# Patient Record
Sex: Female | Born: 1965 | Race: White | Hispanic: No | Marital: Single | Smoking: Former smoker
Health system: Southern US, Community
[De-identification: ages and names within clinical notes are randomized; demographics above are authoritative.]

## PROBLEM LIST (undated history)

## (undated) DIAGNOSIS — K909 Intestinal malabsorption, unspecified: Secondary | ICD-10-CM

## (undated) DIAGNOSIS — IMO0002 Reserved for concepts with insufficient information to code with codable children: Secondary | ICD-10-CM

## (undated) DIAGNOSIS — D508 Other iron deficiency anemias: Secondary | ICD-10-CM

## (undated) DIAGNOSIS — J189 Pneumonia, unspecified organism: Secondary | ICD-10-CM

## (undated) DIAGNOSIS — C799 Secondary malignant neoplasm of unspecified site: Secondary | ICD-10-CM

## (undated) DIAGNOSIS — IMO0001 Reserved for inherently not codable concepts without codable children: Secondary | ICD-10-CM

## (undated) DIAGNOSIS — I201 Angina pectoris with documented spasm: Secondary | ICD-10-CM

## (undated) DIAGNOSIS — Z8489 Family history of other specified conditions: Secondary | ICD-10-CM

## (undated) DIAGNOSIS — C801 Malignant (primary) neoplasm, unspecified: Secondary | ICD-10-CM

## (undated) DIAGNOSIS — M1990 Unspecified osteoarthritis, unspecified site: Secondary | ICD-10-CM

## (undated) DIAGNOSIS — J45909 Unspecified asthma, uncomplicated: Secondary | ICD-10-CM

## (undated) DIAGNOSIS — C787 Secondary malignant neoplasm of liver and intrahepatic bile duct: Secondary | ICD-10-CM

## (undated) DIAGNOSIS — Z923 Personal history of irradiation: Secondary | ICD-10-CM

## (undated) DIAGNOSIS — G4730 Sleep apnea, unspecified: Secondary | ICD-10-CM

## (undated) DIAGNOSIS — K219 Gastro-esophageal reflux disease without esophagitis: Secondary | ICD-10-CM

## (undated) DIAGNOSIS — N390 Urinary tract infection, site not specified: Secondary | ICD-10-CM

## (undated) DIAGNOSIS — T7840XA Allergy, unspecified, initial encounter: Secondary | ICD-10-CM

## (undated) DIAGNOSIS — Z9889 Other specified postprocedural states: Secondary | ICD-10-CM

## (undated) DIAGNOSIS — I10 Essential (primary) hypertension: Secondary | ICD-10-CM

## (undated) DIAGNOSIS — R112 Nausea with vomiting, unspecified: Secondary | ICD-10-CM

## (undated) DIAGNOSIS — C50919 Malignant neoplasm of unspecified site of unspecified female breast: Secondary | ICD-10-CM

## (undated) HISTORY — DX: Personal history of irradiation: Z92.3

## (undated) HISTORY — DX: Intestinal malabsorption, unspecified: K90.9

## (undated) HISTORY — PX: SINUS SURGERY WITH INSTATRAK: SHX5215

## (undated) HISTORY — PX: TONSILLECTOMY: SUR1361

## (undated) HISTORY — DX: Angina pectoris with documented spasm: I20.1

## (undated) HISTORY — PX: CARDIAC CATHETERIZATION: SHX172

## (undated) HISTORY — PX: KNEE SURGERY: SHX244

## (undated) HISTORY — PX: TUBAL LIGATION: SHX77

## (undated) HISTORY — PX: TENDON REPAIR: SHX5111

## (undated) HISTORY — DX: Other iron deficiency anemias: D50.8

## (undated) HISTORY — PX: BREAST BIOPSY WITH SENTINEL LYMPH NODE BIOPSY AND NEEDLE LOCALIZATION: SHX5761

## (undated) HISTORY — DX: Unspecified osteoarthritis, unspecified site: M19.90

## (undated) HISTORY — DX: Unspecified asthma, uncomplicated: J45.909

## (undated) HISTORY — DX: Allergy, unspecified, initial encounter: T78.40XA

## (undated) HISTORY — PX: CHOLECYSTECTOMY: SHX55

## (undated) HISTORY — DX: Essential (primary) hypertension: I10

## (undated) HISTORY — PX: WISDOM TOOTH EXTRACTION: SHX21

---

## 2000-02-21 ENCOUNTER — Encounter: Payer: Self-pay | Admitting: Emergency Medicine

## 2000-02-21 ENCOUNTER — Emergency Department (HOSPITAL_COMMUNITY): Admission: EM | Admit: 2000-02-21 | Discharge: 2000-02-21 | Payer: Self-pay | Admitting: Emergency Medicine

## 2000-03-05 ENCOUNTER — Emergency Department (HOSPITAL_COMMUNITY): Admission: EM | Admit: 2000-03-05 | Discharge: 2000-03-05 | Payer: Self-pay | Admitting: Emergency Medicine

## 2000-03-27 ENCOUNTER — Other Ambulatory Visit: Admission: RE | Admit: 2000-03-27 | Discharge: 2000-03-27 | Payer: Self-pay | Admitting: Obstetrics & Gynecology

## 2000-11-10 ENCOUNTER — Emergency Department (HOSPITAL_COMMUNITY): Admission: EM | Admit: 2000-11-10 | Discharge: 2000-11-10 | Payer: Self-pay | Admitting: Emergency Medicine

## 2000-11-20 ENCOUNTER — Encounter: Admission: RE | Admit: 2000-11-20 | Discharge: 2001-02-18 | Payer: Self-pay | Admitting: Specialist

## 2003-04-08 ENCOUNTER — Other Ambulatory Visit: Admission: RE | Admit: 2003-04-08 | Discharge: 2003-04-08 | Payer: Self-pay | Admitting: Obstetrics & Gynecology

## 2004-04-20 ENCOUNTER — Other Ambulatory Visit: Admission: RE | Admit: 2004-04-20 | Discharge: 2004-04-20 | Payer: Self-pay | Admitting: Obstetrics & Gynecology

## 2004-08-05 ENCOUNTER — Ambulatory Visit (HOSPITAL_COMMUNITY): Admission: RE | Admit: 2004-08-05 | Discharge: 2004-08-05 | Payer: Self-pay | Admitting: Obstetrics & Gynecology

## 2004-11-09 ENCOUNTER — Emergency Department (HOSPITAL_COMMUNITY): Admission: EM | Admit: 2004-11-09 | Discharge: 2004-11-09 | Payer: Self-pay | Admitting: Emergency Medicine

## 2004-11-09 ENCOUNTER — Ambulatory Visit (HOSPITAL_COMMUNITY): Admission: RE | Admit: 2004-11-09 | Discharge: 2004-11-09 | Payer: Self-pay | Admitting: Emergency Medicine

## 2005-04-26 ENCOUNTER — Other Ambulatory Visit: Admission: RE | Admit: 2005-04-26 | Discharge: 2005-04-26 | Payer: Self-pay | Admitting: Obstetrics & Gynecology

## 2006-02-07 ENCOUNTER — Ambulatory Visit (HOSPITAL_COMMUNITY): Admission: RE | Admit: 2006-02-07 | Discharge: 2006-02-07 | Payer: Self-pay | Admitting: Specialist

## 2006-04-06 ENCOUNTER — Ambulatory Visit (HOSPITAL_BASED_OUTPATIENT_CLINIC_OR_DEPARTMENT_OTHER): Admission: RE | Admit: 2006-04-06 | Discharge: 2006-04-06 | Payer: Self-pay | Admitting: Specialist

## 2006-10-23 HISTORY — PX: COLONOSCOPY W/ POLYPECTOMY: SHX1380

## 2007-01-31 ENCOUNTER — Ambulatory Visit (HOSPITAL_COMMUNITY): Admission: RE | Admit: 2007-01-31 | Discharge: 2007-01-31 | Payer: Self-pay | Admitting: Cardiology

## 2007-03-05 ENCOUNTER — Ambulatory Visit (HOSPITAL_COMMUNITY): Admission: RE | Admit: 2007-03-05 | Discharge: 2007-03-05 | Payer: Self-pay | Admitting: Gastroenterology

## 2007-03-05 ENCOUNTER — Encounter (INDEPENDENT_AMBULATORY_CARE_PROVIDER_SITE_OTHER): Payer: Self-pay | Admitting: Specialist

## 2007-08-15 ENCOUNTER — Inpatient Hospital Stay (HOSPITAL_COMMUNITY): Admission: EM | Admit: 2007-08-15 | Discharge: 2007-08-16 | Payer: Self-pay | Admitting: Emergency Medicine

## 2009-05-03 ENCOUNTER — Inpatient Hospital Stay (HOSPITAL_COMMUNITY): Admission: EM | Admit: 2009-05-03 | Discharge: 2009-05-04 | Payer: Self-pay | Admitting: Emergency Medicine

## 2009-06-01 ENCOUNTER — Ambulatory Visit (HOSPITAL_COMMUNITY): Admission: RE | Admit: 2009-06-01 | Discharge: 2009-06-01 | Payer: Self-pay | Admitting: Cardiology

## 2009-06-24 ENCOUNTER — Ambulatory Visit (HOSPITAL_BASED_OUTPATIENT_CLINIC_OR_DEPARTMENT_OTHER): Admission: RE | Admit: 2009-06-24 | Discharge: 2009-06-24 | Payer: Self-pay | Admitting: Cardiology

## 2009-07-02 ENCOUNTER — Ambulatory Visit: Payer: Self-pay | Admitting: Internal Medicine

## 2009-09-15 ENCOUNTER — Ambulatory Visit (HOSPITAL_COMMUNITY): Admission: RE | Admit: 2009-09-15 | Discharge: 2009-09-16 | Payer: Self-pay | Admitting: Otolaryngology

## 2009-09-17 ENCOUNTER — Encounter (INDEPENDENT_AMBULATORY_CARE_PROVIDER_SITE_OTHER): Payer: Self-pay | Admitting: Otolaryngology

## 2010-03-31 ENCOUNTER — Encounter: Admission: RE | Admit: 2010-03-31 | Discharge: 2010-03-31 | Payer: Self-pay | Admitting: Cardiology

## 2011-01-25 LAB — CBC
Hemoglobin: 15.7 g/dL — ABNORMAL HIGH (ref 12.0–15.0)
Platelets: 274 10*3/uL (ref 150–400)
RBC: 4.83 MIL/uL (ref 3.87–5.11)
RDW: 13.2 % (ref 11.5–15.5)
WBC: 12.6 10*3/uL — ABNORMAL HIGH (ref 4.0–10.5)

## 2011-01-25 LAB — BASIC METABOLIC PANEL
Calcium: 9.6 mg/dL (ref 8.4–10.5)
GFR calc Af Amer: >60 mL/min (ref >60)
GFR calc non Af Amer: 58 mL/min — ABNORMAL LOW (ref >60)
Sodium: 138 mEq/L (ref 135–145)

## 2011-01-29 LAB — BASIC METABOLIC PANEL
BUN: 14 mg/dL (ref 6–23)
BUN: 15 mg/dL (ref 6–23)
CO2: 27 mEq/L (ref 19–32)
Calcium: 10 mg/dL (ref 8.4–10.5)
Chloride: 104 mEq/L (ref 96–112)
Creatinine, Ser: 0.88 mg/dL (ref 0.4–1.2)
GFR calc Af Amer: >60 mL/min (ref >60)
GFR calc non Af Amer: >60 mL/min (ref >60)
Glucose, Bld: 105 mg/dL — ABNORMAL HIGH (ref 70–99)
Glucose, Bld: 98 mg/dL (ref 70–99)
Potassium: 3.7 mEq/L (ref 3.5–5.1)

## 2011-01-29 LAB — HEPARIN LEVEL (UNFRACTIONATED): Heparin Unfractionated: <0.1 IU/mL — ABNORMAL LOW (ref 0.30–0.70)

## 2011-01-29 LAB — CBC
HCT: 40.4 % (ref 36.0–46.0)
Hemoglobin: 15.6 g/dL — ABNORMAL HIGH (ref 12.0–15.0)
MCHC: 34.3 g/dL (ref 30.0–36.0)
MCHC: 34.8 g/dL (ref 30.0–36.0)
MCV: 92.3 fL (ref 78.0–100.0)
Platelets: 287 10*3/uL (ref 150–400)
Platelets: 302 10*3/uL (ref 150–400)
RDW: 12.6 % (ref 11.5–15.5)

## 2011-01-29 LAB — COMPREHENSIVE METABOLIC PANEL
AST: 19 U/L (ref 0–37)
Albumin: 4 g/dL (ref 3.5–5.2)
Calcium: 8.7 mg/dL (ref 8.4–10.5)
GFR calc Af Amer: >60 mL/min (ref >60)
Glucose, Bld: 92 mg/dL (ref 70–99)
Potassium: 3.1 mEq/L — ABNORMAL LOW (ref 3.5–5.1)
Sodium: 138 mEq/L (ref 135–145)
Total Bilirubin: 0.6 mg/dL (ref 0.3–1.2)

## 2011-01-29 LAB — CK TOTAL AND CKMB (NOT AT ARMC): Total CK: 70 U/L (ref 7–177)

## 2011-01-29 LAB — DIFFERENTIAL
Eosinophils Relative: 2 % (ref 0–5)
Lymphs Abs: 2.9 10*3/uL (ref 0.7–4.0)
Monocytes Relative: 7 % (ref 3–12)

## 2011-01-29 LAB — LIPID PANEL
Cholesterol: 205 mg/dL — ABNORMAL HIGH (ref 0–200)
LDL Cholesterol: 121 mg/dL — ABNORMAL HIGH (ref 0–99)
Triglycerides: 269 mg/dL — ABNORMAL HIGH (ref <150)
VLDL: 54 mg/dL — ABNORMAL HIGH (ref 0–40)

## 2011-01-29 LAB — POCT CARDIAC MARKERS: Myoglobin, poc: 47.7 ng/mL (ref 12–200)

## 2011-01-29 LAB — MAGNESIUM: Magnesium: 2.2 mg/dL (ref 1.5–2.5)

## 2011-01-29 LAB — TROPONIN I
Troponin I: 0.02 ng/mL (ref 0.00–0.06)
Troponin I: 0.02 ng/mL (ref 0.00–0.06)

## 2011-01-29 LAB — TSH: TSH: 2.792 u[IU]/mL (ref 0.350–4.500)

## 2011-03-07 ENCOUNTER — Encounter (HOSPITAL_BASED_OUTPATIENT_CLINIC_OR_DEPARTMENT_OTHER)
Admission: RE | Admit: 2011-03-07 | Discharge: 2011-03-07 | Disposition: A | Discharge status: Home / Self Care | Payer: 59 | Source: Ambulatory Visit | Attending: Orthopedic Surgery | Admitting: Orthopedic Surgery

## 2011-03-07 LAB — BASIC METABOLIC PANEL
BUN: 9 mg/dL (ref 6–23)
CO2: 22 mEq/L (ref 19–32)
Chloride: 103 mEq/L (ref 96–112)
Creatinine, Ser: 0.65 mg/dL (ref 0.4–1.2)

## 2011-03-07 NOTE — Cardiovascular Report (Signed)
Bianca Kent, Bianca Kent               ACCOUNT NO.:  [id number]   MEDICAL RECORD NO.:  [id number]          PATIENT TYPE:  INP   LOCATION:  2022                         FACILITY:  [facility]   PHYSICIAN:  Darlin Priestly, MD  DATE OF BIRTH:  19-Sep-1966   DATE OF PROCEDURE:  08/15/2007  DATE OF DISCHARGE:                            CARDIAC CATHETERIZATION   PROCEDURE:  1. Left heart catheterization.  2. Coronary angiography.  3. Left ventriculography.   COMPLICATIONS:  None.   INDICATION:  Ms. Frier is a 45 year old female patient of Dr. Lavonne Chick with a history of anxiety, mild asthma, history of a coronary  vasospasm documented by catheterization in June 2008.  She has been on  Norvasc, though she continues to have intermittent episodes of chest  pain which is nitroglycerin responsive.  She was subsequently admitted  on August 11, 2007, again with recurrent chest pain.  She was  subsequently ruled out for myocardial infarction.  She did undergo CT  scan to rule out PE which did reveal a 4-mm nodule in the right mid lung  field with suggestion for repeat CT in 6-9 months.  She has undergone  cardiac catheterization by Dr. Elsie Lincoln in June 2008 that revealed  noncritical CAD but she was noted have coronary vasospasm that time.  She is now brought for repeat catheterization to reassess her coronary  anatomy.   DESCRIPTION OF PROCEDURE:  After giving informed consent the patient was  brought to the cardiac catheterization laboratory.  The right groin was  shaved, prepped and draped in a sterile fashion.  ECG monitoring was  established.  Using the modified Seldinger technique, a #6-French  arterial sheath was inserted in the right femoral artery.  Next, a 4-  [place] JL-3.5 diagnostic catheter was used to perform left coronary  angiogram.   The left main is a medium-size short vessel with no significant disease.   The LAD is a medium to large vessel which coursed the apex and gives  rise to one diagonal branch.  The LAD has no significant disease.   The first diagonal is a medium-size vessel which bifurcates distally  with no significant disease.   The left circumflex is a medium-size vessel coursing the AV groove and  gives rise to two obtuse marginal branch.  The AV groove circumflex has  no significant disease.   The first OM is a medium to large vessel which bifurcates distally with  no significant disease.   The second OM is a small vessel with no significant disease.   The right coronary artery is a large vessel which is dominant.  It gives  rise to a PDA as well as a posterolateral branch.  There is mild 20%  proximal narrowing consistent with probable vasospasm.  There is no  pressure damping.  There is no significant disease in the RCA, PDA, or  posterolateral branch.   Left ventriculogram reveals a preserved EF of 60%.   HEMODYNAMICS:  Systemic arterial pressure 96/64, LV system pressure  98/13, LVEDP of 20.   CONCLUSION:  1. 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  2. Normal left ventricular systolic function.  3. Elevated left ventricular end-diastolic pressure.      Darlin Priestly, MD  Electronically Signed     RHM/MEDQ  D:  08/15/2007  T:  08/16/2007  Job:  [id number]   cc:   Madaline Savage, M.D.

## 2011-03-07 NOTE — Procedures (Signed)
[name], Bianca Kent               ACCOUNT NO.:  [id number]   MEDICAL RECORD NO.:  [id number]          PATIENT TYPE:  OUT   LOCATION:  SLEEP CENTER                 FACILITY:  [facility]   PHYSICIAN:  Clinton D. Maple Hudson, MD, FCCP, FACPDATE OF BIRTH:  12-17-64   DATE OF STUDY:  06/24/2009                            NOCTURNAL POLYSOMNOGRAM   REFERRING PHYSICIAN:  Gaspar Garbe B. Little, M.D.   INDICATION FOR STUDY:  Hypersomnia with sleep apnea.   EPWORTH SLEEPINESS SCORE:  Epworth sleepiness score 14/24, BMI 31.8.  Weight 203 pounds, height 67 inches.  Neck 15 inches.   MEDICATIONS:  Home medications are charted and reviewed.   SLEEP ARCHITECTURE:  Total sleep time 338 minutes with sleep efficiency  83.6%.  Stage I was 8%, stage II 52.2%, stage III absent, REM 39.8% of  total sleep time.  Sleep latency 37.5 minutes, REM latency 34 minutes,  awake after sleep onset 29 minutes, arousal index 21.7.  Bedtime  medication:  Cardizem, Norvasc, Imdur, Aleve, multivitamin.   RESPIRATORY DATA:  Apnea/hypopnea index (AHI) 0.9 per hour.  A total of  5 events were scored, all as hypopneas.  Most events were associated  with supine sleep position.  REM AHI 2.2 per hour.  An additional 10  respiratory effort related arousals were counted, not meeting duration  or intensity criteria to be scored as apneas or hypopneas, but  contributing to a cumulative respiratory disturbance index (RDI) of 2.7  per hour.  There were insufficient number of events to apply CPAP  titration by split protocol on the study night.   OXYGEN DATA:  Mild-to-moderate snoring with oxygen desaturation to a  nadir of 87%.  Mean oxygen saturation through the study was 92.9% on  room air.  A total of 0.2 minutes were recorded with oxygen saturation  less than 88% on room air.   CARDIAC DATA:  Normal sinus rhythm.   MOVEMENT-PARASOMNIA:  No movement disturbance.  Bathroom x1.   IMPRESSIONS-RECOMMENDATIONS:  1. Unremarkable sleep  architecture for sleep center environment except      for increased percentage time spent in REM which can indicate      rebound from previous REM sleep deprivation.  2. Occasional respiratory event with sleep disturbance, AHI 0.9 per      hour), within normal limits, (normal range 0-5 per hour).  Mild-to-      moderate snoring with oxygen desaturation to a nadir of 87% on room      air.      Clinton D. Maple Hudson, MD, FCCP, FACP  Diplomate, Biomedical engineer of Sleep Medicine  Electronically Signed     CDY/MEDQ  D:  07/03/2009 11:40:40  T:  07/04/2009 03:42:20  Job:  [id number]
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
## 2011-03-07 NOTE — Discharge Summary (Signed)
Bianca Kent, Bianca Kent               ACCOUNT NO.:  [id number]   MEDICAL RECORD NO.:  [id number]          PATIENT TYPE:  INP   LOCATION:  2007                         FACILITY:  [facility]   PHYSICIAN:  Thereasa Solo. Little, M.D. DATE OF BIRTH:  December 12, 1965   DATE OF ADMISSION:  05/03/2009  DATE OF DISCHARGE:  05/04/2009                               DISCHARGE SUMMARY   DISCHARGE DIAGNOSES:  1. Chest pain, myocardial infarction ruled out.  2. History of mild vasospasm at catheterization in the past.  3. Anxiety.  4. Pulmonary nodule on CT scan in October 2008, this does need      followup, it was not able to be done this admission because we were      unable to get intravenous access.  5. Normal coronaries at catheterization and normal left ventricular      function this admission.   HOSPITAL COURSE:  The patient is a pleasant 45 year old female who has a  history of mild vasospasm at catheterization in October 2008.  She has  hypertension and anxiety.  She was admitted May 03, 2009 with  increasing chest pain.  She had been taking nitroglycerin for this with  partial relief.  She was admitted to telemetry and started on Lovenox  and nitroglycerin.  Initial troponins were negative.  Catheterization  was done because of continued chest pain on May 03, 2009 that showed  normal coronaries and normal LV function.  We had initially planned on  doing a CT with contrast to rule out pulmonary embolism and also to  followup on her nodule, but we were unable to get IV access.  The  patient is somewhat obese.  We did do a V/Q scan and that was negative  for pulmonary embolism.  We feel the patient can be discharged home on  May 04, 2009.  She will follow up with Dr. Clarene Duke.   LABORATORY DATA:  White count 8.3, hemoglobin 13.8, hematocrit 40.4,  platelets 287, INR 0.9.  Sodium 139, potassium 3.7, BUN 15, creatinine  0.88.  Liver functions were normal.  Hemoglobin A1c is 5.7.  CK-MB and  troponins were  negative.  Cholesterol is 205, triglycerides 269, HDL 30,  LDL 121.  TSH 2.79.  EKG reveals sinus rhythm without acute changes.   DISCHARGE MEDICATIONS:  1. Norvasc 5 mg twice a day.  2. Imdur 60 mg a day.  3. Lasix 40 mg a day.  4. Potassium 20 mEq a day.  5. Multivitamin daily.  6. Aleve two b.i.d. p.r.n.  7. Aspirin 81 mg a day.   DISPOSITION:  The patient is discharged in stable condition.  She will  follow up with Dr. Clarene Duke.  At some point, she will need a followup CT  scan of pulmonary nodules seen in October 2008.      Abelino Derrick, P.A.    ______________________________  Thereasa Solo. Little, M.D.    Bianca Kent  D:  05/18/2009  T:  05/19/2009  Job:  [id number]

## 2011-03-07 NOTE — Discharge Summary (Signed)
Bianca Kent, Bianca Kent               ACCOUNT NO.:  [id number]   MEDICAL RECORD NO.:  [id number]          PATIENT TYPE:  INP   LOCATION:  2022                         FACILITY:  [facility]   PHYSICIAN:  Madaline Savage, M.D.DATE OF BIRTH:  1966-09-23   DATE OF ADMISSION:  08/11/2007  DATE OF DISCHARGE:  08/16/2007                               DISCHARGE SUMMARY   DISCHARGE DIAGNOSES:  1. Coronary vasospasm, status post cardiac catheterization during this      admission, concerning the diagnosis.  2. Acute anxiety, exacerbated by complicated marital situation.      Patient needs intensive outpatient counseling program.  3. Mild bronchial asthma.   This is a 45 year old Caucasian, married female, who presented to the  emergency room at [facility] with complaints of chest pain and  shortness of breath.  In the past, patient was diagnosed with coronary  spasm and had coronary angiography concerning the diagnosis.  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  Patient took two  sublingual nitroglycerin without relief.  In the ER, her heart rate was  78 beats per minute.  She did not have any ischemia, but the patient  said that she started having frequent symptoms of anginal chest pain,  associated with exertion, and every time her episodes of chest pain were  relieved by nitroglycerin, and usually the pain was lasting for two to  three minutes.  Intravenous nitroglycerin was started.  We cycled her  cardiac enzymes that revealed no abnormalities.  Patient also underwent  chest CT to evaluate mildly elevated D-dimer and CT revealed no signs of  pulmonary embolism, but she had subsegmental atelectasis bilaterally in  the lung bases.  A nonspecific 4 mm right  middle lobe nodule was noted  on that CT and followup was recommended in 6-9 months, because patient  has risk factors for lung CA and patient is a smoker.   The next morning, Dr. Elsie Lincoln saw patient and had a lengthy discussion  with her about her family situation and risk factors and he asked Dr.  Jeanie Sewer, in-hospital psychiatrist, to evaluate patient.  Dr. Jeanie Sewer  saw the patient on August 14, 2007, and felt like patient has acute  anxiety disorder, not otherwise specified, rule out post-traumatic  stress disorder.  He recommended continuing Lexapro, add Xanax, and also  he strongly encouraged patient to continue with intensive outpatient  psychiatric counseling program.   Clinical social worker evaluated patient and they were trying to arrange  for her to have a stay outside of her house in a safe place, but the  patient says she does have a safe place and she would go home with her  mother.  Also, information was given her about assistance programs and  numbers provided.   We plan to schedule patient for a stress test because of the  malfunctioning with the equipment in the radiology department and  continuous exertional pain.  Patient underwent coronary angiography.  The angiography was performed by Dr. Jenne Campus on August 15, 2007, and  revealed 20% narrowing of the RCA and coronary spasm.  Medical therapy  was recommended for coronary spasm and noncritical coronary disease.   The next morning, patient was assessed by Dr. Elsie Lincoln.  Urinalysis  revealed signs of urinary tract infection and patient complained of  dysuria.  She was started on Septra-DS during this admission.   DISCHARGE DIAGNOSES:  1. Coronary angiospasm - confirmed by angiography.  2. Acute anxiety, not otherwise specified, status post psychiatric      consult with Dr. Jeanie Sewer.  3. Mild bronchial asthma.  4. Urinary tract infection.  Patient was started on Septra.   LABORATORY DATA:  As I mentioned, her  cardiac enzymes were negative  times three.  Sodium was 138, potassium 43, chloride 106, CO2 27,  glucose 97, BUN 11, creatinine 0.84.  White blood cell count 8.6,  hemoglobin 13.5, hematocrit 39.8, platelet count 303, D-dimer 0.53, and  total cholesterol 192, triglycerides 142, cholesterol HDL 28 and LDL  136.   DISCHARGE MEDICATIONS:  1. Aspirin 81 mg daily.  2. Norvasc 5 mg daily.  3. Nicotine patch 14 mg daily.  4. Lexapro 20 mg daily.  5. Xanax 0.25 mg up to three times daily as needed.  6. Septra-DS one pill b.i.d. for five days.   FOLLOWUP:  Dr. Truett Perna office will call patient to schedule followup  appointment.  Patient needs to follow up lung nodule in six to nine  months with repeat CT and we will refer her to primary care physician,  Dr. Shaune Pollack, for addressing this issue.      Raymon Mutton, P.A.    ______________________________  Madaline Savage, M.D.    MK/MEDQ  D:  08/16/2007  T:  08/16/2007  Job:  [id number]   cc:   Duncan Dull, M.D.

## 2011-03-07 NOTE — Cardiovascular Report (Signed)
NAMESELA, FALK NO.:  [id number]   MEDICAL RECORD NO.:  [id number]          PATIENT TYPE:  INP   LOCATION:  2007                         FACILITY:  [facility]   PHYSICIAN:  Nicki Guadalajara, M.D.     DATE OF BIRTH:  [birth year]   DATE OF PROCEDURE:  DATE OF DISCHARGE:                            CARDIAC CATHETERIZATION   INDICATIONS:  Ms. 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  There was a remote history of  tobacco use.  Apparently cardiac catheterization on October 2008  revealed mild vasospasm.  She was admitted to [facility] last  night with recurrent chest pain.  She was treated with IV nitroglycerin,  Lovenox and was continued on Norvasc.  Cardiac enzymes and markers were  negative.  She was seen by Dr. Clarene Duke today and cardiac catheterization  was recommended.   PROCEDURE:  After premedication with Valium 5 mg intravenously, the  patient was prepped and draped in usual fashion.  Her right femoral  artery was punctured anteriorly and a 5-French sheath was inserted  without difficulty.  Initially, 5-French Judkins 4 left catheter was  inserted, but this was not able to selectively engage the left main and  this catheter was then substituted for 5-French 3.5 curve left  diagnostic catheter.  Selective angiography in the left coronary system  was performed.  A 5-French FR4 catheter was used for selective  angiography in the right coronary artery.  A 5-French pigtail catheter  was used for biplane cine left ventriculography.  Hemostasis was  obtained by direct manual pressure.  The patient tolerated the procedure  well.   HEMODYNAMIC DATA:  Central aortic pressure was 105/70.  Left ventricular  pressure was 105/7.   ANGIOGRAPHIC DATA:  Left main coronary was very short and immediately  bifurcated into an LAD and left circumflex system.   The LAD was angiographically normal and gave rise to a  moderate-sized  diagonal vessel, which had like a twin diagonal LAD system.  The LAD and  its branches were free of significant disease.   The circumflex vessel was angiographically normal and gave rise two  marginal vessels.   The right coronary artery was angiographically normal and ended in the  PDA and posterolateral vessel.   Biplane cine left ventriculography revealed normal LV contractility  without focal segmental wall motion abnormalities.   IMPRESSION:  1. Normal left ventricular function.  2. Normal coronary arteries.           ______________________________  Nicki Guadalajara, M.D.     TK/MEDQ  D:  05/03/2009  T:  05/04/2009  Job:  [id number]   cc:   Madaline Savage, M.D.  Thereasa Solo. Little, M.D.

## 2011-03-07 NOTE — Op Note (Signed)
Bianca Kent, WOODSTOCK               ACCOUNT NO.:  [id number]   MEDICAL RECORD NO.:  [id number]          PATIENT TYPE:  AMB   LOCATION:  ENDO                         FACILITY:  [facility]   PHYSICIAN:  Shirley Friar, MDDATE OF BIRTH:  Oct 23, 1966   DATE OF PROCEDURE:  03/05/2007  DATE OF DISCHARGE:                               OPERATIVE REPORT   OPERATION/PROCEDURE:  Upper endoscopy.   INDICATIONS:  Chest pain.   MEDICATIONS:  Fentanyl 25 mg IV, Valium 6 mg IV, additional medicine  given for preceding colonoscopy.   FINDINGS:  The endoscope was inserted into the oropharynx and esophagus  was intubated.  The gastroesophageal junction was noted be at  approximately 36 cm from the incisors.  The endoscope was advanced into  the stomach where there were linear erythematous erosions and  subepithelial hemorrhages consistent with antral gastritis.  The are was  biopsied x1.  Retroflexion was done which revealed normal proximal  stomach and angularis.  Endoscope was straightened, advanced into the  duodenal bulb and second portion of duodenum which were both normal.  The endoscope was then withdrawn to confirm that the GE junction was  noted at 36 cm from incisors as previously noted.  Endoscope was then  withdrawn completely.  The Bravo capsule catheter was inserted and  attached in the usual fashion at 30 cm which is 6 cm above the GE  junction.  The endoscope was reinserted to confirm adequate placement of  the Bravo capsule.   ASSESSMENT:  1. Antral gastritis, status post biopsy.  2. Successful placement of Bravo capsule 6 cm above the      gastroesophageal junction (capsule placement 30 cm from incisors).   PLAN:  1. Follow up on Bravo capsule reading.  2. Follow up on biopsies.      Shirley Friar, MD  Electronically Signed     VCS/MEDQ  D:  03/05/2007  T:  03/06/2007  Job:  [id number]   cc:   Duncan Dull, M.D.  Madaline Savage, M.D.  Jonita Albee, M.D.

## 2011-03-07 NOTE — Op Note (Signed)
NAMERYELYNN, GUEDEA               ACCOUNT NO.:  [id number]   MEDICAL RECORD NO.:  [id number]          PATIENT TYPE:  AMB   LOCATION:  ENDO                         FACILITY:  [facility]   PHYSICIAN:  Shirley Friar, MDDATE OF BIRTH:  October 21, 1966   DATE OF PROCEDURE:  03/05/2007  DATE OF DISCHARGE:                               OPERATIVE REPORT   OPERATION/PROCEDURE:  Colonoscopy.   INDICATIONS:  Family history of colon polyps.   MEDICATIONS:  Fentanyl 125 mcg IV, Phenergan 25 mg IV, Valium 4 mg IV.   FINDINGS:  Rectal exam was normal.  An adult Pentax colonoscope was  inserted into a well-prepped colon, advanced to the cecum where the  ileocecal valve and appendiceal orifice were identified.  On insertion  of the colonoscope, there was a 5 cm long linear ulcerated area with  mild edema and erythema that was seen in the descending colon.  This  area was again visualized on withdrawal and was biopsied multiple times.  Prior to withdrawal, the terminal ileum was intubated and was normal in  appearance.  No other mucosal abnormalities were noted except for this 5  cm long segment in the descending colon.  A 2 mm semisessile  rectosigmoid polyp was removed with hot biopsy.  Retroflexion was  unremarkable.   IMPRESSION:  1. Ulcerated fold in descending colon, concerning for ischemia, status      post biopsy.  2. Diminutive polyp in rectosigmoid, status post hot biopsy.   PLAN:  Follow up on path.      Shirley Friar, MD     VCS/MEDQ  D:  03/05/2007  T:  03/06/2007  Job:  [id number]   cc:   Duncan Dull, M.D.  Madaline Savage, M.D.

## 2011-03-08 ENCOUNTER — Ambulatory Visit (HOSPITAL_BASED_OUTPATIENT_CLINIC_OR_DEPARTMENT_OTHER)
Admission: RE | Admit: 2011-03-08 | Discharge: 2011-03-08 | Disposition: A | Discharge status: Home / Self Care | Payer: 59 | Source: Ambulatory Visit | Attending: Orthopedic Surgery | Admitting: Orthopedic Surgery

## 2011-03-08 DIAGNOSIS — F411 Generalized anxiety disorder: Secondary | ICD-10-CM | POA: Insufficient documentation

## 2011-03-08 DIAGNOSIS — S61209A Unspecified open wound of unspecified finger without damage to nail, initial encounter: Secondary | ICD-10-CM | POA: Insufficient documentation

## 2011-03-08 DIAGNOSIS — G4730 Sleep apnea, unspecified: Secondary | ICD-10-CM | POA: Insufficient documentation

## 2011-03-08 DIAGNOSIS — Z01812 Encounter for preprocedural laboratory examination: Secondary | ICD-10-CM | POA: Insufficient documentation

## 2011-03-08 DIAGNOSIS — I10 Essential (primary) hypertension: Secondary | ICD-10-CM | POA: Insufficient documentation

## 2011-03-08 DIAGNOSIS — X58XXXA Exposure to other specified factors, initial encounter: Secondary | ICD-10-CM | POA: Insufficient documentation

## 2011-03-10 NOTE — Cardiovascular Report (Signed)
[name], Kent               ACCOUNT NO.:  [id number]   MEDICAL RECORD NO.:  [id number]          PATIENT TYPE:  OIB   LOCATION:  2899                         FACILITY:  [facility]   PHYSICIAN:  Madaline Savage, M.D.DATE OF BIRTH:  04-28-1966   DATE OF PROCEDURE:  01/31/2007  DATE OF DISCHARGE:                            CARDIAC CATHETERIZATION   PROCEDURES PERFORMED:  1. Selective coronary angiography by Judkins technique.  2. Retrograde left heart catheterization.  3. Left ventricular angiography.  4. Aortic root and ascending aortogram.  Interventions none.  5. Intra right coronary artery nitroglycerin administration for      suspected spasm.   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  She has had  negative stress testing with Persantine Myoview and has a normal LVEF.  She has an elevated blood pressure.  She has a family history of early  coronary disease and is a smoker.  Because of continuing chest pain with  a known negative stress test, we are bringing her to the cath lab today  to make sure she does not have balance ischemia.  This case was  performed on an outpatient basis electively without any complications.   RESULTS:  Pressures:  Left ventricular pressure showed LV pressure  115/4, end-diastolic pressure 17.  Central aortic pressure 115/70, mean  of 90.  No aortic valve gradient by pullback technique.  Angiographically, all coronary arteries were normal.  There was a  question of a lesion in the proximal RCA, but it proved to be catheter  spasm because it reversed beautifully with 200 mcg of intracoronary  nitroglycerin.  LV angiogram was normal in terms of wall motion and EF  was 60%.  No mitral regurgitation seen.   Aortic root angiography and ascending aorta angiography showed no  evidence of dissection.   The descending portion of the aorta was faintly  visualized but also appeared normal.   FINAL DIAGNOSIS:  1. Angiographically patent coronary arteries.  2. Catheter induced right coronary spasm, reversed nicely with      intracoronary nitroglycerin.  3. Normal LV systolic function.  4. Normal aortogram.   PLAN:  The patient will be recovered in the holding area of the day  hospital and will be counseled about smoking cessation again, and I will  treat this pain as possible coronary spasm induced by tobacco use.           ______________________________  Madaline Savage, M.D.     WHG/MEDQ  D:  01/31/2007  T:  01/31/2007  Job:  [id number]   cc:   Madaline Savage, M.D.  Cath Lab, [facility]

## 2011-03-10 NOTE — Op Note (Signed)
NAMEJERZI, TIGERT               ACCOUNT NO.:  [id number]   MEDICAL RECORD NO.:  [id number]          PATIENT TYPE:  AMB   LOCATION:  ENDO                         FACILITY:  [facility]   PHYSICIAN:  Shirley Friar, MDDATE OF BIRTH:  1965/11/03   DATE OF PROCEDURE:  DATE OF DISCHARGE:  03/05/2007                               OPERATIVE REPORT   INDICATION:  Chest pain.   FINDINGS:  DeMeester score day #1 of 5.2 with normal being less than  14.72.  DeMeester score on day #2 of 4.5 with normal being less than  14.72.   IMPRESSION:  Negative Bravo capsule 48 hour pH study.  Chest pain is  unrelated to pH less than 4.0.  She also no objective evidence of acidic  reflux.   PLAN:  Continue measurement from a cardiology standpoint regarding chest  pain.  No evidence that acidic reflux is contributing to chest pain and  no evidence of acidic reflux on this pH study.      Shirley Friar, MD  Electronically Signed     VCS/MEDQ  D:  03/25/2007  T:  03/25/2007  Job:  [id number]   cc:   Duncan Dull, M.D.  Madaline Savage, M.D.

## 2011-03-10 NOTE — Op Note (Signed)
Bianca Kent, BILGER               ACCOUNT NO.:  [id number]   MEDICAL RECORD NO.:  [id number]          PATIENT TYPE:  AMB   LOCATION:  SDC                           FACILITY:  [facility]   PHYSICIAN:  Gerrit Friends. Aldona Bar, M.D.   DATE OF BIRTH:  [birth year]   DATE OF PROCEDURE:  08/05/2004  DATE OF DISCHARGE:                                 OPERATIVE REPORT   PREOPERATIVE DIAGNOSES:  Desire for permanent elective sterilization.   POSTOPERATIVE DIAGNOSES:  Desire for permanent elective sterilization.   PROCEDURE:  Laparoscopic tubal coagulation for permanent elective  sterilization.   SURGEON:  Gerrit Friends. Aldona Bar, M.D.   ANESTHESIA:  General endotracheal.   HISTORY:  This 45 year old, nulligravid female who has recently been on  depot Provera is not desirous of pregnancy at any time and desires a  permanent elective sterilization procedure. She understands that the  procedure is meant to be permanent but unfortunately is not 100% perfect--  subsequent pregnancy can result. According to her wishes, she is now taken  to the operating room for such a procedure.   DESCRIPTION OF PROCEDURE:  The patient was taken to the operating room where  after satisfactory induction of general anesthesia, she was prepped and  draped and then placed in the modified lithotomy position in the short Allen  stirrups.  She was prepped and draped and during the prep, the bladder was  drained of clear urine with a red rubber catheter in the in and out fashion  and a Hulka tenaculum placed on the cervix for uterine mobility during the  procedure.   Once the patient was adequately draped, our procedure was begun. A 1 cm  subumbilical midline transverse skin incision was made and through this the  large trocar and sleeve was introduced without difficulty. The large trocar  was removed and through the large sleeve, the laparoscope was introduced  with good visualization of the intraabdominal structures. At this time, a  pneumoperitoneum was created with approximately 3 liters of carbon dioxide  gas. Inspection of the abdomen and pelvis was then carried out. The liver  edge and surface of the diaphragm appeared normal, appendix was retrocecal  and difficult to visualize. The tubes and ovaries appeared completely normal  as did the uterus, dome of the bladder and cul-de-sac.   At this time under direct visualization through a 5 mm suprapubic midline  transverse skin incision, the accessory trocar and sleeve was introduced.  The accessory trocar was removed and through the accessory sleeve, the  bipolar coagulating instrument was introduced after being appropriately  tested. At this time, the right fallopian tube was identified, traced out to  the fimbriated end for positive identification and in the mid portion of the  right fallopian tube an area measuring approximately 2 cm was adequately  coagulated.  A similar procedure was carried out on the left fallopian tube.  At this time with an adequate area of coagulation noted at each fallopian  tube but no pathology appreciated, the procedure was felt to be complete.  The bipolar coagulating instrument was removed and the accessory  sleeve was  removed under direct visualization. The under surface of the peritoneum was  dry. At this time, the laparoscope was removed, pneumoperitoneum reduced and  large sleeve was removed. The skin incisions were closed with 4-0 Vicryl in  an interrupted subcuticular fashion and dressings were applied. The Hulka  tenaculum was removed from the patient's cervix and the patient was  thereafter transferred to the recovery room in satisfactory condition having  tolerated the procedure well. Estimated blood loss  negligible. All counts  correct x2.  Pathologic specimens none.   The patient will be discharged to home with an appropriate instruction sheet  and a prescription for Tylox to use 1-2 q.4-6 h. as needed for severe pain  and  Zofran 8 mg tablet use 1 every 6-8h as needed for nausea and vomiting.  She will be asked to return to the office in approximately 2-3 weeks time or  as needed. Appropriate instructions will be provided per discharge  instruction sheet. Condition on arrival to recovery room satisfactory.      RMW/MEDQ  D:  08/05/2004  T:  08/05/2004  Job:  [id number]

## 2011-03-10 NOTE — Op Note (Signed)
Bianca Kent, Bianca Kent               ACCOUNT NO.:  [id number]   MEDICAL RECORD NO.:  [id number]          PATIENT TYPE:  AMB   LOCATION:  DSC                          FACILITY:  [facility]   PHYSICIAN:  Jene Every, M.D.    DATE OF BIRTH:  1966-01-09   DATE OF PROCEDURE:  04/06/2006  DATE OF DISCHARGE:                                 OPERATIVE REPORT   PREOPERATIVE DIAGNOSIS:  Medial meniscus tear, right knee; chondromalacia of  the patellofemoral joint.   POSTOPERATIVE DIAGNOSIS:  Medial meniscus tear, right knee; chondromalacia  of the patellofemoral joint.   PROCEDURE PERFORMED:  Right knee arthroscopy, partial medial meniscectomy,  chondroplasty of the patella, medial femoral condyle.   BRIEF INDICATION:  A 45 year old with knee pain.  MRI indicated possible  meniscus tear and chondromalacia.  Operative intervention was indicated for  diagnosis.  The treatment risks had been discussed with bleeding, infection,  no change in symptoms, worsening in symptoms, need for repeat debridement,  etc.   PROCEDURE:  With the patient in the supine position after induction of  adequate general anesthesia with 1 gram of Kefzol, the left lower extremity  was prepped and draped in the usual sterile fashion.  A lateral parapatellar  portal  and a superomedial parapatellar portal were fashioned with a number  11 blade and then __________  cannula atraumatically placed.  Irrigation of  wound utilized inside the joint.  Under direct visualization, a medial  parapatellar portal was fashioned with a number 11 blade after localization  with an 18 gauge needle sparing medial meniscus.  A small medial meniscus  tear was noted.  This was anterior horn.  A shaver was introduced utilizing  a Shave-It to debrided it to a stable base.  There was chondromalacia of the  patellofemoral joint.  A chondroplasty of the patella and the femoral  condyle was performed.  This was the anterior weight bearing surface.   The  remainder of the meniscus was stable to probe and palpation medially and  laterally.  The lateral femoral condyle and tibial plateau was stable to  probe and palpation without evidence of tear or significant chondromalacia.  She had normal patellofemoral tracking.   The gutters were unremarkable.  The ACL and PCL were unremarkable as well.  No residual meniscus tear with probe and palpation.  Next, after the knee  was copiously lavaged, all instrumentation was removed.  The patella  __________  stitches; 0.25% Marcaine with epinephrine was infiltrated in the  joint.  The wound was dressed sterilely, secured with an Ace bandage.  The  patient was then extubated without difficulty and transported to the  recovery room in satisfactory condition.   The patient tolerated the procedure well.  There were no complications.      Jene Every, M.D.     Cordelia Pen  D:  04/06/2006  T:  04/06/2006  Job:  [id number]

## 2011-03-18 NOTE — Op Note (Signed)
Bianca, Kent               ACCOUNT NO.:  [id number]  MEDICAL RECORD NO.:  [id number]            PATIENT TYPE:  LOCATION:                                 FACILITY:  PHYSICIAN:  Madelynn Done, MD       DATE OF BIRTH:  DATE OF PROCEDURE:  03/08/2011 DATE OF DISCHARGE:                              OPERATIVE REPORT   PREOPERATIVE DIAGNOSIS:  Left index finger laceration with tendon involvement.  POSTOPERATIVE DIAGNOSIS:  Left index finger laceration with tendon involvement and also had left index finger open proximal interphalangeal joint laceration with joint involvement.  ANESTHESIA:  A 1% Xylocaine with 0.25% Marcaine local block with IV sedation.  SURGICAL PROCEDURES: 1. Left index finger extensor tendon repair, primary tendon repair,     zone 3. 2. Left index finger proximal interphalangeal joint arthrotomy and     synovectomy.  INTRAOPERATIVE FINDINGS:  The patient did have a greater than 60% laceration of the extensor tendon at the level of zone 3 which is extending into the joint.  The patient did have a moderate amount of fluid within the joint and synovitis within the joint from a traumatic injury.  SURGICAL INDICATIONS:  Ms. Bianca Kent is a 45 year old right-hand-dominant female who has sustained a traumatic injury to her left index finger. The patient sustained an open wound and she allowed it to heal on her own.  She had persistent pain over 8 weeks and based on her degree of symptoms, she elected to undergo the above procedure.  Risks, benefits, and alternatives were discussed in detail with the patient, signed informed consent was obtained.  Risks included, but not limited to bleeding, infection, damage to nearby nerves, arteries, or tendons, loss motion of the wrist and digits, need for further surgical intervention.  DESCRIPTION OF THE PROCEDURE:  The patient was properly identified in the preop holding area, mark with a permanent marker was made on  the left index finger to indicate correct operative site.  The patient was then brought back to the operating room and placed supine on the anesthesia table where the IV sedation was administered.  The patient tolerated this well.  Local anesthetic was administered.  The patient tolerated this well.  A well-padded tourniquet was then placed on the left forearm and sealed with 1000 drape.  The left upper extremity was prepped and draped in normal sterile fashion.  Time-out was called, the correct side was identified and the procedure was then begun.  Attention was then turned to the left index finger where the limb was then elevated using Esmarch exsanguination and tourniquet was insufflated.  A longitudinal incision was made, particularly over the dorsal aspect of the left index finger at the proximal interphalangeal joint.  Dissection was then carried down through the skin and subcutaneous tissues.  Skin flaps were then raised proximal, radial, and ulnarly.  The extensor tendon was then exposed.  The patient did have a 60% laceration at the level of zone 3 just prior to the insertion of the central slip.  Once this was carried out, the slit was then opened up.  The joint was then opened up and extended all the way down into the joint.  The synovitic tissue and debris was removed from the joint.  Copious irrigation and joint debridement was then carried out after exploration and lavage of the joint.  Following this, extensor tendon was then repaired using a 4- 0 Mersilene figure-of-eight suture.  The wounds were then thoroughly irrigated.  The tourniquet was deflated.  Hemostasis was obtained.  The skin was then closed using horizontal mattress and nylon suture. Adaptic dressing and sterile compressive bandage was applied.  The patient was placed in a finger splint and Coban wrap.  The patient was then taken to recovery room in good condition.  POSTOPERATIVE PLAN:  The patient will be  discharged home.  I will see her back in the office in approximately 10 days for wound check, suture removal, and begin a zone 3 extensor tendon rehab protocol.     Madelynn Done, MD     FWO/MEDQ  D:  03/08/2011  T:  03/09/2011  Job:  [id number]  Electronically Signed by Bradly Bienenstock IV MD on 03/18/2011 11:56:17 AM

## 2011-03-29 ENCOUNTER — Ambulatory Visit: Payer: 59 | Attending: Orthopedic Surgery | Admitting: Occupational Therapy

## 2011-03-29 DIAGNOSIS — IMO0001 Reserved for inherently not codable concepts without codable children: Secondary | ICD-10-CM | POA: Insufficient documentation

## 2011-03-29 DIAGNOSIS — M25549 Pain in joints of unspecified hand: Secondary | ICD-10-CM | POA: Insufficient documentation

## 2011-03-29 DIAGNOSIS — M25649 Stiffness of unspecified hand, not elsewhere classified: Secondary | ICD-10-CM | POA: Insufficient documentation

## 2011-04-03 ENCOUNTER — Ambulatory Visit: Payer: 59 | Admitting: Occupational Therapy

## 2011-04-10 ENCOUNTER — Ambulatory Visit: Payer: 59 | Admitting: Occupational Therapy

## 2011-04-12 ENCOUNTER — Ambulatory Visit: Payer: 59 | Admitting: Occupational Therapy

## 2011-04-18 ENCOUNTER — Ambulatory Visit: Payer: 59 | Admitting: Occupational Therapy

## 2011-04-20 ENCOUNTER — Ambulatory Visit: Payer: 59 | Admitting: Occupational Therapy

## 2011-04-24 ENCOUNTER — Ambulatory Visit: Payer: 59 | Attending: Orthopedic Surgery | Admitting: Occupational Therapy

## 2011-04-24 DIAGNOSIS — M25649 Stiffness of unspecified hand, not elsewhere classified: Secondary | ICD-10-CM | POA: Insufficient documentation

## 2011-04-24 DIAGNOSIS — M25549 Pain in joints of unspecified hand: Secondary | ICD-10-CM | POA: Insufficient documentation

## 2011-04-24 DIAGNOSIS — IMO0001 Reserved for inherently not codable concepts without codable children: Secondary | ICD-10-CM | POA: Insufficient documentation

## 2011-04-27 ENCOUNTER — Ambulatory Visit: Payer: 59 | Admitting: Occupational Therapy

## 2011-05-01 ENCOUNTER — Ambulatory Visit: Payer: 59 | Admitting: Occupational Therapy

## 2011-05-03 ENCOUNTER — Ambulatory Visit: Payer: 59 | Admitting: Occupational Therapy

## 2011-05-16 ENCOUNTER — Ambulatory Visit: Payer: 59 | Admitting: Occupational Therapy

## 2011-05-18 ENCOUNTER — Encounter: Payer: 59 | Admitting: Occupational Therapy

## 2011-05-23 ENCOUNTER — Encounter: Payer: 59 | Admitting: Occupational Therapy

## 2011-05-25 ENCOUNTER — Encounter: Payer: 59 | Admitting: Occupational Therapy

## 2011-08-02 LAB — URINALYSIS, ROUTINE W REFLEX MICROSCOPIC
Glucose, UA: NEGATIVE
Nitrite: POSITIVE — AB
Specific Gravity, Urine: 1.007
pH: 8

## 2011-08-02 LAB — DIFFERENTIAL
Basophils Relative: 1
Eosinophils Absolute: 0.1
Lymphs Abs: 2.4
Monocytes Absolute: 0.8 — ABNORMAL HIGH
Monocytes Relative: 9
Neutro Abs: 4.9

## 2011-08-02 LAB — POCT CARDIAC MARKERS
CKMB, poc: 1
CKMB, poc: <1 — ABNORMAL LOW
Myoglobin, poc: 58.5
Myoglobin, poc: 77.5
Operator id: 284251
Troponin i, poc: <0.05
Troponin i, poc: <0.05
Troponin i, poc: <0.05

## 2011-08-02 LAB — COMPREHENSIVE METABOLIC PANEL
Albumin: 3.9
Alkaline Phosphatase: 124 — ABNORMAL HIGH
BUN: 11
Calcium: 8.8
Creatinine, Ser: 0.79
Glucose, Bld: 86
Potassium: 4.1
Total Protein: 6.6

## 2011-08-02 LAB — CK TOTAL AND CKMB (NOT AT ARMC)
CK, MB: 1.1
CK, MB: 1.2
Relative Index: INVALID
Total CK: 58

## 2011-08-02 LAB — BASIC METABOLIC PANEL
BUN: 11
Calcium: 9.1
GFR calc non Af Amer: >60
Potassium: 4.3
Sodium: 138

## 2011-08-02 LAB — I-STAT 8, (EC8 V) (CONVERTED LAB)
BUN: 13
Glucose, Bld: 85
Hemoglobin: 16 — ABNORMAL HIGH
Potassium: 3.9
TCO2: 21
pH, Ven: 7.504 — ABNORMAL HIGH

## 2011-08-02 LAB — URINE CULTURE: Colony Count: >=100000

## 2011-08-02 LAB — CBC
HCT: 39.8
Hemoglobin: 13.5
Hemoglobin: 14.6
MCHC: 33.5
MCHC: 33.8
MCV: 90.9
MCV: 91.9
Platelets: 262
Platelets: 303
RBC: 4.27
RBC: 4.74
RDW: 13.1
RDW: 13.1
WBC: 8.3
WBC: 8.6
WBC: 8.8

## 2011-08-02 LAB — LIPID PANEL
Cholesterol: 192
HDL: 28 — ABNORMAL LOW
Triglycerides: 142

## 2011-08-02 LAB — DRUG SCREEN PANEL (SERUM)
Benzodiazepine Scrn: NEGATIVE
Cannabinoid, Blood: NEGATIVE
Cocaine (Metabolite): NEGATIVE
Opiates, Blood: NEGATIVE
Propoxyphene,Serum: NEGATIVE

## 2011-08-02 LAB — URINE MICROSCOPIC-ADD ON

## 2011-08-02 LAB — PROTIME-INR: INR: 0.9

## 2011-08-02 LAB — TSH: TSH: 1.985

## 2011-08-02 LAB — TROPONIN I: Troponin I: 0.03

## 2011-08-02 LAB — CARDIAC PANEL(CRET KIN+CKTOT+MB+TROPI)
Relative Index: 1
Total CK: 108
Troponin I: 0.02

## 2011-08-02 LAB — MAGNESIUM: Magnesium: 2.3

## 2012-03-04 ENCOUNTER — Ambulatory Visit (INDEPENDENT_AMBULATORY_CARE_PROVIDER_SITE_OTHER): Payer: 59 | Admitting: Physician Assistant

## 2012-03-04 VITALS — BP 126/86 | HR 82 | Temp 98.2°F | Resp 16 | Ht 65.5 in | Wt 218.0 lb

## 2012-03-04 DIAGNOSIS — M62838 Other muscle spasm: Secondary | ICD-10-CM

## 2012-03-04 MED ORDER — HYDROCODONE-ACETAMINOPHEN 5-325 MG PO TABS: 1.0000 | ORAL_TABLET | ORAL | Status: DC | PRN

## 2012-03-04 MED ORDER — HYDROCODONE-ACETAMINOPHEN 5-325 MG PO TABS: 1.0000 | ORAL_TABLET | ORAL | Status: AC | PRN

## 2012-03-04 MED ORDER — MELOXICAM 15 MG PO TABS: 15.0000 mg | ORAL_TABLET | Freq: Every day | ORAL | Status: DC

## 2012-03-04 MED ORDER — MELOXICAM 15 MG PO TABS: 15.0000 mg | ORAL_TABLET | Freq: Every day | ORAL | Status: AC

## 2012-03-04 MED ORDER — CYCLOBENZAPRINE HCL 10 MG PO TABS: 10.0000 mg | ORAL_TABLET | Freq: Three times a day (TID) | ORAL | Status: AC | PRN

## 2012-03-04 MED ORDER — CYCLOBENZAPRINE HCL 10 MG PO TABS: 10.0000 mg | ORAL_TABLET | Freq: Three times a day (TID) | ORAL | Status: DC | PRN

## 2012-03-04 NOTE — Progress Notes (Signed)
  Subjective:    Patient ID: Bianca Kent, female    DOB: Nov 15, 1965, 46 y.o.   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  No paraesthesias or weakness but does have pain with ROM of shoulder and neck. She says she feels like her neck is tired and wants to hold it up. She has taken tylenol, ibuprofen, aleeve without relief.  She has used ice and heat.      Review of Systems As above    Objective:   Physical Exam  Constitutional: She is oriented to person, place, and time.  Musculoskeletal:       Left shoulder: She exhibits decreased range of motion, pain and spasm. She exhibits no swelling. Tenderness: trapezius.       Cervical back: She exhibits decreased range of motion, tenderness and spasm.       Tender left paraspinal muscles with palpable spasm in trapezius.  Neurological: She is alert and oriented to person, place, and time. She has normal strength and normal reflexes.       5/5 grip strength  Skin: Skin is warm. No ecchymosis noted. No erythema.          Assessment & Plan:  Trapezius spasm Cervical spasm  Soft cervical collar for comfort.  Hydrocodone, meloxicam, flexeril.  Heat.  Gentle ROM exercises.  Rest.  Pt is off work until Friday night.  If no significant improvement in 48 hours, consider PT.

## 2012-03-18 ENCOUNTER — Ambulatory Visit (INDEPENDENT_AMBULATORY_CARE_PROVIDER_SITE_OTHER): Payer: 59 | Admitting: Physician Assistant

## 2012-03-18 ENCOUNTER — Encounter: Payer: Self-pay | Admitting: Physician Assistant

## 2012-03-18 VITALS — BP 109/76 | HR 86 | Temp 98.3°F | Resp 16 | Ht 65.75 in | Wt 217.4 lb

## 2012-03-18 DIAGNOSIS — R059 Cough, unspecified: Secondary | ICD-10-CM

## 2012-03-18 DIAGNOSIS — R066 Hiccough: Secondary | ICD-10-CM

## 2012-03-18 DIAGNOSIS — J310 Chronic rhinitis: Secondary | ICD-10-CM

## 2012-03-18 DIAGNOSIS — J019 Acute sinusitis, unspecified: Secondary | ICD-10-CM

## 2012-03-18 DIAGNOSIS — R05 Cough: Secondary | ICD-10-CM

## 2012-03-18 MED ORDER — GUAIFENESIN ER 1200 MG PO TB12: 1.0000 | ORAL_TABLET | Freq: Two times a day (BID) | ORAL | Status: DC | PRN

## 2012-03-18 MED ORDER — CEFDINIR 300 MG PO CAPS: 600.0000 mg | ORAL_CAPSULE | Freq: Every day | ORAL | Status: AC

## 2012-03-18 MED ORDER — IPRATROPIUM BROMIDE 0.03 % NA SOLN: 2.0000 | Freq: Two times a day (BID) | NASAL | Status: DC

## 2012-03-18 MED ORDER — HYDROCOD POLST-CHLORPHEN POLST 10-8 MG/5ML PO LQCR: 5.0000 mL | Freq: Two times a day (BID) | ORAL | Status: DC | PRN

## 2012-03-18 NOTE — Progress Notes (Signed)
  Subjective:    Patient ID: Bianca Kent, female    DOB: Mar 08, 1966, 46 y.o.   MRN: [id number]  HPI  ST began 3 days ago.  Stuffy head, ear ache.  OTC Nyquil and Dayquil, Mucinex, NetiPot without significant improvement.. "I have learned that if I wait more than a few days, it gets really bad."  Previous sinus surgery to improve drainage.  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  No fever, chills, GI/GU symptoms. Cough keeping her awake.  Review of Systems As above.    Objective:   Physical Exam  Vital signs noted. Well-developed, well nourished WF who is awake, alert and oriented, in NAD. HEENT: [place]/AT, PERRL, EOMI.  Sclera and conjunctiva are clear.  EAC are patent, TMs are normal in appearance. Nasal mucosa is pink and moist. OP is clear. Maxillary sinus tenderness. Neck: supple, non-tender, no lymphadenopathy, thyromegaly. Heart: RRR, no murmur Lungs: CTA Extremities: no cyanosis, clubbing or edema. Skin: warm and dry without rash.       Assessment & Plan:   1. Rhinitis  ipratropium (ATROVENT) 0.03 % nasal spray, Guaifenesin (MUCINEX MAXIMUM STRENGTH) 1200 MG TB12  2. Cough  chlorpheniramine-HYDROcodone (TUSSIONEX PENNKINETIC ER) 10-8 MG/5ML LQCR  3. Acute sinusitis, unspecified  cefdinir (OMNICEF) 300 MG capsule   Supportive care.

## 2012-03-18 NOTE — Patient Instructions (Addendum)
Get LOTS of rest and drink at least 64 ounces of water daily.  Try to eliminate the decongestant-containing products.  Antihistamines are OK.

## 2012-08-06 ENCOUNTER — Ambulatory Visit (INDEPENDENT_AMBULATORY_CARE_PROVIDER_SITE_OTHER): Payer: 59 | Admitting: Emergency Medicine

## 2012-08-06 ENCOUNTER — Encounter: Payer: Self-pay | Admitting: Emergency Medicine

## 2012-08-06 VITALS — BP 128/82 | HR 92 | Temp 98.3°F | Resp 16 | Ht 67.0 in | Wt 220.6 lb

## 2012-08-06 DIAGNOSIS — I739 Peripheral vascular disease, unspecified: Secondary | ICD-10-CM

## 2012-08-06 NOTE — Progress Notes (Signed)
[facility] [address], [place] [address] [phone]  Date:  08/06/2012   Name:  Bianca Kent   DOB:  Mar 07, 1966   MRN:  [id number]  PCP:  No primary provider on file.    Chief Complaint: Foot Pain   History of Present Illness:  Bianca Kent is a 46 y.o. very pleasant female patient who presents with the following:  Patient has six months duration pain in her forefoot on left.  Worse in both feet with walking and with prolonged standing.  Has rest pain in both feet at times.  Pain in left foot is constant.  Has history or coronary artery spasm. No hypertension or diabetes but has dyslipidemia and smokes, resumed smoking recently but not at the 2 PPD level she was smoking previously.  There is no problem list on file for this patient.   No past medical history on file.  No past surgical history on file.  History  Substance Use Topics  . Smoking status: Former Smoker    Quit date: 08/05/2007  . Smokeless tobacco: Not on file  . Alcohol Use: Not on file    No family history on file.  Allergies  Allergen Reactions  . Iohexol      Code: RASH, Desc: VERY STRONG FAMILY HX OF ANGIOEDEMA WHEN RECEIVING IV CONTRAST; PT HAS BEEN PREMEDICATED FOR OTHER CONTRASTED STUDIES(IN CATH. LAB)  KR, Onset Date: [id number]   . Prednisone Itching  . Tetanus Toxoids Other (See Comments)    Ran a high fever for 48 hours  . Theophyllines Hives  . Versed (Midazolam) Other (See Comments)    Pt becomes violent    Medication list has been reviewed and updated.  Current Outpatient Prescriptions on File Prior to Visit  Medication Sig Dispense Refill  . albuterol (PROVENTIL HFA;VENTOLIN HFA) 108 (90 BASE) MCG/ACT inhaler Inhale 2 puffs into the lungs every 6 (six) hours as needed.      Marland Kitchen amLODipine (NORVASC) 5 MG tablet Take 5 mg by mouth daily.      Marland Kitchen diltiazem (CARDIZEM CD) 180 MG 24 hr capsule Take 180 mg by mouth daily.      Marland Kitchen ipratropium (ATROVENT) 0.03 %  nasal spray Place 2 sprays into the nose 2 (two) times daily.  30 mL  0  . isosorbide mononitrate (IMDUR) 60 MG 24 hr tablet Take 60 mg by mouth daily.      Marland Kitchen loratadine-pseudoephedrine (CLARITIN-D 24-HOUR) 10-240 MG per 24 hr tablet Take 1 tablet by mouth daily.      . Multiple Vitamin (MULTIVITAMIN) tablet Take 1 tablet by mouth daily.      . chlorpheniramine-HYDROcodone (TUSSIONEX PENNKINETIC ER) 10-8 MG/5ML LQCR Take 5 mLs by mouth every 12 (twelve) hours as needed (cough).  60 mL  0  . Guaifenesin (MUCINEX MAXIMUM STRENGTH) 1200 MG TB12 Take 1 tablet (1,200 mg total) by mouth every 12 (twelve) hours as needed.  14 tablet  1  . meloxicam (MOBIC) 15 MG tablet Take 1 tablet (15 mg total) by mouth daily.  30 tablet  0  . naproxen sodium (ANAPROX) 220 MG tablet Take 220 mg by mouth 2 (two) times daily with a meal.        Review of Systems:  As per HPI, otherwise negative.    Physical Examination: Filed Vitals:   08/06/12 0812  BP: 128/82  Pulse: 92  Temp: 98.3 F (36.8 C)  Resp: 16   Filed Vitals:  08/06/12 0812  Height: 5\' 7"  (1.702 m)  Weight: 220 lb 9.6 oz (100.064 kg)   Body mass index is 34.55 kg/(m^2). Ideal Body Weight: Weight in (lb) to have BMI = 25: 159.3   GEN: WDWN, NAD, Non-toxic, A & O x 3 HEENT: Atraumatic, Normocephalic. Neck supple. No masses, No LAD. Ears and Nose: No external deformity. CV: RRR, No M/G/R. No JVD. No thrill. No extra heart sounds. PULM: CTA B, no wheezes, crackles, rhonchi. No retractions. No resp. distress. No accessory muscle use. ABD: S, NT, ND, +BS. No rebound. No HSM. EXTR: No c/c/e.  Dorsalis pedis absent bilaterally.  Posterior tibial 1+ on right with absent on left.  Toes on both feet dusky with delayed capillary refill. NEURO Normal gait.  PSYCH: Normally interactive. Conversant. Not depressed or anxious appearing.  Calm demeanor.    Assessment and Plan: Claudication and rest pain Lower extremity non invasive vascular  studies. Follow up when complete  Carmelina Dane, MD  I have reviewed and agree with documentation. Robert P. Merla Riches, M.D.

## 2012-08-14 ENCOUNTER — Ambulatory Visit (HOSPITAL_COMMUNITY)
Admission: RE | Admit: 2012-08-14 | Discharge: 2012-08-14 | Disposition: A | Discharge status: Home / Self Care | Payer: 59 | Source: Ambulatory Visit | Attending: Emergency Medicine | Admitting: Emergency Medicine

## 2012-08-14 DIAGNOSIS — I739 Peripheral vascular disease, unspecified: Secondary | ICD-10-CM

## 2012-08-14 DIAGNOSIS — I70219 Atherosclerosis of native arteries of extremities with intermittent claudication, unspecified extremity: Secondary | ICD-10-CM | POA: Insufficient documentation

## 2012-08-14 DIAGNOSIS — F17200 Nicotine dependence, unspecified, uncomplicated: Secondary | ICD-10-CM | POA: Insufficient documentation

## 2012-08-14 DIAGNOSIS — I10 Essential (primary) hypertension: Secondary | ICD-10-CM | POA: Insufficient documentation

## 2012-08-14 DIAGNOSIS — I499 Cardiac arrhythmia, unspecified: Secondary | ICD-10-CM | POA: Insufficient documentation

## 2012-08-14 NOTE — Progress Notes (Signed)
VASCULAR LAB PRELIMINARY  ARTERIAL  ABI completed:    RIGHT    LEFT    PRESSURE WAVEFORM  PRESSURE WAVEFORM  BRACHIAL 148 Triphasic BRACHIAL 159 Triphasic     DP 176 Biphasic  AT 163 Biphasic     PT 154 Triphasic PT 183 Triphasic                  RIGHT LEFT  ABI 1.03 1.15   Mild technical difficulty due to cardiac arrythmia. ABIs and Doppler waveforms are within normal limits bilaterally.  Kyro Joswick, RVS 08/14/2012, 2:48 PM

## 2012-08-15 ENCOUNTER — Ambulatory Visit (INDEPENDENT_AMBULATORY_CARE_PROVIDER_SITE_OTHER): Payer: 59 | Admitting: Emergency Medicine

## 2012-08-15 VITALS — BP 120/82 | HR 93 | Temp 98.0°F | Resp 16 | Ht 67.0 in | Wt 217.8 lb

## 2012-08-15 DIAGNOSIS — Z23 Encounter for immunization: Secondary | ICD-10-CM

## 2012-08-15 DIAGNOSIS — M5430 Sciatica, unspecified side: Secondary | ICD-10-CM

## 2012-08-15 MED ORDER — HYDROCODONE-ACETAMINOPHEN 5-325 MG PO TABS: 1.0000 | ORAL_TABLET | ORAL | Status: AC | PRN

## 2012-08-15 MED ORDER — PNEUMOCOCCAL VAC POLYVALENT 25 MCG/0.5ML IJ INJ: 0.5000 mL | INJECTION | INTRAMUSCULAR | Status: AC

## 2012-08-15 NOTE — Progress Notes (Signed)
[facility] [address] [place] [address] [phone]  Date:  08/15/2012   Name:  Bianca Kent   DOB:  16-Mar-1966   MRN:  [id number]  PCP:  Lucilla Edin, MD    Chief Complaint: Foot Pain and Immunizations   History of Present Illness:  Bianca Kent is a 46 y.o. very pleasant female patient who presents with the following:  No improvement of pain in left lateral foot. Pain is worse when she stands and walks all day.  No history of injury or overuse.  Vascular studies negative except for persistent PVC's interfering with study technically.    There is no problem list on file for this patient.   Past Medical History  Diagnosis Date  . Allergy   . Arthritis   . Chronic kidney disease   . Asthma   . Coronary artery spasm     Past Surgical History  Procedure Date  . Cholecystectomy   . Knee surgery     right  . Tendon repair     finger  . Tonsillectomy   . Sinus surgery with instatrak     without instatrak    History  Substance Use Topics  . Smoking status: Current Every Day Smoker -- 0.2 packs/day    Types: Cigarettes    Last Attempt to Quit: 08/05/2007  . Smokeless tobacco: Not on file  . Alcohol Use: Not on file    Family History  Problem Relation Age of Onset  . Coronary artery disease Mother   . Diabetes Mother   . Hypertension Mother   . Coronary artery disease Father   . Hypertension Father   . Hypertension Sister   . Coronary artery disease Maternal Grandmother   . Stroke Maternal Grandmother   . Depression Maternal Grandmother   . Coronary artery disease Maternal Grandfather   . Stroke Maternal Grandfather   . Depression Maternal Grandfather   . Coronary artery disease Paternal Grandmother   . Cancer Paternal Grandmother   . Cancer Paternal Grandfather   . Hypertension Sister     Allergies  Allergen Reactions  . Iohexol      Code: RASH, Desc: VERY STRONG FAMILY HX OF ANGIOEDEMA WHEN RECEIVING IV CONTRAST;  PT HAS BEEN PREMEDICATED FOR OTHER CONTRASTED STUDIES(IN CATH. LAB)  KR, Onset Date: 29562130   . Prednisone Itching  . Tetanus Toxoids Other (See Comments)    Ran a high fever for 48 hours  . Theophyllines Hives  . Versed (Midazolam) Other (See Comments)    Pt becomes violent    Medication list has been reviewed and updated.  Current Outpatient Prescriptions on File Prior to Visit  Medication Sig Dispense Refill  . albuterol (PROVENTIL HFA;VENTOLIN HFA) 108 (90 BASE) MCG/ACT inhaler Inhale 2 puffs into the lungs every 6 (six) hours as needed.      Marland Kitchen amLODipine (NORVASC) 5 MG tablet Take 5 mg by mouth daily.      Marland Kitchen diltiazem (CARDIZEM CD) 180 MG 24 hr capsule Take 180 mg by mouth daily.      . isosorbide mononitrate (IMDUR) 60 MG 24 hr tablet Take 60 mg by mouth daily.      . naproxen sodium (ANAPROX) 220 MG tablet Take 220 mg by mouth 2 (two) times daily with a meal.      . chlorpheniramine-HYDROcodone (TUSSIONEX PENNKINETIC ER) 10-8 MG/5ML LQCR Take 5 mLs by mouth every 12 (twelve) hours as needed (cough).  60 mL  0  . Guaifenesin (MUCINEX MAXIMUM STRENGTH) 1200 MG TB12 Take 1 tablet (1,200 mg total) by mouth every 12 (twelve) hours as needed.  14 tablet  1  . ipratropium (ATROVENT) 0.03 % nasal spray Place 2 sprays into the nose 2 (two) times daily.  30 mL  0  . loratadine-pseudoephedrine (CLARITIN-D 24-HOUR) 10-240 MG per 24 hr tablet Take 1 tablet by mouth daily.      . meloxicam (MOBIC) 15 MG tablet Take 1 tablet (15 mg total) by mouth daily.  30 tablet  0  . Multiple Vitamin (MULTIVITAMIN) tablet Take 1 tablet by mouth daily.        Review of Systems:  As per HPI, otherwise negative.    Physical Examination: Filed Vitals:   08/15/12 0842  BP: 120/82  Pulse: 93  Temp: 98 F (36.7 C)  Resp: 16   Filed Vitals:   08/15/12 0842  Height: 5\' 7"  (1.702 m)  Weight: 217 lb 12.8 oz (98.793 kg)   Body mass index is 34.11 kg/(m^2). Ideal Body Weight: Weight in (lb) to have  BMI = 25: 159.3    GEN: WDWN, NAD, Non-toxic, Alert & Oriented x 3 HEENT: Atraumatic, Normocephalic.  Ears and Nose: No external deformity. EXTR: No clubbing/cyanosis/edema NEURO: antalgic gait. Mild extension weakness at left knee with decreased left knee and achilles DTR compared to right. PSYCH: Normally interactive. Conversant. Not depressed or anxious appearing.  Calm demeanor.    Assessment and Plan: Sciatic neuritis MRI Hydrocodone Requests pneumovax  Bianca Dane, MD  I have reviewed and agree with documentation. Bianca Kent Bianca Kent, M.D.

## 2012-08-20 ENCOUNTER — Ambulatory Visit
Admission: RE | Admit: 2012-08-20 | Discharge: 2012-08-20 | Disposition: A | Discharge status: Home / Self Care | Payer: 59 | Source: Ambulatory Visit | Attending: Emergency Medicine | Admitting: Emergency Medicine

## 2012-08-20 DIAGNOSIS — M5430 Sciatica, unspecified side: Secondary | ICD-10-CM

## 2012-08-29 ENCOUNTER — Telehealth: Payer: Self-pay | Admitting: *Deleted

## 2012-08-29 NOTE — Telephone Encounter (Signed)
Pt would like results of MRI.  She states she is having pain and wanted to know if she was going to be referred to somewhere or what.  Pt was a little upset because she has not heard anything from [place] yet. Please review.

## 2012-08-29 NOTE — Telephone Encounter (Signed)
Clinical TL: Dr. Dareen Piano has reviewed the MRI, which reveals Degenerative changes most prominent on the left at the L4-5 level, however, he has not yet provided his follow-up recommendations.  Dr. Dareen Piano: Please advise.

## 2012-08-30 ENCOUNTER — Other Ambulatory Visit: Payer: Self-pay | Admitting: Emergency Medicine

## 2012-08-30 DIAGNOSIS — M5430 Sciatica, unspecified side: Secondary | ICD-10-CM

## 2012-08-31 NOTE — Telephone Encounter (Signed)
lmom for pt to call back

## 2012-08-31 NOTE — Telephone Encounter (Signed)
Answered your request yesterday.  She has a referral pending to neurosurgery.  Please let her know.  Thanks

## 2012-09-01 NOTE — Telephone Encounter (Signed)
Advised pt of notes 

## 2012-09-05 ENCOUNTER — Other Ambulatory Visit (HOSPITAL_COMMUNITY): Payer: Self-pay | Admitting: Neurosurgery

## 2012-09-05 DIAGNOSIS — M79673 Pain in unspecified foot: Secondary | ICD-10-CM

## 2012-09-09 ENCOUNTER — Ambulatory Visit (HOSPITAL_COMMUNITY): Payer: 59

## 2012-09-10 ENCOUNTER — Ambulatory Visit (HOSPITAL_COMMUNITY)
Admission: RE | Admit: 2012-09-10 | Discharge: 2012-09-10 | Disposition: A | Discharge status: Home / Self Care | Payer: 59 | Source: Ambulatory Visit | Attending: Neurosurgery | Admitting: Neurosurgery

## 2012-09-10 DIAGNOSIS — M79673 Pain in unspecified foot: Secondary | ICD-10-CM

## 2012-09-10 DIAGNOSIS — M79609 Pain in unspecified limb: Secondary | ICD-10-CM | POA: Insufficient documentation

## 2012-10-23 ENCOUNTER — Ambulatory Visit (INDEPENDENT_AMBULATORY_CARE_PROVIDER_SITE_OTHER): Payer: 59 | Admitting: Emergency Medicine

## 2012-10-23 VITALS — BP 142/86 | HR 114 | Temp 98.6°F | Resp 16 | Ht 66.0 in | Wt 218.0 lb

## 2012-10-23 DIAGNOSIS — J209 Acute bronchitis, unspecified: Secondary | ICD-10-CM

## 2012-10-23 DIAGNOSIS — J018 Other acute sinusitis: Secondary | ICD-10-CM

## 2012-10-23 MED ORDER — HYDROCOD POLST-CHLORPHEN POLST 10-8 MG/5ML PO LQCR: 5.0000 mL | Freq: Two times a day (BID) | ORAL | Status: DC | PRN

## 2012-10-23 MED ORDER — AMOXICILLIN-POT CLAVULANATE 875-125 MG PO TABS: 1.0000 | ORAL_TABLET | Freq: Two times a day (BID) | ORAL | Status: DC

## 2012-10-23 NOTE — Progress Notes (Signed)
[facility] [address] [place] [address] [phone]  Date:  10/23/2012   Name:  Bianca Kent   DOB:  07-Apr-1966   MRN:  [id number]  PCP:  Lucilla Edin, MD    Chief Complaint: Cough, Nasal Congestion and Wheezing   History of Present Illness:  Bianca Kent is a 47 y.o. very pleasant female patient who presents with the following:  2 week history of purulent nasal and post nasal drainage.  Has developed a persistent non productive cough with paroxysms that stimulate vomiting.  Denies documented fever or chills, no nausea, or shortness of breath.  Taking OTC medication without improvement.  Noncompliant with MDI use, using only TID.  Still wheezing.  There is no problem list on file for this patient.   Past Medical History  Diagnosis Date  . Allergy   . Arthritis   . Chronic kidney disease   . Asthma   . Coronary artery spasm     Past Surgical History  Procedure Date  . Cholecystectomy   . Knee surgery     right  . Tendon repair     finger  . Tonsillectomy   . Sinus surgery with instatrak     without instatrak    History  Substance Use Topics  . Smoking status: Current Every Day Smoker -- 0.2 packs/day    Types: Cigarettes    Last Attempt to Quit: 08/05/2007  . Smokeless tobacco: Not on file  . Alcohol Use: Not on file    Family History  Problem Relation Age of Onset  . Coronary artery disease Mother   . Diabetes Mother   . Hypertension Mother   . Coronary artery disease Father   . Hypertension Father   . Hypertension Sister   . Coronary artery disease Maternal Grandmother   . Stroke Maternal Grandmother   . Depression Maternal Grandmother   . Coronary artery disease Maternal Grandfather   . Stroke Maternal Grandfather   . Depression Maternal Grandfather   . Coronary artery disease Paternal Grandmother   . Cancer Paternal Grandmother   . Cancer Paternal Grandfather   . Hypertension Sister     Allergies  Allergen  Reactions  . Iohexol      Code: RASH, Desc: VERY STRONG FAMILY HX OF ANGIOEDEMA WHEN RECEIVING IV CONTRAST; PT HAS BEEN PREMEDICATED FOR OTHER CONTRASTED STUDIES(IN CATH. LAB)  KR, Onset Date: [id number]   . Prednisone Itching  . Tetanus Toxoids Other (See Comments)    Ran a high fever for 48 hours  . Theophyllines Hives  . Versed (Midazolam) Other (See Comments)    Pt becomes violent    Medication list has been reviewed and updated.  Current Outpatient Prescriptions on File Prior to Visit  Medication Sig Dispense Refill  . albuterol (PROVENTIL HFA;VENTOLIN HFA) 108 (90 BASE) MCG/ACT inhaler Inhale 2 puffs into the lungs every 6 (six) hours as needed.      Marland Kitchen amLODipine (NORVASC) 5 MG tablet Take 5 mg by mouth daily.      Marland Kitchen diltiazem (CARDIZEM CD) 180 MG 24 hr capsule Take 180 mg by mouth daily.      . Guaifenesin (MUCINEX MAXIMUM STRENGTH) 1200 MG TB12 Take 1 tablet (1,200 mg total) by mouth every 12 (twelve) hours as needed.  14 tablet  1  . isosorbide mononitrate (IMDUR) 60 MG 24 hr tablet Take 60 mg by mouth daily.      Marland Kitchen loratadine-pseudoephedrine (CLARITIN-D 24-HOUR) 10-240 MG  per 24 hr tablet Take 1 tablet by mouth daily.      . Multiple Vitamin (MULTIVITAMIN) tablet Take 1 tablet by mouth daily.      . naproxen sodium (ANAPROX) 220 MG tablet Take 220 mg by mouth 2 (two) times daily with a meal.      . aspirin 81 MG tablet Take 81 mg by mouth daily.      . chlorpheniramine-HYDROcodone (TUSSIONEX PENNKINETIC ER) 10-8 MG/5ML LQCR Take 5 mLs by mouth every 12 (twelve) hours as needed (cough).  60 mL  0  . ipratropium (ATROVENT) 0.03 % nasal spray Place 2 sprays into the nose 2 (two) times daily.  30 mL  0  . meloxicam (MOBIC) 15 MG tablet Take 1 tablet (15 mg total) by mouth daily.  30 tablet  0    Review of Systems:  As per HPI, otherwise negative.    Physical Examination: Filed Vitals:   10/23/12 0806  BP: 142/86  Pulse: 114  Temp: 98.6 F (37 C)  Resp: 16   Filed  Vitals:   10/23/12 0806  Height: 5\' 6"  (1.676 m)  Weight: 218 lb (98.884 kg)   Body mass index is 35.19 kg/(m^2). Ideal Body Weight: Weight in (lb) to have BMI = 25: 154.6   GEN: WDWN, NAD, Non-toxic, A & O x 3 HEENT: Atraumatic, Normocephalic. Neck supple. No masses, No LAD. Ears and Nose: No external deformity. CV: RRR, No M/G/R. No JVD. No thrill. No extra heart sounds. PULM: CTA B, no wheezes, crackles, rhonchi. No retractions. No resp. distress. No accessory muscle use. ABD: S, NT, ND, +BS. No rebound. No HSM. EXTR: No c/c/e NEURO Normal gait.  PSYCH: Normally interactive. Conversant. Not depressed or anxious appearing.  Calm demeanor.    Assessment and Plan: Acute bronchitis Sinusitis augmentin tussionex Increase albuterol use Follow up as needed  Carmelina Dane, MD

## 2012-10-24 NOTE — Progress Notes (Signed)
Reviewed and agree.

## 2012-10-24 NOTE — Progress Notes (Deleted)
  Subjective:    Patient ID: Bianca Kent, female    DOB: 1965/11/19, 47 y.o.   MRN: [id number]  HPI    Review of Systems     Objective:   Physical Exam        Assessment & Plan:

## 2012-12-07 ENCOUNTER — Other Ambulatory Visit: Payer: Self-pay

## 2013-03-13 ENCOUNTER — Ambulatory Visit: Payer: 59

## 2013-03-13 ENCOUNTER — Ambulatory Visit (INDEPENDENT_AMBULATORY_CARE_PROVIDER_SITE_OTHER): Payer: 59 | Admitting: Family Medicine

## 2013-03-13 VITALS — BP 120/73 | HR 80 | Temp 98.4°F | Resp 18 | Wt 204.0 lb

## 2013-03-13 DIAGNOSIS — M25569 Pain in unspecified knee: Secondary | ICD-10-CM

## 2013-03-13 DIAGNOSIS — M25561 Pain in right knee: Secondary | ICD-10-CM

## 2013-03-13 DIAGNOSIS — M25461 Effusion, right knee: Secondary | ICD-10-CM

## 2013-03-13 DIAGNOSIS — M25469 Effusion, unspecified knee: Secondary | ICD-10-CM

## 2013-03-13 DIAGNOSIS — T148XXA Other injury of unspecified body region, initial encounter: Secondary | ICD-10-CM

## 2013-03-13 MED ORDER — HYDROCODONE-ACETAMINOPHEN 5-325 MG PO TABS: 1.0000 | ORAL_TABLET | Freq: Every evening | ORAL | Status: DC | PRN

## 2013-03-13 MED ORDER — NAPROXEN 500 MG PO TABS: 500.0000 mg | ORAL_TABLET | Freq: Two times a day (BID) | ORAL | Status: DC

## 2013-03-13 MED ORDER — METHOCARBAMOL 500 MG PO TABS: 500.0000 mg | ORAL_TABLET | Freq: Every evening | ORAL | Status: DC | PRN

## 2013-03-13 MED ORDER — TRAMADOL HCL 50 MG PO TABS: 50.0000 mg | ORAL_TABLET | Freq: Three times a day (TID) | ORAL | Status: DC | PRN

## 2013-03-13 NOTE — Progress Notes (Signed)
[facility]:  Office Visit  Chief Complaint:  Chief Complaint  Patient presents with  . Knee Pain    roght    HPI: Bianca Kent is a 47 y.o. female who complains of  3 day history of crepitus and also worsening pain in right knee. Described as aching pain, could not sleep last night. Problems pending. NKI. H/o arthritis. 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 Continued to get worse. + Minimal swelling. Took aleve without relief. Constant 3/10 dull aching pain, if she bends 10.10 and if straighten worse. She had arthroscopy by Dr. Shelle Iron 10 years ago and has cleaned up her meniscus.  No history of gout. No redness, no instability, no giving way, no pop/click.   Past Medical History  Diagnosis Date  . Allergy   . Arthritis   . Chronic kidney disease   . Asthma   . Coronary artery spasm    Past Surgical History  Procedure Laterality Date  . Cholecystectomy    . Knee surgery      right  . Tendon repair      finger  . Tonsillectomy    . Sinus surgery with instatrak      without instatrak   History   Social History  . Marital Status: Legally Separated    Spouse Name: N/A    Number of Children: N/A  . Years of Education: N/A   Social History Main Topics  . Smoking status: Current Every Day Smoker -- 0.25 packs/day    Types: Cigarettes    Last Attempt to Quit: 08/05/2007  . Smokeless tobacco: None  . Alcohol Use: Yes  . Drug Use: No  . Sexually Active: Yes    Birth Control/ Protection: Condom   Other Topics Concern  . None   Social History Narrative  . None   Family History  Problem Relation Age of Onset  . Coronary artery disease Mother   . Diabetes Mother   . Hypertension Mother   . Coronary artery disease Father   . Hypertension Father   . Hypertension Sister   . Coronary artery disease Maternal Grandmother   . Stroke Maternal Grandmother   . Depression Maternal Grandmother   . Coronary artery disease Maternal Grandfather   .  Stroke Maternal Grandfather   . Depression Maternal Grandfather   . Coronary artery disease Paternal Grandmother   . Cancer Paternal Grandmother   . Cancer Paternal Grandfather   . Hypertension Sister    Allergies  Allergen Reactions  . Iohexol      Code: RASH, Desc: VERY STRONG FAMILY HX OF ANGIOEDEMA WHEN RECEIVING IV CONTRAST; PT HAS BEEN PREMEDICATED FOR OTHER CONTRASTED STUDIES(IN CATH. LAB)  KR, Onset Date: 16109604   . Prednisone Itching  . Tetanus Toxoids Other (See Comments)    Ran a high fever for 48 hours  . Theophyllines Hives  . Versed (Midazolam) Other (See Comments)    Pt becomes violent   Prior to Admission medications   Medication Sig Start Date End Date Taking? Authorizing Provider  albuterol (PROVENTIL HFA;VENTOLIN HFA) 108 (90 BASE) MCG/ACT inhaler Inhale 2 puffs into the lungs every 6 (six) hours as needed.   Yes Historical Provider, MD  amLODipine (NORVASC) 5 MG tablet Take 5 mg by mouth daily.   Yes Historical Provider, MD  aspirin 81 MG tablet Take 81 mg by mouth daily.   Yes Historical Provider, MD  diltiazem (CARDIZEM CD) 180 MG 24 hr capsule  Take 180 mg by mouth daily.   Yes Historical Provider, MD  isosorbide mononitrate (IMDUR) 60 MG 24 hr tablet Take 60 mg by mouth daily.   Yes Historical Provider, MD  loratadine-pseudoephedrine (CLARITIN-D 24-HOUR) 10-240 MG per 24 hr tablet Take 1 tablet by mouth daily.   Yes Historical Provider, MD  Multiple Vitamin (MULTIVITAMIN) tablet Take 1 tablet by mouth daily.   Yes Historical Provider, MD     ROS: The patient denies fevers, chills, night sweats, unintentional weight loss, chest pain, palpitations, wheezing, dyspnea on exertion, nausea, vomiting, abdominal pain, dysuria, hematuria, melena, numbness, weakness, or tingling.   All other systems have been reviewed and were otherwise negative with the exception of those mentioned in the HPI and as above.    PHYSICAL EXAM: Filed Vitals:   03/13/13 0915  BP:  120/73  Pulse: 80  Temp: 98.4 F (36.9 C)  Resp: 18   Filed Vitals:   03/13/13 0915  Weight: 204 lb (92.534 kg)   Body mass index is 32.94 kg/(m^2).  General: Alert, no acute distress HEENT:  Normocephalic, atraumatic, oropharynx patent.  Cardiovascular:  Regular rate and rhythm, no rubs murmurs or gallops.  No Carotid bruits, radial pulse intact. No pedal edema.  Respiratory: Clear to auscultation bilaterally.  No wheezes, rales, or rhonchi.  No cyanosis, no use of accessory musculature GI: No organomegaly, abdomen is soft and non-tender, positive bowel sounds.  No masses. Skin: No rashes. Neurologic: Facial musculature symmetric. Psychiatric: Patient is appropriate throughout our interaction. Lymphatic: No cervical lymphadenopathy Musculoskeletal: Gait antalgic + swelling right knee, medial aspect No appreciable crepitus Decrease ROM in flexion, about 20 degrees of flexion due to pain + 5/5 strength + sensation Guarding so +/- Lachman + MCL tenderness and medail joint line tenderness    LABS: Results for orders placed during the hospital encounter of 03/08/11  POCT HEMOGLOBIN-HEMACUE      Result Value Range   Hemoglobin 14.5  12.0 - 15.0 g/dL     EKG/XRAY:   Primary read interpreted by Dr. Conley Rolls at [facility]. No fx/dislocation + posterior osteophyte + minimal DJD   ASSESSMENT/PLAN: Encounter Diagnoses  Name Primary?  . Right knee pain Yes  . Sprain and strain   . Knee swelling, right    Rx knee brace Rx Robaxen, Tramadol, Vicodin, Naproxen Advise patient about SEs and also I think it should be ok with her chronic meds. Advise to take Tramdol, Robaxen and Vicodin when she is not working/driving Gross sideeffects, risk and benefits, and alternatives of medications d/w patient. Patient is aware that all medications have potential sideeffects and we are unable to predict every sideeffect or drug-drug interaction that may occur. F/u prn    Sharin Altidor PHUONG,  DO 03/13/2013 10:58 AM

## 2013-08-28 ENCOUNTER — Other Ambulatory Visit: Payer: Self-pay

## 2013-10-31 ENCOUNTER — Ambulatory Visit: Payer: 59

## 2013-10-31 ENCOUNTER — Ambulatory Visit (INDEPENDENT_AMBULATORY_CARE_PROVIDER_SITE_OTHER): Payer: 59 | Admitting: Internal Medicine

## 2013-10-31 VITALS — BP 126/78 | HR 108 | Temp 100.0°F | Resp 17 | Ht 66.5 in | Wt 210.0 lb

## 2013-10-31 DIAGNOSIS — R0602 Shortness of breath: Secondary | ICD-10-CM

## 2013-10-31 DIAGNOSIS — R059 Cough, unspecified: Secondary | ICD-10-CM

## 2013-10-31 DIAGNOSIS — R509 Fever, unspecified: Secondary | ICD-10-CM

## 2013-10-31 DIAGNOSIS — R05 Cough: Secondary | ICD-10-CM

## 2013-10-31 LAB — POCT CBC
Granulocyte percent: 81 %G — AB (ref 37–80)
HCT, POC: 46 % (ref 37.7–47.9)
Hemoglobin: 14.5 g/dL (ref 12.2–16.2)
Lymph, poc: 0.9 (ref 0.6–3.4)
MCH, POC: 30.3 pg (ref 27–31.2)
MCHC: 31.5 g/dL — AB (ref 31.8–35.4)
MCV: 96 fL (ref 80–97)
MID (cbc): 0.5 (ref 0–0.9)
MPV: 11.4 fL (ref 0–99.8)
POC Granulocyte: 6 (ref 2–6.9)
POC LYMPH PERCENT: 12 %L (ref 10–50)
POC MID %: 7 %M (ref 0–12)
Platelet Count, POC: 202 10*3/uL (ref 142–424)
RBC: 4.79 M/uL (ref 4.04–5.48)
RDW, POC: 14.2 %
WBC: 7.4 10*3/uL (ref 4.6–10.2)

## 2013-10-31 LAB — POCT INFLUENZA A/B
Influenza A, POC: NEGATIVE
Influenza B, POC: NEGATIVE

## 2013-10-31 MED ORDER — ALBUTEROL SULFATE HFA 108 (90 BASE) MCG/ACT IN AERS: 2.0000 | INHALATION_SPRAY | RESPIRATORY_TRACT | Status: DC | PRN

## 2013-10-31 MED ORDER — ALBUTEROL SULFATE (2.5 MG/3ML) 0.083% IN NEBU
2.5000 mg | INHALATION_SOLUTION | Freq: Once | RESPIRATORY_TRACT | Status: DC
Start: 2013-10-31 — End: 2014-03-24

## 2013-10-31 MED ORDER — IPRATROPIUM BROMIDE 0.02 % IN SOLN: 0.5000 mg | Freq: Once | RESPIRATORY_TRACT | Status: DC

## 2013-10-31 MED ORDER — HYDROCOD POLST-CHLORPHEN POLST 10-8 MG/5ML PO LQCR: 5.0000 mL | Freq: Two times a day (BID) | ORAL | Status: DC | PRN

## 2013-10-31 MED ORDER — AZITHROMYCIN 250 MG PO TABS: ORAL_TABLET | ORAL | Status: DC

## 2013-10-31 NOTE — Progress Notes (Signed)
Subjective:    Patient ID: Bianca Kent, female    DOB: 1966/02/17, 48 y.o.   MRN: [id number]  HPI  48 year old female presents for evaluation of progressively worsening symptoms. Has had about 1 month history of nasal congestion with clear, thin rhinorrhea. Has hx of chronic sinusitis and states this has been typical for her.  Then, 1 week ago she developed a cough.  On 1/7 symptoms acutely worsened including fever, chills, productive cough, and SOB.  She hast taken OTC cold medicines which have not helped much.  Denies chest pain, dizziness, otalgia, nausea, or vomiting. Pt works at [employer] and has had multiple exposures to flu.   Patient is otherwise doing well with no other concerns today.     Review of Systems  Constitutional: Positive for fever and chills.  HENT: Positive for congestion, sinus pressure and sore throat.   Respiratory: Positive for cough, chest tightness and shortness of breath. Negative for wheezing.   Cardiovascular: Negative for chest pain.  Gastrointestinal: Negative for nausea, vomiting and abdominal pain.  Neurological: Negative for dizziness and headaches.       Objective:   Physical Exam  Constitutional: She is oriented to person, place, and time. She appears well-developed and well-nourished.  HENT:  Head: Normocephalic and atraumatic.  Right Ear: External ear normal.  Left Ear: External ear normal.  Mouth/Throat: Oropharynx is clear and moist.  Eyes: Conjunctivae are normal.  Neck: Normal range of motion. Neck supple.  Cardiovascular: Normal rate, regular rhythm and normal heart sounds.   Pulmonary/Chest: Effort normal. She has no decreased breath sounds. She has no wheezes. She has no rhonchi. She has rales in the right middle field and the right lower field.  Lymphadenopathy:    She has no cervical adenopathy.  Neurological: She is alert and oriented to person, place, and time.  Psychiatric: She has a normal mood and affect. Her behavior is  normal. Judgment and thought content normal.   Results for orders placed in visit on 10/31/13  POCT CBC      Result Value Range   WBC 7.4  4.6 - 10.2 K/uL   Lymph, poc 0.9  0.6 - 3.4   POC LYMPH PERCENT 12.0  10 - 50 %L   MID (cbc) 0.5  0 - 0.9   POC MID % 7.0  0 - 12 %M   POC Granulocyte 6.0  2 - 6.9   Granulocyte percent 81.0 (*) 37 - 80 %G   RBC 4.79  4.04 - 5.48 M/uL   Hemoglobin 14.5  12.2 - 16.2 g/dL   HCT, POC 46.0  37.7 - 47.9 %   MCV 96.0  80 - 97 fL   MCH, POC 30.3  27 - 31.2 pg   MCHC 31.5 (*) 31.8 - 35.4 g/dL   RDW, POC 14.2     Platelet Count, POC 202  142 - 424 K/uL   MPV 11.4  0 - 99.8 fL  POCT INFLUENZA A/B      Result Value Range   Influenza A, POC Negative     Influenza B, POC Negative      [facility] reading (PRIMARY) by  Dr. Laney Pastor as increased markings right middle lobe.  Patient reports improvement in symptoms s/p albuterol/atrovent nebulizer.     Assessment & Plan:  Cough - Plan: POCT CBC, DG Chest 2 View, azithromycin (ZITHROMAX) 250 MG tablet, chlorpheniramine-HYDROcodone (TUSSIONEX PENNKINETIC ER) 10-8 MG/5ML LQCR  Fever, unspecified - Plan: POCT Influenza  A/B  Shortness of breath - Plan: albuterol (PROVENTIL) (2.5 MG/3ML) 0.083% nebulizer solution 2.5 mg, ipratropium (ATROVENT) nebulizer solution 0.5 mg, albuterol (PROVENTIL HFA;VENTOLIN HFA) 108 (90 BASE) MCG/ACT inhaler Labs reassuring but will go ahead and cover with Zpack as directed Albuterol q4-6hours prn SOB, wheezing, or cough Tussionex q12hours prn cough Increase fluids and rest Out of work until fever free for 24 hours Follow up if symptoms worsen or fail to improve.    I have reviewed and agree with documentation. Robert P. Laney Pastor, M.D.

## 2014-02-20 DIAGNOSIS — C50919 Malignant neoplasm of unspecified site of unspecified female breast: Secondary | ICD-10-CM

## 2014-02-20 HISTORY — DX: Malignant neoplasm of unspecified site of unspecified female breast: C50.919

## 2014-02-23 ENCOUNTER — Ambulatory Visit (INDEPENDENT_AMBULATORY_CARE_PROVIDER_SITE_OTHER): Payer: 59 | Admitting: Emergency Medicine

## 2014-02-23 VITALS — BP 126/74 | HR 82 | Temp 98.1°F | Resp 16 | Ht 65.0 in | Wt 212.8 lb

## 2014-02-23 DIAGNOSIS — L0292 Furuncle, unspecified: Secondary | ICD-10-CM

## 2014-02-23 DIAGNOSIS — M79629 Pain in unspecified upper arm: Secondary | ICD-10-CM

## 2014-02-23 DIAGNOSIS — M79609 Pain in unspecified limb: Secondary | ICD-10-CM

## 2014-02-23 DIAGNOSIS — L0293 Carbuncle, unspecified: Secondary | ICD-10-CM | POA: Insufficient documentation

## 2014-02-23 NOTE — Progress Notes (Signed)
   Subjective:    Patient ID: Bianca Kent, female    DOB: 1965-12-17, 48 y.o.   MRN: [id number]  HPI 48 y.o. Female presents to clinic for under arm swelling that she began to notice on weds. Patient states that last mammogram was approx 7 years ago or more. States that she has had a boil under right arm for over the past month. Reports she usually uses antibacterial washes and creams to help clear them up. States that hasn't seemed to be helping recently. Reports having some tenderness to the right breast.    Review of Systems     Objective:   Physical Exam in the upper outer quadrant of the right breast is a tender ill-defined prominent area. There is also tenderness in the right axilla. There are multiple draining boils present in the axilla and on the undersurface of the right breast        Assessment & Plan:  Referral made for diagnostic mammogram involving the right breast but to be done for both breasts. She will be on doxycycline. Referral made to dermatology for evaluation.

## 2014-02-23 NOTE — Patient Instructions (Signed)
Please see Dr. Laney Pastor next week so he can recheck the area of tenderness in your right breast

## 2014-02-24 ENCOUNTER — Telehealth: Payer: Self-pay

## 2014-02-24 MED ORDER — DOXYCYCLINE HYCLATE 100 MG PO CAPS: 100.0000 mg | ORAL_CAPSULE | Freq: Two times a day (BID) | ORAL | Status: DC

## 2014-02-24 NOTE — Telephone Encounter (Signed)
I see in Dr Perfecto Kingdom notes that "pt will be on doxy" but don't see that he ordered any. Can someone please address? Pended basic doxy Rx to be reviewed and changed as needed.

## 2014-02-24 NOTE — Telephone Encounter (Signed)
PT STATES THE DR WAS TO CALL HER IN SOME DOXYCYCLINE AND IT HASN'T BEEN DONE YET AND SHE IS IN NEED. PLEASE CALL [phone]    [facility] [address]

## 2014-02-24 NOTE — Telephone Encounter (Signed)
Meds ordered this encounter  Medications  . doxycycline (VIBRAMYCIN) 100 MG capsule    Sig: Take 1 capsule (100 mg total) by mouth 2 (two) times daily.    Dispense:  20 capsule    Refill:  0

## 2014-02-25 LAB — WOUND CULTURE
GRAM STAIN: NONE SEEN
Gram Stain: NONE SEEN
Gram Stain: NONE SEEN

## 2014-03-05 ENCOUNTER — Ambulatory Visit (INDEPENDENT_AMBULATORY_CARE_PROVIDER_SITE_OTHER): Payer: 59 | Admitting: Family Medicine

## 2014-03-05 VITALS — BP 110/80 | HR 92 | Temp 98.3°F | Resp 18 | Ht 65.0 in | Wt 214.4 lb

## 2014-03-05 DIAGNOSIS — N63 Unspecified lump in unspecified breast: Secondary | ICD-10-CM

## 2014-03-05 DIAGNOSIS — M7989 Other specified soft tissue disorders: Secondary | ICD-10-CM

## 2014-03-05 NOTE — Progress Notes (Signed)
   Subjective:    Patient ID: Bianca Kent, female    DOB: 1966/03/21, 48 y.o.   MRN: [id number]  HPI Patient was seen about 10 days ago with several draining boils under her right breast and right breast upper outer quadrant pain and swelling radiating into the axilla. The drainage was cultured and the patient was started on doxycycline. She was referred to dermatology who saw her last week. She was continued on doxy, expected to complete a 3 month course of treatment. The boils have stopped draining and have decreased in size.  She presents this morning with increased swelling and pain in right breast/axillary region. An order for a mammogram was placed at patient's last visit, but the patient was never contacted regarding the test. She reports the area is very painful, making it difficult for her sleep. She takes Aleve 2 tablets BID for arthritis pain.   Review of Systems No fever    Objective:   Physical Exam  Vitals reviewed. Constitutional: She is oriented to person, place, and time. She appears well-developed and well-nourished.  HENT:  Head: Normocephalic and atraumatic.  Neck: Normal range of motion. Neck supple.  Pulmonary/Chest: Effort normal. She exhibits mass, tenderness and edema. Right breast exhibits mass and tenderness. Right breast exhibits no inverted nipple, no nipple discharge and no skin change. Left breast exhibits no inverted nipple, no mass, no nipple discharge, no skin change and no tenderness. Breasts are asymmetrical.    Right breast, upper outer quadrant with 3-4 cm area fullness, firmness, very tender, non erythematous.This extends to right axillary area which is moderately swollen and tender. No focal lesion palpated.   Neurological: She is alert and oriented to person, place, and time.  Skin: Skin is warm and dry.  Psychiatric: She has a normal mood and affect. Her behavior is normal. Judgment and thought content normal.      Assessment & Plan:  1.  Swelling of breast - US BREAST LTD UNI RIGHT INC AXILLA; Future  2. Right axillary swelling - US BREAST LTD UNI RIGHT INC AXILLA; Future - Patient Instructions  [facility] [address] arrive at 10:15 for 10:30 appt today. Ph# [phone]     Elby Beck, FNP-BC  [facility]  03/05/2014 8:56 AM

## 2014-03-05 NOTE — Progress Notes (Signed)
I have discussed this case with Ms. Gessner, NP and agree.  

## 2014-03-05 NOTE — Patient Instructions (Signed)
[facility] [address] [place]. [address] arrive at 10:15 for 10:30 appt today. Ph# [phone]

## 2014-03-10 ENCOUNTER — Other Ambulatory Visit: Payer: Self-pay | Admitting: Radiology

## 2014-03-11 ENCOUNTER — Other Ambulatory Visit: Payer: Self-pay | Admitting: Radiology

## 2014-03-11 ENCOUNTER — Other Ambulatory Visit (HOSPITAL_COMMUNITY): Payer: Self-pay | Admitting: Radiology

## 2014-03-11 ENCOUNTER — Telehealth: Payer: Self-pay | Admitting: Family Medicine

## 2014-03-11 DIAGNOSIS — N644 Mastodynia: Secondary | ICD-10-CM

## 2014-03-11 DIAGNOSIS — C50919 Malignant neoplasm of unspecified site of unspecified female breast: Secondary | ICD-10-CM

## 2014-03-11 MED ORDER — HYDROCODONE-ACETAMINOPHEN 5-325 MG PO TABS: ORAL_TABLET | ORAL | Status: DC

## 2014-03-11 NOTE — Telephone Encounter (Signed)
Received a call from Dr. Brantley Stage who was just doing a breast bx on her today for what is likely metastatic cancer.  She is having pain and he asked me to prescribe something for her to use for pain. Called to discuss with her and she is ok coming to pick up an rx for vicodin.  Will write this for her and leave up front.  She states no risk of pregnancy, she takes otherwise just albuterol.

## 2014-03-12 ENCOUNTER — Encounter (INDEPENDENT_AMBULATORY_CARE_PROVIDER_SITE_OTHER): Payer: 59 | Admitting: General Surgery

## 2014-03-13 ENCOUNTER — Telehealth (INDEPENDENT_AMBULATORY_CARE_PROVIDER_SITE_OTHER): Payer: Self-pay

## 2014-03-13 DIAGNOSIS — C50919 Malignant neoplasm of unspecified site of unspecified female breast: Secondary | ICD-10-CM

## 2014-03-13 NOTE — Telephone Encounter (Signed)
Called pt to notify her that I did place a referral to the cancer ctr for an appt ASAP. I wanted her to know in case if she received a call from them this pm. I advised pt that we would discuss this on her appt with Dr Donne Hazel on Tuesday. The pt understands.

## 2014-03-17 ENCOUNTER — Encounter (INDEPENDENT_AMBULATORY_CARE_PROVIDER_SITE_OTHER): Payer: Self-pay | Admitting: General Surgery

## 2014-03-17 ENCOUNTER — Telehealth (INDEPENDENT_AMBULATORY_CARE_PROVIDER_SITE_OTHER): Payer: Self-pay | Admitting: *Deleted

## 2014-03-17 ENCOUNTER — Encounter (INDEPENDENT_AMBULATORY_CARE_PROVIDER_SITE_OTHER): Payer: Self-pay

## 2014-03-17 ENCOUNTER — Other Ambulatory Visit (INDEPENDENT_AMBULATORY_CARE_PROVIDER_SITE_OTHER): Payer: Self-pay | Admitting: *Deleted

## 2014-03-17 ENCOUNTER — Telehealth (INDEPENDENT_AMBULATORY_CARE_PROVIDER_SITE_OTHER): Payer: Self-pay

## 2014-03-17 ENCOUNTER — Ambulatory Visit (INDEPENDENT_AMBULATORY_CARE_PROVIDER_SITE_OTHER): Payer: Commercial Managed Care - PPO | Admitting: General Surgery

## 2014-03-17 VITALS — BP 160/100 | HR 86 | Temp 98.5°F | Resp 15 | Ht 67.0 in | Wt 214.0 lb

## 2014-03-17 DIAGNOSIS — C50419 Malignant neoplasm of upper-outer quadrant of unspecified female breast: Secondary | ICD-10-CM

## 2014-03-17 NOTE — Telephone Encounter (Signed)
LMOM for pt to call me. I want to give her some appt information.

## 2014-03-17 NOTE — Addendum Note (Signed)
Addended by: Illene Regulus on: 03/17/2014 02:24 PM   Modules accepted: Orders

## 2014-03-17 NOTE — Progress Notes (Signed)
Patient ID: Bianca Kent, female   DOB: Dec 26, 1965, 48 y.o.   MRN: [id number]  Chief Complaint  Patient presents with  . Other    HPI Bianca Kent is a 48 y.o. female.  Referred by Dr Christene Slates HPI 81 yof who presents with 2 month history of painful right axillary mass that has been enlarging.  MM showed irregular high density axillary node in the right breast.  Breast density is C.  US showed a 2.5 cm right axillary node with cortical thickening and numerous other nodes.  Breast US shows a 1.2 cm mass in the right breast at 12 o clock.  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  Node biopsy shows er pos at 63%, pr pos at 15%, Ki 81% and her 2 pending.  She comes in today complaining of right axillary pain.  Past Medical History  Diagnosis Date  . Allergy   . Arthritis   . Chronic kidney disease   . Asthma   . Coronary artery spasm   . Hypertension     Past Surgical History  Procedure Laterality Date  . Cholecystectomy    . Knee surgery      right  . Tendon repair      finger  . Tonsillectomy    . Sinus surgery with instatrak      without instatrak  . Tubal ligation      Family History  Problem Relation Age of Onset  . Coronary artery disease Mother   . Diabetes Mother   . Hypertension Mother   . Coronary artery disease Father   . Hypertension Father   . Hypertension Sister   . Coronary artery disease Maternal Grandmother   . Stroke Maternal Grandmother   . Depression Maternal Grandmother   . Cancer Maternal Grandmother     colon  . Coronary artery disease Maternal Grandfather   . Stroke Maternal Grandfather   . Depression Maternal Grandfather   . Coronary artery disease Paternal Grandmother   . Cancer Paternal Grandmother   . Cancer Paternal Grandfather     lung  . Hypertension Sister   . Cancer Paternal Aunt     breast    Social History History  Substance Use Topics  . Smoking status: Former Smoker -- 0.25 packs/day   Types: Cigarettes    Quit date: 02/19/2014  . Smokeless tobacco: Not on file  . Alcohol Use: Yes    Allergies  Allergen Reactions  . Iohexol      Code: RASH, Desc: VERY STRONG FAMILY HX OF ANGIOEDEMA WHEN RECEIVING IV CONTRAST; PT HAS BEEN PREMEDICATED FOR OTHER CONTRASTED STUDIES(IN CATH. LAB)  KR, Onset Date: [id number]   . Prednisone Itching  . Tetanus Toxoids Other (See Comments)    Ran a high fever for 48 hours  . Theophyllines Hives  . Versed [Midazolam] Other (See Comments)    Pt becomes violent    Current Outpatient Prescriptions  Medication Sig Dispense Refill  . albuterol (PROVENTIL HFA;VENTOLIN HFA) 108 (90 BASE) MCG/ACT inhaler Inhale 2 puffs into the lungs every 4 (four) hours as needed for wheezing or shortness of breath (cough, shortness of breath or wheezing.).  1 Inhaler  1  . aspirin 81 MG tablet Take 81 mg by mouth daily.      Marland Kitchen doxycycline (VIBRAMYCIN) 100 MG capsule Take 1 capsule (100 mg total) by mouth 2 (two) times daily.  20 capsule  0  . HYDROcodone-acetaminophen (NORCO/VICODIN) 5-325 MG per  tablet Take 1 every 6 to 8 hours as needed for pain.  Do not drive on this medication.  30 tablet  0  . loratadine-pseudoephedrine (CLARITIN-D 24-HOUR) 10-240 MG per 24 hr tablet Take 1 tablet by mouth daily.      . Multiple Vitamin (MULTIVITAMIN) tablet Take 1 tablet by mouth daily.      . naproxen sodium (ANAPROX) 220 MG tablet Take 220 mg by mouth 2 (two) times daily with a meal.       Current Facility-Administered Medications  Medication Dose Route Frequency Provider Last Rate Last Dose  . albuterol (PROVENTIL) (2.5 MG/3ML) 0.083% nebulizer solution 2.5 mg  2.5 mg Nebulization Once Collene Leyden, PA-C        Review of Systems Review of Systems  Constitutional: Negative for fever, chills and unexpected weight change.  HENT: Positive for congestion. Negative for hearing loss, sore throat, trouble swallowing and voice change.   Eyes: Negative for visual  disturbance.  Respiratory: Negative for cough and wheezing.   Cardiovascular: Negative for chest pain, palpitations and leg swelling.  Gastrointestinal: Negative for nausea, vomiting, abdominal pain, diarrhea, constipation, blood in stool, abdominal distention and anal bleeding.  Genitourinary: Negative for hematuria, vaginal bleeding and difficulty urinating.  Musculoskeletal: Negative for arthralgias.  Skin: Negative for rash and wound.  Neurological: Negative for seizures, syncope and headaches.  Hematological: Negative for adenopathy. Does not bruise/bleed easily.  Psychiatric/Behavioral: Negative for confusion.    Blood pressure 160/100, pulse 86, temperature 98.5 F (36.9 C), temperature source Oral, resp. rate 15, height 5\' 7"  (1.702 m), weight 214 lb (97.07 kg).  Physical Exam Physical Exam  Vitals reviewed. Constitutional: She appears well-developed and well-nourished.  Lymphadenopathy:    She has no cervical adenopathy.    She has axillary adenopathy.       Right axillary: Pectoral and lateral (multiple enlarged fixed axillary nodes) adenopathy present.       Right: No supraclavicular adenopathy present.       Left: No supraclavicular adenopathy present.    Data Reviewed Mm, [place], path reviewed  Assessment    Likely clinical stage III breast cancer     Plan   PET scan due to stage III disease on exam, MRI later this week, Oncology appt, cards evaluation, port placement for primary systemic chemotherapy  We discussed the staging and pathophysiology of breast cancer. We discussed all of the different options for treatment for breast cancer including surgery, chemotherapy, radiation therapy, possiblyHerceptin, and antiestrogen therapy.  I think due to stage III disease clinically now and my concern for possibly even having stage IV disease would be best to treat systemically. I think would be difficult to do alnd now and her primary therapy should be systemic at this  point. We discussed role of mr in deciding surgery later in chemo course, pet to get baseline and rule out stage IV disease, oncology eval and cardiology follow up given history of coronary spasm, htn and her bp is elevated today.  She is former patient of Dr Dorris Fetch.   We discussed port placement with risks /benefits also.          Rolm Bookbinder 03/17/2014, 8:48 AM

## 2014-03-17 NOTE — Telephone Encounter (Signed)
Called trying to get an appt ASAP to get cardiac clearance with Dr C at [facility] on [address]. The pt used to see Dr Aldona Bar for chest tightness and pressure. The pt is needing an appt ASAP now requesting Dr C b/c she has just been diagnosed with new breast cancer and will be needing a PAC placed ASAP. I explained that the pt will have to have clearance in order for [place] to get her scheduled ASAP due to her c/o of the chest tightness again.  Bianca Kent is working on getting an appt ASAP and will call me back.

## 2014-03-17 NOTE — Telephone Encounter (Signed)
LM for pt to return my call and just ask for me.  I have several appointments I need to discuss with her.  Thanks!  Anderson Malta

## 2014-03-17 NOTE — Telephone Encounter (Signed)
Bianca Kent returned my call. Appt made with Dr Sallyanne Kuster for 02/17/14 4:30/5:00 to get cardiac clearance.

## 2014-03-18 ENCOUNTER — Encounter (INDEPENDENT_AMBULATORY_CARE_PROVIDER_SITE_OTHER): Payer: Self-pay

## 2014-03-18 ENCOUNTER — Telehealth (INDEPENDENT_AMBULATORY_CARE_PROVIDER_SITE_OTHER): Payer: Self-pay

## 2014-03-18 DIAGNOSIS — C50911 Malignant neoplasm of unspecified site of right female breast: Secondary | ICD-10-CM

## 2014-03-18 NOTE — Telephone Encounter (Signed)
Called pt back to let her know that I could not change the breast MRI appt time b/c they are booked out till next week so I called Dr Croitoru's office to see about r/s the appt. I made her an appt for 5/28 9:30/10:00 with Dr Quay Burow b/c Dr Sallyanne Kuster did not have any new appt's. I explained to the pt that I know she requested Dr Sallyanne Kuster but right now we need clearance ASAP so I took the first available. The pt understands.

## 2014-03-18 NOTE — Telephone Encounter (Signed)
Called pt to notify her that we have her an appt with Dr Burney Gauze now instead of Dr Jana Hakim b/c we could get an earlier appt. I have her scheduled for 03/24/14 arrive at 11:45 with Dr [name] in [facility]. The pt understands.

## 2014-03-18 NOTE — Telephone Encounter (Signed)
Called pt to give her the appt with Dr Sallyanne Kuster but I realized this appt conflicted with pt's breast mri for the same date/time. I advised pt that I was going to have to make some more phone calls to see what I could I do about fixing the appt's I would call her back. The pt understands.

## 2014-03-19 ENCOUNTER — Ambulatory Visit (INDEPENDENT_AMBULATORY_CARE_PROVIDER_SITE_OTHER): Payer: 59 | Admitting: Cardiovascular Disease

## 2014-03-19 ENCOUNTER — Encounter: Payer: Self-pay | Admitting: *Deleted

## 2014-03-19 ENCOUNTER — Ambulatory Visit (HOSPITAL_COMMUNITY)
Admission: RE | Admit: 2014-03-19 | Discharge: 2014-03-19 | Disposition: A | Discharge status: Home / Self Care | Payer: 59 | Source: Ambulatory Visit | Attending: Radiology | Admitting: Radiology

## 2014-03-19 ENCOUNTER — Ambulatory Visit: Payer: 59 | Admitting: Cardiovascular Disease

## 2014-03-19 ENCOUNTER — Encounter: Payer: Self-pay | Admitting: Cardiovascular Disease

## 2014-03-19 VITALS — BP 138/82 | HR 74 | Ht 67.0 in | Wt 214.3 lb

## 2014-03-19 DIAGNOSIS — Z79899 Other long term (current) drug therapy: Secondary | ICD-10-CM

## 2014-03-19 DIAGNOSIS — R079 Chest pain, unspecified: Secondary | ICD-10-CM | POA: Insufficient documentation

## 2014-03-19 DIAGNOSIS — C50919 Malignant neoplasm of unspecified site of unspecified female breast: Secondary | ICD-10-CM | POA: Insufficient documentation

## 2014-03-19 DIAGNOSIS — E782 Mixed hyperlipidemia: Secondary | ICD-10-CM

## 2014-03-19 LAB — LIPID PANEL
Cholesterol: 188 mg/dL (ref 0–200)
HDL: 38 mg/dL — AB (ref >39)
LDL CALC: 111 mg/dL — AB (ref 0–99)
Total CHOL/HDL Ratio: 4.9 Ratio
Triglycerides: 194 mg/dL — ABNORMAL HIGH (ref <150)
VLDL: 39 mg/dL (ref 0–40)

## 2014-03-19 LAB — HEPATIC FUNCTION PANEL
ALBUMIN: 4.5 g/dL (ref 3.5–5.2)
ALT: 67 U/L — AB (ref 0–35)
AST: 27 U/L (ref 0–37)
Alkaline Phosphatase: 113 U/L (ref 39–117)
Bilirubin, Direct: 0.1 mg/dL (ref 0.0–0.3)
Indirect Bilirubin: 0.3 mg/dL (ref 0.2–1.2)
TOTAL PROTEIN: 7.4 g/dL (ref 6.0–8.3)
Total Bilirubin: 0.4 mg/dL (ref 0.2–1.2)

## 2014-03-19 MED ORDER — GADOBENATE DIMEGLUMINE 529 MG/ML IV SOLN
20.0000 mL | Freq: Once | INTRAVENOUS | Status: AC | PRN
  Administered 2014-03-19: 20 mL via INTRAVENOUS

## 2014-03-19 NOTE — Patient Instructions (Signed)
Your physician recommends that you return for lab work in: fasting.  Your physician recommends that you schedule a follow-up appointment in: as needed.  Dr. Gwenlyn Found has given you surgical  clearance to get porta cath. A note will be sent to Dr. Donne Hazel.

## 2014-03-19 NOTE — Telephone Encounter (Signed)
Pt called to let me know she has been cleared by cardiology for cardiac clearance. I advised pt that I would turn her surgical orders into scheduling today for the [facility].

## 2014-03-19 NOTE — Assessment & Plan Note (Signed)
Pt with one episode of chest pain that is unlikely to be due to CAD. She has a cardiac catheterization in 2010 that showed no CAD, so it is unlikely that she developed CAD in that time. Furthermore, her EKG does not show any history of infarct. We will assess her lipid profile today, but otherwise, she does not need any further cardiac evaluation. The patient is cleared for insertion of a port-a-cath. Follow up as needed.

## 2014-03-19 NOTE — Progress Notes (Signed)
Patient ID: Bianca Kent, female   DOB: [birth year], 48 y.o.   MRN: [id number]     03/19/2014 Bianca Kent   07/01/1966  [id number]  Primary Physicia DAUB, Lina Sayre, MD Primary Cardiologist: Adora Fridge., MD (former patient of Dr. 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 She reports numerous clean catheterizations, with the most recent in 2010 performed by Dr. Corky Downs. For the angina, the patient was previously on a regimen of imdur, diltiazem, and amlodipine. This improved the pain, and the patient stopped taking the medications. She has not been seen by a cardiologist since 2012. 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 She is being followed by Dr. Rolm Bookbinder, general surgeon, who is planning for placement of a port-a-cath. He would like her to have cardiac clearance prior to this procedure. She reports an episode of chest and jaw pain radiating down the left arm. This was approximately 1 month ago. It lasted 24 hours and was relieved by taking sublingual nitroglycerin x 1. It was worsened by activity. It was not associated with shortness of breath, nausea, or vomiting. She has had no other recurrences of chest pain. She is not limited by chest pain or shortness of breath when at work at while doing house work. She is no longer smoking and has no history of diabetes. It has been several years since her lipid profile was checked. She does have a family history of CAD, as her father underwent CABG in his late 26's.     Current Outpatient Prescriptions  Medication Sig Dispense Refill  . albuterol (PROVENTIL HFA;VENTOLIN HFA) 108 (90 BASE) MCG/ACT inhaler Inhale 2 puffs into the lungs every 4 (four) hours as needed for wheezing or shortness of breath (cough, shortness of breath or wheezing.).  1 Inhaler  1    . aspirin 81 MG tablet Take 81 mg by mouth daily.      Marland Kitchen doxycycline (VIBRAMYCIN) 100 MG capsule Take 1 capsule (100 mg total) by mouth 2 (two) times daily.  20 capsule  0  . HYDROcodone-acetaminophen (NORCO/VICODIN) 5-325 MG per tablet Take 1 every 6 to 8 hours as needed for pain.  Do not drive on this medication.  30 tablet  0  . loratadine-pseudoephedrine (CLARITIN-D 24-HOUR) 10-240 MG per 24 hr tablet Take 1 tablet by mouth daily.      . Multiple Vitamin (MULTIVITAMIN) tablet Take 1 tablet by mouth daily.      . naproxen sodium (ANAPROX) 220 MG tablet Take 220 mg by mouth 2 (two) times daily with a meal.       Current Facility-Administered Medications  Medication Dose Route Frequency Provider Last Rate Last Dose  . albuterol (PROVENTIL) (2.5 MG/3ML) 0.083% nebulizer solution 2.5 mg  2.5 mg Nebulization Once Collene Leyden, PA-C        Allergies  Allergen Reactions  . Iohexol      Code: RASH, Desc: VERY STRONG FAMILY HX OF ANGIOEDEMA WHEN RECEIVING IV CONTRAST; PT HAS BEEN PREMEDICATED FOR OTHER CONTRASTED STUDIES(IN CATH. LAB)  KR, Onset Date: [id number]   . Prednisone Itching  . Tetanus Toxoids Other (See Comments)    Ran a high fever for 48 hours  . Theophyllines Hives  . Versed [Midazolam] Other (See Comments)    Pt becomes violent  History   Social History  . Marital Status: Legally Separated    Spouse Name: N/A    Number of Children: N/A  . Years of Education: N/A   Occupational History  . Not on file.   Social History Main Topics  . Smoking status: Former Smoker -- 0.25 packs/day    Types: Cigarettes    Quit date: 02/19/2014  . Smokeless tobacco: Not on file  . Alcohol Use: Yes  . Drug Use: No  . Sexual Activity: Yes    Birth Control/ Protection: Condom   Other Topics Concern  . Not on file   Social History Narrative  . No narrative on file     Review of Systems: General: negative for chills, fever, night sweats or weight changes.  Cardiovascular:  positive for chest pain x 1, dyspnea on exertion, edema, orthopnea, palpitations, paroxysmal nocturnal dyspnea or shortness of breath Dermatological: negative for rash Respiratory: negative for cough or wheezing Urologic: negative for hematuria Abdominal: negative for nausea, vomiting, diarrhea, bright red blood per rectum, melena, or hematemesis Neurologic: negative for visual changes, syncope, or dizziness All other systems reviewed and are otherwise negative except as noted above.    Blood pressure 138/82, pulse 74, height 5\' 7"  (1.702 m), weight 214 lb 4.8 oz (97.206 kg).  General appearance: alert, cooperative, appears stated age, moderately obese and very pleasant Neck: no adenopathy, no carotid bruit, no JVD, supple, symmetrical, trachea midline and thyroid not enlarged, symmetric, no tenderness/mass/nodules Lungs: clear to auscultation bilaterally Heart: regular rate and rhythm, S1, S2 normal, no murmur, click, rub or gallop Extremities: extremities normal, atraumatic, no cyanosis or edema Pulses: 2+ and symmetric Right axillae: tender palpable lymphadenopathy  EKG NSR, rate 74, no evidence of ischemia or infarct  ASSESSMENT AND PLAN:   Chest pain Pt with one episode of chest pain that is unlikely to be due to CAD. She has a cardiac catheterization in 2010 that showed no CAD, so it is unlikely that she developed CAD in that time. Furthermore, her EKG does not show any history of infarct. We will assess her lipid profile today, but otherwise, she does not need any further cardiac evaluation. The patient is cleared for insertion of a port-a-cath. Follow up as needed.    PLAN  Patient cleared for surgery. A letter will be sent to Dr. Donne Hazel. Check lipid profile. Follow up as needed.   [name], MD, [facility] 03/19/2014 11:34 AM   Agree with assessment and plan by Dr. Maricela Bo. Patient with history of normal coronary arteries in the past and chest pain related to  coronary artery spasm. She is scheduled to undergo low-risk procedure involving insertion of a Port-A-Cath. She is cleared to have this done at low cardiovascular risk without any further preoperative diagnostic studies.  Lorretta Harp, M.D., [place], [facility], [place] [address]. [place], [place]  [address]  [phone] 03/19/2014 5:43 PM

## 2014-03-23 ENCOUNTER — Ambulatory Visit (HOSPITAL_COMMUNITY)
Admission: RE | Admit: 2014-03-23 | Discharge: 2014-03-23 | Disposition: A | Discharge status: Home / Self Care | Payer: 59 | Source: Ambulatory Visit | Attending: General Surgery | Admitting: General Surgery

## 2014-03-23 ENCOUNTER — Telehealth: Payer: Self-pay | Admitting: Hematology & Oncology

## 2014-03-23 DIAGNOSIS — C50919 Malignant neoplasm of unspecified site of unspecified female breast: Secondary | ICD-10-CM | POA: Insufficient documentation

## 2014-03-23 DIAGNOSIS — C50419 Malignant neoplasm of upper-outer quadrant of unspecified female breast: Secondary | ICD-10-CM

## 2014-03-23 DIAGNOSIS — K7689 Other specified diseases of liver: Secondary | ICD-10-CM | POA: Insufficient documentation

## 2014-03-23 DIAGNOSIS — R599 Enlarged lymph nodes, unspecified: Secondary | ICD-10-CM | POA: Insufficient documentation

## 2014-03-23 DIAGNOSIS — J9819 Other pulmonary collapse: Secondary | ICD-10-CM | POA: Insufficient documentation

## 2014-03-23 LAB — GLUCOSE, CAPILLARY: GLUCOSE-CAPILLARY: 90 mg/dL (ref 70–99)

## 2014-03-23 MED ORDER — FLUDEOXYGLUCOSE F - 18 (FDG) INJECTION
12.1000 | Freq: Once | INTRAVENOUS | Status: AC | PRN
Start: 2014-03-23 — End: 2014-03-23
  Administered 2014-03-23: 12.1 via INTRAVENOUS

## 2014-03-23 NOTE — Telephone Encounter (Signed)
Left vm w NEW PATIENT today to remind them of their appointment with Dr. Ennever. Also, advised them to bring all medication bottles and insurance card information. ° °

## 2014-03-24 ENCOUNTER — Encounter: Payer: Self-pay | Admitting: Hematology & Oncology

## 2014-03-24 ENCOUNTER — Ambulatory Visit (HOSPITAL_BASED_OUTPATIENT_CLINIC_OR_DEPARTMENT_OTHER): Payer: 59 | Admitting: Hematology & Oncology

## 2014-03-24 ENCOUNTER — Encounter (HOSPITAL_COMMUNITY): Payer: Self-pay

## 2014-03-24 ENCOUNTER — Other Ambulatory Visit (HOSPITAL_BASED_OUTPATIENT_CLINIC_OR_DEPARTMENT_OTHER): Payer: 59 | Admitting: Lab

## 2014-03-24 ENCOUNTER — Ambulatory Visit: Payer: 59

## 2014-03-24 ENCOUNTER — Telehealth (INDEPENDENT_AMBULATORY_CARE_PROVIDER_SITE_OTHER): Payer: Self-pay

## 2014-03-24 ENCOUNTER — Other Ambulatory Visit (INDEPENDENT_AMBULATORY_CARE_PROVIDER_SITE_OTHER): Payer: Self-pay | Admitting: General Surgery

## 2014-03-24 VITALS — BP 145/75 | HR 74 | Temp 97.2°F | Resp 14 | Ht 66.0 in | Wt 213.0 lb

## 2014-03-24 DIAGNOSIS — C50919 Malignant neoplasm of unspecified site of unspecified female breast: Secondary | ICD-10-CM

## 2014-03-24 DIAGNOSIS — C773 Secondary and unspecified malignant neoplasm of axilla and upper limb lymph nodes: Secondary | ICD-10-CM

## 2014-03-24 DIAGNOSIS — Z5111 Encounter for antineoplastic chemotherapy: Secondary | ICD-10-CM

## 2014-03-24 LAB — CBC WITH DIFFERENTIAL (CANCER CENTER ONLY)
BASO#: 0 10*3/uL (ref 0.0–0.2)
BASO%: 0.3 % (ref 0.0–2.0)
EOS%: 1.4 % (ref 0.0–7.0)
Eosinophils Absolute: 0.1 10*3/uL (ref 0.0–0.5)
HCT: 42.9 % (ref 34.8–46.6)
HGB: 14.7 g/dL (ref 11.6–15.9)
LYMPH#: 2 10*3/uL (ref 0.9–3.3)
LYMPH%: 23.1 % (ref 14.0–48.0)
MCH: 31.3 pg (ref 26.0–34.0)
MCHC: 34.3 g/dL (ref 32.0–36.0)
MCV: 91 fL (ref 81–101)
MONO#: 0.6 10*3/uL (ref 0.1–0.9)
MONO%: 6.7 % (ref 0.0–13.0)
NEUT%: 68.5 % (ref 39.6–80.0)
NEUTROS ABS: 6 10*3/uL (ref 1.5–6.5)
Platelets: 263 10*3/uL (ref 145–400)
RBC: 4.7 10*6/uL (ref 3.70–5.32)
RDW: 12.6 % (ref 11.1–15.7)
WBC: 8.8 10*3/uL (ref 3.9–10.0)

## 2014-03-24 MED ORDER — LEUPROLIDE ACETATE (4 MONTH) 30 MG IM KIT
30.0000 mg | PACK | Freq: Once | INTRAMUSCULAR | Status: AC
Start: 2014-03-24 — End: 2014-03-24
  Administered 2014-03-24: 30 mg via INTRAMUSCULAR
  Filled 2014-03-24: qty 30

## 2014-03-24 MED ORDER — EXEMESTANE 25 MG PO TABS: 25.0000 mg | ORAL_TABLET | Freq: Every day | ORAL | Status: DC

## 2014-03-24 MED ORDER — OXYCODONE HCL 10 MG PO TABS: 5.0000 mg | ORAL_TABLET | ORAL | Status: DC | PRN

## 2014-03-24 NOTE — Patient Instructions (Signed)

## 2014-03-24 NOTE — Pre-Procedure Instructions (Signed)
SARAYA TIREY  03/24/2014   Your procedure is scheduled on:  Thursday March 26, 2014 at 7:15 AM.  Report to [facility] Admitting at 5:30 AM.  Call this number if you have problems the morning of surgery: [phone]   Remember:   Do not eat food or drink liquids after midnight.   Take these medicines the morning of surgery with A SIP OF WATER: Albuterol inhaler if needed, Oxycodone if needed for pain   STOP taking aspirin and herbal medications 5 days before surgery (Ex. Naproxen (Alleve)   Do not wear jewelry, make-up or nail polish.  Do not wear lotions, powders, or perfumes.   Do not shave 48 hours prior to surgery.   Do not bring valuables to the hospital.  [facility] is not responsible for any belongings or valuables.               Contacts, dentures or bridgework may not be worn into surgery.  Leave suitcase in the car. After surgery it may be brought to your room.  For patients admitted to the hospital, discharge time is determined by your treatment team.               Patients discharged the day of surgery will not be allowed to drive home.  Name and phone number of your driver: Family/Friend  Special Instructions: Shower using CHG soap the night before and the morning of your surgery  Please read over the following fact sheets that you were given: Pain Booklet, Coughing and Deep Breathing and Surgical Site Infection Prevention

## 2014-03-24 NOTE — Telephone Encounter (Signed)
LMOM returning pts call

## 2014-03-24 NOTE — Progress Notes (Signed)
Referral MD  Reason for Referral: Stage IV infiltrating ductal carcinoma the right breast- TRIPLE POSITIVE  Chief Complaint  Patient presents with  . NEW PATIENT  . Injections  : I have breast cancer  HPI: Ms. Bianca Kent is a very charming 48 year old perimenopausal white female. 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 She she has been fairly healthy. She has been getting routine mammograms.  She began having some pain in the right axilla. She has had problems in the past with metastases. It was felt that this may been an abscess. However, the pain continued. She underwent an ultrasound. This did show some enlarged right axillary lymph nodes. There also was a mass in the right breast. This was noted at 1.2 cm in size.  Shieldlike and underwent a biopsy. The biopsy was done on May 19. The pathology report ([id number]) showed an invasive ductal carcinoma. There is lympho-vascular invasion.  She had a prognostic markers. The ER is (+), PR is (+) and she is HER-2 (+). As such, she has a triple positive breast cancer.  She has had studies done for staging. Also, she had a PET scan done. The PET scan does show metastatic disease to the bone and liver. She has a couple of liver lesions. These are small. She has a hypermetabolic lesion in the left iliac region.  She did have a MRI of the breasts bilaterally. The MRI of the breast did show a dominant poker-like mass in the right breast. It measured 1.8 x 2.1 x 2.7 cm. A second lesion is also noted measuring 1.1 x 1.1 x 1.5 cm. There was some enlarged hypervascular bilateral lymph nodes. The right axilla lymph node mass measured 3.5 x 2.4 cm.  She was coming refer to the [facility] for [place] for an evaluation.  She feels previa. She is still working. She does have pain in the right axilla. She has a decent appetite. She's had chronic back pain from her work. She's had no change in bowel bladder habits.  She is going through the change of  life right now. She has irregular cycles.  She has never been pregnant. She started her monthly cycles when she was 48 years old.  Is no history of breast cancer on her mother's side of the family.  Overall, she has an excellent performance status of 0            Past Medical History  Diagnosis Date  . Allergy   . Arthritis   . Chronic kidney disease   . Asthma   . Coronary artery spasm   . Hypertension   :  Past Surgical History  Procedure Laterality Date  . Cholecystectomy    . Knee surgery      right  . Tendon repair      finger  . Tonsillectomy    . Sinus surgery with instatrak      without instatrak  . Tubal ligation    :  Current outpatient prescriptions:albuterol (PROVENTIL HFA;VENTOLIN HFA) 108 (90 BASE) MCG/ACT inhaler, Inhale 2 puffs into the lungs every 4 (four) hours as needed for wheezing or shortness of breath., Disp: , Rfl: ;  aspirin (ASPIRIN EC) 81 MG EC tablet, Take 81 mg by mouth daily. Swallow whole., Disp: , Rfl: ;  diphenhydrAMINE (SOMINEX) 25 MG tablet, Take 100 mg by mouth at bedtime as needed for sleep., Disp: , Rfl:  doxycycline (VIBRAMYCIN) 100 MG capsule, Take 100 mg by mouth 2 (two) times daily., Disp: ,  Rfl: ;  loratadine-pseudoephedrine (CLARITIN-D 24-HOUR) 10-240 MG per 24 hr tablet, Take 1 tablet by mouth daily., Disp: , Rfl: ;  Multiple Vitamin (MULTIVITAMIN) tablet, Take 1 tablet by mouth daily., Disp: , Rfl: ;  naproxen sodium (ANAPROX) 220 MG tablet, Take 220 mg by mouth 2 (two) times daily with a meal., Disp: , Rfl:  exemestane (AROMASIN) 25 MG tablet, Take 1 tablet (25 mg total) by mouth daily after breakfast., Disp: 30 tablet, Rfl: 6;  oxyCODONE 10 MG TABS, Take 0.5-1 tablets (5-10 mg total) by mouth every 4 (four) hours as needed for severe pain., Disp: 90 tablet, Rfl: 0:  :  Allergies  Allergen Reactions  . Iohexol      Code: RASH, Desc: VERY STRONG FAMILY HX OF ANGIOEDEMA WHEN RECEIVING IV CONTRAST; PT HAS BEEN PREMEDICATED  FOR OTHER CONTRASTED STUDIES(IN CATH. LAB)  KR, Onset Date: [id number]   . Prednisone Itching    Capillary beds bust  . Tetanus Toxoids Other (See Comments)    Ran a high fever for 48 hours  . Theophyllines Hives    Mental changes  . Versed [Midazolam] Other (See Comments)    Pt becomes violent  :  Family History  Problem Relation Age of Onset  . Coronary artery disease Mother   . Diabetes Mother   . Hypertension Mother   . Coronary artery disease Father   . Hypertension Father   . Hypertension Sister   . Coronary artery disease Maternal Grandmother   . Stroke Maternal Grandmother   . Depression Maternal Grandmother   . Cancer Maternal Grandmother     colon  . Coronary artery disease Maternal Grandfather   . Stroke Maternal Grandfather   . Depression Maternal Grandfather   . Coronary artery disease Paternal Grandmother   . Cancer Paternal Grandmother   . Cancer Paternal Grandfather     lung  . Hypertension Sister   . Cancer Paternal Aunt     breast  :  History   Social History  . Marital Status: Legally Separated    Spouse Name: N/A    Number of Children: N/A  . Years of Education: N/A   Occupational History  . Not on file.   Social History Main Topics  . Smoking status: Former Smoker -- 0.50 packs/day for 24 years    Types: Cigarettes    Start date: 09/23/1984    Quit date: 10/22/2007  . Smokeless tobacco: Never Used     Comment: QUIT 7 YEARS AGO  . Alcohol Use: Yes  . Drug Use: No  . Sexual Activity: Yes    Birth Control/ Protection: Condom   Other Topics Concern  . Not on file   Social History Narrative  . No narrative on file  :  Pertinent items are noted in HPI.  Exam: _0 @  well-developed and well-nourished white female. She is alert and oriented. Her vital signs show a temperature of 97.2. Pulse 74. Blood pressure 145/75. Weight is 213 pounds. Head and neck exam shows no ocular or oral lesions. There is no scleral icterus. There is  some tenderness to palpation on the right side of the neck. No obvious cervical lymphadenopathy is noted. She does have some enlarged right supraclavicular lymph nodes. These are tender and non-mobile. Lungs are clear bilaterally. She has no rales wheeze or rhonchi. Cardiac exam regular in rhythm with a normal S1 and S2. There are no murmurs rubs or bruits.   breast exam exam shows the left breast with no  obvious masses. There is no left axillary adenopathy. Right breast shows fullness in the right upper quadrant. There is some tenderness in the upper part of the right breast. She has some firm, enlarged right axillary lymph nodes. No erythema noted on the skin. Abdomen is soft. Good bowel sounds. There is no fluid wave. There is a palpable liver or spleen tip. Back exam no tenderness over the spine ribs or hips. Extremities shows no clubbing cyanosis or edema. There is no obvious lymphedema in the right arm. Neurological exam is nonfocal. Skin exam no rashes.    Recent Labs  03/24/14 1147  WBC 8.8  HGB 14.7  HCT 42.9  PLT 263   No results found for this basename: NA, K, CL, CO2, GLUCOSE, BUN, CREATININE, CALCIUM,  in the last 72 hours  Blood smear review: No data  Pathology: See above     Assessment and Plan: Bianca Kent is a 48 year old perimenopausal white female. She has metastatic triple positive breast cancer. This all started in the right breast. She has obvious right axillary lymphadenopathy. The breast primary probably is in the right quadrant of the right breast.  She does not have a lot of systemic disease burden. As such, I really think we can treat her with anti-estrogens therapy. She also will need anti-HER2 therapy. Given that she has a bone marrow, we will use Zometa. We will use Aromasin. I think this would be appropriate. I think that she would have a very good response to this.  She is perimenopausal. I will give her a dose of a Lupron to put her into the menopause. I think  this would be incredibly important to happen if we want to get a good response.  She will get Herceptin and a Perjeta. I talked to her and her mom about this. I gave him information sheets.  I don't think that we have to do any additional testing. I don't think we need a CT or MRI of the brain. She's asymptomatic.  Past medical hour and half with her and her mom. 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  Hopefully, we will get a very good response with antiestrogen therapy.  She will also get a MUGA scan. We will set this up for this week. She will get a Port-A-Cath placed. I told her that she only needed a single-lumen Port-A-Cath.  It was very nice to talk with Ms. Weber. I gave her a prayer blanket. She has a very strong faith. She certainly understands what and she is facing. She knows that she cannot be cured but that  we can give her a very good and very long quality of life.  We will start treatment next week. She did get her dose of Lupron today.    I

## 2014-03-24 NOTE — Telephone Encounter (Signed)
Message copied by Illene Regulus on Tue Mar 24, 2014  3:59 PM ------      Message from: [place], [place]: Tue Mar 24, 2014 10:15 AM      Contact: [phone]       PLEASE CALL HER SHE HAS A QUESTION ABOUT HER PORT CATH ------

## 2014-03-24 NOTE — Progress Notes (Signed)
Nurse called Dr. Cristal Generous office and spoke with Nurse and requested that orders be entered as patient was scheduled for PAT appointment tomorrow at 0800. Nurse stated she would send Dr. Donne Hazel a message and let him know.

## 2014-03-25 ENCOUNTER — Encounter (HOSPITAL_COMMUNITY)
Admission: RE | Admit: 2014-03-25 | Discharge: 2014-03-25 | Disposition: A | Discharge status: Home / Self Care | Payer: 59 | Source: Ambulatory Visit | Attending: General Surgery | Admitting: General Surgery

## 2014-03-25 ENCOUNTER — Encounter (HOSPITAL_COMMUNITY): Payer: Self-pay

## 2014-03-25 HISTORY — DX: Sleep apnea, unspecified: G47.30

## 2014-03-25 HISTORY — DX: Family history of other specified conditions: Z84.89

## 2014-03-25 HISTORY — DX: Pneumonia, unspecified organism: J18.9

## 2014-03-25 HISTORY — DX: Other specified postprocedural states: Z98.890

## 2014-03-25 HISTORY — DX: Other specified postprocedural states: R11.2

## 2014-03-25 HISTORY — DX: Urinary tract infection, site not specified: N39.0

## 2014-03-25 HISTORY — DX: Malignant (primary) neoplasm, unspecified: C80.1

## 2014-03-25 LAB — COMPREHENSIVE METABOLIC PANEL
ALT: 28 U/L (ref 0–35)
AST: 19 U/L (ref 0–37)
Albumin: 4.3 g/dL (ref 3.5–5.2)
Alkaline Phosphatase: 106 U/L (ref 39–117)
BUN: 15 mg/dL (ref 6–23)
CALCIUM: 8.8 mg/dL (ref 8.4–10.5)
CO2: 21 mEq/L (ref 19–32)
Chloride: 105 mEq/L (ref 96–112)
Creatinine, Ser: 0.83 mg/dL (ref 0.50–1.10)
Glucose, Bld: 86 mg/dL (ref 70–99)
Potassium: 4.2 mEq/L (ref 3.5–5.3)
Sodium: 136 mEq/L (ref 135–145)
Total Bilirubin: 0.5 mg/dL (ref 0.2–1.2)
Total Protein: 7.3 g/dL (ref 6.0–8.3)

## 2014-03-25 LAB — CANCER ANTIGEN 27.29: CA 27.29: 32 U/mL (ref 0–39)

## 2014-03-25 LAB — LACTATE DEHYDROGENASE: LDH: 132 U/L (ref 94–250)

## 2014-03-25 MED ORDER — CEFAZOLIN SODIUM-DEXTROSE 2-3 GM-% IV SOLR
2.0000 g | INTRAVENOUS | Status: AC
  Administered 2014-03-26: 2 g via INTRAVENOUS
  Filled 2014-03-25: qty 50

## 2014-03-25 NOTE — Progress Notes (Signed)
Patient informed Nurse that she had a stress test approximately 7 years ago, a cardiac cath x 3 with no PCI (patient stated it was found to be coronary artery spasms), and that she has sleep apnea but does not wear a machine because "it's not that bad". PCP is Arlyss Queen; encounters in [place]. Cardiologist is Dr. Sallyanne Kuster at [facility]. Patient last used Albuterol inhaler about 3-4 months ago. Patient had lab work yesterday and results are in [place]. Nurse informed patient that a Hcg lab needed to be collected since she had a menstrual cycle last week. Patient stated "I received a shot yesterday that completely shut down her ovaries, I had a tubal ligation several years ago, and a hcg lab should not be collected.....Marland Kitchen I know I'm not pregnant."  Nurse cancelled lab per patient request.

## 2014-03-26 ENCOUNTER — Ambulatory Visit (HOSPITAL_COMMUNITY)
Admission: RE | Admit: 2014-03-26 | Discharge: 2014-03-26 | Disposition: A | Discharge status: Home / Self Care | Payer: 59 | Source: Ambulatory Visit | Attending: General Surgery | Admitting: General Surgery

## 2014-03-26 ENCOUNTER — Encounter: Payer: Self-pay | Admitting: Family Medicine

## 2014-03-26 ENCOUNTER — Encounter (HOSPITAL_COMMUNITY): Payer: 59 | Admitting: Vascular Surgery

## 2014-03-26 ENCOUNTER — Ambulatory Visit (HOSPITAL_COMMUNITY): Payer: 59

## 2014-03-26 ENCOUNTER — Encounter (HOSPITAL_COMMUNITY): Payer: Self-pay | Admitting: *Deleted

## 2014-03-26 ENCOUNTER — Ambulatory Visit (HOSPITAL_COMMUNITY): Payer: 59 | Admitting: Certified Registered"

## 2014-03-26 ENCOUNTER — Encounter (HOSPITAL_COMMUNITY)
Admission: RE | Disposition: A | Discharge status: Home / Self Care | Payer: Self-pay | Source: Ambulatory Visit | Attending: General Surgery

## 2014-03-26 DIAGNOSIS — I129 Hypertensive chronic kidney disease with stage 1 through stage 4 chronic kidney disease, or unspecified chronic kidney disease: Secondary | ICD-10-CM | POA: Insufficient documentation

## 2014-03-26 DIAGNOSIS — C50919 Malignant neoplasm of unspecified site of unspecified female breast: Secondary | ICD-10-CM | POA: Insufficient documentation

## 2014-03-26 DIAGNOSIS — G4730 Sleep apnea, unspecified: Secondary | ICD-10-CM | POA: Insufficient documentation

## 2014-03-26 DIAGNOSIS — I2510 Atherosclerotic heart disease of native coronary artery without angina pectoris: Secondary | ICD-10-CM | POA: Insufficient documentation

## 2014-03-26 DIAGNOSIS — Z7982 Long term (current) use of aspirin: Secondary | ICD-10-CM | POA: Insufficient documentation

## 2014-03-26 DIAGNOSIS — J45909 Unspecified asthma, uncomplicated: Secondary | ICD-10-CM | POA: Insufficient documentation

## 2014-03-26 DIAGNOSIS — N189 Chronic kidney disease, unspecified: Secondary | ICD-10-CM | POA: Insufficient documentation

## 2014-03-26 DIAGNOSIS — M129 Arthropathy, unspecified: Secondary | ICD-10-CM | POA: Insufficient documentation

## 2014-03-26 DIAGNOSIS — Z79899 Other long term (current) drug therapy: Secondary | ICD-10-CM | POA: Insufficient documentation

## 2014-03-26 DIAGNOSIS — Z87891 Personal history of nicotine dependence: Secondary | ICD-10-CM | POA: Insufficient documentation

## 2014-03-26 DIAGNOSIS — Z887 Allergy status to serum and vaccine status: Secondary | ICD-10-CM | POA: Insufficient documentation

## 2014-03-26 DIAGNOSIS — Z888 Allergy status to other drugs, medicaments and biological substances status: Secondary | ICD-10-CM | POA: Insufficient documentation

## 2014-03-26 HISTORY — PX: PORTACATH PLACEMENT: SHX2246

## 2014-03-26 SURGERY — INSERTION, TUNNELED CENTRAL VENOUS DEVICE, WITH PORT
Anesthesia: General | Site: Chest | Laterality: Left

## 2014-03-26 MED ORDER — OXYCODONE HCL 5 MG/5ML PO SOLN: 5.0000 mg | Freq: Once | ORAL | Status: DC | PRN

## 2014-03-26 MED ORDER — HYDROMORPHONE HCL PF 1 MG/ML IJ SOLN
0.2500 mg | INTRAMUSCULAR | Status: DC | PRN
  Administered 2014-03-26 (×2): 0.5 mg via INTRAVENOUS

## 2014-03-26 MED ORDER — FENTANYL CITRATE 0.05 MG/ML IJ SOLN
INTRAMUSCULAR | Status: DC | PRN
  Administered 2014-03-26: 100 ug via INTRAVENOUS

## 2014-03-26 MED ORDER — FENTANYL CITRATE 0.05 MG/ML IJ SOLN
INTRAMUSCULAR | Status: AC
  Filled 2014-03-26: qty 5

## 2014-03-26 MED ORDER — MEPERIDINE HCL 25 MG/ML IJ SOLN: 6.2500 mg | INTRAMUSCULAR | Status: DC | PRN

## 2014-03-26 MED ORDER — 0.9 % SODIUM CHLORIDE (POUR BTL) OPTIME
TOPICAL | Status: DC | PRN
  Administered 2014-03-26: 1000 mL

## 2014-03-26 MED ORDER — EPHEDRINE SULFATE 50 MG/ML IJ SOLN
INTRAMUSCULAR | Status: AC
  Filled 2014-03-26: qty 1

## 2014-03-26 MED ORDER — PROPOFOL 10 MG/ML IV BOLUS
INTRAVENOUS | Status: AC
  Filled 2014-03-26: qty 20

## 2014-03-26 MED ORDER — MIDAZOLAM HCL 2 MG/2ML IJ SOLN
INTRAMUSCULAR | Status: AC
  Filled 2014-03-26: qty 2

## 2014-03-26 MED ORDER — ONDANSETRON HCL 4 MG/2ML IJ SOLN
INTRAMUSCULAR | Status: DC | PRN
  Administered 2014-03-26: 4 mg via INTRAVENOUS

## 2014-03-26 MED ORDER — HYDROMORPHONE HCL PF 1 MG/ML IJ SOLN
INTRAMUSCULAR | Status: AC
  Filled 2014-03-26: qty 1

## 2014-03-26 MED ORDER — ARTIFICIAL TEARS OP OINT
TOPICAL_OINTMENT | OPHTHALMIC | Status: AC
  Filled 2014-03-26: qty 3.5

## 2014-03-26 MED ORDER — OXYCODONE HCL 5 MG PO TABS: 5.0000 mg | ORAL_TABLET | Freq: Once | ORAL | Status: DC | PRN

## 2014-03-26 MED ORDER — BUPIVACAINE HCL (PF) 0.25 % IJ SOLN
INTRAMUSCULAR | Status: AC
  Filled 2014-03-26: qty 30

## 2014-03-26 MED ORDER — SODIUM CHLORIDE 0.9 % IJ SOLN
INTRAMUSCULAR | Status: AC
  Filled 2014-03-26: qty 10

## 2014-03-26 MED ORDER — LIDOCAINE HCL (CARDIAC) 20 MG/ML IV SOLN
INTRAVENOUS | Status: DC | PRN
  Administered 2014-03-26: 100 mg via INTRAVENOUS

## 2014-03-26 MED ORDER — OXYCODONE HCL 5 MG PO TABS: 5.0000 mg | ORAL_TABLET | Freq: Four times a day (QID) | ORAL | Status: DC | PRN

## 2014-03-26 MED ORDER — PROPOFOL 10 MG/ML IV BOLUS
INTRAVENOUS | Status: DC | PRN
  Administered 2014-03-26: 200 mg via INTRAVENOUS

## 2014-03-26 MED ORDER — LIDOCAINE HCL (CARDIAC) 20 MG/ML IV SOLN
INTRAVENOUS | Status: AC
  Filled 2014-03-26: qty 5

## 2014-03-26 MED ORDER — HEPARIN SOD (PORK) LOCK FLUSH 100 UNIT/ML IV SOLN
INTRAVENOUS | Status: AC
  Filled 2014-03-26: qty 5

## 2014-03-26 MED ORDER — LACTATED RINGERS IV SOLN
INTRAVENOUS | Status: DC | PRN
  Administered 2014-03-26: 07:00:00 via INTRAVENOUS

## 2014-03-26 MED ORDER — HEPARIN SOD (PORK) LOCK FLUSH 100 UNIT/ML IV SOLN
INTRAVENOUS | Status: DC | PRN
  Administered 2014-03-26: 500 [IU]

## 2014-03-26 MED ORDER — ONDANSETRON HCL 4 MG/2ML IJ SOLN: 4.0000 mg | Freq: Once | INTRAMUSCULAR | Status: DC | PRN

## 2014-03-26 MED ORDER — HEPARIN SODIUM (PORCINE) 5000 UNIT/ML IJ SOLN
INTRAMUSCULAR | Status: DC | PRN
  Administered 2014-03-26: 07:00:00

## 2014-03-26 MED ORDER — BUPIVACAINE HCL (PF) 0.25 % IJ SOLN
INTRAMUSCULAR | Status: DC | PRN
  Administered 2014-03-26: 30 mL

## 2014-03-26 MED ORDER — SUCCINYLCHOLINE CHLORIDE 20 MG/ML IJ SOLN
INTRAMUSCULAR | Status: AC
  Filled 2014-03-26: qty 1

## 2014-03-26 MED ORDER — ONDANSETRON HCL 4 MG/2ML IJ SOLN
INTRAMUSCULAR | Status: AC
  Filled 2014-03-26: qty 2

## 2014-03-26 SURGICAL SUPPLY — 48 items
ADH SKN CLS APL DERMABOND .7 (GAUZE/BANDAGES/DRESSINGS) ×1
BAG DECANTER FOR FLEXI CONT (MISCELLANEOUS) ×2 IMPLANT
BLADE SURG 11 STRL SS (BLADE) ×1 IMPLANT
BLADE SURG 15 STRL LF DISP TIS (BLADE) IMPLANT
BLADE SURG 15 STRL SS (BLADE) ×2
CHLORAPREP W/TINT 26ML (MISCELLANEOUS) ×2 IMPLANT
COVER SURGICAL LIGHT HANDLE (MISCELLANEOUS) ×2 IMPLANT
CRADLE DONUT ADULT HEAD (MISCELLANEOUS) ×2 IMPLANT
DECANTER SPIKE VIAL GLASS SM (MISCELLANEOUS) ×2 IMPLANT
DERMABOND ADVANCED (GAUZE/BANDAGES/DRESSINGS) ×1
DERMABOND ADVANCED .7 DNX12 (GAUZE/BANDAGES/DRESSINGS) ×1 IMPLANT
DRAPE C-ARM 42X72 X-RAY (DRAPES) ×2 IMPLANT
DRAPE LAPAROSCOPIC ABDOMINAL (DRAPES) ×2 IMPLANT
ELECT CAUTERY BLADE 6.4 (BLADE) ×2 IMPLANT
ELECT REM PT RETURN 9FT ADLT (ELECTROSURGICAL) ×2
ELECTRODE REM PT RTRN 9FT ADLT (ELECTROSURGICAL) ×1 IMPLANT
GAUZE SPONGE 4X4 16PLY XRAY LF (GAUZE/BANDAGES/DRESSINGS) ×2 IMPLANT
GLOVE BIO SURGEON STRL SZ7 (GLOVE) ×2 IMPLANT
GLOVE BIOGEL PI IND STRL 7.0 (GLOVE) IMPLANT
GLOVE BIOGEL PI IND STRL 7.5 (GLOVE) ×1 IMPLANT
GLOVE BIOGEL PI INDICATOR 7.0 (GLOVE) ×2
GLOVE BIOGEL PI INDICATOR 7.5 (GLOVE) ×1
GLOVE SS BIOGEL STRL SZ 7.5 (GLOVE) IMPLANT
GLOVE SUPERSENSE BIOGEL SZ 7.5 (GLOVE) ×1
GLOVE SURG SS PI 7.0 STRL IVOR (GLOVE) ×2 IMPLANT
GOWN STRL REUS W/ TWL LRG LVL3 (GOWN DISPOSABLE) ×2 IMPLANT
GOWN STRL REUS W/TWL LRG LVL3 (GOWN DISPOSABLE) ×4
KIT BASIN OR (CUSTOM PROCEDURE TRAY) ×2 IMPLANT
KIT PORT POWER 8FR ISP CVUE (Catheter) ×1 IMPLANT
KIT ROOM TURNOVER OR (KITS) ×2 IMPLANT
NDL HYPO 25GX1X1/2 BEV (NEEDLE) ×1 IMPLANT
NEEDLE HYPO 25GX1X1/2 BEV (NEEDLE) ×4 IMPLANT
NS IRRIG 1000ML POUR BTL (IV SOLUTION) ×2 IMPLANT
PACK SURGICAL SETUP 50X90 (CUSTOM PROCEDURE TRAY) ×2 IMPLANT
PAD ARMBOARD 7.5X6 YLW CONV (MISCELLANEOUS) ×4 IMPLANT
PENCIL BUTTON HOLSTER BLD 10FT (ELECTRODE) ×2 IMPLANT
STAPLER VISISTAT 35W (STAPLE) ×1 IMPLANT
SUT MNCRL AB 4-0 PS2 18 (SUTURE) ×2 IMPLANT
SUT PROLENE 2 0 SH DA (SUTURE) ×2 IMPLANT
SUT VIC AB 3-0 SH 27 (SUTURE) ×2
SUT VIC AB 3-0 SH 27XBRD (SUTURE) ×1 IMPLANT
SYR 20ML ECCENTRIC (SYRINGE) ×4 IMPLANT
SYR 5ML LUER SLIP (SYRINGE) ×2 IMPLANT
SYR CONTROL 10ML LL (SYRINGE) ×1 IMPLANT
TOWEL OR 17X24 6PK STRL BLUE (TOWEL DISPOSABLE) ×2 IMPLANT
TOWEL OR 17X26 10 PK STRL BLUE (TOWEL DISPOSABLE) ×2 IMPLANT
TUBE CONNECTING 12X1/4 (SUCTIONS) ×1 IMPLANT
YANKAUER SUCT BULB TIP NO VENT (SUCTIONS) ×1 IMPLANT

## 2014-03-26 NOTE — Transfer of Care (Signed)
Immediate Anesthesia Transfer of Care Note  Patient: Bianca Kent  Procedure(s) Performed: Procedure(s): INSERTION PORT-A-CATH (Left)  Patient Location: PACU  Anesthesia Type:General  Level of Consciousness: awake, alert  and oriented  Airway & Oxygen Therapy: Patient Spontanous Breathing and Patient connected to nasal cannula oxygen  Post-op Assessment: Report given to PACU RN  Post vital signs: Reviewed and stable  Complications: No apparent anesthesia complications

## 2014-03-26 NOTE — Anesthesia Procedure Notes (Signed)
Procedure Name: LMA Insertion Date/Time: 03/26/2014 7:21 AM Performed by: Sampson Si E Pre-anesthesia Checklist: Patient identified, Emergency Drugs available, Suction available, Patient being monitored and Timeout performed Patient Re-evaluated:Patient Re-evaluated prior to inductionOxygen Delivery Method: Circle system utilized Preoxygenation: Pre-oxygenation with 100% oxygen Intubation Type: IV induction LMA: LMA inserted LMA Size: 4.0 Number of attempts: 1 Placement Confirmation: positive ETCO2 and breath sounds checked- equal and bilateral Tube secured with: Tape Dental Injury: Teeth and Oropharynx as per pre-operative assessment

## 2014-03-26 NOTE — Op Note (Signed)
Preoperative diagnosis: Stage IV breast cancer Postoperative diagnosis: Same as above  Procedure: Left subclavian power port insertion  Surgeon: Dr. Serita Grammes  Anesthesia: Gen.  Estimated blood loss: Minimal  Drains: None  Complications: None  Specimens: None  Sponge and needle count was correct at completion  Disposition to recovery stable   Indications: This is a 45 yof with newly diagnosed triple positive stage IV breast cancer for port placement.  Procedure: After informed consent was obtained the patient was taken to the operating room. She was given cefazolin. Sequential compression devices were on her legs. She was then placed under general anesthesia without complication. She was then appropriately positioned and padded. She was then prepped and draped in the standard sterile surgical fashion. A surgical timeout was then performed.   I was able to access her subclavian vein easily. I then placed the wire. This was confirmed to be in position by fluoroscopy. I then made a pocket below this overlying the pectoralis fascia. I then tunneled the line between the sites. I dilated the tract and then placed the peel-away sheath. The line was placed into the peel-away sheath and pulled back into the distal cava. I secured the port in 2 places with 2-0 Prolene suture. The port flushed easily and aspirated blood. It was packed with heparin. This was then closed with 3-0 Vicryl, 4-0 Monocryl, and Dermabond. She tolerated this well was extubated and transferred to recovery stable.

## 2014-03-26 NOTE — Discharge Instructions (Signed)
° ° °PORT-A-CATH: POST OP INSTRUCTIONS ° °Always review your discharge instruction sheet given to you by the facility where your surgery was performed.  ° °1. A prescription for pain medication may be given to you upon discharge. Take your pain medication as prescribed, if needed. If narcotic pain medicine is not needed, then you make take acetaminophen (Tylenol) or ibuprofen (Advil) as needed.  °2. Take your usually prescribed medications unless otherwise directed. °3. If you need a refill on your pain medication, please contact our office. All narcotic pain medicine now requires a paper prescription.  Phoned in and fax refills are no longer allowed by law.  Prescriptions will not be filled after 5 pm or on weekends.  °4. You should follow a light diet for the remainder of the day after your procedure. °5. Most patients will experience some mild swelling and/or bruising in the area of the incision. It may take several days to resolve. °6. It is common to experience some constipation if taking pain medication after surgery. Increasing fluid intake and taking a stool softener (such as Colace) will usually help or prevent this problem from occurring. A mild laxative (Milk of Magnesia or Miralax) should be taken according to package directions if there are no bowel movements after 48 hours.  °7. Unless discharge instructions indicate otherwise, you may remove your bandages 48 hours after surgery, and you may shower at that time. You may have steri-strips (small white skin tapes) in place directly over the incision.  These strips should be left on the skin for 7-10 days.  If your surgeon used Dermabond (skin glue) on the incision, you may shower in 24 hours.  The glue will flake off over the next 2-3 weeks.  °8. If your port is left accessed at the end of surgery (needle left in port), the dressing cannot get wet and should only by changed by a healthcare professional. When the port is no longer accessed (when the  needle has been removed), follow step 7.   °9. ACTIVITIES:  Limit activity involving your arms for the next 72 hours. Do no strenuous exercise or activity for 1 week. You may drive when you are no longer taking prescription pain medication, you can comfortably wear a seatbelt, and you can maneuver your car. °10.You may need to see your doctor in the office for a follow-up appointment.  Please °      check with your doctor.  °11.When you receive a new Port-a-Cath, you will get a product guide and  °      ID card.  Please keep them in case you need them. ° °WHEN TO CALL YOUR DOCTOR ([phone]): °1. Fever over 101.0 °2. Chills °3. Continued bleeding from incision °4. Increased redness and tenderness at the site °5. Shortness of breath, difficulty breathing ° ° °The clinic staff is available to answer your questions during regular business hours. Please don’t hesitate to call and ask to speak to one of the nurses or medical assistants for clinical concerns. If you have a medical emergency, go to the nearest emergency room or call 911.  A surgeon from [facility] is always on call at the hospital.  ° ° ° °For further information, please visit [email or web address] ° ° ° ° ° °What to eat: ° °For your first meals, you should eat lightly; only small meals initially.  If you do not have nausea, you may eat larger meals.  Avoid spicy, greasy and heavy food.   ° °  General Anesthesia, Adult, Care After  °Refer to this sheet in the next few weeks. These instructions provide you with information on caring for yourself after your procedure. Your health care provider may also give you more specific instructions. Your treatment has been planned according to current medical practices, but problems sometimes occur. Call your health care provider if you have any problems or questions after your procedure.  °WHAT TO EXPECT AFTER THE PROCEDURE  °After the procedure, it is typical to experience:  °Sleepiness.    °Nausea and vomiting. °HOME CARE INSTRUCTIONS  °For the first 24 hours after general anesthesia:  °Have a responsible person with you.  °Do not drive a car. If you are alone, do not take public transportation.  °Do not drink alcohol.  °Do not take medicine that has not been prescribed by your health care provider.  °Do not sign important papers or make important decisions.  °You may resume a normal diet and activities as directed by your health care provider.  °Change bandages (dressings) as directed.  °If you have questions or problems that seem related to general anesthesia, call the hospital and ask for the anesthetist or anesthesiologist on call. °SEEK MEDICAL CARE IF:  °You have nausea and vomiting that continue the day after anesthesia.  °You develop a rash. °SEEK IMMEDIATE MEDICAL CARE IF:  °You have difficulty breathing.  °You have chest pain.  °You have any allergic problems. °Document Released: 01/15/2001 Document Revised: 06/11/2013 Document Reviewed: 04/24/2013  °Exit[employer]® Patient Information ©2014 [employer].  ° ° °

## 2014-03-26 NOTE — Interval H&P Note (Signed)
History and Physical Interval Note:  03/26/2014 6:59 AM  Bianca Kent  has presented today for surgery, with the diagnosis of Breast cancer  The various methods of treatment have been discussed with the patient and family. After consideration of risks, benefits and other options for treatment, the patient has consented to  Procedure(s): INSERTION PORT-A-CATH (N/A) as a surgical intervention .  The patient's history has been reviewed, patient examined, no change in status, stable for surgery.  I have reviewed the patient's chart and labs.  Questions were answered to the patient's satisfaction.     Rolm Bookbinder

## 2014-03-26 NOTE — Anesthesia Preprocedure Evaluation (Addendum)
Anesthesia Evaluation  Patient identified by MRN, date of birth, ID band Patient awake    Reviewed: Allergy & Precautions, H&P , NPO status , Patient's Chart, lab work & pertinent test results  History of Anesthesia Complications (+) PONV  Airway Mallampati: II TM Distance: >3 FB Neck ROM: Full    Dental  (+) Teeth Intact, Dental Advisory Given   Pulmonary asthma , sleep apnea , pneumonia -, former smoker,          Cardiovascular hypertension, Pt. on medications + CAD Rhythm:Regular Rate:Normal     Neuro/Psych  Neuromuscular disease    GI/Hepatic   Endo/Other    Renal/GU      Musculoskeletal   Abdominal   Peds  Hematology   Anesthesia Other Findings   Reproductive/Obstetrics                          Anesthesia Physical Anesthesia Plan  ASA: III  Anesthesia Plan: General   Post-op Pain Management:    Induction: Intravenous  Airway Management Planned: LMA  Additional Equipment:   Intra-op Plan:   Post-operative Plan: Extubation in OR  Informed Consent: I have reviewed the patients History and Physical, chart, labs and discussed the procedure including the risks, benefits and alternatives for the proposed anesthesia with the patient or authorized representative who has indicated his/her understanding and acceptance.     Plan Discussed with: CRNA and Surgeon  Anesthesia Plan Comments:         Anesthesia Quick Evaluation

## 2014-03-26 NOTE — Anesthesia Postprocedure Evaluation (Signed)
Anesthesia Post Note  Patient: Bianca Kent  Procedure(s) Performed: Procedure(s) (LRB): INSERTION PORT-A-CATH (Left)  Anesthesia type: general  Patient location: PACU  Post pain: Pain level controlled  Post assessment: Patient's Cardiovascular Status Stable  Last Vitals:  Filed Vitals:   03/26/14 0845  BP: 122/70  Pulse: 75  Temp:   Resp:     Post vital signs: Reviewed and stable  Level of consciousness: sedated  Complications: No apparent anesthesia complications

## 2014-03-26 NOTE — H&P (View-Only) (Signed)
Patient ID: Bianca Kent, female   DOB: Dec 26, 1965, 48 y.o.   MRN: [id number]  Chief Complaint  Patient presents with  . Other    HPI Bianca Kent is a 48 y.o. female.  Referred by Dr Christene Slates HPI 81 yof who presents with 2 month history of painful right axillary mass that has been enlarging.  MM showed irregular high density axillary node in the right breast.  Breast density is C.  US showed a 2.5 cm right axillary node with cortical thickening and numerous other nodes.  Breast US shows a 1.2 cm mass in the right breast at 12 o clock.  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  Node biopsy shows er pos at 63%, pr pos at 15%, Ki 81% and her 2 pending.  She comes in today complaining of right axillary pain.  Past Medical History  Diagnosis Date  . Allergy   . Arthritis   . Chronic kidney disease   . Asthma   . Coronary artery spasm   . Hypertension     Past Surgical History  Procedure Laterality Date  . Cholecystectomy    . Knee surgery      right  . Tendon repair      finger  . Tonsillectomy    . Sinus surgery with instatrak      without instatrak  . Tubal ligation      Family History  Problem Relation Age of Onset  . Coronary artery disease Mother   . Diabetes Mother   . Hypertension Mother   . Coronary artery disease Father   . Hypertension Father   . Hypertension Sister   . Coronary artery disease Maternal Grandmother   . Stroke Maternal Grandmother   . Depression Maternal Grandmother   . Cancer Maternal Grandmother     colon  . Coronary artery disease Maternal Grandfather   . Stroke Maternal Grandfather   . Depression Maternal Grandfather   . Coronary artery disease Paternal Grandmother   . Cancer Paternal Grandmother   . Cancer Paternal Grandfather     lung  . Hypertension Sister   . Cancer Paternal Aunt     breast    Social History History  Substance Use Topics  . Smoking status: Former Smoker -- 0.25 packs/day   Types: Cigarettes    Quit date: 02/19/2014  . Smokeless tobacco: Not on file  . Alcohol Use: Yes    Allergies  Allergen Reactions  . Iohexol      Code: RASH, Desc: VERY STRONG FAMILY HX OF ANGIOEDEMA WHEN RECEIVING IV CONTRAST; PT HAS BEEN PREMEDICATED FOR OTHER CONTRASTED STUDIES(IN CATH. LAB)  KR, Onset Date: [id number]   . Prednisone Itching  . Tetanus Toxoids Other (See Comments)    Ran a high fever for 48 hours  . Theophyllines Hives  . Versed [Midazolam] Other (See Comments)    Pt becomes violent    Current Outpatient Prescriptions  Medication Sig Dispense Refill  . albuterol (PROVENTIL HFA;VENTOLIN HFA) 108 (90 BASE) MCG/ACT inhaler Inhale 2 puffs into the lungs every 4 (four) hours as needed for wheezing or shortness of breath (cough, shortness of breath or wheezing.).  1 Inhaler  1  . aspirin 81 MG tablet Take 81 mg by mouth daily.      Marland Kitchen doxycycline (VIBRAMYCIN) 100 MG capsule Take 1 capsule (100 mg total) by mouth 2 (two) times daily.  20 capsule  0  . HYDROcodone-acetaminophen (NORCO/VICODIN) 5-325 MG per  tablet Take 1 every 6 to 8 hours as needed for pain.  Do not drive on this medication.  30 tablet  0  . loratadine-pseudoephedrine (CLARITIN-D 24-HOUR) 10-240 MG per 24 hr tablet Take 1 tablet by mouth daily.      . Multiple Vitamin (MULTIVITAMIN) tablet Take 1 tablet by mouth daily.      . naproxen sodium (ANAPROX) 220 MG tablet Take 220 mg by mouth 2 (two) times daily with a meal.       Current Facility-Administered Medications  Medication Dose Route Frequency Provider Last Rate Last Dose  . albuterol (PROVENTIL) (2.5 MG/3ML) 0.083% nebulizer solution 2.5 mg  2.5 mg Nebulization Once Collene Leyden, PA-C        Review of Systems Review of Systems  Constitutional: Negative for fever, chills and unexpected weight change.  HENT: Positive for congestion. Negative for hearing loss, sore throat, trouble swallowing and voice change.   Eyes: Negative for visual  disturbance.  Respiratory: Negative for cough and wheezing.   Cardiovascular: Negative for chest pain, palpitations and leg swelling.  Gastrointestinal: Negative for nausea, vomiting, abdominal pain, diarrhea, constipation, blood in stool, abdominal distention and anal bleeding.  Genitourinary: Negative for hematuria, vaginal bleeding and difficulty urinating.  Musculoskeletal: Negative for arthralgias.  Skin: Negative for rash and wound.  Neurological: Negative for seizures, syncope and headaches.  Hematological: Negative for adenopathy. Does not bruise/bleed easily.  Psychiatric/Behavioral: Negative for confusion.    Blood pressure 160/100, pulse 86, temperature 98.5 F (36.9 C), temperature source Oral, resp. rate 15, height 5\' 7"  (1.702 m), weight 214 lb (97.07 kg).  Physical Exam Physical Exam  Vitals reviewed. Constitutional: She appears well-developed and well-nourished.  Lymphadenopathy:    She has no cervical adenopathy.    She has axillary adenopathy.       Right axillary: Pectoral and lateral (multiple enlarged fixed axillary nodes) adenopathy present.       Right: No supraclavicular adenopathy present.       Left: No supraclavicular adenopathy present.    Data Reviewed Mm, [place], path reviewed  Assessment    Likely clinical stage III breast cancer     Plan   PET scan due to stage III disease on exam, MRI later this week, Oncology appt, cards evaluation, port placement for primary systemic chemotherapy  We discussed the staging and pathophysiology of breast cancer. We discussed all of the different options for treatment for breast cancer including surgery, chemotherapy, radiation therapy, possiblyHerceptin, and antiestrogen therapy.  I think due to stage III disease clinically now and my concern for possibly even having stage IV disease would be best to treat systemically. I think would be difficult to do alnd now and her primary therapy should be systemic at this  point. We discussed role of mr in deciding surgery later in chemo course, pet to get baseline and rule out stage IV disease, oncology eval and cardiology follow up given history of coronary spasm, htn and her bp is elevated today.  She is former patient of Dr Dorris Fetch.   We discussed port placement with risks /benefits also.          Rolm Bookbinder 03/17/2014, 8:48 AM

## 2014-03-27 ENCOUNTER — Encounter (HOSPITAL_COMMUNITY): Payer: Self-pay | Admitting: General Surgery

## 2014-03-29 NOTE — Progress Notes (Signed)
Recheck LFTs in 4 weeks and refer to PCP

## 2014-03-30 ENCOUNTER — Telehealth: Payer: Self-pay | Admitting: Hematology & Oncology

## 2014-03-30 ENCOUNTER — Telehealth: Payer: Self-pay | Admitting: *Deleted

## 2014-03-30 ENCOUNTER — Other Ambulatory Visit: Payer: 59

## 2014-03-30 NOTE — Telephone Encounter (Signed)
Left vm for pt to return call to discuss navigation resources. Contact information given.

## 2014-03-30 NOTE — Telephone Encounter (Signed)
ToJaclyn Shaggy  Fx:  [phone] Ph:  [phone] ext [phone]   Please review patient below for CASE MGMT. CASE ID: [id number]  PATIENT: Bianca Kent DOB:  [birth year] MRN:  [id number]  Phone:  [phone] ID:  [id number] GRP:     [id number] DX:      Breast cancer metastasized to multiple sites -  174.9, 199.0      Jcodes: J9355 Herceptin & [id number] Perjeta Dos:  04/02/2014

## 2014-03-31 ENCOUNTER — Encounter (INDEPENDENT_AMBULATORY_CARE_PROVIDER_SITE_OTHER): Payer: Commercial Managed Care - PPO | Admitting: General Surgery

## 2014-03-31 ENCOUNTER — Telehealth: Payer: Self-pay | Admitting: *Deleted

## 2014-03-31 ENCOUNTER — Other Ambulatory Visit: Payer: Self-pay | Admitting: *Deleted

## 2014-03-31 DIAGNOSIS — C50919 Malignant neoplasm of unspecified site of unspecified female breast: Secondary | ICD-10-CM

## 2014-03-31 MED ORDER — LIDOCAINE-PRILOCAINE 2.5-2.5 % EX CREA: 1.0000 "application " | TOPICAL_CREAM | CUTANEOUS | Status: DC | PRN

## 2014-03-31 NOTE — Telephone Encounter (Signed)
Spoke to pt about care plan and navigation resources.  Pt relayed she needed EMLA cream sent to pharmacy.  Called [facility] and request from Dr. Antonieta Pert nurse to send to pharmacy ([facility]). Pt denies needs at this time. Encourage pt to call with questions or concerns.  Received verbal understanding.  Contact information given.

## 2014-04-01 ENCOUNTER — Ambulatory Visit (HOSPITAL_COMMUNITY)
Admission: RE | Admit: 2014-04-01 | Discharge: 2014-04-01 | Disposition: A | Discharge status: Home / Self Care | Payer: 59 | Source: Ambulatory Visit | Attending: Hematology & Oncology | Admitting: Hematology & Oncology

## 2014-04-01 DIAGNOSIS — Z01818 Encounter for other preprocedural examination: Secondary | ICD-10-CM | POA: Insufficient documentation

## 2014-04-01 DIAGNOSIS — C50919 Malignant neoplasm of unspecified site of unspecified female breast: Secondary | ICD-10-CM | POA: Insufficient documentation

## 2014-04-01 MED ORDER — TECHNETIUM TC 99M-LABELED RED BLOOD CELLS IV KIT: 25.0000 | PACK | Freq: Once | INTRAVENOUS | Status: AC | PRN

## 2014-04-01 MED ORDER — HEPARIN SOD (PORK) LOCK FLUSH 100 UNIT/ML IV SOLN: 500.0000 [IU] | Freq: Once | INTRAVENOUS | Status: DC

## 2014-04-01 NOTE — Progress Notes (Signed)
OK for primary prevention

## 2014-04-02 ENCOUNTER — Other Ambulatory Visit: Payer: Self-pay | Admitting: Nurse Practitioner

## 2014-04-02 ENCOUNTER — Other Ambulatory Visit: Payer: 59 | Admitting: Lab

## 2014-04-02 ENCOUNTER — Ambulatory Visit (HOSPITAL_BASED_OUTPATIENT_CLINIC_OR_DEPARTMENT_OTHER): Payer: 59

## 2014-04-02 ENCOUNTER — Other Ambulatory Visit: Payer: Self-pay

## 2014-04-02 VITALS — BP 130/89 | HR 81 | Temp 97.6°F | Resp 16

## 2014-04-02 DIAGNOSIS — C7951 Secondary malignant neoplasm of bone: Secondary | ICD-10-CM

## 2014-04-02 DIAGNOSIS — C50919 Malignant neoplasm of unspecified site of unspecified female breast: Secondary | ICD-10-CM

## 2014-04-02 DIAGNOSIS — C773 Secondary and unspecified malignant neoplasm of axilla and upper limb lymph nodes: Secondary | ICD-10-CM

## 2014-04-02 DIAGNOSIS — C787 Secondary malignant neoplasm of liver and intrahepatic bile duct: Secondary | ICD-10-CM

## 2014-04-02 DIAGNOSIS — C7952 Secondary malignant neoplasm of bone marrow: Secondary | ICD-10-CM

## 2014-04-02 DIAGNOSIS — Z5112 Encounter for antineoplastic immunotherapy: Secondary | ICD-10-CM

## 2014-04-02 MED ORDER — HEPARIN SOD (PORK) LOCK FLUSH 100 UNIT/ML IV SOLN
500.0000 [IU] | Freq: Once | INTRAVENOUS | Status: DC | PRN
  Filled 2014-04-02: qty 5

## 2014-04-02 MED ORDER — PROMETHAZINE HCL 25 MG/ML IJ SOLN
INTRAMUSCULAR | Status: AC
  Filled 2014-04-02: qty 1

## 2014-04-02 MED ORDER — SODIUM CHLORIDE 0.9 % IV SOLN
Freq: Once | INTRAVENOUS | Status: AC
  Administered 2014-04-02: 10:00:00 via INTRAVENOUS

## 2014-04-02 MED ORDER — DIPHENHYDRAMINE HCL 25 MG PO CAPS
ORAL_CAPSULE | ORAL | Status: AC
  Filled 2014-04-02: qty 2

## 2014-04-02 MED ORDER — SODIUM CHLORIDE 0.9 % IV SOLN
840.0000 mg | Freq: Once | INTRAVENOUS | Status: AC
  Administered 2014-04-02: 840 mg via INTRAVENOUS
  Filled 2014-04-02: qty 28

## 2014-04-02 MED ORDER — SODIUM CHLORIDE 0.9 % IJ SOLN
10.0000 mL | INTRAMUSCULAR | Status: DC | PRN
  Administered 2014-04-02: 10 mL
  Filled 2014-04-02: qty 10

## 2014-04-02 MED ORDER — LORAZEPAM 0.5 MG PO TABS: 0.5000 mg | ORAL_TABLET | Freq: Three times a day (TID) | ORAL | Status: DC

## 2014-04-02 MED ORDER — ZOLEDRONIC ACID 4 MG/100ML IV SOLN
4.0000 mg | Freq: Once | INTRAVENOUS | Status: AC
  Administered 2014-04-02: 4 mg via INTRAVENOUS
  Filled 2014-04-02: qty 100

## 2014-04-02 MED ORDER — SODIUM CHLORIDE 0.9 % IV SOLN: Freq: Once | INTRAVENOUS | Status: DC

## 2014-04-02 MED ORDER — PROCHLORPERAZINE MALEATE 10 MG PO TABS: 10.0000 mg | ORAL_TABLET | Freq: Four times a day (QID) | ORAL | Status: DC | PRN

## 2014-04-02 MED ORDER — ACETAMINOPHEN 325 MG PO TABS
650.0000 mg | ORAL_TABLET | Freq: Once | ORAL | Status: AC
  Administered 2014-04-02: 650 mg via ORAL

## 2014-04-02 MED ORDER — ACETAMINOPHEN 325 MG PO TABS
ORAL_TABLET | ORAL | Status: AC
  Filled 2014-04-02: qty 2

## 2014-04-02 MED ORDER — HEPARIN SOD (PORK) LOCK FLUSH 100 UNIT/ML IV SOLN
500.0000 [IU] | Freq: Once | INTRAVENOUS | Status: AC | PRN
  Administered 2014-04-02: 500 [IU]
  Filled 2014-04-02: qty 5

## 2014-04-02 MED ORDER — PROMETHAZINE HCL 25 MG/ML IJ SOLN
25.0000 mg | Freq: Four times a day (QID) | INTRAMUSCULAR | Status: DC | PRN
  Administered 2014-04-02: 25 mg via INTRAVENOUS

## 2014-04-02 MED ORDER — DIPHENHYDRAMINE HCL 25 MG PO CAPS
50.0000 mg | ORAL_CAPSULE | Freq: Once | ORAL | Status: AC
  Administered 2014-04-02: 50 mg via ORAL

## 2014-04-02 MED ORDER — TRASTUZUMAB CHEMO INJECTION 440 MG
8.0000 mg/kg | Freq: Once | INTRAVENOUS | Status: AC
  Administered 2014-04-02: 777 mg via INTRAVENOUS
  Filled 2014-04-02: qty 37

## 2014-04-02 NOTE — Progress Notes (Signed)
Upon d/c pt began to feel nauseated. Dr [name] to bedside. Phenergan given as ordered. NS infusing. Pt d/c when nausea resolved. dph

## 2014-04-02 NOTE — Patient Instructions (Signed)
[facility] Discharge Instructions for Patients Receiving Chemotherapy  Today you received the following chemotherapy agents Herceptin, Perjeta, Zometa  To help prevent nausea and vomiting after your treatment, we encourage you to take your nausea medication as prescribed.   If you develop nausea and vomiting that is not controlled by your nausea medication, call the clinic.   BELOW ARE SYMPTOMS THAT SHOULD BE REPORTED IMMEDIATELY:  *FEVER GREATER THAN 100.5 F  *CHILLS WITH OR WITHOUT FEVER  NAUSEA AND VOMITING THAT IS NOT CONTROLLED WITH YOUR NAUSEA MEDICATION  *UNUSUAL SHORTNESS OF BREATH  *UNUSUAL BRUISING OR BLEEDING  TENDERNESS IN MOUTH AND THROAT WITH OR WITHOUT PRESENCE OF ULCERS  *URINARY PROBLEMS  *BOWEL PROBLEMS  UNUSUAL RASH Items with * indicate a potential emergency and should be followed up as soon as possible.  Feel free to call the clinic you have any questions or concerns. The clinic phone number is [phone].   Zoledronic Acid injection (Hypercalcemia, Oncology) What is this medicine? ZOLEDRONIC ACID (ZOE le dron ik AS id) lowers the amount of calcium loss from bone. It is used to treat too much calcium in your blood from cancer. It is also used to prevent complications of cancer that has spread to the bone. This medicine may be used for other purposes; ask your health care provider or pharmacist if you have questions. COMMON BRAND NAME(S): Zometa What should I tell my health care provider before I take this medicine? They need to know if you have any of these conditions: -aspirin-sensitive asthma -cancer, especially if you are receiving medicines used to treat cancer -dental disease or wear dentures -infection -kidney disease -receiving corticosteroids like dexamethasone or prednisone -an unusual or allergic reaction to zoledronic acid, other medicines, foods, dyes, or preservatives -pregnant or trying to get  pregnant -breast-feeding How should I use this medicine? This medicine is for infusion into a vein. It is given by a health care professional in a hospital or clinic setting. Talk to your pediatrician regarding the use of this medicine in children. Special care may be needed. Overdosage: If you think you have taken too much of this medicine contact a poison control center or emergency room at once. NOTE: This medicine is only for you. Do not share this medicine with others. What if I miss a dose? It is important not to miss your dose. Call your doctor or health care professional if you are unable to keep an appointment. What may interact with this medicine? -certain antibiotics given by injection -NSAIDs, medicines for pain and inflammation, like ibuprofen or naproxen -some diuretics like bumetanide, furosemide -teriparatide -thalidomide This list may not describe all possible interactions. Give your health care provider a list of all the medicines, herbs, non-prescription drugs, or dietary supplements you use. Also tell them if you smoke, drink alcohol, or use illegal drugs. Some items may interact with your medicine. What should I watch for while using this medicine? Visit your doctor or health care professional for regular checkups. It may be some time before you see the benefit from this medicine. Do not stop taking your medicine unless your doctor tells you to. Your doctor may order blood tests or other tests to see how you are doing. Women should inform their doctor if they wish to become pregnant or think they might be pregnant. There is a potential for serious side effects to an unborn child. Talk to your health care professional or pharmacist for more information. You should make sure that  you get enough calcium and vitamin D while you are taking this medicine. Discuss the foods you eat and the vitamins you take with your health care professional. Some people who take this medicine have  severe bone, joint, and/or muscle pain. This medicine may also increase your risk for jaw problems or a broken thigh bone. Tell your doctor right away if you have severe pain in your jaw, bones, joints, or muscles. Tell your doctor if you have any pain that does not go away or that gets worse. Tell your dentist and dental surgeon that you are taking this medicine. You should not have major dental surgery while on this medicine. See your dentist to have a dental exam and fix any dental problems before starting this medicine. Take good care of your teeth while on this medicine. Make sure you see your dentist for regular follow-up appointments. What side effects may I notice from receiving this medicine? Side effects that you should report to your doctor or health care professional as soon as possible: -allergic reactions like skin rash, itching or hives, swelling of the face, lips, or tongue -anxiety, confusion, or depression -breathing problems -changes in vision -eye pain -feeling faint or lightheaded, falls -jaw pain, especially after dental work -mouth sores -muscle cramps, stiffness, or weakness -trouble passing urine or change in the amount of urine Side effects that usually do not require medical attention (report to your doctor or health care professional if they continue or are bothersome): -bone, joint, or muscle pain -constipation -diarrhea -fever -hair loss -irritation at site where injected -loss of appetite -nausea, vomiting -stomach upset -trouble sleeping -trouble swallowing -weak or tired This list may not describe all possible side effects. Call your doctor for medical advice about side effects. You may report side effects to FDA at 1-800-FDA-1088. Where should I keep my medicine? This drug is given in a hospital or clinic and will not be stored at home. NOTE: This sheet is a summary. It may not cover all possible information. If you have questions about this medicine,  talk to your doctor, pharmacist, or health care provider.  2014, [employer]. (2013-03-20 13:03:13) Pertuzumab injection What is this medicine? PERTUZUMAB (per TOOZ ue mab) is a monoclonal antibody that targets a protein called HER2. HER2 is found in some breast cancers. This medicine can stop cancer cell growth. This medicine is used with other cancer treatments. This medicine may be used for other purposes; ask your health care provider or pharmacist if you have questions. COMMON BRAND NAME(S): PERJETA What should I tell my health care provider before I take this medicine? They need to know if you have any of these conditions: -heart disease -heart failure -high blood pressure -history of irregular heart beat -recent or ongoing radiation therapy -an unusual or allergic reaction to pertuzumab, other medicines, foods, dyes, or preservatives -pregnant or trying to get pregnant -breast-feeding How should I use this medicine? This medicine is for infusion into a vein. It is given by a health care professional in a hospital or clinic setting. Talk to your pediatrician regarding the use of this medicine in children. Special care may be needed. Overdosage: If you think you've taken too much of this medicine contact a poison control center or emergency room at once. Overdosage: If you think you have taken too much of this medicine contact a poison control center or emergency room at once. NOTE: This medicine is only for you. Do not share this medicine with others. What if  I miss a dose? It is important not to miss your dose. Call your doctor or health care professional if you are unable to keep an appointment. What may interact with this medicine? Interactions are not expected. Give your health care provider a list of all the medicines, herbs, non-prescription drugs, or dietary supplements you use. Also tell them if you smoke, drink alcohol, or use illegal drugs. Some items may  interact with your medicine. This list may not describe all possible interactions. Give your health care provider a list of all the medicines, herbs, non-prescription drugs, or dietary supplements you use. Also tell them if you smoke, drink alcohol, or use illegal drugs. Some items may interact with your medicine. What should I watch for while using this medicine? Your condition will be monitored carefully while you are receiving this medicine. Report any side effects. Continue your course of treatment even though you feel ill unless your doctor tells you to stop. Do not become pregnant while taking this medicine. Women should inform their doctor if they wish to become pregnant or think they might be pregnant. There is a potential for serious side effects to an unborn child. Talk to your health care professional or pharmacist for more information. Do not breast-feed an infant while taking this medicine. Call your doctor or health care professional for advice if you get a fever, chills or sore throat, or other symptoms of a cold or flu. Do not treat yourself. Try to avoid being around people who are sick. You may experience fever, chills, and headache during the infusion. Report any side effects during the infusion to your health care professional. What side effects may I notice from receiving this medicine? Side effects that you should report to your doctor or health care professional as soon as possible: -breathing problems -chest pain or palpitations -dizziness -feeling faint or lightheaded -fever or chills -skin rash, itching or hives -sore throat -swelling of the face, lips, or tongue -swelling of the legs or ankles -unusually weak or tired  Side effects that usually do not require medical attention (Report these to your doctor or health care professional if they continue or are bothersome.): -diarrhea -hair loss -nausea, vomiting -tiredness This list may not describe all possible side  effects. Call your doctor for medical advice about side effects. You may report side effects to FDA at 1-800-FDA-1088. Where should I keep my medicine? This drug is given in a hospital or clinic and will not be stored at home. NOTE: This sheet is a summary. It may not cover all possible information. If you have questions about this medicine, talk to your doctor, pharmacist, or health care provider.  2014, [employer]. (2012-08-07 16:54:15) Trastuzumab injection for infusion What is this medicine? TRASTUZUMAB (tras TOO zoo mab) is a monoclonal antibody. It targets a protein called HER2. This protein is found in some stomach and breast cancers. This medicine can stop cancer cell growth. This medicine may be used with other cancer treatments. This medicine may be used for other purposes; ask your health care provider or pharmacist if you have questions. COMMON BRAND NAME(S): Herceptin What should I tell my health care provider before I take this medicine? They need to know if you have any of these conditions: -heart disease -heart failure -infection (especially a virus infection such as chickenpox, cold sores, or herpes) -lung or breathing disease, like asthma -recent or ongoing radiation therapy -an unusual or allergic reaction to trastuzumab, benzyl alcohol, or other medications, foods, dyes,  or preservatives -pregnant or trying to get pregnant -breast-feeding How should I use this medicine? This drug is given as an infusion into a vein. It is administered in a hospital or clinic by a specially trained health care professional. Talk to your pediatrician regarding the use of this medicine in children. This medicine is not approved for use in children. Overdosage: If you think you have taken too much of this medicine contact a poison control center or emergency room at once. NOTE: This medicine is only for you. Do not share this medicine with others. What if I miss a dose? It is  important not to miss a dose. Call your doctor or health care professional if you are unable to keep an appointment. What may interact with this medicine? -cyclophosphamide -doxorubicin -warfarin This list may not describe all possible interactions. Give your health care provider a list of all the medicines, herbs, non-prescription drugs, or dietary supplements you use. Also tell them if you smoke, drink alcohol, or use illegal drugs. Some items may interact with your medicine. What should I watch for while using this medicine? Visit your doctor for checks on your progress. Report any side effects. Continue your course of treatment even though you feel ill unless your doctor tells you to stop. Call your doctor or health care professional for advice if you get a fever, chills or sore throat, or other symptoms of a cold or flu. Do not treat yourself. Try to avoid being around people who are sick. You may experience fever, chills and shaking during your first infusion. These effects are usually mild and can be treated with other medicines. Report any side effects during the infusion to your health care professional. Fever and chills usually do not happen with later infusions. What side effects may I notice from receiving this medicine? Side effects that you should report to your doctor or other health care professional as soon as possible: -breathing difficulties -chest pain or palpitations -cough -dizziness or fainting -fever or chills, sore throat -skin rash, itching or hives -swelling of the legs or ankles -unusually weak or tired Side effects that usually do not require medical attention (report to your doctor or other health care professional if they continue or are bothersome): -loss of appetite -headache -muscle aches -nausea This list may not describe all possible side effects. Call your doctor for medical advice about side effects. You may report side effects to FDA at  1-800-FDA-1088. Where should I keep my medicine? This drug is given in a hospital or clinic and will not be stored at home. NOTE: This sheet is a summary. It may not cover all possible information. If you have questions about this medicine, talk to your doctor, pharmacist, or health care provider.  2014, [employer] Standard. (2009-08-13 13:43:15)

## 2014-04-07 ENCOUNTER — Encounter: Payer: Self-pay | Admitting: *Deleted

## 2014-04-23 ENCOUNTER — Ambulatory Visit (HOSPITAL_BASED_OUTPATIENT_CLINIC_OR_DEPARTMENT_OTHER): Payer: 59

## 2014-04-23 ENCOUNTER — Encounter: Payer: Self-pay | Admitting: Hematology & Oncology

## 2014-04-23 ENCOUNTER — Other Ambulatory Visit (HOSPITAL_BASED_OUTPATIENT_CLINIC_OR_DEPARTMENT_OTHER): Payer: 59 | Admitting: Lab

## 2014-04-23 ENCOUNTER — Ambulatory Visit (HOSPITAL_BASED_OUTPATIENT_CLINIC_OR_DEPARTMENT_OTHER): Payer: 59 | Admitting: Hematology & Oncology

## 2014-04-23 VITALS — BP 125/73 | HR 84 | Temp 97.9°F | Resp 14 | Ht 66.0 in | Wt 209.0 lb

## 2014-04-23 DIAGNOSIS — Z170 Estrogen receptor positive status [ER+]: Secondary | ICD-10-CM

## 2014-04-23 DIAGNOSIS — C773 Secondary and unspecified malignant neoplasm of axilla and upper limb lymph nodes: Secondary | ICD-10-CM

## 2014-04-23 DIAGNOSIS — Z5112 Encounter for antineoplastic immunotherapy: Secondary | ICD-10-CM

## 2014-04-23 DIAGNOSIS — C50919 Malignant neoplasm of unspecified site of unspecified female breast: Secondary | ICD-10-CM

## 2014-04-23 DIAGNOSIS — C7951 Secondary malignant neoplasm of bone: Secondary | ICD-10-CM

## 2014-04-23 DIAGNOSIS — G4701 Insomnia due to medical condition: Secondary | ICD-10-CM

## 2014-04-23 DIAGNOSIS — C7952 Secondary malignant neoplasm of bone marrow: Secondary | ICD-10-CM

## 2014-04-23 LAB — CBC WITH DIFFERENTIAL (CANCER CENTER ONLY)
BASO#: 0 10*3/uL (ref 0.0–0.2)
BASO%: 0.4 % (ref 0.0–2.0)
EOS%: 3.7 % (ref 0.0–7.0)
Eosinophils Absolute: 0.3 10*3/uL (ref 0.0–0.5)
HEMATOCRIT: 44.7 % (ref 34.8–46.6)
HGB: 15.4 g/dL (ref 11.6–15.9)
LYMPH#: 1.9 10*3/uL (ref 0.9–3.3)
LYMPH%: 25.5 % (ref 14.0–48.0)
MCH: 31.1 pg (ref 26.0–34.0)
MCHC: 34.5 g/dL (ref 32.0–36.0)
MCV: 90 fL (ref 81–101)
MONO#: 0.6 10*3/uL (ref 0.1–0.9)
MONO%: 7.9 % (ref 0.0–13.0)
NEUT#: 4.5 10*3/uL (ref 1.5–6.5)
NEUT%: 62.5 % (ref 39.6–80.0)
PLATELETS: 273 10*3/uL (ref 145–400)
RBC: 4.95 10*6/uL (ref 3.70–5.32)
RDW: 12.8 % (ref 11.1–15.7)
WBC: 7.3 10*3/uL (ref 3.9–10.0)

## 2014-04-23 LAB — CMP (CANCER CENTER ONLY)
ALBUMIN: 3.7 g/dL (ref 3.3–5.5)
ALT(SGPT): 37 U/L (ref 10–47)
AST: 26 U/L (ref 11–38)
Alkaline Phosphatase: 119 U/L — ABNORMAL HIGH (ref 26–84)
BUN: 13 mg/dL (ref 7–22)
CALCIUM: 9.1 mg/dL (ref 8.0–10.3)
CO2: 26 mEq/L (ref 18–33)
CREATININE: 0.8 mg/dL (ref 0.6–1.2)
Chloride: 102 mEq/L (ref 98–108)
GLUCOSE: 111 mg/dL (ref 73–118)
POTASSIUM: 3.4 meq/L (ref 3.3–4.7)
Sodium: 139 mEq/L (ref 128–145)
Total Bilirubin: 0.5 mg/dl (ref 0.20–1.60)
Total Protein: 7.5 g/dL (ref 6.4–8.1)

## 2014-04-23 LAB — CANCER ANTIGEN 27.29: CA 27.29: 30 U/mL (ref 0–39)

## 2014-04-23 LAB — LACTATE DEHYDROGENASE: LDH: 152 U/L (ref 94–250)

## 2014-04-23 MED ORDER — HEPARIN SOD (PORK) LOCK FLUSH 100 UNIT/ML IV SOLN
250.0000 [IU] | Freq: Once | INTRAVENOUS | Status: DC | PRN
  Filled 2014-04-23: qty 5

## 2014-04-23 MED ORDER — HEPARIN SOD (PORK) LOCK FLUSH 100 UNIT/ML IV SOLN
500.0000 [IU] | Freq: Once | INTRAVENOUS | Status: DC | PRN
  Filled 2014-04-23: qty 5

## 2014-04-23 MED ORDER — ZOLEDRONIC ACID 4 MG/100ML IV SOLN
4.0000 mg | Freq: Once | INTRAVENOUS | Status: AC
  Administered 2014-04-23: 4 mg via INTRAVENOUS
  Filled 2014-04-23: qty 100

## 2014-04-23 MED ORDER — SODIUM CHLORIDE 0.9 % IJ SOLN
3.0000 mL | Freq: Once | INTRAMUSCULAR | Status: DC | PRN
  Filled 2014-04-23: qty 10

## 2014-04-23 MED ORDER — ACETAMINOPHEN 325 MG PO TABS
650.0000 mg | ORAL_TABLET | Freq: Once | ORAL | Status: AC
  Administered 2014-04-23: 650 mg via ORAL

## 2014-04-23 MED ORDER — TRASTUZUMAB CHEMO INJECTION 440 MG
6.0000 mg/kg | Freq: Once | INTRAVENOUS | Status: AC
  Administered 2014-04-23: 588 mg via INTRAVENOUS
  Filled 2014-04-23: qty 28

## 2014-04-23 MED ORDER — SODIUM CHLORIDE 0.9 % IJ SOLN
10.0000 mL | INTRAMUSCULAR | Status: DC | PRN
  Filled 2014-04-23: qty 10

## 2014-04-23 MED ORDER — DIPHENHYDRAMINE HCL 25 MG PO CAPS
ORAL_CAPSULE | ORAL | Status: AC
  Filled 2014-04-23: qty 2

## 2014-04-23 MED ORDER — DIPHENHYDRAMINE HCL 25 MG PO CAPS
50.0000 mg | ORAL_CAPSULE | Freq: Once | ORAL | Status: AC
  Administered 2014-04-23: 50 mg via ORAL

## 2014-04-23 MED ORDER — SODIUM CHLORIDE 0.9 % IV SOLN: Freq: Once | INTRAVENOUS | Status: DC

## 2014-04-23 MED ORDER — ACETAMINOPHEN 325 MG PO TABS
ORAL_TABLET | ORAL | Status: AC
  Filled 2014-04-23: qty 2

## 2014-04-23 MED ORDER — SODIUM CHLORIDE 0.9 % IV SOLN
420.0000 mg | Freq: Once | INTRAVENOUS | Status: AC
  Administered 2014-04-23: 420 mg via INTRAVENOUS
  Filled 2014-04-23: qty 14

## 2014-04-23 MED ORDER — SODIUM CHLORIDE 0.9 % IV SOLN
Freq: Once | INTRAVENOUS | Status: AC
  Administered 2014-04-23: 14:00:00 via INTRAVENOUS

## 2014-04-23 MED ORDER — ALTEPLASE 2 MG IJ SOLR
2.0000 mg | Freq: Once | INTRAMUSCULAR | Status: DC | PRN
  Filled 2014-04-23: qty 2

## 2014-04-23 MED ORDER — ALPRAZOLAM 1 MG PO TABS: 1.0000 mg | ORAL_TABLET | Freq: Every evening | ORAL | Status: DC | PRN

## 2014-04-23 NOTE — Progress Notes (Addendum)
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 Aydelotte is a very pleasant 48 year old white female here today for Cycle 2 Day 1 of Herceptin/Perjeta. She still working is a Marine scientist over at [employer] She stated that she has FMLA and has been able to use that when she doesn't feel well enough to work. She has been getting routine mammograms.  She began having some pain in the right axilla. She has had problems in the past with metastases.  She underwent an ultrasound. This did show some enlarged right axillary lymph nodes. There also was a mass in the right breast. This was noted at 1.2 cm in size.  She underwent a biopsy on May 19. The pathology report ([id number]) showed an invasive ductal carcinoma. There is lympho-vascular invasion.  She had a prognostic markers. The ER is (+), PR is (+) and she is HER-2 (+). As such, she has a triple positive breast cancer.  She has had studies done for staging. Also, she had a PET scan done. The PET scan does show metastatic disease to the bone and liver. She has a couple of liver lesions. These are small. She has a hypermetabolic lesion in the left iliac region.  She did have a MRI of the breasts bilaterally. The MRI of the breast did show a dominant poker-like mass in the right breast. It measured 1.8 x 2.1 x 2.7 cm. A second lesion is also noted measuring 1.1 x 1.1 x 1.5 cm. There was some enlarged hypervascular bilateral lymph nodes. The right axilla lymph node mass measured 3.5 x 2.4 cm.  She states that she is having trouble sleeping because of hot flashes and her energy level has decreased as a result. She states that it hurts her throat to drink carbonated beverages. She states that she  has had some mild diarrhea at times and uses Pepto bismol effectively to control it. She states that she had severe body aches "all over", a low grade fever that didn't go above 99.52F, and chills a few days after her last cycle of treatment. She states that taking her oxycodone as prescribed did not help the pain. She is also inquiring about having genetic testing done. She denies headaches, blurred vision, SOB, chest pain, palpitations, constipation, abdominal pain, problems urinating, blood in urine or stool, numbness, tingleing or swelling in her extremities. She denies having any pain at this time. She has a decent appetite. She's had chronic back pain from her work.  She is going through the change of life right now. She states that she has not had a cycle or spotting since the end of May. She has never been pregnant. She started her monthly cycles when she was 48 years old.  There is no history of breast cancer on her mother's side of the family.  CURRENT TREATMENT: Aromasin 51m daily. Herceptin/Perjeta with Zometa every 21 days  REVIEW OF SYSTEMS: All other 10 point review of systems is negative except for those things mentioned above.  PAST MEDICAL HISTORY: Past Medical History  Diagnosis Date  . Allergy   . Arthritis   . Asthma   . Coronary artery spasm   . PONV (postoperative nausea and vomiting)   . Family history of anesthesia complication  Mother and sisters has angioedema  . Sleep apnea     Does not use machine patient stated "its not bad enough for that"  . Pneumonia     hx of  . UTI (lower urinary tract infection)     Due to small ureters  . Cancer     right breast & lymph nodes  . Hypertension     pt reports that while taking medication for coronary spasms caused elevated blood pressure. Pt no longer takes meds for coronary spasms or HTN.   PAST SURGICAL HISTORY: Past Surgical History  Procedure Laterality Date  . Cholecystectomy    . Knee surgery      right  .  Tendon repair      finger  . Tonsillectomy    . Sinus surgery with instatrak      without instatrak  . Tubal ligation    . Colonoscopy w/ polypectomy    . Wisdom tooth extraction    . Cardiac catheterization  X 3    no PCI  . Portacath placement Left 03/26/2014    Procedure: INSERTION PORT-A-CATH;  Surgeon: Rolm Bookbinder, MD;  Location: [facility] OR;  Service: General;  Laterality: Left;   FAMILY HISTORY Family History  Problem Relation Age of Onset  . Coronary artery disease Mother   . Diabetes Mother   . Hypertension Mother   . Coronary artery disease Father   . Hypertension Father   . Hypertension Sister   . Coronary artery disease Maternal Grandmother   . Stroke Maternal Grandmother   . Depression Maternal Grandmother   . Cancer Maternal Grandmother     colon  . Coronary artery disease Maternal Grandfather   . Stroke Maternal Grandfather   . Depression Maternal Grandfather   . Coronary artery disease Paternal Grandmother   . Cancer Paternal Grandmother   . Cancer Paternal Grandfather     lung  . Hypertension Sister   . Cancer Paternal Aunt     breast   GYNECOLOGIC HISTORY:  Patient's last menstrual period was 03/18/2014.   SOCIAL HISTORY:  History   Social History  . Marital Status: Legally Separated    Spouse Name: N/A    Number of Children: N/A  . Years of Education: N/A   Occupational History  . Not on file.   Social History Main Topics  . Smoking status: Former Smoker -- 0.50 packs/day for 24 years    Types: Cigarettes    Start date: 09/23/1984    Quit date: 10/22/2007  . Smokeless tobacco: Never Used     Comment: QUIT 7 YEARS AGO  . Alcohol Use: Yes     Comment: once a week  . Drug Use: No  . Sexual Activity: Yes    Birth Control/ Protection: Condom   Other Topics Concern  . Not on file   Social History Narrative  . No narrative on file   ADVANCED DIRECTIVES: <no information>  HEALTH MAINTENANCE: History  Substance Use Topics  . Smoking  status: Former Smoker -- 0.50 packs/day for 24 years    Types: Cigarettes    Start date: 09/23/1984    Quit date: 10/22/2007  . Smokeless tobacco: Never Used     Comment: QUIT 7 YEARS AGO  . Alcohol Use: Yes     Comment: once a week   Colonoscopy: PAP: Bone density: Lipid panel:  Allergies  Allergen Reactions  . Iohexol      Code: RASH, Desc: VERY STRONG FAMILY HX OF ANGIOEDEMA WHEN  RECEIVING IV CONTRAST; PT HAS BEEN PREMEDICATED FOR OTHER CONTRASTED STUDIES(IN CATH. LAB)  KR, Onset Date: [id number]   . Prednisone Itching    Capillary beds bust  . Tetanus Toxoids Other (See Comments)    Ran a high fever for 48 hours  . Theophyllines Hives    Mental changes  . Versed [Midazolam] Other (See Comments)    Pt becomes violent   Current Outpatient Prescriptions  Medication Sig Dispense Refill  . albuterol (PROVENTIL HFA;VENTOLIN HFA) 108 (90 BASE) MCG/ACT inhaler Inhale 2 puffs into the lungs every 4 (four) hours as needed for wheezing or shortness of breath.      Marland Kitchen aspirin (ASPIRIN EC) 81 MG EC tablet Take 81 mg by mouth daily. Swallow whole.      . Bismuth Subsalicylate (PEPTO-BISMOL PO) Take by mouth as needed.      . calcium carbonate (TUMS - DOSED IN MG ELEMENTAL CALCIUM) 500 MG chewable tablet Chew 2 tablets by mouth 2 (two) times daily.      . diphenhydrAMINE (SOMINEX) 25 MG tablet Take 100 mg by mouth at bedtime as needed for sleep. Pt states she is taking up to 200 mg Benadryl at night and still not sleeping      . doxycycline (VIBRAMYCIN) 100 MG capsule Take 100 mg by mouth 2 (two) times daily.      Marland Kitchen exemestane (AROMASIN) 25 MG tablet Take 1 tablet (25 mg total) by mouth daily after breakfast.  30 tablet  6  . lidocaine-prilocaine (EMLA) cream Apply 1 application topically as needed. Placeonto port site 55mn prior to treatment and cover with plastic wrap.  30 g  2  . loratadine-pseudoephedrine (CLARITIN-D 24-HOUR) 10-240 MG per 24 hr tablet Take 1 tablet by mouth daily.       .Marland KitchenLORazepam (ATIVAN) 0.5 MG tablet Take 1 tablet (0.5 mg total) by mouth every 8 (eight) hours.  30 tablet  0  . Multiple Vitamin (MULTIVITAMIN) tablet Take 1 tablet by mouth daily.      . naproxen sodium (ANAPROX) 220 MG tablet Take 220 mg by mouth 2 (two) times daily with a meal.      . oxyCODONE (OXY IR/ROXICODONE) 5 MG immediate release tablet Take 1-2 tablets (5-10 mg total) by mouth every 6 (six) hours as needed.  30 tablet  0  . oxyCODONE 10 MG TABS Take 0.5-1 tablets (5-10 mg total) by mouth every 4 (four) hours as needed for severe pain.  90 tablet  0  . prochlorperazine (COMPAZINE) 10 MG tablet Take 1 tablet (10 mg total) by mouth every 6 (six) hours as needed for nausea or vomiting.  60 tablet  2   No current facility-administered medications for this visit.   Facility-Administered Medications Ordered in Other Visits  Medication Dose Route Frequency Provider Last Rate Last Dose  . 0.9 %  sodium chloride infusion   Intravenous Once PVolanda Napoleon MD      . 0.9 %  sodium chloride infusion   Intravenous Once PVolanda Napoleon MD      . acetaminophen (TYLENOL) tablet 650 mg  650 mg Oral Once PVolanda Napoleon MD      . alteplase (CATHFLO ACTIVASE) injection 2 mg  2 mg Intracatheter Once PRN PVolanda Napoleon MD      . diphenhydrAMINE (BENADRYL) capsule 50 mg  50 mg Oral Once PVolanda Napoleon MD      . heparin lock flush 100 unit/mL  500 Units Intracatheter Once PRN PCollier Salina  Oletha Cruel, MD      . heparin lock flush 100 unit/mL  500 Units Intracatheter Once PRN [name], MD      . heparin lock flush 100 unit/mL  250 Units Intracatheter Once PRN [name], MD      . pertuzumab (PERJETA) 420 mg in sodium chloride 0.9 % 250 mL chemo infusion  420 mg Intravenous Once [name], MD      . sodium chloride 0.9 % injection 10 mL  10 mL Intracatheter PRN [name], MD      . sodium chloride 0.9 % injection 10 mL  10 mL Intracatheter PRN [name], MD      . sodium  chloride 0.9 % injection 3 mL  3 mL Intravenous Once PRN [name], MD      . trastuzumab (HERCEPTIN) 588 mg in sodium chloride 0.9 % 250 mL chemo infusion  6 mg/kg (Treatment Plan Actual) Intravenous Once [name], MD      . Zoledronic Acid (ZOMETA) 4 mg IVPB  4 mg Intravenous Once [name], MD       OBJECTIVE: Filed Vitals:   04/23/14 1134  BP: 125/73  Pulse: 84  Temp: 97.9 F (36.6 C)  Resp: 14   Body mass index is 33.75 kg/(m^2). ECOG FS:0 - Asymptomatic Ocular: Sclerae unicteric, pupils equal, round and reactive to light Ear-nose-throat: Oropharynx clear, dentition fair Lymphatic: No cervical or supraclavicular adenopathy Lungs no rales or rhonchi, good excursion bilaterally Heart regular rate and rhythm, no murmur appreciated Abd soft, nontender, positive bowel sounds MSK no focal spinal tenderness, no joint edema Neuro: non-focal, well-oriented, appropriate affect Breasts: Deferred  LAB RESULTS: CMP     Component Value Date/Time   NA 139 04/23/2014 1059   NA 136 03/24/2014 1147   K 3.4 04/23/2014 1059   K 4.2 03/24/2014 1147   CL 102 04/23/2014 1059   CL 105 03/24/2014 1147   CO2 26 04/23/2014 1059   CO2 21 03/24/2014 1147   GLUCOSE 111 04/23/2014 1059   GLUCOSE 86 03/24/2014 1147   BUN 13 04/23/2014 1059   BUN 15 03/24/2014 1147   CREATININE 0.8 04/23/2014 1059   CREATININE 0.83 03/24/2014 1147   CALCIUM 9.1 04/23/2014 1059   CALCIUM 8.8 03/24/2014 1147   PROT 7.5 04/23/2014 1059   PROT 7.3 03/24/2014 1147   ALBUMIN 4.3 03/24/2014 1147   AST 26 04/23/2014 1059   AST 19 03/24/2014 1147   ALT 37 04/23/2014 1059   ALT 28 03/24/2014 1147   ALKPHOS 119* 04/23/2014 1059   ALKPHOS 106 03/24/2014 1147   BILITOT 0.50 04/23/2014 1059   BILITOT 0.5 03/24/2014 1147   GFRNONAA >60 03/07/2011 1320   GFRAA  Value: >60        The eGFR has been calculated using the MDRD equation. This calculation has not been validated in all clinical situations. eGFR's persistently <60 mL/min signify possible Chronic  Kidney Disease. 03/07/2011 1320   No results found for this basename: SPEP, UPEP,  kappa and lambda light chains   Lab Results  Component Value Date   WBC 7.3 04/23/2014   NEUTROABS 4.5 04/23/2014   HGB 15.4 04/23/2014   HCT 44.7 04/23/2014   MCV 90 04/23/2014   PLT 273 04/23/2014    Lab Results  Component Value Date   LABCA2 32 03/24/2014   No components found with this basename: [id number]   No results found for this basename: INR,  in the last 168 hours  STUDIES: Nm Cardiac Muga Rest  04/02/2014   CLINICAL DATA:  Breast cancer.  PreHerceptin therapy.  EXAM: NUCLEAR MEDICINE CARDIAC BLOOD POOL IMAGING (MUGA)  TECHNIQUE: Cardiac multi-gated acquisition was performed at rest following intravenous injection of Tc-61mlabeled red blood cells.  RADIOPHARMACEUTICALS:  25 mCi [id number]-vitro labeled red blood cells.  COMPARISON:  None  FINDINGS: The calculated left ventricular ejection fraction is equal to 58%. Normal left ventricular wall motion.  IMPRESSION: 1. Normal left ventricular systolic function. 2. The left ventricular ejection fraction equals 58%.   Electronically Signed   By: TaKerby Moors.D.   On: 04/02/2014 08:33   Chest Port 1 View  03/26/2014   CLINICAL DATA:  Postop evaluation  EXAM: PORTABLE CHEST - 1 VIEW  COMPARISON:  Two view chest dated 10/31/2013.  FINDINGS: There are low lung volumes and elevation of the right hemidiaphragm. Linear areas of density within the lung bases. A left porta catheter is appreciated tip projecting in the region the superior vena cava. There is no pneumothorax. No focal regions of consolidation or focal infiltrates. No acute osseous abnormalities.  IMPRESSION: Areas of atelectasis within the lung bases.  Elevated right hemidiaphragm  Otherwise no further focal acute abnormalities.   Electronically Signed   By: HeMargaree Mackintosh.D.   On: 03/26/2014 08:57   Dg Fluoro Guide Cv Line-no Report  03/26/2014   CLINICAL DATA: Insertion portacath   FLOURO GUIDE CV  LINE  Fluoroscopy was utilized by the requesting physician.  No radiographic  interpretation.    ASSESSMENT/PLAN: Ms. JeWardrops a pleasant [age]ear old perimenopausal white female. She has metastatic triple positive breast cancer. This all started in the right breast. She has obvious right axillary lymphadenopathy. The breast primary probably is in the right quadrant of the right breast.  She does not have a lot of systemic disease burden. As such, I really think we can treat her with anti-estrogens therapy. She also will need anti-HER2 therapy.   She is taking vitamin D 2,000 units along with one tums daily.   She was advised to take Megace 2012maily to help with hot flashes.  [facility]  We discussed her CBC and CMP in detail today.  Today we will proceed with Cycle 2 Day 1 of Herceptin/Perjeta as planned. She will also receive Zometa infused over 45 minutes.   She received Lupron on 03/24/14 and has not had a cycle or spotting since her last cycle in May/2015.  Her MUGA scan done on 03/24/14 showed her to have an EF of 58%.   She knows that she cannot be cured but that we can give her a very good and very long quality of life.   The plan was discussed with Dr. EnnMarin Olphe patient and her mother and they are all in agreement.   She knows to call here with any new questions or concerns and to go to the ED with in the event of an emergency. We can certainly see her sooner if need be.        CINEliezer BottomP 04/23/2014 1:31 PM  ADDENDUM:  I spoke to Ms. JenLobb think that she probably had a reaction to the Zometa. I think that if we just slow down the infusion rate, this should be tolerable. If not, then I think the next option would be Xgeva. Again, soundly with a fever, and diffuse arthralgias that the Zometa was  the issue.  We have made her menopausal. She received Lupron about one month ago.  We will do the genetic testing. I  think this is definitely reasonable.  It is still too soon to do any restaging on her. I would like to do 3 cycles or maybe even 4 and then reevaluate her.  I don't see anything on her physical exam that looks suspicious for any type of progressive tumor. Hopefully, we will find that the primary in the right breast will be improving.  I spent about 20 minutes with her and talk with her about the symptoms that she's been having. Hopefully, Megace will help with the hot flashes.  We will plan to get her back in about one month.   Lum Keas

## 2014-04-23 NOTE — Addendum Note (Signed)
Addended by: Burney Gauze R on: 04/23/2014 03:50 PM   Modules accepted: Orders

## 2014-04-23 NOTE — Patient Instructions (Signed)
[place] Discharge Instructions for Patients Receiving Chemotherapy  Today you received the following chemotherapy agents Herceptin, Perjeta  To help prevent nausea and vomiting after your treatment, we encourage you to take your nausea medication as ordered   If you develop nausea and vomiting that is not controlled by your nausea medication, call the clinic.   BELOW ARE SYMPTOMS THAT SHOULD BE REPORTED IMMEDIATELY:  *FEVER GREATER THAN 100.5 F  *CHILLS WITH OR WITHOUT FEVER  NAUSEA AND VOMITING THAT IS NOT CONTROLLED WITH YOUR NAUSEA MEDICATION  *UNUSUAL SHORTNESS OF BREATH  *UNUSUAL BRUISING OR BLEEDING  TENDERNESS IN MOUTH AND THROAT WITH OR WITHOUT PRESENCE OF ULCERS  *URINARY PROBLEMS  *BOWEL PROBLEMS  UNUSUAL RASH Items with * indicate a potential emergency and should be followed up as soon as possible.  Feel free to call the clinic you have any questions or concerns. The clinic phone number is [phone].

## 2014-05-13 ENCOUNTER — Other Ambulatory Visit: Payer: Self-pay | Admitting: *Deleted

## 2014-05-13 DIAGNOSIS — C50919 Malignant neoplasm of unspecified site of unspecified female breast: Secondary | ICD-10-CM

## 2014-05-14 ENCOUNTER — Other Ambulatory Visit (HOSPITAL_BASED_OUTPATIENT_CLINIC_OR_DEPARTMENT_OTHER): Payer: 59 | Admitting: Lab

## 2014-05-14 ENCOUNTER — Ambulatory Visit (HOSPITAL_BASED_OUTPATIENT_CLINIC_OR_DEPARTMENT_OTHER): Payer: 59 | Admitting: Hematology & Oncology

## 2014-05-14 ENCOUNTER — Ambulatory Visit (HOSPITAL_BASED_OUTPATIENT_CLINIC_OR_DEPARTMENT_OTHER): Payer: 59

## 2014-05-14 ENCOUNTER — Encounter: Payer: Self-pay | Admitting: Hematology & Oncology

## 2014-05-14 VITALS — BP 125/83 | HR 83 | Temp 98.1°F | Resp 16 | Ht 66.0 in | Wt 210.0 lb

## 2014-05-14 DIAGNOSIS — C50919 Malignant neoplasm of unspecified site of unspecified female breast: Secondary | ICD-10-CM

## 2014-05-14 DIAGNOSIS — C7951 Secondary malignant neoplasm of bone: Secondary | ICD-10-CM

## 2014-05-14 DIAGNOSIS — C50911 Malignant neoplasm of unspecified site of right female breast: Secondary | ICD-10-CM

## 2014-05-14 DIAGNOSIS — C7952 Secondary malignant neoplasm of bone marrow: Secondary | ICD-10-CM

## 2014-05-14 DIAGNOSIS — G4700 Insomnia, unspecified: Secondary | ICD-10-CM

## 2014-05-14 DIAGNOSIS — C773 Secondary and unspecified malignant neoplasm of axilla and upper limb lymph nodes: Secondary | ICD-10-CM

## 2014-05-14 DIAGNOSIS — Z170 Estrogen receptor positive status [ER+]: Secondary | ICD-10-CM

## 2014-05-14 DIAGNOSIS — Z5112 Encounter for antineoplastic immunotherapy: Secondary | ICD-10-CM

## 2014-05-14 DIAGNOSIS — J4530 Mild persistent asthma, uncomplicated: Secondary | ICD-10-CM

## 2014-05-14 LAB — CMP (CANCER CENTER ONLY)
ALBUMIN: 3.6 g/dL (ref 3.3–5.5)
ALT(SGPT): 41 U/L (ref 10–47)
AST: 26 U/L (ref 11–38)
Alkaline Phosphatase: 108 U/L — ABNORMAL HIGH (ref 26–84)
BUN, Bld: 13 mg/dL (ref 7–22)
CHLORIDE: 102 meq/L (ref 98–108)
CO2: 29 meq/L (ref 18–33)
Calcium: 9.5 mg/dL (ref 8.0–10.3)
Creat: 1.2 mg/dl (ref 0.6–1.2)
Glucose, Bld: 126 mg/dL — ABNORMAL HIGH (ref 73–118)
Potassium: 3.4 mEq/L (ref 3.3–4.7)
SODIUM: 139 meq/L (ref 128–145)
TOTAL PROTEIN: 7.5 g/dL (ref 6.4–8.1)
Total Bilirubin: 0.6 mg/dl (ref 0.20–1.60)

## 2014-05-14 LAB — CBC WITH DIFFERENTIAL (CANCER CENTER ONLY)
BASO#: 0 10*3/uL (ref 0.0–0.2)
BASO%: 0.5 % (ref 0.0–2.0)
EOS ABS: 0.3 10*3/uL (ref 0.0–0.5)
EOS%: 3.3 % (ref 0.0–7.0)
HCT: 46.4 % (ref 34.8–46.6)
HGB: 15.8 g/dL (ref 11.6–15.9)
LYMPH#: 2.7 10*3/uL (ref 0.9–3.3)
LYMPH%: 35 % (ref 14.0–48.0)
MCH: 30.6 pg (ref 26.0–34.0)
MCHC: 34.1 g/dL (ref 32.0–36.0)
MCV: 90 fL (ref 81–101)
MONO#: 0.5 10*3/uL (ref 0.1–0.9)
MONO%: 6 % (ref 0.0–13.0)
NEUT#: 4.3 10*3/uL (ref 1.5–6.5)
NEUT%: 55.2 % (ref 39.6–80.0)
PLATELETS: 293 10*3/uL (ref 145–400)
RBC: 5.17 10*6/uL (ref 3.70–5.32)
RDW: 13.1 % (ref 11.1–15.7)
WBC: 7.8 10*3/uL (ref 3.9–10.0)

## 2014-05-14 LAB — CANCER ANTIGEN 27.29: CA 27.29: 25 U/mL (ref 0–39)

## 2014-05-14 LAB — LACTATE DEHYDROGENASE: LDH: 144 U/L (ref 94–250)

## 2014-05-14 MED ORDER — TRAZODONE HCL 50 MG PO TABS
ORAL_TABLET | ORAL | Status: DC
Start: 2014-05-14 — End: 2014-10-08

## 2014-05-14 MED ORDER — SODIUM CHLORIDE 0.9 % IV SOLN
420.0000 mg | Freq: Once | INTRAVENOUS | Status: AC
  Administered 2014-05-14: 420 mg via INTRAVENOUS
  Filled 2014-05-14: qty 14

## 2014-05-14 MED ORDER — SODIUM CHLORIDE 0.9 % IJ SOLN
10.0000 mL | INTRAMUSCULAR | Status: DC | PRN
  Administered 2014-05-14: 10 mL
  Filled 2014-05-14: qty 10

## 2014-05-14 MED ORDER — ZOLEDRONIC ACID 4 MG/100ML IV SOLN
4.0000 mg | Freq: Once | INTRAVENOUS | Status: AC
  Administered 2014-05-14: 4 mg via INTRAVENOUS
  Filled 2014-05-14: qty 100

## 2014-05-14 MED ORDER — ACETAMINOPHEN 325 MG PO TABS
ORAL_TABLET | ORAL | Status: AC
  Filled 2014-05-14: qty 2

## 2014-05-14 MED ORDER — DIPHENHYDRAMINE HCL 25 MG PO CAPS
ORAL_CAPSULE | ORAL | Status: AC
  Filled 2014-05-14: qty 2

## 2014-05-14 MED ORDER — SODIUM CHLORIDE 0.9 % IV SOLN
6.0000 mg/kg | Freq: Once | INTRAVENOUS | Status: AC
  Administered 2014-05-14: 588 mg via INTRAVENOUS
  Filled 2014-05-14: qty 28

## 2014-05-14 MED ORDER — ALBUTEROL SULFATE HFA 108 (90 BASE) MCG/ACT IN AERS
2.0000 | INHALATION_SPRAY | RESPIRATORY_TRACT | Status: AC | PRN
Start: 2014-05-14 — End: ?

## 2014-05-14 MED ORDER — ACETAMINOPHEN 325 MG PO TABS
650.0000 mg | ORAL_TABLET | Freq: Once | ORAL | Status: AC
  Administered 2014-05-14: 650 mg via ORAL

## 2014-05-14 MED ORDER — DIPHENHYDRAMINE HCL 25 MG PO CAPS
50.0000 mg | ORAL_CAPSULE | Freq: Once | ORAL | Status: AC
  Administered 2014-05-14: 50 mg via ORAL

## 2014-05-14 MED ORDER — HEPARIN SOD (PORK) LOCK FLUSH 100 UNIT/ML IV SOLN
500.0000 [IU] | Freq: Once | INTRAVENOUS | Status: AC | PRN
  Administered 2014-05-14: 500 [IU]
  Filled 2014-05-14: qty 5

## 2014-05-14 MED ORDER — SODIUM CHLORIDE 0.9 % IV SOLN
Freq: Once | INTRAVENOUS | Status: AC
  Administered 2014-05-14: 13:00:00 via INTRAVENOUS

## 2014-05-14 MED ORDER — TRAMADOL HCL 50 MG PO TABS: ORAL_TABLET | ORAL | Status: DC

## 2014-05-14 NOTE — Progress Notes (Signed)
Hematology and Oncology Follow Up Visit  Bianca Kent [id number] 1966-02-19 48 y.o. 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 day  Zometa 4 mg IV Q3 weeks  Herceptin/Perjeta IV Q3 weeks     Interim History:  Ms.  Bianca Kent is back for followup. She is doing okay. She still feels quite tired. However, she still trying to work. She really wants to work. There she not sleeping well. We have tried different things for her. Nothing has worked so far. We will now try trazodone.  She has aches and pains. I don't know this might be from her being menopausal now. She is having a lot of hot flashes.  She's had no joint swelling. Patient still has the pain in the right axilla where she has lymphadenopathy.  She's had no cough. She's had no nausea vomiting. She's had no headache. She's had a some back discomfort. She is chronic lower back pain.  She's had no positive bowels or bladder. She has some loose stools.  Overall, her performance status is ECOG 1.  Medications: Current outpatient prescriptions:albuterol (PROVENTIL HFA;VENTOLIN HFA) 108 (90 BASE) MCG/ACT inhaler, Inhale 2 puffs into the lungs every 4 (four) hours as needed for wheezing or shortness of breath., Disp: 2 Inhaler, Rfl: 6;  ALPRAZolam (XANAX) 1 MG tablet, Take 1 tablet (1 mg total) by mouth at bedtime as needed for anxiety., Disp: 30 tablet, Rfl: 2;  aspirin (ASPIRIN EC) 81 MG EC tablet, Take 81 mg by mouth daily. Swallow whole., Disp: , Rfl:  Bismuth Subsalicylate (PEPTO-BISMOL PO), Take by mouth as needed., Disp: , Rfl: ;  calcium carbonate (TUMS - DOSED IN MG ELEMENTAL CALCIUM) 500 MG chewable tablet, Chew 2 tablets by mouth 2 (two) times daily., Disp: , Rfl: ;  Cholecalciferol (VITAMIN D) 2000 UNITS tablet, Take 2,000 Units by mouth daily., Disp: , Rfl:  diphenhydrAMINE (SOMINEX) 25 MG tablet, Take 100 mg by  mouth at bedtime as needed for sleep. Pt states she is taking up to 200 mg Benadryl at night and still not sleeping, Disp: , Rfl: ;  doxycycline (VIBRAMYCIN) 100 MG capsule, Take 100 mg by mouth 2 (two) times daily., Disp: , Rfl: ;  exemestane (AROMASIN) 25 MG tablet, Take 1 tablet (25 mg total) by mouth daily after breakfast., Disp: 30 tablet, Rfl: 6 lidocaine-prilocaine (EMLA) cream, Apply 1 application topically as needed. Placeonto port site 35min prior to treatment and cover with plastic wrap., Disp: 30 g, Rfl: 2;  loratadine-pseudoephedrine (CLARITIN-D 24-HOUR) 10-240 MG per 24 hr tablet, Take 1 tablet by mouth daily., Disp: , Rfl: ;  LORazepam (ATIVAN) 0.5 MG tablet, Take 1 tablet (0.5 mg total) by mouth every 8 (eight) hours., Disp: 30 tablet, Rfl: 0 Multiple Vitamin (MULTIVITAMIN) tablet, Take 1 tablet by mouth daily., Disp: , Rfl: ;  naproxen sodium (ANAPROX) 220 MG tablet, Take 220 mg by mouth 2 (two) times daily with a meal., Disp: , Rfl: ;  oxyCODONE 10 MG TABS, Take 0.5-1 tablets (5-10 mg total) by mouth every 4 (four) hours as needed for severe pain., Disp: 90 tablet, Rfl: 0 prochlorperazine (COMPAZINE) 10 MG tablet, Take 1 tablet (10 mg total) by mouth every 6 (six) hours as needed for nausea or vomiting., Disp: 60 tablet, Rfl: 2;  traZODone (DESYREL) 50 MG tablet, Take 1-2 if needed at bed for sleep., Disp: 60 tablet, Rfl: 3 No current  facility-administered medications for this visit. Facility-Administered Medications Ordered in Other Visits: heparin lock flush 100 unit/mL, 500 Units, Intracatheter, Once PRN, [name], MD;  pertuzumab (PERJETA) 420 mg in sodium chloride 0.9 % 250 mL chemo infusion, 420 mg, Intravenous, Once, [name], MD;  sodium chloride 0.9 % injection 10 mL, 10 mL, Intracatheter, PRN, [name], MD trastuzumab (HERCEPTIN) 588 mg in sodium chloride 0.9 % 250 mL chemo infusion, 6 mg/kg (Treatment Plan Actual), Intravenous, Once, [name], MD, Last  Rate: 556 mL/hr at 05/14/14 1351, 588 mg at 05/14/14 1351  Allergies:  Allergies  Allergen Reactions  . Iohexol      Code: RASH, Desc: VERY STRONG FAMILY HX OF ANGIOEDEMA WHEN RECEIVING IV CONTRAST; PT HAS BEEN PREMEDICATED FOR OTHER CONTRASTED STUDIES(IN CATH. LAB)  KR, Onset Date: [id number]   . Prednisone Itching    Capillary beds bust  . Tetanus Toxoids Other (See Comments)    Ran a high fever for 48 hours  . Theophyllines Hives    Mental changes  . Versed [Midazolam] Other (See Comments)    Pt becomes violent    Past Medical History, Surgical history, Social history, and Family History were reviewed and updated.  Review of Systems: As above  Physical Exam:  height is 5\' 6"  (1.676 m) and weight is 210 lb (95.255 kg). Her oral temperature is 98.1 F (36.7 C). Her blood pressure is 125/83 and her pulse is 83. Her respiration is 16.   Well-developed and well-nourished white female in no obvious distress. Head and neck exam shows no ocular or oral lesions. She has no thrush. She has some fullness and the right supraclavicular region. No adenopathy is noted in the left neck. Lungs are clear. Cardiac exam regular rhythm with no murmurs rubs or bruits. Axillary exam shows no left axillary adenopathy. She does have some slight fullness in the right axilla. Abdomen is soft. There is no palpable liver or spleen tip. Back exam shows no tenderness over the spine ribs or hips. Extremities shows no clubbing cyanosis or edema. Neurological exam shows no focal neurological deficits. Skin exam no rashes.  Lab Results  Component Value Date   WBC 7.8 05/14/2014   HGB 15.8 05/14/2014   HCT 46.4 05/14/2014   MCV 90 05/14/2014   PLT 293 05/14/2014     Chemistry      Component Value Date/Time   NA 139 05/14/2014 1150   NA 136 03/24/2014 1147   K 3.4 05/14/2014 1150   K 4.2 03/24/2014 1147   CL 102 05/14/2014 1150   CL 105 03/24/2014 1147   CO2 29 05/14/2014 1150   CO2 21 03/24/2014 1147   BUN 13 05/14/2014  1150   BUN 15 03/24/2014 1147   CREATININE 1.2 05/14/2014 1150   CREATININE 0.83 03/24/2014 1147      Component Value Date/Time   CALCIUM 9.5 05/14/2014 1150   CALCIUM 8.8 03/24/2014 1147   ALKPHOS 108* 05/14/2014 1150   ALKPHOS 106 03/24/2014 1147   AST 26 05/14/2014 1150   AST 19 03/24/2014 1147   ALT 41 05/14/2014 1150   ALT 28 03/24/2014 1147   BILITOT 0.60 05/14/2014 1150   BILITOT 0.5 03/24/2014 1147         Impression and Plan: Ms. Leaver is 48 year old white female with metastatic breast cancer. She is triple positive. She does not want chemotherapy if she can avoid it. Hopefully, we there are no so scans, we will find that she  is responding.  We will go ahead and plan for an MRI of the right breast in 2-3 weeks. I will also do a PET scan at the same time.  Hopefully, the trazodone will help her rest.  She wants to try some Ultram. She worked really wants to work. A, Ultram will help her work.  I'm just very impressed with how much she is trying to do. She really is trying help others.  We will see her back in 3 weeks. Hopefully, we will find that she is responding.  If not, then we can certainly try Faslodex. Again, there is always the chemotherapy option.   [name], MD 7/23/20151:56 PM

## 2014-05-14 NOTE — Patient Instructions (Addendum)
Pertuzumab injection What is this medicine? PERTUZUMAB (per TOOZ ue mab) is a monoclonal antibody that targets a protein called HER2. HER2 is found in some breast cancers. This medicine can stop cancer cell growth. This medicine is used with other cancer treatments. This medicine may be used for other purposes; ask your health care provider or pharmacist if you have questions. COMMON BRAND NAME(S): PERJETA What should I tell my health care provider before I take this medicine? They need to know if you have any of these conditions: -heart disease -heart failure -high blood pressure -history of irregular heart beat -recent or ongoing radiation therapy -an unusual or allergic reaction to pertuzumab, other medicines, foods, dyes, or preservatives -pregnant or trying to get pregnant -breast-feeding How should I use this medicine? This medicine is for infusion into a vein. It is given by a health care professional in a hospital or clinic setting. Talk to your pediatrician regarding the use of this medicine in children. Special care may be needed. Overdosage: If you think you've taken too much of this medicine contact a poison control center or emergency room at once. Overdosage: If you think you have taken too much of this medicine contact a poison control center or emergency room at once. NOTE: This medicine is only for you. Do not share this medicine with others. What if I miss a dose? It is important not to miss your dose. Call your doctor or health care professional if you are unable to keep an appointment. What may interact with this medicine? Interactions are not expected. Give your health care provider a list of all the medicines, herbs, non-prescription drugs, or dietary supplements you use. Also tell them if you smoke, drink alcohol, or use illegal drugs. Some items may interact with your medicine. This list may not describe all possible interactions. Give your health care provider a  list of all the medicines, herbs, non-prescription drugs, or dietary supplements you use. Also tell them if you smoke, drink alcohol, or use illegal drugs. Some items may interact with your medicine. What should I watch for while using this medicine? Your condition will be monitored carefully while you are receiving this medicine. Report any side effects. Continue your course of treatment even though you feel ill unless your doctor tells you to stop. Do not become pregnant while taking this medicine. Women should inform their doctor if they wish to become pregnant or think they might be pregnant. There is a potential for serious side effects to an unborn child. Talk to your health care professional or pharmacist for more information. Do not breast-feed an infant while taking this medicine. Call your doctor or health care professional for advice if you get a fever, chills or sore throat, or other symptoms of a cold or flu. Do not treat yourself. Try to avoid being around people who are sick. You may experience fever, chills, and headache during the infusion. Report any side effects during the infusion to your health care professional. What side effects may I notice from receiving this medicine? Side effects that you should report to your doctor or health care professional as soon as possible: -breathing problems -chest pain or palpitations -dizziness -feeling faint or lightheaded -fever or chills -skin rash, itching or hives -sore throat -swelling of the face, lips, or tongue -swelling of the legs or ankles -unusually weak or tired Side effects that usually do not require medical attention (Report these to your doctor or health care professional if they continue or  are bothersome.): -diarrhea -hair loss -nausea, vomiting -tiredness This list may not describe all possible side effects. Call your doctor for medical advice about side effects. You may report side effects to FDA at  1-800-FDA-1088. Where should I keep my medicine? This drug is given in a hospital or clinic and will not be stored at home. NOTE: This sheet is a summary. It may not cover all possible information. If you have questions about this medicine, talk to your doctor, pharmacist, or health care provider.  2015, [employer]vier/Gold Standard. (2012-08-07 16:54:15) Trastuzumab injection for infusion What is this medicine? TRASTUZUMAB (tras TOO zoo mab) is a monoclonal antibody. It targets a protein called HER2. This protein is found in some stomach and breast cancers. This medicine can stop cancer cell growth. This medicine may be used with other cancer treatments. This medicine may be used for other purposes; ask your health care provider or pharmacist if you have questions. COMMON BRAND NAME(S): Herceptin What should I tell my health care provider before I take this medicine? They need to know if you have any of these conditions: -heart disease -heart failure -infection (especially a virus infection such as chickenpox, cold sores, or herpes) -lung or breathing disease, like asthma -recent or ongoing radiation therapy -an unusual or allergic reaction to trastuzumab, benzyl alcohol, or other medications, foods, dyes, or preservatives -pregnant or trying to get pregnant -breast-feeding How should I use this medicine? This drug is given as an infusion into a vein. It is administered in a hospital or clinic by a specially trained health care professional. Talk to your pediatrician regarding the use of this medicine in children. This medicine is not approved for use in children. Overdosage: If you think you have taken too much of this medicine contact a poison control center or emergency room at once. NOTE: This medicine is only for you. Do not share this medicine with others. What if I miss a dose? It is important not to miss a dose. Call your doctor or health care professional if you are unable to keep an  appointment. What may interact with this medicine? -cyclophosphamide -doxorubicin -warfarin This list may not describe all possible interactions. Give your health care provider a list of all the medicines, herbs, non-prescription drugs, or dietary supplements you use. Also tell them if you smoke, drink alcohol, or use illegal drugs. Some items may interact with your medicine. What should I watch for while using this medicine? Visit your doctor for checks on your progress. Report any side effects. Continue your course of treatment even though you feel ill unless your doctor tells you to stop. Call your doctor or health care professional for advice if you get a fever, chills or sore throat, or other symptoms of a cold or flu. Do not treat yourself. Try to avoid being around people who are sick. You may experience fever, chills and shaking during your first infusion. These effects are usually mild and can be treated with other medicines. Report any side effects during the infusion to your health care professional. Fever and chills usually do not happen with later infusions. What side effects may I notice from receiving this medicine? Side effects that you should report to your doctor or other health care professional as soon as possible: -breathing difficulties -chest pain or palpitations -cough -dizziness or fainting -fever or chills, sore throat -skin rash, itching or hives -swelling of the legs or ankles -unusually weak or tired Side effects that usually do not require medical  attention (report to your doctor or other health care professional if they continue or are bothersome): -loss of appetite -headache -muscle aches -nausea This list may not describe all possible side effects. Call your doctor for medical advice about side effects. You may report side effects to FDA at 1-800-FDA-1088. Where should I keep my medicine? This drug is given in a hospital or clinic and will not be stored at  home. NOTE: This sheet is a summary. It may not cover all possible information. If you have questions about this medicine, talk to your doctor, pharmacist, or health care provider.  2015, Elsevier/Gold Standard. (2009-08-13 13:43:15) [facility] Discharge Instructions for Patients Receiving Chemotherapy  Today you received the following chemotherapy agents Perjeta and Herceptin.  To help prevent nausea and vomiting after your treatment, we encourage you to take your nausea medication.   If you develop nausea and vomiting that is not controlled by your nausea medication, call the clinic.   BELOW ARE SYMPTOMS THAT SHOULD BE REPORTED IMMEDIATELY:  *FEVER GREATER THAN 100.5 F  *CHILLS WITH OR WITHOUT FEVER  NAUSEA AND VOMITING THAT IS NOT CONTROLLED WITH YOUR NAUSEA MEDICATION  *UNUSUAL SHORTNESS OF BREATH  *UNUSUAL BRUISING OR BLEEDING  TENDERNESS IN MOUTH AND THROAT WITH OR WITHOUT PRESENCE OF ULCERS  *URINARY PROBLEMS  *BOWEL PROBLEMS  UNUSUAL RASH Items with * indicate a potential emergency and should be followed up as soon as possible.  Feel free to call the clinic you have any questions or concerns. The clinic phone number is [phone].

## 2014-06-02 ENCOUNTER — Ambulatory Visit (HOSPITAL_COMMUNITY)
Admission: RE | Admit: 2014-06-02 | Discharge: 2014-06-02 | Disposition: A | Discharge status: Home / Self Care | Payer: 59 | Source: Ambulatory Visit | Attending: Hematology & Oncology | Admitting: Hematology & Oncology

## 2014-06-02 ENCOUNTER — Encounter (HOSPITAL_COMMUNITY): Payer: Self-pay

## 2014-06-02 DIAGNOSIS — J45909 Unspecified asthma, uncomplicated: Secondary | ICD-10-CM | POA: Insufficient documentation

## 2014-06-02 DIAGNOSIS — C7952 Secondary malignant neoplasm of bone marrow: Secondary | ICD-10-CM

## 2014-06-02 DIAGNOSIS — C7951 Secondary malignant neoplasm of bone: Secondary | ICD-10-CM | POA: Diagnosis not present

## 2014-06-02 DIAGNOSIS — C773 Secondary and unspecified malignant neoplasm of axilla and upper limb lymph nodes: Secondary | ICD-10-CM | POA: Diagnosis not present

## 2014-06-02 DIAGNOSIS — C800 Disseminated malignant neoplasm, unspecified: Secondary | ICD-10-CM | POA: Insufficient documentation

## 2014-06-02 DIAGNOSIS — G4700 Insomnia, unspecified: Secondary | ICD-10-CM | POA: Diagnosis not present

## 2014-06-02 DIAGNOSIS — C7981 Secondary malignant neoplasm of breast: Secondary | ICD-10-CM | POA: Diagnosis not present

## 2014-06-02 DIAGNOSIS — C50919 Malignant neoplasm of unspecified site of unspecified female breast: Secondary | ICD-10-CM | POA: Diagnosis not present

## 2014-06-02 DIAGNOSIS — J4530 Mild persistent asthma, uncomplicated: Secondary | ICD-10-CM

## 2014-06-02 DIAGNOSIS — C787 Secondary malignant neoplasm of liver and intrahepatic bile duct: Secondary | ICD-10-CM | POA: Insufficient documentation

## 2014-06-02 LAB — GLUCOSE, CAPILLARY: GLUCOSE-CAPILLARY: 114 mg/dL — AB (ref 70–99)

## 2014-06-02 MED ORDER — FLUDEOXYGLUCOSE F - 18 (FDG) INJECTION: 10.4700 | Freq: Once | INTRAVENOUS | Status: AC | PRN

## 2014-06-03 ENCOUNTER — Telehealth: Payer: Self-pay | Admitting: *Deleted

## 2014-06-03 ENCOUNTER — Ambulatory Visit (HOSPITAL_COMMUNITY)
Admission: RE | Admit: 2014-06-03 | Discharge: 2014-06-03 | Disposition: A | Discharge status: Home / Self Care | Payer: 59 | Source: Ambulatory Visit | Attending: Hematology & Oncology | Admitting: Hematology & Oncology

## 2014-06-03 DIAGNOSIS — C800 Disseminated malignant neoplasm, unspecified: Secondary | ICD-10-CM | POA: Diagnosis not present

## 2014-06-03 DIAGNOSIS — C50919 Malignant neoplasm of unspecified site of unspecified female breast: Secondary | ICD-10-CM | POA: Diagnosis not present

## 2014-06-03 DIAGNOSIS — G4700 Insomnia, unspecified: Secondary | ICD-10-CM | POA: Insufficient documentation

## 2014-06-03 DIAGNOSIS — J45909 Unspecified asthma, uncomplicated: Secondary | ICD-10-CM | POA: Diagnosis not present

## 2014-06-03 DIAGNOSIS — J4530 Mild persistent asthma, uncomplicated: Secondary | ICD-10-CM

## 2014-06-03 MED ORDER — GADOBENATE DIMEGLUMINE 529 MG/ML IV SOLN
20.0000 mL | Freq: Once | INTRAVENOUS | Status: AC | PRN
  Administered 2014-06-03: 20 mL via INTRAVENOUS

## 2014-06-03 NOTE — Telephone Encounter (Addendum)
Message copied by Lenn Sink on Wed Jun 03, 2014  9:13 AM ------      Message from: [name]      Created: Tue Jun 02, 2014 10:09 PM       Call - cancer is better!!!  No obvious liver lesions seen!!!  Breast mass and lymph nodes are better!! pete ------Left voicemail informing pt that cancer is better!!! No obvious liver lesions seen!!! Breast mass and lymph nodes are better.

## 2014-06-04 ENCOUNTER — Encounter: Payer: Self-pay | Admitting: Hematology & Oncology

## 2014-06-04 ENCOUNTER — Ambulatory Visit (HOSPITAL_BASED_OUTPATIENT_CLINIC_OR_DEPARTMENT_OTHER): Payer: 59

## 2014-06-04 ENCOUNTER — Ambulatory Visit (HOSPITAL_BASED_OUTPATIENT_CLINIC_OR_DEPARTMENT_OTHER): Payer: 59 | Admitting: Hematology & Oncology

## 2014-06-04 ENCOUNTER — Other Ambulatory Visit (HOSPITAL_BASED_OUTPATIENT_CLINIC_OR_DEPARTMENT_OTHER): Payer: 59 | Admitting: Lab

## 2014-06-04 VITALS — BP 121/83 | HR 83 | Temp 97.2°F | Resp 16 | Ht 66.0 in | Wt 205.0 lb

## 2014-06-04 DIAGNOSIS — C787 Secondary malignant neoplasm of liver and intrahepatic bile duct: Secondary | ICD-10-CM

## 2014-06-04 DIAGNOSIS — G4700 Insomnia, unspecified: Secondary | ICD-10-CM

## 2014-06-04 DIAGNOSIS — C7952 Secondary malignant neoplasm of bone marrow: Secondary | ICD-10-CM

## 2014-06-04 DIAGNOSIS — C773 Secondary and unspecified malignant neoplasm of axilla and upper limb lymph nodes: Secondary | ICD-10-CM

## 2014-06-04 DIAGNOSIS — Z170 Estrogen receptor positive status [ER+]: Secondary | ICD-10-CM

## 2014-06-04 DIAGNOSIS — R110 Nausea: Secondary | ICD-10-CM

## 2014-06-04 DIAGNOSIS — C50919 Malignant neoplasm of unspecified site of unspecified female breast: Secondary | ICD-10-CM

## 2014-06-04 DIAGNOSIS — C7951 Secondary malignant neoplasm of bone: Secondary | ICD-10-CM

## 2014-06-04 DIAGNOSIS — Z5112 Encounter for antineoplastic immunotherapy: Secondary | ICD-10-CM

## 2014-06-04 DIAGNOSIS — J4530 Mild persistent asthma, uncomplicated: Secondary | ICD-10-CM

## 2014-06-04 LAB — CMP (CANCER CENTER ONLY)
ALBUMIN: 4.1 g/dL (ref 3.3–5.5)
ALT(SGPT): 37 U/L (ref 10–47)
AST: 30 U/L (ref 11–38)
Alkaline Phosphatase: 100 U/L — ABNORMAL HIGH (ref 26–84)
BUN: 12 mg/dL (ref 7–22)
CO2: 25 meq/L (ref 18–33)
Calcium: 8.7 mg/dL (ref 8.0–10.3)
Chloride: 98 mEq/L (ref 98–108)
Creat: 1.1 mg/dl (ref 0.6–1.2)
Glucose, Bld: 91 mg/dL (ref 73–118)
POTASSIUM: 3.7 meq/L (ref 3.3–4.7)
SODIUM: 140 meq/L (ref 128–145)
TOTAL PROTEIN: 8.2 g/dL — AB (ref 6.4–8.1)
Total Bilirubin: 0.6 mg/dl (ref 0.20–1.60)

## 2014-06-04 LAB — CBC WITH DIFFERENTIAL (CANCER CENTER ONLY)
BASO#: 0 10*3/uL (ref 0.0–0.2)
BASO%: 0.4 % (ref 0.0–2.0)
EOS%: 2.5 % (ref 0.0–7.0)
Eosinophils Absolute: 0.2 10*3/uL (ref 0.0–0.5)
HCT: 46.2 % (ref 34.8–46.6)
HEMOGLOBIN: 15.8 g/dL (ref 11.6–15.9)
LYMPH#: 2.2 10*3/uL (ref 0.9–3.3)
LYMPH%: 26 % (ref 14.0–48.0)
MCH: 30.6 pg (ref 26.0–34.0)
MCHC: 34.2 g/dL (ref 32.0–36.0)
MCV: 89 fL (ref 81–101)
MONO#: 0.6 10*3/uL (ref 0.1–0.9)
MONO%: 6.8 % (ref 0.0–13.0)
NEUT#: 5.5 10*3/uL (ref 1.5–6.5)
NEUT%: 64.3 % (ref 39.6–80.0)
Platelets: 301 10*3/uL (ref 145–400)
RBC: 5.17 10*6/uL (ref 3.70–5.32)
RDW: 12.9 % (ref 11.1–15.7)
WBC: 8.5 10*3/uL (ref 3.9–10.0)

## 2014-06-04 MED ORDER — ZOLEDRONIC ACID 4 MG/100ML IV SOLN
4.0000 mg | Freq: Once | INTRAVENOUS | Status: AC
  Administered 2014-06-04: 4 mg via INTRAVENOUS
  Filled 2014-06-04: qty 100

## 2014-06-04 MED ORDER — DIPHENHYDRAMINE HCL 25 MG PO CAPS
ORAL_CAPSULE | ORAL | Status: AC
  Filled 2014-06-04: qty 2

## 2014-06-04 MED ORDER — SODIUM CHLORIDE 0.9 % IJ SOLN
10.0000 mL | INTRAMUSCULAR | Status: DC | PRN
  Administered 2014-06-04: 10 mL
  Filled 2014-06-04: qty 10

## 2014-06-04 MED ORDER — PERTUZUMAB CHEMO INJECTION 420 MG/14ML
420.0000 mg | Freq: Once | INTRAVENOUS | Status: AC
  Administered 2014-06-04: 420 mg via INTRAVENOUS
  Filled 2014-06-04: qty 14

## 2014-06-04 MED ORDER — ONDANSETRON HCL 8 MG PO TABS: 8.0000 mg | ORAL_TABLET | Freq: Three times a day (TID) | ORAL | Status: DC | PRN

## 2014-06-04 MED ORDER — TRAMADOL HCL 50 MG PO TABS: ORAL_TABLET | ORAL | Status: DC

## 2014-06-04 MED ORDER — PANTOPRAZOLE SODIUM 40 MG PO TBEC: 40.0000 mg | DELAYED_RELEASE_TABLET | Freq: Every day | ORAL | Status: DC

## 2014-06-04 MED ORDER — HEPARIN SOD (PORK) LOCK FLUSH 100 UNIT/ML IV SOLN
500.0000 [IU] | Freq: Once | INTRAVENOUS | Status: AC | PRN
  Administered 2014-06-04: 500 [IU]
  Filled 2014-06-04: qty 5

## 2014-06-04 MED ORDER — ACETAMINOPHEN 325 MG PO TABS
650.0000 mg | ORAL_TABLET | Freq: Once | ORAL | Status: AC
  Administered 2014-06-04: 650 mg via ORAL

## 2014-06-04 MED ORDER — DIPHENHYDRAMINE HCL 25 MG PO CAPS
50.0000 mg | ORAL_CAPSULE | Freq: Once | ORAL | Status: AC
  Administered 2014-06-04: 50 mg via ORAL

## 2014-06-04 MED ORDER — SODIUM CHLORIDE 0.9 % IV SOLN
Freq: Once | INTRAVENOUS | Status: AC
  Administered 2014-06-04: 12:00:00 via INTRAVENOUS

## 2014-06-04 MED ORDER — PROCHLORPERAZINE MALEATE 10 MG PO TABS: 10.0000 mg | ORAL_TABLET | Freq: Four times a day (QID) | ORAL | Status: DC | PRN

## 2014-06-04 MED ORDER — ACETAMINOPHEN 325 MG PO TABS
ORAL_TABLET | ORAL | Status: AC
  Filled 2014-06-04: qty 2

## 2014-06-04 MED ORDER — TRASTUZUMAB CHEMO INJECTION 440 MG
6.0000 mg/kg | Freq: Once | INTRAVENOUS | Status: AC
  Administered 2014-06-04: 588 mg via INTRAVENOUS
  Filled 2014-06-04: qty 28

## 2014-06-04 MED ORDER — TRAMADOL HCL (ER BIPHASIC) 150 MG PO CP24: 150.0000 mg | ORAL_CAPSULE | Freq: Every day | ORAL | Status: DC

## 2014-06-04 NOTE — Patient Instructions (Signed)
Pertuzumab injection What is this medicine? PERTUZUMAB (per TOOZ ue mab) is a monoclonal antibody that targets a protein called HER2. HER2 is found in some breast cancers. This medicine can stop cancer cell growth. This medicine is used with other cancer treatments. This medicine may be used for other purposes; ask your health care provider or pharmacist if you have questions. COMMON BRAND NAME(S): PERJETA What should I tell my health care provider before I take this medicine? They need to know if you have any of these conditions: -heart disease -heart failure -high blood pressure -history of irregular heart beat -recent or ongoing radiation therapy -an unusual or allergic reaction to pertuzumab, other medicines, foods, dyes, or preservatives -pregnant or trying to get pregnant -breast-feeding How should I use this medicine? This medicine is for infusion into a vein. It is given by a health care professional in a hospital or clinic setting. Talk to your pediatrician regarding the use of this medicine in children. Special care may be needed. Overdosage: If you think you've taken too much of this medicine contact a poison control center or emergency room at once. Overdosage: If you think you have taken too much of this medicine contact a poison control center or emergency room at once. NOTE: This medicine is only for you. Do not share this medicine with others. What if I miss a dose? It is important not to miss your dose. Call your doctor or health care professional if you are unable to keep an appointment. What may interact with this medicine? Interactions are not expected. Give your health care provider a list of all the medicines, herbs, non-prescription drugs, or dietary supplements you use. Also tell them if you smoke, drink alcohol, or use illegal drugs. Some items may interact with your medicine. This list may not describe all possible interactions. Give your health care provider a  list of all the medicines, herbs, non-prescription drugs, or dietary supplements you use. Also tell them if you smoke, drink alcohol, or use illegal drugs. Some items may interact with your medicine. What should I watch for while using this medicine? Your condition will be monitored carefully while you are receiving this medicine. Report any side effects. Continue your course of treatment even though you feel ill unless your doctor tells you to stop. Do not become pregnant while taking this medicine. Women should inform their doctor if they wish to become pregnant or think they might be pregnant. There is a potential for serious side effects to an unborn child. Talk to your health care professional or pharmacist for more information. Do not breast-feed an infant while taking this medicine. Call your doctor or health care professional for advice if you get a fever, chills or sore throat, or other symptoms of a cold or flu. Do not treat yourself. Try to avoid being around people who are sick. You may experience fever, chills, and headache during the infusion. Report any side effects during the infusion to your health care professional. What side effects may I notice from receiving this medicine? Side effects that you should report to your doctor or health care professional as soon as possible: -breathing problems -chest pain or palpitations -dizziness -feeling faint or lightheaded -fever or chills -skin rash, itching or hives -sore throat -swelling of the face, lips, or tongue -swelling of the legs or ankles -unusually weak or tired Side effects that usually do not require medical attention (Report these to your doctor or health care professional if they continue or  are bothersome.): -diarrhea -hair loss -nausea, vomiting -tiredness This list may not describe all possible side effects. Call your doctor for medical advice about side effects. You may report side effects to FDA at  1-800-FDA-1088. Where should I keep my medicine? This drug is given in a hospital or clinic and will not be stored at home. NOTE: This sheet is a summary. It may not cover all possible information. If you have questions about this medicine, talk to your doctor, pharmacist, or health care provider.  2015, Elsevier/Gold Standard. (2012-08-07 16:54:15) Trastuzumab injection for infusion What is this medicine? TRASTUZUMAB (tras TOO zoo mab) is a monoclonal antibody. It targets a protein called HER2. This protein is found in some stomach and breast cancers. This medicine can stop cancer cell growth. This medicine may be used with other cancer treatments. This medicine may be used for other purposes; ask your health care provider or pharmacist if you have questions. COMMON BRAND NAME(S): Herceptin What should I tell my health care provider before I take this medicine? They need to know if you have any of these conditions: -heart disease -heart failure -infection (especially a virus infection such as chickenpox, cold sores, or herpes) -lung or breathing disease, like asthma -recent or ongoing radiation therapy -an unusual or allergic reaction to trastuzumab, benzyl alcohol, or other medications, foods, dyes, or preservatives -pregnant or trying to get pregnant -breast-feeding How should I use this medicine? This drug is given as an infusion into a vein. It is administered in a hospital or clinic by a specially trained health care professional. Talk to your pediatrician regarding the use of this medicine in children. This medicine is not approved for use in children. Overdosage: If you think you have taken too much of this medicine contact a poison control center or emergency room at once. NOTE: This medicine is only for you. Do not share this medicine with others. What if I miss a dose? It is important not to miss a dose. Call your doctor or health care professional if you are unable to keep an  appointment. What may interact with this medicine? -cyclophosphamide -doxorubicin -warfarin This list may not describe all possible interactions. Give your health care provider a list of all the medicines, herbs, non-prescription drugs, or dietary supplements you use. Also tell them if you smoke, drink alcohol, or use illegal drugs. Some items may interact with your medicine. What should I watch for while using this medicine? Visit your doctor for checks on your progress. Report any side effects. Continue your course of treatment even though you feel ill unless your doctor tells you to stop. Call your doctor or health care professional for advice if you get a fever, chills or sore throat, or other symptoms of a cold or flu. Do not treat yourself. Try to avoid being around people who are sick. You may experience fever, chills and shaking during your first infusion. These effects are usually mild and can be treated with other medicines. Report any side effects during the infusion to your health care professional. Fever and chills usually do not happen with later infusions. What side effects may I notice from receiving this medicine? Side effects that you should report to your doctor or other health care professional as soon as possible: -breathing difficulties -chest pain or palpitations -cough -dizziness or fainting -fever or chills, sore throat -skin rash, itching or hives -swelling of the legs or ankles -unusually weak or tired Side effects that usually do not require medical  attention (report to your doctor or other health care professional if they continue or are bothersome): -loss of appetite -headache -muscle aches -nausea This list may not describe all possible side effects. Call your doctor for medical advice about side effects. You may report side effects to FDA at 1-800-FDA-1088. Where should I keep my medicine? This drug is given in a hospital or clinic and will not be stored at  home. NOTE: This sheet is a summary. It may not cover all possible information. If you have questions about this medicine, talk to your doctor, pharmacist, or health care provider.  2015, Elsevier/Gold Standard. (2009-08-13 13:43:15) [facility] Discharge Instructions for Patients Receiving Chemotherapy  Today you received the following chemotherapy agents Perjeta and Herceptin.  To help prevent nausea and vomiting after your treatment, we encourage you to take your nausea medication.   If you develop nausea and vomiting that is not controlled by your nausea medication, call the clinic.   BELOW ARE SYMPTOMS THAT SHOULD BE REPORTED IMMEDIATELY:  *FEVER GREATER THAN 100.5 F  *CHILLS WITH OR WITHOUT FEVER  NAUSEA AND VOMITING THAT IS NOT CONTROLLED WITH YOUR NAUSEA MEDICATION  *UNUSUAL SHORTNESS OF BREATH  *UNUSUAL BRUISING OR BLEEDING  TENDERNESS IN MOUTH AND THROAT WITH OR WITHOUT PRESENCE OF ULCERS  *URINARY PROBLEMS  *BOWEL PROBLEMS  UNUSUAL RASH Items with * indicate a potential emergency and should be followed up as soon as possible.  Feel free to call the clinic you have any questions or concerns. The clinic phone number is [phone].   Zoledronic Acid injection (Hypercalcemia, Oncology) What is this medicine? ZOLEDRONIC ACID (ZOE le dron ik AS id) lowers the amount of calcium loss from bone. It is used to treat too much calcium in your blood from cancer. It is also used to prevent complications of cancer that has spread to the bone. This medicine may be used for other purposes; ask your health care provider or pharmacist if you have questions. COMMON BRAND NAME(S): Zometa What should I tell my health care provider before I take this medicine? They need to know if you have any of these conditions: -aspirin-sensitive asthma -cancer, especially if you are receiving medicines used to treat cancer -dental disease or wear dentures -infection -kidney  disease -receiving corticosteroids like dexamethasone or prednisone -an unusual or allergic reaction to zoledronic acid, other medicines, foods, dyes, or preservatives -pregnant or trying to get pregnant -breast-feeding How should I use this medicine? This medicine is for infusion into a vein. It is given by a health care professional in a hospital or clinic setting. Talk to your pediatrician regarding the use of this medicine in children. Special care may be needed. Overdosage: If you think you have taken too much of this medicine contact a poison control center or emergency room at once. NOTE: This medicine is only for you. Do not share this medicine with others. What if I miss a dose? It is important not to miss your dose. Call your doctor or health care professional if you are unable to keep an appointment. What may interact with this medicine? -certain antibiotics given by injection -NSAIDs, medicines for pain and inflammation, like ibuprofen or naproxen -some diuretics like bumetanide, furosemide -teriparatide -thalidomide This list may not describe all possible interactions. Give your health care provider a list of all the medicines, herbs, non-prescription drugs, or dietary supplements you use. Also tell them if you smoke, drink alcohol, or use illegal drugs. Some items may interact with  your medicine. What should I watch for while using this medicine? Visit your doctor or health care professional for regular checkups. It may be some time before you see the benefit from this medicine. Do not stop taking your medicine unless your doctor tells you to. Your doctor may order blood tests or other tests to see how you are doing. Women should inform their doctor if they wish to become pregnant or think they might be pregnant. There is a potential for serious side effects to an unborn child. Talk to your health care professional or pharmacist for more information. You should make sure that you  get enough calcium and vitamin D while you are taking this medicine. Discuss the foods you eat and the vitamins you take with your health care professional. Some people who take this medicine have severe bone, joint, and/or muscle pain. This medicine may also increase your risk for jaw problems or a broken thigh bone. Tell your doctor right away if you have severe pain in your jaw, bones, joints, or muscles. Tell your doctor if you have any pain that does not go away or that gets worse. Tell your dentist and dental surgeon that you are taking this medicine. You should not have major dental surgery while on this medicine. See your dentist to have a dental exam and fix any dental problems before starting this medicine. Take good care of your teeth while on this medicine. Make sure you see your dentist for regular follow-up appointments. What side effects may I notice from receiving this medicine? Side effects that you should report to your doctor or health care professional as soon as possible: -allergic reactions like skin rash, itching or hives, swelling of the face, lips, or tongue -anxiety, confusion, or depression -breathing problems -changes in vision -eye pain -feeling faint or lightheaded, falls -jaw pain, especially after dental work -mouth sores -muscle cramps, stiffness, or weakness -trouble passing urine or change in the amount of urine Side effects that usually do not require medical attention (report to your doctor or health care professional if they continue or are bothersome): -bone, joint, or muscle pain -constipation -diarrhea -fever -hair loss -irritation at site where injected -loss of appetite -nausea, vomiting -stomach upset -trouble sleeping -trouble swallowing -weak or tired This list may not describe all possible side effects. Call your doctor for medical advice about side effects. You may report side effects to FDA at 1-800-FDA-1088. Where should I keep my  medicine? This drug is given in a hospital or clinic and will not be stored at home. NOTE: This sheet is a summary. It may not cover all possible information. If you have questions about this medicine, talk to your doctor, pharmacist, or health care provider.  2015, Elsevier/Gold Standard. (2013-03-20 13:03:13)

## 2014-06-04 NOTE — Progress Notes (Signed)
Hematology and Oncology Follow Up Visit  Bianca Kent [id number] June 19, 1966 48 y.o. 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 day  Zometa 4 mg IV Q3 weeks  Herceptin/Perjeta IV Q3 weeks     Interim History:  [name] is back for a followup. We went ahead and repeat states her. We did a PET scan on her. Thank you, the PET scan did show a response. She had improvement in her breast, lymph nodes, liver, and bones.  She had a MRI of the right breast. This showed a nice decrease in the breast mass itself. In addition, there is also decreased in the right axillary lymphadenopathy.  She is feeling okay. She is having some problems with nausea. Much as to why she is having the nausea. This is intermittent. There is no defined time that this happens. She is in no diarrhea or constipation. There's no bleeding. She's had no fever. There is no headache.  She is on Ultram. She says that this does help with the pain in the right axilla. She takes 4 a day. I thought that we might consider in the time-released Ultram. I think this would make a lot of sense. We'll put her on 150 mg dose.  She has had no leg swelling. She's had no rashes. Throat is been no cough. She still spoke about trying to cut back. Her last MUGA scan was done back in June. I don't we will have to do another one until September.  Her last CA 27.29 in July was down to 25.  Overall, her performance status is good. 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  Medications: Current outpatient prescriptions:albuterol (PROVENTIL HFA;VENTOLIN HFA) 108 (90 BASE) MCG/ACT inhaler, Inhale 2 puffs into the lungs every 4 (four) hours as needed for wheezing or shortness of breath., Disp: 2 Inhaler, Rfl: 6;  ALPRAZolam (XANAX) 1 MG tablet, Take 1 tablet (1 mg total) by mouth at bedtime as needed for anxiety.,  Disp: 30 tablet, Rfl: 2;  aspirin (ASPIRIN EC) 81 MG EC tablet, Take 81 mg by mouth daily. Swallow whole., Disp: , Rfl:  Bismuth Subsalicylate (PEPTO-BISMOL PO), Take by mouth as needed., Disp: , Rfl: ;  calcium carbonate (TUMS - DOSED IN MG ELEMENTAL CALCIUM) 500 MG chewable tablet, Chew 2 tablets by mouth 2 (two) times daily., Disp: , Rfl: ;  Cholecalciferol (VITAMIN D) 2000 UNITS tablet, Take 2,000 Units by mouth daily., Disp: , Rfl:  diphenhydrAMINE (SOMINEX) 25 MG tablet, Take 100 mg by mouth at bedtime as needed for sleep. Pt states she is taking up to 200 mg Benadryl at night and still not sleeping, Disp: , Rfl: ;  exemestane (AROMASIN) 25 MG tablet, Take 1 tablet (25 mg total) by mouth daily after breakfast., Disp: 30 tablet, Rfl: 6 lidocaine-prilocaine (EMLA) cream, Apply 1 application topically as needed. Placeonto port site 91min prior to treatment and cover with plastic wrap., Disp: 30 g, Rfl: 2;  loratadine-pseudoephedrine (CLARITIN-D 24-HOUR) 10-240 MG per 24 hr tablet, Take 1 tablet by mouth daily., Disp: , Rfl: ;  LORazepam (ATIVAN) 0.5 MG tablet, Take 1 tablet (0.5 mg total) by mouth every 8 (eight) hours., Disp: 30 tablet, Rfl: 0 Multiple Vitamin (MULTIVITAMIN) tablet, Take 1 tablet by mouth daily., Disp: , Rfl: ;  naproxen sodium (ANAPROX) 220 MG tablet, Take 220 mg by mouth 2 (  two) times daily with a meal., Disp: , Rfl: ;  oxyCODONE 10 MG TABS, Take 0.5-1 tablets (5-10 mg total) by mouth every 4 (four) hours as needed for severe pain., Disp: 90 tablet, Rfl: 0 prochlorperazine (COMPAZINE) 10 MG tablet, Take 1 tablet (10 mg total) by mouth every 6 (six) hours as needed for nausea or vomiting., Disp: 60 tablet, Rfl: 2;  traMADol (ULTRAM) 50 MG tablet, Take 1-2 tablets, IF NEEDED, every 6 hrs for pain., Disp: 120 tablet, Rfl: 3;  traZODone (DESYREL) 50 MG tablet, Take 1-2 if needed at bed for sleep., Disp: 60 tablet, Rfl: 3 ondansetron (ZOFRAN) 8 MG tablet, Take 1 tablet (8 mg total) by mouth  every 8 (eight) hours as needed for nausea or vomiting., Disp: 20 tablet, Rfl: 2;  pantoprazole (PROTONIX) 40 MG tablet, Take 1 tablet (40 mg total) by mouth daily., Disp: 30 tablet, Rfl: 6;  TraMADol HCl 150 MG CP24, Take 150 mg by mouth daily., Disp: 30 capsule, Rfl: 3 No current facility-administered medications for this visit. Facility-Administered Medications Ordered in Other Visits: heparin lock flush 100 unit/mL, 500 Units, Intracatheter, Once PRN, Bianca Napoleon, MD;  pertuzumab (PERJETA) 420 mg in sodium chloride 0.9 % 250 mL chemo infusion, 420 mg, Intravenous, Once, Bianca Napoleon, MD;  sodium chloride 0.9 % injection 10 mL, 10 mL, Intracatheter, PRN, Bianca Napoleon, MD, 10 mL at 05/14/14 1524 sodium chloride 0.9 % injection 10 mL, 10 mL, Intracatheter, PRN, Bianca Napoleon, MD;  trastuzumab (HERCEPTIN) 588 mg in sodium chloride 0.9 % 250 mL chemo infusion, 6 mg/kg (Treatment Plan Actual), Intravenous, Once, Bianca Napoleon, MD, Last Rate: 556 mL/hr at 06/04/14 1311, 588 mg at 06/04/14 1311  Allergies:  Allergies  Allergen Reactions  . Iohexol      Code: RASH, Desc: VERY STRONG FAMILY HX OF ANGIOEDEMA WHEN RECEIVING IV CONTRAST; PT HAS BEEN PREMEDICATED FOR OTHER CONTRASTED STUDIES(IN CATH. LAB)  KR, Onset Date: [id number]   . Prednisone Itching    Capillary beds bust  . Tetanus Toxoids Other (See Comments)    Ran a high fever for 48 hours  . Theophyllines Hives    Mental changes  . Versed [Midazolam] Other (See Comments)    Pt becomes violent    Past Medical History, Surgical history, Social history, and Family History were reviewed and updated.  Review of Systems: As above  Physical Exam:  height is 5\' 6"  (1.676 m) and weight is 205 lb (92.987 kg). Her oral temperature is 97.2 F (36.2 C). Her blood pressure is 121/83 and her pulse is 83. Her respiration is 16.   Lebow and well-nourished white female. Head and neck exam shows no ocular or oral lesion. She has no adenopathy  in the left neck. Right neck with adenopathy is decreased. There is much fullness in the right subclavicular region. Frank palpable lymph nodes are not noted. She has no mucositis. Lungs are clear. Cardiac exam regular rhythm with a normal S1 and S2. There are no murmurs rubs or bruits. Abdomen is soft. She has good bowel sounds. There is no fluid wave. There is no palpable liver or spleen to. Right axilla shows some slight tenderness. There's not as much fullness in the right axilla. Extremities shows no clubbing cyanosis or edema. Skin exam no rashes ecchymosis or petechia. Neurological exam is nonfocal.  Lab Results  Component Value Date   WBC 8.5 06/04/2014   HGB 15.8 06/04/2014   HCT 46.2 06/04/2014   MCV 89  06/04/2014   PLT 301 06/04/2014     Chemistry      Component Value Date/Time   NA 140 06/04/2014 0929   NA 136 03/24/2014 1147   K 3.7 06/04/2014 0929   K 4.2 03/24/2014 1147   CL 98 06/04/2014 0929   CL 105 03/24/2014 1147   CO2 25 06/04/2014 0929   CO2 21 03/24/2014 1147   BUN 12 06/04/2014 0929   BUN 15 03/24/2014 1147   CREATININE 1.1 06/04/2014 0929   CREATININE 0.83 03/24/2014 1147      Component Value Date/Time   CALCIUM 8.7 06/04/2014 0929   CALCIUM 8.8 03/24/2014 1147   ALKPHOS 100* 06/04/2014 0929   ALKPHOS 106 03/24/2014 1147   AST 30 06/04/2014 0929   AST 19 03/24/2014 1147   ALT 37 06/04/2014 0929   ALT 28 03/24/2014 1147   BILITOT 0.60 06/04/2014 0929   BILITOT 0.5 03/24/2014 1147         Impression and Plan: Ms. Wisdom is 48 year old white female with metastatic breast cancer. Her cancer is triple positive. We have her on Herceptin and per [facility]. She also is on Aromasin. She is responding. As long as she is responding, I do not see a we have to make any changes. Phone she is progressing, that we my want to add Leslee Home which has been shown to help overcome insulin resistance.  We spent about 40 minutes with she and her sister. I answered all their questions.  We will continue on on  protocol until we see that it is now working with that she is not tolerated.  I'll plan to get her back in about 3 weeks' time.   Bianca Napoleon, MD 8/13/20151:40 PM

## 2014-06-05 LAB — LACTATE DEHYDROGENASE: LDH: 128 U/L (ref 94–250)

## 2014-06-05 LAB — CANCER ANTIGEN 27.29: CA 27.29: 35 U/mL (ref 0–39)

## 2014-06-05 NOTE — Progress Notes (Signed)
Received call from Manuela Schwartz at [facility] reporting they are unable to obtain Tramadol ER 150mg  at this time. Reports they do have 100mg  & 200mg  tablets available.   Per Dr [name], new script called for Tramadol ER 200mg  - 1 po daily, #30, 3 refills. dph

## 2014-06-25 ENCOUNTER — Other Ambulatory Visit (HOSPITAL_BASED_OUTPATIENT_CLINIC_OR_DEPARTMENT_OTHER): Payer: 59 | Admitting: Lab

## 2014-06-25 ENCOUNTER — Other Ambulatory Visit: Payer: Self-pay | Admitting: Family

## 2014-06-25 ENCOUNTER — Encounter: Payer: Self-pay | Admitting: Family

## 2014-06-25 ENCOUNTER — Ambulatory Visit (HOSPITAL_BASED_OUTPATIENT_CLINIC_OR_DEPARTMENT_OTHER): Payer: 59 | Admitting: Family

## 2014-06-25 ENCOUNTER — Ambulatory Visit (HOSPITAL_BASED_OUTPATIENT_CLINIC_OR_DEPARTMENT_OTHER): Payer: 59

## 2014-06-25 VITALS — BP 117/76 | HR 82 | Temp 97.8°F | Resp 16 | Ht 66.0 in | Wt 209.0 lb

## 2014-06-25 DIAGNOSIS — C7952 Secondary malignant neoplasm of bone marrow: Secondary | ICD-10-CM

## 2014-06-25 DIAGNOSIS — C50911 Malignant neoplasm of unspecified site of right female breast: Secondary | ICD-10-CM

## 2014-06-25 DIAGNOSIS — C7951 Secondary malignant neoplasm of bone: Secondary | ICD-10-CM

## 2014-06-25 DIAGNOSIS — C773 Secondary and unspecified malignant neoplasm of axilla and upper limb lymph nodes: Secondary | ICD-10-CM

## 2014-06-25 DIAGNOSIS — C50919 Malignant neoplasm of unspecified site of unspecified female breast: Secondary | ICD-10-CM

## 2014-06-25 DIAGNOSIS — Z170 Estrogen receptor positive status [ER+]: Secondary | ICD-10-CM

## 2014-06-25 DIAGNOSIS — Z5112 Encounter for antineoplastic immunotherapy: Secondary | ICD-10-CM

## 2014-06-25 LAB — CMP (CANCER CENTER ONLY)
ALBUMIN: 3.9 g/dL (ref 3.3–5.5)
ALT(SGPT): 36 U/L (ref 10–47)
AST: 21 U/L (ref 11–38)
Alkaline Phosphatase: 109 U/L — ABNORMAL HIGH (ref 26–84)
BUN, Bld: 9 mg/dL (ref 7–22)
CO2: 24 meq/L (ref 18–33)
Calcium: 8.4 mg/dL (ref 8.0–10.3)
Chloride: 103 mEq/L (ref 98–108)
Creat: 0.6 mg/dl (ref 0.6–1.2)
GLUCOSE: 119 mg/dL — AB (ref 73–118)
POTASSIUM: 3.6 meq/L (ref 3.3–4.7)
SODIUM: 139 meq/L (ref 128–145)
TOTAL PROTEIN: 7.9 g/dL (ref 6.4–8.1)
Total Bilirubin: 0.5 mg/dl (ref 0.20–1.60)

## 2014-06-25 LAB — CBC WITH DIFFERENTIAL (CANCER CENTER ONLY)
BASO#: 0.1 10*3/uL (ref 0.0–0.2)
BASO%: 0.7 % (ref 0.0–2.0)
EOS ABS: 0.2 10*3/uL (ref 0.0–0.5)
EOS%: 2.5 % (ref 0.0–7.0)
HCT: 46.3 % (ref 34.8–46.6)
HEMOGLOBIN: 15.9 g/dL (ref 11.6–15.9)
LYMPH#: 2.1 10*3/uL (ref 0.9–3.3)
LYMPH%: 27.5 % (ref 14.0–48.0)
MCH: 30.4 pg (ref 26.0–34.0)
MCHC: 34.3 g/dL (ref 32.0–36.0)
MCV: 89 fL (ref 81–101)
MONO#: 0.7 10*3/uL (ref 0.1–0.9)
MONO%: 8.6 % (ref 0.0–13.0)
NEUT%: 60.7 % (ref 39.6–80.0)
NEUTROS ABS: 4.6 10*3/uL (ref 1.5–6.5)
PLATELETS: 276 10*3/uL (ref 145–400)
RBC: 5.23 10*6/uL (ref 3.70–5.32)
RDW: 13.1 % (ref 11.1–15.7)
WBC: 7.6 10*3/uL (ref 3.9–10.0)

## 2014-06-25 LAB — CANCER ANTIGEN 27.29: CA 27.29: 32 U/mL (ref 0–39)

## 2014-06-25 MED ORDER — SODIUM CHLORIDE 0.9 % IV SOLN
Freq: Once | INTRAVENOUS | Status: AC
Start: 2014-06-25 — End: 2014-06-25
  Administered 2014-06-25: 11:00:00 via INTRAVENOUS

## 2014-06-25 MED ORDER — DIPHENHYDRAMINE HCL 25 MG PO CAPS
50.0000 mg | ORAL_CAPSULE | Freq: Once | ORAL | Status: AC
  Administered 2014-06-25: 50 mg via ORAL

## 2014-06-25 MED ORDER — DIPHENHYDRAMINE HCL 25 MG PO CAPS
ORAL_CAPSULE | ORAL | Status: AC
Start: 2014-06-25 — End: 2014-06-25
  Filled 2014-06-25: qty 2

## 2014-06-25 MED ORDER — ZOLEDRONIC ACID 4 MG/100ML IV SOLN
4.0000 mg | Freq: Once | INTRAVENOUS | Status: AC
Start: 2014-06-25 — End: 2014-06-25
  Administered 2014-06-25: 4 mg via INTRAVENOUS
  Filled 2014-06-25: qty 100

## 2014-06-25 MED ORDER — TRASTUZUMAB CHEMO INJECTION 440 MG
6.0000 mg/kg | Freq: Once | INTRAVENOUS | Status: AC
  Administered 2014-06-25: 588 mg via INTRAVENOUS
  Filled 2014-06-25: qty 28

## 2014-06-25 MED ORDER — ACETAMINOPHEN 325 MG PO TABS
ORAL_TABLET | ORAL | Status: AC
  Filled 2014-06-25: qty 2

## 2014-06-25 MED ORDER — SODIUM CHLORIDE 0.9 % IV SOLN
420.0000 mg | Freq: Once | INTRAVENOUS | Status: AC
  Administered 2014-06-25: 420 mg via INTRAVENOUS
  Filled 2014-06-25: qty 14

## 2014-06-25 MED ORDER — ACETAMINOPHEN 325 MG PO TABS
650.0000 mg | ORAL_TABLET | Freq: Once | ORAL | Status: AC
  Administered 2014-06-25: 650 mg via ORAL

## 2014-06-25 NOTE — Patient Instructions (Signed)
[place] Discharge Instructions for Patients Receiving Chemotherapy  Today you received the following chemotherapy agents Perjeta, Herceptin  To help prevent nausea and vomiting after your treatment, we encourage you to take your nausea medication as directed   If you develop nausea and vomiting that is not controlled by your nausea medication, call the clinic.   BELOW ARE SYMPTOMS THAT SHOULD BE REPORTED IMMEDIATELY:  *FEVER GREATER THAN 100.5 F  *CHILLS WITH OR WITHOUT FEVER  NAUSEA AND VOMITING THAT IS NOT CONTROLLED WITH YOUR NAUSEA MEDICATION  *UNUSUAL SHORTNESS OF BREATH  *UNUSUAL BRUISING OR BLEEDING  TENDERNESS IN MOUTH AND THROAT WITH OR WITHOUT PRESENCE OF ULCERS  *URINARY PROBLEMS  *BOWEL PROBLEMS  UNUSUAL RASH Items with * indicate a potential emergency and should be followed up as soon as possible.  Feel free to call the clinic you have any questions or concerns. The clinic phone number is [phone].

## 2014-06-25 NOTE — Progress Notes (Signed)
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 Bianca Kent is doing well. Bianca Kent does have some nausea after Bianca Kent chemo treatments that does cause vomiting. We talked about alternating Bianca Kent zofran and compazine and getting on a schedule the day of and for a few days after treatment to fend this off. Bianca Kent likes this idea and is going to try it. Bianca Kent has had PET, MRI and CT scans all of which show improvement. Bianca Kent has some soreness in Bianca Kent right neck and back from the lymphadenopathy. Bianca Kent denies fever, chills, cough, rash, headache, dizziness, chest pain, palpitations, abdominal pain, constipation, diarrhea, blood in urine or stool. Bianca Kent has had no numbness or tingling in Bianca Kent extremities. Bianca Kent does have some slight swelling in Bianca Kent right upper arm and I couls feel some lymphadenopathy in Bianca Kent right axillary and subclavicular nodes. This is unchanged from before. Bianca Kent has some SOB with exertion after chemo. Bianca Kent is eating well and doing Bianca Kent best to stay hydrated. Bianca Kent is on Ultram and says that this does help with the pain in the right axilla and shoulder. Bianca Kent last CA 27.29 in August was 35.   CURRENT TREATMENT: Aromasin 25 mg by mouth q. day  Zometa 4 mg IV Q3 weeks  Herceptin/Perjeta IV Q3 weeks  REVIEW OF SYSTEMS: All other 10 point review of systems is negative except for those issues mentioned above.   PAST MEDICAL HISTORY: Past Medical History  Diagnosis Date  . Allergy   . Arthritis   . Asthma   . Coronary artery spasm   . PONV (postoperative nausea and vomiting)   . Family history of anesthesia complication     Mother and sisters has angioedema  . Sleep apnea     Does not use  machine patient stated "its not bad enough for that"  . Pneumonia     hx of  . UTI (lower urinary tract infection)     Due to small ureters  . Cancer     right breast & lymph nodes  . Hypertension     pt reports that while taking medication for coronary spasms caused elevated blood pressure. Pt no longer takes meds for coronary spasms or HTN.   PAST SURGICAL HISTORY: Past Surgical History  Procedure Laterality Date  . Cholecystectomy    . Knee surgery      right  . Tendon repair      finger  . Tonsillectomy    . Sinus surgery with instatrak      without instatrak  . Tubal ligation    . Colonoscopy w/ polypectomy    . Wisdom tooth extraction    . Cardiac catheterization  X 3    no PCI  . Portacath placement Left 03/26/2014    Procedure: INSERTION PORT-A-CATH;  Surgeon: Rolm Bookbinder, MD;  Location: [facility] OR;  Service: General;  Laterality: Left;   FAMILY HISTORY Family History  Problem Relation Age of Onset  . Coronary artery disease Mother   . Diabetes Mother   . Hypertension Mother   . Coronary artery disease Father   . Hypertension Father   . Hypertension Sister   . Coronary artery disease Maternal Grandmother   . Stroke Maternal  Grandmother   . Depression Maternal Grandmother   . Cancer Maternal Grandmother     colon  . Coronary artery disease Maternal Grandfather   . Stroke Maternal Grandfather   . Depression Maternal Grandfather   . Coronary artery disease Paternal Grandmother   . Cancer Paternal Grandmother   . Cancer Paternal Grandfather     lung  . Hypertension Sister   . Cancer Paternal Aunt     breast    GYNECOLOGIC HISTORY:  Patient's last menstrual period was 04/11/2014.   SOCIAL HISTORY:  History   Social History  . Marital Status: Legally Separated    Spouse Name: N/A    Number of Children: N/A  . Years of Education: N/A   Occupational History  . Not on file.   Social History Main Topics  . Smoking status: Former Smoker -- 0.50  packs/day for 24 years    Types: Cigarettes    Start date: 09/23/1984    Quit date: 10/22/2007  . Smokeless tobacco: Never Used     Comment: QUIT 7 YEARS AGO  . Alcohol Use: Yes     Comment: once a week  . Drug Use: No  . Sexual Activity: Yes    Birth Control/ Protection: Condom   Other Topics Concern  . Not on file   Social History Narrative  . No narrative on file   ADVANCED DIRECTIVES: <no information>  HEALTH MAINTENANCE: History  Substance Use Topics  . Smoking status: Former Smoker -- 0.50 packs/day for 24 years    Types: Cigarettes    Start date: 09/23/1984    Quit date: 10/22/2007  . Smokeless tobacco: Never Used     Comment: QUIT 7 YEARS AGO  . Alcohol Use: Yes     Comment: once a week   Colonoscopy: PAP: Bone density: Lipid panel:  Allergies  Allergen Reactions  . Iohexol      Code: RASH, Desc: VERY STRONG FAMILY HX OF ANGIOEDEMA WHEN RECEIVING IV CONTRAST; PT HAS BEEN PREMEDICATED FOR OTHER CONTRASTED STUDIES(IN CATH. LAB)  KR, Onset Date: [id number]   . Prednisone Itching    Capillary beds bust  . Tetanus Toxoids Other (See Comments)    Ran a high fever for 48 hours  . Theophyllines Hives    Mental changes  . Versed [Midazolam] Other (See Comments)    Pt becomes violent   Current Outpatient Prescriptions  Medication Sig Dispense Refill  . albuterol (PROVENTIL HFA;VENTOLIN HFA) 108 (90 BASE) MCG/ACT inhaler Inhale 2 puffs into the lungs every 4 (four) hours as needed for wheezing or shortness of breath.  2 Inhaler  6  . ALPRAZolam (XANAX) 1 MG tablet Take 1 tablet (1 mg total) by mouth at bedtime as needed for anxiety.  30 tablet  2  . aspirin (ASPIRIN EC) 81 MG EC tablet Take 81 mg by mouth daily. Swallow whole.      . Bismuth Subsalicylate (PEPTO-BISMOL PO) Take by mouth as needed.      . calcium carbonate (TUMS - DOSED IN MG ELEMENTAL CALCIUM) 500 MG chewable tablet Chew 2 tablets by mouth 2 (two) times daily.      . Cholecalciferol (VITAMIN D)  2000 UNITS tablet Take 2,000 Units by mouth daily.      . diphenhydrAMINE (SOMINEX) 25 MG tablet Take 100 mg by mouth at bedtime as needed for sleep. Pt states Bianca Kent is taking up to 200 mg Benadryl at night and still not sleeping      . exemestane (AROMASIN)  25 MG tablet Take 1 tablet (25 mg total) by mouth daily after breakfast.  30 tablet  6  . lidocaine-prilocaine (EMLA) cream Apply 1 application topically as needed. Placeonto port site 73mn prior to treatment and cover with plastic wrap.  30 g  2  . loratadine-pseudoephedrine (CLARITIN-D 24-HOUR) 10-240 MG per 24 hr tablet Take 1 tablet by mouth daily.      .Marland KitchenLORazepam (ATIVAN) 0.5 MG tablet Take 1 tablet (0.5 mg total) by mouth every 8 (eight) hours.  30 tablet  0  . Multiple Vitamin (MULTIVITAMIN) tablet Take 1 tablet by mouth daily.      . naproxen sodium (ANAPROX) 220 MG tablet Take 220 mg by mouth 2 (two) times daily with a meal.      . ondansetron (ZOFRAN) 8 MG tablet Take 1 tablet (8 mg total) by mouth every 8 (eight) hours as needed for nausea or vomiting.  20 tablet  2  . oxyCODONE 10 MG TABS Take 0.5-1 tablets (5-10 mg total) by mouth every 4 (four) hours as needed for severe pain.  90 tablet  0  . pantoprazole (PROTONIX) 40 MG tablet Take 1 tablet (40 mg total) by mouth daily.  30 tablet  6  . prochlorperazine (COMPAZINE) 10 MG tablet Take 1 tablet (10 mg total) by mouth every 6 (six) hours as needed for nausea or vomiting.  60 tablet  2  . traMADol (ULTRAM) 50 MG tablet Take 1-2 tablets, IF NEEDED, every 6 hrs for pain.  120 tablet  3  . TraMADol HCl 150 MG CP24 Take 150 mg by mouth daily.  30 capsule  3  . traZODone (DESYREL) 50 MG tablet Take 1-2 if needed at bed for sleep.  60 tablet  3   No current facility-administered medications for this visit.   Facility-Administered Medications Ordered in Other Visits  Medication Dose Route Frequency Provider Last Rate Last Dose  . sodium chloride 0.9 % injection 10 mL  10 mL Intracatheter  PRN PVolanda Napoleon MD   10 mL at 05/14/14 1524   OBJECTIVE: Filed Vitals:   06/25/14 1040  BP: 117/76  Pulse: 82  Temp: 97.8 F (36.6 C)  Resp: 16   Body mass index is 33.75 kg/(m^2). ECOG FS:0 - Asymptomatic Ocular: Sclerae unicteric, pupils equal, round and reactive to light Ear-nose-throat: Oropharynx clear, dentition fair Lymphatic: No cervical or supraclavicular adenopathy Lungs no rales or rhonchi, good excursion bilaterally Heart regular rate and rhythm, no murmur appreciated Abd soft, nontender, positive bowel sounds MSK no focal spinal tenderness, no joint edema Neuro: non-focal, well-oriented, appropriate affect Breasts: Still has some axillary and subclavicular lymphadenopathy in the right side. Bianca Kent has no new lumps or bumps. No rash.   LAB RESULTS: CMP     Component Value Date/Time   NA 140 06/04/2014 0929   NA 136 03/24/2014 1147   K 3.7 06/04/2014 0929   K 4.2 03/24/2014 1147   CL 98 06/04/2014 0929   CL 105 03/24/2014 1147   CO2 25 06/04/2014 0929   CO2 21 03/24/2014 1147   GLUCOSE 91 06/04/2014 0929   GLUCOSE 86 03/24/2014 1147   BUN 12 06/04/2014 0929   BUN 15 03/24/2014 1147   CREATININE 1.1 06/04/2014 0929   CREATININE 0.83 03/24/2014 1147   CALCIUM 8.7 06/04/2014 0929   CALCIUM 8.8 03/24/2014 1147   PROT 8.2* 06/04/2014 0929   PROT 7.3 03/24/2014 1147   ALBUMIN 4.3 03/24/2014 1147   AST 30 06/04/2014 0929  AST 19 03/24/2014 1147   ALT 37 06/04/2014 0929   ALT 28 03/24/2014 1147   ALKPHOS 100* 06/04/2014 0929   ALKPHOS 106 03/24/2014 1147   BILITOT 0.60 06/04/2014 0929   BILITOT 0.5 03/24/2014 1147   GFRNONAA >60 03/07/2011 1320   GFRAA  Value: >60        The eGFR has been calculated using the MDRD equation. This calculation has not been validated in all clinical situations. eGFR's persistently <60 mL/min signify possible Chronic Kidney Disease. 03/07/2011 1320   No results found for this basename: SPEP, UPEP,  kappa and lambda light chains   Lab Results  Component Value Date    WBC 7.6 06/25/2014   NEUTROABS 4.6 06/25/2014   HGB 15.9 06/25/2014   HCT 46.3 06/25/2014   MCV 89 06/25/2014   PLT 276 06/25/2014   Lab Results  Component Value Date   LABCA2 35 06/04/2014   No components found with this basename: [id number]   No results found for this basename: INR,  in the last 168 hours  STUDIES: None  ASSESSMENT/PLAN: Bianca Kent is 48 year old white female with metastatic breast cancer - triple positive. Bianca Kent is on Herceptin/Perjeta/Aromasin and is responding nicely with this. We will proceed with Day 1 Cycle 5 today.   Bianca Kent scans have shown improvement of disease.  We will get a follow-up MUGA scan next week.  Bianca Kent CBC today was normal. We will wait and see what the rest of Bianca Kent scans show. Bianca Kent has Bianca Kent treatment schedule. All questions today were answered. Bianca Kent knows to call here with any other questions or concerns. We can certainly see Bianca Kent sooner if need be.  Eliezer Bottom, NP 06/25/2014 10:42 AM

## 2014-07-02 ENCOUNTER — Ambulatory Visit (HOSPITAL_COMMUNITY)
Admission: RE | Admit: 2014-07-02 | Discharge: 2014-07-02 | Disposition: A | Discharge status: Home / Self Care | Payer: 59 | Source: Ambulatory Visit | Attending: Family | Admitting: Family

## 2014-07-02 DIAGNOSIS — Z79899 Other long term (current) drug therapy: Secondary | ICD-10-CM | POA: Diagnosis not present

## 2014-07-02 DIAGNOSIS — C50919 Malignant neoplasm of unspecified site of unspecified female breast: Secondary | ICD-10-CM | POA: Insufficient documentation

## 2014-07-02 MED ORDER — TECHNETIUM TC 99M-LABELED RED BLOOD CELLS IV KIT
25.0000 | PACK | Freq: Once | INTRAVENOUS | Status: AC | PRN
  Administered 2014-07-02: 25 via INTRAVENOUS

## 2014-07-15 ENCOUNTER — Other Ambulatory Visit: Payer: Self-pay | Admitting: *Deleted

## 2014-07-15 DIAGNOSIS — C50919 Malignant neoplasm of unspecified site of unspecified female breast: Secondary | ICD-10-CM

## 2014-07-16 ENCOUNTER — Ambulatory Visit (HOSPITAL_BASED_OUTPATIENT_CLINIC_OR_DEPARTMENT_OTHER): Payer: 59 | Admitting: Hematology & Oncology

## 2014-07-16 ENCOUNTER — Encounter: Payer: Self-pay | Admitting: Hematology & Oncology

## 2014-07-16 ENCOUNTER — Ambulatory Visit (HOSPITAL_BASED_OUTPATIENT_CLINIC_OR_DEPARTMENT_OTHER): Payer: 59

## 2014-07-16 ENCOUNTER — Other Ambulatory Visit (HOSPITAL_BASED_OUTPATIENT_CLINIC_OR_DEPARTMENT_OTHER): Payer: 59 | Admitting: Lab

## 2014-07-16 VITALS — BP 122/80 | HR 88 | Temp 97.9°F | Resp 14 | Ht 66.0 in | Wt 205.0 lb

## 2014-07-16 DIAGNOSIS — C787 Secondary malignant neoplasm of liver and intrahepatic bile duct: Secondary | ICD-10-CM

## 2014-07-16 DIAGNOSIS — C7952 Secondary malignant neoplasm of bone marrow: Secondary | ICD-10-CM

## 2014-07-16 DIAGNOSIS — M79609 Pain in unspecified limb: Secondary | ICD-10-CM

## 2014-07-16 DIAGNOSIS — C50911 Malignant neoplasm of unspecified site of right female breast: Secondary | ICD-10-CM

## 2014-07-16 DIAGNOSIS — C7951 Secondary malignant neoplasm of bone: Secondary | ICD-10-CM

## 2014-07-16 DIAGNOSIS — Z170 Estrogen receptor positive status [ER+]: Secondary | ICD-10-CM

## 2014-07-16 DIAGNOSIS — C773 Secondary and unspecified malignant neoplasm of axilla and upper limb lymph nodes: Secondary | ICD-10-CM

## 2014-07-16 DIAGNOSIS — C50919 Malignant neoplasm of unspecified site of unspecified female breast: Secondary | ICD-10-CM

## 2014-07-16 DIAGNOSIS — Z5112 Encounter for antineoplastic immunotherapy: Secondary | ICD-10-CM

## 2014-07-16 LAB — CBC WITH DIFFERENTIAL (CANCER CENTER ONLY)
BASO#: 0 10*3/uL (ref 0.0–0.2)
BASO%: 0.3 % (ref 0.0–2.0)
EOS ABS: 0.1 10*3/uL (ref 0.0–0.5)
EOS%: 2.1 % (ref 0.0–7.0)
HEMATOCRIT: 46.9 % — AB (ref 34.8–46.6)
HEMOGLOBIN: 15.9 g/dL (ref 11.6–15.9)
LYMPH#: 1.7 10*3/uL (ref 0.9–3.3)
LYMPH%: 25.9 % (ref 14.0–48.0)
MCH: 30.2 pg (ref 26.0–34.0)
MCHC: 33.9 g/dL (ref 32.0–36.0)
MCV: 89 fL (ref 81–101)
MONO#: 0.6 10*3/uL (ref 0.1–0.9)
MONO%: 8.8 % (ref 0.0–13.0)
NEUT#: 4.2 10*3/uL (ref 1.5–6.5)
NEUT%: 62.9 % (ref 39.6–80.0)
Platelets: 252 10*3/uL (ref 145–400)
RBC: 5.26 10*6/uL (ref 3.70–5.32)
RDW: 13.1 % (ref 11.1–15.7)
WBC: 6.7 10*3/uL (ref 3.9–10.0)

## 2014-07-16 LAB — CMP (CANCER CENTER ONLY)
ALT: 62 U/L — AB (ref 10–47)
AST: 51 U/L — ABNORMAL HIGH (ref 11–38)
Albumin: 3.7 g/dL (ref 3.3–5.5)
Alkaline Phosphatase: 101 U/L — ABNORMAL HIGH (ref 26–84)
BILIRUBIN TOTAL: 0.5 mg/dL (ref 0.20–1.60)
BUN, Bld: 8 mg/dL (ref 7–22)
CALCIUM: 8.8 mg/dL (ref 8.0–10.3)
CHLORIDE: 98 meq/L (ref 98–108)
CO2: 25 meq/L (ref 18–33)
Creat: 0.8 mg/dl (ref 0.6–1.2)
GLUCOSE: 93 mg/dL (ref 73–118)
Potassium: 4 mEq/L (ref 3.3–4.7)
SODIUM: 134 meq/L (ref 128–145)
TOTAL PROTEIN: 7.3 g/dL (ref 6.4–8.1)

## 2014-07-16 MED ORDER — HYDROMORPHONE HCL 4 MG PO TABS: ORAL_TABLET | ORAL | Status: DC

## 2014-07-16 MED ORDER — TRASTUZUMAB CHEMO INJECTION 440 MG
6.0000 mg/kg | Freq: Once | INTRAVENOUS | Status: AC
  Administered 2014-07-16: 588 mg via INTRAVENOUS
  Filled 2014-07-16: qty 28

## 2014-07-16 MED ORDER — ACETAMINOPHEN 325 MG PO TABS
650.0000 mg | ORAL_TABLET | Freq: Once | ORAL | Status: AC
  Administered 2014-07-16: 650 mg via ORAL

## 2014-07-16 MED ORDER — HYDROMORPHONE HCL 1 MG/ML IJ SOLN
2.0000 mg | Freq: Once | INTRAMUSCULAR | Status: AC
  Administered 2014-07-16: 2 mg via INTRAVENOUS

## 2014-07-16 MED ORDER — SODIUM CHLORIDE 0.9 % IJ SOLN
10.0000 mL | INTRAMUSCULAR | Status: DC | PRN
  Administered 2014-07-16: 10 mL
  Filled 2014-07-16: qty 10

## 2014-07-16 MED ORDER — HYDROMORPHONE HCL 4 MG/ML IJ SOLN
INTRAMUSCULAR | Status: AC
  Filled 2014-07-16: qty 1

## 2014-07-16 MED ORDER — ZOLEDRONIC ACID 4 MG/100ML IV SOLN
4.0000 mg | Freq: Once | INTRAVENOUS | Status: AC
  Administered 2014-07-16: 4 mg via INTRAVENOUS
  Filled 2014-07-16: qty 100

## 2014-07-16 MED ORDER — FENTANYL 25 MCG/HR TD PT72: 25.0000 ug | MEDICATED_PATCH | TRANSDERMAL | Status: DC

## 2014-07-16 MED ORDER — DIPHENHYDRAMINE HCL 25 MG PO CAPS
50.0000 mg | ORAL_CAPSULE | Freq: Once | ORAL | Status: AC
  Administered 2014-07-16: 50 mg via ORAL

## 2014-07-16 MED ORDER — HEPARIN SOD (PORK) LOCK FLUSH 100 UNIT/ML IV SOLN
500.0000 [IU] | Freq: Once | INTRAVENOUS | Status: AC | PRN
  Administered 2014-07-16: 500 [IU]
  Filled 2014-07-16: qty 5

## 2014-07-16 MED ORDER — PROMETHAZINE HCL 25 MG RE SUPP: 25.0000 mg | Freq: Four times a day (QID) | RECTAL | Status: DC | PRN

## 2014-07-16 MED ORDER — SODIUM CHLORIDE 0.9 % IV SOLN
Freq: Once | INTRAVENOUS | Status: AC
  Administered 2014-07-16: 12:00:00 via INTRAVENOUS

## 2014-07-16 MED ORDER — DIPHENHYDRAMINE HCL 25 MG PO CAPS
ORAL_CAPSULE | ORAL | Status: AC
  Filled 2014-07-16: qty 2

## 2014-07-16 MED ORDER — SODIUM CHLORIDE 0.9 % IV SOLN
420.0000 mg | Freq: Once | INTRAVENOUS | Status: AC
  Administered 2014-07-16: 420 mg via INTRAVENOUS
  Filled 2014-07-16: qty 14

## 2014-07-16 MED ORDER — ACETAMINOPHEN 325 MG PO TABS
ORAL_TABLET | ORAL | Status: AC
  Filled 2014-07-16: qty 2

## 2014-07-16 NOTE — Progress Notes (Signed)
Hematology and Oncology Follow Up Visit  Bianca Kent [id number] 10/21/1966 48 y.o. 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 day  Zometa 4 mg IV Q3 weeks  Herceptin/Perjeta IV Q3 weeks     Interim History:  [name] is back for followup. She is having pain in the right upper arm. She has numbness in the right upper arm. I suspect that he probably has some brachial plexus involvement by lymphadenopathy. We'll have to get an MRI.  She says the pain medicine is not working. I did put her on something long-acting. Hopefully, a Duragesic patch will help. I will also try her on some oral Dilaudid. IV Dilaudid helps.  Her last PET scan showed that she was responding.  I would be that she might be some radiation therapy to the right supraclavicular region if we do find some involvement.  She is nauseated. I will put her on some Phenergan suppositories.  She's had no fever. She does feel tired. Bianca Kent has been okay. She's had no vomiting.  There's been no leg swelling. She's had no rashes. She's had no visual issues. She's had no mouth sores.  Overall, her performance status is ECOG 1. Medications: Current outpatient prescriptions:albuterol (PROVENTIL HFA;VENTOLIN HFA) 108 (90 BASE) MCG/ACT inhaler, Inhale 2 puffs into the lungs every 4 (four) hours as needed for wheezing or shortness of breath., Disp: 2 Inhaler, Rfl: 6;  ALPRAZolam (XANAX) 1 MG tablet, Take 1 tablet (1 mg total) by mouth at bedtime as needed for anxiety., Disp: 30 tablet, Rfl: 2;  aspirin (ASPIRIN EC) 81 MG EC tablet, Take 81 mg by mouth daily. Swallow whole., Disp: , Rfl:  Bismuth Subsalicylate (PEPTO-BISMOL PO), Take by mouth as needed., Disp: , Rfl: ;  calcium carbonate (TUMS - DOSED IN MG ELEMENTAL CALCIUM) 500 MG chewable tablet, Chew 2 tablets by mouth 2 (two) times daily., Disp: , Rfl: ;   Cholecalciferol (VITAMIN D) 2000 UNITS tablet, Take 2,000 Units by mouth daily., Disp: , Rfl:  diphenhydrAMINE (SOMINEX) 25 MG tablet, Take 100 mg by mouth at bedtime as needed for sleep. Pt states she is taking up to 200 mg Benadryl at night and still not sleeping, Disp: , Rfl: ;  exemestane (AROMASIN) 25 MG tablet, Take 1 tablet (25 mg total) by mouth daily after breakfast., Disp: 30 tablet, Rfl: 6 lidocaine-prilocaine (EMLA) cream, Apply 1 application topically as needed. Placeonto port site 50mn prior to treatment and cover with plastic wrap., Disp: 30 g, Rfl: 2;  loratadine-pseudoephedrine (CLARITIN-D 24-HOUR) 10-240 MG per 24 hr tablet, Take 1 tablet by mouth daily., Disp: , Rfl: ;  LORazepam (ATIVAN) 0.5 MG tablet, Take 1 tablet (0.5 mg total) by mouth every 8 (eight) hours., Disp: 30 tablet, Rfl: 0 Multiple Vitamin (MULTIVITAMIN) tablet, Take 1 tablet by mouth daily., Disp: , Rfl: ;  naproxen sodium (ANAPROX) 220 MG tablet, Take 220 mg by mouth 2 (two) times daily with a meal., Disp: , Rfl: ;  ondansetron (ZOFRAN) 8 MG tablet, Take 1 tablet (8 mg total) by mouth every 8 (eight) hours as needed for nausea or vomiting., Disp: 20 tablet, Rfl: 2 pantoprazole (PROTONIX) 40 MG tablet, Take 1 tablet (40 mg total) by mouth daily., Disp: 30 tablet, Rfl: 6;  prochlorperazine (COMPAZINE) 10 MG tablet, Take 1 tablet (10 mg total) by mouth every 6 (six) hours as needed for nausea or  vomiting., Disp: 60 tablet, Rfl: 2;  traZODone (DESYREL) 50 MG tablet, Take 1-2 if needed at bed for sleep., Disp: 60 tablet, Rfl: 3 fentaNYL (DURAGESIC) 25 MCG/HR patch, Place 1 patch (25 mcg total) onto the skin every 3 (three) days., Disp: 10 patch, Rfl: 0;  HYDROmorphone (DILAUDID) 4 MG tablet, Take 1/2 - 1 pill, IF NEEDED, for pain every 4 hrs, Disp: 90 tablet, Rfl: 0;  promethazine (PHENERGAN) 25 MG suppository, Place 1 suppository (25 mg total) rectally every 6 (six) hours as needed for nausea or vomiting., Disp: 30 each, Rfl:  3 No current facility-administered medications for this visit. Facility-Administered Medications Ordered in Other Visits: heparin lock flush 100 unit/mL, 500 Units, Intracatheter, Once PRN, [name], MD;  pertuzumab (PERJETA) 420 mg in sodium chloride 0.9 % 250 mL chemo infusion, 420 mg, Intravenous, Once, [name], MD;  sodium chloride 0.9 % injection 10 mL, 10 mL, Intracatheter, PRN, [name], MD, 10 mL at 05/14/14 1524 sodium chloride 0.9 % injection 10 mL, 10 mL, Intracatheter, PRN, [name], MD;  trastuzumab (HERCEPTIN) 588 mg in sodium chloride 0.9 % 250 mL chemo infusion, 6 mg/kg (Treatment Plan Actual), Intravenous, Once, [name], MD, Last Rate: 556 mL/hr at 07/16/14 1321, 588 mg at 07/16/14 1321  Allergies:  Allergies  Allergen Reactions  . Iohexol      Code: RASH, Desc: VERY STRONG FAMILY HX OF ANGIOEDEMA WHEN RECEIVING IV CONTRAST; PT HAS BEEN PREMEDICATED FOR OTHER CONTRASTED STUDIES(IN CATH. LAB)  KR, Onset Date: [id number]   . Prednisone Itching    Capillary beds bust  . Tetanus Toxoids Other (See Comments)    Ran a high fever for 48 hours  . Theophyllines Hives    Mental changes  . Versed [Midazolam] Other (See Comments)    Pt becomes violent    Past Medical History, Surgical history, Social history, and Family History were reviewed and updated.  Review of Systems: As above  Physical Exam:  height is _0  (1.676 m) and weight is 205 lb (92.987 kg). Her oral temperature is 97.9 F (36.6 C). Her blood pressure is 122/80 and her pulse is 88. Her respiration is 14.   We'll a well-nourished white female in no obvious distress. Head and neck exam shows no ocular or oral lesions. She has no palpable cervical or supraclavicular lymph nodes. She does have some tenderness in the right supraclavicular region. She has some fullness in this region.  In the right axilla, there is tenderness to palpation. His heart palpate any lymphadenopathy. Her  right breast did not show any obvious breast mass. Her abdomen is soft. Her lungs are clear. Her cardiac exam regular in rhythm. Extremities shows no clubbing, cyanosis or edema. She has no swelling in the right arm. She has no erythema or warmth in the right arm. Neurological exam shows no focal neurological deficits.  Lab Results  Component Value Date   WBC 6.7 07/16/2014   HGB 15.9 07/16/2014   HCT 46.9* 07/16/2014   MCV 89 07/16/2014   PLT 252 07/16/2014     Chemistry      Component Value Date/Time   NA 134 07/16/2014 1017   NA 136 03/24/2014 1147   K 4.0 07/16/2014 1017   K 4.2 03/24/2014 1147   CL 98 07/16/2014 1017   CL 105 03/24/2014 1147   CO2 25 07/16/2014 1017   CO2 21 03/24/2014 1147   BUN 8 07/16/2014 1017   BUN 15 03/24/2014  1147   CREATININE 0.8 07/16/2014 1017   CREATININE 0.83 03/24/2014 1147      Component Value Date/Time   CALCIUM 8.8 07/16/2014 1017   CALCIUM 8.8 03/24/2014 1147   ALKPHOS 101* 07/16/2014 1017   ALKPHOS 106 03/24/2014 1147   AST 51* 07/16/2014 1017   AST 19 03/24/2014 1147   ALT 62* 07/16/2014 1017   ALT 28 03/24/2014 1147   BILITOT 0.50 07/16/2014 1017   BILITOT 0.5 03/24/2014 1147         Impression and Plan: Ms. Daidone is 48 year old female. She has metastatic breast cancer. We'll have her on triple therapy for HER-2 and also on Aromasin. She's on Herceptin and Perjeta.  Again, her last PET scan done about a month or so ago show that she was responding.  We will see about an MRI. We'll see about getting this set up.  I'll have to believe that she is still responding.  I will order a PET scan also. I will and see how the rest of her body looks with respect to her cancer.  Again, we'll see if the Dilaudid and of the Duragesic  patch will help.   I spent a good half hour or so with her today.  [name], MD 9/24/20151:35 PM

## 2014-07-16 NOTE — Patient Instructions (Signed)
Zoledronic Acid injection (Hypercalcemia, Oncology) What is this medicine? ZOLEDRONIC ACID (ZOE le dron ik AS id) lowers the amount of calcium loss from bone. It is used to treat too much calcium in your blood from cancer. It is also used to prevent complications of cancer that has spread to the bone. This medicine may be used for other purposes; ask your health care provider or pharmacist if you have questions. COMMON BRAND NAME(S): Zometa What should I tell my health care provider before I take this medicine? They need to know if you have any of these conditions: -aspirin-sensitive asthma -cancer, especially if you are receiving medicines used to treat cancer -dental disease or wear dentures -infection -kidney disease -receiving corticosteroids like dexamethasone or prednisone -an unusual or allergic reaction to zoledronic acid, other medicines, foods, dyes, or preservatives -pregnant or trying to get pregnant -breast-feeding How should I use this medicine? This medicine is for infusion into a vein. It is given by a health care professional in a hospital or clinic setting. Talk to your pediatrician regarding the use of this medicine in children. Special care may be needed. Overdosage: If you think you have taken too much of this medicine contact a poison control center or emergency room at once. NOTE: This medicine is only for you. Do not share this medicine with others. What if I miss a dose? It is important not to miss your dose. Call your doctor or health care professional if you are unable to keep an appointment. What may interact with this medicine? -certain antibiotics given by injection -NSAIDs, medicines for pain and inflammation, like ibuprofen or naproxen -some diuretics like bumetanide, furosemide -teriparatide -thalidomide This list may not describe all possible interactions. Give your health care provider a list of all the medicines, herbs, non-prescription drugs, or  dietary supplements you use. Also tell them if you smoke, drink alcohol, or use illegal drugs. Some items may interact with your medicine. What should I watch for while using this medicine? Visit your doctor or health care professional for regular checkups. It may be some time before you see the benefit from this medicine. Do not stop taking your medicine unless your doctor tells you to. Your doctor may order blood tests or other tests to see how you are doing. Women should inform their doctor if they wish to become pregnant or think they might be pregnant. There is a potential for serious side effects to an unborn child. Talk to your health care professional or pharmacist for more information. You should make sure that you get enough calcium and vitamin D while you are taking this medicine. Discuss the foods you eat and the vitamins you take with your health care professional. Some people who take this medicine have severe bone, joint, and/or muscle pain. This medicine may also increase your risk for jaw problems or a broken thigh bone. Tell your doctor right away if you have severe pain in your jaw, bones, joints, or muscles. Tell your doctor if you have any pain that does not go away or that gets worse. Tell your dentist and dental surgeon that you are taking this medicine. You should not have major dental surgery while on this medicine. See your dentist to have a dental exam and fix any dental problems before starting this medicine. Take good care of your teeth while on this medicine. Make sure you see your dentist for regular follow-up appointments. What side effects may I notice from receiving this medicine? Side effects that   you should report to your doctor or health care professional as soon as possible: -allergic reactions like skin rash, itching or hives, swelling of the face, lips, or tongue -anxiety, confusion, or depression -breathing problems -changes in vision -eye pain -feeling faint or  lightheaded, falls -jaw pain, especially after dental work -mouth sores -muscle cramps, stiffness, or weakness -trouble passing urine or change in the amount of urine Side effects that usually do not require medical attention (report to your doctor or health care professional if they continue or are bothersome): -bone, joint, or muscle pain -constipation -diarrhea -fever -hair loss -irritation at site where injected -loss of appetite -nausea, vomiting -stomach upset -trouble sleeping -trouble swallowing -weak or tired This list may not describe all possible side effects. Call your doctor for medical advice about side effects. You may report side effects to FDA at 1-800-FDA-1088. Where should I keep my medicine? This drug is given in a hospital or clinic and will not be stored at home. NOTE: This sheet is a summary. It may not cover all possible information. If you have questions about this medicine, talk to your doctor, pharmacist, or health care provider.  2015, [employer] Standard. (2013-03-20 13:03:13) Pertuzumab injection What is this medicine? PERTUZUMAB (per TOOZ ue mab) is a monoclonal antibody that targets a protein called HER2. HER2 is found in some breast cancers. This medicine can stop cancer cell growth. This medicine is used with other cancer treatments. This medicine may be used for other purposes; ask your health care provider or pharmacist if you have questions. COMMON BRAND NAME(S): PERJETA What should I tell my health care provider before I take this medicine? They need to know if you have any of these conditions: -heart disease -heart failure -high blood pressure -history of irregular heart beat -recent or ongoing radiation therapy -an unusual or allergic reaction to pertuzumab, other medicines, foods, dyes, or preservatives -pregnant or trying to get pregnant -breast-feeding How should I use this medicine? This medicine is for infusion into a vein. It is  given by a health care professional in a hospital or clinic setting. Talk to your pediatrician regarding the use of this medicine in children. Special care may be needed. Overdosage: If you think you've taken too much of this medicine contact a poison control center or emergency room at once. Overdosage: If you think you have taken too much of this medicine contact a poison control center or emergency room at once. NOTE: This medicine is only for you. Do not share this medicine with others. What if I miss a dose? It is important not to miss your dose. Call your doctor or health care professional if you are unable to keep an appointment. What may interact with this medicine? Interactions are not expected. Give your health care provider a list of all the medicines, herbs, non-prescription drugs, or dietary supplements you use. Also tell them if you smoke, drink alcohol, or use illegal drugs. Some items may interact with your medicine. This list may not describe all possible interactions. Give your health care provider a list of all the medicines, herbs, non-prescription drugs, or dietary supplements you use. Also tell them if you smoke, drink alcohol, or use illegal drugs. Some items may interact with your medicine. What should I watch for while using this medicine? Your condition will be monitored carefully while you are receiving this medicine. Report any side effects. Continue your course of treatment even though you feel ill unless your doctor tells you to stop.  Do not become pregnant while taking this medicine. Women should inform their doctor if they wish to become pregnant or think they might be pregnant. There is a potential for serious side effects to an unborn child. Talk to your health care professional or pharmacist for more information. Do not breast-feed an infant while taking this medicine. Call your doctor or health care professional for advice if you get a fever, chills or sore throat,  or other symptoms of a cold or flu. Do not treat yourself. Try to avoid being around people who are sick. You may experience fever, chills, and headache during the infusion. Report any side effects during the infusion to your health care professional. What side effects may I notice from receiving this medicine? Side effects that you should report to your doctor or health care professional as soon as possible: -breathing problems -chest pain or palpitations -dizziness -feeling faint or lightheaded -fever or chills -skin rash, itching or hives -sore throat -swelling of the face, lips, or tongue -swelling of the legs or ankles -unusually weak or tired Side effects that usually do not require medical attention (Report these to your doctor or health care professional if they continue or are bothersome.): -diarrhea -hair loss -nausea, vomiting -tiredness This list may not describe all possible side effects. Call your doctor for medical advice about side effects. You may report side effects to FDA at 1-800-FDA-1088. Where should I keep my medicine? This drug is given in a hospital or clinic and will not be stored at home. NOTE: This sheet is a summary. It may not cover all possible information. If you have questions about this medicine, talk to your doctor, pharmacist, or health care provider.  2015, [employer] Standard. (2012-08-07 16:54:15) Trastuzumab injection for infusion What is this medicine? TRASTUZUMAB (tras TOO zoo mab) is a monoclonal antibody. It targets a protein called HER2. This protein is found in some stomach and breast cancers. This medicine can stop cancer cell growth. This medicine may be used with other cancer treatments. This medicine may be used for other purposes; ask your health care provider or pharmacist if you have questions. COMMON BRAND NAME(S): Herceptin What should I tell my health care provider before I take this medicine? They need to know if you have any  of these conditions: -heart disease -heart failure -infection (especially a virus infection such as chickenpox, cold sores, or herpes) -lung or breathing disease, like asthma -recent or ongoing radiation therapy -an unusual or allergic reaction to trastuzumab, benzyl alcohol, or other medications, foods, dyes, or preservatives -pregnant or trying to get pregnant -breast-feeding How should I use this medicine? This drug is given as an infusion into a vein. It is administered in a hospital or clinic by a specially trained health care professional. Talk to your pediatrician regarding the use of this medicine in children. This medicine is not approved for use in children. Overdosage: If you think you have taken too much of this medicine contact a poison control center or emergency room at once. NOTE: This medicine is only for you. Do not share this medicine with others. What if I miss a dose? It is important not to miss a dose. Call your doctor or health care professional if you are unable to keep an appointment. What may interact with this medicine? -cyclophosphamide -doxorubicin -warfarin This list may not describe all possible interactions. Give your health care provider a list of all the medicines, herbs, non-prescription drugs, or dietary supplements you use. Also tell  them if you smoke, drink alcohol, or use illegal drugs. Some items may interact with your medicine. What should I watch for while using this medicine? Visit your doctor for checks on your progress. Report any side effects. Continue your course of treatment even though you feel ill unless your doctor tells you to stop. Call your doctor or health care professional for advice if you get a fever, chills or sore throat, or other symptoms of a cold or flu. Do not treat yourself. Try to avoid being around people who are sick. You may experience fever, chills and shaking during your first infusion. These effects are usually mild and  can be treated with other medicines. Report any side effects during the infusion to your health care professional. Fever and chills usually do not happen with later infusions. What side effects may I notice from receiving this medicine? Side effects that you should report to your doctor or other health care professional as soon as possible: -breathing difficulties -chest pain or palpitations -cough -dizziness or fainting -fever or chills, sore throat -skin rash, itching or hives -swelling of the legs or ankles -unusually weak or tired Side effects that usually do not require medical attention (report to your doctor or other health care professional if they continue or are bothersome): -loss of appetite -headache -muscle aches -nausea This list may not describe all possible side effects. Call your doctor for medical advice about side effects. You may report side effects to FDA at 1-800-FDA-1088. Where should I keep my medicine? This drug is given in a hospital or clinic and will not be stored at home. NOTE: This sheet is a summary. It may not cover all possible information. If you have questions about this medicine, talk to your doctor, pharmacist, or health care provider.  2015, [employer] Standard. (2009-08-13 13:43:15)

## 2014-07-17 LAB — LACTATE DEHYDROGENASE: LDH: 190 U/L (ref 94–250)

## 2014-07-17 LAB — CANCER ANTIGEN 27.29: CA 27.29: 38 U/mL (ref 0–39)

## 2014-07-18 ENCOUNTER — Ambulatory Visit (HOSPITAL_BASED_OUTPATIENT_CLINIC_OR_DEPARTMENT_OTHER)
Admission: RE | Admit: 2014-07-18 | Discharge: 2014-07-18 | Disposition: A | Discharge status: Home / Self Care | Payer: 59 | Source: Ambulatory Visit | Attending: Hematology & Oncology | Admitting: Hematology & Oncology

## 2014-07-18 DIAGNOSIS — C50919 Malignant neoplasm of unspecified site of unspecified female breast: Secondary | ICD-10-CM | POA: Insufficient documentation

## 2014-07-18 DIAGNOSIS — Z79899 Other long term (current) drug therapy: Secondary | ICD-10-CM | POA: Insufficient documentation

## 2014-07-18 DIAGNOSIS — C800 Disseminated malignant neoplasm, unspecified: Secondary | ICD-10-CM | POA: Insufficient documentation

## 2014-07-18 DIAGNOSIS — C50911 Malignant neoplasm of unspecified site of right female breast: Secondary | ICD-10-CM

## 2014-07-18 MED ORDER — GADOBENATE DIMEGLUMINE 529 MG/ML IV SOLN
19.0000 mL | Freq: Once | INTRAVENOUS | Status: AC | PRN
  Administered 2014-07-18: 19 mL via INTRAVENOUS

## 2014-07-24 ENCOUNTER — Other Ambulatory Visit: Payer: Self-pay | Admitting: *Deleted

## 2014-07-24 DIAGNOSIS — C50919 Malignant neoplasm of unspecified site of unspecified female breast: Secondary | ICD-10-CM

## 2014-07-24 NOTE — Progress Notes (Signed)
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  Past/Anticipated chemotherapy by medical oncology, if any: Aromasin 25 mg by mouth q. Day, Zometa 4 mg IV Q3 weeks, Herceptin/Perjeta IV Q3 weeks  Patient's main complaints related to symptomatic tumor(s) are: Patient is having pain and numbnes in the right upper arm/under arm area. Dr. [name] suspects that she probably has some brachial plexus involvement by lymphadenopathy  Pain on a scale of 0-10 is: 4/10.  Patient has a fentanyl patch (25 mcg) and takes dilaudid 1-2 times a day as needed.  Patient also takes tramadol ER and regular tablets.   SAFETY ISSUES:  Prior radiation? no  Pacemaker/ICD? no  Possible current pregnancy? no  Is the patient on methotrexate? no  GYN history:  Patient does not have any children.  12 years at first period.  Used BCP for 25 years.  She has had a tubal ligation.   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  She reports nausea with chemo and has lost 4 lbs since 9/24.  She is using zofran, compazine and phenergan suppositories.

## 2014-07-28 ENCOUNTER — Encounter: Payer: Self-pay | Admitting: Radiation Oncology

## 2014-07-28 ENCOUNTER — Ambulatory Visit
Admission: RE | Admit: 2014-07-28 | Discharge: 2014-07-28 | Disposition: A | Discharge status: Home / Self Care | Payer: 59 | Source: Ambulatory Visit | Attending: Radiation Oncology | Admitting: Radiation Oncology

## 2014-07-28 VITALS — BP 128/83 | HR 81 | Temp 98.5°F | Resp 16 | Ht 66.0 in | Wt 201.1 lb

## 2014-07-28 DIAGNOSIS — C799 Secondary malignant neoplasm of unspecified site: Secondary | ICD-10-CM | POA: Diagnosis not present

## 2014-07-28 DIAGNOSIS — Z791 Long term (current) use of non-steroidal anti-inflammatories (NSAID): Secondary | ICD-10-CM | POA: Diagnosis not present

## 2014-07-28 DIAGNOSIS — Z510 Encounter for antineoplastic radiation therapy: Secondary | ICD-10-CM | POA: Diagnosis present

## 2014-07-28 DIAGNOSIS — C50911 Malignant neoplasm of unspecified site of right female breast: Secondary | ICD-10-CM

## 2014-07-28 DIAGNOSIS — Z87891 Personal history of nicotine dependence: Secondary | ICD-10-CM | POA: Insufficient documentation

## 2014-07-28 DIAGNOSIS — Z79811 Long term (current) use of aromatase inhibitors: Secondary | ICD-10-CM | POA: Insufficient documentation

## 2014-07-28 DIAGNOSIS — Z79891 Long term (current) use of opiate analgesic: Secondary | ICD-10-CM | POA: Insufficient documentation

## 2014-07-28 DIAGNOSIS — Z9049 Acquired absence of other specified parts of digestive tract: Secondary | ICD-10-CM | POA: Insufficient documentation

## 2014-07-28 DIAGNOSIS — Z7982 Long term (current) use of aspirin: Secondary | ICD-10-CM | POA: Diagnosis not present

## 2014-07-28 DIAGNOSIS — Z79899 Other long term (current) drug therapy: Secondary | ICD-10-CM | POA: Insufficient documentation

## 2014-07-28 NOTE — Progress Notes (Signed)
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 female who is seen out courtesy of Dr. [name] for an opinion concerning radiation therapy as part of management of patient's advanced right breast cancer. The patient presented this spring with discomfort in the right axillary area. She was ultimately found to have stage IV right breast cancer with liver involvement and osseous metastasis. The patient is been receiving Herceptin/Perjeta IV Q3 weeks as well as Zometa and Aromasin. On imaging patient has been noted to have a positive response to her therapy. More recently however the patient has noticed more discomfort in the axillary and supraclavicular region. She is also noticed numbness along the upper part of her right arm. An MRI of the brachial plexus area revealed enhancing right supraclavicular lymph node measuring 1.3 x 1.9 cm.and this area is impinging along the anterior components of the right brachial plexus. Given the patient's symptoms and x-ray findings she is now seen in radiation oncology for consideration for palliative treatment.Marland Kitchen  PREVIOUS RADIATION THERAPY: No  PAST MEDICAL HISTORY:  has a past medical history of Allergy; Arthritis; Asthma; Coronary artery spasm; PONV (postoperative nausea and vomiting); Family history of anesthesia complication; Sleep apnea; Pneumonia; UTI (lower urinary tract infection); Cancer; and Hypertension.    PAST SURGICAL HISTORY: Past Surgical History  Procedure Laterality Date  . Cholecystectomy    . Knee surgery      right  . Tendon repair      finger  . Tonsillectomy    . Sinus  surgery with instatrak      without instatrak  . Tubal ligation    . Colonoscopy w/ polypectomy  2008  . Wisdom tooth extraction    . Cardiac catheterization  X 3    no PCI  . Portacath placement Left 03/26/2014    Procedure: INSERTION PORT-A-CATH;  Surgeon: Rolm Bookbinder, MD;  Location: [facility];  Service: General;  Laterality: Left;  . Breast biopsy with sentinel lymph node biopsy and needle localization      FAMILY HISTORY: family history includes Cancer in her maternal grandmother, paternal aunt, paternal grandfather, and paternal grandmother; Coronary artery disease in her father, maternal grandfather, maternal grandmother, mother, and paternal grandmother; Depression in her maternal grandfather and maternal grandmother; Diabetes in her mother; Hypertension in her father, mother, sister, and sister; Stroke in her maternal grandfather and maternal grandmother.  SOCIAL HISTORY:  reports that she quit smoking about 6 years ago. Her smoking use included Cigarettes. She started smoking about 29 years ago. She has a 12 pack-year smoking history. She has never used smokeless tobacco. She reports that she drinks alcohol. She reports that she does not use illicit drugs.  ALLERGIES: Iohexol; Prednisone; Tetanus toxoids; Theophyllines; and Versed  MEDICATIONS:  Current Outpatient Prescriptions  Medication Sig Dispense Refill  . albuterol (PROVENTIL HFA;VENTOLIN HFA) 108 (90 BASE) MCG/ACT inhaler Inhale 2 puffs into the lungs every 4 (four) hours as needed for wheezing or shortness of breath.  2 Inhaler  6  . ALPRAZolam (XANAX) 1 MG tablet Take 1 tablet (1 mg total) by mouth at bedtime as needed for anxiety.  30 tablet  2  .  aspirin (ASPIRIN EC) 81 MG EC tablet Take 81 mg by mouth daily. Swallow whole.      . Bismuth Subsalicylate (PEPTO-BISMOL PO) Take by mouth as needed.      . calcium carbonate (TUMS - DOSED IN MG ELEMENTAL CALCIUM) 500 MG chewable tablet Chew 2 tablets by mouth 2 (two) times  daily.      . Cholecalciferol (VITAMIN D) 2000 UNITS tablet Take 2,000 Units by mouth daily.      . diphenhydrAMINE (SOMINEX) 25 MG tablet Take 100 mg by mouth at bedtime as needed for sleep. Pt states she is taking up to 200 mg Benadryl at night and still not sleeping      . exemestane (AROMASIN) 25 MG tablet Take 1 tablet (25 mg total) by mouth daily after breakfast.  30 tablet  6  . fentaNYL (DURAGESIC) 25 MCG/HR patch Place 1 patch (25 mcg total) onto the skin every 3 (three) days.  10 patch  0  . HYDROmorphone (DILAUDID) 4 MG tablet Take 1/2 - 1 pill, IF NEEDED, for pain every 4 hrs  90 tablet  0  . lidocaine-prilocaine (EMLA) cream Apply 1 application topically as needed. Placeonto port site 83min prior to treatment and cover with plastic wrap.  30 g  2  . loratadine-pseudoephedrine (CLARITIN-D 24-HOUR) 10-240 MG per 24 hr tablet Take 1 tablet by mouth daily.      Marland Kitchen LORazepam (ATIVAN) 0.5 MG tablet Take 1 tablet (0.5 mg total) by mouth every 8 (eight) hours.  30 tablet  0  . Multiple Vitamin (MULTIVITAMIN) tablet Take 1 tablet by mouth daily.      . naproxen sodium (ANAPROX) 220 MG tablet Take 220 mg by mouth 2 (two) times daily with a meal.      . ondansetron (ZOFRAN) 8 MG tablet Take 1 tablet (8 mg total) by mouth every 8 (eight) hours as needed for nausea or vomiting.  20 tablet  2  . pantoprazole (PROTONIX) 40 MG tablet Take 1 tablet (40 mg total) by mouth daily.  30 tablet  6  . prochlorperazine (COMPAZINE) 10 MG tablet Take 1 tablet (10 mg total) by mouth every 6 (six) hours as needed for nausea or vomiting.  60 tablet  2  . promethazine (PHENERGAN) 25 MG suppository Place 1 suppository (25 mg total) rectally every 6 (six) hours as needed for nausea or vomiting.  30 each  3  . traZODone (DESYREL) 50 MG tablet Take 1-2 if needed at bed for sleep.  60 tablet  3   No current facility-administered medications for this encounter.   Facility-Administered Medications Ordered in Other  Encounters  Medication Dose Route Frequency Provider Last Rate Last Dose  . sodium chloride 0.9 % injection 10 mL  10 mL Intracatheter PRN [name], MD   10 mL at 05/14/14 1524    REVIEW OF SYSTEMS:  A 15 point review of systems is documented in the electronic medical record. This was obtained by the nursing staff. However, I reviewed this with the patient to discuss relevant findings and make appropriate changes.  The patient has pain in the right axillary and supra-clavicular region as well as the upper part of the breast. She also admits to numbness and paresthesias involving her right upper arm area. She denies any numbness or weakness in her right hand. Patient denies any other areas of pain. She denies any headaches. She has had some nausea related to her chemotherapy. She denies any numbness or weakness in  her lower extremities. 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 Her oral temperature is 98.5 F (36.9 C). Her blood pressure is 128/83 and her pulse is 81. Her respiration is 16.   BP 128/83  Pulse 81  Temp(Src) 98.5 F (36.9 C) (Oral)  Resp 16  Ht 5\' 6"  (1.676 m)  Wt 201 lb 1.6 oz (91.218 kg)  BMI 32.47 kg/m2  LMP 06/09/2014  General Appearance:    Alert, cooperative, no distress, appears stated age  Head:    Normocephalic, without obvious abnormality, atraumatic  Eyes:    PERRL, conjunctiva/corneas clear, EOM's intact,         Nose:   Nares normal, septum midline, mucosa normal, no drainage    or sinus tenderness  Throat:   Lips, mucosa, and tongue normal; teeth and gums normal  Neck:   Supple, symmetrical, trachea midline, no adenopathy;    thyroid:  no enlargement/tenderness/nodules; no carotid   bruit or JVD  Back:     Symmetric, no curvature, ROM normal, no CVA tenderness  Lungs:     Clear to auscultation bilaterally, respirations  unlabored  Chest Wall:    No tenderness or deformity   Heart:    Regular rate and rhythm, S1 and S2 normal, no murmur, rub   or gallop  Breast Exam:    Left breast:No tenderness, masses, or nipple abnormality, the patient has a palpable mass ~ 1.5 cm in the 12:00 position of the right breast which is tender with palpation   Abdomen:     Soft, non-tender, bowel sounds active all four quadrants,    no masses, no organomegaly        Extremities:   Extremities normal, atraumatic, no cyanosis or edema  Pulses:   2+ and symmetric all extremities  Skin:   Skin color, texture, turgor normal, no rashes or lesions  Lymph nodes:   Cervical, palpable right supraclavicular node approximally 1-1/2 cm which is quite tender with palpation,  the patient is exquisitely tender with palpation in the right axillary region but no obvious palpable adenopathy in this area, the left supraclavicular and axillary areas are free of adenopathy   Neurologic:   CNII-XII intact, normal strength, sensation and reflexes    throughout     ECOG = 1   1 - Symptomatic but completely ambulatory (Restricted in physically strenuous activity but ambulatory and able to carry out work of a light or sedentary nature. For example, light housework, office work)  LABORATORY DATA:  Lab Results  Component Value Date   WBC 6.7 07/16/2014   HGB 15.9 07/16/2014   HCT 46.9* 07/16/2014   MCV 89 07/16/2014   PLT 252 07/16/2014   NEUTROABS 4.2 07/16/2014   Lab Results  Component Value Date   NA 134 07/16/2014   K 4.0 07/16/2014   CL 98 07/16/2014   CO2 25 07/16/2014   GLUCOSE 93 07/16/2014   CREATININE 0.8 07/16/2014   CALCIUM 8.8 07/16/2014      RADIOGRAPHY: Mr Chest W Wo Contrast  07/18/2014   CLINICAL DATA:  Right breast cancer diagnosed 4 months ago undergo wing chemotherapy. Right chest and neck swelling with numbness going into the right arm. Evaluate for brachial plexopathy.  EXAM: MRI BRACHIAL PLEXUS WITHOUT AND WITH CONTRAST   TECHNIQUE: Multiplanar, multiecho pulse sequences of the  neck and surrounding structures were obtained without and with intravenous contrast. The field of view was focused on the left/right brachial plexus from the neural foramina to the axilla.  CONTRAST:  68mL MULTIHANCE GADOBENATE DIMEGLUMINE 529 MG/ML IV SOLN  COMPARISON:  PET-CT 08/2014.  FINDINGS: The enhancing right supraclavicular lymph node measures 1.3 x 1.9 cm and was hypermetabolic on prior PET-CT. There are additional adjacent smaller lymph nodes along the right subclavian vessels which could contribute to impingement on the anterior components of the right brachial plexus.  Multiple subpectoral and right axillary lymph nodes are again noted, incompletely visualized in the axial plane, although grossly unchanged from prior PET-CT. There is soft tissue edema within the right axilla, suggesting interval radiation change or lymphedema. There is no evidence of vascular occlusion.  No paraspinal soft tissue mass or osseous metastases demonstrated. The visualized cervical thoracic cord appears normal. There is a moderate-sized disc extrusion at C5-6 with caudal migration of disc material behind the C6 vertebral body. This is not show any enhancement to suggest epidural tumor. There is no resulting cord deformity or high-grade foraminal stenosis.  The coronal images demonstrate increased partial collapse of the right middle and lower lobes. 1.3 cm left thyroid nodule demonstrates T2 and T1 hyperintensity, not hypermetabolic on prior PET-CT.  IMPRESSION: 1. Multiple nodal metastases are again noted within the right supraclavicular, subpectoral and axillary stations. Compared with the PET-CT done approximately 7 weeks ago, these have not significantly changed in size. 2. The supraclavicular nodes could contribute to impingement on the anterior components of the brachial plexus. Alternatively, symptoms may be related to radiation therapy for lymphedema as soft  tissue edema is noted within the right axilla. 3. Central disc extrusion at C5-6 without cord deformity or definite foraminal compromise. 4. Progressive right basilar pulmonary atelectasis.   Electronically Signed   By: Camie Patience M.D.   On: 07/18/2014 11:30   Nm Cardiac [facility] Rest  07/02/2014   CLINICAL DATA:  Breast cancer.  Evaluate cardiac function  EXAM: NUCLEAR MEDICINE CARDIAC BLOOD POOL IMAGING ([facility])  TECHNIQUE: Cardiac multi-gated acquisition was performed at rest following intravenous injection of Tc-4m labeled red blood cells.  RADIOPHARMACEUTICALS:  25.0 MCiTc-21m in-vitro labeled red blood cells.  COMPARISON:  [facility] scan 04/01/2014  FINDINGS: No focal wall motion abnormality left ventricle.  Calculated left ventricular ejection fraction equals 58%. This compares to 58% on most recent comparison.  IMPRESSION: Left ventricular ejection fraction =  58%, unchanged from 04/01/2014   Electronically Signed   By: Suzy Bouchard M.D.   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  The patient is becoming symptomatic from her disease in the right axillary and supraclavicular region. On recent imaging she has  impingement along the brachial plexus which may be explaining some of her symptoms. Patient would be a candidate for palliative radiation therapy directed at the right axillary and supraclavicular region. I may also consider treatment to the right breast area as she is having some pain in the right upper breast in addition. I discussed the treatment course side effects and potential toxicities of radiation therapy in this situation with the patient. She appears to understand and wishes to proceed with planned course of treatment.  PLAN: Simulation and planning October 8 with treatments to begin next week. Anticipate approximately 5 and half weeks of radiation therapy as part of patient's overall management.       ------------------------------------------------  Blair Promise, PhD, MD

## 2014-07-28 NOTE — Addendum Note (Signed)
Encounter addended by: Jacqulyn Liner, RN on: 07/28/2014 11:27 AM<BR>     Documentation filed: Visit Diagnoses, Charges VN

## 2014-07-28 NOTE — Progress Notes (Signed)
Please see the Nurse Progress Note in the MD Initial Consult Encounter for this patient. 

## 2014-07-30 ENCOUNTER — Encounter (HOSPITAL_COMMUNITY)
Admission: RE | Admit: 2014-07-30 | Discharge: 2014-07-30 | Disposition: A | Discharge status: Home / Self Care | Payer: 59 | Source: Ambulatory Visit | Attending: Hematology & Oncology | Admitting: Hematology & Oncology

## 2014-07-30 ENCOUNTER — Ambulatory Visit
Admission: RE | Admit: 2014-07-30 | Discharge: 2014-07-30 | Disposition: A | Discharge status: Home / Self Care | Payer: 59 | Source: Ambulatory Visit | Attending: Radiation Oncology | Admitting: Radiation Oncology

## 2014-07-30 ENCOUNTER — Encounter: Payer: Self-pay | Admitting: *Deleted

## 2014-07-30 DIAGNOSIS — C50911 Malignant neoplasm of unspecified site of right female breast: Secondary | ICD-10-CM | POA: Diagnosis not present

## 2014-07-30 DIAGNOSIS — Z510 Encounter for antineoplastic radiation therapy: Secondary | ICD-10-CM | POA: Diagnosis not present

## 2014-07-30 DIAGNOSIS — C799 Secondary malignant neoplasm of unspecified site: Secondary | ICD-10-CM | POA: Diagnosis present

## 2014-07-30 LAB — GLUCOSE, CAPILLARY: Glucose-Capillary: 106 mg/dL — ABNORMAL HIGH (ref 70–99)

## 2014-07-30 MED ORDER — FLUDEOXYGLUCOSE F - 18 (FDG) INJECTION
10.0600 | Freq: Once | INTRAVENOUS | Status: AC | PRN
  Administered 2014-07-30: 10.06 via INTRAVENOUS

## 2014-07-30 NOTE — Progress Notes (Signed)
[facility] Psychosocial Distress Screening Clinical Social Work  Clinical Social Work was referred by distress screening protocol.  The patient scored a 7 on the Psychosocial Distress Thermometer which indicates severe distress. Clinical Social Worker phoned pt to assess for distress and other psychosocial needs. CSW left message and will try to check in on patient as she begins radiation treatment.   ONCBCN DISTRESS SCREENING 07/28/2014  Screening Type Initial Screening  Elta [place] the number that describes how much distress you have been experiencing in the past week 7  Practical problem type Work/school;Transportation  Emotional problem type Adjusting to illness  Physical Problem type Pain;Nausea/vomiting;Sleep/insomnia;Constipation/diarrhea;Swollen arms/legs  Physician notified of physical symptoms Yes    Clinical Social Worker follow up needed: Yes.    If yes, follow up plan:  See above Loren Racer, [place] Worker [facility]  [facility] Phone: [phone] Fax: [phone]

## 2014-08-01 NOTE — Addendum Note (Signed)
Encounter addended by: Blair Promise, MD on: 08/01/2014 11:31 AM<BR>     Documentation filed: Notes Section

## 2014-08-01 NOTE — Progress Notes (Signed)
  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  Identity was confirmed.  All relevant records and images related to the planned course of therapy were reviewed.  The patient freely provided informed written consent to proceed with treatment after reviewing the details related to the planned course of therapy. The consent form was witnessed and verified by the simulation staff.  Then, the patient was set-up in a stable reproducible  supine position for radiation therapy.  CT images were obtained.  Surface markings were placed.  The CT images were loaded into the planning software.  Then the target and avoidance structures were contoured.  Treatment planning then occurred.  The radiation prescription was entered and confirmed.  Then, I designed and supervised the construction of a total of 5 medically necessary complex treatment devices.  I have requested : 3D Simulation  I have requested a DVH of the following structures: axilla, breast mass, lungs, heart.  I have ordered:dose calc.  PLAN:  The patient will receive 45 Gy in 25 fractions using a 4 field setup encompassing the right breast,  axillary and supraclavicular region with  consideration for boost to the axillary lymph nodes.  ________________________________  Blair Promise,  M.D.

## 2014-08-01 NOTE — Progress Notes (Signed)
  Radiation Oncology         [phone] ________________________________  Name: Bianca Kent MRN: [id number]  Date: 07/30/2014  DOB: 03/07/1966  Optical Surface Tracking Plan:  Since intensity modulated radiotherapy (IMRT) and 3D conformal radiation treatment methods are predicated on accurate and precise positioning for treatment, intrafraction motion monitoring is medically necessary to ensure accurate and safe treatment delivery.  The ability to quantify intrafraction motion without excessive ionizing radiation dose can only be performed with optical surface tracking. Accordingly, surface imaging offers the opportunity to obtain 3D measurements of patient position throughout IMRT and 3D treatments without excessive radiation exposure.  I am ordering optical surface tracking for this patient's upcoming course of radiotherapy. ________________________________  Blair Promise, MD 08/01/2014 11:30 AM    Reference:   Ursula Alert, J, et al. Surface imaging-based analysis of intrafraction motion for breast radiotherapy patients.Journal of [place], n. 6, nov. 2014. ISSN [id number].   Available at: <http://www.jacmp.org/index.php/jacmp/article/view/4957>.

## 2014-08-03 DIAGNOSIS — Z510 Encounter for antineoplastic radiation therapy: Secondary | ICD-10-CM | POA: Diagnosis not present

## 2014-08-05 ENCOUNTER — Ambulatory Visit
Admission: RE | Admit: 2014-08-05 | Discharge: 2014-08-05 | Disposition: A | Discharge status: Home / Self Care | Payer: 59 | Source: Ambulatory Visit | Attending: Radiation Oncology | Admitting: Radiation Oncology

## 2014-08-05 DIAGNOSIS — C50911 Malignant neoplasm of unspecified site of right female breast: Secondary | ICD-10-CM

## 2014-08-05 DIAGNOSIS — Z510 Encounter for antineoplastic radiation therapy: Secondary | ICD-10-CM | POA: Diagnosis not present

## 2014-08-05 NOTE — Progress Notes (Signed)
  Radiation Oncology         (336) [phone] ________________________________  Name: Bianca Kent MRN: [id number]  Date: 08/05/2014  DOB: [birth year]  Simulation Verification Note    ICD-9-CM ICD-10-CM  1. Breast cancer metastasized to multiple sites, right 174.9 C50.911   199.0 C79.9    Status: outpatient  NARRATIVE: The patient was brought to the treatment unit and placed in the planned treatment position. The clinical setup was verified. Then port films were obtained and uploaded to the radiation oncology medical record software.  The treatment beams were carefully compared against the planned radiation fields. The position location and shape of the radiation fields was reviewed. They targeted volume of tissue appears to be appropriately covered by the radiation beams. Organs at risk appear to be excluded as planned.  Based on my personal review, I approved the simulation verification. The patient's treatment will proceed as planned.  -----------------------------------  Blair Promise, PhD, MD

## 2014-08-06 ENCOUNTER — Other Ambulatory Visit (HOSPITAL_BASED_OUTPATIENT_CLINIC_OR_DEPARTMENT_OTHER): Payer: 59 | Admitting: Lab

## 2014-08-06 ENCOUNTER — Encounter: Payer: Self-pay | Admitting: Hematology & Oncology

## 2014-08-06 ENCOUNTER — Ambulatory Visit (HOSPITAL_BASED_OUTPATIENT_CLINIC_OR_DEPARTMENT_OTHER): Payer: 59 | Admitting: Hematology & Oncology

## 2014-08-06 ENCOUNTER — Ambulatory Visit
Admission: RE | Admit: 2014-08-06 | Discharge: 2014-08-06 | Disposition: A | Discharge status: Home / Self Care | Payer: 59 | Source: Ambulatory Visit | Attending: Radiation Oncology | Admitting: Radiation Oncology

## 2014-08-06 ENCOUNTER — Ambulatory Visit (HOSPITAL_BASED_OUTPATIENT_CLINIC_OR_DEPARTMENT_OTHER): Payer: 59

## 2014-08-06 VITALS — BP 156/99 | HR 82 | Temp 98.2°F | Resp 16 | Ht 66.0 in | Wt 196.0 lb

## 2014-08-06 DIAGNOSIS — C787 Secondary malignant neoplasm of liver and intrahepatic bile duct: Secondary | ICD-10-CM

## 2014-08-06 DIAGNOSIS — R112 Nausea with vomiting, unspecified: Secondary | ICD-10-CM

## 2014-08-06 DIAGNOSIS — Z5112 Encounter for antineoplastic immunotherapy: Secondary | ICD-10-CM

## 2014-08-06 DIAGNOSIS — C50811 Malignant neoplasm of overlapping sites of right female breast: Secondary | ICD-10-CM

## 2014-08-06 DIAGNOSIS — C773 Secondary and unspecified malignant neoplasm of axilla and upper limb lymph nodes: Secondary | ICD-10-CM

## 2014-08-06 DIAGNOSIS — C7951 Secondary malignant neoplasm of bone: Secondary | ICD-10-CM

## 2014-08-06 DIAGNOSIS — C50911 Malignant neoplasm of unspecified site of right female breast: Secondary | ICD-10-CM

## 2014-08-06 DIAGNOSIS — R110 Nausea: Secondary | ICD-10-CM

## 2014-08-06 DIAGNOSIS — Z510 Encounter for antineoplastic radiation therapy: Secondary | ICD-10-CM | POA: Diagnosis not present

## 2014-08-06 LAB — CMP (CANCER CENTER ONLY)
ALK PHOS: 92 U/L — AB (ref 26–84)
ALT(SGPT): 59 U/L — ABNORMAL HIGH (ref 10–47)
AST: 32 U/L (ref 11–38)
Albumin: 4 g/dL (ref 3.3–5.5)
BILIRUBIN TOTAL: 0.4 mg/dL (ref 0.20–1.60)
BUN: 12 mg/dL (ref 7–22)
CO2: 23 meq/L (ref 18–33)
CREATININE: 0.7 mg/dL (ref 0.6–1.2)
Calcium: 9.2 mg/dL (ref 8.0–10.3)
Chloride: 102 mEq/L (ref 98–108)
GLUCOSE: 144 mg/dL — AB (ref 73–118)
Potassium: 3.5 mEq/L (ref 3.3–4.7)
Sodium: 140 mEq/L (ref 128–145)
Total Protein: 7.5 g/dL (ref 6.4–8.1)

## 2014-08-06 LAB — CBC WITH DIFFERENTIAL (CANCER CENTER ONLY)
BASO#: 0 10*3/uL (ref 0.0–0.2)
BASO%: 0.3 % (ref 0.0–2.0)
EOS%: 1.5 % (ref 0.0–7.0)
Eosinophils Absolute: 0.1 10*3/uL (ref 0.0–0.5)
HEMATOCRIT: 46.5 % (ref 34.8–46.6)
HGB: 16.3 g/dL — ABNORMAL HIGH (ref 11.6–15.9)
LYMPH#: 1.7 10*3/uL (ref 0.9–3.3)
LYMPH%: 22 % (ref 14.0–48.0)
MCH: 30.6 pg (ref 26.0–34.0)
MCHC: 35.1 g/dL (ref 32.0–36.0)
MCV: 87 fL (ref 81–101)
MONO#: 0.4 10*3/uL (ref 0.1–0.9)
MONO%: 5.4 % (ref 0.0–13.0)
NEUT%: 70.8 % (ref 39.6–80.0)
NEUTROS ABS: 5.6 10*3/uL (ref 1.5–6.5)
Platelets: 289 10*3/uL (ref 145–400)
RBC: 5.32 10*6/uL (ref 3.70–5.32)
RDW: 13.4 % (ref 11.1–15.7)
WBC: 7.9 10*3/uL (ref 3.9–10.0)

## 2014-08-06 LAB — CANCER ANTIGEN 27.29: CA 27.29: 41 U/mL — ABNORMAL HIGH (ref 0–39)

## 2014-08-06 LAB — LACTATE DEHYDROGENASE: LDH: 187 U/L (ref 94–250)

## 2014-08-06 MED ORDER — SODIUM CHLORIDE 0.9 % IV SOLN
Freq: Once | INTRAVENOUS | Status: AC
  Administered 2014-08-06: 11:00:00 via INTRAVENOUS

## 2014-08-06 MED ORDER — DIPHENHYDRAMINE HCL 25 MG PO CAPS
50.0000 mg | ORAL_CAPSULE | Freq: Once | ORAL | Status: AC
  Administered 2014-08-06: 50 mg via ORAL

## 2014-08-06 MED ORDER — ZOLEDRONIC ACID 4 MG/100ML IV SOLN
4.0000 mg | Freq: Once | INTRAVENOUS | Status: AC
  Administered 2014-08-06: 4 mg via INTRAVENOUS
  Filled 2014-08-06: qty 100

## 2014-08-06 MED ORDER — ACETAMINOPHEN 325 MG PO TABS
650.0000 mg | ORAL_TABLET | Freq: Once | ORAL | Status: AC
  Administered 2014-08-06: 650 mg via ORAL

## 2014-08-06 MED ORDER — HEPARIN SOD (PORK) LOCK FLUSH 100 UNIT/ML IV SOLN
500.0000 [IU] | Freq: Once | INTRAVENOUS | Status: AC | PRN
  Administered 2014-08-06: 500 [IU]
  Filled 2014-08-06: qty 5

## 2014-08-06 MED ORDER — FENTANYL 50 MCG/HR TD PT72: 50.0000 ug | MEDICATED_PATCH | TRANSDERMAL | Status: DC

## 2014-08-06 MED ORDER — SODIUM CHLORIDE 0.9 % IV SOLN
3.6000 mg/kg | Freq: Once | INTRAVENOUS | Status: AC
  Administered 2014-08-06: 320 mg via INTRAVENOUS
  Filled 2014-08-06: qty 16

## 2014-08-06 MED ORDER — GRANISETRON 3.1 MG/24HR TD PTCH: MEDICATED_PATCH | TRANSDERMAL | Status: DC

## 2014-08-06 MED ORDER — DIPHENHYDRAMINE HCL 25 MG PO CAPS
ORAL_CAPSULE | ORAL | Status: AC
  Filled 2014-08-06: qty 1

## 2014-08-06 MED ORDER — SODIUM CHLORIDE 0.9 % IJ SOLN
10.0000 mL | INTRAMUSCULAR | Status: DC | PRN
  Administered 2014-08-06: 10 mL
  Filled 2014-08-06: qty 10

## 2014-08-06 MED ORDER — PALONOSETRON HCL INJECTION 0.25 MG/5ML
0.2500 mg | Freq: Once | INTRAVENOUS | Status: AC
  Administered 2014-08-06: 0.25 mg via INTRAVENOUS

## 2014-08-06 MED ORDER — PALONOSETRON HCL INJECTION 0.25 MG/5ML
INTRAVENOUS | Status: AC
  Filled 2014-08-06: qty 5

## 2014-08-06 MED ORDER — ACETAMINOPHEN 325 MG PO TABS
ORAL_TABLET | ORAL | Status: AC
  Filled 2014-08-06: qty 2

## 2014-08-06 NOTE — Progress Notes (Signed)
Hematology and Oncology Follow Up Visit  LYNCOLN MASKELL [id number] 12/22/1965 48 y.o. 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 3 week     Interim History:  Ms.  Bianca Kent is back for followup unfortunately, she is progressing. We did another PET scan on her.. She has new liver metastases she has new bony lesions. She will start radiation therapy tomorrow. This for the right supraclavicular lymph nodes and right axillary lymph nodes. In addition, some of a right breast mass will be included.  She is having more pain. We are increasing her fentanyl patch.  She is not really able to work. Although she has tried, she's just too weak. Her pain is bothering her. 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 It is hard for her to bend over. She not pick up anything.  I told her that she can certainly go out on total disability we will have to write a letter for her fill out the forms.  She's had a decreased appetite. She's had some constipation. She's had a lot of nausea. She's lost some weight.  I will try her on a patch to help with her nausea.  Currently, her performance status is ECOG 2  She is trying her best. Unfortunately, the PET scan shows that she definitely is progressing.  We need to start her on chemotherapy. I feel that Steward Drone will be appropriate for her. I don't and she will lose her hair. I think that we should can have over a 50% response rate.  Currently, she is on Aromasin. I told her to stop this.           Medications: Current outpatient prescriptions:albuterol (PROVENTIL HFA;VENTOLIN HFA) 108 (90 BASE) MCG/ACT inhaler, Inhale 2 puffs into the lungs every 4 (four) hours as needed for wheezing or shortness of breath., Disp: 2 Inhaler, Rfl: 6;  ALPRAZolam (XANAX) 1 MG tablet, Take 1 tablet (1 mg total) by  mouth at bedtime as needed for anxiety., Disp: 30 tablet, Rfl: 2;  aspirin (ASPIRIN EC) 81 MG EC tablet, Take 81 mg by mouth daily. Swallow whole., Disp: , Rfl:  Bismuth Subsalicylate (PEPTO-BISMOL PO), Take by mouth as needed., Disp: , Rfl: ;  calcium carbonate (TUMS - DOSED IN MG ELEMENTAL CALCIUM) 500 MG chewable tablet, Chew 2 tablets by mouth 2 (two) times daily., Disp: , Rfl: ;  Cholecalciferol (VITAMIN D) 2000 UNITS tablet, Take 2,000 Units by mouth daily., Disp: , Rfl:  diphenhydrAMINE (SOMINEX) 25 MG tablet, Take 100 mg by mouth at bedtime as needed for sleep. Pt states she is taking up to 200 mg Benadryl at night and still not sleeping, Disp: , Rfl: ;  exemestane (AROMASIN) 25 MG tablet, Take 1 tablet (25 mg total) by mouth daily after breakfast., Disp: 30 tablet, Rfl: 6;  fentaNYL (DURAGESIC) 50 MCG/HR, Place 1 patch (50 mcg total) onto the skin every 3 (three) days., Disp: 10 patch, Rfl: 0 HYDROmorphone (DILAUDID) 4 MG tablet, Take 1/2 - 1 pill, IF NEEDED, for pain every 4 hrs, Disp: 90 tablet, Rfl: 0;  lidocaine-prilocaine (EMLA) cream, Apply 1 application topically as needed. Placeonto port site 26min prior to treatment and cover with plastic wrap., Disp: 30 g, Rfl: 2;  loratadine-pseudoephedrine (CLARITIN-D 24-HOUR) 10-240 MG per 24 hr tablet, Take 1 tablet by mouth daily., Disp: , Rfl:  Multiple Vitamin (MULTIVITAMIN) tablet, Take 1 tablet by mouth daily., Disp: , Rfl: ;  naproxen sodium (ANAPROX) 220 MG tablet, Take 220 mg by mouth 2 (two) times daily with a meal., Disp: , Rfl: ;  ondansetron (ZOFRAN) 8 MG tablet, Take 1 tablet (8 mg total) by mouth every 8 (eight) hours as needed for nausea or vomiting., Disp: 20 tablet, Rfl: 2 pantoprazole (PROTONIX) 40 MG tablet, Take 1 tablet (40 mg total) by mouth daily., Disp: 30 tablet, Rfl: 6;  prochlorperazine (COMPAZINE) 10 MG tablet, Take 1 tablet (10 mg total) by mouth every 6 (six) hours as needed for nausea or vomiting., Disp: 60 tablet, Rfl: 2;   promethazine (PHENERGAN) 25 MG suppository, Place 1 suppository (25 mg total) rectally every 6 (six) hours as needed for nausea or vomiting., Disp: 30 each, Rfl: 3 traZODone (DESYREL) 50 MG tablet, Take 1-2 if needed at bed for sleep., Disp: 60 tablet, Rfl: 3;  granisetron (SANCUSO) 3.1 MG/24HR, APPLY TO THE SKIN EVERY 7 DAYS., Disp: 4 each, Rfl: 4 No current facility-administered medications for this visit. Facility-Administered Medications Ordered in Other Visits: ado-trastuzumab emtansine (KADCYLA) 320 mg in sodium chloride 0.9 % 250 mL chemo infusion, 3.6 mg/kg (Treatment Plan Actual), Intravenous, Once, [name], MD, Last Rate: 177 mL/hr at 08/06/14 1214, 320 mg at 08/06/14 1214;  heparin lock flush 100 unit/mL, 500 Units, Intracatheter, Once PRN, [name], MD sodium chloride 0.9 % injection 10 mL, 10 mL, Intracatheter, PRN, [name], MD  Allergies:  Allergies  Allergen Reactions  . Iohexol      Code: RASH, Desc: VERY STRONG FAMILY HX OF ANGIOEDEMA WHEN RECEIVING IV CONTRAST; PT HAS BEEN PREMEDICATED FOR OTHER CONTRASTED STUDIES(IN CATH. LAB)  KR, Onset Date: [id number]   . Prednisone Itching    Capillary beds bust  . Tetanus Toxoids Other (See Comments)    Ran a high fever for 48 hours  . Theophyllines Hives    Mental changes  . Versed [Midazolam] Other (See Comments)    Pt becomes violent    Past Medical History, Surgical history, Social history, and Family History were reviewed and updated.  Review of Systems: As above  Physical Exam:  height is 5\' 6"  (1.676 m) and weight is 196 lb (88.905 kg). Her oral temperature is 98.2 F (36.8 C). Her blood pressure is 156/99 and her pulse is 82. Her respiration is 16.   Well-developed and well-nourished white female. Head and exam shows fullness in the right supraclavicular region. There is tenderness in this region. She has right axillary adenopathy. She has decreased range of motion of the right shoulder. Left  subclavicular region is okay. No masses are noted. Lungs are clear. Cardiac exam regular in rhythm. Abdomen soft. She has good bowel sounds. There is no palpable liver or spleen tip. Back exam shows tenderness over the lumbar and thoracic spine to palpation. Extremities shows no clubbing, cyanosis or edema. She has 4/5 strength in her legs. Skin exam no rashes, ecchymosis or petechia.  Lab Results  Component Value Date   WBC 7.9 08/06/2014   HGB 16.3* 08/06/2014   HCT 46.5 08/06/2014   MCV 87 08/06/2014   PLT 289 08/06/2014     Chemistry      Component Value Date/Time   NA 140 08/06/2014 0927   NA 136 03/24/2014 1147   K 3.5 08/06/2014 0927   K 4.2 03/24/2014 1147   CL 102 08/06/2014 0927   CL 105 03/24/2014 1147  CO2 23 08/06/2014 0927   CO2 21 03/24/2014 1147   BUN 12 08/06/2014 0927   BUN 15 03/24/2014 1147   CREATININE 0.7 08/06/2014 0927   CREATININE 0.83 03/24/2014 1147      Component Value Date/Time   CALCIUM 9.2 08/06/2014 0927   CALCIUM 8.8 03/24/2014 1147   ALKPHOS 92* 08/06/2014 0927   ALKPHOS 106 03/24/2014 1147   AST 32 08/06/2014 0927   AST 19 03/24/2014 1147   ALT 59* 08/06/2014 0927   ALT 28 03/24/2014 1147   BILITOT 0.40 08/06/2014 0927   BILITOT 0.5 03/24/2014 1147         Impression and Plan: Ms. Rasp is 48 year old white female with triple positive metastatic breast cancer. She's been on Aromasin.  With the PET scan, we have to change treatments.  I talked to her and her sister for about 45 minutes. I explained to them what was going on. I gave him the PET scan results. I explained that we needed to be more aggressive with the her to therapy. This is why we need to switch to Kadcyla I think Kadcyla will help her. I think that she will tolerate this well. I don't think she'll lose her hair.  She understands what we have to make a change. I answered all their questions.  I probably would do 3  cycles of Kadcyla I will and then repeat her PET  scan.   [name], MD 10/15/20151:32 PM

## 2014-08-06 NOTE — Patient Instructions (Signed)
Zoledronic Acid injection (Hypercalcemia, Oncology) What is this medicine? ZOLEDRONIC ACID (ZOE le dron ik AS id) lowers the amount of calcium loss from bone. It is used to treat too much calcium in your blood from cancer. It is also used to prevent complications of cancer that has spread to the bone. This medicine may be used for other purposes; ask your health care provider or pharmacist if you have questions. COMMON BRAND NAME(S): Zometa What should I tell my health care provider before I take this medicine? They need to know if you have any of these conditions: -aspirin-sensitive asthma -cancer, especially if you are receiving medicines used to treat cancer -dental disease or wear dentures -infection -kidney disease -receiving corticosteroids like dexamethasone or prednisone -an unusual or allergic reaction to zoledronic acid, other medicines, foods, dyes, or preservatives -pregnant or trying to get pregnant -breast-feeding How should I use this medicine? This medicine is for infusion into a vein. It is given by a health care professional in a hospital or clinic setting. Talk to your pediatrician regarding the use of this medicine in children. Special care may be needed. Overdosage: If you think you have taken too much of this medicine contact a poison control center or emergency room at once. NOTE: This medicine is only for you. Do not share this medicine with others. What if I miss a dose? It is important not to miss your dose. Call your doctor or health care professional if you are unable to keep an appointment. What may interact with this medicine? -certain antibiotics given by injection -NSAIDs, medicines for pain and inflammation, like ibuprofen or naproxen -some diuretics like bumetanide, furosemide -teriparatide -thalidomide This list may not describe all possible interactions. Give your health care provider a list of all the medicines, herbs, non-prescription drugs, or  dietary supplements you use. Also tell them if you smoke, drink alcohol, or use illegal drugs. Some items may interact with your medicine. What should I watch for while using this medicine? Visit your doctor or health care professional for regular checkups. It may be some time before you see the benefit from this medicine. Do not stop taking your medicine unless your doctor tells you to. Your doctor may order blood tests or other tests to see how you are doing. Women should inform their doctor if they wish to become pregnant or think they might be pregnant. There is a potential for serious side effects to an unborn child. Talk to your health care professional or pharmacist for more information. You should make sure that you get enough calcium and vitamin D while you are taking this medicine. Discuss the foods you eat and the vitamins you take with your health care professional. Some people who take this medicine have severe bone, joint, and/or muscle pain. This medicine may also increase your risk for jaw problems or a broken thigh bone. Tell your doctor right away if you have severe pain in your jaw, bones, joints, or muscles. Tell your doctor if you have any pain that does not go away or that gets worse. Tell your dentist and dental surgeon that you are taking this medicine. You should not have major dental surgery while on this medicine. See your dentist to have a dental exam and fix any dental problems before starting this medicine. Take good care of your teeth while on this medicine. Make sure you see your dentist for regular follow-up appointments. What side effects may I notice from receiving this medicine? Side effects that   you should report to your doctor or health care professional as soon as possible: -allergic reactions like skin rash, itching or hives, swelling of the face, lips, or tongue -anxiety, confusion, or depression -breathing problems -changes in vision -eye pain -feeling faint or  lightheaded, falls -jaw pain, especially after dental work -mouth sores -muscle cramps, stiffness, or weakness -trouble passing urine or change in the amount of urine Side effects that usually do not require medical attention (report to your doctor or health care professional if they continue or are bothersome): -bone, joint, or muscle pain -constipation -diarrhea -fever -hair loss -irritation at site where injected -loss of appetite -nausea, vomiting -stomach upset -trouble sleeping -trouble swallowing -weak or tired This list may not describe all possible side effects. Call your doctor for medical advice about side effects. You may report side effects to FDA at 1-800-FDA-1088. Where should I keep my medicine? This drug is given in a hospital or clinic and will not be stored at home. NOTE: This sheet is a summary. It may not cover all possible information. If you have questions about this medicine, talk to your doctor, pharmacist, or health care provider.  2015, Elsevier/Gold Standard. (2013-03-20 13:03:13) Ado-Trastuzumab Emtansine for injection What is this medicine? Ado-trastuzumab emtansine (ADD oh traz TOO zuh mab em TAN zine) is a monoclonal antibody combined with chemotherapy. It targets a protein called HER2. This protein is found in some stomach and breast cancers. This medicine can stop cancer cell growth. This medicine may be used for other purposes; ask your health care provider or pharmacist if you have questions. COMMON BRAND NAME(S): Kadcyla What should I tell my health care provider before I take this medicine? They need to know if you have any of these conditions: -heart disease -heart failure -infection (especially a virus infection such as chickenpox, cold sores, or herpes) -liver disease -lung or breathing disease, like asthma -an unusual or allergic reaction to ado-trastuzumab emtansine, other medications, foods, dyes, or preservatives -pregnant or trying to  get pregnant -breast-feeding How should I use this medicine? This medicine is for infusion into a vein. It is given by a health care professional in a hospital or clinic setting. Talk to your pediatrician regarding the use of this medicine in children. Special care may be needed. Overdosage: If you think you've taken too much of this medicine contact a poison control center or emergency room at once. Overdosage: If you think you have taken too much of this medicine contact a poison control center or emergency room at once. NOTE: This medicine is only for you. Do not share this medicine with others. What if I miss a dose? It is important not to miss your dose. Call your doctor or health care professional if you are unable to keep an appointment. What may interact with this medicine? This medicine may also interact with the following medications: -atazanavir -boceprevir -clarithromycin -delavirdine -indinavir -dalfopristin; quinupristin -isoniazid, INH -itraconazole -ketoconazole -nefazodone -nelfinavir -ritonavir -telaprevir -telithromycin -tipranavir -voriconazole This list may not describe all possible interactions. Give your health care provider a list of all the medicines, herbs, non-prescription drugs, or dietary supplements you use. Also tell them if you smoke, drink alcohol, or use illegal drugs. Some items may interact with your medicine. What should I watch for while using this medicine? Visit your doctor for checks on your progress. This drug may make you feel generally unwell. This is not uncommon, as chemotherapy can affect healthy cells as well as cancer cells. Report any side  effects. Continue your course of treatment even though you feel ill unless your doctor tells you to stop. Call your doctor or health care professional for advice if you get a fever, chills or sore throat, or other symptoms of a cold or flu. Do not treat yourself. This drug decreases your body's  ability to fight infections. Try to avoid being around people who are sick. Be careful brushing and flossing your teeth or using a toothpick because you may get an infection or bleed more easily. If you have any dental work done, tell your dentist you are receiving this medicine. Avoid taking products that contain aspirin, acetaminophen, ibuprofen, naproxen, or ketoprofen unless instructed by your doctor. These medicines may hide a fever. Do not become pregnant while taking this medicine. Women should inform their doctor if they wish to become pregnant or think they might be pregnant. There is a potential for serious side effects to an unborn child. [Men should inform their doctors if they wish to father a child. This medicine may lower sperm counts.] Talk to your health care professional or pharmacist for more information. Do not breast-feed an infant while taking this medicine. You may need blood work done while you are taking this medicine. What side effects may I notice from receiving this medicine? Side effects that you should report to your doctor or health care professional as soon as possible: -allergic reactions like skin rash, itching or hives, swelling of the face, lips, or tongue -breathing problems -chest pain or palpitations -fever or chills, sore throat -general ill feeling or flu-like symptoms -light-colored stools -nausea, vomiting -pain, tingling, numbness in the hands or feet -signs and symptoms of bleeding such as bloody or black, tarry stools; red or dark-brown urine; spitting up blood or brown material that looks like coffee grounds; red spots on the skin; unusual bruising or bleeding from the eye, gums, or nose -swelling of the legs or ankles -yellowing of the eyes or skin Side effects that usually do not require medical attention (Report these to your doctor or health care professional if they continue or are bothersome.): -changes in  taste -constipation -dizziness -headache -joint pain -muscle pain -trouble sleeping -unusually weak or tired This list may not describe all possible side effects. Call your doctor for medical advice about side effects. You may report side effects to FDA at 1-800-FDA-1088. Where should I keep my medicine? This drug is given in a hospital or clinic and will not be stored at home. NOTE: This sheet is a summary. It may not cover all possible information. If you have questions about this medicine, talk to your doctor, pharmacist, or health care provider.  2015, Elsevier/Gold Standard. (2013-02-18 12:55:10)

## 2014-08-07 ENCOUNTER — Inpatient Hospital Stay
Admission: RE | Admit: 2014-08-07 | Discharge: 2014-08-07 | Disposition: A | Discharge status: Home / Self Care | Payer: 59 | Source: Ambulatory Visit | Attending: Radiation Oncology | Admitting: Radiation Oncology

## 2014-08-07 ENCOUNTER — Ambulatory Visit
Admission: RE | Admit: 2014-08-07 | Discharge: 2014-08-07 | Disposition: A | Discharge status: Home / Self Care | Payer: 59 | Source: Ambulatory Visit | Attending: Radiation Oncology | Admitting: Radiation Oncology

## 2014-08-07 DIAGNOSIS — C50911 Malignant neoplasm of unspecified site of right female breast: Secondary | ICD-10-CM

## 2014-08-07 DIAGNOSIS — Z510 Encounter for antineoplastic radiation therapy: Secondary | ICD-10-CM | POA: Diagnosis not present

## 2014-08-07 MED ORDER — RADIAPLEXRX EX GEL
Freq: Once | CUTANEOUS | Status: AC
  Administered 2014-08-07: 17:00:00 via TOPICAL

## 2014-08-07 MED ORDER — ALRA NON-METALLIC DEODORANT (RAD-ONC)
1.0000 "application " | Freq: Once | TOPICAL | Status: AC
  Administered 2014-08-07: 1 via TOPICAL

## 2014-08-07 NOTE — Progress Notes (Signed)
Pt here for [facility] [employer].  Radiation and You booklet, Alra and Radiaplex given.  Discussed possible side effects such as, skin changes, fatigue, hair loss, breast tenderness/swelling.  Reviewed pertinent information and flagged areas.  Instructed on proper use of Radiaplex, Alra and to avoid applying any product to treatment field within four hrs of radiation treatment.  Instructed to avoid shaving the underarm area, scented or irritating products.  Pt able to give teach of how often to apply Radiaplex (bid) and to keep skin free of product with in 4 hrs of treatment and to avoid using irritating products.  Pt states she feels comfortable and at ease, she states she will contact staff if she has any further concerns or questions.

## 2014-08-10 ENCOUNTER — Ambulatory Visit
Admission: RE | Admit: 2014-08-10 | Discharge: 2014-08-10 | Disposition: A | Discharge status: Home / Self Care | Payer: 59 | Source: Ambulatory Visit | Attending: Radiation Oncology | Admitting: Radiation Oncology

## 2014-08-10 DIAGNOSIS — Z510 Encounter for antineoplastic radiation therapy: Secondary | ICD-10-CM | POA: Diagnosis not present

## 2014-08-11 ENCOUNTER — Ambulatory Visit
Admission: RE | Admit: 2014-08-11 | Discharge: 2014-08-11 | Disposition: A | Discharge status: Home / Self Care | Payer: 59 | Source: Ambulatory Visit | Attending: Radiation Oncology | Admitting: Radiation Oncology

## 2014-08-11 ENCOUNTER — Encounter: Payer: Self-pay | Admitting: Radiation Oncology

## 2014-08-11 VITALS — BP 137/95 | HR 89 | Temp 98.3°F | Ht 66.0 in | Wt 193.6 lb

## 2014-08-11 DIAGNOSIS — C50911 Malignant neoplasm of unspecified site of right female breast: Secondary | ICD-10-CM

## 2014-08-11 DIAGNOSIS — Z510 Encounter for antineoplastic radiation therapy: Secondary | ICD-10-CM | POA: Diagnosis not present

## 2014-08-11 NOTE — Progress Notes (Signed)
  Radiation Oncology         [phone] ________________________________  Name: Bianca Kent MRN: [id number]  Date: 08/11/2014  DOB: July 08, 1966  Weekly Radiation Therapy Management  DIAGNOSIS: Stage IV infiltrating ductal carcinoma the right breast (triple positive)   Current Dose: 7.2 Gy     Planned Dose:  45+ Gy  Narrative . . . . . . . . The patient presents for routine under treatment assessment.                                   The patient is without complaint for stinging sensation along the right axillary region                                 Set-up films were reviewed.                                 The chart was checked. Physical Findings. . .  height is 5\' 6"  (1.676 m) and weight is 193 lb 9.6 oz (87.816 kg). Her temperature is 98.3 F (36.8 C). Her blood pressure is 137/95 and her pulse is 89. . No significant radiation reaction noted in the treatment area within the right breast and axillary areas. Impression . . . . . . . The patient is tolerating radiation. Plan . . . . . . . . . . . . Continue treatment as planned.  ________________________________   Blair Promise, PhD, MD

## 2014-08-11 NOTE — Progress Notes (Signed)
Bianca Kent has received 5 fractions to her right breast.  She reports "stinging" sensation in her right axilla as a level 3/10.  No other voiced concerns

## 2014-08-12 ENCOUNTER — Ambulatory Visit
Admission: RE | Admit: 2014-08-12 | Discharge: 2014-08-12 | Disposition: A | Discharge status: Home / Self Care | Payer: 59 | Source: Ambulatory Visit | Attending: Radiation Oncology | Admitting: Radiation Oncology

## 2014-08-12 DIAGNOSIS — Z510 Encounter for antineoplastic radiation therapy: Secondary | ICD-10-CM | POA: Diagnosis not present

## 2014-08-13 ENCOUNTER — Ambulatory Visit
Admission: RE | Admit: 2014-08-13 | Discharge: 2014-08-13 | Disposition: A | Discharge status: Home / Self Care | Payer: 59 | Source: Ambulatory Visit | Attending: Radiation Oncology | Admitting: Radiation Oncology

## 2014-08-13 DIAGNOSIS — Z510 Encounter for antineoplastic radiation therapy: Secondary | ICD-10-CM | POA: Diagnosis not present

## 2014-08-14 ENCOUNTER — Ambulatory Visit
Admission: RE | Admit: 2014-08-14 | Discharge: 2014-08-14 | Disposition: A | Discharge status: Home / Self Care | Payer: 59 | Source: Ambulatory Visit | Attending: Radiation Oncology | Admitting: Radiation Oncology

## 2014-08-14 DIAGNOSIS — Z510 Encounter for antineoplastic radiation therapy: Secondary | ICD-10-CM | POA: Diagnosis not present

## 2014-08-17 ENCOUNTER — Ambulatory Visit
Admission: RE | Admit: 2014-08-17 | Discharge: 2014-08-17 | Disposition: A | Discharge status: Home / Self Care | Payer: 59 | Source: Ambulatory Visit | Attending: Radiation Oncology | Admitting: Radiation Oncology

## 2014-08-17 DIAGNOSIS — Z510 Encounter for antineoplastic radiation therapy: Secondary | ICD-10-CM | POA: Diagnosis not present

## 2014-08-18 ENCOUNTER — Inpatient Hospital Stay
Admission: RE | Admit: 2014-08-18 | Discharge: 2014-08-18 | Disposition: A | Discharge status: Home / Self Care | Payer: Self-pay | Source: Ambulatory Visit | Attending: Radiation Oncology | Admitting: Radiation Oncology

## 2014-08-18 ENCOUNTER — Encounter: Payer: Self-pay | Admitting: Radiation Oncology

## 2014-08-18 ENCOUNTER — Ambulatory Visit
Admission: RE | Admit: 2014-08-18 | Discharge: 2014-08-18 | Disposition: A | Discharge status: Home / Self Care | Payer: 59 | Source: Ambulatory Visit | Attending: Radiation Oncology | Admitting: Radiation Oncology

## 2014-08-18 VITALS — BP 120/76 | HR 74 | Temp 97.8°F | Resp 16 | Ht 66.0 in | Wt 191.4 lb

## 2014-08-18 DIAGNOSIS — C50911 Malignant neoplasm of unspecified site of right female breast: Secondary | ICD-10-CM

## 2014-08-18 DIAGNOSIS — Z510 Encounter for antineoplastic radiation therapy: Secondary | ICD-10-CM | POA: Diagnosis not present

## 2014-08-18 NOTE — Progress Notes (Signed)
  Radiation Oncology         [phone] ________________________________  Name: ELENNA SPRATLING MRN: [id number]  Date: 08/18/2014  DOB: [birth year]  Weekly Radiation Therapy Management  DIAGNOSIS: Stage IV infiltrating ductal carcinoma the right breast (triple positive)   Current Dose: 16.2 Gy     Planned Dose:  45+ Gy  Narrative . . . . . . . . The patient presents for routine under treatment assessment.                                   The patient is without complaint. No itching fatigue or discomfort in the treatment area at this point                                 Set-up films were reviewed.                                 The chart was checked. Physical Findings. . .  height is 5\' 6"  (1.676 m) and weight is 191 lb 6.4 oz (86.818 kg). Her oral temperature is 97.8 F (36.6 C). Her blood pressure is 120/76 and her pulse is 74. Her respiration is 16. . The lungs are clear. The heart has a regular rhythm and rate. Examination of the right breast and supraclavicular region reveals some mild erythema but no significant skin reaction at this point. Impression . . . . . . . The patient is tolerating radiation. Plan . . . . . . . . . . . . Continue treatment as planned.  ________________________________   Blair Promise, PhD, MD

## 2014-08-18 NOTE — Progress Notes (Signed)
Weekly rad txs right breast 9/25 completed, no skin changes on breast, under inframmary fold some erythema, snd red spots, patient says its been like that before I started treatment", nausea at times from Chemotherapy and talkes compazine,zofran and patches to help stated, appetite fair, energy level down, no pain in breast 9:35 AM

## 2014-08-19 ENCOUNTER — Ambulatory Visit
Admission: RE | Admit: 2014-08-19 | Discharge: 2014-08-19 | Disposition: A | Discharge status: Home / Self Care | Payer: 59 | Source: Ambulatory Visit | Attending: Radiation Oncology | Admitting: Radiation Oncology

## 2014-08-19 DIAGNOSIS — Z510 Encounter for antineoplastic radiation therapy: Secondary | ICD-10-CM | POA: Diagnosis not present

## 2014-08-20 ENCOUNTER — Ambulatory Visit
Admission: RE | Admit: 2014-08-20 | Discharge: 2014-08-20 | Disposition: A | Discharge status: Home / Self Care | Payer: 59 | Source: Ambulatory Visit | Attending: Radiation Oncology | Admitting: Radiation Oncology

## 2014-08-20 DIAGNOSIS — Z510 Encounter for antineoplastic radiation therapy: Secondary | ICD-10-CM | POA: Diagnosis not present

## 2014-08-21 ENCOUNTER — Ambulatory Visit
Admission: RE | Admit: 2014-08-21 | Discharge: 2014-08-21 | Disposition: A | Discharge status: Home / Self Care | Payer: 59 | Source: Ambulatory Visit | Attending: Radiation Oncology | Admitting: Radiation Oncology

## 2014-08-21 DIAGNOSIS — Z510 Encounter for antineoplastic radiation therapy: Secondary | ICD-10-CM | POA: Diagnosis not present

## 2014-08-24 ENCOUNTER — Ambulatory Visit
Admission: RE | Admit: 2014-08-24 | Discharge: 2014-08-24 | Disposition: A | Discharge status: Home / Self Care | Payer: 59 | Source: Ambulatory Visit | Attending: Radiation Oncology | Admitting: Radiation Oncology

## 2014-08-24 DIAGNOSIS — Z510 Encounter for antineoplastic radiation therapy: Secondary | ICD-10-CM | POA: Diagnosis not present

## 2014-08-25 ENCOUNTER — Encounter: Payer: Self-pay | Admitting: Radiation Oncology

## 2014-08-25 ENCOUNTER — Ambulatory Visit
Admission: RE | Admit: 2014-08-25 | Discharge: 2014-08-25 | Disposition: A | Discharge status: Home / Self Care | Payer: 59 | Source: Ambulatory Visit | Attending: Radiation Oncology | Admitting: Radiation Oncology

## 2014-08-25 VITALS — BP 130/76 | HR 74 | Temp 98.2°F | Resp 16 | Ht 66.0 in | Wt 194.5 lb

## 2014-08-25 DIAGNOSIS — C50911 Malignant neoplasm of unspecified site of right female breast: Secondary | ICD-10-CM

## 2014-08-25 DIAGNOSIS — Z510 Encounter for antineoplastic radiation therapy: Secondary | ICD-10-CM | POA: Diagnosis not present

## 2014-08-25 MED ORDER — BIAFINE EX EMUL
Freq: Two times a day (BID) | CUTANEOUS | Status: DC
  Administered 2014-08-25: 10:00:00 via TOPICAL

## 2014-08-25 NOTE — Progress Notes (Signed)
Biafine given to patient as requested per Dr. Sondra Come.  Instructed to apply BID to the right breast inclusive of her upper chest and right shoulder blade.  She stated understanding.

## 2014-08-25 NOTE — Progress Notes (Signed)
  Radiation Oncology         [phone] ________________________________  Name: Bianca Kent MRN: [id number]  Date: 08/25/2014  DOB: 08/27/1966  Weekly Radiation Therapy Management  DIAGNOSIS: Stage IV infiltrating ductal carcinoma the right breast (triple positive)  Current Dose: 25.2 Gy     Planned Dose:  45 + Gy  Narrative . . . . . . . . The patient presents for routine under treatment assessment.                                   The patient is without complaint except for some itching in the breast area. The patient will be switched to Biafine.  She has noticed improvement in her pain in the right breast and axillary region                                 Set-up films were reviewed.                                 The chart was checked. Physical Findings. . .  height is 5\' 6"  (1.676 m) and weight is 194 lb 8 oz (88.225 kg). Her oral temperature is 98.2 F (36.8 C). Her blood pressure is 130/76 and her pulse is 74. Her respiration is 16. . The lungs are clear. The heart has a regular rhythm and rate. The right breast area shows some erythema and hyperpigmentation changes but no skin breakdown. The lymphadenopathy in the right axillary area does palpate smaller on examination today. Impression . . . . . . . The patient is tolerating radiation. Plan . . . . . . . . . . . . Continue treatment as planned.  ________________________________   Blair Promise, PhD, MD

## 2014-08-25 NOTE — Progress Notes (Signed)
Bianca Kent has completed 14 fractions to her right breast.  She reports that her pain is improving in her right breast.  Today she rates it at a 3/10.  She continues to use a fentanyl patch and dilaudid tablets prn.  She reports fatigue today but is working full time.  The skin on her right breast is red.  She is using radiaplex twice a day.

## 2014-08-26 ENCOUNTER — Ambulatory Visit
Admission: RE | Admit: 2014-08-26 | Discharge: 2014-08-26 | Disposition: A | Discharge status: Home / Self Care | Payer: 59 | Source: Ambulatory Visit | Attending: Radiation Oncology | Admitting: Radiation Oncology

## 2014-08-26 DIAGNOSIS — Z510 Encounter for antineoplastic radiation therapy: Secondary | ICD-10-CM | POA: Diagnosis not present

## 2014-08-27 ENCOUNTER — Ambulatory Visit (HOSPITAL_BASED_OUTPATIENT_CLINIC_OR_DEPARTMENT_OTHER): Payer: 59 | Admitting: Hematology & Oncology

## 2014-08-27 ENCOUNTER — Ambulatory Visit
Admission: RE | Admit: 2014-08-27 | Discharge: 2014-08-27 | Disposition: A | Discharge status: Home / Self Care | Payer: 59 | Source: Ambulatory Visit | Attending: Radiation Oncology | Admitting: Radiation Oncology

## 2014-08-27 ENCOUNTER — Encounter: Payer: Self-pay | Admitting: Hematology & Oncology

## 2014-08-27 ENCOUNTER — Ambulatory Visit (HOSPITAL_BASED_OUTPATIENT_CLINIC_OR_DEPARTMENT_OTHER): Payer: 59

## 2014-08-27 ENCOUNTER — Other Ambulatory Visit (HOSPITAL_BASED_OUTPATIENT_CLINIC_OR_DEPARTMENT_OTHER): Payer: 59 | Admitting: Lab

## 2014-08-27 VITALS — BP 143/96 | HR 85 | Temp 98.1°F | Resp 14 | Ht 66.0 in | Wt 188.0 lb

## 2014-08-27 DIAGNOSIS — Z510 Encounter for antineoplastic radiation therapy: Secondary | ICD-10-CM | POA: Diagnosis not present

## 2014-08-27 DIAGNOSIS — C50811 Malignant neoplasm of overlapping sites of right female breast: Secondary | ICD-10-CM

## 2014-08-27 DIAGNOSIS — C7989 Secondary malignant neoplasm of other specified sites: Secondary | ICD-10-CM

## 2014-08-27 DIAGNOSIS — R112 Nausea with vomiting, unspecified: Secondary | ICD-10-CM

## 2014-08-27 DIAGNOSIS — Z170 Estrogen receptor positive status [ER+]: Secondary | ICD-10-CM

## 2014-08-27 DIAGNOSIS — C50911 Malignant neoplasm of unspecified site of right female breast: Secondary | ICD-10-CM

## 2014-08-27 DIAGNOSIS — R5383 Other fatigue: Secondary | ICD-10-CM

## 2014-08-27 DIAGNOSIS — Z5112 Encounter for antineoplastic immunotherapy: Secondary | ICD-10-CM

## 2014-08-27 LAB — CBC WITH DIFFERENTIAL (CANCER CENTER ONLY)
BASO#: 0 10*3/uL (ref 0.0–0.2)
BASO%: 0.6 % (ref 0.0–2.0)
EOS ABS: 0 10*3/uL (ref 0.0–0.5)
EOS%: 0.8 % (ref 0.0–7.0)
HCT: 48.2 % — ABNORMAL HIGH (ref 34.8–46.6)
HEMOGLOBIN: 16.6 g/dL — AB (ref 11.6–15.9)
LYMPH#: 1 10*3/uL (ref 0.9–3.3)
LYMPH%: 18.5 % (ref 14.0–48.0)
MCH: 29.6 pg (ref 26.0–34.0)
MCHC: 34.4 g/dL (ref 32.0–36.0)
MCV: 86 fL (ref 81–101)
MONO#: 0.5 10*3/uL (ref 0.1–0.9)
MONO%: 10.4 % (ref 0.0–13.0)
NEUT#: 3.6 10*3/uL (ref 1.5–6.5)
NEUT%: 69.7 % (ref 39.6–80.0)
Platelets: 188 10*3/uL (ref 145–400)
RBC: 5.61 10*6/uL — ABNORMAL HIGH (ref 3.70–5.32)
RDW: 13.5 % (ref 11.1–15.7)
WBC: 5.2 10*3/uL (ref 3.9–10.0)

## 2014-08-27 LAB — CMP (CANCER CENTER ONLY)
ALBUMIN: 3.9 g/dL (ref 3.3–5.5)
ALT(SGPT): 37 U/L (ref 10–47)
AST: 37 U/L (ref 11–38)
Alkaline Phosphatase: 103 U/L — ABNORMAL HIGH (ref 26–84)
BUN: 9 mg/dL (ref 7–22)
CALCIUM: 9.2 mg/dL (ref 8.0–10.3)
CHLORIDE: 101 meq/L (ref 98–108)
CO2: 25 meq/L (ref 18–33)
Creat: 0.7 mg/dl (ref 0.6–1.2)
Glucose, Bld: 96 mg/dL (ref 73–118)
Potassium: 3.6 mEq/L (ref 3.3–4.7)
Sodium: 142 mEq/L (ref 128–145)
Total Bilirubin: 0.6 mg/dl (ref 0.20–1.60)
Total Protein: 8.1 g/dL (ref 6.4–8.1)

## 2014-08-27 MED ORDER — PALONOSETRON HCL INJECTION 0.25 MG/5ML
0.2500 mg | Freq: Once | INTRAVENOUS | Status: AC
  Administered 2014-08-27: 0.25 mg via INTRAVENOUS

## 2014-08-27 MED ORDER — HEPARIN SOD (PORK) LOCK FLUSH 100 UNIT/ML IV SOLN
500.0000 [IU] | Freq: Once | INTRAVENOUS | Status: AC | PRN
  Administered 2014-08-27: 500 [IU]
  Filled 2014-08-27: qty 5

## 2014-08-27 MED ORDER — DRONABINOL 5 MG PO CAPS: 5.0000 mg | ORAL_CAPSULE | Freq: Two times a day (BID) | ORAL | Status: DC

## 2014-08-27 MED ORDER — SODIUM CHLORIDE 0.9 % IJ SOLN
10.0000 mL | INTRAMUSCULAR | Status: DC | PRN
  Administered 2014-08-27: 10 mL
  Filled 2014-08-27: qty 10

## 2014-08-27 MED ORDER — PALONOSETRON HCL INJECTION 0.25 MG/5ML
INTRAVENOUS | Status: AC
  Filled 2014-08-27: qty 5

## 2014-08-27 MED ORDER — ACETAMINOPHEN 325 MG PO TABS
650.0000 mg | ORAL_TABLET | Freq: Once | ORAL | Status: AC
Start: 2014-08-27 — End: 2014-08-27
  Administered 2014-08-27: 650 mg via ORAL

## 2014-08-27 MED ORDER — ACETAMINOPHEN 325 MG PO TABS
ORAL_TABLET | ORAL | Status: AC
  Filled 2014-08-27: qty 2

## 2014-08-27 MED ORDER — SODIUM CHLORIDE 0.9 % IV SOLN
420.0000 mg | Freq: Once | INTRAVENOUS | Status: AC
  Administered 2014-08-27: 420 mg via INTRAVENOUS
  Filled 2014-08-27: qty 14

## 2014-08-27 MED ORDER — DIPHENHYDRAMINE HCL 25 MG PO CAPS
ORAL_CAPSULE | ORAL | Status: AC
  Filled 2014-08-27: qty 2

## 2014-08-27 MED ORDER — SODIUM CHLORIDE 0.9 % IV SOLN
3.6000 mg/kg | Freq: Once | INTRAVENOUS | Status: AC
  Administered 2014-08-27: 320 mg via INTRAVENOUS
  Filled 2014-08-27: qty 16

## 2014-08-27 MED ORDER — METHYLPHENIDATE HCL 5 MG PO TABS: 5.0000 mg | ORAL_TABLET | Freq: Two times a day (BID) | ORAL | Status: DC

## 2014-08-27 MED ORDER — DIPHENHYDRAMINE HCL 25 MG PO CAPS
50.0000 mg | ORAL_CAPSULE | Freq: Once | ORAL | Status: AC
  Administered 2014-08-27: 50 mg via ORAL

## 2014-08-27 MED ORDER — ZOLEDRONIC ACID 4 MG/100ML IV SOLN
4.0000 mg | Freq: Once | INTRAVENOUS | Status: AC
  Administered 2014-08-27: 4 mg via INTRAVENOUS
  Filled 2014-08-27: qty 100

## 2014-08-27 MED ORDER — SODIUM CHLORIDE 0.9 % IV SOLN
Freq: Once | INTRAVENOUS | Status: AC
  Administered 2014-08-27: 11:00:00 via INTRAVENOUS

## 2014-08-27 NOTE — Progress Notes (Signed)
Hematology and Oncology Follow Up Visit  Bianca Kent [id number] [birth year] 48 y.o. 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 3 week     Interim History:  Ms.  Witthuhn is back for followup she is feeling better. She still getting radiation therapy. She feels that it is working. She's getting radiation therapy to the right supraclavicular region, axillary and right breast area. She still has a few weeks to go. However, the pain is getting better. We did adjust her pain medication.  She still is quite tired. She does not have a lot of energy. I will try her on some Ritalin. I think 5 mg twice a day would help.  Her appetite is still decreased. There is some constant nausea. As such, I will try her on Marinol. I will try her on 5 mg twice a day.  She's had no bleeding. Her bowels are doing okay. She's had no cough. She's had no headache. There's been no leg swelling. She is still working on [facility] [facility]. I told her that she can take disability if she would like. She really enjoys working and wants to try to help other patients. I applauded her for this. However, I want to make sure that she does not wear herself down.  Currently, her performance status is ECOG 1  Medications: Current outpatient prescriptions: albuterol (PROVENTIL HFA;VENTOLIN HFA) 108 (90 BASE) MCG/ACT inhaler, Inhale 2 puffs into the lungs every 4 (four) hours as needed for wheezing or shortness of breath., Disp: 2 Inhaler, Rfl: 6;  ALPRAZolam (XANAX) 1 MG tablet, Take 1 tablet (1 mg total) by mouth at bedtime as needed for anxiety., Disp: 30 tablet, Rfl: 2;  aspirin (ASPIRIN EC) 81 MG EC tablet, Take 81 mg by mouth daily. Swallow whole., Disp: , Rfl:  Bismuth Subsalicylate (PEPTO-BISMOL PO), Take by mouth as needed., Disp: , Rfl: ;  calcium  carbonate (TUMS - DOSED IN MG ELEMENTAL CALCIUM) 500 MG chewable tablet, Chew 2 tablets by mouth 2 (two) times daily., Disp: , Rfl: ;  Cholecalciferol (VITAMIN D) 2000 UNITS tablet, Take 2,000 Units by mouth daily., Disp: , Rfl:  diphenhydrAMINE (SOMINEX) 25 MG tablet, Take 100 mg by mouth at bedtime as needed for sleep. Pt states she is taking up to 200 mg Benadryl at night and still not sleeping, Disp: , Rfl: ;  fentaNYL (DURAGESIC) 50 MCG/HR, Place 1 patch (50 mcg total) onto the skin every 3 (three) days., Disp: 10 patch, Rfl: 0;  granisetron (SANCUSO) 3.1 MG/24HR, APPLY TO THE SKIN EVERY 7 DAYS., Disp: 4 each, Rfl: 4 HYDROmorphone (DILAUDID) 4 MG tablet, Take 1/2 - 1 pill, IF NEEDED, for pain every 4 hrs, Disp: 90 tablet, Rfl: 0;  lidocaine-prilocaine (EMLA) cream, Apply 1 application topically as needed. Placeonto port site 33min prior to treatment and cover with plastic wrap., Disp: 30 g, Rfl: 2;  loratadine-pseudoephedrine (CLARITIN-D 24-HOUR) 10-240 MG per 24 hr tablet, Take 1 tablet by mouth daily., Disp: , Rfl:  Multiple Vitamin (MULTIVITAMIN) tablet, Take 1 tablet by mouth daily., Disp: , Rfl: ;  naproxen sodium (ANAPROX) 220 MG tablet, Take 220 mg by mouth 2 (two) times daily with a meal., Disp: , Rfl: ;  ondansetron (ZOFRAN) 8 MG tablet, Take 1 tablet (8 mg total) by mouth every 8 (eight)  hours as needed for nausea or vomiting., Disp: 20 tablet, Rfl: 2 pantoprazole (PROTONIX) 40 MG tablet, Take 1 tablet (40 mg total) by mouth daily., Disp: 30 tablet, Rfl: 6;  prochlorperazine (COMPAZINE) 10 MG tablet, Take 1 tablet (10 mg total) by mouth every 6 (six) hours as needed for nausea or vomiting., Disp: 60 tablet, Rfl: 2;  promethazine (PHENERGAN) 25 MG suppository, Place 1 suppository (25 mg total) rectally every 6 (six) hours as needed for nausea or vomiting., Disp: 30 each, Rfl: 3 traZODone (DESYREL) 50 MG tablet, Take 1-2 if needed at bed for sleep., Disp: 60 tablet, Rfl: 3;  dronabinol (MARINOL) 5  MG capsule, Take 1 capsule (5 mg total) by mouth 2 (two) times daily before a meal., Disp: 60 capsule, Rfl: 0;  methylphenidate (RITALIN) 5 MG tablet, Take 1 tablet (5 mg total) by mouth 2 (two) times daily., Disp: 60 tablet, Rfl: 0 No current facility-administered medications for this visit. Facility-Administered Medications Ordered in Other Visits: ado-trastuzumab emtansine (KADCYLA) 320 mg in sodium chloride 0.9 % 250 mL chemo infusion, 3.6 mg/kg (Treatment Plan Actual), Intravenous, Once, [name], MD;  heparin lock flush 100 unit/mL, 500 Units, Intracatheter, Once PRN, [name], MD pertuzumab (PERJETA) 420 mg in sodium chloride 0.9 % 250 mL chemo infusion, 420 mg, Intravenous, Once, [name], MD, Last Rate: 528 mL/hr at 08/27/14 1403, 420 mg at 08/27/14 1403;  sodium chloride 0.9 % injection 10 mL, 10 mL, Intracatheter, PRN, [name], MD  Allergies:  Allergies  Allergen Reactions  . Iohexol      Code: RASH, Desc: VERY STRONG FAMILY HX OF ANGIOEDEMA WHEN RECEIVING IV CONTRAST; PT HAS BEEN PREMEDICATED FOR OTHER CONTRASTED STUDIES(IN CATH. LAB)  KR, Onset Date: [id number]   . Prednisone Itching    Capillary beds bust  . Tetanus Toxoids Other (See Comments)    Ran a high fever for 48 hours  . Theophyllines Hives    Mental changes  . Versed [Midazolam] Other (See Comments)    Pt becomes violent    Past Medical History, Surgical history, Social history, and Family History were reviewed and updated.  Review of Systems: As above  Physical Exam:  height is 5\' 6"  (1.676 m) and weight is 188 lb (85.276 kg). Her oral temperature is 98.1 F (36.7 C). Her blood pressure is 143/96 and her pulse is 85. Her respiration is 14.   Well-developed and well-nourished white female. Head and exam shows decreased fullness in the right supraclavicular region. It is not as tender in this region. She has decreased right axillary adenopathy. She has improved range of motion of the  right shoulder. Left supraclavicular region is okay. No masses are noted. Lungs are clear. Cardiac exam regular irate and rhythm. Abdomen soft. She has good bowel sounds. There is no palpable liver or spleen tip. Back exam shows decreased tenderness over the lumbar and thoracic spine to palpation. Extremities shows no clubbing, cyanosis or edema. She has 4/5 strength in her legs. Skin exam no rashes, ecchymosis or petechia.  Lab Results  Component Value Date   WBC 5.2 08/27/2014   HGB 16.6* 08/27/2014   HCT 48.2* 08/27/2014   MCV 86 08/27/2014   PLT 188 08/27/2014     Chemistry      Component Value Date/Time   NA 142 08/27/2014 1103   NA 136 03/24/2014 1147   K 3.6 08/27/2014 1103   K 4.2 03/24/2014 1147   CL 101 08/27/2014 1103  CL 105 03/24/2014 1147   CO2 25 08/27/2014 1103   CO2 21 03/24/2014 1147   BUN 9 08/27/2014 1103   BUN 15 03/24/2014 1147   CREATININE 0.7 08/27/2014 1103   CREATININE 0.83 03/24/2014 1147      Component Value Date/Time   CALCIUM 9.2 08/27/2014 1103   CALCIUM 8.8 03/24/2014 1147   ALKPHOS 103* 08/27/2014 1103   ALKPHOS 106 03/24/2014 1147   AST 37 08/27/2014 1103   AST 19 03/24/2014 1147   ALT 37 08/27/2014 1103   ALT 28 03/24/2014 1147   BILITOT 0.60 08/27/2014 1103   BILITOT 0.5 03/24/2014 1147         Impression and Plan: Ms. Mauney is 48 year old white female with triple positive metastatic breast cancer. We now have her on Kadcyla. She tolerated her first cycle well.  Clinically, she is doing pretty well. She seems to be responding clinically.  Her weight is down a little bit more. We have to be careful with this.  I want to get her back in 3 more weeks. I probably will do some scans on her once we get done with the radiation.  Her CA 27.29 has never been that elevated so I'm unsure we can use this as a marker for disease response.  I spent about 40 minutes with she and her sister today. I told her again, that we can certainly  fill out disability forms for her if she wants to stop working.  [name], MD 11/5/20152:07 PM

## 2014-08-27 NOTE — Patient Instructions (Signed)
Zoledronic Acid injection (Hypercalcemia, Oncology) What is this medicine? ZOLEDRONIC ACID (ZOE le dron ik AS id) lowers the amount of calcium loss from bone. It is used to treat too much calcium in your blood from cancer. It is also used to prevent complications of cancer that has spread to the bone. This medicine may be used for other purposes; ask your health care provider or pharmacist if you have questions. COMMON BRAND NAME(S): Zometa What should I tell my health care provider before I take this medicine? They need to know if you have any of these conditions: -aspirin-sensitive asthma -cancer, especially if you are receiving medicines used to treat cancer -dental disease or wear dentures -infection -kidney disease -receiving corticosteroids like dexamethasone or prednisone -an unusual or allergic reaction to zoledronic acid, other medicines, foods, dyes, or preservatives -pregnant or trying to get pregnant -breast-feeding How should I use this medicine? This medicine is for infusion into a vein. It is given by a health care professional in a hospital or clinic setting. Talk to your pediatrician regarding the use of this medicine in children. Special care may be needed. Overdosage: If you think you have taken too much of this medicine contact a poison control center or emergency room at once. NOTE: This medicine is only for you. Do not share this medicine with others. What if I miss a dose? It is important not to miss your dose. Call your doctor or health care professional if you are unable to keep an appointment. What may interact with this medicine? -certain antibiotics given by injection -NSAIDs, medicines for pain and inflammation, like ibuprofen or naproxen -some diuretics like bumetanide, furosemide -teriparatide -thalidomide This list may not describe all possible interactions. Give your health care provider a list of all the medicines, herbs, non-prescription drugs, or  dietary supplements you use. Also tell them if you smoke, drink alcohol, or use illegal drugs. Some items may interact with your medicine. What should I watch for while using this medicine? Visit your doctor or health care professional for regular checkups. It may be some time before you see the benefit from this medicine. Do not stop taking your medicine unless your doctor tells you to. Your doctor may order blood tests or other tests to see how you are doing. Women should inform their doctor if they wish to become pregnant or think they might be pregnant. There is a potential for serious side effects to an unborn child. Talk to your health care professional or pharmacist for more information. You should make sure that you get enough calcium and vitamin D while you are taking this medicine. Discuss the foods you eat and the vitamins you take with your health care professional. Some people who take this medicine have severe bone, joint, and/or muscle pain. This medicine may also increase your risk for jaw problems or a broken thigh bone. Tell your doctor right away if you have severe pain in your jaw, bones, joints, or muscles. Tell your doctor if you have any pain that does not go away or that gets worse. Tell your dentist and dental surgeon that you are taking this medicine. You should not have major dental surgery while on this medicine. See your dentist to have a dental exam and fix any dental problems before starting this medicine. Take good care of your teeth while on this medicine. Make sure you see your dentist for regular follow-up appointments. What side effects may I notice from receiving this medicine? Side effects that   you should report to your doctor or health care professional as soon as possible: -allergic reactions like skin rash, itching or hives, swelling of the face, lips, or tongue -anxiety, confusion, or depression -breathing problems -changes in vision -eye pain -feeling faint or  lightheaded, falls -jaw pain, especially after dental work -mouth sores -muscle cramps, stiffness, or weakness -trouble passing urine or change in the amount of urine Side effects that usually do not require medical attention (report to your doctor or health care professional if they continue or are bothersome): -bone, joint, or muscle pain -constipation -diarrhea -fever -hair loss -irritation at site where injected -loss of appetite -nausea, vomiting -stomach upset -trouble sleeping -trouble swallowing -weak or tired This list may not describe all possible side effects. Call your doctor for medical advice about side effects. You may report side effects to FDA at 1-800-FDA-1088. Where should I keep my medicine? This drug is given in a hospital or clinic and will not be stored at home. NOTE: This sheet is a summary. It may not cover all possible information. If you have questions about this medicine, talk to your doctor, pharmacist, or health care provider.  2015, [employer]vier/Gold Standard. (2013-03-20 13:03:13) Pertuzumab injection What is this medicine? PERTUZUMAB (per TOOZ ue mab) is a monoclonal antibody that targets a protein called HER2. HER2 is found in some breast cancers. This medicine can stop cancer cell growth. This medicine is used with other cancer treatments. This medicine may be used for other purposes; ask your health care provider or pharmacist if you have questions. COMMON BRAND NAME(S): PERJETA What should I tell my health care provider before I take this medicine? They need to know if you have any of these conditions: -heart disease -heart failure -high blood pressure -history of irregular heart beat -recent or ongoing radiation therapy -an unusual or allergic reaction to pertuzumab, other medicines, foods, dyes, or preservatives -pregnant or trying to get pregnant -breast-feeding How should I use this medicine? This medicine is for infusion into a vein. It is  given by a health care professional in a hospital or clinic setting. Talk to your pediatrician regarding the use of this medicine in children. Special care may be needed. Overdosage: If you think you've taken too much of this medicine contact a poison control center or emergency room at once. Overdosage: If you think you have taken too much of this medicine contact a poison control center or emergency room at once. NOTE: This medicine is only for you. Do not share this medicine with others. What if I miss a dose? It is important not to miss your dose. Call your doctor or health care professional if you are unable to keep an appointment. What may interact with this medicine? Interactions are not expected. Give your health care provider a list of all the medicines, herbs, non-prescription drugs, or dietary supplements you use. Also tell them if you smoke, drink alcohol, or use illegal drugs. Some items may interact with your medicine. This list may not describe all possible interactions. Give your health care provider a list of all the medicines, herbs, non-prescription drugs, or dietary supplements you use. Also tell them if you smoke, drink alcohol, or use illegal drugs. Some items may interact with your medicine. What should I watch for while using this medicine? Your condition will be monitored carefully while you are receiving this medicine. Report any side effects. Continue your course of treatment even though you feel ill unless your doctor tells you to stop.  Do not become pregnant while taking this medicine. Women should inform their doctor if they wish to become pregnant or think they might be pregnant. There is a potential for serious side effects to an unborn child. Talk to your health care professional or pharmacist for more information. Do not breast-feed an infant while taking this medicine. Call your doctor or health care professional for advice if you get a fever, chills or sore throat,  or other symptoms of a cold or flu. Do not treat yourself. Try to avoid being around people who are sick. You may experience fever, chills, and headache during the infusion. Report any side effects during the infusion to your health care professional. What side effects may I notice from receiving this medicine? Side effects that you should report to your doctor or health care professional as soon as possible: -breathing problems -chest pain or palpitations -dizziness -feeling faint or lightheaded -fever or chills -skin rash, itching or hives -sore throat -swelling of the face, lips, or tongue -swelling of the legs or ankles -unusually weak or tired Side effects that usually do not require medical attention (Report these to your doctor or health care professional if they continue or are bothersome.): -diarrhea -hair loss -nausea, vomiting -tiredness This list may not describe all possible side effects. Call your doctor for medical advice about side effects. You may report side effects to FDA at 1-800-FDA-1088. Where should I keep my medicine? This drug is given in a hospital or clinic and will not be stored at home. NOTE: This sheet is a summary. It may not cover all possible information. If you have questions about this medicine, talk to your doctor, pharmacist, or health care provider.  2015, Elsevier/Gold Standard. (2012-08-07 16:54:15) Ado-Trastuzumab Emtansine for injection What is this medicine? Ado-trastuzumab emtansine (ADD oh traz TOO zuh mab em TAN zine) is a monoclonal antibody combined with chemotherapy. It targets a protein called HER2. This protein is found in some stomach and breast cancers. This medicine can stop cancer cell growth. This medicine may be used for other purposes; ask your health care provider or pharmacist if you have questions. COMMON BRAND NAME(S): Kadcyla What should I tell my health care provider before I take this medicine? They need to know if you  have any of these conditions: -heart disease -heart failure -infection (especially a virus infection such as chickenpox, cold sores, or herpes) -liver disease -lung or breathing disease, like asthma -an unusual or allergic reaction to ado-trastuzumab emtansine, other medications, foods, dyes, or preservatives -pregnant or trying to get pregnant -breast-feeding How should I use this medicine? This medicine is for infusion into a vein. It is given by a health care professional in a hospital or clinic setting. Talk to your pediatrician regarding the use of this medicine in children. Special care may be needed. Overdosage: If you think you've taken too much of this medicine contact a poison control center or emergency room at once. Overdosage: If you think you have taken too much of this medicine contact a poison control center or emergency room at once. NOTE: This medicine is only for you. Do not share this medicine with others. What if I miss a dose? It is important not to miss your dose. Call your doctor or health care professional if you are unable to keep an appointment. What may interact with this medicine? This medicine may also interact with the following medications: -atazanavir -boceprevir -clarithromycin -delavirdine -indinavir -dalfopristin; quinupristin -isoniazid, INH -itraconazole -ketoconazole -nefazodone -nelfinavir -ritonavir -  telaprevir -telithromycin -tipranavir -voriconazole This list may not describe all possible interactions. Give your health care provider a list of all the medicines, herbs, non-prescription drugs, or dietary supplements you use. Also tell them if you smoke, drink alcohol, or use illegal drugs. Some items may interact with your medicine. What should I watch for while using this medicine? Visit your doctor for checks on your progress. This drug may make you feel generally unwell. This is not uncommon, as chemotherapy can affect healthy cells as  well as cancer cells. Report any side effects. Continue your course of treatment even though you feel ill unless your doctor tells you to stop. Call your doctor or health care professional for advice if you get a fever, chills or sore throat, or other symptoms of a cold or flu. Do not treat yourself. This drug decreases your body's ability to fight infections. Try to avoid being around people who are sick. Be careful brushing and flossing your teeth or using a toothpick because you may get an infection or bleed more easily. If you have any dental work done, tell your dentist you are receiving this medicine. Avoid taking products that contain aspirin, acetaminophen, ibuprofen, naproxen, or ketoprofen unless instructed by your doctor. These medicines may hide a fever. Do not become pregnant while taking this medicine. Women should inform their doctor if they wish to become pregnant or think they might be pregnant. There is a potential for serious side effects to an unborn child. [Men should inform their doctors if they wish to father a child. This medicine may lower sperm counts.] Talk to your health care professional or pharmacist for more information. Do not breast-feed an infant while taking this medicine. You may need blood work done while you are taking this medicine. What side effects may I notice from receiving this medicine? Side effects that you should report to your doctor or health care professional as soon as possible: -allergic reactions like skin rash, itching or hives, swelling of the face, lips, or tongue -breathing problems -chest pain or palpitations -fever or chills, sore throat -general ill feeling or flu-like symptoms -light-colored stools -nausea, vomiting -pain, tingling, numbness in the hands or feet -signs and symptoms of bleeding such as bloody or black, tarry stools; red or dark-brown urine; spitting up blood or brown material that looks like coffee grounds; red spots on the  skin; unusual bruising or bleeding from the eye, gums, or nose -swelling of the legs or ankles -yellowing of the eyes or skin Side effects that usually do not require medical attention (Report these to your doctor or health care professional if they continue or are bothersome.): -changes in taste -constipation -dizziness -headache -joint pain -muscle pain -trouble sleeping -unusually weak or tired This list may not describe all possible side effects. Call your doctor for medical advice about side effects. You may report side effects to FDA at 1-800-FDA-1088. Where should I keep my medicine? This drug is given in a hospital or clinic and will not be stored at home. NOTE: This sheet is a summary. It may not cover all possible information. If you have questions about this medicine, talk to your doctor, pharmacist, or health care provider.  2015, Elsevier/Gold Standard. (2013-02-18 12:55:10)

## 2014-08-28 ENCOUNTER — Ambulatory Visit
Admission: RE | Admit: 2014-08-28 | Discharge: 2014-08-28 | Disposition: A | Discharge status: Home / Self Care | Payer: 59 | Source: Ambulatory Visit | Attending: Radiation Oncology | Admitting: Radiation Oncology

## 2014-08-28 ENCOUNTER — Encounter: Payer: Self-pay | Admitting: Radiation Oncology

## 2014-08-28 VITALS — BP 125/99 | HR 88 | Temp 97.9°F

## 2014-08-28 DIAGNOSIS — Z510 Encounter for antineoplastic radiation therapy: Secondary | ICD-10-CM | POA: Diagnosis not present

## 2014-08-28 DIAGNOSIS — C50911 Malignant neoplasm of unspecified site of right female breast: Secondary | ICD-10-CM

## 2014-08-28 LAB — CANCER ANTIGEN 27.29: CA 27.29: 29 U/mL (ref 0–39)

## 2014-08-28 LAB — LACTATE DEHYDROGENASE: LDH: 148 U/L (ref 94–250)

## 2014-08-28 NOTE — Progress Notes (Signed)
  Radiation Oncology         [phone] ________________________________  Name: Bianca Kent MRN: [id number]  Date: 08/28/2014  DOB: August 30, 1966  Weekly Radiation Therapy Management  DIAGNOSIS: Stage IV infiltrating ductal carcinoma the right breast (triple positive)  Current Dose: 30.6 Gy     Planned Dose:  50.4 Gy  Narrative . . . . . . . . The patient presents for routine under treatment assessment.                                   The patient has noticed a small area of skin breakdown in the inframammary fold area. She denies any chills or fever or drainage from this area. This happened earlier today. She does feel her bra was irritating this area yesterday.                                 Set-up films were reviewed.                                 The chart was checked. Physical Findings. . .  oral temperature is 97.9 F (36.6 C). Her blood pressure is 125/99 and her pulse is 88. . The right breast and subclavicular region shows some erythema and hyperpigmentation changes. In the inframammary fold patient has a small area of moist desquamation measuring approximately 1 x 2 cm in size. Impression . . . . . . . The patient is tolerating radiation. Plan . . . . . . . . . . . . Continue treatment as planned.  The patient is advised to use triple antibiotic ointment for her skin breakdown.  ________________________________   Blair Promise, PhD, MD

## 2014-08-28 NOTE — Progress Notes (Signed)
  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 The patient had set up of 3 oblique fields covering the target volume. A computerized isodose plan will be generated for treatment. Treatment planning then occurred.  The radiation prescription was entered and confirmed.  Then, I designed and supervised the construction of a total of 3 medically necessary complex treatment devices.  I have requested : Isodose Plan.  I have ordered:dose calc.  PLAN:  The patient will receive 5.4 Gy in 3 fractions directed at the axillary lymphadenopathy.  ________________________________  -----------------------------------  Blair Promise, PhD, MD

## 2014-08-28 NOTE — Progress Notes (Signed)
Bianca Kent noticed that her skin is open under her right breast.  She has a small open area.  Advised her to apply neosporin twice a day and to continue to use radiaplex on the rest of the treatment area.

## 2014-08-31 ENCOUNTER — Ambulatory Visit
Admission: RE | Admit: 2014-08-31 | Discharge: 2014-08-31 | Disposition: A | Discharge status: Home / Self Care | Payer: 59 | Source: Ambulatory Visit | Attending: Radiation Oncology | Admitting: Radiation Oncology

## 2014-08-31 ENCOUNTER — Telehealth: Payer: Self-pay | Admitting: Nutrition

## 2014-08-31 DIAGNOSIS — Z510 Encounter for antineoplastic radiation therapy: Secondary | ICD-10-CM | POA: Diagnosis not present

## 2014-08-31 NOTE — Telephone Encounter (Signed)
Patient was positive for malnutrition risk on the malnutrition risk screen secondary to weight loss and poor appetite. Contact patient on mobile phone and left message. Encouraged her to contact me if I could be of assistance. Provided name and contact information.

## 2014-09-01 ENCOUNTER — Ambulatory Visit
Admission: RE | Admit: 2014-09-01 | Discharge: 2014-09-01 | Disposition: A | Discharge status: Home / Self Care | Payer: 59 | Source: Ambulatory Visit | Attending: Radiation Oncology | Admitting: Radiation Oncology

## 2014-09-01 DIAGNOSIS — Z510 Encounter for antineoplastic radiation therapy: Secondary | ICD-10-CM | POA: Diagnosis not present

## 2014-09-02 ENCOUNTER — Ambulatory Visit
Admission: RE | Admit: 2014-09-02 | Discharge: 2014-09-02 | Disposition: A | Discharge status: Home / Self Care | Payer: 59 | Source: Ambulatory Visit | Attending: Radiation Oncology | Admitting: Radiation Oncology

## 2014-09-02 ENCOUNTER — Ambulatory Visit: Payer: 59 | Admitting: Radiation Oncology

## 2014-09-02 DIAGNOSIS — Z510 Encounter for antineoplastic radiation therapy: Secondary | ICD-10-CM | POA: Diagnosis not present

## 2014-09-03 ENCOUNTER — Ambulatory Visit
Admission: RE | Admit: 2014-09-03 | Discharge: 2014-09-03 | Disposition: A | Discharge status: Home / Self Care | Payer: 59 | Source: Ambulatory Visit | Attending: Radiation Oncology | Admitting: Radiation Oncology

## 2014-09-03 ENCOUNTER — Encounter: Payer: Self-pay | Admitting: Radiation Oncology

## 2014-09-03 VITALS — BP 109/74 | HR 93 | Temp 98.9°F | Resp 12 | Wt 193.3 lb

## 2014-09-03 DIAGNOSIS — C50919 Malignant neoplasm of unspecified site of unspecified female breast: Secondary | ICD-10-CM | POA: Diagnosis not present

## 2014-09-03 DIAGNOSIS — Z510 Encounter for antineoplastic radiation therapy: Secondary | ICD-10-CM | POA: Insufficient documentation

## 2014-09-03 DIAGNOSIS — C799 Secondary malignant neoplasm of unspecified site: Secondary | ICD-10-CM | POA: Diagnosis not present

## 2014-09-03 DIAGNOSIS — C50911 Malignant neoplasm of unspecified site of right female breast: Secondary | ICD-10-CM

## 2014-09-03 MED ORDER — BIAFINE EX EMUL
Freq: Two times a day (BID) | CUTANEOUS | Status: DC
  Administered 2014-09-03: 10:00:00 via TOPICAL

## 2014-09-03 MED ORDER — SILVER SULFADIAZINE 1 % EX CREA
TOPICAL_CREAM | Freq: Two times a day (BID) | CUTANEOUS | Status: DC
  Administered 2014-09-03: 10:00:00 via TOPICAL

## 2014-09-03 MED ORDER — SILVER SULFADIAZINE 1 % EX CREA: TOPICAL_CREAM | Freq: Every day | CUTANEOUS | Status: DC

## 2014-09-03 NOTE — Progress Notes (Signed)
  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 . . . . . . . The patient presents for routine under treatment assessment.                                   The patient is having significant discomfort and itching within the right axillary and inframammary fold area. She is placing Neosporin plus in these areas.                                 Set-up films were reviewed.                                 The chart was checked. Physical Findings. . .  weight is 193 lb 4.8 oz (87.68 kg). Her oral temperature is 98.9 F (37.2 C). Her blood pressure is 109/74 and her pulse is 93. Her respiration is 12 and oxygen saturation is 97%. . The lungs are clear. The heart has a regular rhythm and rate. The right breast area shows erythema and hyperpigmentation changes. Patient has early moist desquamation in the inframammary fold and right axillary region.   Impression . . . . . . . The patient is tolerating radiation. Plan . . . . . . . . . . . . Continue treatment as planned. The patient will be switched to Silvadene for the right axillary and inframammary fold area.  ________________________________   Blair Promise, PhD, MD

## 2014-09-03 NOTE — Progress Notes (Signed)
She rates her pain as mild. Pt complains of, Fever, Fatigue, Generalized Weakness and Poor Appetite reports feeling nausated.  Pt right breast positive for breast tenderness and pruritus, moist desquamation under right breast fold, erythema noted over breast and right back.  Noted an area over the right axillar fold that appears white/gray in appearance.  Pt reports continuing to apply Biafine and Neosporin with pain relief.  She said she wakes up at night "scratching to death."

## 2014-09-04 ENCOUNTER — Ambulatory Visit: Admission: RE | Admit: 2014-09-04 | Payer: 59 | Source: Ambulatory Visit

## 2014-09-07 ENCOUNTER — Ambulatory Visit: Admission: RE | Admit: 2014-09-07 | Payer: 59 | Source: Ambulatory Visit

## 2014-09-08 ENCOUNTER — Ambulatory Visit
Admission: RE | Admit: 2014-09-08 | Discharge: 2014-09-08 | Disposition: A | Discharge status: Home / Self Care | Payer: 59 | Source: Ambulatory Visit | Attending: Radiation Oncology | Admitting: Radiation Oncology

## 2014-09-08 ENCOUNTER — Encounter: Payer: Self-pay | Admitting: Radiation Oncology

## 2014-09-08 VITALS — BP 142/83 | HR 97 | Temp 98.0°F | Resp 20 | Wt 189.0 lb

## 2014-09-08 DIAGNOSIS — Z510 Encounter for antineoplastic radiation therapy: Secondary | ICD-10-CM | POA: Diagnosis not present

## 2014-09-08 DIAGNOSIS — C799 Secondary malignant neoplasm of unspecified site: Secondary | ICD-10-CM | POA: Insufficient documentation

## 2014-09-08 DIAGNOSIS — C50911 Malignant neoplasm of unspecified site of right female breast: Secondary | ICD-10-CM | POA: Diagnosis not present

## 2014-09-08 MED ORDER — SILVER SULFADIAZINE 1 % EX CREA
TOPICAL_CREAM | Freq: Two times a day (BID) | CUTANEOUS | Status: DC
  Administered 2014-09-08: 20:00:00 via TOPICAL

## 2014-09-08 NOTE — Progress Notes (Signed)
  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 . . . . . . . The patient presents for routine under treatment assessment.                                   The patient is having a lot of discomfort in the supraclavicular and axillary and inframammary fold area where the patient has developed moist desquamation. She continues to play Silvadene on these areas.                                 Set-up films were reviewed.                                 The chart was checked. Physical Findings. . .  weight is 189 lb (85.73 kg). Her oral temperature is 98 F (36.7 C). Her blood pressure is 142/83 and her pulse is 97. Her respiration is 20. . The lungs are clear. The heart has a regular rhythm and rate. The right breast in local regional area shows hyperpigmentation changes and erythema. There is healing moist desquamation in the supraclavicular inframammary fold and axillary areas. No signs of infection Impression . . . . . . . The patient is tolerating radiation. Plan . . . . . . . . . . . . Continue treatment as planned. She will continue Silvadene. They have also given the patient gel pads to see if this will be soothing for her.  ________________________________   Blair Promise, PhD, MD

## 2014-09-08 NOTE — Progress Notes (Signed)
Patient has been applying Silvadene cream under right breast and in right axilla for moist desquamation. She states these areas have not improved but have not gotten worse. She states these areas are painful. She is applying Biafine cream to other treatment areas of right breast. She takes Dilaudid 2 mg prn. She is fatigued.

## 2014-09-08 NOTE — Addendum Note (Signed)
Encounter addended by: Andria Rhein, RN on: 09/08/2014  7:58 PM<BR>     Documentation filed: Orders, [employer], Inpatient [facility]

## 2014-09-09 ENCOUNTER — Ambulatory Visit: Payer: 59

## 2014-09-09 ENCOUNTER — Ambulatory Visit
Admission: RE | Admit: 2014-09-09 | Discharge: 2014-09-09 | Disposition: A | Discharge status: Home / Self Care | Payer: 59 | Source: Ambulatory Visit | Attending: Radiation Oncology | Admitting: Radiation Oncology

## 2014-09-09 DIAGNOSIS — Z510 Encounter for antineoplastic radiation therapy: Secondary | ICD-10-CM | POA: Diagnosis not present

## 2014-09-10 ENCOUNTER — Ambulatory Visit: Payer: 59

## 2014-09-10 ENCOUNTER — Ambulatory Visit
Admission: RE | Admit: 2014-09-10 | Discharge: 2014-09-10 | Disposition: A | Discharge status: Home / Self Care | Payer: 59 | Source: Ambulatory Visit | Attending: Radiation Oncology | Admitting: Radiation Oncology

## 2014-09-10 DIAGNOSIS — Z510 Encounter for antineoplastic radiation therapy: Secondary | ICD-10-CM | POA: Diagnosis not present

## 2014-09-11 ENCOUNTER — Ambulatory Visit: Payer: 59

## 2014-09-11 ENCOUNTER — Ambulatory Visit
Admission: RE | Admit: 2014-09-11 | Discharge: 2014-09-11 | Disposition: A | Discharge status: Home / Self Care | Payer: 59 | Source: Ambulatory Visit | Attending: Radiation Oncology | Admitting: Radiation Oncology

## 2014-09-11 DIAGNOSIS — Z510 Encounter for antineoplastic radiation therapy: Secondary | ICD-10-CM | POA: Diagnosis not present

## 2014-09-14 ENCOUNTER — Ambulatory Visit: Payer: 59

## 2014-09-14 ENCOUNTER — Encounter: Payer: Self-pay | Admitting: Radiation Oncology

## 2014-09-14 DIAGNOSIS — Z510 Encounter for antineoplastic radiation therapy: Secondary | ICD-10-CM | POA: Diagnosis not present

## 2014-09-14 NOTE — Progress Notes (Signed)
  Radiation Oncology         [phone] ________________________________  Name: Bianca Kent MRN: [id number]  Date: 09/14/2014  DOB: 11-09-1965  Simulation Verification Note  Status: outpatient  NARRATIVE: The patient was brought to the treatment unit and placed in the planned treatment position. The clinical setup was verified. Then port films were obtained and uploaded to the radiation oncology medical record software.  The treatment beams were carefully compared against the planned radiation fields. The position location and shape of the radiation fields was reviewed. They targeted volume of tissue appears to be appropriately covered by the radiation beams. Organs at risk appear to be excluded as planned.  Based on my personal review, I approved the simulation verification. The patient's treatment will proceed as planned.  ------------------------------------------------  Sheral Apley Tammi Klippel, M.D.

## 2014-09-15 ENCOUNTER — Ambulatory Visit
Admission: RE | Admit: 2014-09-15 | Discharge: 2014-09-15 | Disposition: A | Discharge status: Home / Self Care | Payer: 59 | Source: Ambulatory Visit | Attending: Radiation Oncology | Admitting: Radiation Oncology

## 2014-09-15 ENCOUNTER — Ambulatory Visit: Payer: 59

## 2014-09-15 ENCOUNTER — Encounter: Payer: Self-pay | Admitting: Radiation Oncology

## 2014-09-15 VITALS — BP 129/79 | HR 94 | Temp 97.7°F | Resp 16 | Ht 66.0 in | Wt 179.9 lb

## 2014-09-15 DIAGNOSIS — C50911 Malignant neoplasm of unspecified site of right female breast: Secondary | ICD-10-CM | POA: Diagnosis not present

## 2014-09-15 DIAGNOSIS — Z510 Encounter for antineoplastic radiation therapy: Secondary | ICD-10-CM | POA: Diagnosis present

## 2014-09-15 DIAGNOSIS — C799 Secondary malignant neoplasm of unspecified site: Secondary | ICD-10-CM | POA: Insufficient documentation

## 2014-09-15 MED ORDER — BIAFINE EX EMUL
Freq: Once | CUTANEOUS | Status: AC
  Administered 2014-09-15: 16:00:00 via TOPICAL

## 2014-09-15 NOTE — Addendum Note (Signed)
Encounter addended by: Jacqulyn Liner, RN on: 09/15/2014  3:40 PM<BR>     Documentation filed: Dx [employer], Orders

## 2014-09-15 NOTE — Addendum Note (Signed)
Encounter addended by: Jacqulyn Liner, RN on: 09/15/2014  3:43 PM<BR>     Documentation filed: Inpatient MAR

## 2014-09-15 NOTE — Progress Notes (Signed)
Weekly Management Note Current Dose:  48.6 Gy  Projected Dose: 50.4 Gy   Narrative:  The patient presents for routine under treatment assessment.  CBCT/MVCT images/Port film x-rays were reviewed.  The chart was checked. Using silvadene. Pain continues. Has chemo on Friday with Dr. [name]. Decreased appetite being treated by Marinol.   Physical Findings: Weight: 179 lb 14.4 oz (81.602 kg).Moist desquamation continues in axilla and inframammary fold. REst of breast is dark. SCLV field is healing.   Impression:  The patient is tolerating radiation.  Plan:  Continue treatment as planned. Continue baifene/silvadene. Follow up in 2 weeks for skin check.

## 2014-09-15 NOTE — Progress Notes (Signed)
Bianca Kent has completed 27 fractions to her right breast and subclavian area.  She reports soreness in right breast.  She reports being fatigued.  She is taking ritalin to try to increase her energy levels.  She is working full time as a [employer] scientist.  She has lost 10 lbs since 09/08/14 and reports she does not have an appetite.  She is taking Marinol.  Orthostatic vitals taken: bp sitting 124/78, hr 85, bp standing 129/79, hr 94.  She has chemotherapy scheduled on Friday with Dr. [name].  The skin on her right breast is red with a healing area of desquamation underneath the breast.  She also has an area of desquamation on her right upper outer breast.  Her subclavian area and upper back is also red with peeling.  She is using biafine and silvadene twice a day.  She is using gel pads under her arm.  She is requesting a refill on biafine.  She has been given a one month follow up card.

## 2014-09-16 ENCOUNTER — Ambulatory Visit: Payer: 59

## 2014-09-16 ENCOUNTER — Ambulatory Visit
Admission: RE | Admit: 2014-09-16 | Discharge: 2014-09-16 | Disposition: A | Discharge status: Home / Self Care | Payer: 59 | Source: Ambulatory Visit | Attending: Radiation Oncology | Admitting: Radiation Oncology

## 2014-09-16 DIAGNOSIS — Z510 Encounter for antineoplastic radiation therapy: Secondary | ICD-10-CM | POA: Diagnosis not present

## 2014-09-18 ENCOUNTER — Other Ambulatory Visit (HOSPITAL_BASED_OUTPATIENT_CLINIC_OR_DEPARTMENT_OTHER): Payer: 59 | Admitting: Lab

## 2014-09-18 ENCOUNTER — Ambulatory Visit (HOSPITAL_BASED_OUTPATIENT_CLINIC_OR_DEPARTMENT_OTHER): Payer: 59

## 2014-09-18 ENCOUNTER — Ambulatory Visit (HOSPITAL_BASED_OUTPATIENT_CLINIC_OR_DEPARTMENT_OTHER): Payer: 59 | Admitting: Hematology & Oncology

## 2014-09-18 ENCOUNTER — Encounter: Payer: Self-pay | Admitting: Hematology & Oncology

## 2014-09-18 VITALS — BP 100/69 | HR 78 | Temp 98.0°F | Resp 20 | Wt 183.0 lb

## 2014-09-18 DIAGNOSIS — C50919 Malignant neoplasm of unspecified site of unspecified female breast: Secondary | ICD-10-CM

## 2014-09-18 DIAGNOSIS — C7951 Secondary malignant neoplasm of bone: Secondary | ICD-10-CM

## 2014-09-18 DIAGNOSIS — Z5112 Encounter for antineoplastic immunotherapy: Secondary | ICD-10-CM

## 2014-09-18 DIAGNOSIS — C773 Secondary and unspecified malignant neoplasm of axilla and upper limb lymph nodes: Secondary | ICD-10-CM

## 2014-09-18 DIAGNOSIS — C787 Secondary malignant neoplasm of liver and intrahepatic bile duct: Secondary | ICD-10-CM

## 2014-09-18 DIAGNOSIS — C50811 Malignant neoplasm of overlapping sites of right female breast: Secondary | ICD-10-CM

## 2014-09-18 DIAGNOSIS — Z170 Estrogen receptor positive status [ER+]: Secondary | ICD-10-CM

## 2014-09-18 LAB — CMP (CANCER CENTER ONLY)
ALBUMIN: 3.7 g/dL (ref 3.3–5.5)
ALT: 56 U/L — AB (ref 10–47)
AST: 75 U/L — AB (ref 11–38)
Alkaline Phosphatase: 144 U/L — ABNORMAL HIGH (ref 26–84)
BUN, Bld: 9 mg/dL (ref 7–22)
CALCIUM: 9 mg/dL (ref 8.0–10.3)
CHLORIDE: 104 meq/L (ref 98–108)
CO2: 24 meq/L (ref 18–33)
Creat: 0.8 mg/dl (ref 0.6–1.2)
Glucose, Bld: 93 mg/dL (ref 73–118)
POTASSIUM: 3.5 meq/L (ref 3.3–4.7)
SODIUM: 140 meq/L (ref 128–145)
TOTAL PROTEIN: 8.3 g/dL — AB (ref 6.4–8.1)
Total Bilirubin: 0.7 mg/dl (ref 0.20–1.60)

## 2014-09-18 LAB — CBC WITH DIFFERENTIAL (CANCER CENTER ONLY)
BASO#: 0 10*3/uL (ref 0.0–0.2)
BASO%: 0.2 % (ref 0.0–2.0)
EOS ABS: 0.2 10*3/uL (ref 0.0–0.5)
EOS%: 3.6 % (ref 0.0–7.0)
HCT: 43.3 % (ref 34.8–46.6)
HGB: 14.3 g/dL (ref 11.6–15.9)
LYMPH#: 0.6 10*3/uL — ABNORMAL LOW (ref 0.9–3.3)
LYMPH%: 12.3 % — ABNORMAL LOW (ref 14.0–48.0)
MCH: 29.1 pg (ref 26.0–34.0)
MCHC: 33 g/dL (ref 32.0–36.0)
MCV: 88 fL (ref 81–101)
MONO#: 0.5 10*3/uL (ref 0.1–0.9)
MONO%: 10 % (ref 0.0–13.0)
NEUT%: 73.9 % (ref 39.6–80.0)
NEUTROS ABS: 3.3 10*3/uL (ref 1.5–6.5)
PLATELETS: 150 10*3/uL (ref 145–400)
RBC: 4.91 10*6/uL (ref 3.70–5.32)
RDW: 14.7 % (ref 11.1–15.7)
WBC: 4.5 10*3/uL (ref 3.9–10.0)

## 2014-09-18 LAB — CANCER ANTIGEN 27.29: CA 27.29: 19 U/mL (ref 0–39)

## 2014-09-18 LAB — LACTATE DEHYDROGENASE: LDH: 178 U/L (ref 94–250)

## 2014-09-18 MED ORDER — SODIUM CHLORIDE 0.9 % IV SOLN
3.6000 mg/kg | Freq: Once | INTRAVENOUS | Status: AC
  Administered 2014-09-18: 320 mg via INTRAVENOUS
  Filled 2014-09-18: qty 16

## 2014-09-18 MED ORDER — SODIUM CHLORIDE 0.9 % IV SOLN
420.0000 mg | Freq: Once | INTRAVENOUS | Status: AC
  Administered 2014-09-18: 420 mg via INTRAVENOUS
  Filled 2014-09-18: qty 14

## 2014-09-18 MED ORDER — PALONOSETRON HCL INJECTION 0.25 MG/5ML
INTRAVENOUS | Status: AC
  Filled 2014-09-18: qty 5

## 2014-09-18 MED ORDER — HEPARIN SOD (PORK) LOCK FLUSH 100 UNIT/ML IV SOLN
500.0000 [IU] | Freq: Once | INTRAVENOUS | Status: AC | PRN
  Administered 2014-09-18: 500 [IU]
  Filled 2014-09-18: qty 5

## 2014-09-18 MED ORDER — HEPARIN SOD (PORK) LOCK FLUSH 100 UNIT/ML IV SOLN
250.0000 [IU] | Freq: Once | INTRAVENOUS | Status: DC | PRN
  Filled 2014-09-18: qty 5

## 2014-09-18 MED ORDER — ZOLEDRONIC ACID 4 MG/5ML IV CONC
4.0000 mg | Freq: Once | INTRAVENOUS | Status: AC
  Administered 2014-09-18: 4 mg via INTRAVENOUS
  Filled 2014-09-18: qty 5

## 2014-09-18 MED ORDER — ALTEPLASE 2 MG IJ SOLR
2.0000 mg | Freq: Once | INTRAMUSCULAR | Status: DC | PRN
  Filled 2014-09-18: qty 2

## 2014-09-18 MED ORDER — PALONOSETRON HCL INJECTION 0.25 MG/5ML
0.2500 mg | Freq: Once | INTRAVENOUS | Status: AC
  Administered 2014-09-18: 0.25 mg via INTRAVENOUS

## 2014-09-18 MED ORDER — SODIUM CHLORIDE 0.9 % IV SOLN
Freq: Once | INTRAVENOUS | Status: AC
  Administered 2014-09-18: 12:00:00 via INTRAVENOUS

## 2014-09-18 MED ORDER — DOXYCYCLINE HYCLATE 100 MG PO TABS: 100.0000 mg | ORAL_TABLET | Freq: Two times a day (BID) | ORAL | Status: DC

## 2014-09-18 MED ORDER — DIPHENHYDRAMINE HCL 25 MG PO CAPS
50.0000 mg | ORAL_CAPSULE | Freq: Once | ORAL | Status: AC
  Administered 2014-09-18: 50 mg via ORAL

## 2014-09-18 MED ORDER — MEGESTROL ACETATE 625 MG/5ML PO SUSP: 625.0000 mg | Freq: Every day | ORAL | Status: DC

## 2014-09-18 MED ORDER — ACETAMINOPHEN 325 MG PO TABS
ORAL_TABLET | ORAL | Status: AC
  Filled 2014-09-18: qty 2

## 2014-09-18 MED ORDER — SODIUM CHLORIDE 0.9 % IJ SOLN
3.0000 mL | INTRAMUSCULAR | Status: DC | PRN
  Filled 2014-09-18: qty 10

## 2014-09-18 MED ORDER — SODIUM CHLORIDE 0.9 % IJ SOLN
10.0000 mL | INTRAMUSCULAR | Status: DC | PRN
  Administered 2014-09-18: 10 mL
  Filled 2014-09-18: qty 10

## 2014-09-18 MED ORDER — ACETAMINOPHEN 325 MG PO TABS
650.0000 mg | ORAL_TABLET | Freq: Once | ORAL | Status: AC
  Administered 2014-09-18: 650 mg via ORAL

## 2014-09-18 MED ORDER — DIPHENHYDRAMINE HCL 25 MG PO CAPS
ORAL_CAPSULE | ORAL | Status: AC
  Filled 2014-09-18: qty 2

## 2014-09-18 MED ORDER — DRONABINOL 5 MG PO CAPS: 5.0000 mg | ORAL_CAPSULE | Freq: Three times a day (TID) | ORAL | Status: DC

## 2014-09-18 NOTE — Patient Instructions (Signed)
Ado-Trastuzumab Emtansine for injection What is this medicine? Ado-trastuzumab emtansine (ADD oh traz TOO zuh mab em TAN zine) is a monoclonal antibody combined with chemotherapy. It targets a protein called HER2. This protein is found in some stomach and breast cancers. This medicine can stop cancer cell growth. This medicine may be used for other purposes; ask your health care provider or pharmacist if you have questions. COMMON BRAND NAME(S): Kadcyla What should I tell my health care provider before I take this medicine? They need to know if you have any of these conditions: -heart disease -heart failure -infection (especially a virus infection such as chickenpox, cold sores, or herpes) -liver disease -lung or breathing disease, like asthma -an unusual or allergic reaction to ado-trastuzumab emtansine, other medications, foods, dyes, or preservatives -pregnant or trying to get pregnant -breast-feeding How should I use this medicine? This medicine is for infusion into a vein. It is given by a health care professional in a hospital or clinic setting. Talk to your pediatrician regarding the use of this medicine in children. Special care may be needed. Overdosage: If you think you've taken too much of this medicine contact a poison control center or emergency room at once. Overdosage: If you think you have taken too much of this medicine contact a poison control center or emergency room at once. NOTE: This medicine is only for you. Do not share this medicine with others. What if I miss a dose? It is important not to miss your dose. Call your doctor or health care professional if you are unable to keep an appointment. What may interact with this medicine? This medicine may also interact with the following medications: -atazanavir -boceprevir -clarithromycin -delavirdine -indinavir -dalfopristin; quinupristin -isoniazid,  INH -itraconazole -ketoconazole -nefazodone -nelfinavir -ritonavir -telaprevir -telithromycin -tipranavir -voriconazole This list may not describe all possible interactions. Give your health care provider a list of all the medicines, herbs, non-prescription drugs, or dietary supplements you use. Also tell them if you smoke, drink alcohol, or use illegal drugs. Some items may interact with your medicine. What should I watch for while using this medicine? Visit your doctor for checks on your progress. This drug may make you feel generally unwell. This is not uncommon, as chemotherapy can affect healthy cells as well as cancer cells. Report any side effects. Continue your course of treatment even though you feel ill unless your doctor tells you to stop. Call your doctor or health care professional for advice if you get a fever, chills or sore throat, or other symptoms of a cold or flu. Do not treat yourself. This drug decreases your body's ability to fight infections. Try to avoid being around people who are sick. Be careful brushing and flossing your teeth or using a toothpick because you may get an infection or bleed more easily. If you have any dental work done, tell your dentist you are receiving this medicine. Avoid taking products that contain aspirin, acetaminophen, ibuprofen, naproxen, or ketoprofen unless instructed by your doctor. These medicines may hide a fever. Do not become pregnant while taking this medicine. Women should inform their doctor if they wish to become pregnant or think they might be pregnant. There is a potential for serious side effects to an unborn child. [Men should inform their doctors if they wish to father a child. This medicine may lower sperm counts.] Talk to your health care professional or pharmacist for more information. Do not breast-feed an infant while taking this medicine. You may need blood work done   while you are taking this medicine. What side effects may  I notice from receiving this medicine? Side effects that you should report to your doctor or health care professional as soon as possible: -allergic reactions like skin rash, itching or hives, swelling of the face, lips, or tongue -breathing problems -chest pain or palpitations -fever or chills, sore throat -general ill feeling or flu-like symptoms -light-colored stools -nausea, vomiting -pain, tingling, numbness in the hands or feet -signs and symptoms of bleeding such as bloody or black, tarry stools; red or dark-brown urine; spitting up blood or brown material that looks like coffee grounds; red spots on the skin; unusual bruising or bleeding from the eye, gums, or nose -swelling of the legs or ankles -yellowing of the eyes or skin Side effects that usually do not require medical attention (Report these to your doctor or health care professional if they continue or are bothersome.): -changes in taste -constipation -dizziness -headache -joint pain -muscle pain -trouble sleeping -unusually weak or tired This list may not describe all possible side effects. Call your doctor for medical advice about side effects. You may report side effects to FDA at 1-800-FDA-1088. Where should I keep my medicine? This drug is given in a hospital or clinic and will not be stored at home. NOTE: This sheet is a summary. It may not cover all possible information. If you have questions about this medicine, talk to your doctor, pharmacist, or health care provider.  2015, Elsevier/Gold Standard. (2013-02-18 12:55:10) g is given in a hospital or clinic and will not be stored at home. NOTE: This sheet is a summary. It may not cover all possible information. If you have questions about this medicine, talk to your doctor, pharmacist, or health care provider.  2015, Elsevier/[employer] Standard. (2009-08-13 13:43:15) Pertuzumab injection What is this medicine? PERTUZUMAB (per TOOZ ue mab) is a monoclonal antibody  that targets a protein called HER2. HER2 is found in some breast cancers. This medicine can stop cancer cell growth. This medicine is used with other cancer treatments. This medicine may be used for other purposes; ask your health care provider or pharmacist if you have questions. COMMON BRAND NAME(S): PERJETA What should I tell my health care provider before I take this medicine? They need to know if you have any of these conditions: -heart disease -heart failure -high blood pressure -history of irregular heart beat -recent or ongoing radiation therapy -an unusual or allergic reaction to pertuzumab, other medicines, foods, dyes, or preservatives -pregnant or trying to get pregnant -breast-feeding How should I use this medicine? This medicine is for infusion into a vein. It is given by a health care professional in a hospital or clinic setting. Talk to your pediatrician regarding the use of this medicine in children. Special care may be needed. Overdosage: If you think you've taken too much of this medicine contact a poison control center or emergency room at once. Overdosage: If you think you have taken too much of this medicine contact a poison control center or emergency room at once. NOTE: This medicine is only for you. Do not share this medicine with others. What if I miss a dose? It is important not to miss your dose. Call your doctor or health care professional if you are unable to keep an appointment. What may interact with this medicine? Interactions are not expected. Give your health care provider a list of all the medicines, herbs, non-prescription drugs, or dietary supplements you use. Also tell them if you smoke,  drink alcohol, or use illegal drugs. Some items may interact with your medicine. This list may not describe all possible interactions. Give your health care provider a list of all the medicines, herbs, non-prescription drugs, or dietary supplements you use. Also tell  them if you smoke, drink alcohol, or use illegal drugs. Some items may interact with your medicine. What should I watch for while using this medicine? Your condition will be monitored carefully while you are receiving this medicine. Report any side effects. Continue your course of treatment even though you feel ill unless your doctor tells you to stop. Do not become pregnant while taking this medicine. Women should inform their doctor if they wish to become pregnant or think they might be pregnant. There is a potential for serious side effects to an unborn child. Talk to your health care professional or pharmacist for more information. Do not breast-feed an infant while taking this medicine. Call your doctor or health care professional for advice if you get a fever, chills or sore throat, or other symptoms of a cold or flu. Do not treat yourself. Try to avoid being around people who are sick. You may experience fever, chills, and headache during the infusion. Report any side effects during the infusion to your health care professional. What side effects may I notice from receiving this medicine? Side effects that you should report to your doctor or health care professional as soon as possible: -breathing problems -chest pain or palpitations -dizziness -feeling faint or lightheaded -fever or chills -skin rash, itching or hives -sore throat -swelling of the face, lips, or tongue -swelling of the legs or ankles -unusually weak or tired Side effects that usually do not require medical attention (Report these to your doctor or health care professional if they continue or are bothersome.): -diarrhea -hair loss -nausea, vomiting -tiredness This list may not describe all possible side effects. Call your doctor for medical advice about side effects. You may report side effects to FDA at 1-800-FDA-1088. Where should I keep my medicine? This drug is given in a hospital or clinic and will not be stored  at home. NOTE: This sheet is a summary. It may not cover all possible information. If you have questions about this medicine, talk to your doctor, pharmacist, or health care provider.  2015, Elsevier/Gold Standard. (2012-08-07 16:54:15)

## 2014-09-18 NOTE — Progress Notes (Signed)
Hematology and Oncology Follow Up Visit  Bianca Kent [id number] 10-26-1965 48 y.o. 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 3 week     Interim History:  Ms.  Bianca Kent is back for followup . She now has finished radiation. Unfortunately, she has a lot of radiation burns on the right breast and axilla. She is on some Silvadene cream. She may need some antibiotics. I may try some doxycycline for her.  She is still working. I told her that she really has to take at least a month off from work. I don't think with her working, that she will be able to heal up effectively enough with the dermatitis and skin breakdown. She really enjoys working. She works over at [employer] ([place] on [address]. This will be tough for her not to do but she understands why I have recommended this. Her sister, who comes with her, agrees fully with me. She still is quite tired. She has a little bit more energy. We started her on Ritalin .  Her appetite is still decreased. There is some constant nausea. As such, I have tried her on Marinol. I will try to increase the Marinol to on 5 mg 3 times a day. I will also add some Megace ES (5 mL by mouth daily).  She's had no bleeding. Her bowels are doing okay. She's had no cough. She's had no headache.  Currently, her performance status is ECOG 1  Medications: Current outpatient prescriptions: albuterol (PROVENTIL HFA;VENTOLIN HFA) 108 (90 BASE) MCG/ACT inhaler, Inhale 2 puffs into the lungs every 4 (four) hours as needed for wheezing or shortness of breath., Disp: 2 Inhaler, Rfl: 6;  ALPRAZolam (XANAX) 1 MG tablet, Take 1 tablet (1 mg total) by mouth at bedtime as needed for anxiety., Disp: 30 tablet, Rfl: 2;  aspirin (ASPIRIN EC) 81 MG EC tablet, Take 81 mg by mouth daily. Swallow whole., Disp: , Rfl:    Bismuth Subsalicylate (PEPTO-BISMOL PO), Take by mouth as needed., Disp: , Rfl: ;  calcium carbonate (TUMS - DOSED IN MG ELEMENTAL CALCIUM) 500 MG chewable tablet, Chew 2 tablets by mouth 2 (two) times daily., Disp: , Rfl: ;  Cholecalciferol (VITAMIN D) 2000 UNITS tablet, Take 2,000 Units by mouth daily., Disp: , Rfl:  diphenhydrAMINE (SOMINEX) 25 MG tablet, Take 100 mg by mouth at bedtime as needed for sleep. Pt states she is taking up to 200 mg Benadryl at night and still not sleeping, Disp: , Rfl: ;  dronabinol (MARINOL) 5 MG capsule, Take 1 capsule (5 mg total) by mouth 3 (three) times daily before meals., Disp: 60 capsule, Rfl: 0;  fentaNYL (DURAGESIC) 50 MCG/HR, Place 1 patch (50 mcg total) onto the skin every 3 (three) days., Disp: 10 patch, Rfl: 0 granisetron (SANCUSO) 3.1 MG/24HR, APPLY TO THE SKIN EVERY 7 DAYS., Disp: 4 each, Rfl: 4;  HYDROmorphone (DILAUDID) 4 MG tablet, Take 1/2 - 1 pill, IF NEEDED, for pain every 4 hrs, Disp: 90 tablet, Rfl: 0;  lidocaine-prilocaine (EMLA) cream, Apply 1 application topically as needed. Placeonto port site 43min prior to treatment and cover with plastic wrap., Disp: 30 g, Rfl: 2 loratadine-pseudoephedrine (CLARITIN-D 24-HOUR) 10-240 MG per 24 hr tablet, Take 1 tablet by mouth daily., Disp: , Rfl: ;  methylphenidate (RITALIN) 5 MG tablet, Take 1 tablet (  5 mg total) by mouth 2 (two) times daily., Disp: 60 tablet, Rfl: 0;  Multiple Vitamin (MULTIVITAMIN) tablet, Take 1 tablet by mouth daily., Disp: , Rfl: ;  naproxen sodium (ANAPROX) 220 MG tablet, Take 220 mg by mouth 2 (two) times daily with a meal., Disp: , Rfl:  ondansetron (ZOFRAN) 8 MG tablet, Take 1 tablet (8 mg total) by mouth every 8 (eight) hours as needed for nausea or vomiting., Disp: 20 tablet, Rfl: 2;  pantoprazole (PROTONIX) 40 MG tablet, Take 1 tablet (40 mg total) by mouth daily., Disp: 30 tablet, Rfl: 6;  prochlorperazine (COMPAZINE) 10 MG tablet, Take 1 tablet (10 mg total) by mouth every 6 (six)  hours as needed for nausea or vomiting., Disp: 60 tablet, Rfl: 2 promethazine (PHENERGAN) 25 MG suppository, Place 1 suppository (25 mg total) rectally every 6 (six) hours as needed for nausea or vomiting., Disp: 30 each, Rfl: 3;  traZODone (DESYREL) 50 MG tablet, Take 1-2 if needed at bed for sleep., Disp: 60 tablet, Rfl: 3;  megestrol (MEGACE ES) 625 MG/5ML suspension, Take 5 mLs (625 mg total) by mouth daily., Disp: 150 mL, Rfl: 0 Current facility-administered medications: alteplase (CATHFLO ACTIVASE) injection 2 mg, 2 mg, Intracatheter, Once PRN, [name], MD;  heparin lock flush 100 unit/mL, 250 Units, Intracatheter, Once PRN, [name], MD;  sodium chloride 0.9 % injection 10 mL, 10 mL, Intracatheter, PRN, [name], MD, 10 mL at 09/18/14 1518;  sodium chloride 0.9 % injection 3 mL, 3 mL, Intravenous, PRN, [name], MD  Allergies:  Allergies  Allergen Reactions  . Iohexol      Code: RASH, Desc: VERY STRONG FAMILY HX OF ANGIOEDEMA WHEN RECEIVING IV CONTRAST; PT HAS BEEN PREMEDICATED FOR OTHER CONTRASTED STUDIES(IN CATH. LAB)  KR, Onset Date: [id number]   . Prednisone Itching    Capillary beds bust  . Tetanus Toxoids Other (See Comments)    Ran a high fever for 48 hours  . Theophyllines Hives    Mental changes  . Versed [Midazolam] Other (See Comments)    Pt becomes violent    Past Medical History, Surgical history, Social history, and Family History were reviewed and updated.  Review of Systems: As above  Physical Exam:  weight is 183 lb (83.008 kg). Her oral temperature is 98 F (36.7 C). Her blood pressure is 100/69 and her pulse is 78. Her respiration is 20.   Well-developed and well-nourished white female. Head and exam shows decreased fullness in the right supraclavicular region. It is not as tender in this region. She has decreased right axillary adenopathy. She has improved range of motion of the right shoulder. Left supraclavicular region is okay. No  masses are noted. Lungs are clear. Cardiac exam regular irate and rhythm with no murmurs, rubs or bruits.. Abdomen is soft. She has good bowel sounds. There is no palpable liver or spleen tip. Back exam shows decreased tenderness over the lumbar and thoracic spine to palpation. Extremities shows no clubbing, cyanosis or edema. She has 4/5 strength in her legs. Skin exam shows significant radiation  Breakdown on the right rest and axilla. She has no exudate. This is a wet dermatitis.      Lab Results  Component Value Date   WBC 4.5 09/18/2014   HGB 14.3 09/18/2014   HCT 43.3 09/18/2014   MCV 88 09/18/2014   PLT 150 09/18/2014     Chemistry      Component Value Date/Time   NA 140  09/18/2014 0933   NA 136 03/24/2014 1147   K 3.5 09/18/2014 0933   K 4.2 03/24/2014 1147   CL 104 09/18/2014 0933   CL 105 03/24/2014 1147   CO2 24 09/18/2014 0933   CO2 21 03/24/2014 1147   BUN 9 09/18/2014 0933   BUN 15 03/24/2014 1147   CREATININE 0.8 09/18/2014 0933   CREATININE 0.83 03/24/2014 1147      Component Value Date/Time   CALCIUM 9.0 09/18/2014 0933   CALCIUM 8.8 03/24/2014 1147   ALKPHOS 144* 09/18/2014 0933   ALKPHOS 106 03/24/2014 1147   AST 75* 09/18/2014 0933   AST 19 03/24/2014 1147   ALT 56* 09/18/2014 0933   ALT 28 03/24/2014 1147   BILITOT 0.70 09/18/2014 0933   BILITOT 0.5 03/24/2014 1147         Impression and Plan: Ms. Wall is 48 year old white female with triple positive metastatic breast cancer. We now have her on Kadcyla. She tolerated herfirst 2  cycles well. Her big problem now is this radiation dermatitis. This will heal up. Again, she really needs to be off work for about a month. She agrees to this. She will bring me the paperwork so we can fill out  I am a little worried about the elevated liver tests. This may be from radiation. However, we may have to do some scans on her to see what is going on.  Her CA 27.29 hasbeen coming down. Today, it is only  19. This has to be encouraging.  I spent about 40 minutes with she and her sister today. I told her again, that we can certainly fill out disability forms for her if she wants to stop working.  [name], MD 11/27/20156:26 PM

## 2014-09-21 ENCOUNTER — Telehealth: Payer: Self-pay | Admitting: Hematology & Oncology

## 2014-09-21 NOTE — Telephone Encounter (Signed)
Left pt message to call about 12-7 scan

## 2014-09-22 ENCOUNTER — Encounter: Payer: Self-pay | Admitting: Radiation Oncology

## 2014-09-22 NOTE — Progress Notes (Signed)
  Radiation Oncology         (336) [phone] ________________________________  Name: Bianca Kent MRN: [id number]  Date: 09/22/2014  DOB: 25-Feb-1966  End of Treatment Note  Diagnosis: Stage IV infiltrating ductal carcinoma the right breast (triple positive)     Indication for treatment:  Pain in the right axillary and shoulder area as well as brachial plexus involvement       Radiation treatment dates:   October 15 through November 25  Site/dose:   Right breast and axillary and supraclavicular region 45 gray in 25 fractions; the adenopathy in the axillary area was boosted to 50.4 gray  Beams/energy:   4 field set up initially, followed by a 3-D boost directed at the residual lymphadenopathy in the axillary area  Narrative: The patient tolerated radiation treatment relatively well.   She had improvement in her pain in the axillary and shoulder region.  The patient did develop moist desquamation in the axillary region and inframammary fold area. This was treated with Silvadene  Plan: The patient has completed radiation treatment. The patient will return to radiation oncology clinic for routine followup in one month. I advised them to call or return sooner if they have any questions or concerns related to their recovery or treatment.  -----------------------------------  Blair Promise, PhD, MD

## 2014-09-23 ENCOUNTER — Telehealth: Payer: Self-pay | Admitting: Hematology & Oncology

## 2014-09-23 NOTE — Telephone Encounter (Signed)
Pt aware of 12-7 PET and to be NPO 6 hrs

## 2014-09-28 ENCOUNTER — Encounter: Payer: Self-pay | Admitting: Oncology

## 2014-09-28 ENCOUNTER — Telehealth: Payer: Self-pay | Admitting: Hematology & Oncology

## 2014-09-28 ENCOUNTER — Ambulatory Visit (HOSPITAL_COMMUNITY)
Admission: RE | Admit: 2014-09-28 | Discharge: 2014-09-28 | Disposition: A | Discharge status: Home / Self Care | Payer: 59 | Source: Ambulatory Visit | Attending: Hematology & Oncology | Admitting: Hematology & Oncology

## 2014-09-28 DIAGNOSIS — R5383 Other fatigue: Secondary | ICD-10-CM

## 2014-09-28 DIAGNOSIS — C799 Secondary malignant neoplasm of unspecified site: Secondary | ICD-10-CM | POA: Diagnosis not present

## 2014-09-28 DIAGNOSIS — C50911 Malignant neoplasm of unspecified site of right female breast: Secondary | ICD-10-CM | POA: Diagnosis present

## 2014-09-28 DIAGNOSIS — R64 Cachexia: Secondary | ICD-10-CM

## 2014-09-28 HISTORY — DX: Reserved for concepts with insufficient information to code with codable children: IMO0002

## 2014-09-28 HISTORY — DX: Reserved for inherently not codable concepts without codable children: IMO0001

## 2014-09-28 LAB — GLUCOSE, CAPILLARY: GLUCOSE-CAPILLARY: 81 mg/dL (ref 70–99)

## 2014-09-28 MED ORDER — FLUDEOXYGLUCOSE F - 18 (FDG) INJECTION
9.1000 | Freq: Once | INTRAVENOUS | Status: AC | PRN
  Administered 2014-09-28: 9.1 via INTRAVENOUS

## 2014-09-28 NOTE — Telephone Encounter (Addendum)
Peer to peer was DENIED to have Blanco together. Dos: Not covered are 08/06/2014 & 09/18/2014.  Going forward, they are only covered as single agents only - not together    This is not covered J9354 PR INJ, ADO-TRASTUZUMAB EMT 1MG  - KADCYLA J9306 PR INJECTION, PERTUZUMAB, 1 MG  -  PERJETA   This is covered - alone J9354 PR TRASTUZUMAB  This is covered - alone J9306 PR INJECTION, PERTUZUMAB, 1 MG

## 2014-09-29 ENCOUNTER — Telehealth: Payer: Self-pay | Admitting: Hematology & Oncology

## 2014-09-29 ENCOUNTER — Encounter: Payer: Self-pay | Admitting: Radiation Oncology

## 2014-09-29 ENCOUNTER — Ambulatory Visit
Admission: RE | Admit: 2014-09-29 | Discharge: 2014-09-29 | Disposition: A | Discharge status: Home / Self Care | Payer: 59 | Source: Ambulatory Visit | Attending: Radiation Oncology | Admitting: Radiation Oncology

## 2014-09-29 VITALS — BP 130/90 | HR 92 | Temp 98.6°F | Resp 16 | Ht 66.0 in | Wt 180.1 lb

## 2014-09-29 DIAGNOSIS — Z79899 Other long term (current) drug therapy: Secondary | ICD-10-CM | POA: Diagnosis not present

## 2014-09-29 DIAGNOSIS — Z7982 Long term (current) use of aspirin: Secondary | ICD-10-CM | POA: Diagnosis not present

## 2014-09-29 DIAGNOSIS — C50911 Malignant neoplasm of unspecified site of right female breast: Secondary | ICD-10-CM | POA: Insufficient documentation

## 2014-09-29 DIAGNOSIS — L599 Disorder of the skin and subcutaneous tissue related to radiation, unspecified: Secondary | ICD-10-CM | POA: Diagnosis not present

## 2014-09-29 DIAGNOSIS — Z791 Long term (current) use of non-steroidal anti-inflammatories (NSAID): Secondary | ICD-10-CM | POA: Insufficient documentation

## 2014-09-29 DIAGNOSIS — C799 Secondary malignant neoplasm of unspecified site: Secondary | ICD-10-CM | POA: Diagnosis not present

## 2014-09-29 MED ORDER — BIAFINE EX EMUL
Freq: Once | CUTANEOUS | Status: AC
  Administered 2014-09-29: 10:00:00 via TOPICAL

## 2014-09-29 NOTE — Progress Notes (Signed)
Bianca Kent here for follow up after treatment to her right breast.  She denies pain but does have soreness under her right arm.  She continues to use a fentanyl patch and dilaudid prn.  She reports vomiting 6-7 times last night and used 2 phenergan suppositories.  Her last chemotherapy was on the Friday after Thanksgiving.  She reports having a poor appetite and has been nauseated every since her last chemotherapy.  She is taking megace and Marinol.  She reports fatigue.  The skin on her right breast has hyperpigmentation and healing areas underneath her breast.  She is using silvadene twice a day and biafine.

## 2014-09-29 NOTE — Progress Notes (Signed)
Radiation Oncology         [phone] ________________________________  Name: Bianca Kent MRN: [id number]  Date: 09/29/2014  DOB: 1965/10/31  Follow-Up Visit Note  CC: Jenny Reichmann, MD  [name], MD    ICD-9-CM ICD-10-CM   1. 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  She is having no further drainage from the right axillary and inframammary fold area. She continues to use Biafine and Silvadene.  Overall the patient's pain in the right axillary area has improved with her radiation treatment. She continues to have appetite issues and nausea. Patient is on Marinol and Megace.                              ALLERGIES:  is allergic to iohexol; prednisone; tetanus toxoids; theophyllines; and versed.  Meds: Current Outpatient Prescriptions  Medication Sig Dispense Refill  . albuterol (PROVENTIL HFA;VENTOLIN HFA) 108 (90 BASE) MCG/ACT inhaler Inhale 2 puffs into the lungs every 4 (four) hours as needed for wheezing or shortness of breath. 2 Inhaler 6  . ALPRAZolam (XANAX) 1 MG tablet Take 1 tablet (1 mg total) by mouth at bedtime as needed for anxiety. 30 tablet 2  . aspirin (ASPIRIN EC) 81 MG EC tablet Take 81 mg by mouth daily. Swallow whole.    . Bismuth Subsalicylate (PEPTO-BISMOL PO) Take by mouth as needed.    . calcium carbonate (TUMS - DOSED IN MG ELEMENTAL CALCIUM) 500 MG chewable tablet Chew 2 tablets by mouth 2 (two) times daily.    . Cholecalciferol (VITAMIN D) 2000 UNITS tablet Take 2,000 Units by mouth daily.    . diphenhydrAMINE (SOMINEX) 25 MG tablet Take 100 mg by mouth at bedtime as needed for sleep. Pt states she is taking up to 200 mg Benadryl at night and still not sleeping    . dronabinol (MARINOL) 5 MG  capsule Take 1 capsule (5 mg total) by mouth 3 (three) times daily before meals. 60 capsule 0  . emollient (BIAFINE) cream Apply topically as needed.    . fentaNYL (DURAGESIC) 50 MCG/HR Place 1 patch (50 mcg total) onto the skin every 3 (three) days. 10 patch 0  . granisetron (SANCUSO) 3.1 MG/24HR APPLY TO THE SKIN EVERY 7 DAYS. 4 each 4  . HYDROmorphone (DILAUDID) 4 MG tablet Take 1/2 - 1 pill, IF NEEDED, for pain every 4 hrs 90 tablet 0  . lidocaine-prilocaine (EMLA) cream Apply 1 application topically as needed. Placeonto port site 74min prior to treatment and cover with plastic wrap. 30 g 2  . loratadine-pseudoephedrine (CLARITIN-D 24-HOUR) 10-240 MG per 24 hr tablet Take 1 tablet by mouth daily.    . megestrol (MEGACE ES) 625 MG/5ML suspension Take 5 mLs (625 mg total) by mouth daily. 150 mL 0  . methylphenidate (RITALIN) 5 MG tablet Take 1 tablet (5 mg total) by mouth 2 (two) times daily. 60 tablet 0  . Multiple Vitamin (MULTIVITAMIN) tablet Take 1 tablet by mouth daily.    . naproxen sodium (ANAPROX) 220 MG tablet Take 220 mg by mouth 2 (two) times daily with a meal.    . ondansetron (ZOFRAN) 8 MG tablet Take 1 tablet (8 mg total)  by mouth every 8 (eight) hours as needed for nausea or vomiting. 20 tablet 2  . pantoprazole (PROTONIX) 40 MG tablet Take 1 tablet (40 mg total) by mouth daily. 30 tablet 6  . prochlorperazine (COMPAZINE) 10 MG tablet Take 1 tablet (10 mg total) by mouth every 6 (six) hours as needed for nausea or vomiting. 60 tablet 2  . promethazine (PHENERGAN) 25 MG suppository Place 1 suppository (25 mg total) rectally every 6 (six) hours as needed for nausea or vomiting. 30 each 3  . silver sulfADIAZINE (SILVADENE) 1 % cream Apply 1 application topically 2 (two) times daily.    . traZODone (DESYREL) 50 MG tablet Take 1-2 if needed at bed for sleep. 60 tablet 3   Current Facility-Administered Medications  Medication Dose Route Frequency Provider Last Rate Last Dose  . topical  emolient (BIAFINE) emulsion   Topical Once Blair Promise, MD        Physical Findings: The patient is in no acute distress. Patient is alert and oriented.  height is 5\' 6"  (1.676 m) and weight is 180 lb 1.6 oz (81.693 kg). Her oral temperature is 98.6 F (37 C). Her blood pressure is 130/90 and her pulse is 92. Her respiration is 16. .  The lungs are clear except for mildly decreased breath sounds in the right lower lung field. The heart has a regular rhythm and rate. Examination of the axillary breast and right upper back shows hyperpigmentation changes and dry desquamation. No further moist desquamation.  Lab Findings: Lab Results  Component Value Date   WBC 4.5 09/18/2014   HGB 14.3 09/18/2014   HCT 43.3 09/18/2014   MCV 88 09/18/2014   PLT 150 09/18/2014    Radiographic Findings: No results found.  Impression:  The patient is recovering from the effects of radiation.  The patient's skin is healing well at this time. No signs of infection. No further moist desquamation.   Plan:  The patient will continue using Biafine. Routine followup in 3 months  ____________________________________ Blair Promise, MD

## 2014-09-29 NOTE — Addendum Note (Signed)
Encounter addended by: Jacqulyn Liner, RN on: 09/29/2014  9:50 AM<BR>     Documentation filed: Inpatient MAR

## 2014-09-29 NOTE — Telephone Encounter (Signed)
UPDATE   Peer to peer was OVERTURNED and APPROVED to have Doland and Perjeta together.  Dos: 08/06/2014 & 03/23/2015 Auth: [id number]     This is now a covered combination:  J9354 PR INJ, ADO-TRASTUZUMAB EMT 1MG  - KADCYLA J9306 PR INJECTION, PERTUZUMAB, 1 MG - PERJETA

## 2014-10-08 ENCOUNTER — Other Ambulatory Visit (HOSPITAL_BASED_OUTPATIENT_CLINIC_OR_DEPARTMENT_OTHER): Payer: 59 | Admitting: Lab

## 2014-10-08 ENCOUNTER — Ambulatory Visit (HOSPITAL_BASED_OUTPATIENT_CLINIC_OR_DEPARTMENT_OTHER): Payer: 59

## 2014-10-08 ENCOUNTER — Ambulatory Visit (HOSPITAL_BASED_OUTPATIENT_CLINIC_OR_DEPARTMENT_OTHER): Payer: 59 | Admitting: Hematology & Oncology

## 2014-10-08 ENCOUNTER — Encounter: Payer: Self-pay | Admitting: Hematology & Oncology

## 2014-10-08 VITALS — BP 108/78 | HR 95 | Temp 97.9°F | Resp 14 | Ht 66.0 in | Wt 178.0 lb

## 2014-10-08 DIAGNOSIS — C50911 Malignant neoplasm of unspecified site of right female breast: Secondary | ICD-10-CM

## 2014-10-08 DIAGNOSIS — R5383 Other fatigue: Secondary | ICD-10-CM

## 2014-10-08 DIAGNOSIS — R64 Cachexia: Secondary | ICD-10-CM

## 2014-10-08 DIAGNOSIS — C50811 Malignant neoplasm of overlapping sites of right female breast: Secondary | ICD-10-CM

## 2014-10-08 DIAGNOSIS — G4700 Insomnia, unspecified: Secondary | ICD-10-CM

## 2014-10-08 DIAGNOSIS — J4530 Mild persistent asthma, uncomplicated: Secondary | ICD-10-CM

## 2014-10-08 DIAGNOSIS — L308 Other specified dermatitis: Secondary | ICD-10-CM

## 2014-10-08 DIAGNOSIS — C799 Secondary malignant neoplasm of unspecified site: Secondary | ICD-10-CM

## 2014-10-08 DIAGNOSIS — Z5112 Encounter for antineoplastic immunotherapy: Secondary | ICD-10-CM

## 2014-10-08 DIAGNOSIS — C50919 Malignant neoplasm of unspecified site of unspecified female breast: Secondary | ICD-10-CM

## 2014-10-08 LAB — CBC WITH DIFFERENTIAL (CANCER CENTER ONLY)
BASO#: 0 10*3/uL (ref 0.0–0.2)
BASO%: 0.6 % (ref 0.0–2.0)
EOS ABS: 0.1 10*3/uL (ref 0.0–0.5)
EOS%: 2.5 % (ref 0.0–7.0)
HCT: 43.4 % (ref 34.8–46.6)
HGB: 14.7 g/dL (ref 11.6–15.9)
LYMPH#: 0.8 10*3/uL — ABNORMAL LOW (ref 0.9–3.3)
LYMPH%: 16.3 % (ref 14.0–48.0)
MCH: 29.5 pg (ref 26.0–34.0)
MCHC: 33.9 g/dL (ref 32.0–36.0)
MCV: 87 fL (ref 81–101)
MONO#: 0.5 10*3/uL (ref 0.1–0.9)
MONO%: 10.4 % (ref 0.0–13.0)
NEUT#: 3.4 10*3/uL (ref 1.5–6.5)
NEUT%: 70.2 % (ref 39.6–80.0)
Platelets: 142 10*3/uL — ABNORMAL LOW (ref 145–400)
RBC: 4.98 10*6/uL (ref 3.70–5.32)
RDW: 15.7 % (ref 11.1–15.7)
WBC: 4.8 10*3/uL (ref 3.9–10.0)

## 2014-10-08 LAB — CMP (CANCER CENTER ONLY)
ALK PHOS: 191 U/L — AB (ref 26–84)
ALT(SGPT): 45 U/L (ref 10–47)
AST: 60 U/L — ABNORMAL HIGH (ref 11–38)
Albumin: 3.4 g/dL (ref 3.3–5.5)
BILIRUBIN TOTAL: 0.8 mg/dL (ref 0.20–1.60)
BUN, Bld: 7 mg/dL (ref 7–22)
CO2: 27 meq/L (ref 18–33)
CREATININE: 0.7 mg/dL (ref 0.6–1.2)
Calcium: 9 mg/dL (ref 8.0–10.3)
Chloride: 101 mEq/L (ref 98–108)
GLUCOSE: 118 mg/dL (ref 73–118)
Potassium: 3.3 mEq/L (ref 3.3–4.7)
Sodium: 140 mEq/L (ref 128–145)
Total Protein: 8.6 g/dL — ABNORMAL HIGH (ref 6.4–8.1)

## 2014-10-08 LAB — CANCER ANTIGEN 27.29: CA 27.29: 26 U/mL (ref 0–39)

## 2014-10-08 LAB — LACTATE DEHYDROGENASE: LDH: 193 U/L (ref 94–250)

## 2014-10-08 MED ORDER — PALONOSETRON HCL INJECTION 0.25 MG/5ML
0.2500 mg | Freq: Once | INTRAVENOUS | Status: AC
  Administered 2014-10-08: 0.25 mg via INTRAVENOUS

## 2014-10-08 MED ORDER — ACETAMINOPHEN 325 MG PO TABS
650.0000 mg | ORAL_TABLET | Freq: Once | ORAL | Status: AC
  Administered 2014-10-08: 650 mg via ORAL

## 2014-10-08 MED ORDER — DIPHENHYDRAMINE HCL 25 MG PO CAPS
ORAL_CAPSULE | ORAL | Status: AC
  Filled 2014-10-08: qty 2

## 2014-10-08 MED ORDER — SODIUM CHLORIDE 0.9 % IV SOLN
420.0000 mg | Freq: Once | INTRAVENOUS | Status: AC
  Administered 2014-10-08: 420 mg via INTRAVENOUS
  Filled 2014-10-08: qty 14

## 2014-10-08 MED ORDER — SODIUM CHLORIDE 0.9 % IV SOLN
Freq: Once | INTRAVENOUS | Status: AC
  Administered 2014-10-08: 10:00:00 via INTRAVENOUS

## 2014-10-08 MED ORDER — DIPHENHYDRAMINE HCL 25 MG PO CAPS
50.0000 mg | ORAL_CAPSULE | Freq: Once | ORAL | Status: AC
  Administered 2014-10-08: 50 mg via ORAL

## 2014-10-08 MED ORDER — TRAZODONE HCL 100 MG PO TABS
ORAL_TABLET | ORAL | Status: DC
Start: 2014-10-08 — End: 2015-05-01

## 2014-10-08 MED ORDER — METHYLPHENIDATE HCL 10 MG PO TABS: 10.0000 mg | ORAL_TABLET | Freq: Two times a day (BID) | ORAL | Status: DC

## 2014-10-08 MED ORDER — PALONOSETRON HCL INJECTION 0.25 MG/5ML
INTRAVENOUS | Status: AC
  Filled 2014-10-08: qty 5

## 2014-10-08 MED ORDER — ACETAMINOPHEN 325 MG PO TABS
ORAL_TABLET | ORAL | Status: AC
  Filled 2014-10-08: qty 2

## 2014-10-08 MED ORDER — SODIUM CHLORIDE 0.9 % IV SOLN
3.6000 mg/kg | Freq: Once | INTRAVENOUS | Status: AC
  Administered 2014-10-08: 320 mg via INTRAVENOUS
  Filled 2014-10-08: qty 16

## 2014-10-08 MED ORDER — HEPARIN SOD (PORK) LOCK FLUSH 100 UNIT/ML IV SOLN
500.0000 [IU] | Freq: Once | INTRAVENOUS | Status: AC | PRN
  Administered 2014-10-08: 500 [IU]
  Filled 2014-10-08: qty 5

## 2014-10-08 MED ORDER — SODIUM CHLORIDE 0.9 % IJ SOLN
10.0000 mL | INTRAMUSCULAR | Status: DC | PRN
  Administered 2014-10-08: 10 mL
  Filled 2014-10-08: qty 10

## 2014-10-08 MED ORDER — ZOLEDRONIC ACID 4 MG/100ML IV SOLN
4.0000 mg | Freq: Once | INTRAVENOUS | Status: AC
  Administered 2014-10-08: 4 mg via INTRAVENOUS
  Filled 2014-10-08: qty 100

## 2014-10-08 NOTE — Progress Notes (Signed)
MD aware of labs and is okay to proceed with chemo treatment today. At approximately 1251 when flushing the patient's portacath, pt stated she felt fluid running down her chest.  Nurse and pt examined this and found that port site was wet.  Informed MD and pharmacist of this info. Per protocol, cleaned pt's chest off and gave pt a new top to change in to due to that fact her top was damp.  Pt verbalized understanding of instructions and verbalized that she knows to wash her clothes twice (alone) at home. Informed pt to call our facility if she noticed any problems with her skin; Pt verbalized understanding.

## 2014-10-08 NOTE — Patient Instructions (Addendum)
Zoledronic Acid injection (Hypercalcemia, Oncology) What is this medicine? ZOLEDRONIC ACID (ZOE le dron ik AS id) lowers the amount of calcium loss from bone. It is used to treat too much calcium in your blood from cancer. It is also used to prevent complications of cancer that has spread to the bone. This medicine may be used for other purposes; ask your health care provider or pharmacist if you have questions. COMMON BRAND NAME(S): Zometa What should I tell my health care provider before I take this medicine? They need to know if you have any of these conditions: -aspirin-sensitive asthma -cancer, especially if you are receiving medicines used to treat cancer -dental disease or wear dentures -infection -kidney disease -receiving corticosteroids like dexamethasone or prednisone -an unusual or allergic reaction to zoledronic acid, other medicines, foods, dyes, or preservatives -pregnant or trying to get pregnant -breast-feeding How should I use this medicine? This medicine is for infusion into a vein. It is given by a health care professional in a hospital or clinic setting. Talk to your pediatrician regarding the use of this medicine in children. Special care may be needed. Overdosage: If you think you have taken too much of this medicine contact a poison control center or emergency room at once. NOTE: This medicine is only for you. Do not share this medicine with others. What if I miss a dose? It is important not to miss your dose. Call your doctor or health care professional if you are unable to keep an appointment. What may interact with this medicine? -certain antibiotics given by injection -NSAIDs, medicines for pain and inflammation, like ibuprofen or naproxen -some diuretics like bumetanide, furosemide -teriparatide -thalidomide This list may not describe all possible interactions. Give your health care provider a list of all the medicines, herbs, non-prescription drugs, or  dietary supplements you use. Also tell them if you smoke, drink alcohol, or use illegal drugs. Some items may interact with your medicine. What should I watch for while using this medicine? Visit your doctor or health care professional for regular checkups. It may be some time before you see the benefit from this medicine. Do not stop taking your medicine unless your doctor tells you to. Your doctor may order blood tests or other tests to see how you are doing. Women should inform their doctor if they wish to become pregnant or think they might be pregnant. There is a potential for serious side effects to an unborn child. Talk to your health care professional or pharmacist for more information. You should make sure that you get enough calcium and vitamin D while you are taking this medicine. Discuss the foods you eat and the vitamins you take with your health care professional. Some people who take this medicine have severe bone, joint, and/or muscle pain. This medicine may also increase your risk for jaw problems or a broken thigh bone. Tell your doctor right away if you have severe pain in your jaw, bones, joints, or muscles. Tell your doctor if you have any pain that does not go away or that gets worse. Tell your dentist and dental surgeon that you are taking this medicine. You should not have major dental surgery while on this medicine. See your dentist to have a dental exam and fix any dental problems before starting this medicine. Take good care of your teeth while on this medicine. Make sure you see your dentist for regular follow-up appointments. What side effects may I notice from receiving this medicine? Side effects that   you should report to your doctor or health care professional as soon as possible: -allergic reactions like skin rash, itching or hives, swelling of the face, lips, or tongue -anxiety, confusion, or depression -breathing problems -changes in vision -eye pain -feeling faint or  lightheaded, falls -jaw pain, especially after dental work -mouth sores -muscle cramps, stiffness, or weakness -trouble passing urine or change in the amount of urine Side effects that usually do not require medical attention (report to your doctor or health care professional if they continue or are bothersome): -bone, joint, or muscle pain -constipation -diarrhea -fever -hair loss -irritation at site where injected -loss of appetite -nausea, vomiting -stomach upset -trouble sleeping -trouble swallowing -weak or tired This list may not describe all possible side effects. Call your doctor for medical advice about side effects. You may report side effects to FDA at 1-800-FDA-1088. Where should I keep my medicine? This drug is given in a hospital or clinic and will not be stored at home. NOTE: This sheet is a summary. It may not cover all possible information. If you have questions about this medicine, talk to your doctor, pharmacist, or health care provider.  2015, Elsevier/Gold Standard. (2013-03-20 13:03:13) Pertuzumab injection What is this medicine? PERTUZUMAB (per TOOZ ue mab) is a monoclonal antibody that targets a protein called HER2. HER2 is found in some breast cancers. This medicine can stop cancer cell growth. This medicine is used with other cancer treatments. This medicine may be used for other purposes; ask your health care provider or pharmacist if you have questions. COMMON BRAND NAME(S): PERJETA What should I tell my health care provider before I take this medicine? They need to know if you have any of these conditions: -heart disease -heart failure -high blood pressure -history of irregular heart beat -recent or ongoing radiation therapy -an unusual or allergic reaction to pertuzumab, other medicines, foods, dyes, or preservatives -pregnant or trying to get pregnant -breast-feeding How should I use this medicine? This medicine is for infusion into a vein. It is  given by a health care professional in a hospital or clinic setting. Talk to your pediatrician regarding the use of this medicine in children. Special care may be needed. Overdosage: If you think you've taken too much of this medicine contact a poison control center or emergency room at once. Overdosage: If you think you have taken too much of this medicine contact a poison control center or emergency room at once. NOTE: This medicine is only for you. Do not share this medicine with others. What if I miss a dose? It is important not to miss your dose. Call your doctor or health care professional if you are unable to keep an appointment. What may interact with this medicine? Interactions are not expected. Give your health care provider a list of all the medicines, herbs, non-prescription drugs, or dietary supplements you use. Also tell them if you smoke, drink alcohol, or use illegal drugs. Some items may interact with your medicine. This list may not describe all possible interactions. Give your health care provider a list of all the medicines, herbs, non-prescription drugs, or dietary supplements you use. Also tell them if you smoke, drink alcohol, or use illegal drugs. Some items may interact with your medicine. What should I watch for while using this medicine? Your condition will be monitored carefully while you are receiving this medicine. Report any side effects. Continue your course of treatment even though you feel ill unless your doctor tells you to stop.  Do not become pregnant while taking this medicine. Women should inform their doctor if they wish to become pregnant or think they might be pregnant. There is a potential for serious side effects to an unborn child. Talk to your health care professional or pharmacist for more information. Do not breast-feed an infant while taking this medicine. Call your doctor or health care professional for advice if you get a fever, chills or sore throat,  or other symptoms of a cold or flu. Do not treat yourself. Try to avoid being around people who are sick. You may experience fever, chills, and headache during the infusion. Report any side effects during the infusion to your health care professional. What side effects may I notice from receiving this medicine? Side effects that you should report to your doctor or health care professional as soon as possible: -breathing problems -chest pain or palpitations -dizziness -feeling faint or lightheaded -fever or chills -skin rash, itching or hives -sore throat -swelling of the face, lips, or tongue -swelling of the legs or ankles -unusually weak or tired Side effects that usually do not require medical attention (Report these to your doctor or health care professional if they continue or are bothersome.): -diarrhea -hair loss -nausea, vomiting -tiredness This list may not describe all possible side effects. Call your doctor for medical advice about side effects. You may report side effects to FDA at 1-800-FDA-1088. Where should I keep my medicine? This drug is given in a hospital or clinic and will not be stored at home. NOTE: This sheet is a summary. It may not cover all possible information. If you have questions about this medicine, talk to your doctor, pharmacist, or health care provider.  2015, Elsevier/Gold Standard. (2012-08-07 16:54:15) Ado-Trastuzumab Emtansine for injection What is this medicine? Ado-trastuzumab emtansine (ADD oh traz TOO zuh mab em TAN zine) is a monoclonal antibody combined with chemotherapy. It targets a protein called HER2. This protein is found in some stomach and breast cancers. This medicine can stop cancer cell growth. This medicine may be used for other purposes; ask your health care provider or pharmacist if you have questions. COMMON BRAND NAME(S): Kadcyla What should I tell my health care provider before I take this medicine? They need to know if you  have any of these conditions: -heart disease -heart failure -infection (especially a virus infection such as chickenpox, cold sores, or herpes) -liver disease -lung or breathing disease, like asthma -an unusual or allergic reaction to ado-trastuzumab emtansine, other medications, foods, dyes, or preservatives -pregnant or trying to get pregnant -breast-feeding How should I use this medicine? This medicine is for infusion into a vein. It is given by a health care professional in a hospital or clinic setting. Talk to your pediatrician regarding the use of this medicine in children. Special care may be needed. Overdosage: If you think you've taken too much of this medicine contact a poison control center or emergency room at once. Overdosage: If you think you have taken too much of this medicine contact a poison control center or emergency room at once. NOTE: This medicine is only for you. Do not share this medicine with others. What if I miss a dose? It is important not to miss your dose. Call your doctor or health care professional if you are unable to keep an appointment. What may interact with this medicine? This medicine may also interact with the following medications: -atazanavir -boceprevir -clarithromycin -delavirdine -indinavir -dalfopristin; quinupristin -isoniazid, INH -itraconazole -ketoconazole -nefazodone -nelfinavir -ritonavir -  telaprevir -telithromycin -tipranavir -voriconazole This list may not describe all possible interactions. Give your health care provider a list of all the medicines, herbs, non-prescription drugs, or dietary supplements you use. Also tell them if you smoke, drink alcohol, or use illegal drugs. Some items may interact with your medicine. What should I watch for while using this medicine? Visit your doctor for checks on your progress. This drug may make you feel generally unwell. This is not uncommon, as chemotherapy can affect healthy cells as  well as cancer cells. Report any side effects. Continue your course of treatment even though you feel ill unless your doctor tells you to stop. Call your doctor or health care professional for advice if you get a fever, chills or sore throat, or other symptoms of a cold or flu. Do not treat yourself. This drug decreases your body's ability to fight infections. Try to avoid being around people who are sick. Be careful brushing and flossing your teeth or using a toothpick because you may get an infection or bleed more easily. If you have any dental work done, tell your dentist you are receiving this medicine. Avoid taking products that contain aspirin, acetaminophen, ibuprofen, naproxen, or ketoprofen unless instructed by your doctor. These medicines may hide a fever. Do not become pregnant while taking this medicine. Women should inform their doctor if they wish to become pregnant or think they might be pregnant. There is a potential for serious side effects to an unborn child. [Men should inform their doctors if they wish to father a child. This medicine may lower sperm counts.] Talk to your health care professional or pharmacist for more information. Do not breast-feed an infant while taking this medicine. You may need blood work done while you are taking this medicine. What side effects may I notice from receiving this medicine? Side effects that you should report to your doctor or health care professional as soon as possible: -allergic reactions like skin rash, itching or hives, swelling of the face, lips, or tongue -breathing problems -chest pain or palpitations -fever or chills, sore throat -general ill feeling or flu-like symptoms -light-colored stools -nausea, vomiting -pain, tingling, numbness in the hands or feet -signs and symptoms of bleeding such as bloody or black, tarry stools; red or dark-brown urine; spitting up blood or brown material that looks like coffee grounds; red spots on the  skin; unusual bruising or bleeding from the eye, gums, or nose -swelling of the legs or ankles -yellowing of the eyes or skin Side effects that usually do not require medical attention (Report these to your doctor or health care professional if they continue or are bothersome.): -changes in taste -constipation -dizziness -headache -joint pain -muscle pain -trouble sleeping -unusually weak or tired This list may not describe all possible side effects. Call your doctor for medical advice about side effects. You may report side effects to FDA at 1-800-FDA-1088. Where should I keep my medicine? This drug is given in a hospital or clinic and will not be stored at home. NOTE: This sheet is a summary. It may not cover all possible information. If you have questions about this medicine, talk to your doctor, pharmacist, or health care provider.  2015, Elsevier/Gold Standard. (2013-02-18 12:55:10)

## 2014-10-09 NOTE — Progress Notes (Signed)
Hematology and Oncology Follow Up Visit  Bianca Kent [id number] 04-25-1966 48 y.o. 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 3 week     Interim History:  [name] is back for followup . She is feeling a lot better. 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 She still putting some cream on there but the skin is not open and weeping.  We did go ahead and do a PET scan on her. This showed a very nice response. There is no PET activity in the right supra clavicular region. There is no PET activity in the liver. There is decreased activity in the right axilla and breast. There is decreased activity in the spine.  She is complaining of some discomfort in the mid thoracic spine. I suspect that this is from the malignancy. Hopefully, this will continue to improve.  She's no longer working. We have her out on disability.  , In my mind, is necessary for her health. I think the fact that she's not working will allow her to focus on herself and get herself better and stronger.  She is still somewhat tired. She is on Ritalin. I will have to increase the Ritalin dose.  She does not have as much nausea vomiting. We have her on Marinol.  She's had no bleeding. Her bowels are doing okay. She's had no cough. She's had no headache.    Currently, her performance status is ECOG 1  Medications: Current outpatient prescriptions: albuterol (PROVENTIL HFA;VENTOLIN HFA) 108 (90 BASE) MCG/ACT inhaler, Inhale 2 puffs into the lungs every 4 (four) hours as needed for wheezing or shortness of breath., Disp: 2 Inhaler, Rfl: 6;  ALPRAZolam (XANAX) 1 MG tablet, Take 1 tablet (1 mg total) by mouth at bedtime as needed for anxiety., Disp: 30 tablet, Rfl: 2;  aspirin (ASPIRIN EC) 81 MG EC tablet, Take 81 mg by mouth  daily. Swallow whole., Disp: , Rfl:  Bismuth Subsalicylate (PEPTO-BISMOL PO), Take by mouth as needed., Disp: , Rfl: ;  calcium carbonate (TUMS - DOSED IN MG ELEMENTAL CALCIUM) 500 MG chewable tablet, Chew 2 tablets by mouth 2 (two) times daily., Disp: , Rfl: ;  Cholecalciferol (VITAMIN D) 2000 UNITS tablet, Take 2,000 Units by mouth daily., Disp: , Rfl:  diphenhydrAMINE (SOMINEX) 25 MG tablet, Take 100 mg by mouth at bedtime as needed for sleep. Pt states she is taking up to 200 mg Benadryl at night and still not sleeping, Disp: , Rfl: ;  dronabinol (MARINOL) 5 MG capsule, Take 1 capsule (5 mg total) by mouth 3 (three) times daily before meals., Disp: 60 capsule, Rfl: 0;  emollient (BIAFINE) cream, Apply topically as needed., Disp: , Rfl:  fentaNYL (DURAGESIC) 50 MCG/HR, Place 1 patch (50 mcg total) onto the skin every 3 (three) days., Disp: 10 patch, Rfl: 0;  granisetron (SANCUSO) 3.1 MG/24HR, APPLY TO THE SKIN EVERY 7 DAYS., Disp: 4 each, Rfl: 4;  HYDROmorphone (DILAUDID) 4 MG tablet, Take 1/2 - 1 pill, IF NEEDED, for pain every 4 hrs, Disp: 90 tablet, Rfl: 0 lidocaine-prilocaine (EMLA) cream, Apply 1 application topically as needed. Placeonto port site 49min prior to treatment and cover with plastic wrap., Disp: 30 g, Rfl: 2;  loratadine-pseudoephedrine (CLARITIN-D 24-HOUR) 10-240 MG per 24 hr tablet, Take 1 tablet by  mouth daily., Disp: , Rfl: ;  megestrol (MEGACE ES) 625 MG/5ML suspension, Take 5 mLs (625 mg total) by mouth daily., Disp: 150 mL, Rfl: 0 methylphenidate (RITALIN) 10 MG tablet, Take 1 tablet (10 mg total) by mouth 2 (two) times daily., Disp: 60 tablet, Rfl: 0;  Multiple Vitamin (MULTIVITAMIN) tablet, Take 1 tablet by mouth daily., Disp: , Rfl: ;  naproxen sodium (ANAPROX) 220 MG tablet, Take 220 mg by mouth 2 (two) times daily with a meal., Disp: , Rfl:  ondansetron (ZOFRAN) 8 MG tablet, Take 1 tablet (8 mg total) by mouth every 8 (eight) hours as needed for nausea or vomiting., Disp: 20  tablet, Rfl: 2;  pantoprazole (PROTONIX) 40 MG tablet, Take 1 tablet (40 mg total) by mouth daily., Disp: 30 tablet, Rfl: 6;  prochlorperazine (COMPAZINE) 10 MG tablet, Take 1 tablet (10 mg total) by mouth every 6 (six) hours as needed for nausea or vomiting., Disp: 60 tablet, Rfl: 2 promethazine (PHENERGAN) 25 MG suppository, Place 1 suppository (25 mg total) rectally every 6 (six) hours as needed for nausea or vomiting., Disp: 30 each, Rfl: 3;  silver sulfADIAZINE (SILVADENE) 1 % cream, Apply 1 application topically 2 (two) times daily., Disp: , Rfl: ;  traZODone (DESYREL) 100 MG tablet, Take 1-2 if needed at bed for sleep., Disp: 30 tablet, Rfl: 2  Allergies:  Allergies  Allergen Reactions  . Iohexol      Code: RASH, Desc: VERY STRONG FAMILY HX OF ANGIOEDEMA WHEN RECEIVING IV CONTRAST; PT HAS BEEN PREMEDICATED FOR OTHER CONTRASTED STUDIES(IN CATH. LAB)  KR, Onset Date: [id number]   . Prednisone Itching    Capillary beds bust  . Tetanus Toxoids Other (See Comments)    Ran a high fever for 48 hours  . Theophyllines Hives    Mental changes  . Versed [Midazolam] Other (See Comments)    Pt becomes violent    Past Medical History, Surgical history, Social history, and Family History were reviewed and updated.  Review of Systems: As above  Physical Exam:  height is 5\' 6"  (1.676 m) and weight is 178 lb (80.74 kg). Her oral temperature is 97.9 F (36.6 C). Her blood pressure is 108/78 and her pulse is 95. Her respiration is 14.   Well-developed and well-nourished white female. Head and exam shows decreased fullness in the right supraclavicular region. It is not as tender in this region. She has decreased right axillary adenopathy. She has improved range of motion of the right shoulder. Left supraclavicular region is okay. No masses are noted. Lungs are clear. Cardiac exam regular rate and rhythm with no murmurs, rubs or bruits.. Abdomen is soft. She has good bowel sounds. There is no palpable  liver or spleen tip. Back exam shows decreased tenderness over the lumbar and thoracic spine to palpation. Extremities shows no clubbing, cyanosis or edema. She has 4/5 strength in her legs. Skin exam shows improvement in the radiation dermatitis of the right breast and axilla. There is no open wound. There is some hyper pigmentation. Neurological exam is nonfocal.    Lab Results  Component Value Date   WBC 4.8 10/08/2014   HGB 14.7 10/08/2014   HCT 43.4 10/08/2014   MCV 87 10/08/2014   PLT 142* 10/08/2014     Chemistry      Component Value Date/Time   NA 140 10/08/2014 0820   NA 136 03/24/2014 1147   K 3.3 10/08/2014 0820   K 4.2 03/24/2014 1147   CL 101 10/08/2014 0820  CL 105 03/24/2014 1147   CO2 27 10/08/2014 0820   CO2 21 03/24/2014 1147   BUN 7 10/08/2014 0820   BUN 15 03/24/2014 1147   CREATININE 0.7 10/08/2014 0820   CREATININE 0.83 03/24/2014 1147      Component Value Date/Time   CALCIUM 9.0 10/08/2014 0820   CALCIUM 8.8 03/24/2014 1147   ALKPHOS 191* 10/08/2014 0820   ALKPHOS 106 03/24/2014 1147   AST 60* 10/08/2014 0820   AST 19 03/24/2014 1147   ALT 45 10/08/2014 0820   ALT 28 03/24/2014 1147   BILITOT 0.80 10/08/2014 0820   BILITOT 0.5 03/24/2014 1147         Impression and Plan: Ms. Amster is 48 year old white female with triple positive metastatic breast cancer. We now have her on Kadcyla. She tolerated herfirst 3  cycles well. It is working nicely. At have to fight with her insurance company to do the [employer] combination. I think they did approve it. The radiation dermatitis is improving nicely. I the alkaline phosphatase is still somewhat elevated. I suspect that this might be from bone lesions. We may have to consider radiation to the thoracic region if she continues to have pain issues.  Her CA 27.29 hasbeen coming down. Today, it is only 26 This has to be encouraging.  I spent about 40 minutes with she and her sister today. We had an  excellent prayer session.  We will see her back in another 3-4 weeks. She will be going down to her father's house in [place] for Christmas.   [name], MD 12/18/20155:05 PM

## 2014-10-13 ENCOUNTER — Telehealth: Payer: Self-pay | Admitting: Hematology & Oncology

## 2014-10-13 ENCOUNTER — Other Ambulatory Visit: Payer: Self-pay | Admitting: Hematology & Oncology

## 2014-10-13 DIAGNOSIS — C50911 Malignant neoplasm of unspecified site of right female breast: Secondary | ICD-10-CM

## 2014-10-13 NOTE — Telephone Encounter (Signed)
Left pt message to call [phone] for details of 12-30 appointment at [facility]

## 2014-10-13 NOTE — Telephone Encounter (Signed)
Called was placed to inform patient of echo apt on 10/21/14.  I did not get an answer on the home or cell phone.  i left a message with date/ time and mailed out calendar

## 2014-10-14 ENCOUNTER — Telehealth: Payer: Self-pay | Admitting: Hematology & Oncology

## 2014-10-14 ENCOUNTER — Other Ambulatory Visit (HOSPITAL_COMMUNITY): Payer: 59

## 2014-10-14 NOTE — Telephone Encounter (Signed)
Faxed [employer] FMLA papers today to:  F: [phone] P: [phone]   Ref: [phone]      COPY SCANNED

## 2014-10-14 NOTE — Telephone Encounter (Signed)
Patient called today and confirmed echo apt for 10/21/14.  Patient is aware of echo apt date/time and location

## 2014-10-21 ENCOUNTER — Ambulatory Visit (HOSPITAL_COMMUNITY)
Admission: RE | Admit: 2014-10-21 | Discharge: 2014-10-21 | Disposition: A | Discharge status: Home / Self Care | Payer: 59 | Source: Ambulatory Visit | Attending: Hematology & Oncology | Admitting: Hematology & Oncology

## 2014-10-21 DIAGNOSIS — C50911 Malignant neoplasm of unspecified site of right female breast: Secondary | ICD-10-CM | POA: Diagnosis not present

## 2014-10-21 DIAGNOSIS — C799 Secondary malignant neoplasm of unspecified site: Secondary | ICD-10-CM | POA: Diagnosis not present

## 2014-10-21 DIAGNOSIS — Z01818 Encounter for other preprocedural examination: Secondary | ICD-10-CM | POA: Diagnosis not present

## 2014-10-21 DIAGNOSIS — I059 Rheumatic mitral valve disease, unspecified: Secondary | ICD-10-CM

## 2014-10-21 DIAGNOSIS — Z87891 Personal history of nicotine dependence: Secondary | ICD-10-CM | POA: Insufficient documentation

## 2014-10-21 DIAGNOSIS — I340 Nonrheumatic mitral (valve) insufficiency: Secondary | ICD-10-CM | POA: Diagnosis not present

## 2014-10-21 NOTE — Progress Notes (Signed)
Echocardiogram 2D Echocardiogram has been performed.  Nickolaus Bordelon 10/21/2014, 10:24 AM

## 2014-10-22 ENCOUNTER — Encounter: Payer: Self-pay | Admitting: *Deleted

## 2014-10-29 ENCOUNTER — Ambulatory Visit (HOSPITAL_BASED_OUTPATIENT_CLINIC_OR_DEPARTMENT_OTHER): Payer: 59

## 2014-10-29 ENCOUNTER — Other Ambulatory Visit (HOSPITAL_BASED_OUTPATIENT_CLINIC_OR_DEPARTMENT_OTHER): Payer: 59 | Admitting: Lab

## 2014-10-29 ENCOUNTER — Encounter: Payer: Self-pay | Admitting: Hematology & Oncology

## 2014-10-29 ENCOUNTER — Ambulatory Visit (HOSPITAL_BASED_OUTPATIENT_CLINIC_OR_DEPARTMENT_OTHER): Payer: 59 | Admitting: Hematology & Oncology

## 2014-10-29 VITALS — BP 120/78 | HR 102 | Temp 98.4°F | Resp 16

## 2014-10-29 DIAGNOSIS — C7951 Secondary malignant neoplasm of bone: Secondary | ICD-10-CM

## 2014-10-29 DIAGNOSIS — C773 Secondary and unspecified malignant neoplasm of axilla and upper limb lymph nodes: Secondary | ICD-10-CM

## 2014-10-29 DIAGNOSIS — R110 Nausea: Secondary | ICD-10-CM

## 2014-10-29 DIAGNOSIS — C50919 Malignant neoplasm of unspecified site of unspecified female breast: Secondary | ICD-10-CM

## 2014-10-29 DIAGNOSIS — C50911 Malignant neoplasm of unspecified site of right female breast: Secondary | ICD-10-CM

## 2014-10-29 DIAGNOSIS — C50811 Malignant neoplasm of overlapping sites of right female breast: Secondary | ICD-10-CM

## 2014-10-29 DIAGNOSIS — Z5112 Encounter for antineoplastic immunotherapy: Secondary | ICD-10-CM

## 2014-10-29 DIAGNOSIS — R5383 Other fatigue: Secondary | ICD-10-CM

## 2014-10-29 DIAGNOSIS — R64 Cachexia: Secondary | ICD-10-CM

## 2014-10-29 DIAGNOSIS — C787 Secondary malignant neoplasm of liver and intrahepatic bile duct: Secondary | ICD-10-CM

## 2014-10-29 DIAGNOSIS — G4701 Insomnia due to medical condition: Secondary | ICD-10-CM

## 2014-10-29 DIAGNOSIS — J4530 Mild persistent asthma, uncomplicated: Secondary | ICD-10-CM

## 2014-10-29 DIAGNOSIS — G4700 Insomnia, unspecified: Secondary | ICD-10-CM

## 2014-10-29 LAB — CMP (CANCER CENTER ONLY)
ALK PHOS: 175 U/L — AB (ref 26–84)
ALT(SGPT): 45 U/L (ref 10–47)
AST: 75 U/L — ABNORMAL HIGH (ref 11–38)
Albumin: 3.5 g/dL (ref 3.3–5.5)
BILIRUBIN TOTAL: 0.7 mg/dL (ref 0.20–1.60)
BUN: 5 mg/dL — AB (ref 7–22)
CO2: 27 mEq/L (ref 18–33)
Calcium: 8.5 mg/dL (ref 8.0–10.3)
Chloride: 102 mEq/L (ref 98–108)
Creat: 0.6 mg/dl (ref 0.6–1.2)
Glucose, Bld: 108 mg/dL (ref 73–118)
Potassium: 3.2 mEq/L — ABNORMAL LOW (ref 3.3–4.7)
Sodium: 138 mEq/L (ref 128–145)
Total Protein: 8.3 g/dL — ABNORMAL HIGH (ref 6.4–8.1)

## 2014-10-29 LAB — CBC WITH DIFFERENTIAL (CANCER CENTER ONLY)
BASO#: 0 10*3/uL (ref 0.0–0.2)
BASO%: 0.6 % (ref 0.0–2.0)
EOS%: 1.9 % (ref 0.0–7.0)
Eosinophils Absolute: 0.1 10*3/uL (ref 0.0–0.5)
HEMATOCRIT: 43 % (ref 34.8–46.6)
HGB: 13.9 g/dL (ref 11.6–15.9)
LYMPH#: 1 10*3/uL (ref 0.9–3.3)
LYMPH%: 13.9 % — AB (ref 14.0–48.0)
MCH: 29.3 pg (ref 26.0–34.0)
MCHC: 32.3 g/dL (ref 32.0–36.0)
MCV: 91 fL (ref 81–101)
MONO#: 0.6 10*3/uL (ref 0.1–0.9)
MONO%: 8.5 % (ref 0.0–13.0)
NEUT#: 5.3 10*3/uL (ref 1.5–6.5)
NEUT%: 75.1 % (ref 39.6–80.0)
Platelets: 141 10*3/uL — ABNORMAL LOW (ref 145–400)
RBC: 4.75 10*6/uL (ref 3.70–5.32)
RDW: 16.7 % — ABNORMAL HIGH (ref 11.1–15.7)
WBC: 7 10*3/uL (ref 3.9–10.0)

## 2014-10-29 LAB — LACTATE DEHYDROGENASE: LDH: 218 U/L (ref 94–250)

## 2014-10-29 LAB — CANCER ANTIGEN 27.29: CA 27.29: 22 U/mL (ref 0–39)

## 2014-10-29 MED ORDER — SODIUM CHLORIDE 0.9 % IV SOLN
Freq: Once | INTRAVENOUS | Status: AC
  Administered 2014-10-29: 10:00:00 via INTRAVENOUS

## 2014-10-29 MED ORDER — DIPHENHYDRAMINE HCL 25 MG PO CAPS
ORAL_CAPSULE | ORAL | Status: AC
  Filled 2014-10-29: qty 2

## 2014-10-29 MED ORDER — METOCLOPRAMIDE HCL 10 MG PO TABS: 10.0000 mg | ORAL_TABLET | Freq: Four times a day (QID) | ORAL | Status: DC | PRN

## 2014-10-29 MED ORDER — SODIUM CHLORIDE 0.9 % IJ SOLN
10.0000 mL | INTRAMUSCULAR | Status: DC | PRN
  Filled 2014-10-29: qty 10

## 2014-10-29 MED ORDER — HEPARIN SOD (PORK) LOCK FLUSH 100 UNIT/ML IV SOLN
500.0000 [IU] | Freq: Once | INTRAVENOUS | Status: DC | PRN
  Filled 2014-10-29: qty 5

## 2014-10-29 MED ORDER — SODIUM CHLORIDE 0.9 % IV SOLN
420.0000 mg | Freq: Once | INTRAVENOUS | Status: AC
  Administered 2014-10-29: 420 mg via INTRAVENOUS
  Filled 2014-10-29: qty 14

## 2014-10-29 MED ORDER — DRONABINOL 10 MG PO CAPS: 10.0000 mg | ORAL_CAPSULE | Freq: Three times a day (TID) | ORAL | Status: DC

## 2014-10-29 MED ORDER — METOCLOPRAMIDE HCL 5 MG/ML IJ SOLN
20.0000 mg | Freq: Once | INTRAVENOUS | Status: AC
  Administered 2014-10-29: 20 mg via INTRAVENOUS
  Filled 2014-10-29: qty 4

## 2014-10-29 MED ORDER — ALPRAZOLAM 1 MG PO TABS: 1.0000 mg | ORAL_TABLET | Freq: Every evening | ORAL | Status: DC | PRN

## 2014-10-29 MED ORDER — SODIUM CHLORIDE 0.9 % IJ SOLN
3.0000 mL | Freq: Once | INTRAMUSCULAR | Status: DC | PRN
  Filled 2014-10-29: qty 10

## 2014-10-29 MED ORDER — PALONOSETRON HCL INJECTION 0.25 MG/5ML
INTRAVENOUS | Status: AC
  Filled 2014-10-29: qty 5

## 2014-10-29 MED ORDER — LIDOCAINE-PRILOCAINE 2.5-2.5 % EX CREA: 1.0000 "application " | TOPICAL_CREAM | CUTANEOUS | Status: DC | PRN

## 2014-10-29 MED ORDER — HEPARIN SOD (PORK) LOCK FLUSH 100 UNIT/ML IV SOLN
250.0000 [IU] | Freq: Once | INTRAVENOUS | Status: DC | PRN
  Filled 2014-10-29: qty 5

## 2014-10-29 MED ORDER — ACETAMINOPHEN 325 MG PO TABS
650.0000 mg | ORAL_TABLET | Freq: Once | ORAL | Status: AC
  Administered 2014-10-29: 650 mg via ORAL

## 2014-10-29 MED ORDER — ZOLEDRONIC ACID 4 MG/100ML IV SOLN
4.0000 mg | Freq: Once | INTRAVENOUS | Status: AC
  Administered 2014-10-29: 4 mg via INTRAVENOUS
  Filled 2014-10-29: qty 100

## 2014-10-29 MED ORDER — PALONOSETRON HCL INJECTION 0.25 MG/5ML
0.2500 mg | Freq: Once | INTRAVENOUS | Status: AC
  Administered 2014-10-29: 0.25 mg via INTRAVENOUS

## 2014-10-29 MED ORDER — DIPHENHYDRAMINE HCL 25 MG PO CAPS
50.0000 mg | ORAL_CAPSULE | Freq: Once | ORAL | Status: AC
  Administered 2014-10-29: 50 mg via ORAL

## 2014-10-29 MED ORDER — SODIUM CHLORIDE 0.9 % IV SOLN
3.6000 mg/kg | Freq: Once | INTRAVENOUS | Status: AC
  Administered 2014-10-29: 320 mg via INTRAVENOUS
  Filled 2014-10-29: qty 16

## 2014-10-29 MED ORDER — METOCLOPRAMIDE HCL 5 MG/ML IJ SOLN
INTRAMUSCULAR | Status: AC
  Filled 2014-10-29: qty 4

## 2014-10-29 MED ORDER — HYDROMORPHONE HCL 4 MG PO TABS: ORAL_TABLET | ORAL | Status: DC

## 2014-10-29 MED ORDER — ACETAMINOPHEN 325 MG PO TABS
ORAL_TABLET | ORAL | Status: AC
  Filled 2014-10-29: qty 2

## 2014-10-29 MED ORDER — ALTEPLASE 2 MG IJ SOLR
2.0000 mg | Freq: Once | INTRAMUSCULAR | Status: DC | PRN
  Filled 2014-10-29: qty 2

## 2014-10-29 NOTE — Patient Instructions (Signed)
Zoledronic Acid injection (Hypercalcemia, Oncology) What is this medicine? ZOLEDRONIC ACID (ZOE le dron ik AS id) lowers the amount of calcium loss from bone. It is used to treat too much calcium in your blood from cancer. It is also used to prevent complications of cancer that has spread to the bone. This medicine may be used for other purposes; ask your health care provider or pharmacist if you have questions. COMMON BRAND NAME(S): Zometa What should I tell my health care provider before I take this medicine? They need to know if you have any of these conditions: -aspirin-sensitive asthma -cancer, especially if you are receiving medicines used to treat cancer -dental disease or wear dentures -infection -kidney disease -receiving corticosteroids like dexamethasone or prednisone -an unusual or allergic reaction to zoledronic acid, other medicines, foods, dyes, or preservatives -pregnant or trying to get pregnant -breast-feeding How should I use this medicine? This medicine is for infusion into a vein. It is given by a health care professional in a hospital or clinic setting. Talk to your pediatrician regarding the use of this medicine in children. Special care may be needed. Overdosage: If you think you have taken too much of this medicine contact a poison control center or emergency room at once. NOTE: This medicine is only for you. Do not share this medicine with others. What if I miss a dose? It is important not to miss your dose. Call your doctor or health care professional if you are unable to keep an appointment. What may interact with this medicine? -certain antibiotics given by injection -NSAIDs, medicines for pain and inflammation, like ibuprofen or naproxen -some diuretics like bumetanide, furosemide -teriparatide -thalidomide This list may not describe all possible interactions. Give your health care provider a list of all the medicines, herbs, non-prescription drugs, or  dietary supplements you use. Also tell them if you smoke, drink alcohol, or use illegal drugs. Some items may interact with your medicine. What should I watch for while using this medicine? Visit your doctor or health care professional for regular checkups. It may be some time before you see the benefit from this medicine. Do not stop taking your medicine unless your doctor tells you to. Your doctor may order blood tests or other tests to see how you are doing. Women should inform their doctor if they wish to become pregnant or think they might be pregnant. There is a potential for serious side effects to an unborn child. Talk to your health care professional or pharmacist for more information. You should make sure that you get enough calcium and vitamin D while you are taking this medicine. Discuss the foods you eat and the vitamins you take with your health care professional. Some people who take this medicine have severe bone, joint, and/or muscle pain. This medicine may also increase your risk for jaw problems or a broken thigh bone. Tell your doctor right away if you have severe pain in your jaw, bones, joints, or muscles. Tell your doctor if you have any pain that does not go away or that gets worse. Tell your dentist and dental surgeon that you are taking this medicine. You should not have major dental surgery while on this medicine. See your dentist to have a dental exam and fix any dental problems before starting this medicine. Take good care of your teeth while on this medicine. Make sure you see your dentist for regular follow-up appointments. What side effects may I notice from receiving this medicine? Side effects that   you should report to your doctor or health care professional as soon as possible: -allergic reactions like skin rash, itching or hives, swelling of the face, lips, or tongue -anxiety, confusion, or depression -breathing problems -changes in vision -eye pain -feeling faint or  lightheaded, falls -jaw pain, especially after dental work -mouth sores -muscle cramps, stiffness, or weakness -trouble passing urine or change in the amount of urine Side effects that usually do not require medical attention (report to your doctor or health care professional if they continue or are bothersome): -bone, joint, or muscle pain -constipation -diarrhea -fever -hair loss -irritation at site where injected -loss of appetite -nausea, vomiting -stomach upset -trouble sleeping -trouble swallowing -weak or tired This list may not describe all possible side effects. Call your doctor for medical advice about side effects. You may report side effects to FDA at 1-800-FDA-1088. Where should I keep my medicine? This drug is given in a hospital or clinic and will not be stored at home. NOTE: This sheet is a summary. It may not cover all possible information. If you have questions about this medicine, talk to your doctor, pharmacist, or health care provider.  2015, [employer]vier/Gold Standard. (2013-03-20 13:03:13) Pertuzumab injection What is this medicine? PERTUZUMAB (per TOOZ ue mab) is a monoclonal antibody that targets a protein called HER2. HER2 is found in some breast cancers. This medicine can stop cancer cell growth. This medicine is used with other cancer treatments. This medicine may be used for other purposes; ask your health care provider or pharmacist if you have questions. COMMON BRAND NAME(S): PERJETA What should I tell my health care provider before I take this medicine? They need to know if you have any of these conditions: -heart disease -heart failure -high blood pressure -history of irregular heart beat -recent or ongoing radiation therapy -an unusual or allergic reaction to pertuzumab, other medicines, foods, dyes, or preservatives -pregnant or trying to get pregnant -breast-feeding How should I use this medicine? This medicine is for infusion into a vein. It is  given by a health care professional in a hospital or clinic setting. Talk to your pediatrician regarding the use of this medicine in children. Special care may be needed. Overdosage: If you think you've taken too much of this medicine contact a poison control center or emergency room at once. Overdosage: If you think you have taken too much of this medicine contact a poison control center or emergency room at once. NOTE: This medicine is only for you. Do not share this medicine with others. What if I miss a dose? It is important not to miss your dose. Call your doctor or health care professional if you are unable to keep an appointment. What may interact with this medicine? Interactions are not expected. Give your health care provider a list of all the medicines, herbs, non-prescription drugs, or dietary supplements you use. Also tell them if you smoke, drink alcohol, or use illegal drugs. Some items may interact with your medicine. This list may not describe all possible interactions. Give your health care provider a list of all the medicines, herbs, non-prescription drugs, or dietary supplements you use. Also tell them if you smoke, drink alcohol, or use illegal drugs. Some items may interact with your medicine. What should I watch for while using this medicine? Your condition will be monitored carefully while you are receiving this medicine. Report any side effects. Continue your course of treatment even though you feel ill unless your doctor tells you to stop.  Do not become pregnant while taking this medicine. Women should inform their doctor if they wish to become pregnant or think they might be pregnant. There is a potential for serious side effects to an unborn child. Talk to your health care professional or pharmacist for more information. Do not breast-feed an infant while taking this medicine. Call your doctor or health care professional for advice if you get a fever, chills or sore throat,  or other symptoms of a cold or flu. Do not treat yourself. Try to avoid being around people who are sick. You may experience fever, chills, and headache during the infusion. Report any side effects during the infusion to your health care professional. What side effects may I notice from receiving this medicine? Side effects that you should report to your doctor or health care professional as soon as possible: -breathing problems -chest pain or palpitations -dizziness -feeling faint or lightheaded -fever or chills -skin rash, itching or hives -sore throat -swelling of the face, lips, or tongue -swelling of the legs or ankles -unusually weak or tired Side effects that usually do not require medical attention (Report these to your doctor or health care professional if they continue or are bothersome.): -diarrhea -hair loss -nausea, vomiting -tiredness This list may not describe all possible side effects. Call your doctor for medical advice about side effects. You may report side effects to FDA at 1-800-FDA-1088. Where should I keep my medicine? This drug is given in a hospital or clinic and will not be stored at home. NOTE: This sheet is a summary. It may not cover all possible information. If you have questions about this medicine, talk to your doctor, pharmacist, or health care provider.  2015, Elsevier/Gold Standard. (2012-08-07 16:54:15) Ado-Trastuzumab Emtansine for injection What is this medicine? Ado-trastuzumab emtansine (ADD oh traz TOO zuh mab em TAN zine) is a monoclonal antibody combined with chemotherapy. It targets a protein called HER2. This protein is found in some stomach and breast cancers. This medicine can stop cancer cell growth. This medicine may be used for other purposes; ask your health care provider or pharmacist if you have questions. COMMON BRAND NAME(S): Kadcyla What should I tell my health care provider before I take this medicine? They need to know if you  have any of these conditions: -heart disease -heart failure -infection (especially a virus infection such as chickenpox, cold sores, or herpes) -liver disease -lung or breathing disease, like asthma -an unusual or allergic reaction to ado-trastuzumab emtansine, other medications, foods, dyes, or preservatives -pregnant or trying to get pregnant -breast-feeding How should I use this medicine? This medicine is for infusion into a vein. It is given by a health care professional in a hospital or clinic setting. Talk to your pediatrician regarding the use of this medicine in children. Special care may be needed. Overdosage: If you think you've taken too much of this medicine contact a poison control center or emergency room at once. Overdosage: If you think you have taken too much of this medicine contact a poison control center or emergency room at once. NOTE: This medicine is only for you. Do not share this medicine with others. What if I miss a dose? It is important not to miss your dose. Call your doctor or health care professional if you are unable to keep an appointment. What may interact with this medicine? This medicine may also interact with the following medications: -atazanavir -boceprevir -clarithromycin -delavirdine -indinavir -dalfopristin; quinupristin -isoniazid, INH -itraconazole -ketoconazole -nefazodone -nelfinavir -ritonavir -  telaprevir -telithromycin -tipranavir -voriconazole This list may not describe all possible interactions. Give your health care provider a list of all the medicines, herbs, non-prescription drugs, or dietary supplements you use. Also tell them if you smoke, drink alcohol, or use illegal drugs. Some items may interact with your medicine. What should I watch for while using this medicine? Visit your doctor for checks on your progress. This drug may make you feel generally unwell. This is not uncommon, as chemotherapy can affect healthy cells as  well as cancer cells. Report any side effects. Continue your course of treatment even though you feel ill unless your doctor tells you to stop. Call your doctor or health care professional for advice if you get a fever, chills or sore throat, or other symptoms of a cold or flu. Do not treat yourself. This drug decreases your body's ability to fight infections. Try to avoid being around people who are sick. Be careful brushing and flossing your teeth or using a toothpick because you may get an infection or bleed more easily. If you have any dental work done, tell your dentist you are receiving this medicine. Avoid taking products that contain aspirin, acetaminophen, ibuprofen, naproxen, or ketoprofen unless instructed by your doctor. These medicines may hide a fever. Do not become pregnant while taking this medicine. Women should inform their doctor if they wish to become pregnant or think they might be pregnant. There is a potential for serious side effects to an unborn child. [Men should inform their doctors if they wish to father a child. This medicine may lower sperm counts.] Talk to your health care professional or pharmacist for more information. Do not breast-feed an infant while taking this medicine. You may need blood work done while you are taking this medicine. What side effects may I notice from receiving this medicine? Side effects that you should report to your doctor or health care professional as soon as possible: -allergic reactions like skin rash, itching or hives, swelling of the face, lips, or tongue -breathing problems -chest pain or palpitations -fever or chills, sore throat -general ill feeling or flu-like symptoms -light-colored stools -nausea, vomiting -pain, tingling, numbness in the hands or feet -signs and symptoms of bleeding such as bloody or black, tarry stools; red or dark-brown urine; spitting up blood or brown material that looks like coffee grounds; red spots on the  skin; unusual bruising or bleeding from the eye, gums, or nose -swelling of the legs or ankles -yellowing of the eyes or skin Side effects that usually do not require medical attention (Report these to your doctor or health care professional if they continue or are bothersome.): -changes in taste -constipation -dizziness -headache -joint pain -muscle pain -trouble sleeping -unusually weak or tired This list may not describe all possible side effects. Call your doctor for medical advice about side effects. You may report side effects to FDA at 1-800-FDA-1088. Where should I keep my medicine? This drug is given in a hospital or clinic and will not be stored at home. NOTE: This sheet is a summary. It may not cover all possible information. If you have questions about this medicine, talk to your doctor, pharmacist, or health care provider.  2015, Elsevier/Gold Standard. (2013-02-18 12:55:10)

## 2014-10-30 NOTE — Progress Notes (Signed)
Hematology and Oncology Follow Up Visit  Bianca Kent [id number] Aug 21, 1966 49 y.o. 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 3 week     Interim History:  [name] is back for followup . She had a very nice Christmas. She was down visiting her father. She had a good time down there. She went fishing quite a bit. Unfortunately, she caught eels. She is not too happy about this.  She is out of work. She is on disability. I think that she probably will need long-term disability.  She's worried about some swelling and increased pain over in the right breast and axilla. She finished radiation about 6 weeks ago. I'll be surprised if she had progressive disease. However, we applied the need to check this with a CT scan.  She's having no problems with bowels or bladder. There is no cough. Her appetite has picked up a little bit.  I have also Ritalin which is helping her. She says that Reglan seems help most with her nausea. She took some over 5 there's when she was visiting him. As such, we will give her a prescription for Reglan at 10 mg 4 times a day as needed.  Her CA 27.29 has always been a little on the lower side. This still is holding steady. I think the last level was 26.    Currently, her performance status is ECOG 1  Medications: Current outpatient prescriptions: albuterol (PROVENTIL HFA;VENTOLIN HFA) 108 (90 BASE) MCG/ACT inhaler, Inhale 2 puffs into the lungs every 4 (four) hours as needed for wheezing or shortness of breath., Disp: 2 Inhaler, Rfl: 6;  ALPRAZolam (XANAX) 1 MG tablet, Take 1 tablet (1 mg total) by mouth at bedtime as needed for anxiety., Disp: 30 tablet, Rfl: 2;  aspirin (ASPIRIN EC) 81 MG EC tablet, Take 81 mg by mouth daily. Swallow whole., Disp: , Rfl:  Bismuth Subsalicylate (PEPTO-BISMOL  PO), Take by mouth as needed., Disp: , Rfl: ;  calcium carbonate (TUMS - DOSED IN MG ELEMENTAL CALCIUM) 500 MG chewable tablet, Chew 2 tablets by mouth 2 (two) times daily., Disp: , Rfl: ;  Cholecalciferol (VITAMIN D) 2000 UNITS tablet, Take 2,000 Units by mouth daily., Disp: , Rfl:  diphenhydrAMINE (SOMINEX) 25 MG tablet, Take 100 mg by mouth at bedtime as needed for sleep. Pt states she is taking up to 200 mg Benadryl at night and still not sleeping, Disp: , Rfl: ;  dronabinol (MARINOL) 10 MG capsule, Take 1 capsule (10 mg total) by mouth 3 (three) times daily before meals., Disp: 90 capsule, Rfl: 0;  emollient (BIAFINE) cream, Apply topically as needed., Disp: , Rfl:  fentaNYL (DURAGESIC) 50 MCG/HR, Place 1 patch (50 mcg total) onto the skin every 3 (three) days., Disp: 10 patch, Rfl: 0;  granisetron (SANCUSO) 3.1 MG/24HR, APPLY TO THE SKIN EVERY 7 DAYS., Disp: 4 each, Rfl: 4;  HYDROmorphone (DILAUDID) 4 MG tablet, Take 1/2 - 1 pill, IF NEEDED, for pain every 4 hrs, Disp: 90 tablet, Rfl: 0 lidocaine-prilocaine (EMLA) cream, Apply 1 application topically as needed. Placeonto port site 84min prior to treatment and cover with plastic wrap., Disp: 30 g, Rfl: 2;  loratadine-pseudoephedrine (CLARITIN-D 24-HOUR) 10-240 MG per 24 hr tablet, Take 1 tablet by mouth daily., Disp: , Rfl: ;  megestrol (MEGACE ES) 625  MG/5ML suspension, Take 5 mLs (625 mg total) by mouth daily., Disp: 150 mL, Rfl: 0 methylphenidate (RITALIN) 10 MG tablet, Take 1 tablet (10 mg total) by mouth 2 (two) times daily., Disp: 60 tablet, Rfl: 0;  metoCLOPramide (REGLAN) 10 MG tablet, Take 1 tablet (10 mg total) by mouth every 6 (six) hours as needed for nausea., Disp: 120 tablet, Rfl: 5;  Multiple Vitamin (MULTIVITAMIN) tablet, Take 1 tablet by mouth daily., Disp: , Rfl:  naproxen sodium (ANAPROX) 220 MG tablet, Take 220 mg by mouth 2 (two) times daily with a meal., Disp: , Rfl: ;  ondansetron (ZOFRAN) 8 MG tablet, Take 1 tablet (8 mg total) by  mouth every 8 (eight) hours as needed for nausea or vomiting., Disp: 20 tablet, Rfl: 2;  pantoprazole (PROTONIX) 40 MG tablet, Take 1 tablet (40 mg total) by mouth daily., Disp: 30 tablet, Rfl: 6 prochlorperazine (COMPAZINE) 10 MG tablet, Take 1 tablet (10 mg total) by mouth every 6 (six) hours as needed for nausea or vomiting., Disp: 60 tablet, Rfl: 2;  promethazine (PHENERGAN) 25 MG suppository, Place 1 suppository (25 mg total) rectally every 6 (six) hours as needed for nausea or vomiting., Disp: 30 each, Rfl: 3;  silver sulfADIAZINE (SILVADENE) 1 % cream, Apply 1 application topically 2 (two) times daily., Disp: , Rfl:  traZODone (DESYREL) 100 MG tablet, Take 1-2 if needed at bed for sleep., Disp: 30 tablet, Rfl: 2  Allergies:  Allergies  Allergen Reactions  . Iohexol      Code: RASH, Desc: VERY STRONG FAMILY HX OF ANGIOEDEMA WHEN RECEIVING IV CONTRAST; PT HAS BEEN PREMEDICATED FOR OTHER CONTRASTED STUDIES(IN CATH. LAB)  KR, Onset Date: [id number]   . Prednisone Itching    Capillary beds bust  . Tetanus Toxoids Other (See Comments)    Ran a high fever for 48 hours  . Theophyllines Hives    Mental changes  . Versed [Midazolam] Other (See Comments)    Pt becomes violent    Past Medical History, Surgical history, Social history, and Family History were reviewed and updated.  Review of Systems: As above  Physical Exam:  oral temperature is 98.4 F (36.9 C). Her blood pressure is 120/78 and her pulse is 102. Her respiration is 16.   Well-developed and well-nourished white female. Head and exam shows decreased fullness in the right supraclavicular region. It is not as tender in this region. She has decreased right axillary adenopathy. She has improved range of motion of the right shoulder. Left supraclavicular region is okay. No masses are noted. Lungs are clear. Cardiac exam regular rate and rhythm with no murmurs, rubs or bruits.. Abdomen is soft. She has good bowel sounds. There is no  palpable liver or spleen tip. Back exam shows decreased tenderness over the lumbar and thoracic spine to palpation. Extremities shows no clubbing, cyanosis or edema. She has 4/5 strength in her legs. Skin exam shows improvement in the radiation dermatitis of the right breast and axilla. There is no open wound. There is some hyper pigmentation. Neurological exam is nonfocal.    Lab Results  Component Value Date   WBC 7.0 10/29/2014   HGB 13.9 10/29/2014   HCT 43.0 10/29/2014   MCV 91 10/29/2014   PLT 141* 10/29/2014     Chemistry      Component Value Date/Time   NA 138 10/29/2014 0814   NA 136 03/24/2014 1147   K 3.2* 10/29/2014 0814   K 4.2 03/24/2014 1147   CL 102 10/29/2014  0814   CL 105 03/24/2014 1147   CO2 27 10/29/2014 0814   CO2 21 03/24/2014 1147   BUN 5* 10/29/2014 0814   BUN 15 03/24/2014 1147   CREATININE 0.6 10/29/2014 0814   CREATININE 0.83 03/24/2014 1147      Component Value Date/Time   CALCIUM 8.5 10/29/2014 0814   CALCIUM 8.8 03/24/2014 1147   ALKPHOS 175* 10/29/2014 0814   ALKPHOS 106 03/24/2014 1147   AST 75* 10/29/2014 0814   AST 19 03/24/2014 1147   ALT 45 10/29/2014 0814   ALT 28 03/24/2014 1147   BILITOT 0.70 10/29/2014 0814   BILITOT 0.5 03/24/2014 1147         Impression and Plan: Bianca Kent is 49 year old white female with triple positive metastatic breast cancer. We now have her on Kadcyla. She tolerated her first 4  cycles well. It is working nicely.   We will set her up with a CT scan. We'll see what this shows. I think if her do fine progressive disease, then we will actually have to do start her on chemotherapy. I try to avoid chemotherapy because she does not want lose her hair. However, we may not have a choice.  I spent about 40 minutes with she and her sister today. We had an excellent prayer session.  We will see her back in another 3-4 weeks.    [name], MD 1/8/20167:40 AM

## 2014-11-02 ENCOUNTER — Ambulatory Visit (HOSPITAL_COMMUNITY)
Admission: RE | Admit: 2014-11-02 | Discharge: 2014-11-02 | Disposition: A | Discharge status: Home / Self Care | Payer: 59 | Source: Ambulatory Visit | Attending: Hematology & Oncology | Admitting: Hematology & Oncology

## 2014-11-02 ENCOUNTER — Encounter (HOSPITAL_COMMUNITY): Payer: Self-pay

## 2014-11-02 DIAGNOSIS — R5383 Other fatigue: Secondary | ICD-10-CM

## 2014-11-02 DIAGNOSIS — R64 Cachexia: Secondary | ICD-10-CM

## 2014-11-02 DIAGNOSIS — C50911 Malignant neoplasm of unspecified site of right female breast: Secondary | ICD-10-CM

## 2014-11-02 DIAGNOSIS — R918 Other nonspecific abnormal finding of lung field: Secondary | ICD-10-CM | POA: Diagnosis not present

## 2014-11-02 DIAGNOSIS — G4701 Insomnia due to medical condition: Secondary | ICD-10-CM

## 2014-11-02 DIAGNOSIS — R599 Enlarged lymph nodes, unspecified: Secondary | ICD-10-CM | POA: Insufficient documentation

## 2014-11-02 MED ORDER — IOHEXOL 300 MG/ML  SOLN
80.0000 mL | Freq: Once | INTRAMUSCULAR | Status: AC | PRN
  Administered 2014-11-02: 80 mL via INTRAVENOUS

## 2014-11-03 ENCOUNTER — Telehealth: Payer: Self-pay | Admitting: *Deleted

## 2014-11-03 NOTE — Telephone Encounter (Addendum)
Relayed message to patient. She appreciates the news  ----- Message from [name], MD sent at 11/02/2014  5:57 PM EST ----- Please call and let her know that the CT scan looks pretty stable. All the radiologist sees is one tiny lymph node measuring 9 mm which might be a little larger than previous. Everything else looks stable. She has some radiation changes in her lung. Overall, I really think that the breast cancer looks about the same. I don't think we have to make a change to actual chemotherapy yet.  I tried calling her but her answering machine is full and it would not let me leave a message.  Thanks

## 2014-11-10 ENCOUNTER — Telehealth: Payer: Self-pay | Admitting: Hematology & Oncology

## 2014-11-10 NOTE — Telephone Encounter (Signed)
Faxed [employer] [facility] DISABILITY papers today to:  F: [phone] P: [phone]         COPY SCANNED

## 2014-11-19 ENCOUNTER — Other Ambulatory Visit: Payer: Self-pay | Admitting: *Deleted

## 2014-11-19 ENCOUNTER — Ambulatory Visit (HOSPITAL_BASED_OUTPATIENT_CLINIC_OR_DEPARTMENT_OTHER): Payer: 59 | Admitting: Hematology & Oncology

## 2014-11-19 ENCOUNTER — Other Ambulatory Visit (HOSPITAL_BASED_OUTPATIENT_CLINIC_OR_DEPARTMENT_OTHER): Payer: 59 | Admitting: Lab

## 2014-11-19 ENCOUNTER — Ambulatory Visit (HOSPITAL_BASED_OUTPATIENT_CLINIC_OR_DEPARTMENT_OTHER): Payer: 59

## 2014-11-19 VITALS — BP 113/76 | HR 82 | Temp 98.0°F | Resp 16 | Ht 66.0 in | Wt 165.0 lb

## 2014-11-19 DIAGNOSIS — C50911 Malignant neoplasm of unspecified site of right female breast: Secondary | ICD-10-CM

## 2014-11-19 DIAGNOSIS — R5383 Other fatigue: Secondary | ICD-10-CM

## 2014-11-19 DIAGNOSIS — R64 Cachexia: Secondary | ICD-10-CM

## 2014-11-19 DIAGNOSIS — C799 Secondary malignant neoplasm of unspecified site: Secondary | ICD-10-CM

## 2014-11-19 DIAGNOSIS — G4701 Insomnia due to medical condition: Secondary | ICD-10-CM

## 2014-11-19 DIAGNOSIS — C7951 Secondary malignant neoplasm of bone: Secondary | ICD-10-CM

## 2014-11-19 DIAGNOSIS — N390 Urinary tract infection, site not specified: Secondary | ICD-10-CM

## 2014-11-19 LAB — CBC WITH DIFFERENTIAL (CANCER CENTER ONLY)
BASO#: 0 10*3/uL (ref 0.0–0.2)
BASO%: 0.4 % (ref 0.0–2.0)
EOS%: 1.4 % (ref 0.0–7.0)
Eosinophils Absolute: 0.1 10*3/uL (ref 0.0–0.5)
HCT: 45 % (ref 34.8–46.6)
HEMOGLOBIN: 14.9 g/dL (ref 11.6–15.9)
LYMPH#: 1.3 10*3/uL (ref 0.9–3.3)
LYMPH%: 13.3 % — AB (ref 14.0–48.0)
MCH: 28.9 pg (ref 26.0–34.0)
MCHC: 33.1 g/dL (ref 32.0–36.0)
MCV: 87 fL (ref 81–101)
MONO#: 0.8 10*3/uL (ref 0.1–0.9)
MONO%: 8.7 % (ref 0.0–13.0)
NEUT#: 7.3 10*3/uL — ABNORMAL HIGH (ref 1.5–6.5)
NEUT%: 76.2 % (ref 39.6–80.0)
Platelets: 167 10*3/uL (ref 145–400)
RBC: 5.15 10*6/uL (ref 3.70–5.32)
RDW: 17.7 % — AB (ref 11.1–15.7)
WBC: 9.6 10*3/uL (ref 3.9–10.0)

## 2014-11-19 LAB — CMP (CANCER CENTER ONLY)
ALT(SGPT): 27 U/L (ref 10–47)
AST: 54 U/L — ABNORMAL HIGH (ref 11–38)
Albumin: 3.7 g/dL (ref 3.3–5.5)
Alkaline Phosphatase: 256 U/L — ABNORMAL HIGH (ref 26–84)
BUN, Bld: 9 mg/dL (ref 7–22)
CO2: 26 mEq/L (ref 18–33)
CREATININE: 0.6 mg/dL (ref 0.6–1.2)
Calcium: 9.3 mg/dL (ref 8.0–10.3)
Chloride: 94 mEq/L — ABNORMAL LOW (ref 98–108)
GLUCOSE: 87 mg/dL (ref 73–118)
POTASSIUM: 3 meq/L — AB (ref 3.3–4.7)
Sodium: 131 mEq/L (ref 128–145)
Total Bilirubin: 0.7 mg/dl (ref 0.20–1.60)
Total Protein: 8.3 g/dL — ABNORMAL HIGH (ref 6.4–8.1)

## 2014-11-19 LAB — VITAMIN D 25 HYDROXY (VIT D DEFICIENCY, FRACTURES): Vit D, 25-Hydroxy: 20 ng/mL — ABNORMAL LOW (ref 30–100)

## 2014-11-19 LAB — CANCER ANTIGEN 27.29: CA 27.29: 32 U/mL (ref 0–39)

## 2014-11-19 LAB — LACTATE DEHYDROGENASE: LDH: 184 U/L (ref 94–250)

## 2014-11-19 MED ORDER — SODIUM CHLORIDE 0.9 % IV SOLN
INTRAVENOUS | Status: DC
Start: 2014-11-19 — End: 2014-11-19
  Administered 2014-11-19: 16:00:00 via INTRAVENOUS

## 2014-11-19 MED ORDER — LIDOCAINE VISCOUS 2 % MT SOLN: 20.0000 mL | OROMUCOSAL | Status: DC | PRN

## 2014-11-19 MED ORDER — CYANOCOBALAMIN 1000 MCG/ML IJ SOLN
INTRAMUSCULAR | Status: AC
  Filled 2014-11-19: qty 1

## 2014-11-19 MED ORDER — ZOLEDRONIC ACID 4 MG/100ML IV SOLN
4.0000 mg | Freq: Once | INTRAVENOUS | Status: AC
  Administered 2014-11-19: 4 mg via INTRAVENOUS
  Filled 2014-11-19: qty 100

## 2014-11-19 MED ORDER — CYANOCOBALAMIN 1000 MCG/ML IJ SOLN
1000.0000 ug | Freq: Once | INTRAMUSCULAR | Status: AC
  Administered 2014-11-19: 1000 ug via INTRAMUSCULAR

## 2014-11-19 MED ORDER — SODIUM CHLORIDE 0.9 % IV SOLN
INTRAVENOUS | Status: AC
  Administered 2014-11-19: 13:00:00 via INTRAVENOUS

## 2014-11-19 MED ORDER — PROCHLORPERAZINE EDISYLATE 5 MG/ML IJ SOLN
10.0000 mg | Freq: Once | INTRAMUSCULAR | Status: AC
  Administered 2014-11-19: 10 mg via INTRAVENOUS

## 2014-11-19 MED ORDER — SULFAMETHOXAZOLE-TRIMETHOPRIM 800-160 MG PO TABS: 1.0000 | ORAL_TABLET | Freq: Two times a day (BID) | ORAL | Status: DC

## 2014-11-19 MED ORDER — LEVOFLOXACIN IN D5W 750 MG/150ML IV SOLN
750.0000 mg | Freq: Once | INTRAVENOUS | Status: AC
  Administered 2014-11-19: 750 mg via INTRAVENOUS
  Filled 2014-11-19: qty 150

## 2014-11-19 MED ORDER — ERGOCALCIFEROL 1.25 MG (50000 UT) PO CAPS: 50000.0000 [IU] | ORAL_CAPSULE | ORAL | Status: DC

## 2014-11-19 MED ORDER — HEPARIN SOD (PORK) LOCK FLUSH 100 UNIT/ML IV SOLN
500.0000 [IU] | INTRAVENOUS | Status: AC | PRN
  Administered 2014-11-19: 500 [IU]
  Filled 2014-11-19: qty 5

## 2014-11-19 MED ORDER — SODIUM CHLORIDE 0.9 % IJ SOLN
10.0000 mL | INTRAMUSCULAR | Status: AC | PRN
  Administered 2014-11-19: 10 mL
  Filled 2014-11-19: qty 10

## 2014-11-19 NOTE — Progress Notes (Signed)
Hematology and Oncology Follow Up Visit  Bianca Kent [id number] 04/27/1966 49 y.o. 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 3 week     Interim History:  [name] is back for followup . She's not feeling well at all. She's not felt well over the past week or so. She's not eating. She just is nauseated. She thinks she may have a urinary tract infection. She's had some intermittent abdominal pain. She's had low-grade fever. She's had a little bit of a cough. She's had no rashes. There's been no leg swelling.  Her last CA 27.29 was 32. This is up a little bit from previous level of 22.  She is not having too many problems with pain. We've adjusted her pain regimen.  She says her tongue is very sore. She says that a layer skin came off her tongue recently. Because this, it is very hard for her to eat and swallow.  She clearly is dehydrated. We will have to give her some IV fluid.  She's had no bleeding. She now is out of work. There's also no way that she can work given her disease and it kind of therapy she is getting.   Currently, her performance status is ECOG 1  Medications:  Current outpatient prescriptions:  .  albuterol (PROVENTIL HFA;VENTOLIN HFA) 108 (90 BASE) MCG/ACT inhaler, Inhale 2 puffs into the lungs every 4 (four) hours as needed for wheezing or shortness of breath., Disp: 2 Inhaler, Rfl: 6 .  ALPRAZolam (XANAX) 1 MG tablet, Take 1 tablet (1 mg total) by mouth at bedtime as needed for anxiety., Disp: 30 tablet, Rfl: 2 .  aspirin (ASPIRIN EC) 81 MG EC tablet, Take 81 mg by mouth daily. Swallow whole., Disp: , Rfl:  .  Bismuth Subsalicylate (PEPTO-BISMOL PO), Take by mouth as needed., Disp: , Rfl:  .  calcium carbonate (TUMS - DOSED IN MG ELEMENTAL CALCIUM) 500 MG chewable tablet, Chew 2 tablets by  mouth 2 (two) times daily., Disp: , Rfl:  .  Cholecalciferol (VITAMIN D) 2000 UNITS tablet, Take 2,000 Units by mouth daily., Disp: , Rfl:  .  diphenhydrAMINE (SOMINEX) 25 MG tablet, Take 100 mg by mouth at bedtime as needed for sleep. Pt states she is taking up to 200 mg Benadryl at night and still not sleeping, Disp: , Rfl:  .  dronabinol (MARINOL) 10 MG capsule, Take 1 capsule (10 mg total) by mouth 3 (three) times daily before meals., Disp: 90 capsule, Rfl: 0 .  emollient (BIAFINE) cream, Apply topically as needed., Disp: , Rfl:  .  fentaNYL (DURAGESIC) 50 MCG/HR, Place 1 patch (50 mcg total) onto the skin every 3 (three) days., Disp: 10 patch, Rfl: 0 .  granisetron (SANCUSO) 3.1 MG/24HR, APPLY TO THE SKIN EVERY 7 DAYS., Disp: 4 each, Rfl: 4 .  HYDROmorphone (DILAUDID) 4 MG tablet, Take 1/2 - 1 pill, IF NEEDED, for pain every 4 hrs, Disp: 90 tablet, Rfl: 0 .  lidocaine-prilocaine (EMLA) cream, Apply 1 application topically as needed. Placeonto port site 67min prior to treatment and cover with plastic wrap., Disp: 30 g, Rfl: 2 .  megestrol (MEGACE ES) 625 MG/5ML suspension, Take 5 mLs (625 mg total) by mouth daily., Disp: 150 mL, Rfl: 0 .  methylphenidate (RITALIN) 10 MG tablet, Take 1 tablet (  10 mg total) by mouth 2 (two) times daily., Disp: 60 tablet, Rfl: 0 .  metoCLOPramide (REGLAN) 10 MG tablet, Take 1 tablet (10 mg total) by mouth every 6 (six) hours as needed for nausea., Disp: 120 tablet, Rfl: 5 .  Multiple Vitamin (MULTIVITAMIN) tablet, Take 1 tablet by mouth daily., Disp: , Rfl:  .  naproxen sodium (ANAPROX) 220 MG tablet, Take 220 mg by mouth 2 (two) times daily with a meal., Disp: , Rfl:  .  ondansetron (ZOFRAN) 8 MG tablet, Take 1 tablet (8 mg total) by mouth every 8 (eight) hours as needed for nausea or vomiting., Disp: 20 tablet, Rfl: 2 .  pantoprazole (PROTONIX) 40 MG tablet, Take 1 tablet (40 mg total) by mouth daily., Disp: 30 tablet, Rfl: 6 .  promethazine (PHENERGAN) 25 MG  suppository, Place 1 suppository (25 mg total) rectally every 6 (six) hours as needed for nausea or vomiting., Disp: 30 each, Rfl: 3 .  silver sulfADIAZINE (SILVADENE) 1 % cream, Apply 1 application topically 2 (two) times daily., Disp: , Rfl:  .  traZODone (DESYREL) 100 MG tablet, Take 1-2 if needed at bed for sleep., Disp: 30 tablet, Rfl: 2 .  loratadine-pseudoephedrine (CLARITIN-D 24-HOUR) 10-240 MG per 24 hr tablet, Take 1 tablet by mouth daily., Disp: , Rfl:  .  prochlorperazine (COMPAZINE) 10 MG tablet, Take 1 tablet (10 mg total) by mouth every 6 (six) hours as needed for nausea or vomiting. (Patient not taking: Reported on 11/19/2014), Disp: 60 tablet, Rfl: 2 No current facility-administered medications for this visit.  Facility-Administered Medications Ordered in Other Visits:  .  0.9 %  sodium chloride infusion, , Intravenous, Continuous, [name], MD, Last Rate: 150 mL/hr at 11/19/14 1544  Allergies:  Allergies  Allergen Reactions  . Iohexol      Code: RASH, Desc: VERY STRONG FAMILY HX OF ANGIOEDEMA WHEN RECEIVING IV CONTRAST; PT HAS BEEN PREMEDICATED FOR OTHER CONTRASTED STUDIES(IN CATH. LAB)  KR, Onset Date: [id number]   . Prednisone Itching    Capillary beds bust  . Tetanus Toxoids Other (See Comments)    Ran a high fever for 48 hours  . Theophyllines Hives    Mental changes  . Versed [Midazolam] Other (See Comments)    Pt becomes violent    Past Medical History, Surgical history, Social history, and Family History were reviewed and updated.  Review of Systems: As above  Physical Exam:  height is 5\' 6"  (1.676 m) and weight is 165 lb (74.844 kg). Her oral temperature is 98 F (36.7 C). Her blood pressure is 113/76 and her pulse is 82. Her respiration is 16.   Well-developed and well-nourished white female. Head and exam shows decreased fullness in the right supraclavicular region. It is not as tender in this region. She has decreased right axillary adenopathy. She  has improved range of motion of the right shoulder. Left supraclavicular region is okay. No masses are noted. Lungs are clear. Cardiac exam regular rate and rhythm with no murmurs, rubs or bruits.. Abdomen is soft. She has good bowel sounds. There is no palpable liver or spleen tip. Back exam shows decreased tenderness over the lumbar and thoracic spine to palpation. Extremities shows no clubbing, cyanosis or edema. She has 4/5 strength in her legs. Skin exam shows improvement in the radiation dermatitis of the right breast and axilla. There is no open wound. There is some hyper pigmentation. Neurological exam is nonfocal.    Lab Results  Component Value Date  WBC 9.6 11/19/2014   HGB 14.9 11/19/2014   HCT 45.0 11/19/2014   MCV 87 11/19/2014   PLT 167 11/19/2014     Chemistry      Component Value Date/Time   NA 131 11/19/2014 1119   NA 136 03/24/2014 1147   K 3.0* 11/19/2014 1119   K 4.2 03/24/2014 1147   CL 94* 11/19/2014 1119   CL 105 03/24/2014 1147   CO2 26 11/19/2014 1119   CO2 21 03/24/2014 1147   BUN 9 11/19/2014 1119   BUN 15 03/24/2014 1147   CREATININE 0.6 11/19/2014 1119   CREATININE 0.83 03/24/2014 1147      Component Value Date/Time   CALCIUM 9.3 11/19/2014 1119   CALCIUM 8.8 03/24/2014 1147   ALKPHOS 256* 11/19/2014 1119   ALKPHOS 106 03/24/2014 1147   AST 54* 11/19/2014 1119   AST 19 03/24/2014 1147   ALT 27 11/19/2014 1119   ALT 28 03/24/2014 1147   BILITOT 0.70 11/19/2014 1119   BILITOT 0.5 03/24/2014 1147         Impression and Plan: Bianca Kent is 49 year old white female with triple positive metastatic breast cancer. We now have her on Kadcyla. She tolerated her first 4  cycles well.   We will have to hold her chemotherapy today. I still think that she is able to take treatment given her condition of not feeling too well.  She definitely needs IV fluids. She also needs some IV antibiotics. I will give her some Levaquin. I will then put her on  some oral Levaquin.  We are trying to get a urine specimen on her. This is been very hard. She is not able to give [place] a specimen. As such, we will treat her empirically.  I will give her viscous lidocaine to try to help with her mouth. Hopefully, this will help her swallow a little better.  She is on Marinol. We will keep her on Marinol. We will have her come back next week. Hopefully if she is doing better, we will treat her.  I have noted that her alkaline phosphatase is going up a little bit more. This might be some that were going to have to watch closely.  I spent about 35 minutes with she and her sister. As always, we had a great prayer session. [name], MD 1/28/20164:37 PM

## 2014-11-19 NOTE — Patient Instructions (Signed)
Levofloxacin injection What is this medicine? LEVOFLOXACIN (lee voe FLOX a sin) is a quinolone antibiotic. It is used to treat certain kinds of bacterial infections. It will not work for colds, flu, or other viral infections. This medicine may be used for other purposes; ask your health care provider or pharmacist if you have questions. COMMON BRAND NAME(S): Levaquin What should I tell my health care provider before I take this medicine? They need to know if you have any of these conditions: -bone problems -cerebral disease -history of low levels of potassium in the blood -irregular heartbeat -joint problems -kidney disease -myasthenia gravis -seizure disorder -tendon problems -an unusual or allergic reaction to levofloxacin, other quinolone antibiotics, foods, dyes, or preservatives -pregnant or trying to get pregnant -breast-feeding How should I use this medicine? This medicine is for infusion into a vein. It is usually given by a health care professional in a hospital or clinic setting. If you get this medicine at home, you will be taught how to prepare and give this medicine. Use exactly as directed. Take your medicine at regular intervals. Do not take your medicine more often than directed. It is important that you put your used needles and syringes in a special sharps container. Do not put them in a trash can. If you do not have a sharps container, call your pharmacist or healthcare provider to get one. A special MedGuide will be given to you by the pharmacist with each prescription and refill. Be sure to read this information carefully each time. Talk to your pediatrician regarding the use of this medicine in children. While this drug may be prescribed for children as young as 6 months for selected conditions, precautions do apply. Overdosage: If you think you have taken too much of this medicine contact a poison control center or emergency room at once. NOTE: This medicine is only  for you. Do not share this medicine with others. What if I miss a dose? If you miss a dose, use it as soon as you can. If it is almost time for your next dose, use only that dose. Do not use double or extra doses. What may interact with this medicine? Do not take this medicine with any of the following medications - arsenic trioxide - chloroquine - droperidol - medications for irregular rhythm like amiodarone, disopyramide, dofetilide, flecainide, quinidine, procainamide, sotalol - some medicines for depression or mental problems like phenothiazines, pimozide, and ziprasidone This medicine may also interact with the following medications - amoxapine - birth control pills - cisapride - haloperidol -NSAIDS, medicines for pain and inflammation, like ibuprofen or naproxen - retinoid products like tretinoin or isotretinoin - risperidone - some other antibiotics like clarithromycin or erythromycin - theophylline - warfarin This list may not describe all possible interactions. Give your health care provider a list of all the medicines, herbs, non-prescription drugs, or dietary supplements you use. Also tell them if you smoke, drink alcohol, or use illegal drugs. Some items may interact with your medicine. What should I watch for while using this medicine? Tell your doctor or health care professional if your symptoms do not begin to improve in a few days. Drink several glasses of water a day and cut down on drinks that contain caffeine. You must not get dehydrated while taking this medicine. You may get drowsy or dizzy. Do not drive, use machinery, or do anything that needs mental alertness until you know how this medicine affects you. Do not sit or stand up quickly, especially   if you are an older patient. This reduces the risk of dizzy or fainting spells. This medicine can make you more sensitive to the sun. Keep out of the sun. If you cannot avoid being in the sun, wear protective  clothing and use a sunscreen. Do not use sun lamps or tanning beds/booths. Contact your doctor if you get a sunburn. If you are a diabetic monitor your blood glucose carefully. If you get an unusual reading stop taking this medicine and call your doctor right away. Do not treat diarrhea with over-the-counter products. Contact your doctor if you have diarrhea that lasts more than 2 days or if the diarrhea is severe and watery. What side effects may I notice from receiving this medicine? Side effects that you should report to your doctor or health care professional as soon as possible: -allergic reactions like skin rash or hives, swelling of the face, lips, or tongue -changes in vision -confusion, nightmares or hallucinations -difficulty breathing -irregular heartbeat, chest pain -joint, muscle or tendon pain -pain or difficulty passing urine -persistent headache with or without blurred vision -redness, blistering, peeling or loosening of the skin, including inside the mouth< -seizures -unusual pain, numbness, tingling, or weakness -vaginal irritation, discharge Side effects that usually do not require medical attention (report to your doctor or health care professional if they continue or are bothersome): -diarrhea -dry mouth -headache -pain, irritation at the site of injection -stomach upset, nausea -trouble sleeping This list may not describe all possible side effects. Call your doctor for medical advice about side effects. You may report side effects to FDA at 1-800-FDA-1088. Where should I keep my medicine? Keep out of the reach of children. If you are using this medicine at home, you will be instructed on how to store this medicine. Throw away any unused medicine after the expiration date on the label. NOTE: This sheet is a summary. It may not cover all possible information. If you have questions about this medicine, talk to your doctor, pharmacist, or health care provider.  2015,  [employer]. (2013-06-12 10:54:33)

## 2014-11-20 ENCOUNTER — Telehealth: Payer: Self-pay | Admitting: *Deleted

## 2014-11-20 NOTE — Telephone Encounter (Signed)
Spoke with patient. She feels better from yesterday. She states she is now urinating appropriately and has been able to eat and drink. She feels like she is okay for the weekend and doesn't need any further follow up before the weekend. She was told to call the on-call service this weekend if she was to decline or need to be assessed. She understood. Relayed update to Dr [name].

## 2014-11-25 ENCOUNTER — Telehealth: Payer: Self-pay | Admitting: *Deleted

## 2014-11-25 NOTE — Telephone Encounter (Signed)
Message left late on voice mail - retrieved this am. Patient states she is vomiting without nausea. She has been coughing, and the coughing triggers the vomiting. She was called back and she states that she feels better this morning. She was able to drink last night and keep it down. She has an appointment scheduled for tomorrow (11/26/14). 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 She was told that if her vomiting returned, or she felt like she did want to come in for assessment/fluids to call us back. She was in agreement.

## 2014-11-26 ENCOUNTER — Telehealth: Payer: Self-pay | Admitting: Hematology & Oncology

## 2014-11-26 ENCOUNTER — Ambulatory Visit
Admission: RE | Admit: 2014-11-26 | Discharge: 2014-11-26 | Disposition: A | Discharge status: Home / Self Care | Payer: 59 | Source: Ambulatory Visit | Attending: Family | Admitting: Family

## 2014-11-26 ENCOUNTER — Ambulatory Visit (HOSPITAL_BASED_OUTPATIENT_CLINIC_OR_DEPARTMENT_OTHER): Payer: 59

## 2014-11-26 ENCOUNTER — Encounter: Payer: Self-pay | Admitting: Family

## 2014-11-26 ENCOUNTER — Ambulatory Visit (HOSPITAL_BASED_OUTPATIENT_CLINIC_OR_DEPARTMENT_OTHER): Payer: 59 | Admitting: Family

## 2014-11-26 ENCOUNTER — Other Ambulatory Visit (HOSPITAL_BASED_OUTPATIENT_CLINIC_OR_DEPARTMENT_OTHER): Payer: 59 | Admitting: Lab

## 2014-11-26 DIAGNOSIS — C50811 Malignant neoplasm of overlapping sites of right female breast: Secondary | ICD-10-CM

## 2014-11-26 DIAGNOSIS — R42 Dizziness and giddiness: Secondary | ICD-10-CM

## 2014-11-26 DIAGNOSIS — C799 Secondary malignant neoplasm of unspecified site: Secondary | ICD-10-CM

## 2014-11-26 DIAGNOSIS — R5382 Chronic fatigue, unspecified: Secondary | ICD-10-CM

## 2014-11-26 DIAGNOSIS — C50911 Malignant neoplasm of unspecified site of right female breast: Secondary | ICD-10-CM

## 2014-11-26 DIAGNOSIS — Z170 Estrogen receptor positive status [ER+]: Secondary | ICD-10-CM

## 2014-11-26 DIAGNOSIS — R5383 Other fatigue: Secondary | ICD-10-CM

## 2014-11-26 DIAGNOSIS — C50919 Malignant neoplasm of unspecified site of unspecified female breast: Secondary | ICD-10-CM

## 2014-11-26 DIAGNOSIS — Z5112 Encounter for antineoplastic immunotherapy: Secondary | ICD-10-CM

## 2014-11-26 HISTORY — DX: Secondary malignant neoplasm of unspecified site: C79.9

## 2014-11-26 HISTORY — DX: Malignant neoplasm of unspecified site of unspecified female breast: C50.919

## 2014-11-26 LAB — CANCER ANTIGEN 27.29: CA 27.29: 29 U/mL (ref 0–39)

## 2014-11-26 LAB — CBC WITH DIFFERENTIAL (CANCER CENTER ONLY)
BASO#: 0 10*3/uL (ref 0.0–0.2)
BASO%: 0.5 % (ref 0.0–2.0)
EOS%: 1.4 % (ref 0.0–7.0)
Eosinophils Absolute: 0.1 10*3/uL (ref 0.0–0.5)
HCT: 44 % (ref 34.8–46.6)
HGB: 14.5 g/dL (ref 11.6–15.9)
LYMPH#: 1 10*3/uL (ref 0.9–3.3)
LYMPH%: 15.8 % (ref 14.0–48.0)
MCH: 28.3 pg (ref 26.0–34.0)
MCHC: 33 g/dL (ref 32.0–36.0)
MCV: 86 fL (ref 81–101)
MONO#: 0.5 10*3/uL (ref 0.1–0.9)
MONO%: 7.6 % (ref 0.0–13.0)
NEUT#: 4.8 10*3/uL (ref 1.5–6.5)
NEUT%: 74.7 % (ref 39.6–80.0)
PLATELETS: 210 10*3/uL (ref 145–400)
RBC: 5.12 10*6/uL (ref 3.70–5.32)
RDW: 18.1 % — ABNORMAL HIGH (ref 11.1–15.7)
WBC: 6.4 10*3/uL (ref 3.9–10.0)

## 2014-11-26 LAB — CMP (CANCER CENTER ONLY)
ALBUMIN: 3.5 g/dL (ref 3.3–5.5)
ALT(SGPT): 34 U/L (ref 10–47)
AST: 49 U/L — AB (ref 11–38)
Alkaline Phosphatase: 207 U/L — ABNORMAL HIGH (ref 26–84)
BUN, Bld: 6 mg/dL — ABNORMAL LOW (ref 7–22)
CHLORIDE: 102 meq/L (ref 98–108)
CO2: 25 meq/L (ref 18–33)
CREATININE: 0.6 mg/dL (ref 0.6–1.2)
Calcium: 9.3 mg/dL (ref 8.0–10.3)
GLUCOSE: 94 mg/dL (ref 73–118)
POTASSIUM: 3.7 meq/L (ref 3.3–4.7)
Sodium: 136 mEq/L (ref 128–145)
TOTAL PROTEIN: 8 g/dL (ref 6.4–8.1)
Total Bilirubin: 0.5 mg/dl (ref 0.20–1.60)

## 2014-11-26 MED ORDER — PALONOSETRON HCL INJECTION 0.25 MG/5ML
0.2500 mg | Freq: Once | INTRAVENOUS | Status: AC
  Administered 2014-11-26: 0.25 mg via INTRAVENOUS

## 2014-11-26 MED ORDER — ACETAMINOPHEN 325 MG PO TABS: 650.0000 mg | ORAL_TABLET | Freq: Once | ORAL | Status: DC

## 2014-11-26 MED ORDER — PALONOSETRON HCL INJECTION 0.25 MG/5ML
INTRAVENOUS | Status: AC
  Filled 2014-11-26: qty 5

## 2014-11-26 MED ORDER — PERTUZUMAB CHEMO INJECTION 420 MG/14ML
420.0000 mg | Freq: Once | INTRAVENOUS | Status: AC
  Administered 2014-11-26: 420 mg via INTRAVENOUS
  Filled 2014-11-26: qty 14

## 2014-11-26 MED ORDER — SODIUM CHLORIDE 0.9 % IV SOLN
150.0000 mg | Freq: Once | INTRAVENOUS | Status: AC
  Administered 2014-11-26: 150 mg via INTRAVENOUS
  Filled 2014-11-26: qty 5

## 2014-11-26 MED ORDER — SODIUM CHLORIDE 0.9 % IV SOLN: 150.0000 mg | Freq: Once | INTRAVENOUS | Status: DC

## 2014-11-26 MED ORDER — GADOBENATE DIMEGLUMINE 529 MG/ML IV SOLN
15.0000 mL | Freq: Once | INTRAVENOUS | Status: AC | PRN
  Administered 2014-11-26: 15 mL via INTRAVENOUS

## 2014-11-26 MED ORDER — ACETAMINOPHEN 500 MG PO TABS
ORAL_TABLET | ORAL | Status: AC
  Filled 2014-11-26: qty 2

## 2014-11-26 MED ORDER — SODIUM CHLORIDE 0.9 % IV SOLN
3.4000 mg/kg | Freq: Once | INTRAVENOUS | Status: AC
  Administered 2014-11-26: 300 mg via INTRAVENOUS
  Filled 2014-11-26: qty 15

## 2014-11-26 MED ORDER — DIPHENHYDRAMINE HCL 25 MG PO CAPS
50.0000 mg | ORAL_CAPSULE | Freq: Once | ORAL | Status: AC
  Administered 2014-11-26: 50 mg via ORAL

## 2014-11-26 MED ORDER — ACETAMINOPHEN 500 MG PO TABS
1000.0000 mg | ORAL_TABLET | Freq: Once | ORAL | Status: AC
  Administered 2014-11-26: 1000 mg via ORAL

## 2014-11-26 MED ORDER — HEPARIN SOD (PORK) LOCK FLUSH 100 UNIT/ML IV SOLN
500.0000 [IU] | Freq: Once | INTRAVENOUS | Status: AC | PRN
  Administered 2014-11-26: 500 [IU]
  Filled 2014-11-26: qty 5

## 2014-11-26 MED ORDER — SODIUM CHLORIDE 0.9 % IJ SOLN
10.0000 mL | INTRAMUSCULAR | Status: DC | PRN
  Administered 2014-11-26: 10 mL
  Filled 2014-11-26: qty 10

## 2014-11-26 MED ORDER — DIPHENHYDRAMINE HCL 25 MG PO CAPS
ORAL_CAPSULE | ORAL | Status: AC
  Filled 2014-11-26: qty 2

## 2014-11-26 MED ORDER — SODIUM CHLORIDE 0.9 % IV SOLN: Freq: Once | INTRAVENOUS | Status: DC

## 2014-11-26 MED ORDER — SODIUM CHLORIDE 0.9 % IV SOLN
Freq: Once | INTRAVENOUS | Status: AC
  Administered 2014-11-26: 12:00:00 via INTRAVENOUS

## 2014-11-26 NOTE — Progress Notes (Signed)
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 She had Zometa and fluids last week because she wasn't feeling well. She is better this week and is requesting to be treated. She had some vomiting on Monday and Tuesday without nausea. She is tired and having cold sweats. She has had some dizziness but it has improved.  She denies fever, n/v, cough, rash, headache, chest pain,palpitations, abdominal pain, constipation, diarrhea, blood in urine or stool. She has SOB with exertion at times.  No swelling, tenderness, numbness or tingling in her extremities. No new aches or pains.  Her appetite is better and she is able to drink fluids. Her weight is stable at 164 lbs.   CURRENT TREATMENT: Kadcyla - status post 5 cycles Perjeta every 3 week dosing Zometa 4 mg IV q. 3 week  REVIEW OF SYSTEMS: All other 10 point review of systems is negative.   PAST MEDICAL HISTORY: Past Medical History  Diagnosis Date  . Allergy   . Arthritis   . Asthma   . Coronary artery spasm   . PONV (postoperative nausea and vomiting)   . Family history of anesthesia complication     Mother and sisters has angioedema  . Sleep apnea     Does not use machine patient stated "its not bad enough for that"  . Pneumonia     hx of  . UTI (lower urinary tract infection)     Due to small ureters  . Hypertension     pt reports that while taking medication for coronary spasms caused elevated blood pressure. Pt no longer takes meds for coronary spasms or HTN.  . Radiation 08/06/14-09/16/14    right breast, axillary, supraclavicular region   . Cancer     right breast & lymph nodes    PAST SURGICAL  HISTORY: Past Surgical History  Procedure Laterality Date  . Cholecystectomy    . Knee surgery      right  . Tendon repair      finger  . Tonsillectomy    . Sinus surgery with instatrak      without instatrak  . Tubal ligation    . Colonoscopy w/ polypectomy  2008  . Wisdom tooth extraction    . Cardiac catheterization  X 3    no PCI  . Portacath placement Left 03/26/2014    Procedure: INSERTION PORT-A-CATH;  Surgeon: Rolm Bookbinder, MD;  Location: [place];  Service: General;  Laterality: Left;  . Breast biopsy with sentinel lymph node biopsy and needle localization      FAMILY HISTORY Family History  Problem Relation Age of Onset  . Coronary artery disease Mother   . Diabetes Mother   . Hypertension Mother   . Coronary artery disease Father   . Hypertension Father   . Hypertension Sister   . Coronary artery disease Maternal Grandmother   . Stroke Maternal Grandmother   . Depression Maternal Grandmother   . Cancer Maternal Grandmother     colon  . Coronary artery disease Maternal Grandfather   . Stroke Maternal Grandfather   . Depression Maternal Grandfather   . Coronary artery disease Paternal Grandmother   . Cancer Paternal Grandmother   . Cancer Paternal Grandfather  lung  . Hypertension Sister   . Cancer Paternal Aunt     breast    GYNECOLOGIC HISTORY:  Patient's last menstrual period was 04/22/2014.   SOCIAL HISTORY: History   Social History  . Marital Status: Legally Separated    Spouse Name: N/A    Number of Children: 0  . Years of Education: N/A   Occupational History  . RN [employer]   Social History Main Topics  . Smoking status: Former Smoker -- 0.50 packs/day for 24 years    Types: Cigarettes    Start date: 09/23/1984    Quit date: 10/22/2007  . Smokeless tobacco: Never Used     Comment: QUIT 7 YEARS AGO  . Alcohol Use: Yes     Comment: once a week  . Drug Use: No  . Sexual Activity: Yes    Birth Control/ Protection: Condom    Other Topics Concern  . Not on file   Social History Narrative    ADVANCED DIRECTIVES:  <no information>  HEALTH MAINTENANCE: History  Substance Use Topics  . Smoking status: Former Smoker -- 0.50 packs/day for 24 years    Types: Cigarettes    Start date: 09/23/1984    Quit date: 10/22/2007  . Smokeless tobacco: Never Used     Comment: QUIT 7 YEARS AGO  . Alcohol Use: Yes     Comment: once a week   Colonoscopy: PAP: Bone density: Lipid panel:  Allergies  Allergen Reactions  . Iohexol      Code: RASH, Desc: VERY STRONG FAMILY HX OF ANGIOEDEMA WHEN RECEIVING IV CONTRAST; PT HAS BEEN PREMEDICATED FOR OTHER CONTRASTED STUDIES(IN CATH. LAB)  KR, Onset Date: [id number]   . Prednisone Itching    Capillary beds bust  . Tetanus Toxoids Other (See Comments)    Ran a high fever for 48 hours  . Theophyllines Hives    Mental changes  . Versed [Midazolam] Other (See Comments)    Pt becomes violent    Current Outpatient Prescriptions  Medication Sig Dispense Refill  . albuterol (PROVENTIL HFA;VENTOLIN HFA) 108 (90 BASE) MCG/ACT inhaler Inhale 2 puffs into the lungs every 4 (four) hours as needed for wheezing or shortness of breath. 2 Inhaler 6  . ALPRAZolam (XANAX) 1 MG tablet Take 1 tablet (1 mg total) by mouth at bedtime as needed for anxiety. 30 tablet 2  . aspirin (ASPIRIN EC) 81 MG EC tablet Take 81 mg by mouth daily. Swallow whole.    . Bismuth Subsalicylate (PEPTO-BISMOL PO) Take by mouth as needed.    . calcium carbonate (TUMS - DOSED IN MG ELEMENTAL CALCIUM) 500 MG chewable tablet Chew 2 tablets by mouth 2 (two) times daily.    . Cholecalciferol (VITAMIN D) 2000 UNITS tablet Take 2,000 Units by mouth daily.    . diphenhydrAMINE (SOMINEX) 25 MG tablet Take 100 mg by mouth at bedtime as needed for sleep. Pt states she is taking up to 200 mg Benadryl at night and still not sleeping    . dronabinol (MARINOL) 10 MG capsule Take 1 capsule (10 mg total) by mouth 3 (three) times  daily before meals. 90 capsule 0  . emollient (BIAFINE) cream Apply topically as needed.    . ergocalciferol (VITAMIN D2) 50000 UNITS capsule Take 1 capsule (50,000 Units total) by mouth once a week. 4 capsule 6  . fentaNYL (DURAGESIC) 50 MCG/HR Place 1 patch (50 mcg total) onto the skin every 3 (three) days. 10 patch 0  . HYDROmorphone (DILAUDID)  4 MG tablet Take 1/2 - 1 pill, IF NEEDED, for pain every 4 hrs 90 tablet 0  . lidocaine (XYLOCAINE) 2 % solution Use as directed 20 mLs in the mouth or throat as needed for mouth pain. 200 mL 2  . lidocaine-prilocaine (EMLA) cream Apply 1 application topically as needed. Placeonto port site 57mn prior to treatment and cover with plastic wrap. 30 g 2  . loratadine-pseudoephedrine (CLARITIN-D 24-HOUR) 10-240 MG per 24 hr tablet Take 1 tablet by mouth daily.    . megestrol (MEGACE ES) 625 MG/5ML suspension Take 5 mLs (625 mg total) by mouth daily. 150 mL 0  . methylphenidate (RITALIN) 10 MG tablet Take 1 tablet (10 mg total) by mouth 2 (two) times daily. 60 tablet 0  . metoCLOPramide (REGLAN) 10 MG tablet Take 1 tablet (10 mg total) by mouth every 6 (six) hours as needed for nausea. 120 tablet 5  . Multiple Vitamin (MULTIVITAMIN) tablet Take 1 tablet by mouth daily.    . naproxen sodium (ANAPROX) 220 MG tablet Take 220 mg by mouth 2 (two) times daily with a meal.    . ondansetron (ZOFRAN) 8 MG tablet Take 1 tablet (8 mg total) by mouth every 8 (eight) hours as needed for nausea or vomiting. 20 tablet 2  . pantoprazole (PROTONIX) 40 MG tablet Take 1 tablet (40 mg total) by mouth daily. 30 tablet 6  . prochlorperazine (COMPAZINE) 10 MG tablet Take 1 tablet (10 mg total) by mouth every 6 (six) hours as needed for nausea or vomiting. (Patient not taking: Reported on 11/19/2014) 60 tablet 2  . promethazine (PHENERGAN) 25 MG suppository Place 1 suppository (25 mg total) rectally every 6 (six) hours as needed for nausea or vomiting. 30 each 3  . silver sulfADIAZINE  (SILVADENE) 1 % cream Apply 1 application topically 2 (two) times daily.    .Marland Kitchensulfamethoxazole-trimethoprim (BACTRIM DS,SEPTRA DS) 800-160 MG per tablet Take 1 tablet by mouth 2 (two) times daily. 14 tablet 0  . traZODone (DESYREL) 100 MG tablet Take 1-2 if needed at bed for sleep. 30 tablet 2   Current Facility-Administered Medications  Medication Dose Route Frequency Provider Last Rate Last Dose  . 0.9 %  sodium chloride infusion   Intravenous Once SEliezer Bottom NP      . fosaprepitant (EMEND) 150 mg in sodium chloride 0.9 % 145 mL IVPB  150 mg Intravenous Once SEliezer Bottom NP       Facility-Administered Medications Ordered in Other Visits  Medication Dose Route Frequency Provider Last Rate Last Dose  . ado-trastuzumab emtansine (KADCYLA) 320 mg in sodium chloride 0.9 % 250 mL chemo infusion  3.6 mg/kg (Treatment Plan Actual) Intravenous Once PVolanda Napoleon MD      . heparin lock flush 100 unit/mL  500 Units Intracatheter Once PRN PVolanda Napoleon MD      . palonosetron (ALOXI) injection 0.25 mg  0.25 mg Intravenous Once PVolanda Napoleon MD      . pertuzumab (PERJETA) 420 mg in sodium chloride 0.9 % 250 mL chemo infusion  420 mg Intravenous Once PVolanda Napoleon MD      . sodium chloride 0.9 % injection 10 mL  10 mL Intracatheter PRN PVolanda Napoleon MD        OBJECTIVE: Filed Vitals:   11/26/14 1140  BP: 121/86  Pulse: 78  Temp: 97.6 F (36.4 C)  Resp: 18    Filed Weights   11/26/14 1140  Weight: 164 lb  12.8 oz (74.753 kg)   ECOG FS:1 - Symptomatic but completely ambulatory Ocular: Sclerae unicteric, pupils equal, round and reactive to light Ear-nose-throat: Oropharynx clear, dentition fair Lymphatic: No cervical or supraclavicular adenopathy Lungs no rales or rhonchi, good excursion bilaterally Heart regular rate and rhythm, no murmur appreciated Abd soft, nontender, positive bowel sounds MSK no focal spinal tenderness, no joint edema Neuro: non-focal,  well-oriented, appropriate affect Breasts: No changes.  No mass, lesions, rash. She has some right axillary lymphadenopathy.   LAB RESULTS: CMP     Component Value Date/Time   NA 136 11/26/2014 1045   NA 136 03/24/2014 1147   K 3.7 11/26/2014 1045   K 4.2 03/24/2014 1147   CL 102 11/26/2014 1045   CL 105 03/24/2014 1147   CO2 25 11/26/2014 1045   CO2 21 03/24/2014 1147   GLUCOSE 94 11/26/2014 1045   GLUCOSE 86 03/24/2014 1147   BUN 6* 11/26/2014 1045   BUN 15 03/24/2014 1147   CREATININE 0.6 11/26/2014 1045   CREATININE 0.83 03/24/2014 1147   CALCIUM 9.3 11/26/2014 1045   CALCIUM 8.8 03/24/2014 1147   PROT 8.0 11/26/2014 1045   PROT 7.3 03/24/2014 1147   ALBUMIN 4.3 03/24/2014 1147   AST 49* 11/26/2014 1045   AST 19 03/24/2014 1147   ALT 34 11/26/2014 1045   ALT 28 03/24/2014 1147   ALKPHOS 207* 11/26/2014 1045   ALKPHOS 106 03/24/2014 1147   BILITOT 0.50 11/26/2014 1045   BILITOT 0.5 03/24/2014 1147   GFRNONAA >60 03/07/2011 1320   GFRAA  03/07/2011 1320    >60        The eGFR has been calculated using the MDRD equation. This calculation has not been validated in all clinical situations. eGFR's persistently <60 mL/min signify possible Chronic Kidney Disease.   INo results found for: SPEP, UPEP Lab Results  Component Value Date   WBC 6.4 11/26/2014   NEUTROABS 4.8 11/26/2014   HGB 14.5 11/26/2014   HCT 44.0 11/26/2014   MCV 86 11/26/2014   PLT 210 11/26/2014     Lab Results  Component Value Date   LABCA2 32 11/19/2014   No components found for: [id number] No results for input(s): INR in the last 168 hours.  STUDIES:  ASSESSMENT/PLAN: Bianca Kent is a 49 year old white female with triple positive metastatic breast cancer. She is feeling better this week but is still tired and having some dizziness. Her Her CBC and CMP today are improved.  We will proceed with cycle 6 of Kadcyla today.  We will also give her fluids and Emend.  We will schedule her  for an MRI of the brain this weekend.  She has her treatment and appointment schedule.  She knows to call here with any questions or concerns and to go to the ED in the event of an emergency. We can certainly see her sooner if need be.   Eliezer Bottom, NP 11/26/2014 1:09 PM

## 2014-11-26 NOTE — Telephone Encounter (Signed)
Pt stated she can take MRI contrast safely but sometimes she will take her own medicine. I left message for radiology to call back. NP will change order to with and with out

## 2014-11-26 NOTE — Patient Instructions (Signed)
Pertuzumab injection What is this medicine? PERTUZUMAB (per TOOZ ue mab) is a monoclonal antibody that targets a protein called HER2. HER2 is found in some breast cancers. This medicine can stop cancer cell growth. This medicine is used with other cancer treatments. This medicine may be used for other purposes; ask your health care provider or pharmacist if you have questions. COMMON BRAND NAME(S): PERJETA What should I tell my health care provider before I take this medicine? They need to know if you have any of these conditions: -heart disease -heart failure -high blood pressure -history of irregular heart beat -recent or ongoing radiation therapy -an unusual or allergic reaction to pertuzumab, other medicines, foods, dyes, or preservatives -pregnant or trying to get pregnant -breast-feeding How should I use this medicine? This medicine is for infusion into a vein. It is given by a health care professional in a hospital or clinic setting. Talk to your pediatrician regarding the use of this medicine in children. Special care may be needed. Overdosage: If you think you've taken too much of this medicine contact a poison control center or emergency room at once. Overdosage: If you think you have taken too much of this medicine contact a poison control center or emergency room at once. NOTE: This medicine is only for you. Do not share this medicine with others. What if I miss a dose? It is important not to miss your dose. Call your doctor or health care professional if you are unable to keep an appointment. What may interact with this medicine? Interactions are not expected. Give your health care provider a list of all the medicines, herbs, non-prescription drugs, or dietary supplements you use. Also tell them if you smoke, drink alcohol, or use illegal drugs. Some items may interact with your medicine. This list may not describe all possible interactions. Give your health care provider a  list of all the medicines, herbs, non-prescription drugs, or dietary supplements you use. Also tell them if you smoke, drink alcohol, or use illegal drugs. Some items may interact with your medicine. What should I watch for while using this medicine? Your condition will be monitored carefully while you are receiving this medicine. Report any side effects. Continue your course of treatment even though you feel ill unless your doctor tells you to stop. Do not become pregnant while taking this medicine. Women should inform their doctor if they wish to become pregnant or think they might be pregnant. There is a potential for serious side effects to an unborn child. Talk to your health care professional or pharmacist for more information. Do not breast-feed an infant while taking this medicine. Call your doctor or health care professional for advice if you get a fever, chills or sore throat, or other symptoms of a cold or flu. Do not treat yourself. Try to avoid being around people who are sick. You may experience fever, chills, and headache during the infusion. Report any side effects during the infusion to your health care professional. What side effects may I notice from receiving this medicine? Side effects that you should report to your doctor or health care professional as soon as possible: -breathing problems -chest pain or palpitations -dizziness -feeling faint or lightheaded -fever or chills -skin rash, itching or hives -sore throat -swelling of the face, lips, or tongue -swelling of the legs or ankles -unusually weak or tired Side effects that usually do not require medical attention (Report these to your doctor or health care professional if they continue or  are bothersome.): -diarrhea -hair loss -nausea, vomiting -tiredness This list may not describe all possible side effects. Call your doctor for medical advice about side effects. You may report side effects to FDA at  1-800-FDA-1088. Where should I keep my medicine? This drug is given in a hospital or clinic and will not be stored at home. NOTE: This sheet is a summary. It may not cover all possible information. If you have questions about this medicine, talk to your doctor, pharmacist, or health care provider.  2015, Elsevier/Gold Standard. (2012-08-07 16:54:15) Ado-Trastuzumab Emtansine for injection What is this medicine? Ado-trastuzumab emtansine (ADD oh traz TOO zuh mab em TAN zine) is a monoclonal antibody combined with chemotherapy. It targets a protein called HER2. This protein is found in some stomach and breast cancers. This medicine can stop cancer cell growth. This medicine may be used for other purposes; ask your health care provider or pharmacist if you have questions. COMMON BRAND NAME(S): Kadcyla What should I tell my health care provider before I take this medicine? They need to know if you have any of these conditions: -heart disease -heart failure -infection (especially a virus infection such as chickenpox, cold sores, or herpes) -liver disease -lung or breathing disease, like asthma -an unusual or allergic reaction to ado-trastuzumab emtansine, other medications, foods, dyes, or preservatives -pregnant or trying to get pregnant -breast-feeding How should I use this medicine? This medicine is for infusion into a vein. It is given by a health care professional in a hospital or clinic setting. Talk to your pediatrician regarding the use of this medicine in children. Special care may be needed. Overdosage: If you think you've taken too much of this medicine contact a poison control center or emergency room at once. Overdosage: If you think you have taken too much of this medicine contact a poison control center or emergency room at once. NOTE: This medicine is only for you. Do not share this medicine with others. What if I miss a dose? It is important not to miss your dose. Call your  doctor or health care professional if you are unable to keep an appointment. What may interact with this medicine? This medicine may also interact with the following medications: -atazanavir -boceprevir -clarithromycin -delavirdine -indinavir -dalfopristin; quinupristin -isoniazid, INH -itraconazole -ketoconazole -nefazodone -nelfinavir -ritonavir -telaprevir -telithromycin -tipranavir -voriconazole This list may not describe all possible interactions. Give your health care provider a list of all the medicines, herbs, non-prescription drugs, or dietary supplements you use. Also tell them if you smoke, drink alcohol, or use illegal drugs. Some items may interact with your medicine. What should I watch for while using this medicine? Visit your doctor for checks on your progress. This drug may make you feel generally unwell. This is not uncommon, as chemotherapy can affect healthy cells as well as cancer cells. Report any side effects. Continue your course of treatment even though you feel ill unless your doctor tells you to stop. Call your doctor or health care professional for advice if you get a fever, chills or sore throat, or other symptoms of a cold or flu. Do not treat yourself. This drug decreases your body's ability to fight infections. Try to avoid being around people who are sick. Be careful brushing and flossing your teeth or using a toothpick because you may get an infection or bleed more easily. If you have any dental work done, tell your dentist you are receiving this medicine. Avoid taking products that contain aspirin, acetaminophen, ibuprofen, naproxen, or  ketoprofen unless instructed by your doctor. These medicines may hide a fever. Do not become pregnant while taking this medicine. Women should inform their doctor if they wish to become pregnant or think they might be pregnant. There is a potential for serious side effects to an unborn child. [Men should inform their doctors  if they wish to father a child. This medicine may lower sperm counts.] Talk to your health care professional or pharmacist for more information. Do not breast-feed an infant while taking this medicine. You may need blood work done while you are taking this medicine. What side effects may I notice from receiving this medicine? Side effects that you should report to your doctor or health care professional as soon as possible: -allergic reactions like skin rash, itching or hives, swelling of the face, lips, or tongue -breathing problems -chest pain or palpitations -fever or chills, sore throat -general ill feeling or flu-like symptoms -light-colored stools -nausea, vomiting -pain, tingling, numbness in the hands or feet -signs and symptoms of bleeding such as bloody or black, tarry stools; red or dark-brown urine; spitting up blood or brown material that looks like coffee grounds; red spots on the skin; unusual bruising or bleeding from the eye, gums, or nose -swelling of the legs or ankles -yellowing of the eyes or skin Side effects that usually do not require medical attention (Report these to your doctor or health care professional if they continue or are bothersome.): -changes in taste -constipation -dizziness -headache -joint pain -muscle pain -trouble sleeping -unusually weak or tired This list may not describe all possible side effects. Call your doctor for medical advice about side effects. You may report side effects to FDA at 1-800-FDA-1088. Where should I keep my medicine? This drug is given in a hospital or clinic and will not be stored at home. NOTE: This sheet is a summary. It may not cover all possible information. If you have questions about this medicine, talk to your doctor, pharmacist, or health care provider.  2015, Elsevier/Gold Standard. (2013-02-18 12:55:10)

## 2014-11-27 ENCOUNTER — Encounter: Payer: Self-pay | Admitting: Hematology & Oncology

## 2014-11-28 ENCOUNTER — Ambulatory Visit (HOSPITAL_BASED_OUTPATIENT_CLINIC_OR_DEPARTMENT_OTHER): Payer: 59

## 2014-11-30 ENCOUNTER — Other Ambulatory Visit: Payer: Self-pay | Admitting: Radiation Therapy

## 2014-11-30 DIAGNOSIS — C7931 Secondary malignant neoplasm of brain: Secondary | ICD-10-CM

## 2014-12-03 ENCOUNTER — Other Ambulatory Visit: Payer: Self-pay | Admitting: Hematology & Oncology

## 2014-12-03 ENCOUNTER — Encounter: Payer: Self-pay | Admitting: Radiation Oncology

## 2014-12-03 NOTE — Progress Notes (Signed)
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 She tolerated her first 4cycles well.   Dose of Decadron, if applicable: none  Recent neurologic symptoms, if any:   Seizures: no  Headaches: no  Nausea: no, history of vomiting without nausea but none in past week  Dizziness/ataxia: yes, comes and goes, sporadic  Difficulty with hand coordination: no  Focal numbness/weakness: fatigue  Visual deficits/changes: slight change in far sight, but wears glasses and unsure if this is "just age related"  Confusion/Memory deficits: no  Painful bone metastases at present, if any: no  SAFETY ISSUES:  Prior radiation?  YES, 08/06/14- 09/16/14 right breast/axilla, supraclavicular region - Dr Sondra Come  Pacemaker/ICD? no  Possible current pregnancy? no  Is the patient on methotrexate? no  Additional Complaints / other details: history of breast cancer, pain in right axilla that ranges from mild to moderate- wears Duragesic patch 50 mcg with good control

## 2014-12-04 ENCOUNTER — Ambulatory Visit
Admission: RE | Admit: 2014-12-04 | Discharge: 2014-12-04 | Disposition: A | Discharge status: Home / Self Care | Payer: 59 | Source: Ambulatory Visit | Attending: Radiation Oncology | Admitting: Radiation Oncology

## 2014-12-04 ENCOUNTER — Encounter: Payer: Self-pay | Admitting: Radiation Oncology

## 2014-12-04 VITALS — BP 126/72 | HR 101 | Temp 98.1°F | Resp 20 | Ht 66.0 in | Wt 165.0 lb

## 2014-12-04 DIAGNOSIS — C7931 Secondary malignant neoplasm of brain: Secondary | ICD-10-CM

## 2014-12-04 DIAGNOSIS — C7949 Secondary malignant neoplasm of other parts of nervous system: Secondary | ICD-10-CM

## 2014-12-04 HISTORY — DX: Malignant neoplasm of unspecified site of unspecified female breast: C50.919

## 2014-12-04 HISTORY — DX: Secondary malignant neoplasm of unspecified site: C79.9

## 2014-12-04 MED ORDER — SODIUM CHLORIDE 0.9 % IJ SOLN
10.0000 mL | Freq: Once | INTRAMUSCULAR | Status: AC
  Administered 2014-12-04: 10 mL via INTRAVENOUS

## 2014-12-04 MED ORDER — HEPARIN SOD (PORK) LOCK FLUSH 100 UNIT/ML IV SOLN
500.0000 [IU] | Freq: Once | INTRAVENOUS | Status: AC
  Administered 2014-12-04: 500 [IU] via INTRAVENOUS

## 2014-12-04 NOTE — Progress Notes (Signed)
  Radiation Oncology         [phone] ________________________________  Name: Bianca Kent MRN: [id number]  Date: 12/04/2014  DOB: 13-Feb-1966  SIMULATION AND TREATMENT PLANNING NOTE Outpatient  Diagnosis   ICD-9-CM ICD-10-CM   1. Brain metastasis 198.3 C79.31 heparin lock flush 100 unit/mL      NARRATIVE:  The patient was brought to the [facility].  Identity was confirmed.  All relevant records and images related to the planned course of therapy were reviewed.  The patient freely provided informed written consent to proceed with treatment after reviewing the details related to the planned course of therapy. The consent form was witnessed and verified by the simulation staff. Intravenous access was established for contrast administration. Then, the patient was set-up in a stable reproducible supine position for radiation therapy.  A relocatable thermoplastic stereotactic head frame was fabricated for precise immobilization.  CT images were obtained.  Surface markings were placed.  The CT images were loaded into the planning software and fused with the patient's targeting MRI scan.  Then the target and avoidance structures were contoured.  Treatment planning then occurred.  The radiation prescription was entered and confirmed.  I have requested 3D planning  I have requested a DVH of the following structures: Brain stem, brain, left eye, right eye, lenses, optic chiasm, target volumes, uninvolved brain, and normal tissue.    PLAN:  The patient will receive 20 Gy in 1 fraction with stereotactic radiosurgery to the 11mm L temporal metastasis. If 3T MRI shows other tumors, they will likely also receive 20 Gy in 1 fraction. -----------------------------------  Eppie Gibson, MD

## 2014-12-04 NOTE — Addendum Note (Signed)
Encounter addended by: Rebecca Eaton, RN on: 12/04/2014  2:37 PM<BR>     Documentation filed: Dx Association, Orders

## 2014-12-04 NOTE — Addendum Note (Signed)
Encounter addended by: Eppie Gibson, MD on: 12/04/2014  6:38 PM<BR>     Documentation filed: Notes Section, Problem List

## 2014-12-04 NOTE — Progress Notes (Signed)
Radiation Oncology         [phone] ________________________________  Initial outpatient Consultation  Name: Bianca Kent MRN: [id number]  Date: 12/04/2014  DOB: 09/29/1966  RA:QTMA, Lina Sayre, MD  [name], MD   REFERRING PHYSICIAN: [name], MD  DIAGNOSIS: Brain metastasis, Grade III triple positive breast cancer   ICD-9-CM ICD-10-CM   1. 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  She is on Kadcyla. She tolerated her first 4cycles well. Her systemic disease has shrunk per serial PET imaging.  Due to vomiting, a brain MRI was recently performed.  It shows a 48m left temporal lesion consistent with metastasis. I also appreciate a punctate left parasagittal lesion.  The vomiting has resolved, and she denies any other new neurologic issues.  She is with her sister today.  Dose of Decadron, if applicable: none  Recent neurologic symptoms, if any:   Seizures: no  Headaches: no  Nausea: no, history of vomiting without nausea but none in past week  Dizziness/ataxia: yes, comes and goes, sporadic  Difficulty with hand coordination: no  Focal numbness/weakness: fatigue  Visual deficits/changes: slight change in far sight, but wears glasses and unsure if this is "just age related"  Confusion/Memory deficits: no  SAFETY ISSUES:    Pacemaker/ICD? no  Possible current pregnancy? no  Is the patient on methotrexate? no  Additional Complaints / other details: history of breast cancer, pain in right axilla that ranges from mild to moderate- wears Duragesic patch 50 mcg with good control.   PREVIOUS RADIATION THERAPY:  YES, 08/06/14- 09/16/14 right breast/axilla, supraclavicular region - Dr  KSondra Come  PAST MEDICAL HISTORY:  has a past medical history of Allergy; Arthritis; Asthma; Coronary artery spasm; PONV (postoperative nausea and vomiting); Family history of anesthesia complication; Sleep apnea; Pneumonia; UTI (lower urinary tract infection); Hypertension; Radiation (08/06/14-09/16/14); Cancer; Metastasis from breast cancer (11/26/14); and Breast cancer (02/2014).    PAST SURGICAL HISTORY: Past Surgical History  Procedure Laterality Date  . Cholecystectomy    . Knee surgery      right  . Tendon repair      finger  . Tonsillectomy    . Sinus surgery with instatrak      without instatrak  . Tubal ligation    . Colonoscopy w/ polypectomy  2008  . Wisdom tooth extraction    . Cardiac catheterization  X 3    no PCI  . Portacath placement Left 03/26/2014    Procedure: INSERTION PORT-A-CATH;  Surgeon: MRolm Bookbinder MD;  [facility]  Service: General;  Laterality: Left;  . Breast biopsy with sentinel lymph node biopsy and needle localization      FAMILY HISTORY: family history includes Cancer in her maternal grandmother, paternal aunt, paternal grandfather, and paternal grandmother; Coronary artery disease in her father, maternal grandfather, maternal grandmother, mother, and paternal grandmother; Depression in her maternal grandfather and maternal grandmother; Diabetes in her mother; Hypertension in her father, mother, sister, and sister; Stroke in her maternal grandfather and maternal grandmother.  SOCIAL HISTORY:  reports that she quit smoking about 7 years ago. Her smoking use included Cigarettes. She started smoking about 30 years ago. She has a 12 pack-year smoking history. She has never used  smokeless tobacco. She reports that she drinks alcohol. She reports that she does not use illicit drugs.  ALLERGIES: Iohexol; Prednisone; Tetanus toxoids; Theophyllines; and Versed  MEDICATIONS:  Current Outpatient Prescriptions  Medication Sig Dispense Refill  . albuterol  (PROVENTIL HFA;VENTOLIN HFA) 108 (90 BASE) MCG/ACT inhaler Inhale 2 puffs into the lungs every 4 (four) hours as needed for wheezing or shortness of breath. 2 Inhaler 6  . ALPRAZolam (XANAX) 1 MG tablet Take 1 tablet (1 mg total) by mouth at bedtime as needed for anxiety. 30 tablet 2  . aspirin (ASPIRIN EC) 81 MG EC tablet Take 81 mg by mouth daily. Swallow whole.    . Bismuth Subsalicylate (PEPTO-BISMOL PO) Take by mouth as needed.    . calcium carbonate (TUMS - DOSED IN MG ELEMENTAL CALCIUM) 500 MG chewable tablet Chew 2 tablets by mouth 2 (two) times daily.    . diphenhydrAMINE (SOMINEX) 25 MG tablet Take 100 mg by mouth at bedtime as needed for sleep. Pt states she is taking up to 200 mg Benadryl at night and still not sleeping    . dronabinol (MARINOL) 10 MG capsule Take 1 capsule (10 mg total) by mouth 3 (three) times daily before meals. 90 capsule 0  . ergocalciferol (VITAMIN D2) 50000 UNITS capsule Take 1 capsule (50,000 Units total) by mouth once a week. 4 capsule 6  . fentaNYL (DURAGESIC) 50 MCG/HR Place 1 patch (50 mcg total) onto the skin every 3 (three) days. 10 patch 0  . HYDROmorphone (DILAUDID) 4 MG tablet Take 1/2 - 1 pill, IF NEEDED, for pain every 4 hrs 90 tablet 0  . lidocaine (XYLOCAINE) 2 % solution Use as directed 20 mLs in the mouth or throat as needed for mouth pain. 200 mL 2  . lidocaine-prilocaine (EMLA) cream Apply 1 application topically as needed. Placeonto port site 42mn prior to treatment and cover with plastic wrap. 30 g 2  . loratadine-pseudoephedrine (CLARITIN-D 24-HOUR) 10-240 MG per 24 hr tablet Take 1 tablet by mouth daily.    . methylphenidate (RITALIN) 10 MG tablet Take 1 tablet (10 mg total) by mouth 2 (two) times daily. 60 tablet 0  . metoCLOPramide (REGLAN) 10 MG tablet Take 1 tablet (10 mg total) by mouth every 6 (six) hours as needed for nausea. 120 tablet 5  . Multiple Vitamin (MULTIVITAMIN) tablet Take 1 tablet by mouth daily.    . naproxen sodium  (ANAPROX) 220 MG tablet Take 220 mg by mouth 2 (two) times daily with a meal.    . ondansetron (ZOFRAN) 8 MG tablet TAKE 1 TABLET BY MOUTH EVERY 8 HOURS AS NEEDED FOR NAUSEA OR VOMITING 20 tablet 2  . pantoprazole (PROTONIX) 40 MG tablet Take 1 tablet (40 mg total) by mouth daily. 30 tablet 6  . prochlorperazine (COMPAZINE) 10 MG tablet Take 1 tablet (10 mg total) by mouth every 6 (six) hours as needed for nausea or vomiting. 60 tablet 2  . promethazine (PHENERGAN) 25 MG suppository Place 1 suppository (25 mg total) rectally every 6 (six) hours as needed for nausea or vomiting. 30 each 3  . sulfamethoxazole-trimethoprim (BACTRIM DS,SEPTRA DS) 800-160 MG per tablet Take 1 tablet by mouth 2 (two) times daily. 14 tablet 0  . traZODone (DESYREL) 100 MG tablet Take 1-2 if needed at bed for sleep. 30 tablet 2   No current facility-administered medications for this encounter.    REVIEW OF SYSTEMS:  Notable for that above.   PHYSICAL EXAM:  height is _0  (  1.676 m) and weight is 165 lb (74.844 kg). Her oral temperature is 98.1 F (36.7 C). Her blood pressure is 126/72 and her pulse is 101. Her respiration is 20.   General: Alert and oriented, in no acute distress HEENT: Head is normocephalic. Extraocular movements are intact. Oropharynx is clear. Neck: Neck is supple, no palpable cervical or supraclavicular lymphadenopathy. Heart: Regular in rate and rhythm with no murmurs, rubs, or gallops. Chest: decreased breath sounds in right base Abdomen: Soft, nontender, nondistended, with no rigidity or guarding. Extremities: No cyanosis or edema. Lymphatics: see Neck Exam Skin: No concerning lesions. Musculoskeletal: symmetric strength and muscle tone throughout. Neurologic: Cranial nerves II through XII are grossly intact. No obvious focalities. Speech is fluent. Coordination is intact. Psychiatric: Judgment and insight are intact. Affect is appropriate.  KPS = 80   LABORATORY DATA:  Lab Results    Component Value Date   WBC 6.4 11/26/2014   HGB 14.5 11/26/2014   HCT 44.0 11/26/2014   MCV 86 11/26/2014   PLT 210 11/26/2014   CMP     Component Value Date/Time   NA 136 11/26/2014 1045   NA 136 03/24/2014 1147   K 3.7 11/26/2014 1045   K 4.2 03/24/2014 1147   CL 102 11/26/2014 1045   CL 105 03/24/2014 1147   CO2 25 11/26/2014 1045   CO2 21 03/24/2014 1147   GLUCOSE 94 11/26/2014 1045   GLUCOSE 86 03/24/2014 1147   BUN 6* 11/26/2014 1045   BUN 15 03/24/2014 1147   CREATININE 0.6 11/26/2014 1045   CREATININE 0.83 03/24/2014 1147   CALCIUM 9.3 11/26/2014 1045   CALCIUM 8.8 03/24/2014 1147   PROT 8.0 11/26/2014 1045   PROT 7.3 03/24/2014 1147   ALBUMIN 4.3 03/24/2014 1147   AST 49* 11/26/2014 1045   AST 19 03/24/2014 1147   ALT 34 11/26/2014 1045   ALT 28 03/24/2014 1147   ALKPHOS 207* 11/26/2014 1045   ALKPHOS 106 03/24/2014 1147   BILITOT 0.50 11/26/2014 1045   BILITOT 0.5 03/24/2014 1147   GFRNONAA >60 03/07/2011 1320   GFRAA  03/07/2011 1320    >60        The eGFR has been calculated using the MDRD equation. This calculation has not been validated in all clinical situations. eGFR's persistently <60 mL/min signify possible Chronic Kidney Disease.         RADIOGRAPHY: Mr Kizzie Fantasia Contrast  11/27/2014   CLINICAL DATA:  Metastatic breast cancer with nausea and vomiting.  EXAM: MRI HEAD WITHOUT AND WITH CONTRAST  TECHNIQUE: Multiplanar, multiecho pulse sequences of the brain and surrounding structures were obtained without and with intravenous contrast.  CONTRAST:  74m MULTIHANCE GADOBENATE DIMEGLUMINE 529 MG/ML IV SOLN  COMPARISON:  03/09/2008  FINDINGS: Calvarium and upper cervical spine: No focal marrow signal abnormality to suggest metastasis.  Sinuses: There is new nonenhancing signal abnormality within the expanded left anterior ethmoid and hypoplastic frontal sinus, bowing the medial wall of the left orbit. There is no abnormal intraorbital enhancement or  signal. Mastoid and middle ears are clear.  Brain: There is a 4 mm solitary intra-axial enhancing mass in the left temporal lobe, thin-section axial image number 36. There is associated mild FLAIR hyperintensity consistent with early edema. There is a punctate density in the parasagittal subcortical left frontal lobe on image 59 which is too subtle to be definitive for mass. No evidence of extra-axial/subarachnoid spread.  No infarct, hemorrhage, hydrocephalus, or evidence of major vessel occlusion.  These results will be called to the ordering clinician or representative by the Radiologist Assistant, and communication documented in the PACS or zVision Dashboard.  IMPRESSION: 1. Solitary 4 mm enhancing mass in the left temporal lobe consistent with early metastatic disease. 2. 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 Sinus expansion distorts the left orbit.   Electronically Signed   By: Jorje Guild M.D.   On: 11/27/2014 07:19      IMPRESSION/PLAN: I had a discussion with the patient and her sister after reviewing her workup and diagnosis of brain metastasis with them. They understand she has a 70m Left Temporal brain metastasis and another punctate left parasagittal lesion that is somewhat suspicious. 3T MRI is pending for Monday to better stage her brain.  We spoke about whole brain radiotherapy versus stereotactic radiosurgery to the brain. We spoke about the differing risks benefits and side effects of both of these treatments. During part of our discussion, we spoke about the hair loss and fatigue that can result from whole brain radiotherapy and we spoke about radionecrosis that can result from stereotactic radiosurgery. I explained that whole brain radiotherapy is more comprehensive while stereotactic radiosurgery only treats the areas of gross disease while sparing the rest of the brain parenchyma.  Per my recommendations, the patient would like to proceed with stereotactic  radiosurgery to the brain .  Due to snow anticipated for Monday, we will simulate her today.  That way, she'll only need to travel to her 3T MRI on Monday and not a simulation appointment as well. [facility]   __________________________________________   SEppie Gibson MD

## 2014-12-04 NOTE — Addendum Note (Signed)
Encounter addended by: Rebecca Eaton, RN on: 12/04/2014  2:44 PM<BR>     Documentation filed: Inpatient MAR

## 2014-12-04 NOTE — Progress Notes (Signed)
Flushed with 79ml Normal saline, and  34ml heparin per procotol, pt port acath deaccessed, bandaid appied over site, d/c patient home, toleraterd wel no c/o 3:30 PM

## 2014-12-04 NOTE — Addendum Note (Signed)
Encounter addended by: Eppie Gibson, MD on: 12/04/2014  6:39 PM<BR>     Documentation filed: Follow-up Section, LOS Section

## 2014-12-07 ENCOUNTER — Other Ambulatory Visit: Payer: 59

## 2014-12-07 ENCOUNTER — Ambulatory Visit: Payer: 59

## 2014-12-07 ENCOUNTER — Telehealth: Payer: Self-pay | Admitting: *Deleted

## 2014-12-07 ENCOUNTER — Ambulatory Visit: Payer: 59 | Admitting: Radiation Oncology

## 2014-12-07 NOTE — Telephone Encounter (Signed)
Called patient to follow up on her call to the on-call service over the weekend. Patient has not yet started the mouthwash prescribed her. She states her mouth and tongue are sore and cracked, but she is continuing to eat and drink. She does not feel like she needs to be seen and believes she has enough intake to support her needs. Patient advised to call us back if mouthwash doesn't improve symptoms, or if she is unable to eat/drink. She understands and is in agreement.

## 2014-12-10 ENCOUNTER — Ambulatory Visit: Payer: 59

## 2014-12-10 ENCOUNTER — Ambulatory Visit: Payer: 59 | Admitting: Hematology & Oncology

## 2014-12-10 ENCOUNTER — Other Ambulatory Visit: Payer: 59 | Admitting: Lab

## 2014-12-11 ENCOUNTER — Ambulatory Visit
Admission: RE | Admit: 2014-12-11 | Discharge: 2014-12-11 | Disposition: A | Discharge status: Home / Self Care | Payer: 59 | Source: Ambulatory Visit | Attending: Radiation Oncology | Admitting: Radiation Oncology

## 2014-12-11 ENCOUNTER — Encounter: Payer: Self-pay | Admitting: *Deleted

## 2014-12-11 ENCOUNTER — Ambulatory Visit (HOSPITAL_BASED_OUTPATIENT_CLINIC_OR_DEPARTMENT_OTHER): Payer: 59

## 2014-12-11 DIAGNOSIS — C50811 Malignant neoplasm of overlapping sites of right female breast: Secondary | ICD-10-CM

## 2014-12-11 DIAGNOSIS — Z95828 Presence of other vascular implants and grafts: Secondary | ICD-10-CM

## 2014-12-11 DIAGNOSIS — Z452 Encounter for adjustment and management of vascular access device: Secondary | ICD-10-CM

## 2014-12-11 DIAGNOSIS — C7931 Secondary malignant neoplasm of brain: Secondary | ICD-10-CM

## 2014-12-11 MED ORDER — GADOBENATE DIMEGLUMINE 529 MG/ML IV SOLN
15.0000 mL | Freq: Once | INTRAVENOUS | Status: AC | PRN
  Administered 2014-12-11: 15 mL via INTRAVENOUS

## 2014-12-11 MED ORDER — SODIUM CHLORIDE 0.9 % IJ SOLN
10.0000 mL | INTRAMUSCULAR | Status: DC | PRN
  Administered 2014-12-11: 10 mL via INTRAVENOUS
  Filled 2014-12-11: qty 10

## 2014-12-11 NOTE — Progress Notes (Signed)
[place] Psychosocial Distress Screening Clinical Social Work  Clinical Social Work was referred by distress screening protocol.  The patient scored a 8 on the Psychosocial Distress Thermometer which indicates severe distress. Clinical Social Worker phoned pt to assess for distress and other psychosocial needs. Pt reports she has realized she needs assistance in adjusting to her illness and has begun counseling with Jeanella Craze at [facility] once a week. CSW encouraged pt to attend Brain Support group as well and she plans to attend next month. Pt reports to have good support from family.   CSW reviewed resources available through Pt and Family Support Team. Pt plans to reach out to [facility] for assistance and CSW provided her with the number. Pt reports she got her insurance issues straightened out right now. Pt glad to know CSW team is available and agrees to reach out as needed. Pt open to [place] meeting with her at her [facility] appt.   ONCBCN DISTRESS SCREENING 12/04/2014  Screening Type Change in Status  Distress experienced in past week (1-10) 8  Practical problem type Insurance  Emotional problem type Nervousness/Anxiety;Adjusting to illness  Spiritual/Religous concerns type Loss of sense of purpose  Information Concerns Type Lack of info about complementary therapy choices  Physical Problem type Pain;Sleep/insomnia;Mouth sores/swallowing;Loss of appetitie  Physician notified of physical symptoms   Other "dealing with my diagnosis" listed as most distressing, may phone    Clinical Social Worker follow up needed: Yes.    If yes, follow up plan: CSW to meet at [facility] appt.   Loren Racer, [place] Worker [place]  [place] Phone: [phone] Fax: [phone]

## 2014-12-11 NOTE — Patient Instructions (Signed)

## 2014-12-14 DIAGNOSIS — C7931 Secondary malignant neoplasm of brain: Secondary | ICD-10-CM | POA: Diagnosis not present

## 2014-12-14 NOTE — Addendum Note (Signed)
Encounter addended by: Andria Rhein, RN on: 12/14/2014  7:32 AM<BR>     Documentation filed: Charges VN

## 2014-12-16 ENCOUNTER — Encounter: Payer: Self-pay | Admitting: Radiation Oncology

## 2014-12-16 ENCOUNTER — Ambulatory Visit
Admission: RE | Admit: 2014-12-16 | Discharge: 2014-12-16 | Disposition: A | Discharge status: Home / Self Care | Payer: 59 | Source: Ambulatory Visit | Attending: Radiation Oncology | Admitting: Radiation Oncology

## 2014-12-16 VITALS — BP 113/77 | HR 80 | Temp 98.1°F | Resp 20

## 2014-12-16 DIAGNOSIS — Z923 Personal history of irradiation: Secondary | ICD-10-CM

## 2014-12-16 DIAGNOSIS — C7931 Secondary malignant neoplasm of brain: Secondary | ICD-10-CM

## 2014-12-16 HISTORY — DX: Personal history of irradiation: Z92.3

## 2014-12-16 NOTE — Progress Notes (Signed)
  Radiation Oncology         [phone] ________________________________  Stereotactic Treatment Procedure Note  Name: Bianca Kent MRN: [id number]  Date: 12/16/2014  DOB: 06-23-1966  SPECIAL TREATMENT PROCEDURE  3D TREATMENT PLANNING AND DOSIMETRY:  The patient's radiation plan was reviewed and approved by neurosurgery and radiation oncology prior to treatment.  It showed 3-dimensional radiation distributions overlaid onto the planning CT/MRI image set.  The [facility] for the target structures as well as the organs at risk were reviewed. The documentation of the 3D plan and dosimetry are filed in the radiation oncology EMR.  NARRATIVE:  Bianca Kent was brought to the TrueBeam stereotactic radiation treatment machine and placed supine on the CT couch. The head frame was applied, and the patient was set up for stereotactic radiosurgery.  Neurosurgery was present for the set-up and delivery  SIMULATION VERIFICATION:  In the couch zero-angle position, the patient underwent Exactrac imaging using the Brainlab system with orthogonal KV images.  These were carefully aligned and repeated to confirm treatment position for each of the isocenters.  The Exactrac snap film verification was repeated at each couch angle.  SPECIAL TREATMENT PROCEDURE: [name] received stereotactic radiosurgery to the following targets: Left temporal 65mm target was treated using 3 Circular Arcs to a prescription dose of 20 Gy Rx to the 72.5% isodose line.  ExacTrac Snap verification was performed for each couch angleThis constitutes a special treatment procedure due to the ablative dose delivered and the technical nature of treatment.  This highly technical modality of treatment ensures that the ablative dose is centered on the patient's tumor while sparing normal tissues from excessive dose and risk of detrimental effects.  STEREOTACTIC TREATMENT MANAGEMENT:  Following delivery, the patient was transported to nursing  in stable condition and monitored for possible acute effects.  Vital signs were recorded BP 113/77 mmHg  Pulse 80  Temp(Src) 98.1 F (36.7 C) (Oral)  Resp 20  LMP 04/22/2014. The patient tolerated treatment without significant acute effects, and was discharged to home in stable condition.    PLAN: Follow-up in one month.  ________________________________   Eppie Gibson, MD

## 2014-12-16 NOTE — Op Note (Signed)
Stereotactic Radiosurgery Operative Note  Name: PIXIE BURGENER MRN: [id number]  Date: 12/16/2014  DOB: [birth year]  Op Note  Pre Operative Diagnosis:  Metastatic breast cancer with brain metastasis  Post Operative Diagnois:  Metastatic breast cancer with brain metastasis  3D TREATMENT PLANNING AND DOSIMETRY:  The patient's radiation plan was reviewed and approved by myself (neurosurgery) and Dr. Eppie Gibson (radiation oncology) prior to treatment.  It showed 3-dimensional radiation distributions overlaid onto the planning CT/MRI image set.  The [facility] for the target structures as well as the organs at risk were reviewed. The documentation of the 3D plan and dosimetry are filed in the radiation oncology EMR.  NARRATIVE:  KERLY RIGSBEE was brought to the TrueBeam stereotactic radiation treatment machine and placed supine on the CT couch. The head frame was applied, and the patient was set up for stereotactic radiosurgery.  I was present for the set-up and delivery.  SIMULATION VERIFICATION:  In the couch zero-angle position, the patient underwent Exactrac imaging using the Brainlab system with orthogonal KV images.  These were carefully aligned and repeated to confirm treatment position for each of the isocenters.  The Exactrac snap film verification was repeated at each couch angle.  SPECIAL TREATMENT PROCEDURE: [name] received stereotactic radiosurgery to the following targets: Left temperal target was treated using 3 Circular Arcs to a prescription dose of 20 Gy.  ExacTrac registration was performed for each couch angle.  The 72.5% isodose line was prescribed.  STEREOTACTIC TREATMENT MANAGEMENT:  Following delivery, the patient was transported to nursing in stable condition and monitored for possible acute effects.  Vital signs were recorded LMP 04/22/2014. The patient tolerated treatment without significant acute effects, and was discharged to home in stable condition.    PLAN:  Follow-up in one month.

## 2014-12-16 NOTE — Progress Notes (Signed)
Patient arrived ambulatory steady gait to dressing room 1 at nursing s/p SRS brain tx x 1, to monitor patient 30 minutes, patient gave name and dob as identification,denys, nausea,dizzyness, or vision changes, does have mild headache,  1 month f/u appt card given to patient by Marijean Bravo, offered warm blaket, sprite, ,turned down the light, informed patient to call MD for any unusual symptoms occuring, fever, increased nausea, severe head ache, dizzy ness, patient gave verbal understanding 1:50 PM

## 2014-12-17 ENCOUNTER — Other Ambulatory Visit: Payer: Self-pay | Admitting: Family

## 2014-12-17 ENCOUNTER — Encounter: Payer: Self-pay | Admitting: Hematology & Oncology

## 2014-12-17 ENCOUNTER — Other Ambulatory Visit (HOSPITAL_BASED_OUTPATIENT_CLINIC_OR_DEPARTMENT_OTHER): Payer: 59 | Admitting: Lab

## 2014-12-17 ENCOUNTER — Ambulatory Visit (HOSPITAL_BASED_OUTPATIENT_CLINIC_OR_DEPARTMENT_OTHER): Payer: 59

## 2014-12-17 ENCOUNTER — Ambulatory Visit (HOSPITAL_BASED_OUTPATIENT_CLINIC_OR_DEPARTMENT_OTHER): Payer: 59 | Admitting: Hematology & Oncology

## 2014-12-17 VITALS — BP 107/77 | HR 91 | Temp 98.2°F | Resp 14 | Ht 65.0 in | Wt 164.0 lb

## 2014-12-17 DIAGNOSIS — K5900 Constipation, unspecified: Secondary | ICD-10-CM

## 2014-12-17 DIAGNOSIS — R64 Cachexia: Secondary | ICD-10-CM

## 2014-12-17 DIAGNOSIS — C7951 Secondary malignant neoplasm of bone: Secondary | ICD-10-CM

## 2014-12-17 DIAGNOSIS — R5383 Other fatigue: Secondary | ICD-10-CM

## 2014-12-17 DIAGNOSIS — C50919 Malignant neoplasm of unspecified site of unspecified female breast: Secondary | ICD-10-CM

## 2014-12-17 DIAGNOSIS — C7931 Secondary malignant neoplasm of brain: Secondary | ICD-10-CM

## 2014-12-17 DIAGNOSIS — C50911 Malignant neoplasm of unspecified site of right female breast: Secondary | ICD-10-CM

## 2014-12-17 DIAGNOSIS — G4701 Insomnia due to medical condition: Secondary | ICD-10-CM

## 2014-12-17 DIAGNOSIS — Z5112 Encounter for antineoplastic immunotherapy: Secondary | ICD-10-CM

## 2014-12-17 LAB — CBC WITH DIFFERENTIAL (CANCER CENTER ONLY)
BASO#: 0 10*3/uL (ref 0.0–0.2)
BASO%: 0.6 % (ref 0.0–2.0)
EOS%: 3.2 % (ref 0.0–7.0)
Eosinophils Absolute: 0.2 10*3/uL (ref 0.0–0.5)
HEMATOCRIT: 41 % (ref 34.8–46.6)
HGB: 13.3 g/dL (ref 11.6–15.9)
LYMPH#: 1 10*3/uL (ref 0.9–3.3)
LYMPH%: 20.6 % (ref 14.0–48.0)
MCH: 28.4 pg (ref 26.0–34.0)
MCHC: 32.4 g/dL (ref 32.0–36.0)
MCV: 87 fL (ref 81–101)
MONO#: 0.4 10*3/uL (ref 0.1–0.9)
MONO%: 8.7 % (ref 0.0–13.0)
NEUT%: 66.9 % (ref 39.6–80.0)
NEUTROS ABS: 3.4 10*3/uL (ref 1.5–6.5)
Platelets: 146 10*3/uL (ref 145–400)
RBC: 4.69 10*6/uL (ref 3.70–5.32)
RDW: 17.9 % — ABNORMAL HIGH (ref 11.1–15.7)
WBC: 5.1 10*3/uL (ref 3.9–10.0)

## 2014-12-17 LAB — CMP (CANCER CENTER ONLY)
ALT: 24 U/L (ref 10–47)
AST: 42 U/L — ABNORMAL HIGH (ref 11–38)
Albumin: 3.2 g/dL — ABNORMAL LOW (ref 3.3–5.5)
Alkaline Phosphatase: 180 U/L — ABNORMAL HIGH (ref 26–84)
BILIRUBIN TOTAL: 0.5 mg/dL (ref 0.20–1.60)
BUN, Bld: 8 mg/dL (ref 7–22)
CO2: 28 meq/L (ref 18–33)
Calcium: 9.3 mg/dL (ref 8.0–10.3)
Chloride: 101 mEq/L (ref 98–108)
Creat: 0.7 mg/dl (ref 0.6–1.2)
Glucose, Bld: 89 mg/dL (ref 73–118)
Potassium: 3.6 mEq/L (ref 3.3–4.7)
SODIUM: 142 meq/L (ref 128–145)
TOTAL PROTEIN: 8 g/dL (ref 6.4–8.1)

## 2014-12-17 LAB — CANCER ANTIGEN 27.29: CA 27.29: 22 U/mL (ref 0–39)

## 2014-12-17 LAB — LACTATE DEHYDROGENASE: LDH: 183 U/L (ref 94–250)

## 2014-12-17 MED ORDER — LORAZEPAM 1 MG PO TABS: ORAL_TABLET | ORAL | Status: DC

## 2014-12-17 MED ORDER — HEPARIN SOD (PORK) LOCK FLUSH 100 UNIT/ML IV SOLN
500.0000 [IU] | Freq: Once | INTRAVENOUS | Status: DC | PRN
  Filled 2014-12-17: qty 5

## 2014-12-17 MED ORDER — SODIUM CHLORIDE 0.9 % IJ SOLN
3.0000 mL | Freq: Once | INTRAMUSCULAR | Status: DC | PRN
  Filled 2014-12-17: qty 10

## 2014-12-17 MED ORDER — HEPARIN SOD (PORK) LOCK FLUSH 100 UNIT/ML IV SOLN
250.0000 [IU] | Freq: Once | INTRAVENOUS | Status: DC | PRN
  Filled 2014-12-17: qty 5

## 2014-12-17 MED ORDER — PALONOSETRON HCL INJECTION 0.25 MG/5ML
INTRAVENOUS | Status: AC
  Filled 2014-12-17: qty 5

## 2014-12-17 MED ORDER — HEPARIN SOD (PORK) LOCK FLUSH 100 UNIT/ML IV SOLN
500.0000 [IU] | Freq: Once | INTRAVENOUS | Status: AC | PRN
  Administered 2014-12-17: 500 [IU]
  Filled 2014-12-17: qty 5

## 2014-12-17 MED ORDER — ACETAMINOPHEN 325 MG PO TABS
ORAL_TABLET | ORAL | Status: AC
  Filled 2014-12-17: qty 2

## 2014-12-17 MED ORDER — SODIUM CHLORIDE 0.9 % IJ SOLN
10.0000 mL | INTRAMUSCULAR | Status: DC | PRN
  Filled 2014-12-17: qty 10

## 2014-12-17 MED ORDER — ZOLEDRONIC ACID 4 MG/100ML IV SOLN
4.0000 mg | Freq: Once | INTRAVENOUS | Status: AC
  Filled 2014-12-17: qty 100

## 2014-12-17 MED ORDER — HYDROMORPHONE HCL 4 MG PO TABS: ORAL_TABLET | ORAL | Status: DC

## 2014-12-17 MED ORDER — PALONOSETRON HCL INJECTION 0.25 MG/5ML
0.2500 mg | Freq: Once | INTRAVENOUS | Status: AC
  Administered 2014-12-17: 0.25 mg via INTRAVENOUS

## 2014-12-17 MED ORDER — SODIUM CHLORIDE 0.9 % IV SOLN: Freq: Once | INTRAVENOUS | Status: DC

## 2014-12-17 MED ORDER — DIPHENHYDRAMINE HCL 25 MG PO CAPS
ORAL_CAPSULE | ORAL | Status: AC
  Filled 2014-12-17: qty 2

## 2014-12-17 MED ORDER — SODIUM CHLORIDE 0.9 % IV SOLN
150.0000 mg | Freq: Once | INTRAVENOUS | Status: AC
  Administered 2014-12-17: 150 mg via INTRAVENOUS
  Filled 2014-12-17: qty 5

## 2014-12-17 MED ORDER — SODIUM CHLORIDE 0.9 % IV SOLN
Freq: Once | INTRAVENOUS | Status: AC
  Administered 2014-12-17: 10:00:00 via INTRAVENOUS

## 2014-12-17 MED ORDER — ACETAMINOPHEN 325 MG PO TABS
650.0000 mg | ORAL_TABLET | Freq: Once | ORAL | Status: AC
Start: 2014-12-17 — End: 2014-12-17
  Administered 2014-12-17: 650 mg via ORAL

## 2014-12-17 MED ORDER — OLANZAPINE 5 MG PO TABS: 5.0000 mg | ORAL_TABLET | Freq: Every day | ORAL | Status: DC

## 2014-12-17 MED ORDER — DIPHENHYDRAMINE HCL 25 MG PO CAPS
50.0000 mg | ORAL_CAPSULE | Freq: Once | ORAL | Status: AC
Start: 2014-12-17 — End: 2014-12-17
  Administered 2014-12-17: 50 mg via ORAL

## 2014-12-17 MED ORDER — SODIUM CHLORIDE 0.9 % IV SOLN
3.6000 mg/kg | Freq: Once | INTRAVENOUS | Status: AC
  Administered 2014-12-17: 300 mg via INTRAVENOUS
  Filled 2014-12-17: qty 15

## 2014-12-17 MED ORDER — ZOLEDRONIC ACID 4 MG/100ML IV SOLN
4.0000 mg | Freq: Once | INTRAVENOUS | Status: DC
  Administered 2014-12-17: 4 mg via INTRAVENOUS
  Filled 2014-12-17: qty 100

## 2014-12-17 MED ORDER — LACTULOSE SOLN: Status: DC

## 2014-12-17 MED ORDER — ALTEPLASE 2 MG IJ SOLR
2.0000 mg | Freq: Once | INTRAMUSCULAR | Status: DC | PRN
  Filled 2014-12-17: qty 2

## 2014-12-17 MED ORDER — PERTUZUMAB CHEMO INJECTION 420 MG/14ML
420.0000 mg | Freq: Once | INTRAVENOUS | Status: AC
  Administered 2014-12-17: 420 mg via INTRAVENOUS
  Filled 2014-12-17: qty 14

## 2014-12-17 MED ORDER — SODIUM CHLORIDE 0.9 % IJ SOLN
10.0000 mL | INTRAMUSCULAR | Status: DC | PRN
  Administered 2014-12-17: 10 mL
  Filled 2014-12-17: qty 10

## 2014-12-17 NOTE — Progress Notes (Signed)
Hematology and Oncology Follow Up Visit  Bianca Kent [id number] 29-Dec-1965 49 y.o. 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 We found that Bianca Kent did have a solitary CNS metastasis. We saw her a few weeks ago, Bianca Kent was having this nausea and vomiting which I cannot explain. I went ahead and got a MRI. This showed a solitary 3 x 4 mm lesion in the left posterior temporal cortex. There was a little bit of edema.  Bianca Kent simply underwent SRS. This was done yesterday. Bianca Kent did very well with this.  Bianca Kent actually looks quite well. Bianca Kent did have some recurrence of the nausea and vomiting. I'm still not sure as to why Bianca Kent's having this. I went ahead and gave her a prescription for Ativan to try. I also gave her a prescription for Zyprexa.  Bianca Kent is complaining of a little more discomfort under the right axilla. Her last scan was done back in December. It probably is time for another one.  Bianca Kent is not working. Bianca Kent is on disability. Her pain control has been doing pretty well.  Bianca Kent's had no mouth sores. This seems to be getting better.  Bianca Kent's had no cough. There's been no shortness of breath.  Bianca Kent's had some constipation. I will try her on some lactulose for this.   Currently, her performance status is ECOG 1  Medications:  Current outpatient prescriptions:  .  albuterol (PROVENTIL HFA;VENTOLIN HFA) 108 (90 BASE) MCG/ACT inhaler, Inhale 2 puffs into the lungs every 4 (four) hours as needed for wheezing or shortness of breath., Disp: 2 Inhaler, Rfl: 6 .  ALPRAZolam (XANAX) 1 MG tablet, Take 1 tablet (1 mg total) by mouth at bedtime as needed for anxiety., Disp: 30 tablet, Rfl: 2 .  aspirin  (ASPIRIN EC) 81 MG EC tablet, Take 81 mg by mouth daily. Swallow whole., Disp: , Rfl:  .  Bismuth Subsalicylate (PEPTO-BISMOL PO), Take by mouth as needed., Disp: , Rfl:  .  calcium carbonate (TUMS - DOSED IN MG ELEMENTAL CALCIUM) 500 MG chewable tablet, Chew 2 tablets by mouth 2 (two) times daily., Disp: , Rfl:  .  diphenhydrAMINE (SOMINEX) 25 MG tablet, Take 100 mg by mouth at bedtime as needed for sleep. Pt states Bianca Kent is taking up to 200 mg Benadryl at night and still not sleeping, Disp: , Rfl:  .  dronabinol (MARINOL) 10 MG capsule, Take 1 capsule (10 mg total) by mouth 3 (three) times daily before meals., Disp: 90 capsule, Rfl: 0 .  ergocalciferol (VITAMIN D2) 50000 UNITS capsule, Take 1 capsule (50,000 Units total) by mouth once a week., Disp: 4 capsule, Rfl: 6 .  fentaNYL (DURAGESIC) 50 MCG/HR, Place 1 patch (50 mcg total) onto the skin every 3 (three) days., Disp: 10 patch, Rfl: 0 .  HYDROmorphone (DILAUDID) 4 MG tablet, Take 1/2 - 1 pill, IF NEEDED, for pain every 4 hrs, Disp: 90 tablet, Rfl: 0 .  lidocaine (XYLOCAINE) 2 % solution, Use as directed 20 mLs in the mouth or throat as needed for mouth pain., Disp: 200 mL, Rfl: 2 .  lidocaine-prilocaine (EMLA) cream, Apply 1 application topically as needed. Placeonto port site 52mn  prior to treatment and cover with plastic wrap., Disp: 30 g, Rfl: 2 .  loratadine-pseudoephedrine (CLARITIN-D 24-HOUR) 10-240 MG per 24 hr tablet, Take 1 tablet by mouth daily., Disp: , Rfl:  .  methylphenidate (RITALIN) 10 MG tablet, Take 1 tablet (10 mg total) by mouth 2 (two) times daily., Disp: 60 tablet, Rfl: 0 .  metoCLOPramide (REGLAN) 10 MG tablet, Take 1 tablet (10 mg total) by mouth every 6 (six) hours as needed for nausea., Disp: 120 tablet, Rfl: 5 .  Multiple Vitamin (MULTIVITAMIN) tablet, Take 1 tablet by mouth daily., Disp: , Rfl:  .  naproxen sodium (ANAPROX) 220 MG tablet, Take 220 mg by mouth 2 (two) times daily with a meal., Disp: , Rfl:  .   ondansetron (ZOFRAN) 8 MG tablet, TAKE 1 TABLET BY MOUTH EVERY 8 HOURS AS NEEDED FOR NAUSEA OR VOMITING, Disp: 20 tablet, Rfl: 2 .  pantoprazole (PROTONIX) 40 MG tablet, Take 1 tablet (40 mg total) by mouth daily., Disp: 30 tablet, Rfl: 6 .  prochlorperazine (COMPAZINE) 10 MG tablet, Take 1 tablet (10 mg total) by mouth every 6 (six) hours as needed for nausea or vomiting., Disp: 60 tablet, Rfl: 2 .  promethazine (PHENERGAN) 25 MG suppository, Place 1 suppository (25 mg total) rectally every 6 (six) hours as needed for nausea or vomiting., Disp: 30 each, Rfl: 3 .  sulfamethoxazole-trimethoprim (BACTRIM DS,SEPTRA DS) 800-160 MG per tablet, Take 1 tablet by mouth 2 (two) times daily., Disp: 14 tablet, Rfl: 0 .  traZODone (DESYREL) 100 MG tablet, Take 1-2 if needed at bed for sleep., Disp: 30 tablet, Rfl: 2 .  Lactulose SOLN, Take 2 TBLSP every 6 hours until bowel movement, Disp: 1000 mL, Rfl: 4 .  LORazepam (ATIVAN) 1 MG tablet, Place 1 tablet under the tongue, IF NEEDED, every 6 hours for nausea., Disp: 60 tablet, Rfl: 2 .  OLANZapine (ZYPREXA) 5 MG tablet, Take 1 tablet (5 mg total) by mouth at bedtime., Disp: 30 tablet, Rfl: 3  Allergies:  Allergies  Allergen Reactions  . Iohexol      Code: RASH, Desc: VERY STRONG FAMILY HX OF ANGIOEDEMA WHEN RECEIVING IV CONTRAST; PT HAS BEEN PREMEDICATED FOR OTHER CONTRASTED STUDIES(IN CATH. LAB)  KR, Onset Date: [id number]   . Prednisone Itching    Capillary beds bust  . Tetanus Toxoids Other (See Comments)    Ran a high fever for 48 hours  . Theophyllines Hives    Mental changes  . Versed [Midazolam] Other (See Comments)    Pt becomes violent    Past Medical History, Surgical history, Social history, and Family History were reviewed and updated.  Review of Systems: As above  Physical Exam:  height is '5\' 5"'  (1.651 m) and weight is 164 lb (74.39 kg). Her oral temperature is 98.2 F (36.8 C). Her blood pressure is 107/77 and her pulse is 91. Her  respiration is 14.   Well-developed and well-nourished white female. Head and exam shows no ocular or oral lesions. There might be some slight fullness in the right axilla by do not detect any obvious adenopathy. I cannot detect any obvious right supraclavicular lymph nodes. Bianca Kent has improved range of motion of the right shoulder. Left supraclavicular region is okay. No masses are noted. Lungs are clear. Cardiac exam regular rate and rhythm with no murmurs, rubs or bruits.. Abdomen is soft. Bianca Kent has good bowel sounds. There is no palpable liver or spleen tip. Back exam shows decreased tenderness over the lumbar and thoracic  spine to palpation. Extremities shows no clubbing, cyanosis or edema. Bianca Kent has 4/5 strength in her legs. Skin exam shows improvement in the radiation dermatitis of the right breast and axilla. There is no open wound. There is some hyper pigmentation. Neurological exam is nonfocal.    Lab Results  Component Value Date   WBC 5.1 12/17/2014   HGB 13.3 12/17/2014   HCT 41.0 12/17/2014   MCV 87 12/17/2014   PLT 146 12/17/2014     Chemistry      Component Value Date/Time   NA 142 12/17/2014 0832   NA 136 03/24/2014 1147   K 3.6 12/17/2014 0832   K 4.2 03/24/2014 1147   CL 101 12/17/2014 0832   CL 105 03/24/2014 1147   CO2 28 12/17/2014 0832   CO2 21 03/24/2014 1147   BUN 8 12/17/2014 0832   BUN 15 03/24/2014 1147   CREATININE 0.7 12/17/2014 0832   CREATININE 0.83 03/24/2014 1147      Component Value Date/Time   CALCIUM 9.3 12/17/2014 0832   CALCIUM 8.8 03/24/2014 1147   ALKPHOS 180* 12/17/2014 0832   ALKPHOS 106 03/24/2014 1147   AST 42* 12/17/2014 0832   AST 19 03/24/2014 1147   ALT 24 12/17/2014 0832   ALT 28 03/24/2014 1147   BILITOT 0.50 12/17/2014 0832   BILITOT 0.5 03/24/2014 1147         Impression and Plan: Bianca Kent is 49 year old white female with triple positive metastatic breast cancer. We now have her on Kadcyla. Bianca Kent tolerated her first 6  cycles well. We will repeat the PET scan to see how that looks.  I am glad that Bianca Kent got through the radiosurgery without any issues. Bianca Kent will be followed up with an MRI in about 3 months.  I had a long talk with Bianca Kent and her sister. Her sister is really nice and incredibly dedicated to Bianca Kent. We talked about the issues with the brain met. I told them that I didn't think that it would affect her prognosis.  I will give her viscous lidocaine to try to help with her mouth. Hopefully, this will help her swallow a little better.  Bianca Kent is on Marinol. We will keep her on Marinol. We will have her come back next week. Hopefully if Bianca Kent is doing better, we will treat her.  I have noted that her alkaline phosphatase is going up a little bit more. This might be some that were going to have to watch closely.  I spent about 35 minutes with Bianca Kent and her sister. As always, we had a great prayer session. [name], MD 2/25/20166:29 PM

## 2014-12-17 NOTE — Patient Instructions (Signed)
Pertuzumab injection What is this medicine? PERTUZUMAB (per TOOZ ue mab) is a monoclonal antibody that targets a protein called HER2. HER2 is found in some breast cancers. This medicine can stop cancer cell growth. This medicine is used with other cancer treatments. This medicine may be used for other purposes; ask your health care provider or pharmacist if you have questions. COMMON BRAND NAME(S): PERJETA What should I tell my health care provider before I take this medicine? They need to know if you have any of these conditions: -heart disease -heart failure -high blood pressure -history of irregular heart beat -recent or ongoing radiation therapy -an unusual or allergic reaction to pertuzumab, other medicines, foods, dyes, or preservatives -pregnant or trying to get pregnant -breast-feeding How should I use this medicine? This medicine is for infusion into a vein. It is given by a health care professional in a hospital or clinic setting. Talk to your pediatrician regarding the use of this medicine in children. Special care may be needed. Overdosage: If you think you've taken too much of this medicine contact a poison control center or emergency room at once. Overdosage: If you think you have taken too much of this medicine contact a poison control center or emergency room at once. NOTE: This medicine is only for you. Do not share this medicine with others. What if I miss a dose? It is important not to miss your dose. Call your doctor or health care professional if you are unable to keep an appointment. What may interact with this medicine? Interactions are not expected. Give your health care provider a list of all the medicines, herbs, non-prescription drugs, or dietary supplements you use. Also tell them if you smoke, drink alcohol, or use illegal drugs. Some items may interact with your medicine. This list may not describe all possible interactions. Give your health care provider a  list of all the medicines, herbs, non-prescription drugs, or dietary supplements you use. Also tell them if you smoke, drink alcohol, or use illegal drugs. Some items may interact with your medicine. What should I watch for while using this medicine? Your condition will be monitored carefully while you are receiving this medicine. Report any side effects. Continue your course of treatment even though you feel ill unless your doctor tells you to stop. Do not become pregnant while taking this medicine. Women should inform their doctor if they wish to become pregnant or think they might be pregnant. There is a potential for serious side effects to an unborn child. Talk to your health care professional or pharmacist for more information. Do not breast-feed an infant while taking this medicine. Call your doctor or health care professional for advice if you get a fever, chills or sore throat, or other symptoms of a cold or flu. Do not treat yourself. Try to avoid being around people who are sick. You may experience fever, chills, and headache during the infusion. Report any side effects during the infusion to your health care professional. What side effects may I notice from receiving this medicine? Side effects that you should report to your doctor or health care professional as soon as possible: -breathing problems -chest pain or palpitations -dizziness -feeling faint or lightheaded -fever or chills -skin rash, itching or hives -sore throat -swelling of the face, lips, or tongue -swelling of the legs or ankles -unusually weak or tired Side effects that usually do not require medical attention (Report these to your doctor or health care professional if they continue or  are bothersome.): -diarrhea -hair loss -nausea, vomiting -tiredness This list may not describe all possible side effects. Call your doctor for medical advice about side effects. You may report side effects to FDA at  1-800-FDA-1088. Where should I keep my medicine? This drug is given in a hospital or clinic and will not be stored at home. NOTE: This sheet is a summary. It may not cover all possible information. If you have questions about this medicine, talk to your doctor, pharmacist, or health care provider.  2015, Elsevier/Gold Standard. (2012-08-07 16:54:15) Ado-Trastuzumab Emtansine for injection What is this medicine? Ado-trastuzumab emtansine (ADD oh traz TOO zuh mab em TAN zine) is a monoclonal antibody combined with chemotherapy. It targets a protein called HER2. This protein is found in some stomach and breast cancers. This medicine can stop cancer cell growth. This medicine may be used for other purposes; ask your health care provider or pharmacist if you have questions. COMMON BRAND NAME(S): Kadcyla What should I tell my health care provider before I take this medicine? They need to know if you have any of these conditions: -heart disease -heart failure -infection (especially a virus infection such as chickenpox, cold sores, or herpes) -liver disease -lung or breathing disease, like asthma -an unusual or allergic reaction to ado-trastuzumab emtansine, other medications, foods, dyes, or preservatives -pregnant or trying to get pregnant -breast-feeding How should I use this medicine? This medicine is for infusion into a vein. It is given by a health care professional in a hospital or clinic setting. Talk to your pediatrician regarding the use of this medicine in children. Special care may be needed. Overdosage: If you think you've taken too much of this medicine contact a poison control center or emergency room at once. Overdosage: If you think you have taken too much of this medicine contact a poison control center or emergency room at once. NOTE: This medicine is only for you. Do not share this medicine with others. What if I miss a dose? It is important not to miss your dose. Call your  doctor or health care professional if you are unable to keep an appointment. What may interact with this medicine? This medicine may also interact with the following medications: -atazanavir -boceprevir -clarithromycin -delavirdine -indinavir -dalfopristin; quinupristin -isoniazid, INH -itraconazole -ketoconazole -nefazodone -nelfinavir -ritonavir -telaprevir -telithromycin -tipranavir -voriconazole This list may not describe all possible interactions. Give your health care provider a list of all the medicines, herbs, non-prescription drugs, or dietary supplements you use. Also tell them if you smoke, drink alcohol, or use illegal drugs. Some items may interact with your medicine. What should I watch for while using this medicine? Visit your doctor for checks on your progress. This drug may make you feel generally unwell. This is not uncommon, as chemotherapy can affect healthy cells as well as cancer cells. Report any side effects. Continue your course of treatment even though you feel ill unless your doctor tells you to stop. Call your doctor or health care professional for advice if you get a fever, chills or sore throat, or other symptoms of a cold or flu. Do not treat yourself. This drug decreases your body's ability to fight infections. Try to avoid being around people who are sick. Be careful brushing and flossing your teeth or using a toothpick because you may get an infection or bleed more easily. If you have any dental work done, tell your dentist you are receiving this medicine. Avoid taking products that contain aspirin, acetaminophen, ibuprofen, naproxen, or  ketoprofen unless instructed by your doctor. These medicines may hide a fever. Do not become pregnant while taking this medicine. Women should inform their doctor if they wish to become pregnant or think they might be pregnant. There is a potential for serious side effects to an unborn child. [Men should inform their doctors  if they wish to father a child. This medicine may lower sperm counts.] Talk to your health care professional or pharmacist for more information. Do not breast-feed an infant while taking this medicine. You may need blood work done while you are taking this medicine. What side effects may I notice from receiving this medicine? Side effects that you should report to your doctor or health care professional as soon as possible: -allergic reactions like skin rash, itching or hives, swelling of the face, lips, or tongue -breathing problems -chest pain or palpitations -fever or chills, sore throat -general ill feeling or flu-like symptoms -light-colored stools -nausea, vomiting -pain, tingling, numbness in the hands or feet -signs and symptoms of bleeding such as bloody or black, tarry stools; red or dark-brown urine; spitting up blood or brown material that looks like coffee grounds; red spots on the skin; unusual bruising or bleeding from the eye, gums, or nose -swelling of the legs or ankles -yellowing of the eyes or skin Side effects that usually do not require medical attention (Report these to your doctor or health care professional if they continue or are bothersome.): -changes in taste -constipation -dizziness -headache -joint pain -muscle pain -trouble sleeping -unusually weak or tired This list may not describe all possible side effects. Call your doctor for medical advice about side effects. You may report side effects to FDA at 1-800-FDA-1088. Where should I keep my medicine? This drug is given in a hospital or clinic and will not be stored at home. NOTE: This sheet is a summary. It may not cover all possible information. If you have questions about this medicine, talk to your doctor, pharmacist, or health care provider.  2015, Elsevier/Gold Standard. (2013-02-18 12:55:10)  

## 2014-12-18 ENCOUNTER — Telehealth: Payer: Self-pay | Admitting: Hematology & Oncology

## 2014-12-18 NOTE — Telephone Encounter (Signed)
Pt aware of 3-1 PET (ok per MD to do next week) to be NPO 6 hrs and 3-17 tx appointment

## 2014-12-18 NOTE — Progress Notes (Signed)
  Radiation Oncology         [phone] ________________________________  Name: Bianca Kent MRN: [id number]  Date: 12/16/2014  DOB: 07-22-1966  End of Treatment Note  Diagnosis:   Brain metastasis, Grade III triple positive breast cancer   Indication for treatment:  palliative       Radiation treatment dates:   12/16/2014  Site/dose:   Left temporal 35mm tumor / 20 Gy in 1 fraction  Beams/energy:   Stereotactic radiosurgery with 3 circular cone arcs /  6 FFF MV photons  Narrative: The patient tolerated radiation treatment relatively well.      Plan: The patient has completed radiation treatment. The patient will return to radiation oncology clinic for routine followup in one month. I advised them to call or return sooner if they have any questions or concerns related to their recovery or treatment.  -----------------------------------  Eppie Gibson, MD

## 2014-12-22 ENCOUNTER — Other Ambulatory Visit: Payer: Self-pay | Admitting: Hematology & Oncology

## 2014-12-22 ENCOUNTER — Ambulatory Visit (HOSPITAL_COMMUNITY)
Admission: RE | Admit: 2014-12-22 | Discharge: 2014-12-22 | Disposition: A | Discharge status: Home / Self Care | Payer: 59 | Source: Ambulatory Visit | Attending: Hematology & Oncology | Admitting: Hematology & Oncology

## 2014-12-22 DIAGNOSIS — C7951 Secondary malignant neoplasm of bone: Secondary | ICD-10-CM | POA: Insufficient documentation

## 2014-12-22 DIAGNOSIS — R918 Other nonspecific abnormal finding of lung field: Secondary | ICD-10-CM | POA: Insufficient documentation

## 2014-12-22 DIAGNOSIS — R64 Cachexia: Secondary | ICD-10-CM

## 2014-12-22 DIAGNOSIS — G4701 Insomnia due to medical condition: Secondary | ICD-10-CM

## 2014-12-22 DIAGNOSIS — C787 Secondary malignant neoplasm of liver and intrahepatic bile duct: Secondary | ICD-10-CM | POA: Diagnosis not present

## 2014-12-22 DIAGNOSIS — C7931 Secondary malignant neoplasm of brain: Secondary | ICD-10-CM

## 2014-12-22 DIAGNOSIS — C50911 Malignant neoplasm of unspecified site of right female breast: Secondary | ICD-10-CM

## 2014-12-22 DIAGNOSIS — R5383 Other fatigue: Secondary | ICD-10-CM

## 2014-12-22 DIAGNOSIS — C773 Secondary and unspecified malignant neoplasm of axilla and upper limb lymph nodes: Secondary | ICD-10-CM | POA: Insufficient documentation

## 2014-12-22 DIAGNOSIS — C50919 Malignant neoplasm of unspecified site of unspecified female breast: Secondary | ICD-10-CM | POA: Insufficient documentation

## 2014-12-22 LAB — GLUCOSE, CAPILLARY
GLUCOSE-CAPILLARY: 41 mg/dL — AB (ref 70–99)
Glucose-Capillary: 93 mg/dL (ref 70–99)

## 2014-12-22 MED ORDER — FLUDEOXYGLUCOSE F - 18 (FDG) INJECTION
8.0900 | Freq: Once | INTRAVENOUS | Status: AC | PRN
  Administered 2014-12-22: 8.09 via INTRAVENOUS

## 2014-12-31 ENCOUNTER — Telehealth: Payer: Self-pay | Admitting: Oncology

## 2014-12-31 ENCOUNTER — Ambulatory Visit: Payer: 59 | Admitting: Radiation Oncology

## 2014-12-31 NOTE — Telephone Encounter (Signed)
Called Bianca Kent regarding her appointment today with Dr. Sondra Come.  [name] said the appointment was supposed to be canceled because she is following up with Dr. Isidore Moos 01/25/15.  She said she is doing fine and her skin does not have any irritation.  Advised her to call if she needs anything.

## 2015-01-04 ENCOUNTER — Telehealth: Payer: Self-pay | Admitting: Hematology & Oncology

## 2015-01-04 NOTE — Telephone Encounter (Signed)
Faxed [employer] DISABILITY papers today to:  Policy: [id number] Claim: [id number]  Valid: 08/23/2014 - present    F: [phone] P: [phone]    COPY SCANNED         COPY SCANNED

## 2015-01-07 ENCOUNTER — Encounter: Payer: Self-pay | Admitting: Hematology & Oncology

## 2015-01-07 ENCOUNTER — Ambulatory Visit (HOSPITAL_BASED_OUTPATIENT_CLINIC_OR_DEPARTMENT_OTHER): Payer: 59 | Admitting: Hematology & Oncology

## 2015-01-07 ENCOUNTER — Ambulatory Visit (HOSPITAL_BASED_OUTPATIENT_CLINIC_OR_DEPARTMENT_OTHER): Payer: 59

## 2015-01-07 ENCOUNTER — Other Ambulatory Visit (HOSPITAL_BASED_OUTPATIENT_CLINIC_OR_DEPARTMENT_OTHER): Payer: 59 | Admitting: Lab

## 2015-01-07 VITALS — BP 114/73 | HR 86 | Temp 97.7°F | Resp 14 | Ht 65.0 in | Wt 162.0 lb

## 2015-01-07 DIAGNOSIS — R5383 Other fatigue: Secondary | ICD-10-CM

## 2015-01-07 DIAGNOSIS — Z5112 Encounter for antineoplastic immunotherapy: Secondary | ICD-10-CM

## 2015-01-07 DIAGNOSIS — C50912 Malignant neoplasm of unspecified site of left female breast: Secondary | ICD-10-CM

## 2015-01-07 DIAGNOSIS — C7951 Secondary malignant neoplasm of bone: Secondary | ICD-10-CM

## 2015-01-07 DIAGNOSIS — R64 Cachexia: Secondary | ICD-10-CM

## 2015-01-07 DIAGNOSIS — C7931 Secondary malignant neoplasm of brain: Secondary | ICD-10-CM

## 2015-01-07 DIAGNOSIS — C50911 Malignant neoplasm of unspecified site of right female breast: Secondary | ICD-10-CM

## 2015-01-07 DIAGNOSIS — G4701 Insomnia due to medical condition: Secondary | ICD-10-CM

## 2015-01-07 DIAGNOSIS — Z170 Estrogen receptor positive status [ER+]: Secondary | ICD-10-CM

## 2015-01-07 LAB — CMP (CANCER CENTER ONLY)
ALK PHOS: 256 U/L — AB (ref 26–84)
ALT(SGPT): 37 U/L (ref 10–47)
AST: 52 U/L — ABNORMAL HIGH (ref 11–38)
Albumin: 3.5 g/dL (ref 3.3–5.5)
BILIRUBIN TOTAL: 0.6 mg/dL (ref 0.20–1.60)
BUN, Bld: 6 mg/dL — ABNORMAL LOW (ref 7–22)
CALCIUM: 9.1 mg/dL (ref 8.0–10.3)
CHLORIDE: 105 meq/L (ref 98–108)
CO2: 27 mEq/L (ref 18–33)
Creat: 0.8 mg/dl (ref 0.6–1.2)
GLUCOSE: 96 mg/dL (ref 73–118)
Potassium: 3.5 mEq/L (ref 3.3–4.7)
Sodium: 139 mEq/L (ref 128–145)
Total Protein: 8.7 g/dL — ABNORMAL HIGH (ref 6.4–8.1)

## 2015-01-07 LAB — CBC WITH DIFFERENTIAL (CANCER CENTER ONLY)
BASO#: 0 10*3/uL (ref 0.0–0.2)
BASO%: 0.5 % (ref 0.0–2.0)
EOS ABS: 0.1 10*3/uL (ref 0.0–0.5)
EOS%: 1.7 % (ref 0.0–7.0)
HEMATOCRIT: 41.6 % (ref 34.8–46.6)
HGB: 13.7 g/dL (ref 11.6–15.9)
LYMPH#: 1 10*3/uL (ref 0.9–3.3)
LYMPH%: 17.3 % (ref 14.0–48.0)
MCH: 28 pg (ref 26.0–34.0)
MCHC: 32.9 g/dL (ref 32.0–36.0)
MCV: 85 fL (ref 81–101)
MONO#: 0.6 10*3/uL (ref 0.1–0.9)
MONO%: 10.3 % (ref 0.0–13.0)
NEUT#: 4.1 10*3/uL (ref 1.5–6.5)
NEUT%: 70.2 % (ref 39.6–80.0)
Platelets: 146 10*3/uL (ref 145–400)
RBC: 4.89 10*6/uL (ref 3.70–5.32)
RDW: 18.3 % — ABNORMAL HIGH (ref 11.1–15.7)
WBC: 5.8 10*3/uL (ref 3.9–10.0)

## 2015-01-07 MED ORDER — DIPHENHYDRAMINE HCL 25 MG PO CAPS
ORAL_CAPSULE | ORAL | Status: AC
  Filled 2015-01-07: qty 2

## 2015-01-07 MED ORDER — ZOLEDRONIC ACID 4 MG/100ML IV SOLN
4.0000 mg | Freq: Once | INTRAVENOUS | Status: AC
  Administered 2015-01-07: 4 mg via INTRAVENOUS
  Filled 2015-01-07: qty 100

## 2015-01-07 MED ORDER — ACETAMINOPHEN 325 MG PO TABS
ORAL_TABLET | ORAL | Status: AC
  Filled 2015-01-07: qty 2

## 2015-01-07 MED ORDER — HEPARIN SOD (PORK) LOCK FLUSH 100 UNIT/ML IV SOLN
500.0000 [IU] | Freq: Once | INTRAVENOUS | Status: AC | PRN
  Administered 2015-01-07: 500 [IU]
  Filled 2015-01-07: qty 5

## 2015-01-07 MED ORDER — ACETAMINOPHEN 325 MG PO TABS
650.0000 mg | ORAL_TABLET | Freq: Once | ORAL | Status: AC
  Administered 2015-01-07: 650 mg via ORAL

## 2015-01-07 MED ORDER — DIPHENHYDRAMINE HCL 25 MG PO CAPS
50.0000 mg | ORAL_CAPSULE | Freq: Once | ORAL | Status: AC
  Administered 2015-01-07: 50 mg via ORAL

## 2015-01-07 MED ORDER — PALONOSETRON HCL INJECTION 0.25 MG/5ML
0.2500 mg | Freq: Once | INTRAVENOUS | Status: AC
  Administered 2015-01-07: 0.25 mg via INTRAVENOUS

## 2015-01-07 MED ORDER — PERTUZUMAB CHEMO INJECTION 420 MG/14ML
420.0000 mg | Freq: Once | INTRAVENOUS | Status: AC
  Administered 2015-01-07: 420 mg via INTRAVENOUS
  Filled 2015-01-07: qty 14

## 2015-01-07 MED ORDER — SODIUM CHLORIDE 0.9 % IJ SOLN
10.0000 mL | INTRAMUSCULAR | Status: DC | PRN
  Administered 2015-01-07: 10 mL
  Filled 2015-01-07: qty 10

## 2015-01-07 MED ORDER — SODIUM CHLORIDE 0.9 % IV SOLN
Freq: Once | INTRAVENOUS | Status: AC
  Administered 2015-01-07: 11:00:00 via INTRAVENOUS

## 2015-01-07 MED ORDER — ADO-TRASTUZUMAB EMTANSINE CHEMO INJECTION 160 MG
3.6000 mg/kg | Freq: Once | INTRAVENOUS | Status: AC
  Administered 2015-01-07: 300 mg via INTRAVENOUS
  Filled 2015-01-07: qty 15

## 2015-01-07 MED ORDER — PALONOSETRON HCL INJECTION 0.25 MG/5ML
INTRAVENOUS | Status: AC
  Filled 2015-01-07: qty 5

## 2015-01-07 MED ORDER — SODIUM CHLORIDE 0.9 % IV SOLN
150.0000 mg | Freq: Once | INTRAVENOUS | Status: AC
  Administered 2015-01-07: 150 mg via INTRAVENOUS
  Filled 2015-01-07: qty 5

## 2015-01-07 NOTE — Progress Notes (Signed)
Hematology and Oncology Follow Up Visit  Bianca Kent [id number] [birth year] 49 y.o. 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 is back for followup . She is doing Chief Strategy Officer. We did a PET scan on her. Surprisingly enough, the PET scan did not show any obvious active disease. This is truly amazing. I'm this grateful that she has done so well.  Her weight is holding pretty steady. The nausea that she's had is improving a little bit.  She still has the soreness of the tongue. I'm not sure as to what is causing this. I'll have to see if Bianca Kent is doing this. If it is, I would not want to stop the Kadcyla since she appears to be in remission.  She's not having as much pain. She's not having as much pain under the right arm.  Her bowel or bladder habits seen to be relatively normal.  Bianca Kent is helping out quite a bit with nausea and with her appetite. I'm glad that she is responding to this.  She's had no bleeding. She's had no headache. She had the [facility] procedure and studies be getting through this.  Her CA 27.29 was 22 back in February. This really never has been all that high.   Currently, her performance status is ECOG 1  Medications:  Current outpatient prescriptions:  .  albuterol (PROVENTIL HFA;VENTOLIN HFA) 108 (90 BASE) MCG/ACT inhaler, Inhale 2 puffs into the lungs every 4 (four) hours as needed for wheezing or shortness of breath., Disp: 2 Inhaler, Rfl: 6 .  ALPRAZolam (XANAX) 1 MG tablet, Take 1 tablet (1 mg total) by mouth at bedtime as needed for anxiety., Disp: 30 tablet, Rfl: 2 .  aspirin (ASPIRIN EC) 81 MG EC tablet, Take 81 mg by mouth daily. Swallow whole., Disp: , Rfl:  .  Bismuth  Subsalicylate (PEPTO-BISMOL PO), Take by mouth as needed., Disp: , Rfl:  .  calcium carbonate (TUMS - DOSED IN MG ELEMENTAL CALCIUM) 500 MG chewable tablet, Chew 2 tablets by mouth 2 (two) times daily., Disp: , Rfl:  .  diphenhydrAMINE (SOMINEX) 25 MG tablet, Take 100 mg by mouth at bedtime as needed for sleep. Pt states she is taking up to 200 mg Benadryl at night and still not sleeping, Disp: , Rfl:  .  dronabinol (Bianca Kent) 10 MG capsule, Take 1 capsule (10 mg total) by mouth 3 (three) times daily before meals., Disp: 90 capsule, Rfl: 0 .  ergocalciferol (VITAMIN D2) 50000 UNITS capsule, Take 1 capsule (50,000 Units total) by mouth once a week., Disp: 4 capsule, Rfl: 6 .  fentaNYL (DURAGESIC) 50 MCG/HR, Place 1 patch (50 mcg total) onto the skin every 3 (three) days., Disp: 10 patch, Rfl: 0 .  HYDROmorphone (DILAUDID) 4 MG tablet, Take 1/2 - 1 pill, IF NEEDED, for pain every 4 hrs, Disp: 90 tablet, Rfl: 0 .  Lactulose SOLN, Take 2 TBLSP every 6 hours until bowel movement, Disp: 1000 mL, Rfl: 4 .  lidocaine (XYLOCAINE) 2 % solution, Use as directed 20 mLs in the mouth or throat as needed for mouth pain., Disp: 200 mL, Rfl: 2 .  lidocaine-prilocaine (EMLA) cream, Apply 1 application topically as needed. Placeonto  port site 70min prior to treatment and cover with plastic wrap., Disp: 30 g, Rfl: 2 .  loratadine-pseudoephedrine (CLARITIN-D 24-HOUR) 10-240 MG per 24 hr tablet, Take 1 tablet by mouth daily., Disp: , Rfl:  .  LORazepam (ATIVAN) 1 MG tablet, Place 1 tablet under the tongue, IF NEEDED, every 6 hours for nausea., Disp: 60 tablet, Rfl: 2 .  methylphenidate (RITALIN) 10 MG tablet, Take 1 tablet (10 mg total) by mouth 2 (two) times daily., Disp: 60 tablet, Rfl: 0 .  metoCLOPramide (REGLAN) 10 MG tablet, Take 1 tablet (10 mg total) by mouth every 6 (six) hours as needed for nausea., Disp: 120 tablet, Rfl: 5 .  Multiple Vitamin (MULTIVITAMIN) tablet, Take 1 tablet by mouth daily., Disp: , Rfl:  .   naproxen sodium (ANAPROX) 220 MG tablet, Take 220 mg by mouth 2 (two) times daily with a meal., Disp: , Rfl:  .  NONFORMULARY OR COMPOUNDED ITEM, Take by mouth as needed. DUKES MOUTHWASH  SWISH AND SWALLOW, Disp: , Rfl:  .  OLANZapine (ZYPREXA) 5 MG tablet, Take 1 tablet (5 mg total) by mouth at bedtime., Disp: 30 tablet, Rfl: 3 .  ondansetron (ZOFRAN) 8 MG tablet, TAKE 1 TABLET BY MOUTH EVERY 8 HOURS AS NEEDED FOR NAUSEA OR VOMITING, Disp: 20 tablet, Rfl: 2 .  pantoprazole (PROTONIX) 40 MG tablet, Take 1 tablet (40 mg total) by mouth daily., Disp: 30 tablet, Rfl: 6 .  prochlorperazine (COMPAZINE) 10 MG tablet, Take 1 tablet (10 mg total) by mouth every 6 (six) hours as needed for nausea or vomiting., Disp: 60 tablet, Rfl: 2 .  promethazine (PHENERGAN) 25 MG suppository, Place 1 suppository (25 mg total) rectally every 6 (six) hours as needed for nausea or vomiting., Disp: 30 each, Rfl: 3 .  traZODone (DESYREL) 100 MG tablet, Take 1-2 if needed at bed for sleep., Disp: 30 tablet, Rfl: 2 No current facility-administered medications for this visit.  Facility-Administered Medications Ordered in Other Visits:  .  sodium chloride 0.9 % injection 10 mL, 10 mL, Intracatheter, PRN, [name], MD, 10 mL at 01/07/15 1515  Allergies:  Allergies  Allergen Reactions  . Iohexol      Code: RASH, Desc: VERY STRONG FAMILY HX OF ANGIOEDEMA WHEN RECEIVING IV CONTRAST; PT HAS BEEN PREMEDICATED FOR OTHER CONTRASTED STUDIES(IN CATH. LAB)  KR, Onset Date: [id number]   . Prednisone Itching    Capillary beds bust  . Tetanus Toxoids Other (See Comments)    Ran a high fever for 48 hours  . Theophyllines Hives    Mental changes  . Versed [Midazolam] Other (See Comments)    Pt becomes violent    Past Medical History, Surgical history, Social history, and Family History were reviewed and updated.  Review of Systems: As above  Physical Exam:  height is 5\' 5"  (1.651 m) and weight is 162 lb (73.483 kg). Her  oral temperature is 97.7 F (36.5 C). Her blood pressure is 114/73 and her pulse is 86. Her respiration is 14.   Well-developed and well-nourished white female. Head and exam shows no ocular or oral lesions. There might be some slight fullness in the right axilla by do not detect any obvious adenopathy. I cannot detect any obvious right supraclavicular lymph nodes. She has improved range of motion of the right shoulder. Left supraclavicular region is okay. No masses are noted. Lungs are clear. Cardiac exam regular rate and rhythm with no murmurs, rubs or bruits.. Abdomen is soft. She has good bowel  sounds. There is no palpable liver or spleen tip. Back exam shows decreased tenderness over the lumbar and thoracic spine to palpation. Extremities shows no clubbing, cyanosis or edema. She has 4/5 strength in her legs. Skin exam shows improvement in the radiation dermatitis of the right breast and axilla. There is no open wound. There is some hyper pigmentation. Neurological exam is nonfocal.    Lab Results  Component Value Date   WBC 5.8 01/07/2015   HGB 13.7 01/07/2015   HCT 41.6 01/07/2015   MCV 85 01/07/2015   PLT 146 01/07/2015     Chemistry      Component Value Date/Time   NA 139 01/07/2015 0910   NA 136 03/24/2014 1147   K 3.5 01/07/2015 0910   K 4.2 03/24/2014 1147   CL 105 01/07/2015 0910   CL 105 03/24/2014 1147   CO2 27 01/07/2015 0910   CO2 21 03/24/2014 1147   BUN 6* 01/07/2015 0910   BUN 15 03/24/2014 1147   CREATININE 0.8 01/07/2015 0910   CREATININE 0.83 03/24/2014 1147      Component Value Date/Time   CALCIUM 9.1 01/07/2015 0910   CALCIUM 8.8 03/24/2014 1147   ALKPHOS 256* 01/07/2015 0910   ALKPHOS 106 03/24/2014 1147   AST 52* 01/07/2015 0910   AST 19 03/24/2014 1147   ALT 37 01/07/2015 0910   ALT 28 03/24/2014 1147   BILITOT 0.60 01/07/2015 0910   BILITOT 0.5 03/24/2014 1147         Impression and Plan: Bianca Kent is 49 year old white female with triple  positive metastatic breast cancer. We now have her on Kadcyla. She tolerated her first 7 cycles well. This clearly has helped as the PET scan was pretty much normal. I'm sure that she still has microscopic disease. As such, I really think that we have to continue to treat with Kadcyla.  She does need to have a echocardiogram done. Her last was done back in December so I think another one would be appropriate to make sure her cardiac function is adequate.  She is on Bianca Kent. We will keep her on Bianca Kent. She actually is able to find the "real" product that she feels is helpful. I have noted that her alkaline phosphatase is going up a little bit more. This is been on the elevated side and I am not sure exactly what this signifies but we will continue to watch this.  I spent about 35 minutes with she and her sister. As always, we had a great prayer session. [name], MD 3/17/20164:20 PM

## 2015-01-07 NOTE — Patient Instructions (Signed)
[facility] Discharge Instructions for Patients Receiving Chemotherapy  Today you received the following chemotherapy agents Herceptin, Perjeta  To help prevent nausea and vomiting after your treatment, we encourage you to take your nausea medication    If you develop nausea and vomiting that is not controlled by your nausea medication, call the clinic.   BELOW ARE SYMPTOMS THAT SHOULD BE REPORTED IMMEDIATELY:  *FEVER GREATER THAN 100.5 F  *CHILLS WITH OR WITHOUT FEVER  NAUSEA AND VOMITING THAT IS NOT CONTROLLED WITH YOUR NAUSEA MEDICATION  *UNUSUAL SHORTNESS OF BREATH  *UNUSUAL BRUISING OR BLEEDING  TENDERNESS IN MOUTH AND THROAT WITH OR WITHOUT PRESENCE OF ULCERS  *URINARY PROBLEMS  *BOWEL PROBLEMS  UNUSUAL RASH Items with * indicate a potential emergency and should be followed up as soon as possible.  Feel free to call the clinic you have any questions or concerns. The clinic phone number is [phone].  Please show the [facility] at check to the Emergency Department and triage nurse.

## 2015-01-08 ENCOUNTER — Telehealth: Payer: Self-pay | Admitting: Hematology & Oncology

## 2015-01-08 LAB — LACTATE DEHYDROGENASE: LDH: 172 U/L (ref 94–250)

## 2015-01-08 LAB — CANCER ANTIGEN 27.29: CA 27.29: 27 U/mL (ref 0–39)

## 2015-01-08 NOTE — Telephone Encounter (Signed)
Pt aware of 3-30 echo

## 2015-01-13 ENCOUNTER — Telehealth: Payer: Self-pay | Admitting: Hematology & Oncology

## 2015-01-13 ENCOUNTER — Other Ambulatory Visit: Payer: Self-pay | Admitting: *Deleted

## 2015-01-13 ENCOUNTER — Telehealth: Payer: Self-pay | Admitting: *Deleted

## 2015-01-13 DIAGNOSIS — C50912 Malignant neoplasm of unspecified site of left female breast: Secondary | ICD-10-CM

## 2015-01-13 NOTE — Telephone Encounter (Signed)
Pt and RN aware of 3-30 MRI

## 2015-01-13 NOTE — Telephone Encounter (Signed)
Patient with numbness and tingling to her right hand and fingers. Notified Dr [name] and order for MRI received. Notified patient and scheduler of orders.

## 2015-01-19 ENCOUNTER — Encounter: Payer: Self-pay | Admitting: *Deleted

## 2015-01-20 ENCOUNTER — Ambulatory Visit (HOSPITAL_COMMUNITY)
Admission: RE | Admit: 2015-01-20 | Discharge: 2015-01-20 | Disposition: A | Discharge status: Home / Self Care | Payer: 59 | Source: Ambulatory Visit | Attending: Hematology & Oncology | Admitting: Hematology & Oncology

## 2015-01-20 DIAGNOSIS — I071 Rheumatic tricuspid insufficiency: Secondary | ICD-10-CM | POA: Insufficient documentation

## 2015-01-20 DIAGNOSIS — C7931 Secondary malignant neoplasm of brain: Secondary | ICD-10-CM | POA: Insufficient documentation

## 2015-01-20 DIAGNOSIS — C50912 Malignant neoplasm of unspecified site of left female breast: Secondary | ICD-10-CM

## 2015-01-20 DIAGNOSIS — R200 Anesthesia of skin: Secondary | ICD-10-CM | POA: Insufficient documentation

## 2015-01-20 DIAGNOSIS — Z5111 Encounter for antineoplastic chemotherapy: Secondary | ICD-10-CM | POA: Diagnosis not present

## 2015-01-20 DIAGNOSIS — J341 Cyst and mucocele of nose and nasal sinus: Secondary | ICD-10-CM | POA: Insufficient documentation

## 2015-01-20 MED ORDER — GADOBENATE DIMEGLUMINE 529 MG/ML IV SOLN
15.0000 mL | Freq: Once | INTRAVENOUS | Status: AC | PRN
  Administered 2015-01-20: 15 mL via INTRAVENOUS

## 2015-01-20 MED ORDER — HEPARIN SOD (PORK) LOCK FLUSH 100 UNIT/ML IV SOLN
500.0000 [IU] | INTRAVENOUS | Status: AC | PRN
  Administered 2015-01-20: 500 [IU]

## 2015-01-20 NOTE — Progress Notes (Signed)
Echocardiogram 2D Echocardiogram has been performed.  Bianca Kent 01/20/2015, 10:19 AM

## 2015-01-25 ENCOUNTER — Other Ambulatory Visit (HOSPITAL_BASED_OUTPATIENT_CLINIC_OR_DEPARTMENT_OTHER): Payer: 59

## 2015-01-25 ENCOUNTER — Other Ambulatory Visit: Payer: Self-pay | Admitting: Hematology & Oncology

## 2015-01-25 ENCOUNTER — Other Ambulatory Visit: Payer: Self-pay | Admitting: *Deleted

## 2015-01-25 ENCOUNTER — Ambulatory Visit (HOSPITAL_BASED_OUTPATIENT_CLINIC_OR_DEPARTMENT_OTHER): Payer: 59 | Admitting: Family

## 2015-01-25 ENCOUNTER — Ambulatory Visit: Admission: RE | Admit: 2015-01-25 | Payer: 59 | Source: Ambulatory Visit | Admitting: Radiation Oncology

## 2015-01-25 ENCOUNTER — Telehealth: Payer: Self-pay | Admitting: *Deleted

## 2015-01-25 ENCOUNTER — Ambulatory Visit (HOSPITAL_BASED_OUTPATIENT_CLINIC_OR_DEPARTMENT_OTHER): Payer: 59

## 2015-01-25 DIAGNOSIS — R110 Nausea: Secondary | ICD-10-CM

## 2015-01-25 DIAGNOSIS — R5383 Other fatigue: Secondary | ICD-10-CM

## 2015-01-25 DIAGNOSIS — C50912 Malignant neoplasm of unspecified site of left female breast: Secondary | ICD-10-CM | POA: Diagnosis not present

## 2015-01-25 DIAGNOSIS — M2550 Pain in unspecified joint: Secondary | ICD-10-CM

## 2015-01-25 DIAGNOSIS — C7931 Secondary malignant neoplasm of brain: Secondary | ICD-10-CM

## 2015-01-25 LAB — CBC WITH DIFFERENTIAL (CANCER CENTER ONLY)
BASO#: 0 10*3/uL (ref 0.0–0.2)
BASO%: 0.7 % (ref 0.0–2.0)
EOS%: 2.3 % (ref 0.0–7.0)
Eosinophils Absolute: 0.1 10*3/uL (ref 0.0–0.5)
HEMATOCRIT: 41.8 % (ref 34.8–46.6)
HGB: 13.7 g/dL (ref 11.6–15.9)
LYMPH#: 0.7 10*3/uL — AB (ref 0.9–3.3)
LYMPH%: 16.7 % (ref 14.0–48.0)
MCH: 27.9 pg (ref 26.0–34.0)
MCHC: 32.8 g/dL (ref 32.0–36.0)
MCV: 85 fL (ref 81–101)
MONO#: 0.4 10*3/uL (ref 0.1–0.9)
MONO%: 9.5 % (ref 0.0–13.0)
NEUT#: 3.1 10*3/uL (ref 1.5–6.5)
NEUT%: 70.8 % (ref 39.6–80.0)
PLATELETS: 117 10*3/uL — AB (ref 145–400)
RBC: 4.91 10*6/uL (ref 3.70–5.32)
RDW: 18.6 % — AB (ref 11.1–15.7)
WBC: 4.4 10*3/uL (ref 3.9–10.0)

## 2015-01-25 LAB — CMP (CANCER CENTER ONLY)
ALBUMIN: 3.2 g/dL — AB (ref 3.3–5.5)
ALK PHOS: 190 U/L — AB (ref 26–84)
ALT(SGPT): 35 U/L (ref 10–47)
AST: 46 U/L — ABNORMAL HIGH (ref 11–38)
BUN: 8 mg/dL (ref 7–22)
CO2: 27 mEq/L (ref 18–33)
Calcium: 8.9 mg/dL (ref 8.0–10.3)
Chloride: 102 mEq/L (ref 98–108)
Creat: 0.7 mg/dl (ref 0.6–1.2)
GLUCOSE: 140 mg/dL — AB (ref 73–118)
POTASSIUM: 3.6 meq/L (ref 3.3–4.7)
Sodium: 143 mEq/L (ref 128–145)
Total Bilirubin: 0.7 mg/dl (ref 0.20–1.60)
Total Protein: 8.6 g/dL — ABNORMAL HIGH (ref 6.4–8.1)

## 2015-01-25 LAB — CANCER ANTIGEN 27.29: CA 27.29: 23 U/mL (ref 0–39)

## 2015-01-25 MED ORDER — OSELTAMIVIR PHOSPHATE 75 MG PO CAPS: 75.0000 mg | ORAL_CAPSULE | Freq: Every day | ORAL | Status: DC

## 2015-01-25 MED ORDER — SODIUM CHLORIDE 0.9 % IV SOLN
1000.0000 mL | Freq: Once | INTRAVENOUS | Status: AC
  Administered 2015-01-25: 1000 mL via INTRAVENOUS

## 2015-01-25 NOTE — Telephone Encounter (Addendum)
Patient aware. Patient also reports a continuous fever, sometimes with chills. Some appetite, however poor po intake. Denies nausea/vomiting. Will bring patient in today for assessment.    ----- Message from [name], MD sent at 01/22/2015  6:07 PM EDT ----- Call - tell her that there is no obvious brain mets!!!  pete

## 2015-01-25 NOTE — Patient Instructions (Signed)
Dehydration, Adult Dehydration is when you lose more fluids from the body than you take in. Vital organs like the kidneys, brain, and heart cannot function without a proper amount of fluids and salt. Any loss of fluids from the body can cause dehydration.  CAUSES   Vomiting.  Diarrhea.  Excessive sweating.  Excessive urine output.  Fever. SYMPTOMS  Mild dehydration  Thirst.  Dry lips.  Slightly dry mouth. Moderate dehydration  Very dry mouth.  Sunken eyes.  Skin does not bounce back quickly when lightly pinched and released.  Dark urine and decreased urine production.  Decreased tear production.  Headache. Severe dehydration  Very dry mouth.  Extreme thirst.  Rapid, weak pulse (more than 100 beats per minute at rest).  Cold hands and feet.  Not able to sweat in spite of heat and temperature.  Rapid breathing.  Blue lips.  Confusion and lethargy.  Difficulty being awakened.  Minimal urine production.  No tears. DIAGNOSIS  Your caregiver will diagnose dehydration based on your symptoms and your exam. Blood and urine tests will help confirm the diagnosis. The diagnostic evaluation should also identify the cause of dehydration. TREATMENT  Treatment of mild or moderate dehydration can often be done at home by increasing the amount of fluids that you drink. It is best to drink small amounts of fluid more often. Drinking too much at one time can make vomiting worse. Refer to the home care instructions below. Severe dehydration needs to be treated at the hospital where you will probably be given intravenous (IV) fluids that contain water and electrolytes. HOME CARE INSTRUCTIONS   Ask your caregiver about specific rehydration instructions.  Drink enough fluids to keep your urine clear or pale yellow.  Drink small amounts frequently if you have nausea and vomiting.  Eat as you normally do.  Avoid:  Foods or drinks high in sugar.  Carbonated  drinks.  Juice.  Extremely hot or cold fluids.  Drinks with caffeine.  Fatty, greasy foods.  Alcohol.  Tobacco.  Overeating.  Gelatin desserts.  Wash your hands well to avoid spreading bacteria and viruses.  Only take over-the-counter or prescription medicines for pain, discomfort, or fever as directed by your caregiver.  Ask your caregiver if you should continue all prescribed and over-the-counter medicines.  Keep all follow-up appointments with your caregiver. SEEK MEDICAL CARE IF:  You have abdominal pain and it increases or stays in one area (localizes).  You have a rash, stiff neck, or severe headache.  You are irritable, sleepy, or difficult to awaken.  You are weak, dizzy, or extremely thirsty. SEEK IMMEDIATE MEDICAL CARE IF:   You are unable to keep fluids down or you get worse despite treatment.  You have frequent episodes of vomiting or diarrhea.  You have blood or green matter (bile) in your vomit.  You have blood in your stool or your stool looks black and tarry.  You have not urinated in 6 to 8 hours, or you have only urinated a small amount of very dark urine.  You have a fever.  You faint. MAKE SURE YOU:   Understand these instructions.  Will watch your condition.  Will get help right away if you are not doing well or get worse. Document Released: 10/09/2005 Document Revised: 01/01/2012 Document Reviewed: 05/29/2011 ExitCare Patient Information 2015 [employer]. This information is not intended to replace advice given to you by your health care provider. Make sure you discuss any questions you have with your health care   provider.  

## 2015-01-25 NOTE — Progress Notes (Signed)
Hematology and Oncology Follow Up Visit  LAFERN BRINKLEY [id number] 1966/08/07 49 y.o. 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 Mcnear is here today with c/o ;ethargy, low grade fever (100.5 at the highest), dizziness and mild nausea with no vomiting. She is feeling achy in her joints.Her temp today is 98.2. Her WBC count is 4.4. Her tongue is still sore despite using the Dukes mouthwash and lidocaine gel.  She denies cough, rash, headache, chest pain, palpitations, abdominal pain, constipation, diarrhea, blood in urine or stool. She still has SOB with exertion at times.  No swelling, numbness or tingling in her extremities.  Her appetite is down because of the nausea but she is trying to stay hydrated. Her weight is stable at 162 lbs.   Medications:    Medication List       This list is accurate as of: 01/25/15  1:59 PM.  Always use your most recent med list.               albuterol 108 (90 BASE) MCG/ACT inhaler  Commonly known as:  PROVENTIL HFA;VENTOLIN HFA  Inhale 2 puffs into the lungs every 4 (four) hours as needed for wheezing or shortness of breath.     ALPRAZolam 1 MG tablet  Commonly known as:  XANAX  Take 1 tablet (1 mg total) by mouth at bedtime as needed for anxiety.     aspirin EC 81 MG EC tablet  Generic drug:  aspirin  Take 81 mg by mouth daily. Swallow whole.     calcium carbonate 500 MG chewable tablet  Commonly known as:  TUMS - dosed in mg elemental calcium  Chew 2 tablets by mouth 2 (two) times daily.     diphenhydrAMINE 25 MG tablet  Commonly known as:  SOMINEX  Take 100 mg by mouth at bedtime as needed for sleep. Pt states she is taking up to 200 mg Benadryl at  night and still not sleeping     dronabinol 10 MG capsule  Commonly known as:  MARINOL  Take 1 capsule (10 mg total) by mouth 3 (three) times daily before meals.     ergocalciferol 50000 UNITS capsule  Commonly known as:  VITAMIN D2  Take 1 capsule (50,000 Units total) by mouth once a week.     fentaNYL 50 MCG/HR  Commonly known as:  DURAGESIC  Place 1 patch (50 mcg total) onto the skin every 3 (three) days.     HYDROmorphone 4 MG tablet  Commonly known as:  DILAUDID  Take 1/2 - 1 pill, IF NEEDED, for pain every 4 hrs     Lactulose Soln  Take 2 TBLSP every 6 hours until bowel movement     lidocaine 2 % solution  Commonly known as:  XYLOCAINE  Use as directed 20 mLs in the mouth or throat as needed for mouth pain.     lidocaine-prilocaine cream  Commonly known as:  EMLA  Apply 1 application topically as needed. Placeonto port site 92min prior to treatment and cover with plastic wrap.     loratadine-pseudoephedrine 10-240 MG per 24 hr tablet  Commonly known as:  CLARITIN-D 24-hour  Take 1 tablet by mouth  daily.     LORazepam 1 MG tablet  Commonly known as:  ATIVAN  Place 1 tablet under the tongue, IF NEEDED, every 6 hours for nausea.     methylphenidate 10 MG tablet  Commonly known as:  RITALIN  Take 1 tablet (10 mg total) by mouth 2 (two) times daily.     metoCLOPramide 10 MG tablet  Commonly known as:  REGLAN  Take 1 tablet (10 mg total) by mouth every 6 (six) hours as needed for nausea.     multivitamin tablet  Take 1 tablet by mouth daily.     naproxen sodium 220 MG tablet  Commonly known as:  ANAPROX  Take 220 mg by mouth 2 (two) times daily with a meal.     NONFORMULARY OR COMPOUNDED ITEM  Take by mouth as needed. DUKES MOUTHWASH  SWISH AND SWALLOW     OLANZapine 5 MG tablet  Commonly known as:  ZYPREXA  Take 1 tablet (5 mg total) by mouth at bedtime.     ondansetron 8 MG tablet  Commonly known as:  ZOFRAN  TAKE 1 TABLET BY MOUTH EVERY 8 HOURS AS  NEEDED FOR NAUSEA OR VOMITING     oseltamivir 75 MG capsule  Commonly known as:  TAMIFLU  Take 1 capsule (75 mg total) by mouth daily.     pantoprazole 40 MG tablet  Commonly known as:  PROTONIX  Take 1 tablet (40 mg total) by mouth daily.     PEPTO-BISMOL PO  Take by mouth as needed.     prochlorperazine 10 MG tablet  Commonly known as:  COMPAZINE  Take 1 tablet (10 mg total) by mouth every 6 (six) hours as needed for nausea or vomiting.     promethazine 25 MG suppository  Commonly known as:  PHENERGAN  Place 1 suppository (25 mg total) rectally every 6 (six) hours as needed for nausea or vomiting.     traZODone 100 MG tablet  Commonly known as:  DESYREL  Take 1-2 if needed at bed for sleep.        Allergies:  Allergies  Allergen Reactions  . Iohexol      Code: RASH, Desc: VERY STRONG FAMILY HX OF ANGIOEDEMA WHEN RECEIVING IV CONTRAST; PT HAS BEEN PREMEDICATED FOR OTHER CONTRASTED STUDIES(IN CATH. LAB)  KR, Onset Date: [id number]   . Prednisone Itching    Capillary beds bust  . Tetanus Toxoids Other (See Comments)    Ran a high fever for 48 hours  . Theophyllines Hives    Mental changes  . Versed [Midazolam] Other (See Comments)    Pt becomes violent    Past Medical History, Surgical history, Social history, and Family History were reviewed and updated.  Review of Systems: All other 10 point review of systems is negative.   Physical Exam:  oral temperature is 98.2 F (36.8 C). Her blood pressure is 118/68 and her pulse is 88.   Wt Readings from Last 3 Encounters:  01/07/15 162 lb (73.483 kg)  12/17/14 164 lb (74.39 kg)  12/11/14 165 lb (74.844 kg)    Ocular: Sclerae unicteric, pupils equal, round and reactive to light Ear-nose-throat: Oropharynx clear, dentition fair Lymphatic: No cervical or supraclavicular adenopathy Lungs no rales or rhonchi, good excursion bilaterally Heart regular rate and rhythm, no murmur appreciated Abd soft, nontender, positive  bowel sounds MSK no focal spinal tenderness, no joint edema Neuro: non-focal, well-oriented, appropriate affect Breasts: No changes. No mass, lesions, rash. She has some right axillary  lymphadenopathy.   Lab Results  Component Value Date   WBC 4.4 01/25/2015   HGB 13.7 01/25/2015   HCT 41.8 01/25/2015   MCV 85 01/25/2015   PLT 117* 01/25/2015   No results found for: FERRITIN, IRON, TIBC, UIBC, IRONPCTSAT Lab Results  Component Value Date   RBC 4.91 01/25/2015   No results found for: KPAFRELGTCHN, LAMBDASER, KAPLAMBRATIO No results found for: Kandis Cocking, IGMSERUM No results found for: Odetta Pink, SPEI   Chemistry      Component Value Date/Time   NA 143 01/25/2015 1311   NA 136 03/24/2014 1147   K 3.6 01/25/2015 1311   K 4.2 03/24/2014 1147   CL 102 01/25/2015 1311   CL 105 03/24/2014 1147   CO2 27 01/25/2015 1311   CO2 21 03/24/2014 1147   BUN 8 01/25/2015 1311   BUN 15 03/24/2014 1147   CREATININE 0.7 01/25/2015 1311   CREATININE 0.83 03/24/2014 1147      Component Value Date/Time   CALCIUM 8.9 01/25/2015 1311   CALCIUM 8.8 03/24/2014 1147   ALKPHOS 190* 01/25/2015 1311   ALKPHOS 106 03/24/2014 1147   AST 46* 01/25/2015 1311   AST 19 03/24/2014 1147   ALT 35 01/25/2015 1311   ALT 28 03/24/2014 1147   BILITOT 0.70 01/25/2015 1311   BILITOT 0.5 03/24/2014 1147     Impression and Plan: Ms. Cavan is 49 year old white female with triple positive metastatic breast cancer. She is now on Kadcyla.  She is here today with c/o fever, nauseam fatigue and achy joints. She is not febrile at this time.  We will start her on a prophylactic dose of Tamilflu 75 mg daily for 10 days.  We will also hold off on treating her this week and resume next week if she has improved.  We will also give her 1 liter of fluids today.  Her CMP is actually improved. Her WBC count is 4.4.  I will call and check on her later this week  and see how she is improving.  She knows to call here with any questions or concerns. We can certainly see her sooner if need be.   Eliezer Bottom, NP 4/4/20161:59 PM

## 2015-01-28 ENCOUNTER — Ambulatory Visit: Payer: 59 | Admitting: Family

## 2015-01-28 ENCOUNTER — Other Ambulatory Visit: Payer: 59

## 2015-01-28 ENCOUNTER — Ambulatory Visit: Payer: 59

## 2015-01-29 ENCOUNTER — Ambulatory Visit
Admission: RE | Admit: 2015-01-29 | Discharge: 2015-01-29 | Disposition: A | Discharge status: Home / Self Care | Payer: 59 | Source: Ambulatory Visit | Attending: Radiation Oncology | Admitting: Radiation Oncology

## 2015-01-29 ENCOUNTER — Encounter: Payer: Self-pay | Admitting: Radiation Oncology

## 2015-01-29 VITALS — BP 121/73 | HR 86 | Temp 98.2°F | Resp 20 | Ht 65.0 in | Wt 163.6 lb

## 2015-01-29 DIAGNOSIS — C7931 Secondary malignant neoplasm of brain: Secondary | ICD-10-CM

## 2015-01-29 NOTE — Progress Notes (Signed)
Follow up s/p SRS Brain mets left temporal 12/16/14, had IVF"S Monday at Dr. Vernona Rieger office, takes, diladudi, has 54mcg duragesic patch, takes marinol 3x day nausea, no head aches stated, no blurred vision, does have swelling under right axila again and numbness in right hand, fatigued, appetite slowly getting better, using Dukes mouthwash and viscous lidocaine, top of tongue burns when eating, last chemotherapy on hold last week, next week scheduled to resume ,usually every 3 weeks, Zometa, Progeta,& Kadcyla , bowels good, bladder no problem 3:35 PM

## 2015-01-29 NOTE — Progress Notes (Signed)
Radiation Oncology         [phone] ________________________________  Name: Bianca Kent MRN: [id number]  Date: 01/29/2015  DOB: 1966/01/12  Follow-Up Visit Note  Outpatient  CC: DAUB, Lina Sayre, MD  [name], MD  Diagnosis and Prior Radiotherapy:    ICD-9-CM ICD-10-CM   1. Brain metastasis 198.3 C79.31      Brain metastasis, Grade III triple positive breast cancer   Indication for treatment:  palliative       Radiation treatment dates:   12/16/2014  Site/dose:   Left temporal 37mm tumor / 20 Gy in 1 fraction  Narrative:  The patient returns today for routine follow-up.  Recent brain MRI for right hand tingling showed good response in left temporal lesion and no new brain mets.  She has not other new neurologic complaints.                              ALLERGIES:  is allergic to iohexol; prednisone; tetanus toxoids; theophyllines; and versed.  Meds: Current Outpatient Prescriptions  Medication Sig Dispense Refill  . albuterol (PROVENTIL HFA;VENTOLIN HFA) 108 (90 BASE) MCG/ACT inhaler Inhale 2 puffs into the lungs every 4 (four) hours as needed for wheezing or shortness of breath. 2 Inhaler 6  . ALPRAZolam (XANAX) 1 MG tablet Take 1 tablet (1 mg total) by mouth at bedtime as needed for anxiety. 30 tablet 2  . aspirin (ASPIRIN EC) 81 MG EC tablet Take 81 mg by mouth daily. Swallow whole.    . Bismuth Subsalicylate (PEPTO-BISMOL PO) Take by mouth as needed.    . calcium carbonate (TUMS - DOSED IN MG ELEMENTAL CALCIUM) 500 MG chewable tablet Chew 2 tablets by mouth 2 (two) times daily.    . diphenhydrAMINE (SOMINEX) 25 MG tablet Take 100 mg by mouth at bedtime as needed for sleep. Pt states she is taking up to 200 mg Benadryl at night and still not sleeping    . dronabinol (MARINOL) 10 MG capsule Take 1 capsule (10 mg total) by mouth 3 (three) times daily before meals. 90 capsule 0  . ergocalciferol (VITAMIN D2) 50000 UNITS capsule Take 1 capsule (50,000 Units total) by  mouth once a week. 4 capsule 6  . fentaNYL (DURAGESIC) 50 MCG/HR Place 1 patch (50 mcg total) onto the skin every 3 (three) days. 10 patch 0  . HYDROmorphone (DILAUDID) 4 MG tablet Take 1/2 - 1 pill, IF NEEDED, for pain every 4 hrs 90 tablet 0  . Lactulose SOLN Take 2 TBLSP every 6 hours until bowel movement 1000 mL 4  . lidocaine (XYLOCAINE) 2 % solution Use as directed 20 mLs in the mouth or throat as needed for mouth pain. 200 mL 2  . lidocaine-prilocaine (EMLA) cream Apply 1 application topically as needed. Placeonto port site 41min prior to treatment and cover with plastic wrap. 30 g 2  . loratadine-pseudoephedrine (CLARITIN-D 24-HOUR) 10-240 MG per 24 hr tablet Take 1 tablet by mouth daily.    Marland Kitchen LORazepam (ATIVAN) 1 MG tablet Place 1 tablet under the tongue, IF NEEDED, every 6 hours for nausea. 60 tablet 2  . methylphenidate (RITALIN) 10 MG tablet Take 1 tablet (10 mg total) by mouth 2 (two) times daily. 60 tablet 0  . metoCLOPramide (REGLAN) 10 MG tablet Take 1 tablet (10 mg total) by mouth every 6 (six) hours as needed for nausea. 120 tablet 5  . Multiple Vitamin (MULTIVITAMIN) tablet Take  1 tablet by mouth daily.    . naproxen sodium (ANAPROX) 220 MG tablet Take 220 mg by mouth 2 (two) times daily with a meal.    . NONFORMULARY OR COMPOUNDED ITEM Take by mouth as needed. DUKES MOUTHWASH  SWISH AND SWALLOW    . OLANZapine (ZYPREXA) 5 MG tablet Take 1 tablet (5 mg total) by mouth at bedtime. 30 tablet 3  . ondansetron (ZOFRAN) 8 MG tablet TAKE 1 TABLET BY MOUTH EVERY 8 HOURS AS NEEDED FOR NAUSEA OR VOMITING 20 tablet 2  . oseltamivir (TAMIFLU) 75 MG capsule Take 1 capsule (75 mg total) by mouth daily. 10 capsule 0  . pantoprazole (PROTONIX) 40 MG tablet Take 1 tablet (40 mg total) by mouth daily. 30 tablet 6  . prochlorperazine (COMPAZINE) 10 MG tablet Take 1 tablet (10 mg total) by mouth every 6 (six) hours as needed for nausea or vomiting. 60 tablet 2  . promethazine (PHENERGAN) 25 MG  suppository Place 1 suppository (25 mg total) rectally every 6 (six) hours as needed for nausea or vomiting. 30 each 3  . traZODone (DESYREL) 100 MG tablet Take 1-2 if needed at bed for sleep. 30 tablet 2   No current facility-administered medications for this encounter.    Physical Findings: The patient is in no acute distress. Patient is alert and oriented.  height is 5\' 5"  (1.651 m) and weight is 163 lb 9.6 oz (74.208 kg). Her oral temperature is 98.2 F (36.8 C). Her blood pressure is 121/73 and her pulse is 86. Her respiration is 20 and oxygen saturation is 95%. .    Neurologic exam is nonfocal and without deficits   Lab Findings: Lab Results  Component Value Date   WBC 4.4 01/25/2015   HGB 13.7 01/25/2015   HCT 41.8 01/25/2015   MCV 85 01/25/2015   PLT 117* 01/25/2015    Radiographic Findings: Mr [name] [facility] Contrast  01/20/2015   CLINICAL DATA:  Breast cancer with known brain metastases. New LEFT-sided numbness affecting the LEFT upper extremity in fingers. Initial encounter.  EXAM: MRI HEAD WITHOUT AND WITH CONTRAST  TECHNIQUE: Multiplanar, multiecho pulse sequences of the brain and surrounding structures were obtained without and with intravenous contrast.  CONTRAST:  45mL MULTIHANCE GADOBENATE DIMEGLUMINE 529 MG/ML IV SOLN  COMPARISON:  Most recent brain MR 12/11/2014. Initial brain MR 11/26/2014.  FINDINGS: No evidence for acute infarction, hemorrhage, hydrocephalus or extra-axial fluid. Normal for age cerebral volume. No white matter disease. Flow voids are maintained. No chronic hemorrhage. No midline abnormalities.  Post infusion, redemonstrated is a LEFT posterior temporal metastasis to the gray-white junction as seen on image 63 series 11. The metastasis is smaller than previously noted, now 2 mm in diameter. No new lesions are seen.  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 RIGHT LEFT dimension of 18 mm, oblique  craniocaudad dimension of 22 mm, and anterior posterior maximal dimension of 48 mm. This extends into the LEFT orbit and displaces the LEFT orbital extra-ocular musculature medially. No diffusion restriction. The LEFT optic nerve is not compressed. Overall size is stable.  Tiny focus of enhancement question on the original brain MR 11/26/2014 (image 59 series 14) has not shown progression and was probably a vessel.  IMPRESSION: No acute abnormality is seen to explain the patient's LEFT upper extremity symptoms. Specifically, there is no evidence for acute infarction, new metastatic disease, or extra-axial fluid.  LEFT temporal metastasis, now decreased in both size  and intensity of enhancement, 2 mm diameter when compared with previous most recent MR 12/11/2014.  Stable, but worrisome due to regional mass effect, LEFT frontoethmoid mucocele. ENT consultation is warranted.   Electronically Signed   By: Rolla Flatten M.D.   On: 01/20/2015 20:02    Impression/Plan: doing well from a CNS standpoint. F/u in 3 mo with repeat brain MRI, sooner if needed   _____________________________________   Eppie Gibson, MD

## 2015-02-04 ENCOUNTER — Other Ambulatory Visit: Payer: 59

## 2015-02-04 ENCOUNTER — Ambulatory Visit: Payer: 59

## 2015-02-04 ENCOUNTER — Ambulatory Visit: Payer: 59 | Admitting: Family

## 2015-02-09 ENCOUNTER — Other Ambulatory Visit: Payer: Self-pay | Admitting: Radiation Therapy

## 2015-02-09 DIAGNOSIS — C7931 Secondary malignant neoplasm of brain: Secondary | ICD-10-CM

## 2015-02-12 ENCOUNTER — Ambulatory Visit (HOSPITAL_BASED_OUTPATIENT_CLINIC_OR_DEPARTMENT_OTHER): Payer: 59

## 2015-02-12 ENCOUNTER — Ambulatory Visit (HOSPITAL_BASED_OUTPATIENT_CLINIC_OR_DEPARTMENT_OTHER): Payer: 59 | Admitting: Family

## 2015-02-12 ENCOUNTER — Other Ambulatory Visit (HOSPITAL_BASED_OUTPATIENT_CLINIC_OR_DEPARTMENT_OTHER): Payer: 59

## 2015-02-12 ENCOUNTER — Other Ambulatory Visit: Payer: Self-pay | Admitting: *Deleted

## 2015-02-12 ENCOUNTER — Encounter: Payer: Self-pay | Admitting: Family

## 2015-02-12 VITALS — BP 128/82 | HR 85 | Temp 97.7°F | Resp 14 | Ht 65.0 in | Wt 164.0 lb

## 2015-02-12 DIAGNOSIS — C50912 Malignant neoplasm of unspecified site of left female breast: Secondary | ICD-10-CM

## 2015-02-12 DIAGNOSIS — C7931 Secondary malignant neoplasm of brain: Secondary | ICD-10-CM

## 2015-02-12 DIAGNOSIS — C50911 Malignant neoplasm of unspecified site of right female breast: Secondary | ICD-10-CM | POA: Diagnosis not present

## 2015-02-12 DIAGNOSIS — Z170 Estrogen receptor positive status [ER+]: Secondary | ICD-10-CM | POA: Diagnosis not present

## 2015-02-12 DIAGNOSIS — M2560 Stiffness of unspecified joint, not elsewhere classified: Secondary | ICD-10-CM

## 2015-02-12 DIAGNOSIS — R29898 Other symptoms and signs involving the musculoskeletal system: Principal | ICD-10-CM

## 2015-02-12 LAB — CMP (CANCER CENTER ONLY)
ALBUMIN: 3.4 g/dL (ref 3.3–5.5)
ALT(SGPT): 48 U/L — ABNORMAL HIGH (ref 10–47)
AST: 52 U/L — ABNORMAL HIGH (ref 11–38)
Alkaline Phosphatase: 188 U/L — ABNORMAL HIGH (ref 26–84)
BUN: 8 mg/dL (ref 7–22)
CALCIUM: 9.2 mg/dL (ref 8.0–10.3)
CHLORIDE: 107 meq/L (ref 98–108)
CO2: 30 meq/L (ref 18–33)
Creat: 0.7 mg/dl (ref 0.6–1.2)
GLUCOSE: 109 mg/dL (ref 73–118)
POTASSIUM: 3.5 meq/L (ref 3.3–4.7)
SODIUM: 144 meq/L (ref 128–145)
Total Bilirubin: 0.8 mg/dl (ref 0.20–1.60)
Total Protein: 8.4 g/dL — ABNORMAL HIGH (ref 6.4–8.1)

## 2015-02-12 LAB — CBC WITH DIFFERENTIAL (CANCER CENTER ONLY)
BASO#: 0 10*3/uL (ref 0.0–0.2)
BASO%: 0.4 % (ref 0.0–2.0)
EOS ABS: 0.1 10*3/uL (ref 0.0–0.5)
EOS%: 2.6 % (ref 0.0–7.0)
HEMATOCRIT: 41.7 % (ref 34.8–46.6)
HEMOGLOBIN: 13.8 g/dL (ref 11.6–15.9)
LYMPH#: 0.8 10*3/uL — ABNORMAL LOW (ref 0.9–3.3)
LYMPH%: 16.5 % (ref 14.0–48.0)
MCH: 28.3 pg (ref 26.0–34.0)
MCHC: 33.1 g/dL (ref 32.0–36.0)
MCV: 86 fL (ref 81–101)
MONO#: 0.3 10*3/uL (ref 0.1–0.9)
MONO%: 5 % (ref 0.0–13.0)
NEUT#: 3.8 10*3/uL (ref 1.5–6.5)
NEUT%: 75.5 % (ref 39.6–80.0)
Platelets: 137 10*3/uL — ABNORMAL LOW (ref 145–400)
RBC: 4.88 10*6/uL (ref 3.70–5.32)
RDW: 19 % — ABNORMAL HIGH (ref 11.1–15.7)
WBC: 5 10*3/uL (ref 3.9–10.0)

## 2015-02-12 MED ORDER — SODIUM CHLORIDE 0.9 % IJ SOLN
10.0000 mL | INTRAMUSCULAR | Status: DC | PRN
  Administered 2015-02-12: 10 mL
  Filled 2015-02-12: qty 10

## 2015-02-12 MED ORDER — HEPARIN SOD (PORK) LOCK FLUSH 100 UNIT/ML IV SOLN
500.0000 [IU] | Freq: Once | INTRAVENOUS | Status: AC
  Administered 2015-02-12: 500 [IU] via INTRAVENOUS
  Filled 2015-02-12: qty 5

## 2015-02-12 MED ORDER — PYRIDOXINE HCL 250 MG PO TABS: 250.0000 mg | ORAL_TABLET | Freq: Every day | ORAL | Status: DC

## 2015-02-12 MED ORDER — SODIUM CHLORIDE 0.9 % IJ SOLN
10.0000 mL | INTRAMUSCULAR | Status: DC | PRN
  Administered 2015-02-12: 10 mL via INTRAVENOUS
  Filled 2015-02-12: qty 10

## 2015-02-12 NOTE — Patient Instructions (Signed)

## 2015-02-12 NOTE — Progress Notes (Signed)
Hematology and Oncology Follow Up Visit  MCKINZEE SPIRITO [id number] August 21, 1966 49 y.o. 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 Ohm is here today with her sister for follow-up and cycle 9 of treatment. They had a wonderful time at [employer] last week but she is worn out. 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 It is causing her to have trouble holding on to things. She is not on B 6 or Neurontin. She is also having pain in her right shoulder and is unable to lift her arm above breast level.  She denies cough, rash, headache, chest pain, palpitations, abdominal pain, constipation, diarrhea, blood in urine or stool. She still has some SOB with exertion. She feels like her arms are holding fluid in them. She has no edema. We will consult the lymphedema clinic for her for lymphatic massage.  Her appetite is improving and she is staying hydrated. Her weight is stable.    Medications:    Medication List       This list is accurate as of: 02/12/15 11:14 AM.  Always use your most recent med list.               albuterol 108 (90 BASE) MCG/ACT inhaler  Commonly known as:  PROVENTIL HFA;VENTOLIN HFA  Inhale 2 puffs into the lungs every 4 (four) hours as needed for wheezing or shortness of breath.     ALPRAZolam 1 MG tablet  Commonly known as:  XANAX  Take 1 tablet (1 mg total) by mouth at bedtime as needed for anxiety.     aspirin EC 81 MG EC tablet  Generic drug:  aspirin  Take 81 mg by mouth daily. Swallow whole.     calcium carbonate 500 MG chewable tablet  Commonly known as:  TUMS - dosed in mg elemental calcium  Chew 2 tablets by mouth 2 (two) times daily.     diphenhydrAMINE 25 MG  tablet  Commonly known as:  SOMINEX  Take 100 mg by mouth at bedtime as needed for sleep. Pt states she is taking up to 200 mg Benadryl at night and still not sleeping     dronabinol 10 MG capsule  Commonly known as:  MARINOL  Take 1 capsule (10 mg total) by mouth 3 (three) times daily before meals.     ergocalciferol 50000 UNITS capsule  Commonly known as:  VITAMIN D2  Take 1 capsule (50,000 Units total) by mouth once a week.     fentaNYL 50 MCG/HR  Commonly known as:  DURAGESIC  Place 1 patch (50 mcg total) onto the skin every 3 (three) days.     HYDROmorphone 4 MG tablet  Commonly known as:  DILAUDID  Take 1/2 - 1 pill, IF NEEDED, for pain every 4 hrs     Lactulose Soln  Take 2 TBLSP every 6 hours until bowel movement     lidocaine 2 % solution  Commonly known as:  XYLOCAINE  Use as directed 20 mLs in the mouth or throat as needed for mouth pain.     lidocaine-prilocaine cream  Commonly known as:  EMLA  Apply 1 application topically as needed. Placeonto port site 61min prior to  treatment and cover with plastic wrap.     loratadine-pseudoephedrine 10-240 MG per 24 hr tablet  Commonly known as:  CLARITIN-D 24-hour  Take 1 tablet by mouth daily.     LORazepam 1 MG tablet  Commonly known as:  ATIVAN  Place 1 tablet under the tongue, IF NEEDED, every 6 hours for nausea.     methylphenidate 10 MG tablet  Commonly known as:  RITALIN  Take 1 tablet (10 mg total) by mouth 2 (two) times daily.     metoCLOPramide 10 MG tablet  Commonly known as:  REGLAN  Take 1 tablet (10 mg total) by mouth every 6 (six) hours as needed for nausea.     multivitamin tablet  Take 1 tablet by mouth daily.     naproxen sodium 220 MG tablet  Commonly known as:  ANAPROX  Take 220 mg by mouth 2 (two) times daily with a meal.     NONFORMULARY OR COMPOUNDED ITEM  Take by mouth as needed. DUKES MOUTHWASH  SWISH AND SWALLOW     OLANZapine 5 MG tablet  Commonly known as:  ZYPREXA  Take 1 tablet  (5 mg total) by mouth at bedtime.     ondansetron 8 MG tablet  Commonly known as:  ZOFRAN  TAKE 1 TABLET BY MOUTH EVERY 8 HOURS AS NEEDED FOR NAUSEA OR VOMITING     oseltamivir 75 MG capsule  Commonly known as:  TAMIFLU  Take 1 capsule (75 mg total) by mouth daily.     pantoprazole 40 MG tablet  Commonly known as:  PROTONIX  Take 1 tablet (40 mg total) by mouth daily.     PEPTO-BISMOL PO  Take by mouth as needed.     prochlorperazine 10 MG tablet  Commonly known as:  COMPAZINE  Take 1 tablet (10 mg total) by mouth every 6 (six) hours as needed for nausea or vomiting.     promethazine 25 MG suppository  Commonly known as:  PHENERGAN  Place 1 suppository (25 mg total) rectally every 6 (six) hours as needed for nausea or vomiting.     traZODone 100 MG tablet  Commonly known as:  DESYREL  Take 1-2 if needed at bed for sleep.        Allergies:  Allergies  Allergen Reactions  . Iohexol      Code: RASH, Desc: VERY STRONG FAMILY HX OF ANGIOEDEMA WHEN RECEIVING IV CONTRAST; PT HAS BEEN PREMEDICATED FOR OTHER CONTRASTED STUDIES(IN CATH. LAB)  KR, Onset Date: [id number]   . Prednisone Itching    Capillary beds bust  . Tetanus Toxoids Other (See Comments)    Ran a high fever for 48 hours  . Theophyllines Hives    Mental changes  . Versed [Midazolam] Other (See Comments)    Pt becomes violent    Past Medical History, Surgical history, Social history, and Family History were reviewed and updated.  Review of Systems: All other 10 point review of systems is negative.   Physical Exam:  vitals were not taken for this visit.  Wt Readings from Last 3 Encounters:  01/07/15 162 lb (73.483 kg)  12/17/14 164 lb (74.39 kg)  12/11/14 165 lb (74.844 kg)    Ocular: Sclerae unicteric, pupils equal, round and reactive to light Ear-nose-throat: Oropharynx clear, dentition fair Lymphatic: No cervical or supraclavicular adenopathy Lungs no rales or rhonchi, good excursion  bilaterally Heart regular rate and rhythm, no murmur appreciated Abd soft, nontender, positive bowel sounds MSK no focal spinal tenderness, no  joint edema Neuro: non-focal, well-oriented, appropriate affect Breasts: No changes.No mass, lesions, rash. She has some right axillary lymphadenopathy.   Lab Results  Component Value Date   WBC 4.4 01/25/2015   HGB 13.7 01/25/2015   HCT 41.8 01/25/2015   MCV 85 01/25/2015   PLT 117* 01/25/2015   No results found for: FERRITIN, IRON, TIBC, UIBC, IRONPCTSAT Lab Results  Component Value Date   RBC 4.91 01/25/2015   No results found for: KPAFRELGTCHN, LAMBDASER, KAPLAMBRATIO No results found for: Kandis Cocking, IGMSERUM No results found for: Odetta Pink, SPEI   Chemistry      Component Value Date/Time   NA 143 01/25/2015 1311   NA 136 03/24/2014 1147   K 3.6 01/25/2015 1311   K 4.2 03/24/2014 1147   CL 102 01/25/2015 1311   CL 105 03/24/2014 1147   CO2 27 01/25/2015 1311   CO2 21 03/24/2014 1147   BUN 8 01/25/2015 1311   BUN 15 03/24/2014 1147   CREATININE 0.7 01/25/2015 1311   CREATININE 0.83 03/24/2014 1147      Component Value Date/Time   CALCIUM 8.9 01/25/2015 1311   CALCIUM 8.8 03/24/2014 1147   ALKPHOS 190* 01/25/2015 1311   ALKPHOS 106 03/24/2014 1147   AST 46* 01/25/2015 1311   AST 19 03/24/2014 1147   ALT 35 01/25/2015 1311   ALT 28 03/24/2014 1147   BILITOT 0.70 01/25/2015 1311   BILITOT 0.5 03/24/2014 1147     Impression and Plan: Ms. Dziuba is 49 year old white female with triple positive metastatic breast cancer. She is feeling fatigued and having worsening neuropathy in her hands that is now affecting her dexterity. She is also having pain in her right shoulder and cannot lift her arm past breast level. We will get an MRI of the shoulder next week.  We will hold treatment today and give her a 2 week break. When we resume in 2 weeks we will decrease her  Kadcyla by 20%.  We will also have her start taking vitamin B 6 250 mg daily.  She knows to call here with any questions or concerns. We can certainly see her sooner if need be.   Eliezer Bottom, NP 4/22/201611:14 AM

## 2015-02-12 NOTE — Progress Notes (Signed)
12:37 PM Will hold chemo today, to restart in 2 weeks.

## 2015-02-18 ENCOUNTER — Ambulatory Visit: Payer: 59

## 2015-02-18 ENCOUNTER — Other Ambulatory Visit: Payer: 59

## 2015-02-18 ENCOUNTER — Ambulatory Visit: Payer: 59 | Admitting: Hematology & Oncology

## 2015-02-18 ENCOUNTER — Telehealth: Payer: Self-pay | Admitting: Hematology & Oncology

## 2015-02-18 NOTE — Telephone Encounter (Signed)
returned call and sched pt for flush for port acces for MRI and GI.Marland KitchenMarland Kitchenper Thayer Headings ok

## 2015-02-22 ENCOUNTER — Ambulatory Visit (HOSPITAL_COMMUNITY)
Admission: RE | Admit: 2015-02-22 | Discharge: 2015-02-22 | Disposition: A | Discharge status: Home / Self Care | Payer: 59 | Source: Ambulatory Visit | Attending: Family | Admitting: Family

## 2015-02-22 DIAGNOSIS — R202 Paresthesia of skin: Secondary | ICD-10-CM | POA: Insufficient documentation

## 2015-02-22 DIAGNOSIS — C50912 Malignant neoplasm of unspecified site of left female breast: Secondary | ICD-10-CM | POA: Insufficient documentation

## 2015-02-22 DIAGNOSIS — M25511 Pain in right shoulder: Secondary | ICD-10-CM | POA: Insufficient documentation

## 2015-02-22 DIAGNOSIS — R29898 Other symptoms and signs involving the musculoskeletal system: Secondary | ICD-10-CM

## 2015-02-22 DIAGNOSIS — M2560 Stiffness of unspecified joint, not elsewhere classified: Secondary | ICD-10-CM

## 2015-02-22 DIAGNOSIS — C799 Secondary malignant neoplasm of unspecified site: Secondary | ICD-10-CM | POA: Insufficient documentation

## 2015-02-22 MED ORDER — GADOBENATE DIMEGLUMINE 529 MG/ML IV SOLN
15.0000 mL | Freq: Once | INTRAVENOUS | Status: AC | PRN
  Administered 2015-02-22: 15 mL via INTRAVENOUS

## 2015-02-23 ENCOUNTER — Ambulatory Visit: Payer: 59 | Attending: Hematology & Oncology | Admitting: Physical Therapy

## 2015-02-23 DIAGNOSIS — M25511 Pain in right shoulder: Secondary | ICD-10-CM | POA: Diagnosis not present

## 2015-02-23 DIAGNOSIS — M25611 Stiffness of right shoulder, not elsewhere classified: Secondary | ICD-10-CM

## 2015-02-23 DIAGNOSIS — I890 Lymphedema, not elsewhere classified: Secondary | ICD-10-CM | POA: Diagnosis not present

## 2015-02-23 NOTE — Therapy (Signed)
[place], [place], [address] Phone: [phone]   Fax:  [phone]  Physical Therapy Evaluation  Patient Details  Name: Bianca Kent MRN: [id number] Date of Birth: [birth year] Referring Provider:  [name], MD  Encounter Date: 02/23/2015      PT End of Session - 02/23/15 1212    Visit Number 1   Number of Visits 9   Date for PT Re-Evaluation 03/26/15   PT Start Time 1110   PT Stop Time 1150   PT Time Calculation (min) 40 min   Activity Tolerance Patient limited by pain   Behavior During Therapy [facility] for tasks assessed/performed      Past Medical History  Diagnosis Date  . Allergy   . Arthritis   . Asthma   . Coronary artery spasm   . PONV (postoperative nausea and vomiting)   . Family history of anesthesia complication     Mother and sisters has angioedema  . Sleep apnea     Does not use machine patient stated "its not bad enough for that"  . Pneumonia     hx of  . UTI (lower urinary tract infection)     Due to small ureters  . Hypertension     pt reports that while taking medication for coronary spasms caused elevated blood pressure. Pt no longer takes meds for coronary spasms or HTN.  . Radiation 08/06/14-09/16/14    right breast, axillary, supraclavicular region   . Cancer     right breast & lymph nodes  . Metastasis from breast cancer 11/26/14    MRI Brain  . Breast cancer 02/2014    right  . History of radiation therapy 12/16/14    Left temporal tumor, SRS treatment    Past Surgical History  Procedure Laterality Date  . Cholecystectomy    . Knee surgery      right  . Tendon repair      finger  . Tonsillectomy    . Sinus surgery with instatrak      without instatrak  . Tubal ligation    . Colonoscopy w/ polypectomy  2008  . Wisdom tooth extraction    . Cardiac catheterization  X 3    no PCI  . Portacath placement Left 03/26/2014    Procedure: INSERTION  PORT-A-CATH;  Surgeon: Rolm Bookbinder, MD;  Location: [facility];  Service: General;  Laterality: Left;  . Breast biopsy with sentinel lymph node biopsy and needle localization      There were no vitals filed for this visit.  Visit Diagnosis:  Pain in joint, shoulder region, right - Plan: PT plan of care cert/re-cert  Stiffness of joint, shoulder region, right - Plan: PT plan of care cert/re-cert  Lymphedema - Plan: PT plan of care cert/re-cert      Subjective Assessment - 02/23/15 1114    Subjective diagnosed with Stage IV breast cancer last may. She has alot of pain in ther right upper quarter with numbness and tingling in her hand.  she is having dressing putting a shirt up over her head and undhooking bra behind her back. "everything under here is tendeer"    Pertinent History had chemo and radation, but no surgery. She alos had a tumor in her brain  with gamma knife. Currently chemo every 3 weeks   Weight loss of 60# since November. .  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  She sees Dr. [name] on Thursday of this week.   Patient Stated Goals to get relief from right arm pain   Currently in Pain? Yes   Pain Score 4    Pain Location Shoulder  whole right upper quarter   Pain Orientation Right   Pain Descriptors / Indicators Constant;Aching  really bad pulled muscle    Pain Type Acute pain   Pain Onset More than a month ago   Pain Frequency Constant   Aggravating Factors  going around behind her back, extremely painful taking a shirt off, pain whenever she tries to lift her arm., trouble washing her hair, trouble walking her dog, she says she is extremely right handed so anything she does with right arm is painful, sometimes trouble turning.   Pain Relieving Factors pain medicine, distraction   Effect of Pain on Daily Activities interferes with dressing,  household chores.            [facility] PT Assessment - 02/23/15 0001    Assessment   Medical Diagnosis stage iv breast cancer    Precautions   Precautions Other (comment)  cancer , neuropathy   Restrictions   Weight Bearing Restrictions No   Balance Screen   Has the patient fallen in the past 6 months No   Has the patient had a decrease in activity level because of a fear of falling?  No   Is the patient reluctant to leave their home because of a fear of falling?  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    Skin Integrity skin appears dry.  She has darkened area that appears to be edematous at right axilla at site of previous radiation    Sensation   Additional Comments pt reports pins and needles in hand    Coordination   Gross Motor Movements are Fluid and Coordinated Yes   Posture/Postural Control   Posture/Postural Control Postural limitations   Postural Limitations Rounded Shoulders;Forward head   AROM   Right Shoulder Flexion 140 Degrees   Right Shoulder ABduction 108 Degrees   Right Shoulder Internal Rotation 78 Degrees   Right Shoulder External Rotation 30 Degrees   Cervical - Right Side Bend 35   Cervical - Left Side Bend 22   Strength   Right/Left Shoulder --  measured in supine   Right Shoulder Flexion 3+/5   painful    Right Shoulder Extension --  painful    Right Shoulder ABduction 3/5   Left Shoulder Flexion 4+/5   Left Shoulder ABduction 4+/5   Left Shoulder External Rotation 4+/5   Palpation   Palpation very tender to palpation at  right  upper trap, shoulder, and lateral chest           LYMPHEDEMA/ONCOLOGY QUESTIONNAIRE - 02/23/15 1141    Right Upper Extremity Lymphedema   15 cm Proximal to Olecranon Process 34.4 cm   10 cm Proximal to Olecranon Process 28.5 cm   Olecranon Process 24.5 cm   15 cm Proximal to Ulnar Styloid Process 24.7 cm   10 cm Proximal to Ulnar Styloid Process 23.5 cm   Just Proximal to Ulnar Styloid Process 15.8 cm   Across Hand at [employer] 19.5 cm   At [place] of 2nd Digit 6 cm   Left Upper Extremity Lymphedema   15 cm Proximal to Olecranon Process 31 cm   10 cm Proximal to Olecranon Process 29.4 cm   Olecranon Process 24 cm   15 cm Proximal to Ulnar Styloid Process 24.5 cm   10 cm Proximal to Ulnar Styloid Process 22 cm   Just Proximal to Ulnar Styloid Process 15.5 cm   Across Hand at [employer] 18.5 cm   At [place] of 2nd Digit 5.5 cm           Quick Dash - 02/23/15 0001    Open a tight or new jar Moderate difficulty   Do heavy household chores (wash walls, wash floors) Moderate difficulty   Carry a shopping bag or briefcase Moderate difficulty   Wash your back Unable   Use a knife to cut food Mild difficulty   Recreational activities in which you take some force or impact through your arm, shoulder, or hand (golf, hammering, tennis) Severe difficulty   During the past week, to what extent has your arm, shoulder or hand problem interfered with your normal social activities with family, friends, neighbors, or groups? Slightly   During the past week, to what extent has your arm, shoulder or hand problem limited your work or other regular daily activities Modererately   Arm, shoulder, or hand pain. Moderate   Tingling (pins and needles) in  your arm, shoulder, or hand Severe   Difficulty Sleeping Severe difficulty   DASH Score 56.82 %                        Short Term Clinic Goals - 02/23/15 1231    CC Short Term Goal  #1   Title short term goals=long term goals             Long Term Clinic Goals - 02/23/15 1232    CC Long Term Goal  #1   Title Patient will report a decrease in pain by 50% so they can perform daily activities with greater ease   Time 4   Period Weeks   Status New   CC Long Term Goal  #2   Title Patient will decrease the DASH score to <  45  to demonstrate increased functional use of upper extremity   Baseline 56.82   Time 4   Period Weeks   Status New   CC Long Term Goal  #3   Title Patient will improve shoulder flexion range of motion to 150 degrees to perform activities of daily living and household chores with greater ease   Baseline 140   Time 4   Period Weeks   Status New   CC Long Term Goal  #4   Title Patient will be independent in a basic home program for range of motion and strength    Time 4  Period Weeks   Status New            Plan - 02/23/15 1215    Clinical Impression Statement 49 yo female with stage IV metastatic  breast cancer for almost a year with chemo therapy and radiation to right axiilla , lymph nodes and gamma knife to brain.  She has severe pain with decreased range of motion and strenght in right shoulder and neck with, tenderness to palpation. She has increased circumference measurements of right arm and has painful swelling in right axillary area.    Pt will benefit from skilled therapeutic intervention in order to improve on the following deficits Decreased knowledge of precautions;Decreased strength;Pain;Decreased knowledge of use of DME;Increased edema;Impaired UE functional use;Increased fascial restricitons;Decreased range of motion;Impaired perceived functional ability;Postural dysfunction;Decreased skin integrity;Decreased scar mobility    Rehab Potential Good   Clinical Impairments Affecting Rehab Potential ongoing chemotherapy, unexplained 60# weight loss with likely associated loss of muscle tissue    PT Frequency 2x / week   PT Duration 4 weeks   PT Treatment/Interventions ADLs/Self Care Home Management;Therapeutic exercise;Scar mobilization;Passive range of motion;Patient/family education;Functional mobility training;Manual techniques;Manual lymph drainage;Therapeutic activities   PT Next Visit Plan Begin cervical and upper thoracic gentle range of motion. begin manual lymph drianage to right upper quadrant with gentle myofascial work at lateral chest and upper trap. Codmans exercise, gentle shoulder range of motion, all within pain tolerance   Consulted and Agree with Plan of Care Patient         Problem List Patient Active Problem List   Diagnosis Date Noted  . Brain metastasis 12/04/2014  . Breast cancer metastasized to multiple sites 03/24/2014  . Chest pain 03/19/2014  . Recurrent boils 02/23/2014   Donato Heinz. Owens Shark, PT  02/23/2015, 12:37 PM  [place], [place], [address] Phone: [phone]   Fax:  [phone]

## 2015-02-24 ENCOUNTER — Ambulatory Visit: Payer: 59

## 2015-02-24 DIAGNOSIS — M25511 Pain in right shoulder: Secondary | ICD-10-CM | POA: Diagnosis not present

## 2015-02-24 DIAGNOSIS — I890 Lymphedema, not elsewhere classified: Secondary | ICD-10-CM

## 2015-02-24 DIAGNOSIS — M25611 Stiffness of right shoulder, not elsewhere classified: Secondary | ICD-10-CM

## 2015-02-24 NOTE — Therapy (Signed)
[place], [place], [address] Phone: [phone]   Fax:  [phone]  Physical Therapy Treatment  Patient Details  Name: Bianca Kent MRN: [id number] Date of Birth: Mar 20, 1966 Referring Provider:  [name], MD  Encounter Date: 02/24/2015      PT End of Session - 02/24/15 1527    Visit Number 2   Number of Visits 9   Date for PT Re-Evaluation 03/26/15   PT Start Time 9480   PT Stop Time 1521   PT Time Calculation (min) 50 min      Past Medical History  Diagnosis Date  . Allergy   . Arthritis   . Asthma   . Coronary artery spasm   . PONV (postoperative nausea and vomiting)   . Family history of anesthesia complication     Mother and sisters has angioedema  . Sleep apnea     Does not use machine patient stated "its not bad enough for that"  . Pneumonia     hx of  . UTI (lower urinary tract infection)     Due to small ureters  . Hypertension     pt reports that while taking medication for coronary spasms caused elevated blood pressure. Pt no longer takes meds for coronary spasms or HTN.  . Radiation 08/06/14-09/16/14    right breast, axillary, supraclavicular region   . Cancer     right breast & lymph nodes  . Metastasis from breast cancer 11/26/14    MRI Brain  . Breast cancer 02/2014    right  . History of radiation therapy 12/16/14    Left temporal tumor, SRS treatment    Past Surgical History  Procedure Laterality Date  . Cholecystectomy    . Knee surgery      right  . Tendon repair      finger  . Tonsillectomy    . Sinus surgery with instatrak      without instatrak  . Tubal ligation    . Colonoscopy w/ polypectomy  2008  . Wisdom tooth extraction    . Cardiac catheterization  X 3    no PCI  . Portacath placement Left 03/26/2014    Procedure: INSERTION PORT-A-CATH;  Surgeon: Rolm Bookbinder, MD;  Location: [facility];  Service: General;  Laterality: Left;  . Breast  biopsy with sentinel lymph node biopsy and needle localization      There were no vitals filed for this visit.  Visit Diagnosis:  Pain in joint, shoulder region, right  Stiffness of joint, shoulder region, right  Lymphedema      Subjective Assessment - 02/24/15 1438    Subjective Feeling about same today as last time. Just always hurts.   Currently in Pain? Yes   Pain Score 3    Pain Location Shoulder   Pain Orientation Right   Pain Descriptors / Indicators Aching;Constant   Aggravating Factors  Most of ROM   Pain Relieving Factors Pain medicine, joint distraction               LYMPHEDEMA/ONCOLOGY QUESTIONNAIRE - 02/23/15 1141    Right Upper Extremity Lymphedema   15 cm Proximal to Olecranon Process 34.4 cm   10 cm Proximal to Olecranon Process 28.5 cm   Olecranon Process 24.5 cm   15 cm Proximal to Ulnar Styloid Process 24.7 cm   10 cm Proximal to Ulnar Styloid Process 23.5 cm   Just Proximal to Ulnar Styloid Process 15.8 cm  Across Hand at [employer] 19.5 cm   At [place] of 2nd Digit 6 cm   Left Upper Extremity Lymphedema   15 cm Proximal to Olecranon Process 31 cm   10 cm Proximal to Olecranon Process 29.4 cm   Olecranon Process 24 cm   15 cm Proximal to Ulnar Styloid Process 24.5 cm   10 cm Proximal to Ulnar Styloid Process 22 cm   Just Proximal to Ulnar Styloid Process 15.5 cm   Across Hand at [employer] 18.5 cm   At [place] of 2nd Digit 5.5 cm                  [facility] Adult PT Treatment/Exercise - 02/24/15 0001    Shoulder Exercises: Stretch   Other Shoulder Stretches Codmans Stretches side-side and front-back x 30 seconds each. Pt able to return good demo and reports relief after.   Manual Therapy   Edema Management Issued 1/4" rectangle of gray foam in TG grip stockinette to wear at lateral breast and chip pack in stockinette to wear near axilla, pt also to try this with a sports bra.    Myofascial Release Gently to Rt chest wall  Towel  roll under elbow throughout   Manual Lymphatic Drainage (MLD) Short neck, Lt axilla and Rt inguinal nodes, superficial and deep abdominals, anterior inter-axillary and Rt axillo-inguinal anastomosis, and Rt UE from dorsal hand to lateral shoulder.                   Short Term Clinic Goals - 02/23/15 1231    CC Short Term Goal  #1   Title short term goals=long term goals             Long Term Clinic Goals - 02/23/15 1232    CC Long Term Goal  #1   Title Patient will report a decrease in pain by 50% so they can perform daily activities with greater ease   Time 4   Period Weeks   Status New   CC Long Term Goal  #2   Title Patient will decrease the DASH score to <  45  to demonstrate increased functional use of upper extremity   Baseline 56.82   Time 4   Period Weeks   Status New   CC Long Term Goal  #3   Title Patient will improve shoulder flexion range of motion to 150 degrees to perform activities of daily living and household chores with greater ease   Baseline 140   Time 4   Period Weeks   Status New   CC Long Term Goal  #4   Title Patient will be independent in a basic home program for range of motion and strength    Time 4   Period Weeks   Status New            Plan - 02/24/15 1528    Clinical Impression Statement Pt very tender to touch at Rt chest but as treatment progressed tolerance was slightly improved. Pt noted great relief from wearing the foam and chip pack I made for her today and she reported achiness she normally feels had decreased alot after treatment.    Pt will benefit from skilled therapeutic intervention in order to improve on the following deficits Decreased knowledge of precautions;Decreased strength;Pain;Decreased knowledge of use of DME;Increased edema;Impaired UE functional use;Increased fascial restricitons;Decreased range of motion;Impaired perceived functional ability;Postural dysfunction;Decreased skin integrity;Decreased scar  mobility   Rehab Potential Good  Clinical Impairments Affecting Rehab Potential ongoing chemotherapy, unexplained 60# weight loss with likely associated loss of muscle tissue    PT Frequency 2x / week   PT Duration 4 weeks   PT Treatment/Interventions ADLs/Self Care Home Management;Therapeutic exercise;Scar mobilization;Passive range of motion;Patient/family education;Functional mobility training;Manual techniques;Manual lymph drainage;Therapeutic activities   PT Next Visit Plan Begin cervical and upper thoracic gentle range of motion. continue manual lymph drianage to right upper quadrant with gentle myofascial work at lateral chest and upper trap. Begin gentle shoulder range of motion within pain tolerance   Consulted and Agree with Plan of Care Patient        Problem List Patient Active Problem List   Diagnosis Date Noted  . Brain metastasis 12/04/2014  . Breast cancer metastasized to multiple sites 03/24/2014  . Chest pain 03/19/2014  . Recurrent boils 02/23/2014    Otelia Limes, PTA 02/24/2015, 3:31 PM  [place], [place], [address] Phone: [phone]   Fax:  [phone]

## 2015-02-25 ENCOUNTER — Ambulatory Visit: Payer: 59

## 2015-02-25 ENCOUNTER — Ambulatory Visit (HOSPITAL_BASED_OUTPATIENT_CLINIC_OR_DEPARTMENT_OTHER): Payer: 59 | Admitting: Hematology & Oncology

## 2015-02-25 ENCOUNTER — Ambulatory Visit (HOSPITAL_BASED_OUTPATIENT_CLINIC_OR_DEPARTMENT_OTHER): Payer: 59

## 2015-02-25 ENCOUNTER — Other Ambulatory Visit: Payer: 59

## 2015-02-25 ENCOUNTER — Encounter: Payer: Self-pay | Admitting: Hematology & Oncology

## 2015-02-25 ENCOUNTER — Ambulatory Visit: Payer: 59 | Admitting: Hematology & Oncology

## 2015-02-25 ENCOUNTER — Other Ambulatory Visit (HOSPITAL_BASED_OUTPATIENT_CLINIC_OR_DEPARTMENT_OTHER): Payer: 59

## 2015-02-25 VITALS — BP 119/70 | HR 79 | Temp 97.9°F | Resp 16 | Ht 67.0 in | Wt 166.0 lb

## 2015-02-25 DIAGNOSIS — Z5112 Encounter for antineoplastic immunotherapy: Secondary | ICD-10-CM

## 2015-02-25 DIAGNOSIS — H0520 Unspecified exophthalmos: Secondary | ICD-10-CM | POA: Diagnosis not present

## 2015-02-25 DIAGNOSIS — M2560 Stiffness of unspecified joint, not elsewhere classified: Secondary | ICD-10-CM

## 2015-02-25 DIAGNOSIS — C50911 Malignant neoplasm of unspecified site of right female breast: Secondary | ICD-10-CM

## 2015-02-25 DIAGNOSIS — R591 Generalized enlarged lymph nodes: Secondary | ICD-10-CM

## 2015-02-25 DIAGNOSIS — R29898 Other symptoms and signs involving the musculoskeletal system: Principal | ICD-10-CM

## 2015-02-25 DIAGNOSIS — C7931 Secondary malignant neoplasm of brain: Secondary | ICD-10-CM

## 2015-02-25 DIAGNOSIS — Z170 Estrogen receptor positive status [ER+]: Secondary | ICD-10-CM | POA: Diagnosis not present

## 2015-02-25 DIAGNOSIS — C50912 Malignant neoplasm of unspecified site of left female breast: Secondary | ICD-10-CM

## 2015-02-25 LAB — CBC WITH DIFFERENTIAL (CANCER CENTER ONLY)
BASO#: 0 10*3/uL (ref 0.0–0.2)
BASO%: 0.4 % (ref 0.0–2.0)
EOS%: 2.8 % (ref 0.0–7.0)
Eosinophils Absolute: 0.2 10*3/uL (ref 0.0–0.5)
HCT: 42.7 % (ref 34.8–46.6)
HGB: 14.4 g/dL (ref 11.6–15.9)
LYMPH#: 1.1 10*3/uL (ref 0.9–3.3)
LYMPH%: 19.3 % (ref 14.0–48.0)
MCH: 28.6 pg (ref 26.0–34.0)
MCHC: 33.7 g/dL (ref 32.0–36.0)
MCV: 85 fL (ref 81–101)
MONO#: 0.5 10*3/uL (ref 0.1–0.9)
MONO%: 8.9 % (ref 0.0–13.0)
NEUT%: 68.6 % (ref 39.6–80.0)
NEUTROS ABS: 3.9 10*3/uL (ref 1.5–6.5)
PLATELETS: 142 10*3/uL — AB (ref 145–400)
RBC: 5.04 10*6/uL (ref 3.70–5.32)
RDW: 18.7 % — ABNORMAL HIGH (ref 11.1–15.7)
WBC: 5.7 10*3/uL (ref 3.9–10.0)

## 2015-02-25 LAB — CANCER ANTIGEN 27.29: CA 27.29: 21 U/mL (ref 0–39)

## 2015-02-25 LAB — CMP (CANCER CENTER ONLY)
ALBUMIN: 3.7 g/dL (ref 3.3–5.5)
ALT(SGPT): 43 U/L (ref 10–47)
AST: 45 U/L — ABNORMAL HIGH (ref 11–38)
Alkaline Phosphatase: 189 U/L — ABNORMAL HIGH (ref 26–84)
BUN, Bld: 8 mg/dL (ref 7–22)
CO2: 28 mEq/L (ref 18–33)
Calcium: 9.4 mg/dL (ref 8.0–10.3)
Chloride: 104 mEq/L (ref 98–108)
Creat: 0.8 mg/dl (ref 0.6–1.2)
GLUCOSE: 81 mg/dL (ref 73–118)
POTASSIUM: 4.2 meq/L (ref 3.3–4.7)
SODIUM: 143 meq/L (ref 128–145)
TOTAL PROTEIN: 8.7 g/dL — AB (ref 6.4–8.1)
Total Bilirubin: 0.7 mg/dl (ref 0.20–1.60)

## 2015-02-25 MED ORDER — FOSAPREPITANT DIMEGLUMINE INJECTION 150 MG
150.0000 mg | Freq: Once | INTRAVENOUS | Status: AC
  Administered 2015-02-25: 150 mg via INTRAVENOUS
  Filled 2015-02-25: qty 5

## 2015-02-25 MED ORDER — HEPARIN SOD (PORK) LOCK FLUSH 100 UNIT/ML IV SOLN
500.0000 [IU] | Freq: Once | INTRAVENOUS | Status: AC | PRN
  Administered 2015-02-25: 500 [IU]
  Filled 2015-02-25: qty 5

## 2015-02-25 MED ORDER — DIPHENHYDRAMINE HCL 25 MG PO CAPS
50.0000 mg | ORAL_CAPSULE | Freq: Once | ORAL | Status: AC
  Administered 2015-02-25: 50 mg via ORAL

## 2015-02-25 MED ORDER — SODIUM CHLORIDE 0.9 % IV SOLN
Freq: Once | INTRAVENOUS | Status: AC
  Administered 2015-02-25: 12:00:00 via INTRAVENOUS

## 2015-02-25 MED ORDER — SODIUM CHLORIDE 0.9 % IJ SOLN
10.0000 mL | INTRAMUSCULAR | Status: DC | PRN
  Administered 2015-02-25: 10 mL
  Filled 2015-02-25: qty 10

## 2015-02-25 MED ORDER — DIPHENHYDRAMINE HCL 25 MG PO CAPS
ORAL_CAPSULE | ORAL | Status: AC
  Filled 2015-02-25: qty 2

## 2015-02-25 MED ORDER — ACETAMINOPHEN 325 MG PO TABS
ORAL_TABLET | ORAL | Status: AC
  Filled 2015-02-25: qty 2

## 2015-02-25 MED ORDER — SODIUM CHLORIDE 0.9 % IV SOLN
2.3040 mg/kg | Freq: Once | INTRAVENOUS | Status: AC
  Administered 2015-02-25: 180 mg via INTRAVENOUS
  Filled 2015-02-25: qty 9

## 2015-02-25 MED ORDER — SODIUM CHLORIDE 0.9 % IV SOLN
336.0000 mg | Freq: Once | INTRAVENOUS | Status: AC
  Administered 2015-02-25: 330 mg via INTRAVENOUS
  Filled 2015-02-25: qty 11

## 2015-02-25 MED ORDER — ACETAMINOPHEN 325 MG PO TABS
650.0000 mg | ORAL_TABLET | Freq: Once | ORAL | Status: AC
  Administered 2015-02-25: 650 mg via ORAL

## 2015-02-25 MED ORDER — PALONOSETRON HCL INJECTION 0.25 MG/5ML
0.2500 mg | Freq: Once | INTRAVENOUS | Status: AC
  Administered 2015-02-25: 0.25 mg via INTRAVENOUS

## 2015-02-25 MED ORDER — PALONOSETRON HCL INJECTION 0.25 MG/5ML
INTRAVENOUS | Status: AC
  Filled 2015-02-25: qty 5

## 2015-02-25 NOTE — Patient Instructions (Signed)
[facility] Discharge Instructions for Patients Receiving Chemotherapy  Today you received the following chemotherapy agents Kadcyla and Perjeta.  To help prevent nausea and vomiting after your treatment, we encourage you to take your nausea medication.   If you develop nausea and vomiting that is not controlled by your nausea medication, call the clinic.   BELOW ARE SYMPTOMS THAT SHOULD BE REPORTED IMMEDIATELY:  *FEVER GREATER THAN 100.5 F  *CHILLS WITH OR WITHOUT FEVER  NAUSEA AND VOMITING THAT IS NOT CONTROLLED WITH YOUR NAUSEA MEDICATION  *UNUSUAL SHORTNESS OF BREATH  *UNUSUAL BRUISING OR BLEEDING  TENDERNESS IN MOUTH AND THROAT WITH OR WITHOUT PRESENCE OF ULCERS  *URINARY PROBLEMS  *BOWEL PROBLEMS  UNUSUAL RASH Items with * indicate a potential emergency and should be followed up as soon as possible.  Feel free to call the clinic you have any questions or concerns. The clinic phone number is [phone].  Please show the [facility] at check-in to the Emergency Department and triage nurse.

## 2015-02-26 ENCOUNTER — Other Ambulatory Visit (HOSPITAL_COMMUNITY): Payer: Self-pay | Admitting: Otolaryngology

## 2015-02-26 ENCOUNTER — Telehealth: Payer: Self-pay | Admitting: Hematology & Oncology

## 2015-02-26 NOTE — Telephone Encounter (Signed)
Faxed medical records to: [facility] P: [phone] F: [phone]   CASE: [id number]     [place] SCANNED

## 2015-02-26 NOTE — Progress Notes (Signed)
Hematology and Oncology Follow Up Visit  Bianca Kent [id number] [birth year] 49 y.o. 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 is back for followup . We have a new issue with her. She has some slight proptosis of the left eye. She recently had an infection in the eye.  She also has some double vision with the left eye, particularly when she tries to look medially.  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  I spoke with Dr. Redmond Baseman of ENT. He graciously agreed to see her today. He said this will need to be drained. She may have to be admitted.  She wants to go to the beach on Saturday. I have a feeling that she will not be oh to go so we can do with this ocular issue.  She also is having pain in the right shoulder. An MRI was done. She has some supraspinatus tendinitis. No metastatic disease is noted. No tears were noted. I spoke with Dr. Rhona Raider of orthopedics surgery. He will see her for an injection.  She does feel tired. She's not had not as much nausea vomiting. She's going to the bathroom okay. She's had no leg swelling.  She is doing some lymphedema work in the lymphedema clinic for her right arm. This is helping her a little bit.  She's had a decent appetite. Her weight sees be holding pretty steady.  Overall, her performance status is ECOG 1.   Medications:  Current outpatient prescriptions:  .  ALPRAZolam (XANAX) 1 MG tablet, Take 1 tablet (1 mg total) by mouth at bedtime as needed for anxiety., Disp: 30 tablet, Rfl: 2 .  aspirin (ASPIRIN EC) 81 MG EC tablet, Take 81 mg by  mouth daily. Swallow whole., Disp: , Rfl:  .  calcium carbonate (TUMS - DOSED IN MG ELEMENTAL CALCIUM) 500 MG chewable tablet, Chew 2 tablets by mouth 2 (two) times daily., Disp: , Rfl:  .  diphenhydrAMINE (SOMINEX) 25 MG tablet, Take 100 mg by mouth at bedtime as needed for sleep. Pt states she is taking up to 200 mg Benadryl at night and still not sleeping, Disp: , Rfl:  .  dronabinol (MARINOL) 10 MG capsule, Take 1 capsule (10 mg total) by mouth 3 (three) times daily before meals., Disp: 90 capsule, Rfl: 0 .  ergocalciferol (VITAMIN D2) 50000 UNITS capsule, Take 1 capsule (50,000 Units total) by mouth once a week., Disp: 4 capsule, Rfl: 6 .  fentaNYL (DURAGESIC) 50 MCG/HR, Place 1 patch (50 mcg total) onto the skin every 3 (three) days., Disp: 10 patch, Rfl: 0 .  HYDROmorphone (DILAUDID) 4 MG tablet, Take 1/2 - 1 pill, IF NEEDED, for pain every 4 hrs, Disp: 90 tablet, Rfl: 0 .  lidocaine (XYLOCAINE) 2 % solution, Use as directed 20 mLs in the mouth or throat as needed for mouth pain., Disp: 200 mL, Rfl: 2 .  lidocaine-prilocaine (EMLA) cream, Apply 1 application topically as needed. Placeonto port site 41min prior to treatment and cover with plastic wrap., Disp: 30 g, Rfl: 2 .  loratadine-pseudoephedrine (CLARITIN-D 24-HOUR) 10-240 MG per 24 hr tablet, Take 1 tablet by mouth daily., Disp: , Rfl:  .  LORazepam (ATIVAN) 1 MG tablet, Place 1 tablet under the tongue, IF NEEDED, every 6 hours for nausea., Disp: 60 tablet, Rfl: 2 .  methylphenidate (RITALIN) 10 MG tablet, Take 1 tablet (10 mg total) by mouth 2 (two) times daily., Disp: 60 tablet, Rfl: 0 .  metoCLOPramide (REGLAN) 10 MG tablet, Take 1 tablet (10 mg total) by mouth every 6 (six) hours as needed for nausea., Disp: 120 tablet, Rfl: 5 .  Multiple Vitamin (MULTIVITAMIN) tablet, Take 1 tablet by mouth daily., Disp: , Rfl:  .  naproxen sodium (ANAPROX) 220 MG tablet, Take 220 mg by mouth 2 (two) times daily with a meal., Disp: , Rfl:  .   NONFORMULARY OR COMPOUNDED ITEM, Take by mouth as needed. DUKES MOUTHWASH  SWISH AND SWALLOW, Disp: , Rfl:  .  OLANZapine (ZYPREXA) 5 MG tablet, Take 1 tablet (5 mg total) by mouth at bedtime., Disp: 30 tablet, Rfl: 3 .  ondansetron (ZOFRAN) 8 MG tablet, TAKE 1 TABLET BY MOUTH EVERY 8 HOURS AS NEEDED FOR NAUSEA OR VOMITING, Disp: 20 tablet, Rfl: 2 .  pantoprazole (PROTONIX) 40 MG tablet, Take 1 tablet (40 mg total) by mouth daily., Disp: 30 tablet, Rfl: 6 .  prochlorperazine (COMPAZINE) 10 MG tablet, Take 1 tablet (10 mg total) by mouth every 6 (six) hours as needed for nausea or vomiting., Disp: 60 tablet, Rfl: 2 .  promethazine (PHENERGAN) 25 MG suppository, Place 1 suppository (25 mg total) rectally every 6 (six) hours as needed for nausea or vomiting., Disp: 30 each, Rfl: 3 .  traZODone (DESYREL) 100 MG tablet, Take 1-2 if needed at bed for sleep., Disp: 30 tablet, Rfl: 2 .  albuterol (PROVENTIL HFA;VENTOLIN HFA) 108 (90 BASE) MCG/ACT inhaler, Inhale 2 puffs into the lungs every 4 (four) hours as needed for wheezing or shortness of breath. (Patient not taking: Reported on 02/25/2015), Disp: 2 Inhaler, Rfl: 6 .  Bismuth Subsalicylate (PEPTO-BISMOL PO), Take by mouth as needed., Disp: , Rfl:  .  Lactulose SOLN, Take 2 TBLSP every 6 hours until bowel movement (Patient not taking: Reported on 02/25/2015), Disp: 1000 mL, Rfl: 4 .  pyridoxine (B-6) 250 MG tablet, Take 1 tablet (250 mg total) by mouth daily. (Patient not taking: Reported on 02/25/2015), Disp: 90 tablet, Rfl: 4  Allergies:  Allergies  Allergen Reactions  . Iohexol      Code: RASH, Desc: VERY STRONG FAMILY HX OF ANGIOEDEMA WHEN RECEIVING IV CONTRAST; PT HAS BEEN PREMEDICATED FOR OTHER CONTRASTED STUDIES(IN CATH. LAB)  KR, Onset Date: [id number]   . Prednisone Itching    Capillary beds bust  . Tetanus Toxoids Other (See Comments)    Ran a high fever for 48 hours  . Theophyllines Hives    Mental changes  . Versed [Midazolam] Other (See  Comments)    Pt becomes violent    Past Medical History, Surgical history, Social history, and Family History were reviewed and updated.  Review of Systems: As above  Physical Exam:  height is 5\' 7"  (1.702 m) and weight is 166 lb (75.297 kg). Her oral temperature is 97.9 F (36.6 C). Her blood pressure is 119/70 and her pulse is 79. Her respiration is 16.   Well-developed and well-nourished white female. Head and exam shows no ocular or oral lesions. She has some slight proptosis of the left eye. She has dysfunction of IV cranial nerve  V. There is no erythema or exudate from the left eye. There might be some slight fullness in the right axilla by do not detect any obvious adenopathy. I cannot detect any obvious right supraclavicular lymph nodes. She has improved range of motion of the right shoulder. Left supraclavicular region is okay. No masses are noted. Lungs are clear. Cardiac exam regular rate and rhythm with no murmurs, rubs or bruits.. Abdomen is soft. She has good bowel sounds. There is no palpable liver or spleen tip. Back exam shows decreased tenderness over the lumbar and thoracic spine to palpation. Extremities shows no clubbing, cyanosis or edema. She has 4/5 strength in her legs. Skin exam shows improvement in the radiation dermatitis of the right breast and axilla. There is no open wound. There is some hyper pigmentation. Neurological exam is nonfocal.    Lab Results  Component Value Date   WBC 5.7 02/25/2015   HGB 14.4 02/25/2015   HCT 42.7 02/25/2015   MCV 85 02/25/2015   PLT 142* 02/25/2015     Chemistry      Component Value Date/Time   NA 143 02/25/2015 1027   NA 136 03/24/2014 1147   K 4.2 02/25/2015 1027   K 4.2 03/24/2014 1147   CL 104 02/25/2015 1027   CL 105 03/24/2014 1147   CO2 28 02/25/2015 1027   CO2 21 03/24/2014 1147   BUN 8 02/25/2015 1027   BUN 15 03/24/2014 1147   CREATININE 0.8 02/25/2015 1027   CREATININE 0.83 03/24/2014 1147      Component  Value Date/Time   CALCIUM 9.4 02/25/2015 1027   CALCIUM 8.8 03/24/2014 1147   ALKPHOS 189* 02/25/2015 1027   ALKPHOS 106 03/24/2014 1147   AST 45* 02/25/2015 1027   AST 19 03/24/2014 1147   ALT 43 02/25/2015 1027   ALT 28 03/24/2014 1147   BILITOT 0.70 02/25/2015 1027   BILITOT 0.5 03/24/2014 1147         Impression and Plan: Bianca Kent is 49 year old white female with triple positive metastatic breast cancer. We now have her on Kadcyla. She tolerated her first 8 cycles well. This clearly has helped as the PET scan was pretty much normal. I'm sure that she still has microscopic disease. As such, I really think that we have to continue to treat with Kadcyla.  I do not think that this mucocelel is anything related to breast cancer.  Her last PET scan was done back in early March. We have to repeat this.  She does need to have a echocardiogram done. Her last was done back in December so I think another one would be appropriate to make sure her cardiac function is adequate.  She is on Marinol. We will keep her on Marinol. She actually is able to find the "real" product that she feels is helpful. I have noted that her alkaline phosphatase is going up a little bit more. This is been on the elevated side and I am not sure exactly what this signifies but we will continue to watch this.  I spent about 45 minutes with she and her sister. This was a lot work, located and I would have thought As always, we had a great prayer session.   [name], MD 5/6/20167:23 AM

## 2015-03-02 ENCOUNTER — Encounter (HOSPITAL_COMMUNITY): Payer: Self-pay | Admitting: *Deleted

## 2015-03-02 ENCOUNTER — Telehealth: Payer: Self-pay | Admitting: *Deleted

## 2015-03-02 DIAGNOSIS — C50911 Malignant neoplasm of unspecified site of right female breast: Secondary | ICD-10-CM

## 2015-03-02 MED ORDER — TRAMADOL HCL 50 MG PO TABS: 50.0000 mg | ORAL_TABLET | Freq: Four times a day (QID) | ORAL | Status: DC | PRN

## 2015-03-02 NOTE — Telephone Encounter (Signed)
Patient's sister, [name], calling to notify us that patient is at the beach, and currently incapacitated with back pain. A family member gave her two tramadol which did provide some relief. Patient is currently travelling home today, for surgery in the am. She also had questions about what medications the patient could take prior to surgery in the am. Told the sister that she would need to call the surgeons office regarding medication. Spoke to Dr [name] regarding the back pain. He ordered a tramadol prescription be sent to the pharmacy for pain relief. Prescription called into pharmacy.

## 2015-03-02 NOTE — H&P (Addendum)
03/03/15 11:58 AM  Bianca Kent  PREOPERATIVE HISTORY AND PHYSICAL  CHIEF COMPLAINT: left frontoethmoid mucocele, chronic sinusitis, diplopia  HISTORY: This is a 49 year old with breast cancer and history of bilateral sinus surgery with left cribriform CSF leak repaired with fat who presents with left frontoethmoid mucocele, chronic sinusitis.  She now presents for revision bilateral sinus surgery and Draf III frontal sinusotomy.  Dr. [name], [facility] has discussed the risks (bleeding, infection, orbital injury/blindness, CSF leak, scarring, need for further surgery, etc.), benefits, and alternatives of this procedure. The patient understands the risks and would like to proceed with the procedure. The chances of success of the procedure are >50% and the patient understands this. I personally performed an examination of the patient within 24 hours of the procedure.  PAST MEDICAL HISTORY: Past Medical History  Diagnosis Date  . Allergy   . Arthritis   . Asthma   . Coronary artery spasm   . PONV (postoperative nausea and vomiting)   . Family history of anesthesia complication     Mother and sisters has angioedema  . Sleep apnea     Does not use machine patient stated "its not bad enough for that"  . Pneumonia     hx of  . UTI (lower urinary tract infection)     Due to small ureters  . Hypertension     pt reports that while taking medication for coronary spasms caused elevated blood pressure. Pt no longer takes meds for coronary spasms or HTN.  . Radiation 08/06/14-09/16/14    right breast, axillary, supraclavicular region   . Cancer     right breast & lymph nodes  . Metastasis from breast cancer 11/26/14    MRI Brain  . Breast cancer 02/2014    right  . History of radiation therapy 12/16/14    Left temporal tumor, SRS treatment    PAST SURGICAL HISTORY: Past Surgical History  Procedure Laterality Date  . Cholecystectomy    . Knee surgery      right  . Tendon repair     finger  . Tonsillectomy    . Sinus surgery with instatrak      without instatrak  . Tubal ligation    . Colonoscopy w/ polypectomy  2008  . Wisdom tooth extraction    . Cardiac catheterization  X 3    no PCI  . Portacath placement Left 03/26/2014    Procedure: INSERTION PORT-A-CATH;  Surgeon: Rolm Bookbinder, MD;  Location: [facility];  Service: General;  Laterality: Left;  . Breast biopsy with sentinel lymph node biopsy and needle localization      MEDICATIONS: No current facility-administered medications on file prior to encounter.   Current Outpatient Prescriptions on File Prior to Encounter  Medication Sig Dispense Refill  . albuterol (PROVENTIL HFA;VENTOLIN HFA) 108 (90 BASE) MCG/ACT inhaler Inhale 2 puffs into the lungs every 4 (four) hours as needed for wheezing or shortness of breath. (Patient not taking: Reported on 02/25/2015) 2 Inhaler 6  . ALPRAZolam (XANAX) 1 MG tablet Take 1 tablet (1 mg total) by mouth at bedtime as needed for anxiety. 30 tablet 2  . aspirin (ASPIRIN EC) 81 MG EC tablet Take 81 mg by mouth daily. Swallow whole.    . Bismuth Subsalicylate (PEPTO-BISMOL PO) Take by mouth as needed.    . calcium carbonate (TUMS - DOSED IN MG ELEMENTAL CALCIUM) 500 MG chewable tablet Chew 2 tablets by mouth 2 (two) times daily.    . diphenhydrAMINE ([place]) 25  MG tablet Take 100 mg by mouth at bedtime as needed for sleep. Pt states she is taking up to 200 mg Benadryl at night and still not sleeping    . dronabinol (MARINOL) 10 MG capsule Take 1 capsule (10 mg total) by mouth 3 (three) times daily before meals. 90 capsule 0  . ergocalciferol (VITAMIN D2) 50000 UNITS capsule Take 1 capsule (50,000 Units total) by mouth once a week. 4 capsule 6  . fentaNYL (DURAGESIC) 50 MCG/HR Place 1 patch (50 mcg total) onto the skin every 3 (three) days. 10 patch 0  . HYDROmorphone (DILAUDID) 4 MG tablet Take 1/2 - 1 pill, IF NEEDED, for pain every 4 hrs 90 tablet 0  . Lactulose SOLN Take 2 TBLSP  every 6 hours until bowel movement (Patient not taking: Reported on 02/25/2015) 1000 mL 4  . lidocaine (XYLOCAINE) 2 % solution Use as directed 20 mLs in the mouth or throat as needed for mouth pain. 200 mL 2  . lidocaine-prilocaine (EMLA) cream Apply 1 application topically as needed. Placeonto port site 17min prior to treatment and cover with plastic wrap. 30 g 2  . loratadine-pseudoephedrine (CLARITIN-D 24-HOUR) 10-240 MG per 24 hr tablet Take 1 tablet by mouth daily.    Marland Kitchen LORazepam (ATIVAN) 1 MG tablet Place 1 tablet under the tongue, IF NEEDED, every 6 hours for nausea. 60 tablet 2  . methylphenidate (RITALIN) 10 MG tablet Take 1 tablet (10 mg total) by mouth 2 (two) times daily. 60 tablet 0  . metoCLOPramide (REGLAN) 10 MG tablet Take 1 tablet (10 mg total) by mouth every 6 (six) hours as needed for nausea. 120 tablet 5  . Multiple Vitamin (MULTIVITAMIN) tablet Take 1 tablet by mouth daily.    . naproxen sodium (ANAPROX) 220 MG tablet Take 220 mg by mouth 2 (two) times daily with a meal.    . NONFORMULARY OR COMPOUNDED ITEM Take by mouth as needed. DUKES MOUTHWASH  SWISH AND SWALLOW    . OLANZapine (ZYPREXA) 5 MG tablet Take 1 tablet (5 mg total) by mouth at bedtime. 30 tablet 3  . ondansetron (ZOFRAN) 8 MG tablet TAKE 1 TABLET BY MOUTH EVERY 8 HOURS AS NEEDED FOR NAUSEA OR VOMITING 20 tablet 2  . pantoprazole (PROTONIX) 40 MG tablet Take 1 tablet (40 mg total) by mouth daily. 30 tablet 6  . prochlorperazine (COMPAZINE) 10 MG tablet Take 1 tablet (10 mg total) by mouth every 6 (six) hours as needed for nausea or vomiting. 60 tablet 2  . promethazine (PHENERGAN) 25 MG suppository Place 1 suppository (25 mg total) rectally every 6 (six) hours as needed for nausea or vomiting. 30 each 3  . pyridoxine (B-6) 250 MG tablet Take 1 tablet (250 mg total) by mouth daily. (Patient not taking: Reported on 02/25/2015) 90 tablet 4  . traZODone (DESYREL) 100 MG tablet Take 1-2 if needed at bed for sleep. 30  tablet 2    ALLERGIES: Allergies  Allergen Reactions  . Iohexol      Code: RASH, Desc: VERY STRONG FAMILY HX OF ANGIOEDEMA WHEN RECEIVING IV CONTRAST; PT HAS BEEN PREMEDICATED FOR OTHER CONTRASTED STUDIES(IN CATH. LAB)  KR, Onset Date: [id number]   . Prednisone Itching    Capillary beds bust  . Tetanus Toxoids Other (See Comments)    Ran a high fever for 48 hours  . Theophyllines Hives    Mental changes  . Versed [Midazolam] Other (See Comments)    Pt becomes violent  SOCIAL HISTORY: History   Social History  . Marital Status: Single    Spouse Name: N/A  . Number of Children: 0  . Years of Education: N/A   Occupational History  . RN [employer]   Social History Main Topics  . Smoking status: Former Smoker -- 0.50 packs/day for 24 years    Types: Cigarettes    Start date: 09/23/1984    Quit date: 10/22/2007  . Smokeless tobacco: Never Used     Comment: QUIT 7 YEARS AGO  . Alcohol Use: 0.0 oz/week    0 Standard drinks or equivalent per week     Comment: once a week  . Drug Use: No  . Sexual Activity: Yes    Birth Control/ Protection: Condom   Other Topics Concern  . Not on file   Social History Narrative    FAMILY HISTORY:  Family History  Problem Relation Age of Onset  . Coronary artery disease Mother   . Diabetes Mother   . Hypertension Mother   . Coronary artery disease Father   . Hypertension Father   . Hypertension Sister   . Coronary artery disease Maternal Grandmother   . Stroke Maternal Grandmother   . Depression Maternal Grandmother   . Cancer Maternal Grandmother     colon  . Coronary artery disease Maternal Grandfather   . Stroke Maternal Grandfather   . Depression Maternal Grandfather   . Coronary artery disease Paternal Grandmother   . Cancer Paternal Grandmother   . Cancer Paternal Grandfather     lung  . Hypertension Sister   . Cancer Paternal Aunt     breast    REVIEW OF SYSTEMS:  HEENT: vision changes, sinus drainage,  otherwise negative x 12 systems except per HPI  PHYSICAL EXAM:  GENERAL:  NAD VITAL SIGNS:   Filed Vitals:   03/03/15 1052  BP: 108/61  Pulse: 92  Temp: 98.7 F (37.1 C)  Resp: 18   SKIN:  Warm, dry HEENT:  Left fontal/ethmoid scarring on endoscopy, left globe mildly deflected inferiorly and laterally NECK:  supple  ABDOMEN:  soft MUSCULOSKELETAL: normal strength PSYCH:  Normal affect NEUROLOGIC:  CN 2-12 grossly intact and symmetric  DIAGNOSTIC STUDIES: MRI shows known brain metastases from breast cancer and a left frontoethmoid mucocele with mass effect on the left orbit/lamina papyracea.  ASSESSMENT AND PLAN: Plan to proceed with revision bilateral sinus surgery with Draf III frontal sinusotomy. Patient understands the risks, benefits, and alternatives. Informed written consent signed, witnessed, and on chart. 03/03/15  12:00 PM Bianca Kent

## 2015-03-03 ENCOUNTER — Encounter (HOSPITAL_COMMUNITY)
Admission: RE | Disposition: A | Discharge status: Home Health | Payer: Self-pay | Source: Ambulatory Visit | Attending: Internal Medicine

## 2015-03-03 ENCOUNTER — Ambulatory Visit (HOSPITAL_COMMUNITY): Payer: 59 | Admitting: Certified Registered Nurse Anesthetist

## 2015-03-03 ENCOUNTER — Encounter (HOSPITAL_COMMUNITY): Payer: Self-pay | Admitting: *Deleted

## 2015-03-03 ENCOUNTER — Inpatient Hospital Stay (HOSPITAL_COMMUNITY)
Admission: RE | Admit: 2015-03-03 | Discharge: 2015-03-22 | DRG: 981 | Disposition: A | Discharge status: Home Health | Payer: 59 | Source: Ambulatory Visit | Attending: Internal Medicine | Admitting: Internal Medicine

## 2015-03-03 DIAGNOSIS — D696 Thrombocytopenia, unspecified: Secondary | ICD-10-CM | POA: Diagnosis present

## 2015-03-03 DIAGNOSIS — J9811 Atelectasis: Secondary | ICD-10-CM | POA: Diagnosis present

## 2015-03-03 DIAGNOSIS — R609 Edema, unspecified: Secondary | ICD-10-CM | POA: Diagnosis not present

## 2015-03-03 DIAGNOSIS — R0600 Dyspnea, unspecified: Secondary | ICD-10-CM | POA: Diagnosis not present

## 2015-03-03 DIAGNOSIS — K5900 Constipation, unspecified: Secondary | ICD-10-CM | POA: Diagnosis not present

## 2015-03-03 DIAGNOSIS — G8918 Other acute postprocedural pain: Secondary | ICD-10-CM | POA: Diagnosis present

## 2015-03-03 DIAGNOSIS — G960 Cerebrospinal fluid leak: Secondary | ICD-10-CM | POA: Diagnosis present

## 2015-03-03 DIAGNOSIS — Z170 Estrogen receptor positive status [ER+]: Secondary | ICD-10-CM

## 2015-03-03 DIAGNOSIS — J986 Disorders of diaphragm: Secondary | ICD-10-CM | POA: Diagnosis not present

## 2015-03-03 DIAGNOSIS — G894 Chronic pain syndrome: Secondary | ICD-10-CM | POA: Diagnosis present

## 2015-03-03 DIAGNOSIS — R109 Unspecified abdominal pain: Secondary | ICD-10-CM | POA: Insufficient documentation

## 2015-03-03 DIAGNOSIS — G4701 Insomnia due to medical condition: Secondary | ICD-10-CM

## 2015-03-03 DIAGNOSIS — Z7982 Long term (current) use of aspirin: Secondary | ICD-10-CM

## 2015-03-03 DIAGNOSIS — J9601 Acute respiratory failure with hypoxia: Secondary | ICD-10-CM | POA: Diagnosis present

## 2015-03-03 DIAGNOSIS — D649 Anemia, unspecified: Secondary | ICD-10-CM | POA: Diagnosis present

## 2015-03-03 DIAGNOSIS — R0902 Hypoxemia: Secondary | ICD-10-CM | POA: Diagnosis not present

## 2015-03-03 DIAGNOSIS — R0689 Other abnormalities of breathing: Secondary | ICD-10-CM | POA: Insufficient documentation

## 2015-03-03 DIAGNOSIS — J341 Cyst and mucocele of nose and nasal sinus: Secondary | ICD-10-CM | POA: Diagnosis present

## 2015-03-03 DIAGNOSIS — C50911 Malignant neoplasm of unspecified site of right female breast: Secondary | ICD-10-CM | POA: Diagnosis not present

## 2015-03-03 DIAGNOSIS — E8770 Fluid overload, unspecified: Secondary | ICD-10-CM | POA: Diagnosis not present

## 2015-03-03 DIAGNOSIS — Z923 Personal history of irradiation: Secondary | ICD-10-CM | POA: Diagnosis not present

## 2015-03-03 DIAGNOSIS — Z853 Personal history of malignant neoplasm of breast: Secondary | ICD-10-CM | POA: Diagnosis not present

## 2015-03-03 DIAGNOSIS — J4530 Mild persistent asthma, uncomplicated: Secondary | ICD-10-CM | POA: Diagnosis present

## 2015-03-03 DIAGNOSIS — I959 Hypotension, unspecified: Secondary | ICD-10-CM | POA: Diagnosis not present

## 2015-03-03 DIAGNOSIS — G4733 Obstructive sleep apnea (adult) (pediatric): Secondary | ICD-10-CM | POA: Diagnosis not present

## 2015-03-03 DIAGNOSIS — C7931 Secondary malignant neoplasm of brain: Secondary | ICD-10-CM | POA: Diagnosis present

## 2015-03-03 DIAGNOSIS — R410 Disorientation, unspecified: Secondary | ICD-10-CM | POA: Diagnosis not present

## 2015-03-03 DIAGNOSIS — Y95 Nosocomial condition: Secondary | ICD-10-CM | POA: Diagnosis present

## 2015-03-03 DIAGNOSIS — B370 Candidal stomatitis: Secondary | ICD-10-CM | POA: Diagnosis not present

## 2015-03-03 DIAGNOSIS — F329 Major depressive disorder, single episode, unspecified: Secondary | ICD-10-CM | POA: Diagnosis present

## 2015-03-03 DIAGNOSIS — T501X5A Adverse effect of loop [high-ceiling] diuretics, initial encounter: Secondary | ICD-10-CM | POA: Diagnosis not present

## 2015-03-03 DIAGNOSIS — Z9889 Other specified postprocedural states: Secondary | ICD-10-CM | POA: Diagnosis not present

## 2015-03-03 DIAGNOSIS — R64 Cachexia: Secondary | ICD-10-CM

## 2015-03-03 DIAGNOSIS — R5082 Postprocedural fever: Secondary | ICD-10-CM | POA: Diagnosis not present

## 2015-03-03 DIAGNOSIS — Z7189 Other specified counseling: Secondary | ICD-10-CM | POA: Diagnosis not present

## 2015-03-03 DIAGNOSIS — H532 Diplopia: Secondary | ICD-10-CM | POA: Diagnosis present

## 2015-03-03 DIAGNOSIS — Z791 Long term (current) use of non-steroidal anti-inflammatories (NSAID): Secondary | ICD-10-CM

## 2015-03-03 DIAGNOSIS — R0782 Intercostal pain: Secondary | ICD-10-CM | POA: Diagnosis not present

## 2015-03-03 DIAGNOSIS — G5681 Other specified mononeuropathies of right upper limb: Secondary | ICD-10-CM | POA: Diagnosis not present

## 2015-03-03 DIAGNOSIS — Z515 Encounter for palliative care: Secondary | ICD-10-CM | POA: Diagnosis not present

## 2015-03-03 DIAGNOSIS — K1230 Oral mucositis (ulcerative), unspecified: Secondary | ICD-10-CM | POA: Diagnosis not present

## 2015-03-03 DIAGNOSIS — J189 Pneumonia, unspecified organism: Secondary | ICD-10-CM | POA: Diagnosis present

## 2015-03-03 DIAGNOSIS — R0602 Shortness of breath: Secondary | ICD-10-CM | POA: Diagnosis not present

## 2015-03-03 DIAGNOSIS — J9600 Acute respiratory failure, unspecified whether with hypoxia or hypercapnia: Secondary | ICD-10-CM | POA: Insufficient documentation

## 2015-03-03 DIAGNOSIS — C50919 Malignant neoplasm of unspecified site of unspecified female breast: Secondary | ICD-10-CM | POA: Diagnosis not present

## 2015-03-03 DIAGNOSIS — R630 Anorexia: Secondary | ICD-10-CM | POA: Diagnosis not present

## 2015-03-03 DIAGNOSIS — J9602 Acute respiratory failure with hypercapnia: Secondary | ICD-10-CM | POA: Diagnosis present

## 2015-03-03 DIAGNOSIS — F419 Anxiety disorder, unspecified: Secondary | ICD-10-CM | POA: Diagnosis present

## 2015-03-03 DIAGNOSIS — R079 Chest pain, unspecified: Secondary | ICD-10-CM

## 2015-03-03 DIAGNOSIS — R5383 Other fatigue: Secondary | ICD-10-CM

## 2015-03-03 DIAGNOSIS — G588 Other specified mononeuropathies: Secondary | ICD-10-CM | POA: Insufficient documentation

## 2015-03-03 DIAGNOSIS — G5680 Other specified mononeuropathies of unspecified upper limb: Secondary | ICD-10-CM | POA: Insufficient documentation

## 2015-03-03 DIAGNOSIS — E876 Hypokalemia: Secondary | ICD-10-CM | POA: Diagnosis not present

## 2015-03-03 DIAGNOSIS — J690 Pneumonitis due to inhalation of food and vomit: Secondary | ICD-10-CM | POA: Diagnosis not present

## 2015-03-03 DIAGNOSIS — K219 Gastro-esophageal reflux disease without esophagitis: Secondary | ICD-10-CM | POA: Diagnosis present

## 2015-03-03 DIAGNOSIS — Z87891 Personal history of nicotine dependence: Secondary | ICD-10-CM

## 2015-03-03 HISTORY — DX: Reserved for inherently not codable concepts without codable children: IMO0001

## 2015-03-03 HISTORY — DX: Gastro-esophageal reflux disease without esophagitis: K21.9

## 2015-03-03 HISTORY — PX: SINUS ENDO W/FUSION: SHX777

## 2015-03-03 HISTORY — PX: SINUSOTOMY: SHX291

## 2015-03-03 SURGERY — SINUS SURGERY, ENDOSCOPIC, USING COMPUTER-ASSISTED NAVIGATION
Anesthesia: General | Site: Nose | Laterality: Bilateral

## 2015-03-03 MED ORDER — CLINDAMYCIN PHOSPHATE 600 MG/50ML IV SOLN
600.0000 mg | Freq: Once | INTRAVENOUS | Status: AC
  Administered 2015-03-03: 600 mg via INTRAVENOUS
  Filled 2015-03-03: qty 50

## 2015-03-03 MED ORDER — MUPIROCIN 2 % EX OINT
TOPICAL_OINTMENT | CUTANEOUS | Status: AC
  Filled 2015-03-03: qty 22

## 2015-03-03 MED ORDER — ONDANSETRON HCL 4 MG/2ML IJ SOLN
INTRAMUSCULAR | Status: AC
  Filled 2015-03-03: qty 2

## 2015-03-03 MED ORDER — PROPOFOL 10 MG/ML IV BOLUS
INTRAVENOUS | Status: AC
  Filled 2015-03-03: qty 20

## 2015-03-03 MED ORDER — MIDAZOLAM HCL 2 MG/2ML IJ SOLN
INTRAMUSCULAR | Status: AC
Start: 2015-03-03 — End: 2015-03-03
  Filled 2015-03-03: qty 2

## 2015-03-03 MED ORDER — LIDOCAINE-EPINEPHRINE 1 %-1:100000 IJ SOLN
INTRAMUSCULAR | Status: AC
Start: 2015-03-03 — End: 2015-03-03
  Filled 2015-03-03: qty 1

## 2015-03-03 MED ORDER — ARTIFICIAL TEARS OP OINT
TOPICAL_OINTMENT | OPHTHALMIC | Status: AC
  Filled 2015-03-03: qty 3.5

## 2015-03-03 MED ORDER — OXYCODONE HCL 5 MG/5ML PO SOLN
5.0000 mg | Freq: Once | ORAL | Status: AC | PRN
  Administered 2015-03-03: 5 mg via ORAL

## 2015-03-03 MED ORDER — ROCURONIUM BROMIDE 50 MG/5ML IV SOLN
INTRAVENOUS | Status: AC
  Filled 2015-03-03: qty 1

## 2015-03-03 MED ORDER — LACTATED RINGERS IV SOLN
INTRAVENOUS | Status: DC
  Administered 2015-03-03: 12:00:00 via INTRAVENOUS

## 2015-03-03 MED ORDER — SODIUM CHLORIDE 0.9 % IV SOLN
INTRAVENOUS | Status: DC
  Administered 2015-03-04: 06:00:00 via INTRAVENOUS

## 2015-03-03 MED ORDER — LIDOCAINE-EPINEPHRINE 1 %-1:100000 IJ SOLN
INTRAMUSCULAR | Status: DC | PRN
  Administered 2015-03-03: 8 mL

## 2015-03-03 MED ORDER — HYDROMORPHONE HCL 1 MG/ML IJ SOLN
0.5000 mg | INTRAMUSCULAR | Status: AC | PRN
  Administered 2015-03-03 (×4): 0.5 mg via INTRAVENOUS

## 2015-03-03 MED ORDER — HEMOSTATIC AGENTS (NO CHARGE) OPTIME
TOPICAL | Status: DC | PRN
  Administered 2015-03-03: 1 via TOPICAL

## 2015-03-03 MED ORDER — LIDOCAINE HCL (CARDIAC) 20 MG/ML IV SOLN
INTRAVENOUS | Status: DC | PRN
  Administered 2015-03-03: 60 mg via INTRAVENOUS

## 2015-03-03 MED ORDER — HEMOSTATIC AGENTS (NO CHARGE) OPTIME
TOPICAL | Status: DC | PRN
  Administered 2015-03-03 (×2): 1

## 2015-03-03 MED ORDER — PHENYLEPHRINE HCL 10 MG/ML IJ SOLN
10.0000 mg | INTRAVENOUS | Status: DC | PRN
  Administered 2015-03-03: 20 ug/min via INTRAVENOUS

## 2015-03-03 MED ORDER — OXYMETAZOLINE HCL 0.05 % NA SOLN
NASAL | Status: DC | PRN
  Administered 2015-03-03: 1 via NASAL

## 2015-03-03 MED ORDER — GLYCOPYRROLATE 0.2 MG/ML IJ SOLN
INTRAMUSCULAR | Status: DC | PRN
  Administered 2015-03-03: 0.4 mg via INTRAVENOUS

## 2015-03-03 MED ORDER — HYDROMORPHONE HCL 1 MG/ML IJ SOLN
INTRAMUSCULAR | Status: AC
  Administered 2015-03-03: 0.5 mg via INTRAVENOUS
  Filled 2015-03-03: qty 2

## 2015-03-03 MED ORDER — LACTATED RINGERS IV SOLN
INTRAVENOUS | Status: DC | PRN
  Administered 2015-03-03 (×2): via INTRAVENOUS

## 2015-03-03 MED ORDER — FENTANYL CITRATE (PF) 100 MCG/2ML IJ SOLN
INTRAMUSCULAR | Status: DC | PRN
  Administered 2015-03-03 (×10): 50 ug via INTRAVENOUS

## 2015-03-03 MED ORDER — ONDANSETRON HCL 4 MG/2ML IJ SOLN
4.0000 mg | INTRAMUSCULAR | Status: DC | PRN
  Administered 2015-03-18 – 2015-03-20 (×3): 4 mg via INTRAVENOUS
  Filled 2015-03-03 (×3): qty 2

## 2015-03-03 MED ORDER — ONDANSETRON HCL 4 MG/2ML IJ SOLN
INTRAMUSCULAR | Status: DC | PRN
  Administered 2015-03-03: 4 mg via INTRAVENOUS

## 2015-03-03 MED ORDER — ONDANSETRON HCL 4 MG/2ML IJ SOLN: 4.0000 mg | Freq: Four times a day (QID) | INTRAMUSCULAR | Status: DC | PRN

## 2015-03-03 MED ORDER — FENTANYL CITRATE (PF) 250 MCG/5ML IJ SOLN
INTRAMUSCULAR | Status: AC
  Filled 2015-03-03: qty 5

## 2015-03-03 MED ORDER — PROPOFOL 10 MG/ML IV BOLUS
INTRAVENOUS | Status: DC | PRN
  Administered 2015-03-03: 170 mg via INTRAVENOUS

## 2015-03-03 MED ORDER — OXYCODONE HCL 5 MG PO TABS: 5.0000 mg | ORAL_TABLET | Freq: Once | ORAL | Status: AC | PRN

## 2015-03-03 MED ORDER — FENTANYL CITRATE (PF) 100 MCG/2ML IJ SOLN
25.0000 ug | INTRAMUSCULAR | Status: DC | PRN
  Administered 2015-03-03 (×3): 50 ug via INTRAVENOUS

## 2015-03-03 MED ORDER — OXYCODONE HCL 5 MG/5ML PO SOLN
ORAL | Status: AC
  Filled 2015-03-03: qty 5

## 2015-03-03 MED ORDER — NEOSTIGMINE METHYLSULFATE 10 MG/10ML IV SOLN
INTRAVENOUS | Status: DC | PRN
  Administered 2015-03-03: 3 mg via INTRAVENOUS

## 2015-03-03 MED ORDER — OXYCODONE HCL 5 MG PO TABS
5.0000 mg | ORAL_TABLET | ORAL | Status: DC | PRN
  Administered 2015-03-03: 10 mg via ORAL
  Administered 2015-03-04: 5 mg via ORAL
  Administered 2015-03-04 – 2015-03-09 (×10): 10 mg via ORAL
  Administered 2015-03-11 – 2015-03-14 (×4): 5 mg via ORAL
  Administered 2015-03-14: 10 mg via ORAL
  Administered 2015-03-15 (×2): 5 mg via ORAL
  Administered 2015-03-16 – 2015-03-17 (×5): 10 mg via ORAL
  Administered 2015-03-17: 5 mg via ORAL
  Administered 2015-03-18 – 2015-03-19 (×7): 10 mg via ORAL
  Filled 2015-03-03: qty 1
  Filled 2015-03-03 (×7): qty 2
  Filled 2015-03-03 (×2): qty 1
  Filled 2015-03-03 (×2): qty 2
  Filled 2015-03-03 (×2): qty 1
  Filled 2015-03-03 (×9): qty 2
  Filled 2015-03-03: qty 1
  Filled 2015-03-03: qty 2
  Filled 2015-03-03: qty 1
  Filled 2015-03-03 (×3): qty 2
  Filled 2015-03-03 (×2): qty 1
  Filled 2015-03-03 (×4): qty 2

## 2015-03-03 MED ORDER — SCOPOLAMINE 1 MG/3DAYS TD PT72
1.0000 | MEDICATED_PATCH | TRANSDERMAL | Status: DC
  Administered 2015-03-03 – 2015-03-09 (×3): 1.5 mg via TRANSDERMAL
  Filled 2015-03-03 (×2): qty 1

## 2015-03-03 MED ORDER — 0.9 % SODIUM CHLORIDE (POUR BTL) OPTIME
TOPICAL | Status: DC | PRN
  Administered 2015-03-03: 1000 mL

## 2015-03-03 MED ORDER — OXYMETAZOLINE HCL 0.05 % NA SOLN
NASAL | Status: AC
  Filled 2015-03-03: qty 15

## 2015-03-03 MED ORDER — IBUPROFEN 200 MG PO TABS
400.0000 mg | ORAL_TABLET | Freq: Four times a day (QID) | ORAL | Status: DC | PRN
  Administered 2015-03-04 – 2015-03-08 (×4): 400 mg via ORAL
  Filled 2015-03-03 (×3): qty 1
  Filled 2015-03-03: qty 2

## 2015-03-03 MED ORDER — FENTANYL CITRATE (PF) 100 MCG/2ML IJ SOLN
INTRAMUSCULAR | Status: AC
  Administered 2015-03-03: 50 ug via INTRAVENOUS
  Filled 2015-03-03: qty 4

## 2015-03-03 MED ORDER — LIDOCAINE HCL (CARDIAC) 20 MG/ML IV SOLN
INTRAVENOUS | Status: AC
  Filled 2015-03-03: qty 5

## 2015-03-03 MED ORDER — ROCURONIUM BROMIDE 100 MG/10ML IV SOLN
INTRAVENOUS | Status: DC | PRN
  Administered 2015-03-03: 40 mg via INTRAVENOUS

## 2015-03-03 MED ORDER — SCOPOLAMINE 1 MG/3DAYS TD PT72
MEDICATED_PATCH | TRANSDERMAL | Status: AC
  Filled 2015-03-03: qty 1

## 2015-03-03 MED ORDER — SODIUM CHLORIDE 0.9 % IR SOLN
Status: DC | PRN
  Administered 2015-03-03 (×2): 1000 mL

## 2015-03-03 MED ORDER — HYDROMORPHONE HCL 1 MG/ML IJ SOLN
1.0000 mg | INTRAMUSCULAR | Status: DC | PRN
  Administered 2015-03-04: 1 mg via INTRAVENOUS
  Filled 2015-03-03: qty 2
  Filled 2015-03-03: qty 1

## 2015-03-03 MED ORDER — ARTIFICIAL TEARS OP OINT
TOPICAL_OINTMENT | OPHTHALMIC | Status: DC | PRN
  Administered 2015-03-03: 1 via OPHTHALMIC

## 2015-03-03 MED ORDER — GLYCOPYRROLATE 0.2 MG/ML IJ SOLN
INTRAMUSCULAR | Status: AC
  Filled 2015-03-03: qty 2

## 2015-03-03 SURGICAL SUPPLY — 62 items
APL SKNCLS STERI-STRIP NONHPOA (GAUZE/BANDAGES/DRESSINGS) ×1
ATTRACTOMAT 16X20 MAGNETIC DRP (DRAPES) IMPLANT
BENZOIN TINCTURE PRP APPL 2/3 (GAUZE/BANDAGES/DRESSINGS) ×2 IMPLANT
BLADE RAD 40 CVD SINUS 4MM (BLADE) IMPLANT
BLADE RAD60 ROTATE M4 4 5PK (BLADE) ×1 IMPLANT
BLADE ROTATE RAD 40 4 M4 (BLADE) IMPLANT
BLADE ROTATE TRICUT 4X13 M4 (BLADE) ×2 IMPLANT
BLADE SURG 15 STRL LF DISP TIS (BLADE) IMPLANT
BLADE SURG 15 STRL SS (BLADE) ×2
BUR DIAMOND 13X5 70D (BURR) IMPLANT
BUR DIAMOND 15X3.2 40D (BURR) ×1 IMPLANT
BUR DIAMOND CURV 15X5 15D (BURR) IMPLANT
CANISTER SUCTION 2500CC (MISCELLANEOUS) ×3 IMPLANT
COAGULATOR SUCT SWTCH 10FR 6 (ELECTROSURGICAL) ×2 IMPLANT
CONT SPEC 4OZ CLIKSEAL STRL BL (MISCELLANEOUS) IMPLANT
CORDS BIPOLAR (ELECTRODE) ×1 IMPLANT
CRADLE DONUT ADULT HEAD (MISCELLANEOUS) ×1 IMPLANT
DRAPE PROXIMA HALF (DRAPES) ×1 IMPLANT
DRESSING NASAL POPE 10X1.5X2.5 (GAUZE/BANDAGES/DRESSINGS) IMPLANT
DRSG NASAL POPE 10X1.5X2.5 (GAUZE/BANDAGES/DRESSINGS) ×4
DRSG NASOPORE 8CM (GAUZE/BANDAGES/DRESSINGS) ×2 IMPLANT
DURASEAL SPINE SEALANT 3ML (MISCELLANEOUS) ×2 IMPLANT
ELECT REM PT RETURN 9FT ADLT (ELECTROSURGICAL) ×2
ELECTRODE REM PT RTRN 9FT ADLT (ELECTROSURGICAL) IMPLANT
FILTER ARTHROSCOPY CONVERTOR (FILTER) ×3 IMPLANT
FLOSEAL 10ML (HEMOSTASIS) IMPLANT
GLOVE BIOGEL PI IND STRL 7.0 (GLOVE) IMPLANT
GLOVE BIOGEL PI INDICATOR 7.0 (GLOVE) ×1
GLOVE SURG SS PI 6.5 STRL IVOR (GLOVE) ×2 IMPLANT
GLOVE SURG SS PI 7.5 STRL IVOR (GLOVE) ×2 IMPLANT
GOWN STRL REUS W/ TWL LRG LVL3 (GOWN DISPOSABLE) ×2 IMPLANT
GOWN STRL REUS W/TWL LRG LVL3 (GOWN DISPOSABLE) ×6
KIT BASIN OR (CUSTOM PROCEDURE TRAY) ×2 IMPLANT
KIT ROOM TURNOVER OR (KITS) ×2 IMPLANT
NDL HYPO 25GX1X1/2 BEV (NEEDLE) IMPLANT
NDL SPNL 20GX3.5 QUINCKE YW (NEEDLE) ×1 IMPLANT
NEEDLE HYPO 25GX1X1/2 BEV (NEEDLE) ×2 IMPLANT
NEEDLE SPNL 20GX3.5 QUINCKE YW (NEEDLE) ×2 IMPLANT
NS IRRIG 1000ML POUR BTL (IV SOLUTION) ×2 IMPLANT
PAD ARMBOARD 7.5X6 YLW CONV (MISCELLANEOUS) ×3 IMPLANT
PAD ENT ADHESIVE 25PK (MISCELLANEOUS) ×2 IMPLANT
PATTIES SURGICAL .5 X3 (DISPOSABLE) ×2 IMPLANT
SHEATH ENDOSCRUB 0 DEG (SHEATH) ×2 IMPLANT
SHEATH ENDOSCRUB 30 DEG (SHEATH) IMPLANT
SHEATH ENDOSCRUB 45 DEG (SHEATH) ×2 IMPLANT
SOLUTION ANTI FOG 6CC (MISCELLANEOUS) ×2 IMPLANT
SPECIMEN JAR SMALL (MISCELLANEOUS) ×4 IMPLANT
SPONGE SURGIFOAM ABS GEL 12-7 (HEMOSTASIS) ×1 IMPLANT
STRIP CLOSURE SKIN 1/2X4 (GAUZE/BANDAGES/DRESSINGS) ×2 IMPLANT
SURGIFLO W/THROMBIN 8M KIT (HEMOSTASIS) ×1 IMPLANT
SUT ETHILON 3 0 PS 1 (SUTURE) IMPLANT
SUT SILK 2 0 SH (SUTURE) ×4 IMPLANT
SWAB COLLECTION DEVICE MRSA (MISCELLANEOUS) ×1 IMPLANT
SYR 50ML SLIP (SYRINGE) IMPLANT
SYRINGE 10CC LL (SYRINGE) ×2 IMPLANT
TOWEL OR 17X24 6PK STRL BLUE (TOWEL DISPOSABLE) ×2 IMPLANT
TRACKER ENT INSTRUMENT (MISCELLANEOUS) ×2 IMPLANT
TRACKER ENT PATIENT (MISCELLANEOUS) ×2 IMPLANT
TRAY ENT MC OR (CUSTOM PROCEDURE TRAY) ×2 IMPLANT
TUBE CONNECTING 12X1/4 (SUCTIONS) ×2 IMPLANT
TUBING STRAIGHTSHOT EPS 5PK (TUBING) ×1 IMPLANT
WIPE INSTRUMENT VISIWIPE 73X73 (MISCELLANEOUS) ×2 IMPLANT

## 2015-03-03 NOTE — Transfer of Care (Signed)
Immediate Anesthesia Transfer of Care Note  Patient: Bianca Kent  Procedure(s) Performed: Procedure(s): ENDOSCOPIC SINUS SURGERY WITH NAVIGATION (Bilateral)  Patient Location: PACU  Anesthesia Type:General  Level of Consciousness: awake, alert  and oriented  Airway & Oxygen Therapy: Patient Spontanous Breathing and Patient connected to face mask oxygen  Post-op Assessment: Report given to RN, Post -op Vital signs reviewed and stable and Patient moving all extremities X 4  Post vital signs: Reviewed and stable  Last Vitals:  Filed Vitals:   03/03/15 1052  BP: 108/61  Pulse: 92  Temp: 37.1 C  Resp: 18    Complications: No apparent anesthesia complications

## 2015-03-03 NOTE — Op Note (Signed)
6:46 PM  03/03/2015  Surgeon: [name]  Procedures Performed: [id number] image guidance [phone] bilateral revision total ethmoidectomies [phone] revision bilateral Draf 3 frontal sinusotomies  Operative Findings: left fronto-ethmoid mucocele widely opened, cultured, and drained, and left frontal sinus communicated into right frontal sinus in Draf III frontal sinusotomy fashion. Anterior cribriform CSF leak from mucocele repaired with cautery and duraseal.  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  ESTIMATED BLOOD LOSS: Approximately 250 mL.  COMPLICATIONS: none  HISTORY OF PRESENT ILLNESS: The patient is a  49yo female with history of sinus surgery with left medial ethmoid/cribriform plate CSF leak with prior repair. She developed a left fronto-ethmoid mucocele in this area with some diplopia and lateral mass effect on the orbit so she is brought to the OR today for repair.Marland Kitchen  PROCEDURE: The patient was brought to the operating room and placed in the supine position. After adequate endotracheal anesthesia was obtained, the skin was draped in sterile fashion. Lidocaine 1% with 1:100,000 epinephrine was injected into the bilateral greater palatine foramina transorally. She was registered to the image guidance system with good accuracy and precision.   Attention then was directed toward the left sinuses. Lidocaine 1% with 1:100,000 epinephrine was injected in the region of the anterior portion of the left middle turbinate remnant.the left maxillary antrostomy and left sphenoidotomy were widely  patent and well-healed.  I used the image guidance curved suction to identify the floor of the left anterior ethmoid/frontal mucocele and I opened this using the 55 degree Kuhn-Bolger curette. Immediately copious purulent mucous flowed out and this was cultured and thoroughly irrigated and suctioned out. The residual  anterior and posterior ethmoid air cells around the mucocele were entered using the image guidance suction and dissected up to the skull base and out to the lamina papyrecea using the 55 degree curette and the 35mm Kerrison punch. All of the ethmoid cells were meticulously dissected out using the Kerrison and debrider. On the medial side of the mucocele pocket there was a small, low-flow, mild CSF leak from the anterior cribriform plate.  Next using the 45 degree endoscope, 60 degree microdebrider, and curved image guidance suction the skull base was identified and dissected anteriorly using the 90 degree Kuhn-Bolger curette. The residual Agger Nasi cell was taken down and then the left frontal sinus was widely opened using the curved debrider, Stammberger 39mm frontal mushroom punch, and the 90 degree curette. The remnant anterior left middle turbinate was then trimmed using the side to side frontal grasper and then I used the 90 degree currete to make an anterior superior septectomy over to the right frontal sinus.  Attention then was directed toward the right sinuses. The right maxillary and sphenoid sinuses were patent and well healed.The residual right anterior and posterior ethmoid air cells were entered after identifying them with the image guidance suction and dissected up to the skull base and out to the lamina papyrecea using the 55 degree curette and the 25mm Kerrison punch. All of the residual right ethmoid cells were meticulously dissected out using the Kerrison and debrider. The skull base and lamina papyracea were preserved throughout.   Next, using the 45 degree endoscope, 60 degree  microdebrider, and curved image guidance suction, the skull base was identified and dissected anteriorly using the 90 degree Kuhn-Bolger currette. The residual Agger Nasi cell was taken down and then the right frontal sinus was widely opened using the curved debrider, Stammberger 39mm frontal mushroom punch, and the 90 degree currette. The anterior remnant right middle turbinate remnant was trimmed using the side-to-side frontal grasper.  I then used the 60 degree debrider to widen the anterior superior septectomy and I used the 40 degree diamond 43mm burr to remove the frontal intersinus septum to communicate the right and left frontal sinuses, completing the Draf 3 frontal sinusotomy. I repaired the preexisting anterior cribriform CSF leak using the Bovie cautery with complete cessation of the minimal CSF leak. I then bolstered the anterior cribriform with duraseal sealant. I packed the Draf 3 sinusotomy with absorbable gelfoam and placed 8cm absorbable nasopore in the ethmoid cavities bilaterally.  A thorough irrigation was then carried out in the nasal cavity, and the patient's stomach was suctioned out using a flexible OGtube. The patient was awakened from anesthesia and extubated without difficulty. The patient tolerated the procedure well and returned to the recovery room in stable condition.   Dr. [name] was present and performed the entire procedure.  6:46 PM [name] 03/03/2015

## 2015-03-03 NOTE — Anesthesia Procedure Notes (Signed)
Procedure Name: Intubation Date/Time: 03/03/2015 3:35 PM Performed by: Garrison Columbus T Pre-anesthesia Checklist: Patient identified, Emergency Drugs available, Suction available and Patient being monitored Patient Re-evaluated:Patient Re-evaluated prior to inductionOxygen Delivery Method: Circle system utilized Preoxygenation: Pre-oxygenation with 100% oxygen Intubation Type: IV induction Ventilation: Mask ventilation without difficulty and Oral airway inserted - appropriate to patient size Laryngoscope Size: Sabra Heck and 2 Grade View: Grade I Tube type: Oral Tube size: 7.5 mm Number of attempts: 1 Airway Equipment and Method: Stylet and Oral airway Placement Confirmation: ETT inserted through vocal cords under direct vision,  positive ETCO2 and breath sounds checked- equal and bilateral Secured at: 21 cm Tube secured with: Tape Dental Injury: Teeth and Oropharynx as per pre-operative assessment

## 2015-03-03 NOTE — Anesthesia Postprocedure Evaluation (Signed)
  Anesthesia Post-op Note  Patient: Bianca Kent  Procedure(s) Performed: Procedure(s): ENDOSCOPIC SINUS SURGERY WITH NAVIGATION (Bilateral)  Patient Location: PACU  Anesthesia Type: General   Level of Consciousness: awake, alert  and oriented  Airway and Oxygen Therapy: Patient Spontanous Breathing  Post-op Pain: moderate  Post-op Assessment: Post-op Vital signs reviewed  Post-op Vital Signs: Reviewed  Last Vitals:  Filed Vitals:   03/03/15 1950  BP: 121/72  Pulse: 101  Temp:   Resp: 27    Complications: No apparent anesthesia complications

## 2015-03-03 NOTE — Discharge Instructions (Addendum)
Rx for hydrocodone is on front of chart. If taking other narcotic pain medications take them OR the hydrocodone, NOT both.  No nose blowing x 2 weeks, no heavy lifting >25 lbs x 2 weeks.  Nasal packing is absorbable.  Rx for antibiotic and zofran sent to pharmacy.  Follow up with Dr. [name] in 2 weeks.    General Anesthesia, Care After Refer to this sheet in the next few weeks. These instructions provide you with information on caring for yourself after your procedure. Your health care provider may also give you more specific instructions. Your treatment has been planned according to current medical practices, but problems sometimes occur. Call your health care provider if you have any problems or questions after your procedure. WHAT TO EXPECT AFTER THE PROCEDURE After the procedure, it is typical to experience:  Sleepiness.  Nausea and vomiting. HOME CARE INSTRUCTIONS  For the first 24 hours after general anesthesia:  Have a responsible person with you.  Do not drive a car. If you are alone, do not take public transportation.  Do not drink alcohol.  Do not take medicine that has not been prescribed by your health care provider.  Do not sign important papers or make important decisions.  You may resume a normal diet and activities as directed by your health care provider.  Change bandages (dressings) as directed.  If you have questions or problems that seem related to general anesthesia, call the hospital and ask for the anesthetist or anesthesiologist on call. SEEK MEDICAL CARE IF:  You have nausea and vomiting that continue the day after anesthesia.  You develop a rash. SEEK IMMEDIATE MEDICAL CARE IF:   You have difficulty breathing.  You have chest pain.  You have any allergic problems. Document Released: 01/15/2001 Document Revised: 10/14/2013 Document Reviewed: 04/24/2013 [facility] Patient Information 2015 [place], [place]. This information is not intended to replace  advice given to you by your health care provider. Make sure you discuss any questions you have with your health care provider.

## 2015-03-03 NOTE — Anesthesia Preprocedure Evaluation (Addendum)
Anesthesia Evaluation  Patient identified by MRN, date of birth, ID band Patient awake    Reviewed: Allergy & Precautions, NPO status , Patient's Chart, lab work & pertinent test results  History of Anesthesia Complications (+) PONV and history of anesthetic complications  Airway Mallampati: II  TM Distance: >3 FB Neck ROM: full    Dental  (+) Dental Advisory Given, Teeth Intact   Pulmonary shortness of breath, asthma , sleep apnea , former smoker,  breath sounds clear to auscultation        Cardiovascular hypertension, Pt. on medications + CAD Rhythm:regular Rate:Normal     Neuro/Psych  Neuromuscular disease    GI/Hepatic GERD-  Medicated,  Endo/Other    Renal/GU      Musculoskeletal  (+) Arthritis -,   Abdominal   Peds  Hematology   Anesthesia Other Findings   Reproductive/Obstetrics                           Anesthesia Physical Anesthesia Plan  ASA: III  Anesthesia Plan: General   Post-op Pain Management:    Induction: Intravenous  Airway Management Planned: Oral ETT  Additional Equipment:   Intra-op Plan:   Post-operative Plan: Extubation in OR  Informed Consent: I have reviewed the patients History and Physical, chart, labs and discussed the procedure including the risks, benefits and alternatives for the proposed anesthesia with the patient or authorized representative who has indicated his/her understanding and acceptance.   Dental advisory given  Plan Discussed with: CRNA, Anesthesiologist and Surgeon  Anesthesia Plan Comments:         Anesthesia Quick Evaluation

## 2015-03-04 ENCOUNTER — Ambulatory Visit: Payer: 59

## 2015-03-04 ENCOUNTER — Ambulatory Visit: Payer: 59 | Admitting: Hematology & Oncology

## 2015-03-04 ENCOUNTER — Other Ambulatory Visit: Payer: 59

## 2015-03-04 ENCOUNTER — Encounter (HOSPITAL_COMMUNITY): Payer: Self-pay | Admitting: General Practice

## 2015-03-04 LAB — MRSA PCR SCREENING: MRSA by PCR: NEGATIVE

## 2015-03-04 MED ORDER — NAPROXEN SODIUM 220 MG PO TABS: 220.0000 mg | ORAL_TABLET | Freq: Two times a day (BID) | ORAL | Status: DC | PRN

## 2015-03-04 MED ORDER — ALBUTEROL SULFATE (2.5 MG/3ML) 0.083% IN NEBU
2.5000 mg | INHALATION_SOLUTION | RESPIRATORY_TRACT | Status: DC | PRN
  Administered 2015-03-06 – 2015-03-10 (×3): 2.5 mg via RESPIRATORY_TRACT
  Filled 2015-03-04 (×3): qty 3

## 2015-03-04 MED ORDER — ADULT MULTIVITAMIN W/MINERALS CH
1.0000 | ORAL_TABLET | Freq: Every day | ORAL | Status: DC
  Administered 2015-03-04 – 2015-03-22 (×19): 1 via ORAL
  Filled 2015-03-04 (×21): qty 1

## 2015-03-04 MED ORDER — CETYLPYRIDINIUM CHLORIDE 0.05 % MT LIQD
7.0000 mL | Freq: Two times a day (BID) | OROMUCOSAL | Status: DC
  Administered 2015-03-04 – 2015-03-22 (×29): 7 mL via OROMUCOSAL

## 2015-03-04 MED ORDER — DOCUSATE SODIUM 100 MG PO CAPS
100.0000 mg | ORAL_CAPSULE | Freq: Two times a day (BID) | ORAL | Status: DC | PRN
  Administered 2015-03-05 – 2015-03-14 (×7): 100 mg via ORAL
  Filled 2015-03-04 (×10): qty 1

## 2015-03-04 MED ORDER — FENTANYL 50 MCG/HR TD PT72
50.0000 ug | MEDICATED_PATCH | TRANSDERMAL | Status: DC
  Administered 2015-03-04 – 2015-03-22 (×7): 50 ug via TRANSDERMAL
  Filled 2015-03-04: qty 2
  Filled 2015-03-04 (×3): qty 1
  Filled 2015-03-04 (×3): qty 2

## 2015-03-04 MED ORDER — SODIUM CHLORIDE 0.9 % IV SOLN
INTRAVENOUS | Status: DC
  Administered 2015-03-04 (×2): via INTRAVENOUS

## 2015-03-04 MED ORDER — ALPRAZOLAM 0.5 MG PO TABS
1.0000 mg | ORAL_TABLET | Freq: Every evening | ORAL | Status: DC | PRN
  Administered 2015-03-04 – 2015-03-21 (×11): 1 mg via ORAL
  Filled 2015-03-04 (×12): qty 2

## 2015-03-04 MED ORDER — DOXYCYCLINE HYCLATE 100 MG PO TABS
100.0000 mg | ORAL_TABLET | Freq: Two times a day (BID) | ORAL | Status: DC
Start: 2015-03-04 — End: 2015-03-05
  Administered 2015-03-04 – 2015-03-05 (×3): 100 mg via ORAL
  Filled 2015-03-04 (×5): qty 1

## 2015-03-04 MED ORDER — BACID PO TABS
2.0000 | ORAL_TABLET | Freq: Two times a day (BID) | ORAL | Status: DC
  Filled 2015-03-04 (×2): qty 2

## 2015-03-04 MED ORDER — METHYLPHENIDATE HCL 5 MG PO TABS
10.0000 mg | ORAL_TABLET | Freq: Two times a day (BID) | ORAL | Status: DC
  Administered 2015-03-05 – 2015-03-22 (×32): 10 mg via ORAL
  Filled 2015-03-04 (×36): qty 2

## 2015-03-04 MED ORDER — PHENOL 1.4 % MT LIQD
2.0000 | Freq: Three times a day (TID) | OROMUCOSAL | Status: DC | PRN
  Administered 2015-03-06: 2 via OROMUCOSAL
  Filled 2015-03-04: qty 177

## 2015-03-04 MED ORDER — ERGOCALCIFEROL 1.25 MG (50000 UT) PO CAPS: 50000.0000 [IU] | ORAL_CAPSULE | ORAL | Status: DC

## 2015-03-04 MED ORDER — DRONABINOL 5 MG PO CAPS
10.0000 mg | ORAL_CAPSULE | Freq: Three times a day (TID) | ORAL | Status: DC
  Administered 2015-03-04 – 2015-03-12 (×25): 10 mg via ORAL
  Filled 2015-03-04 (×14): qty 4
  Filled 2015-03-04: qty 2
  Filled 2015-03-04 (×11): qty 4

## 2015-03-04 MED ORDER — TRAMADOL HCL 50 MG PO TABS
50.0000 mg | ORAL_TABLET | Freq: Four times a day (QID) | ORAL | Status: DC | PRN
  Administered 2015-03-04: 100 mg via ORAL
  Administered 2015-03-05: 50 mg via ORAL
  Administered 2015-03-06 – 2015-03-07 (×2): 100 mg via ORAL
  Administered 2015-03-07: 50 mg via ORAL
  Administered 2015-03-07 – 2015-03-08 (×2): 100 mg via ORAL
  Administered 2015-03-08: 50 mg via ORAL
  Administered 2015-03-08 – 2015-03-17 (×12): 100 mg via ORAL
  Filled 2015-03-04 (×2): qty 2
  Filled 2015-03-04: qty 1
  Filled 2015-03-04 (×4): qty 2
  Filled 2015-03-04: qty 1
  Filled 2015-03-04 (×6): qty 2
  Filled 2015-03-04: qty 1
  Filled 2015-03-04 (×5): qty 2

## 2015-03-04 MED ORDER — CALCIUM CARBONATE ANTACID 500 MG PO CHEW
2.0000 | CHEWABLE_TABLET | Freq: Two times a day (BID) | ORAL | Status: DC
  Administered 2015-03-04 – 2015-03-22 (×37): 400 mg via ORAL
  Filled 2015-03-04 (×40): qty 2

## 2015-03-04 MED ORDER — DIPHENHYDRAMINE HCL 50 MG/ML IJ SOLN
25.0000 mg | Freq: Four times a day (QID) | INTRAMUSCULAR | Status: DC | PRN
  Administered 2015-03-12 – 2015-03-21 (×9): 25 mg via INTRAVENOUS
  Filled 2015-03-04 (×9): qty 1

## 2015-03-04 MED ORDER — ALBUTEROL SULFATE HFA 108 (90 BASE) MCG/ACT IN AERS: 2.0000 | INHALATION_SPRAY | RESPIRATORY_TRACT | Status: DC | PRN

## 2015-03-04 MED ORDER — OLANZAPINE 5 MG PO TABS
5.0000 mg | ORAL_TABLET | Freq: Every day | ORAL | Status: DC
  Administered 2015-03-04 – 2015-03-21 (×18): 5 mg via ORAL
  Filled 2015-03-04 (×24): qty 1

## 2015-03-04 MED ORDER — TRAZODONE HCL 100 MG PO TABS
100.0000 mg | ORAL_TABLET | Freq: Every evening | ORAL | Status: DC | PRN
  Administered 2015-03-04: 200 mg via ORAL
  Administered 2015-03-05 – 2015-03-21 (×9): 100 mg via ORAL
  Filled 2015-03-04 (×7): qty 2
  Filled 2015-03-04: qty 1
  Filled 2015-03-04 (×3): qty 2
  Filled 2015-03-04: qty 1

## 2015-03-04 MED ORDER — BISMUTH SUBSALICYLATE 262 MG PO CHEW
262.0000 mg | CHEWABLE_TABLET | ORAL | Status: DC | PRN
  Filled 2015-03-04: qty 1

## 2015-03-04 MED ORDER — ONE-DAILY MULTI VITAMINS PO TABS: 1.0000 | ORAL_TABLET | Freq: Every day | ORAL | Status: DC

## 2015-03-04 MED ORDER — DOCUSATE SODIUM 283 MG RE ENEM
1.0000 | ENEMA | Freq: Every day | RECTAL | Status: DC | PRN
  Filled 2015-03-04: qty 1

## 2015-03-04 MED ORDER — PANTOPRAZOLE SODIUM 40 MG PO TBEC
40.0000 mg | DELAYED_RELEASE_TABLET | Freq: Every day | ORAL | Status: DC
  Administered 2015-03-04 – 2015-03-22 (×18): 40 mg via ORAL
  Filled 2015-03-04 (×20): qty 1

## 2015-03-04 MED ORDER — VITAMIN D (ERGOCALCIFEROL) 1.25 MG (50000 UNIT) PO CAPS
50000.0000 [IU] | ORAL_CAPSULE | ORAL | Status: DC
  Administered 2015-03-04 – 2015-03-18 (×3): 50000 [IU] via ORAL
  Filled 2015-03-04 (×3): qty 1

## 2015-03-04 MED ORDER — HYDROMORPHONE HCL 1 MG/ML IJ SOLN
1.0000 mg | INTRAMUSCULAR | Status: DC | PRN
  Administered 2015-03-04 – 2015-03-21 (×12): 1 mg via INTRAVENOUS
  Filled 2015-03-04 (×11): qty 1

## 2015-03-04 MED ORDER — RISAQUAD PO CAPS
2.0000 | ORAL_CAPSULE | Freq: Two times a day (BID) | ORAL | Status: DC
  Administered 2015-03-04 – 2015-03-21 (×37): 2 via ORAL
  Filled 2015-03-04 (×38): qty 2

## 2015-03-04 MED ORDER — METOCLOPRAMIDE HCL 10 MG PO TABS
10.0000 mg | ORAL_TABLET | Freq: Four times a day (QID) | ORAL | Status: DC | PRN
  Administered 2015-03-20 – 2015-03-22 (×5): 10 mg via ORAL
  Filled 2015-03-04 (×7): qty 1

## 2015-03-04 NOTE — Progress Notes (Signed)
Utilization Review Completed.Julio Storr T5/09/2015  

## 2015-03-04 NOTE — Progress Notes (Addendum)
Subjective: POD#1 from revision bilateral sinus surgery, Draf 3 frontal sinusotomy, and removal of left fronto-ethmoid mucocele. Admitted for pain and sat's in the low 90's post-op. Patient complains of pain this morning, sat's improved.  Objective: Vital signs in last 24 hours: Temp:  [97.3 F (36.3 C)-98.9 F (37.2 C)] 97.3 F (36.3 C) (05/12 0300) Pulse Rate:  [84-106] 87 (05/12 0600) Resp:  [7-29] 17 (05/12 0600) BP: (100-140)/(61-85) 126/71 mmHg (05/12 0600) SpO2:  [89 %-100 %] 94 % (05/12 0600) FiO2 (%):  [28 %-35 %] 35 % (05/12 0400) Weight:  [75.297 kg (166 lb)] 75.297 kg (166 lb) (05/11 1052)  EOMI, PERRLA, nose hemostatic with nasopore packing and nasal drip pad, NAD, A&Ox 3  @LABLAST2 (wbc:2,hgb:2,hct:2,plt:2) No results for input(s): NA, K, CL, CO2, GLUCOSE, BUN, CREATININE, CALCIUM in the last 72 hours.  Medications:  Scheduled Meds: . antiseptic oral rinse  7 mL Mouth Rinse BID  . oxyCODONE      . scopolamine  1 patch Transdermal Q72H   Continuous Infusions: . sodium chloride 100 mL/hr at 03/04/15 0551  . lactated ringers 10 mL/hr at 03/03/15 1148   PRN Meds:.diphenhydrAMINE, HYDROmorphone (DILAUDID) injection, ibuprofen, ondansetron (ZOFRAN) IV, oxyCODONE, phenol  Assessment/Plan: POD#1 from revision sinus surgery, Draf 3 frontal sinusotomy, and removal left fronto-ethmoid mucocele. Will transfer to [place] and then patient can go home when she feels her pain is controlled with PO medications and oxygen saturations are stable.   LOS: 1 day   [name] 03/04/2015, 6:06 AM   Checked on patient 1730. Sat's around 92% on room air, still complains of expected nasal soreness/congestion. Wants to stay at least another day  [name], MD  5:45 PM  03/04/2015

## 2015-03-04 NOTE — Addendum Note (Signed)
Addendum  created 03/04/15 2030 by Lillia Abed, MD   Modules edited: Anesthesia Events, Narrator   Narrator:  Narrator: Event Log Edited

## 2015-03-04 NOTE — Progress Notes (Signed)
Received into 6N at 1200, oriented to unit.

## 2015-03-05 ENCOUNTER — Inpatient Hospital Stay (HOSPITAL_COMMUNITY): Payer: 59

## 2015-03-05 DIAGNOSIS — R5082 Postprocedural fever: Secondary | ICD-10-CM

## 2015-03-05 DIAGNOSIS — D696 Thrombocytopenia, unspecified: Secondary | ICD-10-CM

## 2015-03-05 DIAGNOSIS — C50919 Malignant neoplasm of unspecified site of unspecified female breast: Secondary | ICD-10-CM

## 2015-03-05 DIAGNOSIS — J9601 Acute respiratory failure with hypoxia: Secondary | ICD-10-CM

## 2015-03-05 LAB — CBC
HCT: 30.2 % — ABNORMAL LOW (ref 36.0–46.0)
HEMOGLOBIN: 9.9 g/dL — AB (ref 12.0–15.0)
MCH: 28.1 pg (ref 26.0–34.0)
MCHC: 32.8 g/dL (ref 30.0–36.0)
MCV: 85.8 fL (ref 78.0–100.0)
Platelets: 102 10*3/uL — ABNORMAL LOW (ref 150–400)
RBC: 3.52 MIL/uL — ABNORMAL LOW (ref 3.87–5.11)
RDW: 18.6 % — ABNORMAL HIGH (ref 11.5–15.5)
WBC: 8.7 10*3/uL (ref 4.0–10.5)

## 2015-03-05 LAB — COMPREHENSIVE METABOLIC PANEL
ALT: 51 U/L (ref 14–54)
ANION GAP: 10 (ref 5–15)
AST: 60 U/L — ABNORMAL HIGH (ref 15–41)
Albumin: 2.8 g/dL — ABNORMAL LOW (ref 3.5–5.0)
Alkaline Phosphatase: 278 U/L — ABNORMAL HIGH (ref 38–126)
BILIRUBIN TOTAL: 0.7 mg/dL (ref 0.3–1.2)
CALCIUM: 8.7 mg/dL — AB (ref 8.9–10.3)
CO2: 27 mmol/L (ref 22–32)
Chloride: 99 mmol/L — ABNORMAL LOW (ref 101–111)
Creatinine, Ser: 0.85 mg/dL (ref 0.44–1.00)
GFR calc non Af Amer: >60 mL/min (ref >60)
Glucose, Bld: 130 mg/dL — ABNORMAL HIGH (ref 65–99)
Potassium: 3.2 mmol/L — ABNORMAL LOW (ref 3.5–5.1)
SODIUM: 136 mmol/L (ref 135–145)
TOTAL PROTEIN: 6.4 g/dL — AB (ref 6.5–8.1)

## 2015-03-05 LAB — URINALYSIS, ROUTINE W REFLEX MICROSCOPIC
Bilirubin Urine: NEGATIVE
GLUCOSE, UA: NEGATIVE mg/dL
Hgb urine dipstick: NEGATIVE
KETONES UR: NEGATIVE mg/dL
Leukocytes, UA: NEGATIVE
Nitrite: NEGATIVE
PROTEIN: NEGATIVE mg/dL
SPECIFIC GRAVITY, URINE: 1.009 (ref 1.005–1.030)
Urobilinogen, UA: 0.2 mg/dL (ref 0.0–1.0)
pH: 5.5 (ref 5.0–8.0)

## 2015-03-05 LAB — LACTIC ACID, PLASMA
Lactic Acid, Venous: 2.4 mmol/L (ref 0.5–2.0)
Lactic Acid, Venous: 2 mmol/L (ref 0.5–2.0)

## 2015-03-05 LAB — BRAIN NATRIURETIC PEPTIDE: B Natriuretic Peptide: 48.8 pg/mL (ref 0.0–100.0)

## 2015-03-05 LAB — PROCALCITONIN: Procalcitonin: <0.1 ng/mL

## 2015-03-05 MED ORDER — VANCOMYCIN HCL 10 G IV SOLR
1250.0000 mg | Freq: Once | INTRAVENOUS | Status: AC
  Administered 2015-03-05: 1250 mg via INTRAVENOUS
  Filled 2015-03-05: qty 1250

## 2015-03-05 MED ORDER — VANCOMYCIN HCL IN DEXTROSE 750-5 MG/150ML-% IV SOLN
750.0000 mg | Freq: Three times a day (TID) | INTRAVENOUS | Status: DC
  Administered 2015-03-06 – 2015-03-13 (×22): 750 mg via INTRAVENOUS
  Filled 2015-03-05 (×25): qty 150

## 2015-03-05 MED ORDER — PIPERACILLIN-TAZOBACTAM 3.375 G IVPB 30 MIN: 3.3750 g | Freq: Three times a day (TID) | INTRAVENOUS | Status: DC

## 2015-03-05 MED ORDER — ACETAMINOPHEN 325 MG PO TABS
650.0000 mg | ORAL_TABLET | Freq: Four times a day (QID) | ORAL | Status: DC | PRN
  Administered 2015-03-05 – 2015-03-08 (×6): 650 mg via ORAL
  Filled 2015-03-05 (×6): qty 2

## 2015-03-05 MED ORDER — SODIUM CHLORIDE 0.9 % IJ SOLN
10.0000 mL | INTRAMUSCULAR | Status: DC | PRN
  Administered 2015-03-07 – 2015-03-18 (×5): 10 mL
  Administered 2015-03-20: 20 mL
  Administered 2015-03-21 – 2015-03-22 (×3): 10 mL
  Filled 2015-03-05 (×9): qty 40

## 2015-03-05 MED ORDER — DOXYCYCLINE HYCLATE 100 MG PO TABS: 100.0000 mg | ORAL_TABLET | Freq: Two times a day (BID) | ORAL | Status: DC

## 2015-03-05 MED ORDER — SODIUM CHLORIDE 0.9 % IV SOLN
INTRAVENOUS | Status: DC
  Administered 2015-03-05 – 2015-03-09 (×6): via INTRAVENOUS
  Filled 2015-03-05 (×12): qty 1000

## 2015-03-05 MED ORDER — SODIUM CHLORIDE 0.9 % IV BOLUS (SEPSIS)
1000.0000 mL | Freq: Once | INTRAVENOUS | Status: AC
  Administered 2015-03-05: 1000 mL via INTRAVENOUS

## 2015-03-05 MED ORDER — PIPERACILLIN-TAZOBACTAM 3.375 G IVPB
3.3750 g | Freq: Three times a day (TID) | INTRAVENOUS | Status: DC
  Administered 2015-03-05 – 2015-03-13 (×23): 3.375 g via INTRAVENOUS
  Filled 2015-03-05 (×27): qty 50

## 2015-03-05 MED ORDER — ACETAMINOPHEN 325 MG PO TABS
650.0000 mg | ORAL_TABLET | Freq: Once | ORAL | Status: AC
  Administered 2015-03-05: 650 mg via ORAL

## 2015-03-05 MED ORDER — SODIUM CHLORIDE 0.9 % IJ SOLN
10.0000 mL | Freq: Two times a day (BID) | INTRAMUSCULAR | Status: DC
  Administered 2015-03-06 – 2015-03-19 (×16): 10 mL
  Administered 2015-03-20: 20 mL
  Administered 2015-03-22: 30 mL

## 2015-03-05 MED ORDER — SODIUM CHLORIDE 0.9 % IV BOLUS (SEPSIS)
1000.0000 mL | Freq: Once | INTRAVENOUS | Status: AC
Start: 2015-03-05 — End: 2015-03-06
  Administered 2015-03-06: 1000 mL via INTRAVENOUS

## 2015-03-05 NOTE — Progress Notes (Signed)
ANTIBIOTIC CONSULT NOTE - FOLLOW UP  Pharmacy Consult for vancomycin Indication: rule out sepsis  Allergies  Allergen Reactions  . Iohexol      Code: RASH, Desc: VERY STRONG FAMILY HX OF ANGIOEDEMA WHEN RECEIVING IV CONTRAST; PT HAS BEEN PREMEDICATED FOR OTHER CONTRASTED STUDIES(IN CATH. LAB)  KR, Onset Date: [id number]   . Prednisone Itching    Capillary beds bust  . Tetanus Toxoids Other (See Comments)    Ran a high fever for 48 hours  . Theophyllines Hives    Mental changes  . Versed [Midazolam] Other (See Comments)    Pt becomes violent    Patient Measurements: Height: 5\' 7"  (170.2 cm) Weight: 166 lb (75.297 kg) IBW/kg (Calculated) : 61.6 Vital Signs: Temp: 102 F (38.9 C) (05/13 1309) Temp Source: Axillary (05/13 1309) BP: 121/67 mmHg (05/13 1309) Pulse Rate: 110 (05/13 1309) Intake/Output from previous day: 05/12 0701 - 05/13 0700 In: 1500 [P.O.:400; I.V.:1100] Out: 400 [Urine:400] Intake/Output from this shift:    Labs:  Recent Labs  03/05/15 1500  WBC 8.7  HGB 9.9*  PLT PENDING   Estimated Creatinine Clearance: 90.1 mL/min (by C-G formula based on Cr of 0.8). No results for input(s): VANCOTROUGH, VANCOPEAK, VANCORANDOM, GENTTROUGH, GENTPEAK, GENTRANDOM, TOBRATROUGH, TOBRAPEAK, TOBRARND, AMIKACINPEAK, AMIKACINTROU, AMIKACIN in the last 72 hours.   Microbiology: Recent Results (from the past 720 hour(s))  Culture, routine-sinus     Status: None (Preliminary result)   Collection Time: 03/03/15  3:58 PM  Result Value Ref Range Status   Specimen Description NASAL SWAB  Final   Special Requests NONE  Final   Culture   Final    Culture reincubated for better growth Performed at [facility]    Report Status PENDING  Incomplete  MRSA PCR Screening     Status: None   Collection Time: 03/03/15 10:24 PM  Result Value Ref Range Status   MRSA by PCR NEGATIVE NEGATIVE Final    Comment:        The GeneXpert MRSA Assay (FDA approved for NASAL  specimens only), is one component of a comprehensive MRSA colonization surveillance program. It is not intended to diagnose MRSA infection nor to guide or monitor treatment for MRSA infections.     Anti-infectives    Start     Dose/Rate Route Frequency Ordered Stop   03/05/15 1545  piperacillin-tazobactam (ZOSYN) IVPB 3.375 g  Status:  Discontinued     3.375 g 100 mL/hr over 30 Minutes Intravenous 3 times per day 03/05/15 1532 03/05/15 1532   03/05/15 1545  piperacillin-tazobactam (ZOSYN) IVPB 3.375 g  Status:  Discontinued     3.375 g 100 mL/hr over 30 Minutes Intravenous 3 times per day 03/05/15 1532 03/05/15 1534   03/05/15 1545  piperacillin-tazobactam (ZOSYN) IVPB 3.375 g     3.375 g 12.5 mL/hr over 240 Minutes Intravenous Every 8 hours 03/05/15 1534     03/05/15 0000  doxycycline (VIBRA-TABS) 100 MG tablet     100 mg Oral Every 12 hours 03/05/15 1102     03/04/15 1000  doxycycline (VIBRA-TABS) tablet 100 mg     100 mg Oral Every 12 hours 03/04/15 0612     03/03/15 1215  clindamycin (CLEOCIN) IVPB 600 mg     600 mg 100 mL/hr over 30 Minutes Intravenous  Once 03/03/15 1201 03/03/15 1540      Assessment: 49 year old female on day # 1 of Zosyn and now to add vancomycin fro r/o sepsis with presumed pulmonary versus  CNS/sinus source. 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 She is POD#2 of revision of bilateral sinuses.   Tmax is 102. Last wbc pre-op was 5.2. No repeat CBC available. Blood, urine, and sinus cultures pending.   Goal of Therapy:  Vancomycin trough level 15-20 mcg/ml  Plan:  Vancomycin 1250 mg IV x1, then 750mg  IV q8h.  Monitor renal function, clinical status, and culture results.  Vancomcyin trough at steady state as appropriate.   Sloan Leiter, PharmD, BCPS Clinical Pharmacist [phone] 03/05/2015,3:41 PM

## 2015-03-05 NOTE — Consult Note (Signed)
Medical Consultation  Bianca Kent RDE:[id number] DOB: 1965/11/20 DOA: 03/03/2015 PCP: [name], MD   Requesting physician: Dr. [name] Date of consultation: 03/05/2015 Reason for consultation: Fever and hypoxemia  Impression/Recommendations Fever/hypoxemia/acute respiratory failure  -concerned about HCAP -Obtain chest x-ray -Blood cultures 2 sets -UA and urine culture -Lactic acid -Procalcitonin -Continue supplemental oxygen -After blood cultures are obtained, we'll start the patient on empiric antibiotics as the patient meets SIRS criteria -The patient's fever persists may need to consider infection related to the patient's sinuses and CNS -Check the CBC and CMP Metastatic breast cancer to the brain -Patient received partial dose chemotherapy 02/25/2015 -Dr. [name] is Med Oncologist Thrombocytopenia -Likely due to chemotherapy Anxiety/depression  -Continue alprazolam, Zyprexa GERD  -Continue PPI    I will followup again tomorrow. Please contact me if I can be of assistance in the meanwhile. Thank you for this consultation.  Chief Complaint:  cough, fever HPI:  49 year old female with a history of metastatic breast cancer to the brain (Dr. [name]), asthma, depression, GERD, was admitted on 03/03/2015 for elective outpatient sinus surgery. The patient underwent a left frontal ethmoid mucocele drainage performed by Dr. [name]. During the surgery, the patient also had her anterior Reform CSF leak from the mucocele repaired with cautery. Postoperatively, the patient has some hypoxemia, and therefore she was kept overnight. On postoperative day #2, the patient's oxygen saturations have somewhat improved and the patient was about to be discharged home. Unfortunately, the patient began developing fever and subsequently was found to have hypoxemia with oxygen saturations in the mid 70s on room air. Remarkably, the patient is not that short of breath. She is  not in any respiratory distress. She denies any chest pain, headache, nausea, vomiting, diarrhea, abdominal pain, dysuria, hematuria. She has a nonproductive cough. The patient has been started on prophylactic doxycycline since the surgery. In addition, the patient was given normal saline although it is unclear exactly the amount that she received.on 03/05/2015, the patient spiked a temperature of 102.63F with some mild tachycardia. She remains hemodynamically stable. The patient was placed on a "facial tent" oxygen with improvement of her oxygenation up to 92%. The patient presently has nasal packing from her surgery and is draining Kool-Aid colored drainage without pus. In addition, the patient last received full dose chemotherapy on 01/07/2015. She did have a partial dose of chemotherapy on 02/25/2015. She previously received radiation to her left temporal area back in February 2016.  Review of Systems:  Constitutional:  No weight loss, night sweats Head&Eyes: No headache.  No vision loss.  No eye pain or scotoma ENT:  No Difficulty swallowing,Tooth/dental problems,Sore throat,  No ear ache, post nasal drip,  Cardio-vascular:  No chest pain, Orthopnea, PND, swelling in lower extremities,  dizziness, palpitations  GI:  No heartburn, indigestion, abdominal pain, nausea, vomiting, diarrhea, loss of appetite, hematochezia, melena Resp:  No shortness of breath with exertion or at rest. No excess mucus, no productive cough,  No coughing up of blood.No change in color of mucus.No wheezing.No chest wall deformity  Skin:  no rash or lesions.  GU:  no dysuria, change in color of urine, no urgency or frequency. No flank pain.  Musculoskeletal:  No joint pain or swelling. No decreased range of motion. No back pain.  Psych:  No change in mood or affect. Neurologic: No headache, no dysesthesia, no focal weakness, no vision loss. No syncope   Past Medical History  Diagnosis Date  . Allergy   .  Asthma   . Coronary artery spasm   . Sleep apnea     Does not use machine patient stated "its not bad enough for that"  . Pneumonia     hx of  . UTI (lower urinary tract infection)     Due to small ureters  . Hypertension     pt reports that while taking medication for coronary spasms caused elevated blood pressure. Pt no longer takes meds for coronary spasms or HTN.  . Radiation 08/06/14-09/16/14    right breast, axillary, supraclavicular region   . Cancer     right breast & lymph nodes  . Metastasis from breast cancer 11/26/14    MRI Brain  . Breast cancer 02/2014    right  . History of radiation therapy 12/16/14    Left temporal tumor, SRS treatment  . PONV (postoperative nausea and vomiting)   . Family history of anesthesia complication     Mother and sisters has angioedema  . Shortness of breath dyspnea     "side effect of Chemo"  . GERD (gastroesophageal reflux disease)   . Arthritis     Osteo arthritis   Past Surgical History  Procedure Laterality Date  . Cholecystectomy    . Knee surgery Right     arthroscopy- cleaned menicus  . Tendon repair Left     index finger  . Tonsillectomy    . Sinus surgery with instatrak      without instatrak  . Tubal ligation    . Colonoscopy w/ polypectomy  2008  . Wisdom tooth extraction    . Cardiac catheterization  X 3    no PCI  . Portacath placement Left 03/26/2014    Procedure: INSERTION PORT-A-CATH;  Surgeon: Rolm Bookbinder, MD;  Location: [facility];  Service: General;  Laterality: Left;  . Breast biopsy with sentinel lymph node biopsy and needle localization    . Sinusotomy  03/03/2015  . Sinus endo w/fusion Bilateral 03/03/2015    Procedure: ENDOSCOPIC SINUS SURGERY WITH NAVIGATION;  Surgeon: [name], MD;  Location: [facility] OR;  Service: ENT;  Laterality: Bilateral;   Social History:  reports that she quit smoking about 7 years ago. Her smoking use included Cigarettes. She started smoking about 30 years ago. She has a 12  pack-year smoking history. She has never used smokeless tobacco. She reports that she drinks alcohol. She reports that she does not use illicit drugs.  Family History  Problem Relation Age of Onset  . Coronary artery disease Mother   . Diabetes Mother   . Hypertension Mother   . Coronary artery disease Father   . Hypertension Father   . Hypertension Sister   . Coronary artery disease Maternal Grandmother   . Stroke Maternal Grandmother   . Depression Maternal Grandmother   . Cancer Maternal Grandmother     colon  . Coronary artery disease Maternal Grandfather   . Stroke Maternal Grandfather   . Depression Maternal Grandfather   . Coronary artery disease Paternal Grandmother   . Cancer Paternal Grandmother   . Cancer Paternal Grandfather     lung  . Hypertension Sister   . Cancer Paternal Aunt     breast    Allergies  Allergen Reactions  . Iohexol      Code: RASH, Desc: VERY STRONG FAMILY HX OF ANGIOEDEMA WHEN RECEIVING IV CONTRAST; PT HAS BEEN PREMEDICATED FOR OTHER CONTRASTED STUDIES(IN CATH. LAB)  KR, Onset Date: [id number]   . Prednisone Itching    Capillary  beds bust  . Tetanus Toxoids Other (See Comments)    Ran a high fever for 48 hours  . Theophyllines Hives    Mental changes  . Versed [Midazolam] Other (See Comments)    Pt becomes violent     Prior to Admission medications   Medication Sig Start Date End Date Taking? Authorizing Provider  ALPRAZolam Duanne Moron) 1 MG tablet Take 1 tablet (1 mg total) by mouth at bedtime as needed for anxiety. 10/29/14  Yes [name], MD  aspirin (ASPIRIN EC) 81 MG EC tablet Take 81 mg by mouth daily. Swallow whole.   Yes Historical Provider, MD  Bismuth Subsalicylate (PEPTO-BISMOL PO) Take 1 tablet by mouth as needed (upset stomach).    Yes Historical Provider, MD  calcium carbonate (TUMS - DOSED IN MG ELEMENTAL CALCIUM) 500 MG chewable tablet Chew 2 tablets by mouth 2 (two) times daily.   Yes Historical Provider, MD    diphenhydrAMINE (SOMINEX) 25 MG tablet Take 100 mg by mouth at bedtime as needed for sleep. Pt states she is taking up to 200 mg Benadryl at night and still not sleeping   Yes Historical Provider, MD  dronabinol (MARINOL) 10 MG capsule Take 1 capsule (10 mg total) by mouth 3 (three) times daily before meals. 10/29/14  Yes [name], MD  ergocalciferol (VITAMIN D2) 50000 UNITS capsule Take 1 capsule (50,000 Units total) by mouth once a week. 11/19/14  Yes [name], MD  fentaNYL (DURAGESIC) 50 MCG/HR Place 1 patch (50 mcg total) onto the skin every 3 (three) days. 08/06/14  Yes [name], MD  HYDROmorphone (DILAUDID) 4 MG tablet Take 1/2 - 1 pill, IF NEEDED, for pain every 4 hrs 12/17/14  Yes [name], MD  lidocaine (XYLOCAINE) 2 % solution Use as directed 20 mLs in the mouth or throat as needed for mouth pain. 11/19/14  Yes [name], MD  lidocaine-prilocaine (EMLA) cream Apply 1 application topically as needed. Placeonto port site 34min prior to treatment and cover with plastic wrap. 10/29/14  Yes [name], MD  loratadine-pseudoephedrine (CLARITIN-D 24-HOUR) 10-240 MG per 24 hr tablet Take 1 tablet by mouth daily.   Yes Historical Provider, MD  LORazepam (ATIVAN) 1 MG tablet Place 1 tablet under the tongue, IF NEEDED, every 6 hours for nausea. 12/17/14  Yes [name], MD  methylphenidate (RITALIN) 10 MG tablet Take 1 tablet (10 mg total) by mouth 2 (two) times daily. 10/08/14  Yes [name], MD  metoCLOPramide (REGLAN) 10 MG tablet Take 1 tablet (10 mg total) by mouth every 6 (six) hours as needed for nausea. 10/29/14  Yes [name], MD  Multiple Vitamin (MULTIVITAMIN) tablet Take 1 tablet by mouth daily.   Yes Historical Provider, MD  naproxen sodium (ANAPROX) 220 MG tablet Take 220 mg by mouth 2 (two) times daily with a meal.   Yes Historical Provider, MD  NONFORMULARY OR COMPOUNDED ITEM Take 5 mLs by mouth as needed (irritation). DUKES MOUTHWASH  SWISH  AND SWALLOW   Yes Historical Provider, MD  OLANZapine (ZYPREXA) 5 MG tablet Take 1 tablet (5 mg total) by mouth at bedtime. 12/17/14  Yes [name], MD  ondansetron (ZOFRAN) 8 MG tablet TAKE 1 TABLET BY MOUTH EVERY 8 HOURS AS NEEDED FOR NAUSEA OR VOMITING 12/03/14  Yes [name], MD  pantoprazole (PROTONIX) 40 MG tablet Take 1 tablet (40 mg total) by mouth daily. 06/04/14  Yes [name],  MD  prochlorperazine (COMPAZINE) 10 MG tablet Take 1 tablet (10 mg total) by mouth every 6 (six) hours as needed for nausea or vomiting. 06/04/14  Yes [name], MD  promethazine (PHENERGAN) 25 MG suppository Place 1 suppository (25 mg total) rectally every 6 (six) hours as needed for nausea or vomiting. 07/16/14  Yes [name], MD  traMADol (ULTRAM) 50 MG tablet Take 1-2 tablets (50-100 mg total) by mouth every 6 (six) hours as needed. Patient taking differently: Take 50-100 mg by mouth every 6 (six) hours as needed (pain).  03/02/15  Yes [name], MD  traZODone (DESYREL) 100 MG tablet Take 1-2 if needed at bed for sleep. Patient taking differently: Take 100-200 mg by mouth at bedtime as needed for sleep. Take 1-2 if needed at bed for sleep. 10/08/14  Yes [name], MD  albuterol (PROVENTIL HFA;VENTOLIN HFA) 108 (90 BASE) MCG/ACT inhaler Inhale 2 puffs into the lungs every 4 (four) hours as needed for wheezing or shortness of breath. Patient not taking: Reported on 02/25/2015 05/14/14   [name], MD  doxycycline (VIBRA-TABS) 100 MG tablet Take 1 tablet (100 mg total) by mouth every 12 (twelve) hours. 03/05/15   [name], MD  Lactulose SOLN Take 2 TBLSP every 6 hours until bowel movement Patient not taking: Reported on 02/25/2015 12/17/14   [name], MD  pyridoxine (B-6) 250 MG tablet Take 1 tablet (250 mg total) by mouth daily. Patient not taking: Reported on 02/25/2015 02/12/15   Eliezer Bottom, NP    Physical Exam: Filed Vitals:   03/04/15 1723 03/04/15 2157  03/05/15 0500 03/05/15 1309  BP: 96/56 105/64 96/65 121/67  Pulse: 89 111 112 110  Temp: 99.3 F (37.4 C) 98.8 F (37.1 C) 97.7 F (36.5 C) 102 F (38.9 C)  TempSrc: Axillary Oral Axillary Axillary  Resp: 19 18 18 19   Height:      Weight:      SpO2: 93% 92% 90% 90%   General:  A&O x 3, NAD, nontoxic, pleasant/cooperative Head/Eye: No conjunctival hemorrhage, no icterus, [place]/AT, No nystagmus ENT:  No icterus,  No thrush,no pharyngeal exudate;  mild periorbital swelling;  Neck:  No masses, no lymphadenpathy, no bruits; no meningismus CV:  RRR, no rub, no gallop, no S3 Lung:  R>L crackles, no wheeze Abdomen: soft/NT, +BS, nondistended, no peritoneal signs Ext: No cyanosis, No rashes, No petechiae, No lymphangitis, No edema  Labs on Admission:  Basic Metabolic Panel: No results for input(s): NA, K, CL, CO2, GLUCOSE, BUN, CREATININE, CALCIUM, MG, PHOS in the last 168 hours. Liver Function Tests: No results for input(s): AST, ALT, ALKPHOS, BILITOT, PROT, ALBUMIN in the last 168 hours. No results for input(s): LIPASE, AMYLASE in the last 168 hours. No results for input(s): AMMONIA in the last 168 hours. CBC: No results for input(s): WBC, NEUTROABS, HGB, HCT, MCV, PLT in the last 168 hours. Cardiac Enzymes: No results for input(s): CKTOTAL, CKMB, CKMBINDEX, TROPONINI in the last 168 hours. BNP: Invalid input(s): POCBNP CBG: No results for input(s): GLUCAP in the last 168 hours.  Radiological Exams on Admission: No results found.    Time spent: 65 min  Zonya Gudger [facility] Pager [phone]  If 7PM-7AM, please contact night-coverage [email or web address] Password [facility] 03/05/2015, 2:44 PM

## 2015-03-05 NOTE — Discharge Summary (Addendum)
03/05/2015  10:51 AM  Date of Admission: 03/03/2015 Date of Discharge: 03/05/2015  Discharge MD: [name], MD Admitting MD: [name], MD  Reason for admission/final discharge diagnosis: left fronto-ethmoid mucocele and chronic sinusitis  Labs: see EPIC  Procedure(s) performed: bilateral sinus surgery and Draf III frontal sinusotomy 03/03/2015  Discharge Condition: improved  Discharge Exam: EOMI, PERRLA, vision grossly intact, nasal cavity hemostatic with absorbable nasopore packing bilaterally. Saturations >90% on room air  Discharge Instructions: No heavy lifting or nose blowing x 2 weeks, follow up with  [facility] [name] [facility] 2 weeks post-op. Rx for hydrocodone is on chart, Rx for antibiotic and zofran sent to pharmacy.  Hospital Course: did well after revision bilateral sinus surgery but had some mildly low oxygen saturations post-op and some nasal pain so patient wanted to stay in hospital. POD #2 patient's sats were improved (has chronic lung fibrosis from radiation therapy) and patient's pain was improved so she wanted to be discharged home. Hospital course otherwise uneventful and taking excellent PO.  [name] 10:51 AM  03/05/2015   Nursing called, took vitals prior to discharge and patient was noted to have a fever to 102 F and oxygen saturations in the 70's on room air. I called the Hospitalist service and they agreed to transfer patient to their service for workup of her pulmonary issues. I canceled her discharge order. Sinus packing is all absorbable.

## 2015-03-05 NOTE — Progress Notes (Signed)
Laboratory results and chest x-ray reviewed that were ordered earlier today.  Chest x-ray shows bibasilar atelectasis with left basilar infiltrate, better seen retrocardiac. Lactic acid 2.4 -Bolused 2 L normal saline and start maintenance fluids -Replete potassium -Antibiotics vancomycin and Zosyn initiated for likely HCAP -HGB drop noted--?spurious--check FOBT CBC and BMP in am  DTat

## 2015-03-06 ENCOUNTER — Inpatient Hospital Stay (HOSPITAL_COMMUNITY): Payer: 59

## 2015-03-06 DIAGNOSIS — R5082 Postprocedural fever: Secondary | ICD-10-CM

## 2015-03-06 DIAGNOSIS — J9601 Acute respiratory failure with hypoxia: Secondary | ICD-10-CM

## 2015-03-06 DIAGNOSIS — R410 Disorientation, unspecified: Secondary | ICD-10-CM | POA: Insufficient documentation

## 2015-03-06 DIAGNOSIS — R0902 Hypoxemia: Secondary | ICD-10-CM

## 2015-03-06 LAB — BLOOD GAS, ARTERIAL
Acid-Base Excess: 1.8 mmol/L (ref 0.0–2.0)
BICARBONATE: 26.5 meq/L — AB (ref 20.0–24.0)
Drawn by: 23588
FIO2: 0.6 %
O2 SAT: 89.7 %
PATIENT TEMPERATURE: 98.6
PCO2 ART: 46.3 mmHg — AB (ref 35.0–45.0)
PH ART: 7.376 (ref 7.350–7.450)
TCO2: 27.9 mmol/L (ref 0–100)
pO2, Arterial: 61.2 mmHg — ABNORMAL LOW (ref 80.0–100.0)

## 2015-03-06 LAB — CBC
HCT: 27.7 % — ABNORMAL LOW (ref 36.0–46.0)
Hemoglobin: 8.9 g/dL — ABNORMAL LOW (ref 12.0–15.0)
MCH: 27.8 pg (ref 26.0–34.0)
MCHC: 32.1 g/dL (ref 30.0–36.0)
MCV: 86.6 fL (ref 78.0–100.0)
PLATELETS: 98 10*3/uL — AB (ref 150–400)
RBC: 3.2 MIL/uL — AB (ref 3.87–5.11)
RDW: 18.7 % — ABNORMAL HIGH (ref 11.5–15.5)
WBC: 7.2 10*3/uL (ref 4.0–10.5)

## 2015-03-06 LAB — URINE CULTURE
CULTURE: NO GROWTH
Colony Count: NO GROWTH

## 2015-03-06 LAB — COMPREHENSIVE METABOLIC PANEL
ALK PHOS: 238 U/L — AB (ref 38–126)
ALT: 38 U/L (ref 14–54)
AST: 41 U/L (ref 15–41)
Albumin: 2.5 g/dL — ABNORMAL LOW (ref 3.5–5.0)
Anion gap: 6 (ref 5–15)
CALCIUM: 8.1 mg/dL — AB (ref 8.9–10.3)
CHLORIDE: 104 mmol/L (ref 101–111)
CO2: 28 mmol/L (ref 22–32)
Creatinine, Ser: 0.77 mg/dL (ref 0.44–1.00)
GFR calc Af Amer: >60 mL/min (ref >60)
GFR calc non Af Amer: >60 mL/min (ref >60)
Glucose, Bld: 92 mg/dL (ref 65–99)
Potassium: 3.6 mmol/L (ref 3.5–5.1)
Sodium: 138 mmol/L (ref 135–145)
TOTAL PROTEIN: 6.3 g/dL — AB (ref 6.5–8.1)
Total Bilirubin: 0.4 mg/dL (ref 0.3–1.2)

## 2015-03-06 MED ORDER — POLYETHYLENE GLYCOL 3350 17 G PO PACK
17.0000 g | PACK | Freq: Every day | ORAL | Status: DC
  Administered 2015-03-06 – 2015-03-22 (×17): 17 g via ORAL
  Filled 2015-03-06 (×17): qty 1

## 2015-03-06 MED ORDER — SODIUM CHLORIDE 0.9 % IV BOLUS (SEPSIS)
500.0000 mL | Freq: Once | INTRAVENOUS | Status: AC
  Administered 2015-03-06: 500 mL via INTRAVENOUS

## 2015-03-06 MED ORDER — ENOXAPARIN SODIUM 40 MG/0.4ML ~~LOC~~ SOLN
40.0000 mg | SUBCUTANEOUS | Status: DC
  Administered 2015-03-06 – 2015-03-07 (×2): 40 mg via SUBCUTANEOUS
  Filled 2015-03-06 (×2): qty 0.4

## 2015-03-06 MED ORDER — SENNOSIDES-DOCUSATE SODIUM 8.6-50 MG PO TABS
2.0000 | ORAL_TABLET | Freq: Two times a day (BID) | ORAL | Status: DC
  Administered 2015-03-06 – 2015-03-22 (×32): 2 via ORAL
  Filled 2015-03-06 (×34): qty 2

## 2015-03-06 NOTE — Progress Notes (Signed)
PROGRESS NOTE  Bianca Kent PZW:[id number] DOB: 18-Sep-1966 DOA: 03/03/2015 PCP: [name], MD  HPI/Recap of past 24 hours:  Mild confusion, easily reoriented, persistent hypoxia, mild sinus tachycardia, borderline low bp, fever curve seems trending down. Reported being constipated, no BM last week, denies ab pain.   Reported less facial pain  Assessment/Plan: Active Problems:   Post-operative state   Acute respiratory failure with hypoxia   Postprocedural fever   Breast cancer, stage 4   Thrombocytopenia  Fever/hypoxia/acute respiratory failure/confusion: Broad differential   Infection? from sinus? CNS? PNA? Patient currently denies headache, reported facial pain has improved from surgery. US unremarkable, blood culture pending, no cough. Lung exam, left lower lobe crackles. Will get CT chest without contrast (h/o dye allergy), STAT ABG, continue oxygen supplement. Continue empiric abx with vanc/zosyn. Will also get CT head.  Clotting? Patient does has high risk of DVT or PE in active cancer, review cbc showed acute drop of plt, consumption? Patient reported bilateral arm edema, no lower extremity edema, no chest pain, no cough. Not a candidate for CTA due to dye allergy. Will get upper extremity US to r/o DVT.  For now SCD's and prophylaxis lovenox, close monitor surgical site, stop lovenox if any sign of bleed.  PCCM consulted.   Metastatic breast cancer with brain meds:  Reported has not been getting full dose chemo due to recent illness, followed by Dr. [name].  S/p bilateral sinus surgery and Draf III frontal sinusotomy 03/03/2015. ENT following.  Constipation: stool softener.    Code Status: full  Family Communication: patient, sister in room and mother over the phone  Disposition Plan: remain in patient, low threshold to stepdown if patient deteriorate.   Consultants:  PCCM  ENT  Procedures: bilateral sinus surgery and Draf III frontal sinusotomy  03/03/2015  Antibiotics:  Vanc/zosyn from 5/13   Objective: BP 92/61 mmHg  Pulse 94  Temp(Src) 97.6 F (36.4 C) (Axillary)  Resp 17  Ht 5\' 7"  (1.702 m)  Wt 75.297 kg (166 lb)  BMI 25.99 kg/m2  SpO2 95%  LMP 04/22/2014  Intake/Output Summary (Last 24 hours) at 03/06/15 1450 Last data filed at 03/06/15 1300  Gross per 24 hour  Intake    120 ml  Output      0 ml  Net    120 ml   Filed Weights   03/03/15 1052  Weight: 75.297 kg (166 lb)    Exam:   General:  Chronically ill, anxious, s/p sinus surgery, dressing intact, no visible drainage or blood.  Cardiovascular: RRR  Respiratory: crackles left lower lobe, no wheezing, no rhonchi.  Abdomen: Soft/ND/NT, positive BS  Musculoskeletal: bilateral upper arm edema? Nonpitting, no edema lower extremity.  Neuro: slight confusion, but self corrected.   Data Reviewed: Basic Metabolic Panel:  Recent Labs Lab 03/05/15 1500 03/06/15 0425  NA 136 138  K 3.2* 3.6  CL 99* 104  CO2 27 28  GLUCOSE 130* 92  BUN <5* <5*  CREATININE 0.85 0.77  CALCIUM 8.7* 8.1*   Liver Function Tests:  Recent Labs Lab 03/05/15 1500 03/06/15 0425  AST 60* 41  ALT 51 38  ALKPHOS 278* 238*  BILITOT 0.7 0.4  PROT 6.4* 6.3*  ALBUMIN 2.8* 2.5*   No results for input(s): LIPASE, AMYLASE in the last 168 hours. No results for input(s): AMMONIA in the last 168 hours. CBC:  Recent Labs Lab 03/05/15 1500 03/06/15 0425  WBC 8.7 7.2  HGB 9.9* 8.9*  HCT 30.2* 27.7*  MCV 85.8 86.6  PLT 102* 98*   Cardiac Enzymes:   No results for input(s): CKTOTAL, CKMB, CKMBINDEX, TROPONINI in the last 168 hours. BNP (last 3 results)  Recent Labs  03/05/15 1851  BNP 48.8    ProBNP (last 3 results) No results for input(s): PROBNP in the last 8760 hours.  CBG: No results for input(s): GLUCAP in the last 168 hours.  Recent Results (from the past 240 hour(s))  Culture, routine-sinus     Status: None (Preliminary result)   Collection Time:  03/03/15  3:58 PM  Result Value Ref Range Status   Specimen Description NASAL SWAB  Final   Special Requests NONE  Final   Culture   Final    Culture reincubated for better growth Performed at [facility]    Report Status PENDING  Incomplete  MRSA PCR Screening     Status: None   Collection Time: 03/03/15 10:24 PM  Result Value Ref Range Status   MRSA by PCR NEGATIVE NEGATIVE Final    Comment:        The GeneXpert MRSA Assay (FDA approved for NASAL specimens only), is one component of a comprehensive MRSA colonization surveillance program. It is not intended to diagnose MRSA infection nor to guide or monitor treatment for MRSA infections.   Culture, blood (routine x 2)     Status: None (Preliminary result)   Collection Time: 03/05/15  3:00 PM  Result Value Ref Range Status   Specimen Description BLOOD LEFT HAND  Final   Special Requests BOTTLES DRAWN AEROBIC AND ANAEROBIC 10CC  Final   Culture PENDING  Incomplete   Report Status PENDING  Incomplete     Studies: Dg Chest 2 View  03/05/2015   CLINICAL DATA:  Hypoxemia  EXAM: CHEST  2 VIEW  COMPARISON:  12/22/2014  FINDINGS: Cardiac shadow is stable. A left chest wall port is again seen. Bibasilar atelectasis/ infiltrate is noted. No sizable effusion is seen. These changes have increased somewhat in the interval from the prior exam particularly in the left lung base.  IMPRESSION: Bibasilar atelectasis/ infiltrate increased in the left base from the prior study. The right basilar changes are stable.   Electronically Signed   By: Inez Catalina M.D.   On: 03/05/2015 15:39    Scheduled Meds: . acidophilus  2 capsule Oral BID  . antiseptic oral rinse  7 mL Mouth Rinse BID  . calcium carbonate  2 tablet Oral BID  . dronabinol  10 mg Oral TID AC  . fentaNYL  50 mcg Transdermal Q72H  . methylphenidate  10 mg Oral BID  . multivitamin with minerals  1 tablet Oral Daily  . OLANZapine  5 mg Oral QHS  . pantoprazole  40 mg Oral  Daily  . piperacillin-tazobactam (ZOSYN)  IV  3.375 g Intravenous Q8H  . polyethylene glycol  17 g Oral Daily  . scopolamine  1 patch Transdermal Q72H  . senna-docusate  2 tablet Oral BID  . sodium chloride  10-40 mL Intracatheter Q12H  . vancomycin  750 mg Intravenous Q8H  . Vitamin D (Ergocalciferol)  50,000 Units Oral Q Thu    Continuous Infusions: . lactated ringers 10 mL/hr at 03/03/15 1148  . sodium chloride 0.9 % 1,000 mL with potassium chloride 20 mEq infusion 100 mL/hr at 03/06/15 0930     Time spent: >32mins  Aerin Delany MD, PhD  [facility] Pager [phone]. If 7PM-7AM, please contact night-coverage at [email or web address], password [facility] 03/06/2015, 2:50 PM  LOS: 3 days

## 2015-03-06 NOTE — Consult Note (Signed)
Name: Bianca Kent MRN: [id number] DOB: Mar 03, 1966    ADMISSION DATE:  03/03/2015 CONSULTATION DATE:  5/14  REFERRING MD :  Erlinda Hong  CHIEF COMPLAINT/reason for consult: hypoxia/SIRS (possible sepsis)  BRIEF PATIENT DESCRIPTION: This is a 49 year old female w/ sig history of metastatic breast cancer w/ mets to brain (followed by [facility]). Last partial dose chemo 5/5, last xrt to left temporal area Feb 2016. Had remote h/o sinus surgery from which she had CSF leak w/ prior repair. 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 Surgery was uneventful but she was kept in the hospital over night as she had post-op sats in low 90s and needed pain management. Developed fever and had asymptomatic hypoxia w/ room air sats 70s on 5/13. Given IVFs, ABX widened. PCCM asked to see the following day for hypoxia and concern for sepsis.   SIGNIFICANT EVENTS  5/11: bilateral sinus surgery and Draf III sinusotomy w/ removal of fronto-ethmoid mucocele  STUDIES:     HISTORY OF PRESENT ILLNESS:   This is a 49 year old female w/ sig history of metastatic breast cancer w/ mets to brain (followed by [facility]). Last partial dose chemo 5/5, last xrt to left temporal area Feb 2016. Had remote h/o sinus surgery from which she had CSF leak w/ prior repair. 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 Surgery was uneventful but she was kept in the hospital over night as she had post-op sats in low 90s and needed pain management. She was to be discharged on 5/13 when on nursing rounds she was discovered to have temp of 102 w/ room air sats of 70%. She was then transferred to the IM service for further evaluation. She was placed on supplemental oxygen. She denied any shortness of breath, significant cough, chest  pain, wheeze or neck discomfort. She denied dysuria or hematuria. Her sister who was very attentive at the bedside noted intermittent confusion which caused the family significant alarm. CXR and evaluation showed bibasilar atx, and lactic acid of 2.4. She was given IV saline, antibiotics widened and continued to get supportive care. Her WBC ct has remained nml, she has had persistent thrombocytopenia, she has not had further spike in fevers. Her abg did show PO2 of 61 mmHg, PCCM was asked to see given concern for possible occult infection, hypoxia and sirs/sepsis   PAST MEDICAL HISTORY :   has a past medical history of Allergy; Asthma; Coronary artery spasm; Sleep apnea; Pneumonia; UTI (lower urinary tract infection); Hypertension; Radiation (08/06/14-09/16/14); Cancer; Metastasis from breast cancer (11/26/14); Breast cancer (02/2014); History of radiation therapy (12/16/14); PONV (postoperative nausea and vomiting); Family history of anesthesia complication; Shortness of breath dyspnea; GERD (gastroesophageal reflux disease); and Arthritis.  has past surgical history that includes Cholecystectomy; Knee surgery (Right); Tendon repair (Left); Tonsillectomy; Sinus surgery with Instatrak; Tubal ligation; Colonoscopy w/ polypectomy (2008); Wisdom tooth extraction; Cardiac catheterization (X 3); Portacath placement (Left, 03/26/2014); Breast biopsy with sentinel lymph node biopsy and needle localization; Sinusotomy (03/03/2015); and Sinus endo w/fusion (Bilateral, 03/03/2015). Prior to Admission medications   Medication Sig Start Date End Date Taking? Authorizing Provider  ALPRAZolam Duanne Moron) 1 MG tablet Take 1 tablet (1 mg total) by mouth at bedtime as needed for anxiety. 10/29/14  Yes [name], MD  aspirin (ASPIRIN  EC) 81 MG EC tablet Take 81 mg by mouth daily. Swallow whole.   Yes Historical Provider, MD  Bismuth Subsalicylate (PEPTO-BISMOL PO) Take 1 tablet by mouth as needed (upset stomach).    Yes Historical  Provider, MD  calcium carbonate (TUMS - DOSED IN MG ELEMENTAL CALCIUM) 500 MG chewable tablet Chew 2 tablets by mouth 2 (two) times daily.   Yes Historical Provider, MD  diphenhydrAMINE (SOMINEX) 25 MG tablet Take 100 mg by mouth at bedtime as needed for sleep. Pt states she is taking up to 200 mg Benadryl at night and still not sleeping   Yes Historical Provider, MD  dronabinol (MARINOL) 10 MG capsule Take 1 capsule (10 mg total) by mouth 3 (three) times daily before meals. 10/29/14  Yes [name], MD  ergocalciferol (VITAMIN D2) 50000 UNITS capsule Take 1 capsule (50,000 Units total) by mouth once a week. 11/19/14  Yes [name], MD  fentaNYL (DURAGESIC) 50 MCG/HR Place 1 patch (50 mcg total) onto the skin every 3 (three) days. 08/06/14  Yes [name], MD  HYDROmorphone (DILAUDID) 4 MG tablet Take 1/2 - 1 pill, IF NEEDED, for pain every 4 hrs 12/17/14  Yes [name], MD  lidocaine (XYLOCAINE) 2 % solution Use as directed 20 mLs in the mouth or throat as needed for mouth pain. 11/19/14  Yes [name], MD  lidocaine-prilocaine (EMLA) cream Apply 1 application topically as needed. Placeonto port site 53min prior to treatment and cover with plastic wrap. 10/29/14  Yes [name], MD  loratadine-pseudoephedrine (CLARITIN-D 24-HOUR) 10-240 MG per 24 hr tablet Take 1 tablet by mouth daily.   Yes Historical Provider, MD  LORazepam (ATIVAN) 1 MG tablet Place 1 tablet under the tongue, IF NEEDED, every 6 hours for nausea. 12/17/14  Yes [name], MD  methylphenidate (RITALIN) 10 MG tablet Take 1 tablet (10 mg total) by mouth 2 (two) times daily. 10/08/14  Yes [name], MD  metoCLOPramide (REGLAN) 10 MG tablet Take 1 tablet (10 mg total) by mouth every 6 (six) hours as needed for nausea. 10/29/14  Yes [name], MD  Multiple Vitamin (MULTIVITAMIN) tablet Take 1 tablet by mouth daily.   Yes Historical Provider, MD  naproxen sodium (ANAPROX) 220 MG tablet Take 220 mg by  mouth 2 (two) times daily with a meal.   Yes Historical Provider, MD  NONFORMULARY OR COMPOUNDED ITEM Take 5 mLs by mouth as needed (irritation). DUKES MOUTHWASH  SWISH AND SWALLOW   Yes Historical Provider, MD  OLANZapine (ZYPREXA) 5 MG tablet Take 1 tablet (5 mg total) by mouth at bedtime. 12/17/14  Yes [name], MD  ondansetron (ZOFRAN) 8 MG tablet TAKE 1 TABLET BY MOUTH EVERY 8 HOURS AS NEEDED FOR NAUSEA OR VOMITING 12/03/14  Yes [name], MD  pantoprazole (PROTONIX) 40 MG tablet Take 1 tablet (40 mg total) by mouth daily. 06/04/14  Yes [name], MD  prochlorperazine (COMPAZINE) 10 MG tablet Take 1 tablet (10 mg total) by mouth every 6 (six) hours as needed for nausea or vomiting. 06/04/14  Yes [name], MD  promethazine (PHENERGAN) 25 MG suppository Place 1 suppository (25 mg total) rectally every 6 (six) hours as needed for nausea or vomiting. 07/16/14  Yes [name], MD  traMADol (ULTRAM) 50 MG tablet Take 1-2 tablets (50-100 mg total) by mouth every 6 (six) hours as needed. Patient taking differently: Take 50-100 mg by mouth every  6 (six) hours as needed (pain).  03/02/15  Yes [name], MD  traZODone (DESYREL) 100 MG tablet Take 1-2 if needed at bed for sleep. Patient taking differently: Take 100-200 mg by mouth at bedtime as needed for sleep. Take 1-2 if needed at bed for sleep. 10/08/14  Yes [name], MD  albuterol (PROVENTIL HFA;VENTOLIN HFA) 108 (90 BASE) MCG/ACT inhaler Inhale 2 puffs into the lungs every 4 (four) hours as needed for wheezing or shortness of breath. Patient not taking: Reported on 02/25/2015 05/14/14   [name], MD  doxycycline (VIBRA-TABS) 100 MG tablet Take 1 tablet (100 mg total) by mouth every 12 (twelve) hours. 03/05/15   [name], MD  Lactulose SOLN Take 2 TBLSP every 6 hours until bowel movement Patient not taking: Reported on 02/25/2015 12/17/14   [name], MD  pyridoxine (B-6) 250 MG tablet Take 1 tablet (250  mg total) by mouth daily. Patient not taking: Reported on 02/25/2015 02/12/15   Eliezer Bottom, NP   Allergies  Allergen Reactions  . Iohexol      Code: RASH, Desc: VERY STRONG FAMILY HX OF ANGIOEDEMA WHEN RECEIVING IV CONTRAST; PT HAS BEEN PREMEDICATED FOR OTHER CONTRASTED STUDIES(IN CATH. LAB)  KR, Onset Date: [id number]   . Prednisone Itching    Capillary beds bust  . Tetanus Toxoids Other (See Comments)    Ran a high fever for 48 hours  . Theophyllines Hives    Mental changes  . Versed [Midazolam] Other (See Comments)    Pt becomes violent    FAMILY HISTORY:  family history includes Cancer in her maternal grandmother, paternal aunt, paternal grandfather, and paternal grandmother; Coronary artery disease in her father, maternal grandfather, maternal grandmother, mother, and paternal grandmother; Depression in her maternal grandfather and maternal grandmother; Diabetes in her mother; Hypertension in her father, mother, sister, and sister; Stroke in her maternal grandfather and maternal grandmother. SOCIAL HISTORY:  reports that she quit smoking about 7 years ago. Her smoking use included Cigarettes. She started smoking about 30 years ago. She has a 12 pack-year smoking history. She has never used smokeless tobacco. She reports that she drinks alcohol. She reports that she does not use illicit drugs.  REVIEW OF SYSTEMS (bolds are positive):   Constitutional: Negative for fever, chills, weight loss, malaise/fatigue and diaphoresis.  HENT: Negative for hearing loss, ear pain, nosebleeds, nasal congestion, no nasal drainage sore throat, no neck pain or ridgidity, tinnitus and ear discharge.   Eyes: Negative for blurred vision, double vision, photophobia, pain, discharge and redness.  Respiratory: Negative for productive cough, hemoptysis, sputum production, no shortness of breath, wheezing and stridor.   Cardiovascular: Negative for chest pain, palpitations, orthopnea, claudication, leg  swelling and PND. + Upper extremity dependent edema bilaterally  Gastrointestinal: Negative for heartburn, nausea, vomiting, abdominal pain, diarrhea, constipation, blood in stool and melena.  Genitourinary: Negative for dysuria, urgency, frequency, hematuria and flank pain.  Musculoskeletal: Negative for myalgias, back pain, joint pain and falls.  Skin: Negative for itching and rash.  Neurological: Negative for dizziness, tingling, tremors, sensory change, speech change, focal weakness, seizures, loss of consciousness, weakness and headaches.  Endo/Heme/Allergies: Negative for environmental allergies and polydipsia. Does not bruise/bleed easily.   VITAL SIGNS: Temp:  [97.6 F (36.4 C)-100.6 F (38.1 C)] 97.6 F (36.4 C) (05/14 1402) Pulse Rate:  [94-118] 94 (05/14 1445) Resp:  [17-20] 17 (05/14 1445) BP: (92-118)/(49-65) 92/61 mmHg (05/14 1402) SpO2:  [90 %-98 %] 95 % (  05/14 1445) FiO2 (%):  [60 %] 60 % (05/14 1445) 60% face tent  PHYSICAL EXAMINATION: General:  49 year old female, s/p sinus surgery, currently not in any distress.  Neuro:  Awake, alert, oriented. No focal def  HEENT:  + facial edema, sinus packing. Otherwise nml cephalic  Cardiovascular:  rrr Lungs:  Crackles both bases, no accessory muscle use  Abdomen:  Soft, non-tender + bowel sounds  Musculoskeletal:  Intact  Skin:  Trace UE edema    Recent Labs Lab 03/05/15 1500 03/06/15 0425  NA 136 138  K 3.2* 3.6  CL 99* 104  CO2 27 28  BUN <5* <5*  CREATININE 0.85 0.77  GLUCOSE 130* 92    Recent Labs Lab 03/05/15 1500 03/06/15 0425  HGB 9.9* 8.9*  HCT 30.2* 27.7*  WBC 8.7 7.2  PLT 102* 98*   ABG    Component Value Date/Time   PHART 7.376 03/06/2015 1440   PCO2ART 46.3* 03/06/2015 1440   PO2ART 61.2* 03/06/2015 1440   HCO3 26.5* 03/06/2015 1440   TCO2 27.9 03/06/2015 1440   ACIDBASEDEF 1.0 08/11/2007 2259   O2SAT 89.7 03/06/2015 1440    Dg Chest 2 View  03/05/2015   CLINICAL DATA:  Hypoxemia   EXAM: CHEST  2 VIEW  COMPARISON:  12/22/2014  FINDINGS: Cardiac shadow is stable. A left chest wall port is again seen. Bibasilar atelectasis/ infiltrate is noted. No sizable effusion is seen. These changes have increased somewhat in the interval from the prior exam particularly in the left lung base.  IMPRESSION: Bibasilar atelectasis/ infiltrate increased in the left base from the prior study. The right basilar changes are stable.   Electronically Signed   By: Inez Catalina M.D.   On: 03/05/2015 15:39    ASSESSMENT / PLAN:  Post-operative fever/ SIRS, r/o sepsis.   Not even meeting SIRS criteria at this point. d-dx: atelectasis, aspiration, DVT (doubt),  Occult CSF infection (doubt no meningeal rigidity . Clinically no cough. CXR more c/w atx.  Plan Continue current abx F/u cultures Add PCT algorithm  Have d/c'd CT head/chest as would not assist in further evaluation here    Intermittent acute hypoxic Respiratory failure. Almost certainly due to atelectasis (identified on CXR today and yesterday), and likely exacerbated by sinus packing as well as underlying h/o OSA which may also be exacerbated by narcotics in this situation.  Nasal packing w/ sleep apnea and atx the likely contributor here Plan No need for CT scan.  Careful w/ narcotics  Push pulm hygiene  Cont abx   Intermittent confusion Suspect multifactorial: narcotics, sleep deprivation, possibly intermittent hypoxia.  Currently oriented.  Plan Supportive care  Thrombocytopenia Likey d/t recent chemo. Stable Plan Trend   S/p sinus surgery w. Nasal packing Plan Per ENT   H/o metastatic breast cancer  Plan Will f/u w/ [facility] as out-pt and cont [place]   Symmetric upper extremity edema. Doubt DVT Plan F/u US  Peter E Babcock ACNP-BC [place] Pager # [phone] OR # [phone] if no answer  Attending Note:  I have examined patient, reviewed labs, studies and notes. I have discussed the case with  Jerrye Bushy, and I agree with the data and plans as amended above. Pt with fever, facial pain and hypoxemia that is now most prominent when sleeping. Also with some altered MS. I suspect that we can explain these findings given her bibasilar atx, inflammation and packing in her nares / nasopharynx, known dx of mild OSA (not treated).  She is not a candidate for CPAP s/p surgery, so will have to make do with supplemental O2. Most concerning possibilities here would be a CSF infxn post sinus sgy (no clinical signs to support) or possibly PE. I doubt either of these. We will follow her to insure no clinical changes. If so then we will work up as appropriate.  I think we can cancel the chest and head CT's for now. Will follow for the UE doppler US. Careful with narcotics, which may be a contributor here.   Baltazar Apo, MD, PhD 03/06/2015, 4:09 PM [facility] [phone] or if no answer [phone]

## 2015-03-07 ENCOUNTER — Inpatient Hospital Stay (HOSPITAL_COMMUNITY): Payer: 59

## 2015-03-07 DIAGNOSIS — R609 Edema, unspecified: Secondary | ICD-10-CM

## 2015-03-07 DIAGNOSIS — J9811 Atelectasis: Secondary | ICD-10-CM

## 2015-03-07 DIAGNOSIS — D696 Thrombocytopenia, unspecified: Secondary | ICD-10-CM

## 2015-03-07 DIAGNOSIS — G4733 Obstructive sleep apnea (adult) (pediatric): Secondary | ICD-10-CM

## 2015-03-07 DIAGNOSIS — J189 Pneumonia, unspecified organism: Secondary | ICD-10-CM | POA: Insufficient documentation

## 2015-03-07 LAB — CBC
HEMATOCRIT: 26.7 % — AB (ref 36.0–46.0)
HEMOGLOBIN: 8.6 g/dL — AB (ref 12.0–15.0)
MCH: 28.3 pg (ref 26.0–34.0)
MCHC: 32.2 g/dL (ref 30.0–36.0)
MCV: 87.8 fL (ref 78.0–100.0)
PLATELETS: 87 10*3/uL — AB (ref 150–400)
RBC: 3.04 MIL/uL — ABNORMAL LOW (ref 3.87–5.11)
RDW: 19.1 % — AB (ref 11.5–15.5)
WBC: 6.5 10*3/uL (ref 4.0–10.5)

## 2015-03-07 LAB — BASIC METABOLIC PANEL
Anion gap: 8 (ref 5–15)
BUN: 5 mg/dL — AB (ref 6–20)
CALCIUM: 8 mg/dL — AB (ref 8.9–10.3)
CHLORIDE: 104 mmol/L (ref 101–111)
CO2: 26 mmol/L (ref 22–32)
Creatinine, Ser: 0.78 mg/dL (ref 0.44–1.00)
Glucose, Bld: 87 mg/dL (ref 65–99)
Potassium: 3.4 mmol/L — ABNORMAL LOW (ref 3.5–5.1)
SODIUM: 138 mmol/L (ref 135–145)

## 2015-03-07 LAB — LACTIC ACID, PLASMA: LACTIC ACID, VENOUS: 0.8 mmol/L (ref 0.5–2.0)

## 2015-03-07 LAB — PROCALCITONIN: Procalcitonin: 0.14 ng/mL

## 2015-03-07 LAB — VANCOMYCIN, TROUGH: Vancomycin Tr: 27 ug/mL (ref 10.0–20.0)

## 2015-03-07 MED ORDER — FLORANEX PO PACK
1.0000 g | PACK | Freq: Three times a day (TID) | ORAL | Status: DC
  Administered 2015-03-08 – 2015-03-20 (×32): 1 g via ORAL
  Filled 2015-03-07 (×48): qty 1

## 2015-03-07 MED ORDER — NYSTATIN 100000 UNIT/ML MT SUSP
5.0000 mL | Freq: Four times a day (QID) | OROMUCOSAL | Status: DC
  Administered 2015-03-07 – 2015-03-08 (×7): 500000 [IU] via ORAL
  Filled 2015-03-07 (×7): qty 5

## 2015-03-07 NOTE — Progress Notes (Signed)
Subjective: Doing better this am, no increased wob.  Requiring oxygen by face shield due to packing in nares.  Objective: Vital signs in last 24 hours: Blood pressure 108/66, pulse 91, temperature 99.3 F (37.4 C), temperature source Axillary, resp. rate 17, height 5\' 7"  (1.702 m), weight 75.297 kg (166 lb), last menstrual period 04/22/2014, SpO2 93 %.  Intake/Output from previous day: 05/14 0701 - 05/15 0700 In: 4925 [P.O.:600; I.V.:3650; IV Piggyback:675] Out: 600 [Urine:600]   Physical Exam:   ow female in nad Nose packed post op OP clear Neck without LN or TMG Chest with very small depth of inspiration, basilar crackles Cor with rrr abd benign LE without significant edema Alert, oriented, moves all 4.    Lab Results:  Recent Labs  03/05/15 1500 03/06/15 0425 03/07/15 0436  WBC 8.7 7.2 6.5  HGB 9.9* 8.9* 8.6*  HCT 30.2* 27.7* 26.7*  PLT 102* 98* 87*   BMET  Recent Labs  03/05/15 1500 03/06/15 0425 03/07/15 0436  NA 136 138 138  K 3.2* 3.6 3.4*  CL 99* 104 104  CO2 27 28 26   GLUCOSE 130* 92 87  BUN <5* <5* 5*  CREATININE 0.85 0.77 0.78  CALCIUM 8.7* 8.1* 8.0*    Studies/Results: Dg Chest 2 View  03/05/2015   CLINICAL DATA:  Hypoxemia  EXAM: CHEST  2 VIEW  COMPARISON:  12/22/2014  FINDINGS: Cardiac shadow is stable. A left chest wall port is again seen. Bibasilar atelectasis/ infiltrate is noted. No sizable effusion is seen. These changes have increased somewhat in the interval from the prior exam particularly in the left lung base.  IMPRESSION: Bibasilar atelectasis/ infiltrate increased in the left base from the prior study. The right basilar changes are stable.   Electronically Signed   By: Inez Catalina M.D.   On: 03/05/2015 15:39   Dg Chest Port 1 View  03/06/2015   CLINICAL DATA:  Acute respiratory failure, short of breath.  EXAM: PORTABLE CHEST - 1 VIEW  COMPARISON:  Radiograph 03/05/2015  FINDINGS: Left car port unchanged. There are low lung volumes  and bibasilar atelectasis again demonstrated. Chronic elevation of the right hemidiaphragm. Right basilar atelectasis.  IMPRESSION: No interval change. Bibasilar atelectasis. Cannot exclude left lower lobe infiltrate.   Electronically Signed   By: Suzy Bouchard M.D.   On: 03/06/2015 15:35    Assessment/Plan:  1) Acute respiratory failure secondary to significant atelectasis and immobility. She is better today, and I have encouraged her to work on [place].  I think she would do well with IPPB, but we apparently no longer have a Bird machine?? -continue aggressive pulmonary hygiene -recheck cxr in am -continue empiric abx for now.   2) OSA Unfortunately the pt is not able to wear cpap post sinus surgery.  Can use supplemental oxygen by face tent during sleep       Kathee Delton, M.D. 03/07/2015, 12:07 PM

## 2015-03-07 NOTE — Progress Notes (Signed)
PROGRESS NOTE  Bianca Kent DJT:[id number] DOB: 05/05/1966 DOA: 03/03/2015 PCP: [name], MD  HPI/Recap of past 24 hours:   reported oral thrush, less facial pain, no headache, no cough, borderline low bp, fever curve seems trending down. Mother at bedside, reported mental status back to  Baseline.  no BM yet.  Reported less facial pain  Assessment/Plan: Active Problems:   Post-operative state   Acute respiratory failure with hypoxia   Postprocedural fever   Breast cancer, stage 4   Thrombocytopenia   Confusion   Hypoxemia  Fever/hypoxia/acute respiratory failure/confusion: Broad differential   Infection? from sinus? CNS? PNA? Patient currently denies headache, reported facial pain has improved from surgery. US unremarkable, blood culture pending, no cough. Lung exam, left lower lobe crackles. CT chest was cancelled by CCM, repeat cxr on 5/16, continue oxygen supplement. Continue empiric abx with vanc/zosyn. Mother states mental status better today, Ct head cancelled by CCM.  Clotting?  Patient does has high risk of DVT or PE in active cancer,  Patient reported bilateral arm edema, no lower extremity edema, no chest pain, no cough. Not a candidate for CTA due to dye allergy. upper extremity US no DVT.  Continue SCD's prophylaxis lovenox on 5/15 stopped due to worsening of thrombocytopenia.  Thrombocytopenia: from chemo vs infection. Continue monitor.    Metastatic breast cancer with brain meds:  Reported has not been getting full dose chemo due to recent illness, followed by Dr. [name].  S/p bilateral sinus surgery and Draf III frontal sinusotomy 03/03/2015. ENT following.  Constipation: stool softener. Declined enema.    Code Status: full  Family Communication: patient, mother at bedside. (mother was a surgical ICU RN for 72yrs at [employer] [facility] [employer])  Disposition Plan: remain in patient, low threshold to stepdown if patient  deteriorate.   Consultants:  PCCM  ENT  Procedures: bilateral sinus surgery and Draf III frontal sinusotomy 03/03/2015  Antibiotics:  Vanc/zosyn from 5/13   Objective: BP 108/66 mmHg  Pulse 91  Temp(Src) 99.3 F (37.4 C) (Axillary)  Resp 17  Ht 5\' 7"  (1.702 m)  Wt 75.297 kg (166 lb)  BMI 25.99 kg/m2  SpO2 93%  LMP 04/22/2014  Intake/Output Summary (Last 24 hours) at 03/07/15 1525 Last data filed at 03/07/15 0630  Gross per 24 hour  Intake   2595 ml  Output    600 ml  Net   1995 ml   Filed Weights   03/03/15 1052  Weight: 75.297 kg (166 lb)    Exam:   General:  Chronically ill, less anxious, s/p sinus surgery, no visible drainage or blood.  Cardiovascular: RRR  Respiratory: crackles left lower lobe, no wheezing, no rhonchi.  Abdomen: Soft/ND/NT, positive BS  Musculoskeletal: bilateral upper arm edema? Nonpitting, no edema lower extremity.  Neuro: AAOX4.  Data Reviewed: Basic Metabolic Panel:  Recent Labs Lab 03/05/15 1500 03/06/15 0425 03/07/15 0436  NA 136 138 138  K 3.2* 3.6 3.4*  CL 99* 104 104  CO2 27 28 26   GLUCOSE 130* 92 87  BUN <5* <5* 5*  CREATININE 0.85 0.77 0.78  CALCIUM 8.7* 8.1* 8.0*   Liver Function Tests:  Recent Labs Lab 03/05/15 1500 03/06/15 0425  AST 60* 41  ALT 51 38  ALKPHOS 278* 238*  BILITOT 0.7 0.4  PROT 6.4* 6.3*  ALBUMIN 2.8* 2.5*   No results for input(s): LIPASE, AMYLASE in the last 168 hours. No results for input(s): AMMONIA in the last 168 hours. CBC:  Recent Labs Lab  03/05/15 1500 03/06/15 0425 03/07/15 0436  WBC 8.7 7.2 6.5  HGB 9.9* 8.9* 8.6*  HCT 30.2* 27.7* 26.7*  MCV 85.8 86.6 87.8  PLT 102* 98* 87*   Cardiac Enzymes:   No results for input(s): CKTOTAL, CKMB, CKMBINDEX, TROPONINI in the last 168 hours. BNP (last 3 results)  Recent Labs  03/05/15 1851  BNP 48.8    ProBNP (last 3 results) No results for input(s): PROBNP in the last 8760 hours.  CBG: No results for  input(s): GLUCAP in the last 168 hours.  Recent Results (from the past 240 hour(s))  Culture, routine-sinus     Status: None (Preliminary result)   Collection Time: 03/03/15  3:58 PM  Result Value Ref Range Status   Specimen Description NASAL SWAB  Final   Special Requests NONE  Final   Culture   Final    Culture reincubated for better growth Performed at [facility]    Report Status PENDING  Incomplete  MRSA PCR Screening     Status: None   Collection Time: 03/03/15 10:24 PM  Result Value Ref Range Status   MRSA by PCR NEGATIVE NEGATIVE Final    Comment:        The GeneXpert MRSA Assay (FDA approved for NASAL specimens only), is one component of a comprehensive MRSA colonization surveillance program. It is not intended to diagnose MRSA infection nor to guide or monitor treatment for MRSA infections.   Culture, blood (routine x 2)     Status: None (Preliminary result)   Collection Time: 03/05/15  3:00 PM  Result Value Ref Range Status   Specimen Description BLOOD LEFT HAND  Final   Special Requests BOTTLES DRAWN AEROBIC AND ANAEROBIC 10CC  Final   Culture   Final           BLOOD CULTURE RECEIVED NO GROWTH TO DATE CULTURE WILL BE HELD FOR 5 DAYS BEFORE ISSUING A FINAL NEGATIVE REPORT Performed at [facility]    Report Status PENDING  Incomplete  Culture, blood (routine x 2)     Status: None (Preliminary result)   Collection Time: 03/05/15  3:10 PM  Result Value Ref Range Status   Specimen Description BLOOD RIGHT HAND  Final   Special Requests Reviewed by Louanne Belton. Rodney Cruise, M.D. [facility]  Final   Culture   Final           BLOOD CULTURE RECEIVED NO GROWTH TO DATE CULTURE WILL BE HELD FOR 5 DAYS BEFORE ISSUING A FINAL NEGATIVE REPORT Note: Culture results may be compromised due to an inadequate volume of blood received in culture bottles. Performed at [facility]    Report Status PENDING  Incomplete  Urine culture     Status: None   Collection Time:  03/05/15  7:00 PM  Result Value Ref Range Status   Specimen Description URINE, CLEAN CATCH  Final   Special Requests NONE  Final   Colony Count NO GROWTH Performed at [facility]   Final   Culture NO GROWTH Performed at [facility]   Final   Report Status 03/06/2015 FINAL  Final     Studies: Dg Chest Port 1 View  03/06/2015   CLINICAL DATA:  Acute respiratory failure, short of breath.  EXAM: PORTABLE CHEST - 1 VIEW  COMPARISON:  Radiograph 03/05/2015  FINDINGS: Left car port unchanged. There are low lung volumes and bibasilar atelectasis again demonstrated. Chronic elevation of the right hemidiaphragm. Right basilar atelectasis.  IMPRESSION: No  interval change. Bibasilar atelectasis. Cannot exclude left lower lobe infiltrate.   Electronically Signed   By: Suzy Bouchard M.D.   On: 03/06/2015 15:35    Scheduled Meds: . acidophilus  2 capsule Oral BID  . antiseptic oral rinse  7 mL Mouth Rinse BID  . calcium carbonate  2 tablet Oral BID  . dronabinol  10 mg Oral TID AC  . fentaNYL  50 mcg Transdermal Q72H  . lactobacillus  1 g Oral TID WC  . methylphenidate  10 mg Oral BID  . multivitamin with minerals  1 tablet Oral Daily  . nystatin  5 mL Oral QID  . OLANZapine  5 mg Oral QHS  . pantoprazole  40 mg Oral Daily  . piperacillin-tazobactam (ZOSYN)  IV  3.375 g Intravenous Q8H  . polyethylene glycol  17 g Oral Daily  . scopolamine  1 patch Transdermal Q72H  . senna-docusate  2 tablet Oral BID  . sodium chloride  10-40 mL Intracatheter Q12H  . vancomycin  750 mg Intravenous Q8H  . Vitamin D (Ergocalciferol)  50,000 Units Oral Q Thu    Continuous Infusions: . sodium chloride 0.9 % 1,000 mL with potassium chloride 20 mEq infusion 100 mL/hr at 03/06/15 2127     Time spent: >13mins  Corey Laski MD, PhD  [facility] Pager [phone]. If 7PM-7AM, please contact night-coverage at [email or web address], password [facility] 03/07/2015, 3:25 PM  LOS: 4 days

## 2015-03-07 NOTE — Progress Notes (Signed)
VASCULAR LAB PRELIMINARY  PRELIMINARY  PRELIMINARY  PRELIMINARY  Bilateral upper extremity venous Dopplers completed.    Preliminary report:  There is no obvious evidence of DVT or SVT noted in the bilateral upper extremities.   Kathlyne Loud, RVT 03/07/2015, 9:20 AM

## 2015-03-07 NOTE — Progress Notes (Signed)
ANTIBIOTIC CONSULT NOTE - FOLLOW UP  Pharmacy Consult for vancomycin Indication: rule out sepsis  Allergies  Allergen Reactions  . Iohexol      Code: RASH, Desc: VERY STRONG FAMILY HX OF ANGIOEDEMA WHEN RECEIVING IV CONTRAST; PT HAS BEEN PREMEDICATED FOR OTHER CONTRASTED STUDIES(IN CATH. LAB)  KR, Onset Date: [id number]   . Prednisone Itching    Capillary beds bust  . Tetanus Toxoids Other (See Comments)    Ran a high fever for 48 hours  . Theophyllines Hives    Mental changes  . Versed [Midazolam] Other (See Comments)    Pt becomes violent    Patient Measurements: Height: 5\' 7"  (170.2 cm) Weight: 166 lb (75.297 kg) IBW/kg (Calculated) : 61.6 Vital Signs: Temp: 98.7 F (37.1 C) (05/15 1410) Temp Source: Axillary (05/15 1410) BP: 94/61 mmHg (05/15 1410) Pulse Rate: 85 (05/15 1500) Intake/Output from previous day: 05/14 0701 - 05/15 0700 In: 4925 [P.O.:600; I.V.:3650; IV Piggyback:675] Out: 600 [Urine:600] Intake/Output from this shift:    Labs:  Recent Labs  03/05/15 1500 03/06/15 0425 03/07/15 0436  WBC 8.7 7.2 6.5  HGB 9.9* 8.9* 8.6*  PLT 102* 98* 87*  CREATININE 0.85 0.77 0.78   Estimated Creatinine Clearance: 90.1 mL/min (by C-G formula based on Cr of 0.78).  Recent Labs  03/07/15 1737  [place] 27*     Microbiology: Recent Results (from the past 720 hour(s))  Culture, routine-sinus     Status: None (Preliminary result)   Collection Time: 03/03/15  3:58 PM  Result Value Ref Range Status   Specimen Description NASAL SWAB  Final   Special Requests NONE  Final   Culture   Final    Culture reincubated for better growth Performed at [facility]    Report Status PENDING  Incomplete  MRSA PCR Screening     Status: None   Collection Time: 03/03/15 10:24 PM  Result Value Ref Range Status   MRSA by PCR NEGATIVE NEGATIVE Final    Comment:        The GeneXpert MRSA Assay (FDA approved for NASAL specimens only), is one component of  a comprehensive MRSA colonization surveillance program. It is not intended to diagnose MRSA infection nor to guide or monitor treatment for MRSA infections.   Culture, blood (routine x 2)     Status: None (Preliminary result)   Collection Time: 03/05/15  3:00 PM  Result Value Ref Range Status   Specimen Description BLOOD LEFT HAND  Final   Special Requests BOTTLES DRAWN AEROBIC AND ANAEROBIC 10CC  Final   Culture   Final           BLOOD CULTURE RECEIVED NO GROWTH TO DATE CULTURE WILL BE HELD FOR 5 DAYS BEFORE ISSUING A FINAL NEGATIVE REPORT Performed at [facility]    Report Status PENDING  Incomplete  Culture, blood (routine x 2)     Status: None (Preliminary result)   Collection Time: 03/05/15  3:10 PM  Result Value Ref Range Status   Specimen Description BLOOD RIGHT HAND  Final   Special Requests Reviewed by Louanne Belton. Rodney Cruise, M.D. [facility]  Final   Culture   Final           BLOOD CULTURE RECEIVED NO GROWTH TO DATE CULTURE WILL BE HELD FOR 5 DAYS BEFORE ISSUING A FINAL NEGATIVE REPORT Note: Culture results may be compromised due to an inadequate volume of blood received in culture bottles. Performed at [facility]    Report Status  PENDING  Incomplete  Urine culture     Status: None   Collection Time: 03/05/15  7:00 PM  Result Value Ref Range Status   Specimen Description URINE, CLEAN CATCH  Final   Special Requests NONE  Final   Colony Count NO GROWTH Performed at [facility]   Final   Culture NO GROWTH Performed at [facility]   Final   Report Status 03/06/2015 FINAL  Final    Anti-infectives    Start     Dose/Rate Route Frequency Ordered Stop   03/06/15 0200  vancomycin (VANCOCIN) IVPB 750 mg/150 ml premix     750 mg 150 mL/hr over 60 Minutes Intravenous Every 8 hours 03/05/15 1547     03/05/15 1600  vancomycin (VANCOCIN) 1,250 mg in sodium chloride 0.9 % 250 mL IVPB     1,250 mg 166.7 mL/hr over 90 Minutes Intravenous  Once  03/05/15 1547 03/05/15 1936   03/05/15 1545  piperacillin-tazobactam (ZOSYN) IVPB 3.375 g  Status:  Discontinued     3.375 g 100 mL/hr over 30 Minutes Intravenous 3 times per day 03/05/15 1532 03/05/15 1532   03/05/15 1545  piperacillin-tazobactam (ZOSYN) IVPB 3.375 g  Status:  Discontinued     3.375 g 100 mL/hr over 30 Minutes Intravenous 3 times per day 03/05/15 1532 03/05/15 1534   03/05/15 1545  piperacillin-tazobactam (ZOSYN) IVPB 3.375 g     3.375 g 12.5 mL/hr over 240 Minutes Intravenous Every 8 hours 03/05/15 1534     03/05/15 0000  doxycycline (VIBRA-TABS) 100 MG tablet     100 mg Oral Every 12 hours 03/05/15 1102     03/04/15 1000  doxycycline (VIBRA-TABS) tablet 100 mg  Status:  Discontinued     100 mg Oral Every 12 hours 03/04/15 0612 03/05/15 1623   03/03/15 1215  clindamycin (CLEOCIN) IVPB 600 mg     600 mg 100 mL/hr over 30 Minutes Intravenous  Once 03/03/15 1201 03/03/15 1540      Assessment: 49 year old female o Zosyn and  vancomycin fro r/o sepsis with presumed pulmonary versus CNS/sinus source. 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 She is POD#4 of revision of bilateral sinuses.   -WBC= 6.5, tmax= 101, SCr= 0.78 and CrCl ~ 90. A vancomycin level today was 27 (at about 5:30pm) but it appears it was collected during the vancomycin infusion (started about 5:15pm).   Vanc 5/13 >> Zosyn 5/13 >> Doxy 5/12 >> 5/13  5/11 Nasal swab >> ngtd 5/13 BCx >> ngtd 5/13 UCx >> neg  Goal of Therapy:  Vancomycin trough level 15-20 mcg/ml  Plan:  -No vancomycin changes now -Will re-check a vancomycin level prior to next dose (due at 2am 5/16) -Consider discontinuing vancomycin? -No zosyn changes needed -Will follow renal function, cultures and clinical progress  Hildred Laser, Pharm D 03/07/2015 7:38 PM

## 2015-03-08 ENCOUNTER — Inpatient Hospital Stay (HOSPITAL_COMMUNITY): Payer: 59

## 2015-03-08 ENCOUNTER — Ambulatory Visit (HOSPITAL_COMMUNITY): Payer: 59

## 2015-03-08 DIAGNOSIS — R0600 Dyspnea, unspecified: Secondary | ICD-10-CM

## 2015-03-08 LAB — CBC
HCT: 26.6 % — ABNORMAL LOW (ref 36.0–46.0)
Hemoglobin: 8.5 g/dL — ABNORMAL LOW (ref 12.0–15.0)
MCH: 28 pg (ref 26.0–34.0)
MCHC: 32 g/dL (ref 30.0–36.0)
MCV: 87.5 fL (ref 78.0–100.0)
PLATELETS: 106 10*3/uL — AB (ref 150–400)
RBC: 3.04 MIL/uL — ABNORMAL LOW (ref 3.87–5.11)
RDW: 19 % — AB (ref 11.5–15.5)
WBC: 6.5 10*3/uL (ref 4.0–10.5)

## 2015-03-08 LAB — BASIC METABOLIC PANEL
Anion gap: 8 (ref 5–15)
BUN: 5 mg/dL — AB (ref 6–20)
CHLORIDE: 105 mmol/L (ref 101–111)
CO2: 27 mmol/L (ref 22–32)
CREATININE: 0.73 mg/dL (ref 0.44–1.00)
Calcium: 8.1 mg/dL — ABNORMAL LOW (ref 8.9–10.3)
GFR calc non Af Amer: >60 mL/min (ref >60)
Glucose, Bld: 81 mg/dL (ref 65–99)
Potassium: 3.4 mmol/L — ABNORMAL LOW (ref 3.5–5.1)
Sodium: 140 mmol/L (ref 135–145)

## 2015-03-08 LAB — VANCOMYCIN, TROUGH: Vancomycin Tr: 17 ug/mL (ref 10.0–20.0)

## 2015-03-08 LAB — CULTURE, ROUTINE-SINUS

## 2015-03-08 LAB — MAGNESIUM: MAGNESIUM: 2 mg/dL (ref 1.7–2.4)

## 2015-03-08 MED ORDER — FLUCONAZOLE IN SODIUM CHLORIDE 200-0.9 MG/100ML-% IV SOLN
200.0000 mg | Freq: Once | INTRAVENOUS | Status: AC
  Filled 2015-03-08: qty 100

## 2015-03-08 MED ORDER — DEXTROSE 5 % IV SOLN
5.0000 mg/kg | Freq: Three times a day (TID) | INTRAVENOUS | Status: AC
  Administered 2015-03-08 – 2015-03-11 (×8): 375 mg via INTRAVENOUS
  Filled 2015-03-08 (×11): qty 7.5

## 2015-03-08 MED ORDER — FLUCONAZOLE IN SODIUM CHLORIDE 200-0.9 MG/100ML-% IV SOLN
200.0000 mg | INTRAVENOUS | Status: AC
  Administered 2015-03-08 – 2015-03-14 (×7): 200 mg via INTRAVENOUS
  Filled 2015-03-08 (×8): qty 100

## 2015-03-08 MED ORDER — POTASSIUM CHLORIDE CRYS ER 20 MEQ PO TBCR
40.0000 meq | EXTENDED_RELEASE_TABLET | Freq: Once | ORAL | Status: AC
  Administered 2015-03-08: 40 meq via ORAL
  Filled 2015-03-08: qty 2

## 2015-03-08 MED ORDER — LIDOCAINE VISCOUS 2 % MT SOLN
15.0000 mL | OROMUCOSAL | Status: DC
  Administered 2015-03-08 – 2015-03-18 (×42): 15 mL via OROMUCOSAL
  Filled 2015-03-08 (×90): qty 15

## 2015-03-08 NOTE — Progress Notes (Addendum)
Name: Bianca Kent MRN: [id number] DOB: Sep 20, 1966    ADMISSION DATE:  03/03/2015 CONSULTATION DATE:  5/14  REFERRING MD :  Erlinda Hong  CHIEF COMPLAINT/reason for consult: hypoxia/SIRS (possible sepsis)  BRIEF PATIENT DESCRIPTION: This is a 49 year old female w/ sig history of metastatic breast cancer w/ mets to brain (followed by [facility]). Last partial dose chemo 5/5, last xrt to left temporal area Feb 2016. Had remote h/o sinus surgery from which she had CSF leak w/ prior repair. 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 Surgery was uneventful but she was kept in the hospital over night as she had post-op sats in low 90s and needed pain management. Developed fever and had asymptomatic hypoxia w/ room air sats 70s on 5/13. Given IVFs, ABX widened. PCCM asked to see the following day for hypoxia and concern for sepsis. Found to have ATX thought due to suspected OSA, nasal packing, and possibly pain medications.   SIGNIFICANT EVENTS  5/11: bilateral sinus surgery and Draf III sinusotomy w/ removal of fronto-ethmoid mucocele  STUDIES:  Upper extremity doppler 5/15> No evidence upper extremity DVT Echo 5/16 >>>  SUBJECTIVE: Extremely fatigued and feels SOB on 60% face tent. States that with even minimal activity her O2 sats drop down into the 80's. Has been using IS, but is frustrated that she cannot get higher than 600. Non-productive cough.  VITAL SIGNS: Temp:  [97.5 F (36.4 C)-99.3 F (37.4 C)] 97.5 F (36.4 C) (05/16 0536) Pulse Rate:  [79-86] 81 (05/16 0536) Resp:  [16-18] 18 (05/16 0536) BP: (94-108)/(52-67) 108/52 mmHg (05/16 0536) SpO2:  [91 %-94 %] 91 % (05/16 0837) FiO2 (%):  [60 %] 60 % (05/16 0837)  PHYSICAL EXAMINATION: General:  49 year old female, s/p sinus surgery, currently not in any distress.  Neuro:  Awake, alert, oriented. No focal def  HEENT:  + facial  edema, sinus packing. Otherwise nml cephalic  Cardiovascular:  rrr Lungs:  Crackles both bases L > R, no accessory muscle use  Abdomen:  Soft, non-tender + bowel sounds  Musculoskeletal:  Intact  Skin:  Trace UE edema    Recent Labs Lab 03/06/15 0425 03/07/15 0436 03/08/15 0145  NA 138 138 140  K 3.6 3.4* 3.4*  CL 104 104 105  CO2 28 26 27   BUN <5* 5* 5*  CREATININE 0.77 0.78 0.73  GLUCOSE 92 87 81    Recent Labs Lab 03/06/15 0425 03/07/15 0436 03/08/15 0145  HGB 8.9* 8.6* 8.5*  HCT 27.7* 26.7* 26.6*  WBC 7.2 6.5 6.5  PLT 98* 87* 106*   ABG    Component Value Date/Time   PHART 7.376 03/06/2015 1440   PCO2ART 46.3* 03/06/2015 1440   PO2ART 61.2* 03/06/2015 1440   HCO3 26.5* 03/06/2015 1440   TCO2 27.9 03/06/2015 1440   ACIDBASEDEF 1.0 08/11/2007 2259   O2SAT 89.7 03/06/2015 1440    Dg Chest 2 View  03/08/2015   CLINICAL DATA:  Hypoxia.  EXAM: CHEST  2 VIEW  COMPARISON:  Mar 06, 2015  FINDINGS: Stable cardiomediastinal silhouette. Left subclavian Port-A-Cath is again noted with distal tip in the expected position of the SVC. Hypoinflation of the lungs is again noted. Mildly increased bibasilar opacities are noted most consistent with subsegmental atelectasis. No pneumothorax is noted. Possible mild right pleural effusion cannot be excluded. Stable elevation of right hemidiaphragm is noted.  IMPRESSION: Hypoinflation of  the lungs is again noted. Mildly increased bibasilar subsegmental atelectasis is noted. Possible mild right pleural effusion is noted.   Electronically Signed   By: Marijo Conception, M.D.   On: 03/08/2015 08:03   Dg Chest Port 1 View  03/06/2015   CLINICAL DATA:  Acute respiratory failure, short of breath.  EXAM: PORTABLE CHEST - 1 VIEW  COMPARISON:  Radiograph 03/05/2015  FINDINGS: Left car port unchanged. There are low lung volumes and bibasilar atelectasis again demonstrated. Chronic elevation of the right hemidiaphragm. Right basilar atelectasis.   IMPRESSION: No interval change. Bibasilar atelectasis. Cannot exclude left lower lobe infiltrate.   Electronically Signed   By: Suzy Bouchard M.D.   On: 03/06/2015 15:35    ASSESSMENT / PLAN:  Acute respiratory failure secondary to significant atelectasis and immobility. ?PNA ?PE > BUE edema, dopplers negative. Doubt in setting hypoinflation and ATX on CXR -continue aggressive pulmonary hygiene -Supplemental O2 with face tent to maintain SpO2 > 92% -Continue empiric abx for now. -Trend PCT  -Consider VQ  OSA - Unfortunately the pt is not able to wear cpap post sinus surgery. Can use supplemental oxygen by face tent during sleep. - Consider minimizing her sedating medications if she can tolerate this  Stage IV breast Ca with brain mets Thrombocytompenia - Per primary  Georgann Housekeeper, AGACNP-BC [facility] Pager [phone] or [phone]  03/08/2015 17:65 PM  49 year old female with recent sinus surgery who developed hypoxemic respiratory failure.  Developed significant atelectasis and concern for bibasilar infiltrate.  I reviewed CXR myself, hypoinflated, bibasilar atelectasis.  Hypoxemia: due to bibasilar atelectasis, seriously doubt a PE here, hypoxemia easily explainable with low volume and atelectasis.  - Supplemental O2 via tent face mask.  - Titrate O2 for sat of 88-92%.  - Will monitor.  Atelectasis: due to lack of mobility.  - IS per RT protocol.  - OOB to chair.  - Ambulate aggressively with PT.  OSA:  - No CPAP for now.  - Supplemental O2 only.  HCAP:   - Continue vanc and zosyn.  - F/U on cultures.  Patient seen and examined, agree with above note.  I dictated the care and orders written for this patient under my direction.  Rush Farmer, MD [phone]

## 2015-03-08 NOTE — Progress Notes (Signed)
  Echocardiogram 2D Echocardiogram has been performed.  Bianca Kent 03/08/2015, 11:28 AM

## 2015-03-08 NOTE — Progress Notes (Signed)
ANTIBIOTIC CONSULT NOTE - INITIAL  Pharmacy Consult for Vancomycin  Indication: rule out sepsis  Labs:  Recent Labs  03/06/15 0425 03/07/15 0436 03/08/15 0145  WBC 7.2 6.5 6.5  HGB 8.9* 8.6* 8.5*  PLT 98* 87* 106*  CREATININE 0.77 0.78 0.73   Estimated Creatinine Clearance: 90.1 mL/min (by C-G formula based on Cr of 0.73).  Recent Labs  03/07/15 1737 03/08/15 0145  VANCOTROUGH 27* 17    Assessment: Therapeutic vancomycin trough (drawn correctly)  Goal of Therapy:  Vancomycin trough level 15-20 mcg/ml  Plan:  -Continue vancomycin 750 mg IV q8h -Repeat VT as needed  Narda Bonds 03/08/2015,4:31 AM

## 2015-03-08 NOTE — Progress Notes (Signed)
PROGRESS NOTE  Bianca Kent TKW:[id number] DOB: 12-Apr-1966 DOA: 03/03/2015 PCP: [name], MD  HPI/Recap of past 24 hours:   No fever last 24hrs, c/o oral thrush and odynophagia, no bmx1week declined enema  Mother , sister and brother in law in room, reported mental status back to  Baseline.   Reported less facial pain  Assessment/Plan: Active Problems:   Post-operative state   Acute respiratory failure with hypoxia   Postprocedural fever   Breast cancer, stage 4   Thrombocytopenia   Confusion   Hypoxemia   HCAP (healthcare-associated pneumonia)  Fever/hypoxia/acute respiratory failure/confusion: Broad differential   Infection? from sinus? CNS? PNA? Patient currently denies headache, reported facial pain has improved from surgery. US unremarkable, blood culture pending, no cough. Lung exam, left lower lobe crackles. CT chest was cancelled by CCM, repeat cxr on 5/16, continue oxygen supplement. Continue empiric abx with vanc/zosyn. Mother states mental status better today, Ct head cancelled by CCM.  Fever subsided, continue abx, awaiting culture result.  Clotting?  Patient does has high risk of DVT or PE in active cancer,  Patient reported bilateral arm edema, no lower extremity edema, no chest pain, no cough. Not a candidate for CTA due to dye allergy. upper extremity US no DVT.  Continue SCD's prophylaxis lovenox on 5/15 stopped due to worsening of thrombocytopenia. Resume when plt stablized.  Oral thrush and odynophagia, start iv diflucan, continue topical nystatin.  Thrombocytopenia: from chemo vs infection vs consumption. Continue monitor.   Metastatic breast cancer with brain meds:  Reported has not been getting full dose chemo due to recent illness, followed by Dr. [name].  S/p bilateral sinus surgery and Draf III frontal sinusotomy 03/03/2015. ENT following.  Constipation: stool softener. Declined enema.    Code Status: full  Family Communication:  patient, mother at bedside. (mother was a surgical ICU RN for 36yrs at [employer] [employer])  Disposition Plan: remain in patient,    Consultants:  PCCM  ENT  Procedures: bilateral sinus surgery and Draf III frontal sinusotomy 03/03/2015  Antibiotics:  Vanc/zosyn from 5/13   Objective: BP 94/67 mmHg  Pulse 88  Temp(Src) 98.7 F (37.1 C) (Axillary)  Resp 18  Ht 5\' 7"  (1.702 m)  Wt 75.297 kg (166 lb)  BMI 25.99 kg/m2  SpO2 97%  LMP 04/22/2014  Intake/Output Summary (Last 24 hours) at 03/08/15 1828 Last data filed at 03/08/15 0845  Gross per 24 hour  Intake   1990 ml  Output      0 ml  Net   1990 ml   Filed Weights   03/03/15 1052  Weight: 75.297 kg (166 lb)    Exam:   General:  Chronically ill, less anxious, s/p sinus surgery, no visible drainage or blood.  Cardiovascular: RRR  Respiratory: crackles left lower lobe, no wheezing, no rhonchi.  Abdomen: Soft/ND/NT, positive BS  Musculoskeletal: bilateral upper arm edema? Nonpitting, no edema lower extremity.  Neuro: AAOX4.  Data Reviewed: Basic Metabolic Panel:  Recent Labs Lab 03/05/15 1500 03/06/15 0425 03/07/15 0436 03/08/15 0145  NA 136 138 138 140  K 3.2* 3.6 3.4* 3.4*  CL 99* 104 104 105  CO2 27 28 26 27   GLUCOSE 130* 92 87 81  BUN <5* <5* 5* 5*  CREATININE 0.85 0.77 0.78 0.73  CALCIUM 8.7* 8.1* 8.0* 8.1*  MG  --   --   --  2.0   Liver Function Tests:  Recent Labs Lab 03/05/15 1500 03/06/15 0425  AST 60* 41  ALT  51 38  ALKPHOS 278* 238*  BILITOT 0.7 0.4  PROT 6.4* 6.3*  ALBUMIN 2.8* 2.5*   No results for input(s): LIPASE, AMYLASE in the last 168 hours. No results for input(s): AMMONIA in the last 168 hours. CBC:  Recent Labs Lab 03/05/15 1500 03/06/15 0425 03/07/15 0436 03/08/15 0145  WBC 8.7 7.2 6.5 6.5  HGB 9.9* 8.9* 8.6* 8.5*  HCT 30.2* 27.7* 26.7* 26.6*  MCV 85.8 86.6 87.8 87.5  PLT 102* 98* 87* 106*   Cardiac Enzymes:   No results for input(s): CKTOTAL, CKMB,  CKMBINDEX, TROPONINI in the last 168 hours. BNP (last 3 results)  Recent Labs  03/05/15 1851  BNP 48.8    ProBNP (last 3 results) No results for input(s): PROBNP in the last 8760 hours.  CBG: No results for input(s): GLUCAP in the last 168 hours.  Recent Results (from the past 240 hour(s))  Culture, routine-sinus     Status: None   Collection Time: 03/03/15  3:58 PM  Result Value Ref Range Status   Specimen Description NASAL SWAB  Final   Special Requests NONE  Final   Culture   Final    FEW VIRIDANS STREPTOCOCCUS Note: SOURCE IS SINUS Performed at [facility]    Report Status 03/08/2015 FINAL  Final  MRSA PCR Screening     Status: None   Collection Time: 03/03/15 10:24 PM  Result Value Ref Range Status   MRSA by PCR NEGATIVE NEGATIVE Final    Comment:        The GeneXpert MRSA Assay (FDA approved for NASAL specimens only), is one component of a comprehensive MRSA colonization surveillance program. It is not intended to diagnose MRSA infection nor to guide or monitor treatment for MRSA infections.   Culture, blood (routine x 2)     Status: None (Preliminary result)   Collection Time: 03/05/15  3:00 PM  Result Value Ref Range Status   Specimen Description BLOOD LEFT HAND  Final   Special Requests BOTTLES DRAWN AEROBIC AND ANAEROBIC 10CC  Final   Culture   Final           BLOOD CULTURE RECEIVED NO GROWTH TO DATE CULTURE WILL BE HELD FOR 5 DAYS BEFORE ISSUING A FINAL NEGATIVE REPORT Performed at [facility]    Report Status PENDING  Incomplete  Culture, blood (routine x 2)     Status: None (Preliminary result)   Collection Time: 03/05/15  3:10 PM  Result Value Ref Range Status   Specimen Description BLOOD RIGHT HAND  Final   Special Requests Reviewed by Louanne Belton. Rodney Cruise, M.D. [facility]  Final   Culture   Final           BLOOD CULTURE RECEIVED NO GROWTH TO DATE CULTURE WILL BE HELD FOR 5 DAYS BEFORE ISSUING A FINAL NEGATIVE REPORT Note: Culture  results may be compromised due to an inadequate volume of blood received in culture bottles. Performed at [facility]    Report Status PENDING  Incomplete  Urine culture     Status: None   Collection Time: 03/05/15  7:00 PM  Result Value Ref Range Status   Specimen Description URINE, CLEAN CATCH  Final   Special Requests NONE  Final   Colony Count NO GROWTH Performed at [facility]   Final   Culture NO GROWTH Performed at [facility]   Final   Report Status 03/06/2015 FINAL  Final     Studies: No results found.  Scheduled Meds: . acidophilus  2 capsule Oral BID  . antiseptic oral rinse  7 mL Mouth Rinse BID  . calcium carbonate  2 tablet Oral BID  . dronabinol  10 mg Oral TID AC  . fentaNYL  50 mcg Transdermal Q72H  . lactobacillus  1 g Oral TID WC  . methylphenidate  10 mg Oral BID  . multivitamin with minerals  1 tablet Oral Daily  . nystatin  5 mL Oral QID  . OLANZapine  5 mg Oral QHS  . pantoprazole  40 mg Oral Daily  . piperacillin-tazobactam (ZOSYN)  IV  3.375 g Intravenous Q8H  . polyethylene glycol  17 g Oral Daily  . scopolamine  1 patch Transdermal Q72H  . senna-docusate  2 tablet Oral BID  . sodium chloride  10-40 mL Intracatheter Q12H  . vancomycin  750 mg Intravenous Q8H  . Vitamin D (Ergocalciferol)  50,000 Units Oral Q Thu    Continuous Infusions: . sodium chloride 0.9 % 1,000 mL with potassium chloride 20 mEq infusion 100 mL/hr at 03/08/15 1620     Time spent: 67mins  Dashawna Delbridge MD, PhD  [facility] Pager [phone]. If 7PM-7AM, please contact night-coverage at [email or web address], password [facility] 03/08/2015, 6:28 PM  LOS: 5 days

## 2015-03-09 ENCOUNTER — Ambulatory Visit: Payer: 59 | Admitting: Physical Therapy

## 2015-03-09 DIAGNOSIS — J9600 Acute respiratory failure, unspecified whether with hypoxia or hypercapnia: Secondary | ICD-10-CM | POA: Insufficient documentation

## 2015-03-09 DIAGNOSIS — J189 Pneumonia, unspecified organism: Principal | ICD-10-CM

## 2015-03-09 LAB — CBC
HEMATOCRIT: 25.9 % — AB (ref 36.0–46.0)
Hemoglobin: 8.3 g/dL — ABNORMAL LOW (ref 12.0–15.0)
MCH: 28.3 pg (ref 26.0–34.0)
MCHC: 32 g/dL (ref 30.0–36.0)
MCV: 88.4 fL (ref 78.0–100.0)
PLATELETS: 98 10*3/uL — AB (ref 150–400)
RBC: 2.93 MIL/uL — ABNORMAL LOW (ref 3.87–5.11)
RDW: 18.5 % — AB (ref 11.5–15.5)
WBC: 4.7 10*3/uL (ref 4.0–10.5)

## 2015-03-09 LAB — MAGNESIUM: Magnesium: 2 mg/dL (ref 1.7–2.4)

## 2015-03-09 LAB — BASIC METABOLIC PANEL
ANION GAP: 5 (ref 5–15)
BUN: <5 mg/dL — ABNORMAL LOW (ref 6–20)
CO2: 28 mmol/L (ref 22–32)
CREATININE: 0.85 mg/dL (ref 0.44–1.00)
Calcium: 8 mg/dL — ABNORMAL LOW (ref 8.9–10.3)
Chloride: 107 mmol/L (ref 101–111)
GFR calc non Af Amer: >60 mL/min (ref >60)
Glucose, Bld: 80 mg/dL (ref 65–99)
Potassium: 3.8 mmol/L (ref 3.5–5.1)
Sodium: 140 mmol/L (ref 135–145)

## 2015-03-09 LAB — PROCALCITONIN: PROCALCITONIN: 0.13 ng/mL

## 2015-03-09 MED ORDER — ENSURE ENLIVE PO LIQD
237.0000 mL | Freq: Three times a day (TID) | ORAL | Status: DC
  Administered 2015-03-09 – 2015-03-16 (×20): 237 mL via ORAL

## 2015-03-09 MED ORDER — FUROSEMIDE 10 MG/ML IJ SOLN
40.0000 mg | Freq: Once | INTRAMUSCULAR | Status: AC
  Administered 2015-03-09: 40 mg via INTRAVENOUS
  Filled 2015-03-09: qty 4

## 2015-03-09 MED ORDER — DM-GUAIFENESIN ER 30-600 MG PO TB12
1.0000 | ORAL_TABLET | Freq: Two times a day (BID) | ORAL | Status: DC
  Administered 2015-03-09 – 2015-03-15 (×14): 1 via ORAL
  Filled 2015-03-09 (×17): qty 1

## 2015-03-09 NOTE — Evaluation (Signed)
Physical Therapy Evaluation Patient Details Name: Bianca Kent MRN: [id number] DOB: Jan 24, 1966 Today's Date: 03/09/2015   History of Present Illness  Patient is a 49 yo female with complex medical history significant for metastatic breast cancer with mets to the brain undergoing chemotherapy. Patient is s/p bilateral sinus surgery and Draf III sinusotomy w/ removal of fronto-ethmoid mucocele on 5/11 afterwhich patient developed hypoxia and acute respiroatory failure secondary to significant atelectasis.  Clinical Impression  Patient demonstrates deficits in functional mobility as indicated below. Will need continued skilled PT to address deficits and maximize function. Will see as indicated and progress as tolerated.  OF NOTE: patient easily fatigued with some dizziness during ambulation. Also continued to report some distorted vision but much improved compared to initial presentation. Dizziness resolved with rest, may need to consider use of RW for ambulation cues.    Follow Up Recommendations Home health PT;Supervision/Assistance - 24 hour;Supervision for mobility/OOB    Equipment Recommendations  Rolling walker with 5" wheels    Recommendations for Other Services       Precautions / Restrictions Precautions Precautions: Fall Restrictions Weight Bearing Restrictions: No      Mobility  Bed Mobility               General bed mobility comments: received in chair  Transfers Overall transfer level: Needs assistance Equipment used: None Transfers: Sit to/from Stand Sit to Stand: Min guard         General transfer comment: min guard for stability  Ambulation/Gait Ambulation/Gait assistance: Min guard;Min assist Ambulation Distance (Feet): 80 Feet (x2 with seated rest break secondary to dizziness) Assistive device: 1 person hand held assist;None Gait Pattern/deviations: Step-through pattern;Decreased stride length;Drifts right/left Gait velocity: decreased Gait  velocity interpretation: Below normal speed for age/gender General Gait Details: some instability noted, easily fatigued with reports of some dizziness during ambulation. Seated rest break required.  Stairs            Wheelchair Mobility    Modified Rankin (Stroke Patients Only)       Balance Overall balance assessment: Needs assistance Sitting-balance support: Feet supported Sitting balance-Leahy Scale: Good     Standing balance support: During functional activity Standing balance-Leahy Scale: Fair                               Pertinent Vitals/Pain Pain Assessment: 0-10 Pain Score: 4  Pain Location: face Pain Descriptors / Indicators: Pressure;Sore Pain Intervention(s): Monitored during session    Home Living Family/patient expects to be discharged to:: Private residence Living Arrangements: Alone Available Help at Discharge: Family;Available 24 hours/day;Available PRN/intermittently Type of Home: House Home Access: Stairs to enter   [employer] of Steps: 3 Home Layout: Two level Home Equipment: None      Prior Function Level of Independence: Independent               Hand Dominance   Dominant Hand: Right    Extremity/Trunk Assessment   Upper Extremity Assessment: Defer to OT evaluation (UE deficits being treated at outpatient)           Lower Extremity Assessment: Generalized weakness;RLE deficits/detail;LLE deficits/detail         Communication   Communication: No difficulties  Cognition Arousal/Alertness: Awake/alert Behavior During Therapy: WFL for tasks assessed/performed Overall Cognitive Status: Within Functional Limits for tasks assessed (Patient reports AMS in the evenings)  General Comments      Exercises        Assessment/Plan    PT Assessment Patient needs continued PT services  PT Diagnosis Generalized weakness;Acute pain   PT Problem List Decreased  strength;Decreased activity tolerance;Decreased balance;Decreased mobility;Cardiopulmonary status limiting activity;Pain  PT Treatment Interventions DME instruction;Gait training;Stair training;Functional mobility training;Therapeutic activities;Therapeutic exercise;Balance training;Patient/family education   PT Goals (Current goals can be found in the Care Plan section) Acute Rehab PT Goals Patient Stated Goal: to go home PT Goal Formulation: With patient Time For Goal Achievement: 03/23/15 Potential to Achieve Goals: Good    Frequency Min 3X/week   Barriers to discharge        Co-evaluation               End of Session Equipment Utilized During Treatment: Gait belt;Oxygen Activity Tolerance: Patient tolerated treatment well;Patient limited by fatigue Patient left: in chair;with call bell/phone within reach;with family/visitor present Nurse Communication: Mobility status         Time: 4628-6381 PT Time Calculation (min) (ACUTE ONLY): 19 min   Charges:   PT Evaluation $Initial PT Evaluation Tier I: 1 Procedure     PT G CodesDuncan Dull 08-Apr-2015, 2:46 PM  Alben Deeds, [place] DPT  [phone]

## 2015-03-09 NOTE — Progress Notes (Signed)
Initial Nutrition Assessment  DOCUMENTATION CODES:  Not applicable  INTERVENTION:  Ensure Enlive (each supplement provides 350kcal and 20 grams of protein), MVI  NUTRITION DIAGNOSIS:  Inadequate oral intake related to other (see comment), mouth pain (thrush) as evidenced by meal completion < 25%.   GOAL:  Patient will meet greater than or equal to 90% of their needs   MONITOR:  PO intake, Supplement acceptance, Labs, Weight trends, Skin, I & O's  REASON FOR ASSESSMENT:  Consult Assessment of nutrition requirement/status  ASSESSMENT: This is a 48 year old with breast cancer and history of bilateral sinus surgery with left cribriform CSF leak repaired with fat who presents with left frontoethmoid mucocele, chronic sinusitis. She now presents for revision bilateral sinus surgery and Draf III frontal sinusotomy. Dr. [name], [place] has discussed the risks (bleeding, infection, orbital injury/blindness, CSF leak, scarring, need for further surgery, etc.), benefits, and alternatives of this procedure. The patient understands the risks and would like to proceed with the procedure. The chances of success of the procedure are >50% and the patient understands this. I personally performed an examination of the patient within 24 hours of the procedure.  S/p Procedures on 03/03/15:  revision bilateral sinus surgery, Draf 3 frontal sinusotomy, and removal of left fronto-ethmoid mucocele  Spoke with RN who reports pt is medically complex and suspects pt has not been eating well.   Hx obtained by pt and pt mother at bedside. Both report decreased appetite since hospitalization. They report wt loss since cancer diagnosis and treatments (inclusing a 11.7% wt loss over the past 6 months), however weight has been stable since January. Pt mother is concerned about poor oral intake and is concerned about pt losing more weight. Mother also reveals that she has been trying to encourage pt to see an RD  at the [facility] in [facility]. [place] notes, pt wt has been stable with the assistance of an appetite stimulant.   Pt reports that she has extreme moth pain due to thrush. She reveals that is is difficult to chew and has extreme esophageal pain. Meal completion 0-50%. Pt consumed only her fruit cup at lunch. She reports that her hamburger was too hard to chew and could not tolerate salad dressing on her salad. Pt mother adds that pt consumes mostly water at home. She also consumes Premier Protein shakes at home, but expresses that she knows they are not available here. Reviewed available supplements on formulary and pt is amenable to Ensure. Offered diet downgrade due to jaw pain for ease of intake, but pt refused at this time.   Pt is very knowledgeable about importance of protein intake and nutritional adequacy. Both pt and mother expressed gratefulness for visit.   Labs reviewed. K: 3.4, BUN: 5, Calcium: 8.1.   Height:  Ht Readings from Last 1 Encounters:  03/03/15 5\' 7"  (1.702 m)    Weight:  Wt Readings from Last 1 Encounters:  03/03/15 166 lb (75.297 kg)    Ideal Body Weight:  61.4 kg  Wt Readings from Last 10 Encounters:  03/03/15 166 lb (75.297 kg)  02/25/15 166 lb (75.297 kg)  02/22/15 165 lb (74.844 kg)  02/12/15 164 lb (74.39 kg)  01/07/15 162 lb (73.483 kg)  12/17/14 164 lb (74.39 kg)  12/11/14 165 lb (74.844 kg)  11/26/14 164 lb 12.8 oz (74.753 kg)  11/19/14 165 lb (74.844 kg)  10/08/14 178 lb (80.74 kg)    BMI:  Body mass index is 25.99 kg/(m^2).  Estimated Nutritional Needs:  Kcal:  2050-2250  Protein:  95-105 grams  Fluid:  2.0-2.2 L  Skin:  Reviewed, no issues (closed bilateral nose incision)  Diet Order:  Diet regular Room service appropriate?: Yes; Fluid consistency:: Thin  EDUCATION NEEDS:  No education needs identified at this time   Intake/Output Summary (Last 24 hours) at 03/09/15 1440 Last data filed at 03/09/15 1225   Gross per 24 hour  Intake   3095 ml  Output      0 ml  Net   3095 ml    Last BM:  03/08/16  Jonothan Heberle A. Jimmye Norman, RD, LDN, CDE Pager: [phone] After hours Pager: [phone]

## 2015-03-09 NOTE — Progress Notes (Signed)
PROGRESS NOTE  Bianca Kent LEX:[id number] DOB: Mar 13, 1966 DOA: 03/03/2015 PCP: [name], MD  HPI/Recap of past 24 hours:   Fever subsided, still on facemask, but oxygen saturation has improved, feeling better,mental status back to baseline. Continue to c/o oral thrush and odynophagia, but better with diflucan and acyclovir. C/o generalized swelling, no bmx1week declined enema   sister in room..   Reported less facial pain  Assessment/Plan: Active Problems:   Post-operative state   Acute respiratory failure with hypoxia   Postprocedural fever   Breast cancer, stage 4   Thrombocytopenia   Confusion   Hypoxemia   HCAP (healthcare-associated pneumonia)  Fever/hypoxia/acute respiratory failure/confusion: Broad differential   Infection? from sinus? CNS? PNA? Patient denies headache, reported facial pain has improved from surgery. UA unremarkable, blood culture no growth, sinus tissue culture + strep viridans, no cough. Lung exam, persistent left lower lobe crackles.  CT chest was cancelled by CCM, repeat cxr on 5/18 per pccm. wean oxygen. Continue empiric abx with vanc/zosyn. mental status back to baseline.  Fever resolved, continue abx, awaiting culture result.  Edema: likely third spacing, d/c ivf on 5/17, lasix 40mg  iv x1 on 5/17. Patient does has high risk of DVT or PE in active cancer,  Patient reported bilateral arm edema , no lower extremtiy edema on presentation,  no chest pain, no cough. Not a candidate for CTA due to dye allergy. upper extremity US no DVT.  Continue SCD's prophylaxis lovenox on 5/15 stopped due to worsening of thrombocytopenia. Resume when plt stablized.  Oral thrush and odynophagia, start iv diflucan, acyclovir (started by oncologist Dr. [name])  Thrombocytopenia: from chemo vs infection vs consumption. Continue monitor.   Metastatic breast cancer with brain meds:  Reported has not been getting full dose chemo due to recent illness,  followed by Dr. [name].  S/p bilateral sinus surgery and Draf III frontal sinusotomy 03/03/2015. Per ENT   Constipation: stool softener. Declined enema.    Code Status: full  Family Communication: patient, sister at bedside. (mother was a surgical ICU RN for 61yrs at [employer] [facility] [employer], patient is also a [employer] employee piror to cancer diagnosis)  Disposition Plan: remain in patient, likely d/c mid week.   Consultants:  PCCM  ENT  Procedures: bilateral sinus surgery and Draf III frontal sinusotomy 03/03/2015  Antibiotics:  Vanc/zosyn from 5/13   Objective: BP 111/60 mmHg  Pulse 97  Temp(Src) 97.5 F (36.4 C) (Axillary)  Resp 20  Ht 5\' 7"  (1.702 m)  Wt 75.297 kg (166 lb)  BMI 25.99 kg/m2  SpO2 97%  LMP 04/22/2014  Intake/Output Summary (Last 24 hours) at 03/09/15 1637 Last data filed at 03/09/15 1225  Gross per 24 hour  Intake   3095 ml  Output      0 ml  Net   3095 ml   Filed Weights   03/03/15 1052  Weight: 75.297 kg (166 lb)    Exam:   General:  Chronically ill, less anxious, s/p sinus surgery, no visible drainage or blood.  Cardiovascular: RRR  Respiratory: crackles left lower lobe, no wheezing, no rhonchi.  Abdomen: Soft/ND/NT, positive BS  Musculoskeletal: bilateral upper arm edema? Nonpitting, now bilateral edema lower extremity on 5/17 which is new.  Neuro: AAOX4.  Data Reviewed: Basic Metabolic Panel:  Recent Labs Lab 03/05/15 1500 03/06/15 0425 03/07/15 0436 03/08/15 0145 03/09/15 0520  NA 136 138 138 140 140  K 3.2* 3.6 3.4* 3.4* 3.8  CL 99* 104 104 105 107  CO2  27 28 26 27 28   GLUCOSE 130* 92 87 81 80  BUN <5* <5* 5* 5* <5*  CREATININE 0.85 0.77 0.78 0.73 0.85  CALCIUM 8.7* 8.1* 8.0* 8.1* 8.0*  MG  --   --   --  2.0 2.0   Liver Function Tests:  Recent Labs Lab 03/05/15 1500 03/06/15 0425  AST 60* 41  ALT 51 38  ALKPHOS 278* 238*  BILITOT 0.7 0.4  PROT 6.4* 6.3*  ALBUMIN 2.8* 2.5*   No results for input(s):  LIPASE, AMYLASE in the last 168 hours. No results for input(s): AMMONIA in the last 168 hours. CBC:  Recent Labs Lab 03/05/15 1500 03/06/15 0425 03/07/15 0436 03/08/15 0145 03/09/15 0520  WBC 8.7 7.2 6.5 6.5 4.7  HGB 9.9* 8.9* 8.6* 8.5* 8.3*  HCT 30.2* 27.7* 26.7* 26.6* 25.9*  MCV 85.8 86.6 87.8 87.5 88.4  PLT 102* 98* 87* 106* 98*   Cardiac Enzymes:   No results for input(s): CKTOTAL, CKMB, CKMBINDEX, TROPONINI in the last 168 hours. BNP (last 3 results)  Recent Labs  03/05/15 1851  BNP 48.8    ProBNP (last 3 results) No results for input(s): PROBNP in the last 8760 hours.  CBG: No results for input(s): GLUCAP in the last 168 hours.  Recent Results (from the past 240 hour(s))  Culture, routine-sinus     Status: None   Collection Time: 03/03/15  3:58 PM  Result Value Ref Range Status   Specimen Description NASAL SWAB  Final   Special Requests NONE  Final   Culture   Final    FEW VIRIDANS STREPTOCOCCUS Note: SOURCE IS SINUS Performed at [facility]    Report Status 03/08/2015 FINAL  Final  MRSA PCR Screening     Status: None   Collection Time: 03/03/15 10:24 PM  Result Value Ref Range Status   MRSA by PCR NEGATIVE NEGATIVE Final    Comment:        The GeneXpert MRSA Assay (FDA approved for NASAL specimens only), is one component of a comprehensive MRSA colonization surveillance program. It is not intended to diagnose MRSA infection nor to guide or monitor treatment for MRSA infections.   Culture, blood (routine x 2)     Status: None (Preliminary result)   Collection Time: 03/05/15  3:00 PM  Result Value Ref Range Status   Specimen Description BLOOD LEFT HAND  Final   Special Requests BOTTLES DRAWN AEROBIC AND ANAEROBIC 10CC  Final   Culture   Final           BLOOD CULTURE RECEIVED NO GROWTH TO DATE CULTURE WILL BE HELD FOR 5 DAYS BEFORE ISSUING A FINAL NEGATIVE REPORT Performed at [facility]    Report Status PENDING  Incomplete    Culture, blood (routine x 2)     Status: None (Preliminary result)   Collection Time: 03/05/15  3:10 PM  Result Value Ref Range Status   Specimen Description BLOOD RIGHT HAND  Final   Special Requests Reviewed by Louanne Belton. Rodney Cruise, M.D. [facility]  Final   Culture   Final           BLOOD CULTURE RECEIVED NO GROWTH TO DATE CULTURE WILL BE HELD FOR 5 DAYS BEFORE ISSUING A FINAL NEGATIVE REPORT Note: Culture results may be compromised due to an inadequate volume of blood received in culture bottles. Performed at [facility]    Report Status PENDING  Incomplete  Urine culture     Status: None  Collection Time: 03/05/15  7:00 PM  Result Value Ref Range Status   Specimen Description URINE, CLEAN CATCH  Final   Special Requests NONE  Final   Colony Count NO GROWTH Performed at [facility]   Final   Culture NO GROWTH Performed at [facility]   Final   Report Status 03/06/2015 FINAL  Final     Studies: Dg Chest 2 View  03/08/2015   CLINICAL DATA:  Hypoxia.  EXAM: CHEST  2 VIEW  COMPARISON:  Mar 06, 2015  FINDINGS: Stable cardiomediastinal silhouette. Left subclavian Port-A-Cath is again noted with distal tip in the expected position of the SVC. Hypoinflation of the lungs is again noted. Mildly increased bibasilar opacities are noted most consistent with subsegmental atelectasis. No pneumothorax is noted. Possible mild right pleural effusion cannot be excluded. Stable elevation of right hemidiaphragm is noted.  IMPRESSION: Hypoinflation of the lungs is again noted. Mildly increased bibasilar subsegmental atelectasis is noted. Possible mild right pleural effusion is noted.   Electronically Signed   By: Marijo Conception, M.D.   On: 03/08/2015 08:03    Scheduled Meds: . acidophilus  2 capsule Oral BID  . acyclovir  5 mg/kg Intravenous 3 times per day  . antiseptic oral rinse  7 mL Mouth Rinse BID  . calcium carbonate  2 tablet Oral BID  . dextromethorphan-guaiFENesin  1  tablet Oral BID  . dronabinol  10 mg Oral TID AC  . feeding supplement (ENSURE ENLIVE)  237 mL Oral TID BM  . fentaNYL  50 mcg Transdermal Q72H  . fluconazole (DIFLUCAN) IV  200 mg Intravenous Q24H  . furosemide  40 mg Intravenous Once  . lactobacillus  1 g Oral TID WC  . lidocaine  15 mL Mouth/Throat Q4H  . methylphenidate  10 mg Oral BID  . multivitamin with minerals  1 tablet Oral Daily  . OLANZapine  5 mg Oral QHS  . pantoprazole  40 mg Oral Daily  . piperacillin-tazobactam (ZOSYN)  IV  3.375 g Intravenous Q8H  . polyethylene glycol  17 g Oral Daily  . scopolamine  1 patch Transdermal Q72H  . senna-docusate  2 tablet Oral BID  . sodium chloride  10-40 mL Intracatheter Q12H  . vancomycin  750 mg Intravenous Q8H  . Vitamin D (Ergocalciferol)  50,000 Units Oral Q Thu    Continuous Infusions:     Time spent: 63mins  Jceon Alverio MD, PhD  [facility] Pager [phone]. If 7PM-7AM, please contact night-coverage at [email or web address], password [facility] 03/09/2015, 4:37 PM  LOS: 6 days

## 2015-03-09 NOTE — Care Management Note (Signed)
Case Management Note  Patient Details  Name: Bianca Kent MRN: [id number] Date of Birth: [birth year]  Subjective/Objective:                    Action/Plan:   Expected Discharge Date:       03-12-15           Expected Discharge Plan:  [place]  In-House Referral:     Discharge planning Services     Post Acute Care Choice:    Choice offered to:     DME Arranged:    DME Agency:     HH Arranged:    [place] Agency:     Status of Service:  In process, will continue to follow  Medicare Important Message Given:    Date Medicare IM Given:    Medicare IM give by:    Date Additional Medicare IM Given:    Additional Medicare Important Message give by:     If discussed at [place] of Stay Meetings, dates discussed:  03-09-15   Additional Comments:  Marilu Favre, RN 03/09/2015, 8:07 AM

## 2015-03-09 NOTE — Progress Notes (Signed)
Name: Bianca Kent MRN: [id number] DOB: 1966/05/28    ADMISSION DATE:  03/03/2015 CONSULTATION DATE:  5/14  REFERRING MD :  Erlinda Hong  CHIEF COMPLAINT/reason for consult: hypoxia/SIRS (possible sepsis)  BRIEF PATIENT DESCRIPTION: This is a 49 year old female w/ sig history of metastatic breast cancer w/ mets to brain (followed by [facility]). Last partial dose chemo 5/5, last xrt to left temporal area Feb 2016. Had remote h/o sinus surgery from which she had CSF leak w/ prior repair. 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 Surgery was uneventful but she was kept in the hospital over night as she had post-op sats in low 90s and needed pain management. Developed fever and had asymptomatic hypoxia w/ room air sats 70s on 5/13. Given IVFs, ABX widened. PCCM asked to see the following day for hypoxia and concern for sepsis. Found to have ATX thought due to suspected OSA, nasal packing, and possibly pain medications.   SIGNIFICANT EVENTS  5/11: bilateral sinus surgery and Draf III sinusotomy w/ removal of fronto-ethmoid mucocele  STUDIES:  Upper extremity doppler 5/15> No evidence upper extremity DVT Echo 5/16 >>>  SUBJECTIVE: Feels "loopy" but better from a respiratory standpoint.  VITAL SIGNS: Temp:  [95.9 F (35.5 C)-98.7 F (37.1 C)] 97.5 F (36.4 C) (05/17 0524) Pulse Rate:  [77-94] 94 (05/17 1225) Resp:  [18-20] 20 (05/17 1225) BP: (88-110)/(52-67) 110/52 mmHg (05/17 0524) SpO2:  [97 %-98 %] 98 % (05/17 1225) FiO2 (%):  [40 %] 40 % (05/17 1225)  PHYSICAL EXAMINATION: General:  49 year old female, s/p sinus surgery, currently not in any distress.  Neuro:  Awake, alert, oriented. No focal def  Head: [place]/AT EENT:  + facial edema, sinus packing. Otherwise nml cephalic  Cardiovascular:  rrr Lungs:  Crackles both bases L > R but improved compared to 5/16, no accessory muscle  use  Abdomen:  Soft, non-tender + bowel sounds  Musculoskeletal:  Intact  Skin:  Trace UE edema   Recent Labs Lab 03/07/15 0436 03/08/15 0145 03/09/15 0520  NA 138 140 140  K 3.4* 3.4* 3.8  CL 104 105 107  CO2 26 27 28   BUN 5* 5* <5*  CREATININE 0.78 0.73 0.85  GLUCOSE 87 81 80    Recent Labs Lab 03/07/15 0436 03/08/15 0145 03/09/15 0520  HGB 8.6* 8.5* 8.3*  HCT 26.7* 26.6* 25.9*  WBC 6.5 6.5 4.7  PLT 87* 106* 98*   ABG    Component Value Date/Time   PHART 7.376 03/06/2015 1440   PCO2ART 46.3* 03/06/2015 1440   PO2ART 61.2* 03/06/2015 1440   HCO3 26.5* 03/06/2015 1440   TCO2 27.9 03/06/2015 1440   ACIDBASEDEF 1.0 08/11/2007 2259   O2SAT 89.7 03/06/2015 1440    Dg Chest 2 View  03/08/2015   CLINICAL DATA:  Hypoxia.  EXAM: CHEST  2 VIEW  COMPARISON:  Mar 06, 2015  FINDINGS: Stable cardiomediastinal silhouette. Left subclavian Port-A-Cath is again noted with distal tip in the expected position of the SVC. Hypoinflation of the lungs is again noted. Mildly increased bibasilar opacities are noted most consistent with subsegmental atelectasis. No pneumothorax is noted. Possible mild right pleural effusion cannot be excluded. Stable elevation of right hemidiaphragm is noted.  IMPRESSION: Hypoinflation of the lungs is again noted. Mildly increased bibasilar subsegmental atelectasis is noted. Possible mild right pleural effusion is noted.   Electronically Signed  By: Marijo Conception, M.D.   On: 03/08/2015 08:03    ASSESSMENT / PLAN:  Acute respiratory failure secondary to significant atelectasis and immobility. ?PNA ?PE > BUE edema, dopplers negative. Doubt in setting hypoinflation and ATX on CXR -Continue aggressive pulmonary hygiene -Supplemental O2 with face tent to maintain SpO2 > 92% -Continue empiric abx for now. -Trend PCT   OSA - Unfortunately the pt is not able to wear cpap post sinus surgery. Can use supplemental oxygen by face tent during sleep. - Consider  minimizing her sedating medications if she can tolerate this  Stage IV breast Ca with brain mets Thrombocytompenia - Per primary  49 year old female with recent sinus surgery who developed hypoxemic respiratory failure.  Developed significant atelectasis and concern for bibasilar infiltrate.  I reviewed CXR myself, hypoinflated, bibasilar atelectasis.  Hypoxemia: due to bibasilar atelectasis, seriously doubt a PE here specially with a normal echo, hypoxemia easily explainable with low volume and atelectasis.  - Supplemental O2 via tent face mask.  - Titrate O2 for sat of 88-92%.  - Will monitor.  - CXR ordered for AM.  Atelectasis: due to lack of mobility.  - IS per RT protocol.  - OOB to chair.  - Ambulate aggressively with PT.  OSA:  - No CPAP for now.  - Supplemental O2 only.  HCAP:   - Continue vanc and zosyn.  - F/U on cultures.  Discussed with NP and patient and her family.  Rush Farmer, M.D. [facility] Pulmonary/Critical Care Medicine. Pager: [phone]. After hours pager: [phone].

## 2015-03-10 ENCOUNTER — Ambulatory Visit: Payer: 59 | Admitting: Physical Therapy

## 2015-03-10 ENCOUNTER — Inpatient Hospital Stay (HOSPITAL_COMMUNITY): Payer: 59

## 2015-03-10 DIAGNOSIS — E8770 Fluid overload, unspecified: Secondary | ICD-10-CM

## 2015-03-10 DIAGNOSIS — E876 Hypokalemia: Secondary | ICD-10-CM

## 2015-03-10 LAB — BASIC METABOLIC PANEL
ANION GAP: 7 (ref 5–15)
BUN: 5 mg/dL — ABNORMAL LOW (ref 6–20)
CHLORIDE: 102 mmol/L (ref 101–111)
CO2: 32 mmol/L (ref 22–32)
CREATININE: 0.93 mg/dL (ref 0.44–1.00)
Calcium: 8.2 mg/dL — ABNORMAL LOW (ref 8.9–10.3)
GFR calc Af Amer: >60 mL/min (ref >60)
GFR calc non Af Amer: >60 mL/min (ref >60)
Glucose, Bld: 102 mg/dL — ABNORMAL HIGH (ref 65–99)
Potassium: 3 mmol/L — ABNORMAL LOW (ref 3.5–5.1)
SODIUM: 141 mmol/L (ref 135–145)

## 2015-03-10 LAB — CBC
HCT: 25.5 % — ABNORMAL LOW (ref 36.0–46.0)
Hemoglobin: 8.3 g/dL — ABNORMAL LOW (ref 12.0–15.0)
MCH: 28.2 pg (ref 26.0–34.0)
MCHC: 32.5 g/dL (ref 30.0–36.0)
MCV: 86.7 fL (ref 78.0–100.0)
PLATELETS: 124 10*3/uL — AB (ref 150–400)
RBC: 2.94 MIL/uL — AB (ref 3.87–5.11)
RDW: 18.4 % — ABNORMAL HIGH (ref 11.5–15.5)
WBC: 5.6 10*3/uL (ref 4.0–10.5)

## 2015-03-10 LAB — MAGNESIUM: Magnesium: 2.1 mg/dL (ref 1.7–2.4)

## 2015-03-10 LAB — OCCULT BLOOD X 1 CARD TO LAB, STOOL: FECAL OCCULT BLD: POSITIVE — AB

## 2015-03-10 MED ORDER — ALBUTEROL SULFATE (2.5 MG/3ML) 0.083% IN NEBU
2.5000 mg | INHALATION_SOLUTION | Freq: Three times a day (TID) | RESPIRATORY_TRACT | Status: DC
  Administered 2015-03-10 – 2015-03-13 (×7): 2.5 mg via RESPIRATORY_TRACT
  Filled 2015-03-10 (×7): qty 3

## 2015-03-10 MED ORDER — POTASSIUM CHLORIDE CRYS ER 20 MEQ PO TBCR
40.0000 meq | EXTENDED_RELEASE_TABLET | Freq: Three times a day (TID) | ORAL | Status: AC
  Administered 2015-03-10 (×2): 40 meq via ORAL
  Filled 2015-03-10 (×2): qty 2

## 2015-03-10 MED ORDER — FUROSEMIDE 10 MG/ML IJ SOLN
20.0000 mg | Freq: Three times a day (TID) | INTRAMUSCULAR | Status: AC
  Administered 2015-03-10 (×2): 20 mg via INTRAVENOUS
  Filled 2015-03-10 (×2): qty 2

## 2015-03-10 MED ORDER — ENOXAPARIN SODIUM 40 MG/0.4ML ~~LOC~~ SOLN
40.0000 mg | SUBCUTANEOUS | Status: DC
  Administered 2015-03-10 – 2015-03-21 (×12): 40 mg via SUBCUTANEOUS
  Filled 2015-03-10 (×13): qty 0.4

## 2015-03-10 NOTE — Progress Notes (Signed)
PROGRESS NOTE  Bianca Kent YCX:[id number] DOB: May 28, 1966 DOA: 03/03/2015 PCP: [name], MD  HPI: 49 year old female with a history of metastatic breast cancer to the brain (Dr. [name]), asthma, depression, GERD, was admitted on 03/03/2015 for elective outpatient sinus surgery. The patient underwent a left frontal ethmoid mucocele drainage performed by Dr. [name]. During the surgery, the patient also had her anterior Reform CSF leak from the mucocele repaired with cautery. Postoperatively, the patient has some hypoxemia, and therefore she was kept overnight. On postoperative day #2, the patient's oxygen saturations have somewhat improved and the patient was about to be discharged home. Unfortunately, the patient began developing fever and subsequently was found to have hypoxemia with oxygen saturations in the mid 70s on room air.   Subjective / 24 H Interval events - denies shortness of breath this morning, on mask   Assessment/Plan: Active Problems:   Post-operative state   Acute respiratory failure with hypoxia   Postprocedural fever   Breast cancer, stage 4   Thrombocytopenia   Confusion   Hypoxemia   HCAP (healthcare-associated pneumonia)   Acute respiratory failure   Acute hypoxic respiratory failure - ongoing, still requiring 60% FIO2 - right after her procedure, suspect aspiration PNA - has recently received chemotherapy, continue broad spectrum antibiotics, on Vancomycin and Zosyn since 5/13 - very shallow breathing, CXR with significant atelectasis, imaging reviewed independently - discussed with Dr. Nelda Marseille today  - PE less likely given acute onset in the setting of a procedure  Edema - she appears to be up 13L since admission, clinically she appears fluid overloaded - repeat Lasix today - daily weights - no evidence of heart failure on 2D echo  Oral thrush and odynophagia,  - continue iv diflucan, acyclovir (started by oncologist Dr.  [name])  Thrombocytopenia - from chemo vs infection vs consumption.  - improving  Metastatic breast cancer with brain mets - Reported has not been getting full dose chemo due to recent illness, followed by Dr. [name].  S/p bilateral sinus surgery and Draf III frontal sinusotomy 03/03/2015. - Per ENT   Constipation:  - stool softener - improved   Diet: Diet regular Room service appropriate?: Yes; Fluid consistency:: Thin Fluids: none  DVT Prophylaxis: lovenox on 5/15 stopped due to worsening of thrombocytopenia. Resumed today.  Code Status: No Order Family Communication: d/w sister bedside  Disposition Plan: remain inpatient   Consultants:  PCCM  Procedures:  bilateral sinus surgery and Draf III frontal sinusotomy 03/03/2015   Antibiotics Vancomycin 5/13 >> Zosyn 5/13 >>   Studies  Dg Chest Port 1 View  03/10/2015   CLINICAL DATA:  Hypoxia, status post sinus surgery, history of asthma breast malignancy and previous tobacco use.  EXAM: PORTABLE CHEST - 1 VIEW  COMPARISON:  PA and lateral chest of Mar 08, 2015  FINDINGS: There is persistent bilateral pulmonary hypo inflation. The pulmonary interstitial markings remain increased but have slightly improved since yesterday's study. Small amounts of pleural fluid on the right are suspected. The cardiac silhouette is largely obscured. The central pulmonary vascularity is prominent but less conspicuous than on yesterday's study. A Port-A-Cath appliance tip projects over the midportion of the SVC.  IMPRESSION: Persistent bilateral pulmonary hypoinflation with mild improvement in the appearance of the interstitium. A small right pleural effusion is suspected.   Electronically Signed   By: David  Martinique M.D.   On: 03/10/2015 07:28   Objective  Filed Vitals:   03/09/15 2206 03/10/15 0246 03/10/15 0522 03/10/15 1229  BP: 103/62  89/57   Pulse: 100 78 80   Temp: 98.1 F (36.7 C)  98.5 F (36.9 C)   TempSrc: Axillary  Axillary    Resp: 18 18 19    Height:      Weight:      SpO2: 95% 94% 95% 95%    Intake/Output Summary (Last 24 hours) at 03/10/15 1356 Last data filed at 03/10/15 1206  Gross per 24 hour  Intake   1140 ml  Output    826 ml  Net    314 ml   Filed Weights   03/03/15 1052  Weight: 75.297 kg (166 lb)   Exam:  General:  NAD  HEENT: no scleral icterus, PERRL  Cardiovascular: RRR without MRG, 2+ peripheral pulses, 2+ pitting LE edema  Respiratory: very shallow breathing, no wheezing, decreased sounds at the bases  Abdomen: soft, non tender, BS +, no guarding  MSK/Extremities: no clubbing/cyanosis, no joint swelling  Skin: no rashes  Neuro: non focal  Data Reviewed: Basic Metabolic Panel:  Recent Labs Lab 03/06/15 0425 03/07/15 0436 03/08/15 0145 03/09/15 0520 03/10/15 0412  NA 138 138 140 140 141  K 3.6 3.4* 3.4* 3.8 3.0*  CL 104 104 105 107 102  CO2 28 26 27 28  32  GLUCOSE 92 87 81 80 102*  BUN <5* 5* 5* <5* 5*  CREATININE 0.77 0.78 0.73 0.85 0.93  CALCIUM 8.1* 8.0* 8.1* 8.0* 8.2*  MG  --   --  2.0 2.0 2.1   Liver Function Tests:  Recent Labs Lab 03/05/15 1500 03/06/15 0425  AST 60* 41  ALT 51 38  ALKPHOS 278* 238*  BILITOT 0.7 0.4  PROT 6.4* 6.3*  ALBUMIN 2.8* 2.5*   CBC:  Recent Labs Lab 03/06/15 0425 03/07/15 0436 03/08/15 0145 03/09/15 0520 03/10/15 0412  WBC 7.2 6.5 6.5 4.7 5.6  HGB 8.9* 8.6* 8.5* 8.3* 8.3*  HCT 27.7* 26.7* 26.6* 25.9* 25.5*  MCV 86.6 87.8 87.5 88.4 86.7  PLT 98* 87* 106* 98* 124*   BNP (last 3 results)  Recent Labs  03/05/15 1851  BNP 48.8    Recent Results (from the past 240 hour(s))  Culture, routine-sinus     Status: None   Collection Time: 03/03/15  3:58 PM  Result Value Ref Range Status   Specimen Description NASAL SWAB  Final   Special Requests NONE  Final   Culture   Final    FEW VIRIDANS STREPTOCOCCUS Note: SOURCE IS SINUS Performed at [facility]    Report Status 03/08/2015 FINAL  Final   MRSA PCR Screening     Status: None   Collection Time: 03/03/15 10:24 PM  Result Value Ref Range Status   MRSA by PCR NEGATIVE NEGATIVE Final    Comment:        The GeneXpert MRSA Assay (FDA approved for NASAL specimens only), is one component of a comprehensive MRSA colonization surveillance program. It is not intended to diagnose MRSA infection nor to guide or monitor treatment for MRSA infections.   Culture, blood (routine x 2)     Status: None (Preliminary result)   Collection Time: 03/05/15  3:00 PM  Result Value Ref Range Status   Specimen Description BLOOD LEFT HAND  Final   Special Requests BOTTLES DRAWN AEROBIC AND ANAEROBIC 10CC  Final   Culture   Final           BLOOD CULTURE RECEIVED NO GROWTH TO DATE CULTURE WILL BE HELD FOR 5 DAYS  BEFORE ISSUING A FINAL NEGATIVE REPORT Performed at [facility]    Report Status PENDING  Incomplete  Culture, blood (routine x 2)     Status: None (Preliminary result)   Collection Time: 03/05/15  3:10 PM  Result Value Ref Range Status   Specimen Description BLOOD RIGHT HAND  Final   Special Requests Reviewed by Louanne Belton. Rodney Cruise, M.D. [facility]  Final   Culture   Final           BLOOD CULTURE RECEIVED NO GROWTH TO DATE CULTURE WILL BE HELD FOR 5 DAYS BEFORE ISSUING A FINAL NEGATIVE REPORT Note: Culture results may be compromised due to an inadequate volume of blood received in culture bottles. Performed at [facility]    Report Status PENDING  Incomplete  Urine culture     Status: None   Collection Time: 03/05/15  7:00 PM  Result Value Ref Range Status   Specimen Description URINE, CLEAN CATCH  Final   Special Requests NONE  Final   Colony Count NO GROWTH Performed at [facility]   Final   Culture NO GROWTH Performed at [facility]   Final   Report Status 03/06/2015 FINAL  Final     Scheduled Meds: . acidophilus  2 capsule Oral BID  . acyclovir  5 mg/kg Intravenous 3 times per day  .  albuterol  2.5 mg Nebulization TID  . antiseptic oral rinse  7 mL Mouth Rinse BID  . calcium carbonate  2 tablet Oral BID  . dextromethorphan-guaiFENesin  1 tablet Oral BID  . dronabinol  10 mg Oral TID AC  . enoxaparin (LOVENOX) injection  40 mg Subcutaneous Q24H  . feeding supplement (ENSURE ENLIVE)  237 mL Oral TID BM  . fentaNYL  50 mcg Transdermal Q72H  . fluconazole (DIFLUCAN) IV  200 mg Intravenous Q24H  . furosemide  20 mg Intravenous Q8H  . lactobacillus  1 g Oral TID WC  . lidocaine  15 mL Mouth/Throat Q4H  . methylphenidate  10 mg Oral BID  . multivitamin with minerals  1 tablet Oral Daily  . OLANZapine  5 mg Oral QHS  . pantoprazole  40 mg Oral Daily  . piperacillin-tazobactam (ZOSYN)  IV  3.375 g Intravenous Q8H  . polyethylene glycol  17 g Oral Daily  . potassium chloride  40 mEq Oral TID  . scopolamine  1 patch Transdermal Q72H  . senna-docusate  2 tablet Oral BID  . sodium chloride  10-40 mL Intracatheter Q12H  . vancomycin  750 mg Intravenous Q8H  . Vitamin D (Ergocalciferol)  50,000 Units Oral Q Thu   Continuous Infusions:   Time spent: 35 minutes  Marzetta Board, MD [facility] Pager [phone]. If 7 PM - 7 AM, please contact night-coverage at [email or web address], password [facility] 03/10/2015, 1:56 PM  LOS: 7 days

## 2015-03-10 NOTE — Progress Notes (Addendum)
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 Patient is s/p bilateral sinus surgery and Draf III sinusotomy w/ removal of fronto-ethmoid mucocele on 5/11 afterwhich patient developed hypoxia and acute respiroatory failure secondary to significant atelectasis.    PT Comments    Patient ambulated on 40 percent 10 Liters O2 via nasal tent. Saturations remained >95% throughout ambulation during continuous monitoring. Patient tolerated increased activity and distance this session. OF NOTE: patient still reports visual deficits impacting mobility. Will continue to see and progress as tolerated.  MD: please consider OT order to address safety with ADLs and Visual deficits thank you.    Follow Up Recommendations  Home health PT;Supervision/Assistance - 24 hour;Supervision for mobility/OOB     Equipment Recommendations  Rolling walker with 5" wheels    Recommendations for Other Services       Precautions / Restrictions Precautions Precautions: Fall Restrictions Weight Bearing Restrictions: No    Mobility  Bed Mobility               General bed mobility comments: received in chair  Transfers Overall transfer level: Needs assistance Equipment used: None Transfers: Sit to/from Stand Sit to Stand: Min guard         General transfer comment: min guard for stability  Ambulation/Gait Ambulation/Gait assistance: Min guard;Min assist Ambulation Distance (Feet): 160 Feet (80 ft with HHA) Assistive device: 1 person hand held assist (pushing IV pole) Gait Pattern/deviations: Step-through pattern;Decreased stride length;Drifts right/left Gait velocity: decreased Gait velocity interpretation: Below normal speed for age/gender General  Gait Details: some instability noted, easily fatigued with reports of some dizziness during ambulation. Seated rest break required.   Stairs            Wheelchair Mobility    Modified Rankin (Stroke Patients Only)       Balance   Sitting-balance support: Feet supported Sitting balance-Leahy Scale: Good     Standing balance support: During functional activity Standing balance-Leahy Scale: Fair                      Cognition Arousal/Alertness: Awake/alert Behavior During Therapy: WFL for tasks assessed/performed Overall Cognitive Status: Within Functional Limits for tasks assessed                      Exercises      General Comments        Pertinent Vitals/Pain Pain Assessment: 0-10 Pain Score: 5  Pain Location: face Pain Descriptors / Indicators: Sore Pain Intervention(s): Monitored during session;Repositioned    Home Living                      Prior Function            PT Goals (current goals can now be found in the care plan section) Acute Rehab PT Goals Patient Stated Goal: to go home PT Goal Formulation: With patient Time For Goal Achievement: 03/23/15 Potential to Achieve Goals: Good Progress towards PT goals: Progressing toward goals    Frequency  Min 3X/week    PT Plan Current plan remains appropriate    Co-evaluation             End of Session Equipment Utilized During Treatment: Gait belt;Oxygen Activity Tolerance: Patient  tolerated treatment well;Patient limited by fatigue Patient left: in chair;with call bell/phone within reach;with family/visitor present     Time: 5300-5110 PT Time Calculation (min) (ACUTE ONLY): 24 min  Charges:  $Gait Training: 8-22 mins $Therapeutic Activity: 8-22 mins                    G CodesDuncan Kent 04-06-15, 5:12 PM Bianca Kent, [place] DPT  [phone]

## 2015-03-10 NOTE — Progress Notes (Addendum)
Name: JOCI DRESS MRN: [id number] DOB: December 20, 1965    ADMISSION DATE:  03/03/2015 CONSULTATION DATE:  5/14  REFERRING MD :  Erlinda Hong  CHIEF COMPLAINT/reason for consult: hypoxia/SIRS (possible sepsis)  BRIEF PATIENT DESCRIPTION: This is a 49 year old female w/ sig history of metastatic breast cancer w/ mets to brain (followed by [facility]). Last partial dose chemo 5/5, last xrt to left temporal area Feb 2016. Had remote h/o sinus surgery from which she had CSF leak w/ prior repair. 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 Surgery was uneventful but she was kept in the hospital over night as she had post-op sats in low 90s and needed pain management. Developed fever and had asymptomatic hypoxia w/ room air sats 70s on 5/13. Given IVFs, ABX widened. PCCM asked to see the following day for hypoxia and concern for sepsis. Found to have ATX thought due to suspected OSA, nasal packing, and possibly pain medications.   SIGNIFICANT EVENTS  5/11: bilateral sinus surgery and Draf III sinusotomy w/ removal of fronto-ethmoid mucocele  STUDIES:  Upper extremity doppler 5/15> No evidence upper extremity DVT Echo 5/16 >>>  SUBJECTIVE: Feels "loopy" but better from a respiratory standpoint.  VITAL SIGNS: Temp:  [97.5 F (36.4 C)-98.5 F (36.9 C)] 98.5 F (36.9 C) (05/18 0522) Pulse Rate:  [78-104] 80 (05/18 0522) Resp:  [18-20] 19 (05/18 0522) BP: (89-111)/(57-62) 89/57 mmHg (05/18 0522) SpO2:  [94 %-98 %] 95 % (05/18 0522) FiO2 (%):  [40 %-60 %] 60 % (05/18 0522)  PHYSICAL EXAMINATION: General:  49 year old female, s/p sinus surgery, currently not in any distress.  Neuro:  Awake, alert, oriented. No focal def  Head: [place]/AT EENT:  + facial edema, sinus packing. Otherwise nml cephalic  Cardiovascular:  rrr Lungs:  Crackles both bases L > R but improved compared to 5/16, no accessory  muscle use  Abdomen:  Soft, non-tender + bowel sounds  Musculoskeletal:  Intact  Skin:  Trace UE edema   Recent Labs Lab 03/08/15 0145 03/09/15 0520 03/10/15 0412  NA 140 140 141  K 3.4* 3.8 3.0*  CL 105 107 102  CO2 27 28 32  BUN 5* <5* 5*  CREATININE 0.73 0.85 0.93  GLUCOSE 81 80 102*    Recent Labs Lab 03/08/15 0145 03/09/15 0520 03/10/15 0412  HGB 8.5* 8.3* 8.3*  HCT 26.6* 25.9* 25.5*  WBC 6.5 4.7 5.6  PLT 106* 98* 124*   ABG    Component Value Date/Time   PHART 7.376 03/06/2015 1440   PCO2ART 46.3* 03/06/2015 1440   PO2ART 61.2* 03/06/2015 1440   HCO3 26.5* 03/06/2015 1440   TCO2 27.9 03/06/2015 1440   ACIDBASEDEF 1.0 08/11/2007 2259   O2SAT 89.7 03/06/2015 1440    Dg Chest Port 1 View  03/10/2015   CLINICAL DATA:  Hypoxia, status post sinus surgery, history of asthma breast malignancy and previous tobacco use.  EXAM: PORTABLE CHEST - 1 VIEW  COMPARISON:  PA and lateral chest of Mar 08, 2015  FINDINGS: There is persistent bilateral pulmonary hypo inflation. The pulmonary interstitial markings remain increased but have slightly improved since yesterday's study. Small amounts of pleural fluid on the right are suspected. The cardiac silhouette is largely obscured. The central pulmonary vascularity is prominent but less conspicuous than on yesterday's study. A Port-A-Cath appliance tip projects over the midportion of the SVC.  IMPRESSION: Persistent bilateral  pulmonary hypoinflation with mild improvement in the appearance of the interstitium. A small right pleural effusion is suspected.   Electronically Signed   By: David  Martinique M.D.   On: 03/10/2015 07:28    ASSESSMENT / PLAN:  Acute respiratory failure secondary to significant atelectasis and immobility. ?PNA ?PE > BUE edema, dopplers negative. Doubt in setting hypoinflation and ATX on CXR -Continue aggressive pulmonary hygiene -Supplemental O2 with face tent to maintain SpO2 > 92% -Continue empiric abx for  now. -Trend PCT   OSA - Unfortunately the pt is not able to wear cpap post sinus surgery. Can use supplemental oxygen by face tent during sleep. 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  Developed significant atelectasis and concern for bibasilar infiltrate.  I reviewed CXR myself, remains hypoinflated and with bibasilar atelectasis.  Hypoxemia: due to bibasilar atelectasis, seriously doubt a PE here specially with a normal echo, hypoxemia easily explainable with low volume and atelectasis.  - Supplemental O2 via tent face mask.  - Titrate O2 for sat of 88-92%.  - Will monitor.  - CXR ordered for AM.  Atelectasis: due to lack of mobility.  - IS per RT protocol.  - OOB to chair.  - Ambulate aggressively with PT.  OSA:  - No CPAP for now.  - Supplemental O2 only.  - Will need input from ENT as to when we can use CPAP again.  HCAP:   - Continue vanc and zosyn.  - F/U on cultures.  Hypokalemia: new and worsening.  - K-Dur as ordered.  - BMET in AM.  Lower ext edema, positive 3L overnight.  - Lasix 20 mg IV q8 x2 doses.  - BMET in AM.  Discussed with with Dr. Cruzita Lederer.  Rush Farmer, M.D. [facility]. Pager: [phone]. After hours pager: [phone].

## 2015-03-11 ENCOUNTER — Ambulatory Visit (HOSPITAL_COMMUNITY): Payer: 59

## 2015-03-11 ENCOUNTER — Ambulatory Visit (HOSPITAL_COMMUNITY)
Admission: RE | Admit: 2015-03-11 | Discharge: 2015-03-11 | Disposition: A | Discharge status: Home / Self Care | Payer: 59 | Source: Ambulatory Visit | Attending: Hematology & Oncology | Admitting: Hematology & Oncology

## 2015-03-11 DIAGNOSIS — J189 Pneumonia, unspecified organism: Secondary | ICD-10-CM | POA: Insufficient documentation

## 2015-03-11 DIAGNOSIS — C50911 Malignant neoplasm of unspecified site of right female breast: Secondary | ICD-10-CM

## 2015-03-11 DIAGNOSIS — J690 Pneumonitis due to inhalation of food and vomit: Secondary | ICD-10-CM

## 2015-03-11 LAB — BASIC METABOLIC PANEL
Anion gap: 7 (ref 5–15)
BUN: 7 mg/dL (ref 6–20)
CHLORIDE: 100 mmol/L — AB (ref 101–111)
CO2: 33 mmol/L — ABNORMAL HIGH (ref 22–32)
Calcium: 8 mg/dL — ABNORMAL LOW (ref 8.9–10.3)
Creatinine, Ser: 0.88 mg/dL (ref 0.44–1.00)
GLUCOSE: 117 mg/dL — AB (ref 65–99)
POTASSIUM: 3.1 mmol/L — AB (ref 3.5–5.1)
Sodium: 140 mmol/L (ref 135–145)

## 2015-03-11 LAB — CBC
HCT: 24.5 % — ABNORMAL LOW (ref 36.0–46.0)
HEMOGLOBIN: 8.1 g/dL — AB (ref 12.0–15.0)
MCH: 28.5 pg (ref 26.0–34.0)
MCHC: 33.1 g/dL (ref 30.0–36.0)
MCV: 86.3 fL (ref 78.0–100.0)
PLATELETS: 135 10*3/uL — AB (ref 150–400)
RBC: 2.84 MIL/uL — AB (ref 3.87–5.11)
RDW: 18.5 % — ABNORMAL HIGH (ref 11.5–15.5)
WBC: 5.4 10*3/uL (ref 4.0–10.5)

## 2015-03-11 LAB — PHOSPHORUS: Phosphorus: 4.4 mg/dL (ref 2.5–4.6)

## 2015-03-11 LAB — MAGNESIUM: Magnesium: 2.3 mg/dL (ref 1.7–2.4)

## 2015-03-11 MED ORDER — ACETYLCYSTEINE 20 % IN SOLN
4.0000 mL | Freq: Three times a day (TID) | RESPIRATORY_TRACT | Status: DC
  Administered 2015-03-11 – 2015-03-12 (×3): 4 mL via RESPIRATORY_TRACT
  Filled 2015-03-11 (×14): qty 4

## 2015-03-11 MED ORDER — DM-GUAIFENESIN ER 30-600 MG PO TB12: 1.0000 | ORAL_TABLET | Freq: Two times a day (BID) | ORAL | Status: DC

## 2015-03-11 MED ORDER — ALBUTEROL SULFATE (2.5 MG/3ML) 0.083% IN NEBU: 2.5000 mg | INHALATION_SOLUTION | RESPIRATORY_TRACT | Status: DC | PRN

## 2015-03-11 MED ORDER — POTASSIUM CHLORIDE CRYS ER 20 MEQ PO TBCR
40.0000 meq | EXTENDED_RELEASE_TABLET | Freq: Four times a day (QID) | ORAL | Status: AC
  Administered 2015-03-11 (×3): 40 meq via ORAL
  Filled 2015-03-11 (×3): qty 2

## 2015-03-11 MED ORDER — FUROSEMIDE 10 MG/ML IJ SOLN
20.0000 mg | Freq: Four times a day (QID) | INTRAMUSCULAR | Status: AC
  Administered 2015-03-11 (×3): 20 mg via INTRAVENOUS
  Filled 2015-03-11 (×3): qty 2

## 2015-03-11 NOTE — Progress Notes (Signed)
ANTIBIOTIC CONSULT NOTE - FOLLOW UP  Pharmacy Consult for vancomycin Indication: rule out sepsis  Allergies  Allergen Reactions  . Iohexol      Code: RASH, Desc: VERY STRONG FAMILY HX OF ANGIOEDEMA WHEN RECEIVING IV CONTRAST; PT HAS BEEN PREMEDICATED FOR OTHER CONTRASTED STUDIES(IN CATH. LAB)  KR, Onset Date: [id number]   . Prednisone Itching    Capillary beds bust  . Tetanus Toxoids Other (See Comments)    Ran a high fever for 48 hours  . Theophyllines Hives    Mental changes  . Versed [Midazolam] Other (See Comments)    Pt becomes violent    Patient Measurements: Height: 5\' 7"  (170.2 cm) Weight: 186 lb 1.1 oz (84.4 kg) IBW/kg (Calculated) : 61.6 Vital Signs: Temp: 98.6 F (37 C) (05/19 0557) Temp Source: Axillary (05/19 0557) BP: 110/60 mmHg (05/19 0557) Pulse Rate: 99 (05/19 0557) Intake/Output from previous day: 05/18 0701 - 05/19 0700 In: 1387.5 [P.O.:480; IV Piggyback:907.5] Out: 700 [Urine:700] Intake/Output from this shift:    Labs:  Recent Labs  03/09/15 0520 03/10/15 0412 03/11/15 0542  WBC 4.7 5.6 5.4  HGB 8.3* 8.3* 8.1*  PLT 98* 124* 135*  CREATININE 0.85 0.93 0.88   Estimated Creatinine Clearance: 86.3 mL/min (by C-G formula based on Cr of 0.88). No results for input(s): VANCOTROUGH, VANCOPEAK, VANCORANDOM, GENTTROUGH, GENTPEAK, GENTRANDOM, TOBRATROUGH, TOBRAPEAK, TOBRARND, AMIKACINPEAK, AMIKACINTROU, AMIKACIN in the last 72 hours.   Microbiology: Recent Results (from the past 720 hour(s))  Culture, routine-sinus     Status: None   Collection Time: 03/03/15  3:58 PM  Result Value Ref Range Status   Specimen Description NASAL SWAB  Final   Special Requests NONE  Final   Culture   Final    FEW VIRIDANS STREPTOCOCCUS Note: SOURCE IS SINUS Performed at [facility]    Report Status 03/08/2015 FINAL  Final  MRSA PCR Screening     Status: None   Collection Time: 03/03/15 10:24 PM  Result Value Ref Range Status   MRSA by PCR NEGATIVE  NEGATIVE Final    Comment:        The GeneXpert MRSA Assay (FDA approved for NASAL specimens only), is one component of a comprehensive MRSA colonization surveillance program. It is not intended to diagnose MRSA infection nor to guide or monitor treatment for MRSA infections.   Culture, blood (routine x 2)     Status: None (Preliminary result)   Collection Time: 03/05/15  3:00 PM  Result Value Ref Range Status   Specimen Description BLOOD LEFT HAND  Final   Special Requests BOTTLES DRAWN AEROBIC AND ANAEROBIC 10CC  Final   Culture   Final           BLOOD CULTURE RECEIVED NO GROWTH TO DATE CULTURE WILL BE HELD FOR 5 DAYS BEFORE ISSUING A FINAL NEGATIVE REPORT Performed at [facility]    Report Status PENDING  Incomplete  Culture, blood (routine x 2)     Status: None (Preliminary result)   Collection Time: 03/05/15  3:10 PM  Result Value Ref Range Status   Specimen Description BLOOD RIGHT HAND  Final   Special Requests Reviewed by Louanne Belton. Rodney Cruise, M.D. [facility]  Final   Culture   Final           BLOOD CULTURE RECEIVED NO GROWTH TO DATE CULTURE WILL BE HELD FOR 5 DAYS BEFORE ISSUING A FINAL NEGATIVE REPORT Note: Culture results may be compromised due to an inadequate volume of blood received in  culture bottles. Performed at [facility]    Report Status PENDING  Incomplete  Urine culture     Status: None   Collection Time: 03/05/15  7:00 PM  Result Value Ref Range Status   Specimen Description URINE, CLEAN CATCH  Final   Special Requests NONE  Final   Colony Count NO GROWTH Performed at [facility]   Final   Culture NO GROWTH Performed at [facility]   Final   Report Status 03/06/2015 FINAL  Final    Anti-infectives    Start     Dose/Rate Route Frequency Ordered Stop   03/08/15 2200  acyclovir (ZOVIRAX) 375 mg in dextrose 5 % 100 mL IVPB     5 mg/kg  75.3 kg 107.5 mL/hr over 60 Minutes Intravenous 3 times per day 03/08/15 1909 03/11/15  2159   03/08/15 2000  fluconazole (DIFLUCAN) IVPB 200 mg     200 mg 100 mL/hr over 60 Minutes Intravenous  Once 03/08/15 1840 03/08/15 2100   03/08/15 2000  fluconazole (DIFLUCAN) IVPB 200 mg     200 mg 100 mL/hr over 60 Minutes Intravenous Every 24 hours 03/08/15 1909 03/15/15 1959   03/06/15 0200  vancomycin (VANCOCIN) IVPB 750 mg/150 ml premix     750 mg 150 mL/hr over 60 Minutes Intravenous Every 8 hours 03/05/15 1547     03/05/15 1600  vancomycin (VANCOCIN) 1,250 mg in sodium chloride 0.9 % 250 mL IVPB     1,250 mg 166.7 mL/hr over 90 Minutes Intravenous  Once 03/05/15 1547 03/05/15 1936   03/05/15 1545  piperacillin-tazobactam (ZOSYN) IVPB 3.375 g  Status:  Discontinued     3.375 g 100 mL/hr over 30 Minutes Intravenous 3 times per day 03/05/15 1532 03/05/15 1532   03/05/15 1545  piperacillin-tazobactam (ZOSYN) IVPB 3.375 g  Status:  Discontinued     3.375 g 100 mL/hr over 30 Minutes Intravenous 3 times per day 03/05/15 1532 03/05/15 1534   03/05/15 1545  piperacillin-tazobactam (ZOSYN) IVPB 3.375 g     3.375 g 12.5 mL/hr over 240 Minutes Intravenous Every 8 hours 03/05/15 1534     03/05/15 0000  doxycycline (VIBRA-TABS) 100 MG tablet     100 mg Oral Every 12 hours 03/05/15 1102     03/04/15 1000  doxycycline (VIBRA-TABS) tablet 100 mg  Status:  Discontinued     100 mg Oral Every 12 hours 03/04/15 0612 03/05/15 1623   03/03/15 1215  clindamycin (CLEOCIN) IVPB 600 mg     600 mg 100 mL/hr over 30 Minutes Intravenous  Once 03/03/15 1201 03/03/15 1540      Assessment: 49 year old female continues on Zosyn and  vancomycin fro r/o sepsis with presumed pulmonary versus CNS/sinus source. 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 Pt is afebrile and WBC is WNL. Doses remain appropriate.  Fluconazole 5/16 > [5/23] Acyclovir 5/16 > [5/19] Vanc 5/13  >> Zosyn 5/13 >> Doxy 5/12 >> 5/13  5/11 Nasal swab >> strep viridans 5/13 BCx >> ngtd 5/13 UCx >> neg  5/16 VT 17  Goal of Therapy:  Vancomycin trough level 15-20 mcg/ml  Plan:  - Continue vanc 750mg  IV Q8H - F/u renal fxn, C&S, clinical status and trough at [facility] - MD - Consider de-escalating antibiotics  Salome Arnt, PharmD, BCPS Pager # [phone] 03/11/2015 10:33 AM

## 2015-03-11 NOTE — Progress Notes (Signed)
PROGRESS NOTE  Bianca Kent EPP:[id number] DOB: 02/22/1966 DOA: 03/03/2015 PCP: [name], MD  HPI: 49 year old female with a history of metastatic breast cancer to the brain (Dr. [name]), asthma, depression, GERD, was admitted on 03/03/2015 for elective outpatient sinus surgery. The patient underwent a left frontal ethmoid mucocele drainage performed by Dr. [name]. During the surgery, the patient also had her anterior Reform CSF leak from the mucocele repaired with cautery. Postoperatively, the patient has some hypoxemia, and therefore she was kept overnight. On postoperative day #2, the patient's oxygen saturations have somewhat improved and the patient was about to be discharged home. Unfortunately, the patient began developing fever and subsequently was found to have hypoxemia with oxygen saturations in the mid 70s on room air.   Subjective / 24 H Interval events - denies shortness of breath this morning, on mask   Assessment/Plan: Active Problems:   Post-operative state   Acute respiratory failure with hypoxia   Postprocedural fever   Breast cancer, stage 4   Thrombocytopenia   Confusion   Hypoxemia   HCAP (healthcare-associated pneumonia)   Acute respiratory failure   Acute hypoxic respiratory failure - ongoing, still requiring 60% FIO2 - right after her procedure, suspect aspiration PNA - has recently received chemotherapy, continue broad spectrum antibiotics, on Vancomycin and Zosyn since 5/13 - appreciate PCCM consult, ?bronch tomorrow - PE less likely given acute onset in the setting of a procedure  Edema - she is up 20 lbs since admission - continue IV Lasix today - daily weights - no evidence of heart failure on 2D echo - renal function holding with Lasix  Hypokalemia - due to Lasix, replete  Oral thrush and odynophagia,  - continue iv diflucan, acyclovir (started by oncologist Dr. [name])  Thrombocytopenia - from chemo vs infection vs  consumption.  - improving  Metastatic breast cancer with brain mets - Reported has not been getting full dose chemo due to recent illness, followed by Dr. [name].  S/p bilateral sinus surgery and Draf III frontal sinusotomy 03/03/2015. - Per ENT   Constipation:  - stool softener - improved   Diet: Diet regular Room service appropriate?: Yes; Fluid consistency:: Thin Fluids: none  DVT Prophylaxis: lovenox on 5/15 stopped due to worsening of thrombocytopenia. Resumed 5/18  Code Status: No Order Family Communication: d/w sister bedside  Disposition Plan: remain inpatient   Consultants:  PCCM  Procedures:  bilateral sinus surgery and Draf III frontal sinusotomy 03/03/2015   Antibiotics Vancomycin 5/13 >> Zosyn 5/13 >>   Studies  Dg Chest Port 1 View  03/10/2015   CLINICAL DATA:  Hypoxia, status post sinus surgery, history of asthma breast malignancy and previous tobacco use.  EXAM: PORTABLE CHEST - 1 VIEW  COMPARISON:  PA and lateral chest of Mar 08, 2015  FINDINGS: There is persistent bilateral pulmonary hypo inflation. The pulmonary interstitial markings remain increased but have slightly improved since yesterday's study. Small amounts of pleural fluid on the right are suspected. The cardiac silhouette is largely obscured. The central pulmonary vascularity is prominent but less conspicuous than on yesterday's study. A Port-A-Cath appliance tip projects over the midportion of the SVC.  IMPRESSION: Persistent bilateral pulmonary hypoinflation with mild improvement in the appearance of the interstitium. A small right pleural effusion is suspected.   Electronically Signed   By: David  Martinique M.D.   On: 03/10/2015 07:28   Objective  Filed Vitals:   03/10/15 1229 03/10/15 1500 03/10/15 2200 03/11/15 0557  BP:  111/61  95/58 110/60  Pulse:  74 94 99  Temp:  99.6 F (37.6 C) 98.8 F (37.1 C) 98.6 F (37 C)  TempSrc:  Axillary Axillary Axillary  Resp:  18 18 18   Height:        Weight:    84.4 kg (186 lb 1.1 oz)  SpO2: 95% 96% 96% 95%    Intake/Output Summary (Last 24 hours) at 03/11/15 0953 Last data filed at 03/11/15 0600  Gross per 24 hour  Intake 1147.5 ml  Output    700 ml  Net  447.5 ml   Filed Weights   03/03/15 1052 03/11/15 0557  Weight: 75.297 kg (166 lb) 84.4 kg (186 lb 1.1 oz)   Exam:  General:  NAD  HEENT: no scleral icterus, PERRL  Cardiovascular: RRR without MRG, 2+ peripheral pulses, 2+ pitting LE edema  Respiratory: shallow breathing, no wheezing, decreased sounds at the bases  Abdomen: soft, non tender, BS +, no guarding  MSK/Extremities: no clubbing/cyanosis, no joint swelling  Skin: no rashes  Neuro: non focal  Data Reviewed: Basic Metabolic Panel:  Recent Labs Lab 03/07/15 0436 03/08/15 0145 03/09/15 0520 03/10/15 0412 03/11/15 0542  NA 138 140 140 141 140  K 3.4* 3.4* 3.8 3.0* 3.1*  CL 104 105 107 102 100*  CO2 26 27 28  32 33*  GLUCOSE 87 81 80 102* 117*  BUN 5* 5* <5* 5* 7  CREATININE 0.78 0.73 0.85 0.93 0.88  CALCIUM 8.0* 8.1* 8.0* 8.2* 8.0*  MG  --  2.0 2.0 2.1 2.3  PHOS  --   --   --   --  4.4   Liver Function Tests:  Recent Labs Lab 03/05/15 1500 03/06/15 0425  AST 60* 41  ALT 51 38  ALKPHOS 278* 238*  BILITOT 0.7 0.4  PROT 6.4* 6.3*  ALBUMIN 2.8* 2.5*   CBC:  Recent Labs Lab 03/07/15 0436 03/08/15 0145 03/09/15 0520 03/10/15 0412 03/11/15 0542  WBC 6.5 6.5 4.7 5.6 5.4  HGB 8.6* 8.5* 8.3* 8.3* 8.1*  HCT 26.7* 26.6* 25.9* 25.5* 24.5*  MCV 87.8 87.5 88.4 86.7 86.3  PLT 87* 106* 98* 124* 135*   BNP (last 3 results)  Recent Labs  03/05/15 1851  BNP 48.8    Recent Results (from the past 240 hour(s))  Culture, routine-sinus     Status: None   Collection Time: 03/03/15  3:58 PM  Result Value Ref Range Status   Specimen Description NASAL SWAB  Final   Special Requests NONE  Final   Culture   Final    FEW VIRIDANS STREPTOCOCCUS Note: SOURCE IS SINUS Performed at [facility]    Report Status 03/08/2015 FINAL  Final  MRSA PCR Screening     Status: None   Collection Time: 03/03/15 10:24 PM  Result Value Ref Range Status   MRSA by PCR NEGATIVE NEGATIVE Final    Comment:        The GeneXpert MRSA Assay (FDA approved for NASAL specimens only), is one component of a comprehensive MRSA colonization surveillance program. It is not intended to diagnose MRSA infection nor to guide or monitor treatment for MRSA infections.   Culture, blood (routine x 2)     Status: None (Preliminary result)   Collection Time: 03/05/15  3:00 PM  Result Value Ref Range Status   Specimen Description BLOOD LEFT HAND  Final   Special Requests BOTTLES DRAWN AEROBIC AND ANAEROBIC 10CC  Final   Culture   Final  BLOOD CULTURE RECEIVED NO GROWTH TO DATE CULTURE WILL BE HELD FOR 5 DAYS BEFORE ISSUING A FINAL NEGATIVE REPORT Performed at [facility]    Report Status PENDING  Incomplete  Culture, blood (routine x 2)     Status: None (Preliminary result)   Collection Time: 03/05/15  3:10 PM  Result Value Ref Range Status   Specimen Description BLOOD RIGHT HAND  Final   Special Requests Reviewed by Louanne Belton Rodney Cruise, M.D. [facility]  Final   Culture   Final           BLOOD CULTURE RECEIVED NO GROWTH TO DATE CULTURE WILL BE HELD FOR 5 DAYS BEFORE ISSUING A FINAL NEGATIVE REPORT Note: Culture results may be compromised due to an inadequate volume of blood received in culture bottles. Performed at [facility]    Report Status PENDING  Incomplete  Urine culture     Status: None   Collection Time: 03/05/15  7:00 PM  Result Value Ref Range Status   Specimen Description URINE, CLEAN CATCH  Final   Special Requests NONE  Final   Colony Count NO GROWTH Performed at [facility]   Final   Culture NO GROWTH Performed at [facility]   Final   Report Status 03/06/2015 FINAL  Final     Scheduled Meds: . acetylcysteine  4 mL Nebulization TID   . acidophilus  2 capsule Oral BID  . acyclovir  5 mg/kg Intravenous 3 times per day  . albuterol  2.5 mg Nebulization TID  . antiseptic oral rinse  7 mL Mouth Rinse BID  . calcium carbonate  2 tablet Oral BID  . dextromethorphan-guaiFENesin  1 tablet Oral BID  . dronabinol  10 mg Oral TID AC  . enoxaparin (LOVENOX) injection  40 mg Subcutaneous Q24H  . feeding supplement (ENSURE ENLIVE)  237 mL Oral TID BM  . fentaNYL  50 mcg Transdermal Q72H  . fluconazole (DIFLUCAN) IV  200 mg Intravenous Q24H  . furosemide  20 mg Intravenous Q6H  . lactobacillus  1 g Oral TID WC  . lidocaine  15 mL Mouth/Throat Q4H  . methylphenidate  10 mg Oral BID  . multivitamin with minerals  1 tablet Oral Daily  . OLANZapine  5 mg Oral QHS  . pantoprazole  40 mg Oral Daily  . piperacillin-tazobactam (ZOSYN)  IV  3.375 g Intravenous Q8H  . polyethylene glycol  17 g Oral Daily  . potassium chloride  40 mEq Oral Q6H  . scopolamine  1 patch Transdermal Q72H  . senna-docusate  2 tablet Oral BID  . sodium chloride  10-40 mL Intracatheter Q12H  . vancomycin  750 mg Intravenous Q8H  . Vitamin D (Ergocalciferol)  50,000 Units Oral Q Thu   Continuous Infusions:   Time spent: 25 minutes > 50% bedside involved in family discussions  Marzetta Board, MD [facility] Pager [phone]. If 7 PM - 7 AM, please contact night-coverage at [email or web address], password [facility] 03/11/2015, 9:53 AM  LOS: 8 days

## 2015-03-11 NOTE — Progress Notes (Signed)
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 Last partial dose chemo 5/5, last xrt to left temporal area Feb 2016. Had remote h/o sinus surgery from which she had CSF leak w/ prior repair. 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 Surgery was uneventful but she was kept in the hospital over night as she had post-op sats in low 90s and needed pain management. Developed fever and had asymptomatic hypoxia w/ room air sats 70s on 5/13. Given IVFs, ABX widened. PCCM asked to see the following day for hypoxia and concern for sepsis. Found to have ATX thought due to suspected OSA, nasal packing, and possibly pain medications.   SIGNIFICANT EVENTS  5/11: bilateral sinus surgery and Draf III sinusotomy w/ removal of fronto-ethmoid mucocele  STUDIES:  Upper extremity doppler 5/15> No evidence upper extremity DVT Echo 5/16 >>>  SUBJECTIVE: Feels "loopy" but better from a respiratory standpoint.  VITAL SIGNS: Temp:  [98.6 F (37 C)-99.6 F (37.6 C)] 98.6 F (37 C) (05/19 0557) Pulse Rate:  [74-99] 99 (05/19 0557) Resp:  [18] 18 (05/19 0557) BP: (95-111)/(58-61) 110/60 mmHg (05/19 0557) SpO2:  [95 %-96 %] 95 % (05/19 0557) FiO2 (%):  [60 %] 60 % (05/19 0557) Weight:  [84.4 kg (186 lb 1.1 oz)] 84.4 kg (186 lb 1.1 oz) (05/19 0557)  PHYSICAL EXAMINATION: General:  49 year old female, s/p sinus surgery, currently not in any distress.  Neuro:  Awake, alert, oriented. No focal def  Head: Bowers/AT EENT:  + facial edema, sinus packing. Otherwise nml cephalic. Cardiovascular: RRR, Nl S1/S2,  -M/R/G. Lungs:  Crackles both bases L > R decreased BS at the right. Abdomen:  Soft, non-tender + bowel sounds  Musculoskeletal:  Intact  Skin:  Trace UE edema   Recent Labs Lab 03/09/15 0520 03/10/15 0412 03/11/15 0542  NA 140 141 140  K 3.8 3.0* 3.1*  CL 107 102 100*  CO2 28 32 33*  BUN <5* 5* 7  CREATININE 0.85 0.93 0.88  GLUCOSE 80 102* 117*   Recent Labs Lab 03/09/15 0520 03/10/15 0412 03/11/15 0542  HGB 8.3* 8.3* 8.1*  HCT 25.9* 25.5* 24.5*  WBC 4.7 5.6 5.4  PLT 98* 124* 135*   ABG    Component Value Date/Time   PHART 7.376 03/06/2015 1440   PCO2ART 46.3* 03/06/2015 1440   PO2ART 61.2* 03/06/2015 1440   HCO3 26.5* 03/06/2015 1440   TCO2 27.9 03/06/2015 1440   ACIDBASEDEF 1.0 08/11/2007 2259   O2SAT 89.7 03/06/2015 1440   Dg Chest Port 1 View  03/10/2015   CLINICAL DATA:  Hypoxia, status post sinus surgery, history of asthma breast malignancy and previous tobacco use.  EXAM: PORTABLE CHEST - 1 VIEW  COMPARISON:  PA and lateral chest of Mar 08, 2015  FINDINGS: There is persistent bilateral pulmonary hypo inflation. The pulmonary interstitial markings remain increased but have slightly improved since yesterday's study. Small amounts of pleural fluid on the right are suspected. The cardiac silhouette is largely obscured. The central pulmonary vascularity is prominent but less conspicuous than on yesterday's study. A Port-A-Cath appliance tip projects over  the midportion of the SVC.  IMPRESSION: Persistent bilateral pulmonary hypoinflation with mild improvement in the appearance of the interstitium. A small right pleural effusion is suspected.   Electronically Signed   By: David  Martinique M.D.   On: 03/10/2015 07:28   ASSESSMENT / PLAN:  Acute respiratory failure secondary to significant atelectasis and immobility. ?PNA ?PE > BUE edema, dopplers negative. Doubt in setting hypoinflation and ATX on CXR -Continue aggressive pulmonary hygiene -Supplemental O2 with face  tent to maintain SpO2 > 92% -Continue empiric abx for now.  OSA - Unfortunately the pt is not able to wear cpap post sinus surgery. Can use supplemental oxygen by face tent during sleep. 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  Developed significant atelectasis and concern for bibasilar infiltrate.  I reviewed CXR myself, remains hypoinflated and with bibasilar atelectasis.  Hypoxemia: due to bibasilar atelectasis, seriously doubt a PE here specially with a normal echo, hypoxemia easily explainable with low volume and atelectasis.  - Supplemental O2 via tent face mask.  - Titrate O2 for sat of 88-92%.  - Will monitor.  - CXR ordered for AM.  - Additional diureses as ordered.  Atelectasis: due to lack of mobility.  - IS per RT protocol.  - OOB to chair.  - Ambulate aggressively with PT.  - Flutter valve ordered.  - Mucinex.  - Mucomyst.  - Vibravest.  - If atelectasis persist by tomorrow AM then will orally bronchoscope patient as RLL failed to open.  OSA:  - No CPAP for now.  - Supplemental O2 only.  - Will need input from ENT as to when we can use CPAP again.  HCAP:   - Continue vanc and zosyn.  - F/U on cultures.  Hypokalemia: new and worsening.  - K-Dur as ordered.  - BMET in AM.  Lower ext edema, positive 3L overnight.  - Lasix 20 mg IV q8 x2 doses.  - BMET in AM.  Discussed with with NP.  Rush Farmer, M.D. [facility] Pulmonary/Critical Care Medicine. Pager: [phone]. After hours pager: [phone].

## 2015-03-11 NOTE — Progress Notes (Signed)
PT Cancellation Note  Patient Details Name: Bianca Kent MRN: [id number] DOB: [birth year]   Cancelled Treatment:    Reason Eval/Treat Not Completed: Medical issues which prohibited therapy (patient with increased O2 demand currently requiring 60% FiO2 at this time, will hold )   Duncan Dull 03/11/2015, 1:28 PM Alben Deeds, [place] DPT  [phone]

## 2015-03-11 NOTE — Evaluation (Signed)
Occupational Therapy Evaluation Patient Details Name: Bianca Kent MRN: [id number] DOB: Mar 21, 1966 Today's Date: 03/11/2015    History of Present Illness Patient is a 49 yo female with complex medical history significant for metastatic breast cancer with mets to the brain undergoing chemotherapy. Patient is s/p bilateral sinus surgery and Draf III sinusotomy w/ removal of fronto-ethmoid mucocele on 5/11 afterwhich patient developed hypoxia and acute respiroatory failure secondary to significant atelectasis.   Clinical Impression   Pt admitted with left frontoethmoid mucocele, chronic sinusitis, diplopia with MRI showing known brain metastases from breast cancer and a left frontoethmoid mucocele with mass effect on the left orbit/lamina papyracea. Pt underwent surgery to correct this. Per pt's sister the MD thinks that the diplopia should get better with time and swelling/packing diminishing. In the short term I have asked for a patch for pt to wear alternating eyes one hour on and one hour off during waking hours as she needs it--will continue to address this issue. Also need to address hand strengthening. Recommend follow up OPOT.     Follow Up Recommendations  Outpatient OT    Equipment Recommendations   (pt to get tub seat on her own)       Precautions / Restrictions Precautions Precautions: Fall Precaution Comments: watch O2 sats Restrictions Weight Bearing Restrictions: No      Mobility Bed Mobility               General bed mobility comments: Pt up walking in hallway with family upon my arrival to see her.  Transfers Overall transfer level: Needs assistance Equipment used: Rolling walker (2 wheeled) Transfers: Sit to/from Stand Sit to Stand: Min guard                   ADL Overall ADL's : Needs assistance/impaired Eating/Feeding: Independent;Sitting   Grooming: Set up;Supervision/safety;Standing (did talk about using a rinse cup with straw to prevent  her from tipping her head back so much which causes pain and spit cup to avoid bending over so there is not so much pressure on her face from surgery)   Upper Body Bathing: Set up;Supervision/ safety;Sitting   Lower Body Bathing: Set up;Supervison/ safety (with min guard A sit<>stand; crossing legs to wash feet and lower )   Upper Body Dressing : Set up;Sitting   Lower Body Dressing: Set up;Supervision/safety (with min guard A sit<>stand; crossing legs to don/doff socks)   Toilet Transfer: Min guard;Ambulation   Toileting- Clothing Manipulation and Hygiene: Min guard;Sit to/from stand               Vision Vision Assessment?: Yes Eye Alignment: Impaired (comment) (disconjugate gaze vertically) Ocular Range of Motion: Restricted looking up;Restricted looking down (has pain and will not tolerate these movements except for split seconds) Alignment/Gaze Preference: Within Defined Limits Tracking/Visual Pursuits: Decreased smoothness of eye movement to LEFT superior field;Decreased smoothness of eye movement to LEFT inferior field;Decreased smoothness of eye movement to RIGHT inferior field;Decreased smoothness of eye movement to RIGHT superior field (did not test each eye separately) Diplopia Assessment: Disappears with one eye closed;Objects split on top of one another;Present in near gaze;Present in far gaze (objects are 1 and1/2 (not completely 2))          Pertinent Vitals/Pain Pain Assessment: Faces Faces Pain Scale: Hurts even more Pain Location: eyes when she looks up and down Pain Descriptors / Indicators: Throbbing Pain Intervention(s): Monitored during session;Limited activity within patient's tolerance     Hand Dominance Right  Extremity/Trunk Assessment Upper Extremity Assessment Upper Extremity Assessment:  (in hands, issued pt a squeeze ball--will follow up with therapy and/or hand exerciser)           Communication Communication Communication: No  difficulties   Cognition Arousal/Alertness: Awake/alert Behavior During Therapy: WFL for tasks assessed/performed Overall Cognitive Status: Within Functional Limits for tasks assessed                                Home Living Family/patient expects to be discharged to:: Private residence Living Arrangements: Other relatives Available Help at Discharge: Family;Available 24 hours/day Type of Home: House Home Access: Stairs to enter [employer] of Steps: 3   Home Layout: Two level Alternate Level Stairs-Number of Steps: 12 Alternate Level Stairs-Rails: Can reach both Bathroom Shower/Tub: Walk-in shower;Door   [employer] Toilet: Standard     Home Equipment: None          Prior Functioning/Environment Level of Independence: Independent             OT Diagnosis: Generalized weakness;Disturbance of vision   OT Problem List: Decreased strength;Impaired balance (sitting and/or standing);Impaired vision/perception;Pain   OT Treatment/Interventions: Therapeutic exercise;Visual/perceptual remediation/compensation;Patient/family education;Balance training;Therapeutic activities    OT Goals(Current goals can be found in the care plan section) Acute Rehab OT Goals Patient Stated Goal: to go home OT Goal Formulation: With patient/family Time For Goal Achievement: 03/18/15 Potential to Achieve Goals: Good  OT Frequency: Min 3X/week              End of Session Equipment Utilized During Treatment: Oxygen (45 @14 % on mask)  Activity Tolerance: Patient tolerated treatment well Patient left: in chair;with call bell/phone within reach;with family/visitor present   Time: 3016-0109 OT Time Calculation (min): 33 min Charges:  OT General Charges $OT Visit: 1 Procedure OT Treatments $Therapeutic Activity: 8-22 mins  Almon Register [phone] 03/11/2015, 4:18 PM

## 2015-03-11 NOTE — Care Management Note (Signed)
Case Management Note  Patient Details  Name: Bianca Kent MRN: [id number] Date of Birth: [birth year]  Subjective/Objective:                    Action/Plan: UR updated . DR Reynaldo Minium approved continued stay.   Expected Discharge Date:                  Expected Discharge Plan:  [place]  In-House Referral:     Discharge planning Services     Post Acute Care Choice:    Choice offered to:     DME Arranged:    DME Agency:     HH Arranged:    [place] Agency:     Status of Service:  In process, will continue to follow  Medicare Important Message Given:    Date Medicare IM Given:    Medicare IM give by:    Date Additional Medicare IM Given:    Additional Medicare Important Message give by:     If discussed at [place] of Stay Meetings, dates discussed:  03-11-15   Additional Comments:  Marilu Favre, RN 03/11/2015, 12:43 PM

## 2015-03-12 ENCOUNTER — Encounter (HOSPITAL_COMMUNITY): Payer: Self-pay

## 2015-03-12 ENCOUNTER — Inpatient Hospital Stay (HOSPITAL_COMMUNITY): Payer: 59

## 2015-03-12 DIAGNOSIS — C50919 Malignant neoplasm of unspecified site of unspecified female breast: Secondary | ICD-10-CM

## 2015-03-12 LAB — POCT I-STAT 3, ART BLOOD GAS (G3+)
Acid-Base Excess: 9 mmol/L — ABNORMAL HIGH (ref 0.0–2.0)
BICARBONATE: 35.3 meq/L — AB (ref 20.0–24.0)
O2 Saturation: 94 %
PCO2 ART: 54.2 mmHg — AB (ref 35.0–45.0)
Patient temperature: 98.6
TCO2: 37 mmol/L (ref 0–100)
pH, Arterial: 7.421 (ref 7.350–7.450)
pO2, Arterial: 71 mmHg — ABNORMAL LOW (ref 80.0–100.0)

## 2015-03-12 LAB — CBC
HCT: 27.6 % — ABNORMAL LOW (ref 36.0–46.0)
HEMOGLOBIN: 8.8 g/dL — AB (ref 12.0–15.0)
MCH: 27.7 pg (ref 26.0–34.0)
MCHC: 31.9 g/dL (ref 30.0–36.0)
MCV: 86.8 fL (ref 78.0–100.0)
PLATELETS: 167 10*3/uL (ref 150–400)
RBC: 3.18 MIL/uL — AB (ref 3.87–5.11)
RDW: 18.9 % — ABNORMAL HIGH (ref 11.5–15.5)
WBC: 7.2 10*3/uL (ref 4.0–10.5)

## 2015-03-12 LAB — CULTURE, BLOOD (ROUTINE X 2)
CULTURE: NO GROWTH
Culture: NO GROWTH

## 2015-03-12 LAB — BASIC METABOLIC PANEL
Anion gap: 11 (ref 5–15)
BUN: 9 mg/dL (ref 6–20)
CALCIUM: 8.7 mg/dL — AB (ref 8.9–10.3)
CO2: 32 mmol/L (ref 22–32)
CREATININE: 0.91 mg/dL (ref 0.44–1.00)
Chloride: 96 mmol/L — ABNORMAL LOW (ref 101–111)
GFR calc Af Amer: >60 mL/min (ref >60)
GLUCOSE: 85 mg/dL (ref 65–99)
Potassium: 4.1 mmol/L (ref 3.5–5.1)
SODIUM: 139 mmol/L (ref 135–145)

## 2015-03-12 LAB — PHOSPHORUS: PHOSPHORUS: 5.1 mg/dL — AB (ref 2.5–4.6)

## 2015-03-12 LAB — MAGNESIUM: Magnesium: 2.1 mg/dL (ref 1.7–2.4)

## 2015-03-12 LAB — D-DIMER, QUANTITATIVE: D-Dimer, Quant: 3.99 ug/mL-FEU — ABNORMAL HIGH (ref 0.00–0.48)

## 2015-03-12 MED ORDER — IOHEXOL 350 MG/ML SOLN
80.0000 mL | Freq: Once | INTRAVENOUS | Status: AC | PRN
  Administered 2015-03-12: 80 mL via INTRAVENOUS

## 2015-03-12 MED ORDER — DRONABINOL 2.5 MG PO CAPS
10.0000 mg | ORAL_CAPSULE | Freq: Three times a day (TID) | ORAL | Status: DC
  Administered 2015-03-12 – 2015-03-22 (×27): 10 mg via ORAL
  Filled 2015-03-12 (×28): qty 4

## 2015-03-12 MED ORDER — DIPHENHYDRAMINE HCL 50 MG/ML IJ SOLN
50.0000 mg | Freq: Once | INTRAMUSCULAR | Status: AC
  Administered 2015-03-12: 50 mg via INTRAVENOUS
  Filled 2015-03-12: qty 1

## 2015-03-12 NOTE — Progress Notes (Signed)
OT Cancellation Note  Patient Details Name: Bianca Kent MRN: [id number] DOB: 1966-07-26   Cancelled Treatment:    Reason Eval/Treat Not Completed: Medical issues which prohibited therapy (Will hold today. Per nsg. Being worked up for [place])  Bruce, OTR/L  [phone] 03/12/2015 03/12/2015, 4:28 PM

## 2015-03-12 NOTE — Progress Notes (Signed)
Report called to [facility] on 2 Heart. Patient will transfer on bed. Patient's belongs with patient's mother and sister.

## 2015-03-12 NOTE — Progress Notes (Signed)
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 Last partial dose chemo 5/5, last xrt to left temporal area Feb 2016. Had remote h/o sinus surgery from which she had CSF leak w/ prior repair. 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 Surgery was uneventful but she was kept in the hospital over night as she had post-op sats in low 90s and needed pain management. Developed fever and had asymptomatic hypoxia w/ room air sats 70s on 5/13. Given IVFs, ABX widened. PCCM asked to see the following day for hypoxia and concern for sepsis. Found to have ATX thought due to suspected OSA, nasal packing, and possibly pain medications.   SIGNIFICANT EVENTS  5/11: bilateral sinus surgery and Draf III sinusotomy w/ removal of fronto-ethmoid mucocele  STUDIES:  Upper extremity doppler 5/15> No evidence upper extremity DVT Echo 5/36 >> nml LV systolic fxn, EF 64-40%, no RWMA, PA wnl  SUBJECTIVE:   VITAL SIGNS: Temp:  [97.4 F (36.3 C)-99.6 F (37.6 C)] 98.9 F (37.2 C) (05/20 0539) Pulse Rate:  [84-111] 84 (05/20 0539) Resp:  [17-18] 17 (05/20 0539) BP: (105-115)/(57-62) 105/62 mmHg (05/20 0539) SpO2:  [88 %-99 %] 99 % (05/20 0941) FiO2 (%):  [60 %-98 %] 60 % (05/20 0941) Weight:  [187 lb 2.7 oz (84.9 kg)] 187 lb 2.7 oz (84.9 kg) (05/20 0539)  PHYSICAL EXAMINATION: General:  49 year old female, s/p sinus surgery, currently not in any distress.  Neuro:  Awake, alert, oriented. No focal def  Head: [place]/AT EENT:  + facial edema, sinus packing. Otherwise nml cephalic. Cardiovascular: RRR, Nl S1/S2, -M/R/G. Lungs:  Crackles both  bases L > R decreased BS at the right. Abdomen:  Soft, non-tender + bowel sounds  Musculoskeletal:  Intact  Skin:  Trace UE edema   Recent Labs Lab 03/10/15 0412 03/11/15 0542 03/12/15 0342  NA 141 140 139  K 3.0* 3.1* 4.1  CL 102 100* 96*  CO2 32 33* 32  BUN 5* 7 9  CREATININE 0.93 0.88 0.91  GLUCOSE 102* 117* 85    Recent Labs Lab 03/10/15 0412 03/11/15 0542 03/12/15 0342  HGB 8.3* 8.1* 8.8*  HCT 25.5* 24.5* 27.6*  WBC 5.6 5.4 7.2  PLT 124* 135* 167   ABG    Component Value Date/Time   PHART 7.376 03/06/2015 1440   PCO2ART 46.3* 03/06/2015 1440   PO2ART 61.2* 03/06/2015 1440   HCO3 26.5* 03/06/2015 1440   TCO2 27.9 03/06/2015 1440   ACIDBASEDEF 1.0 08/11/2007 2259   O2SAT 89.7 03/06/2015 1440   Dg Chest Port 1 View  03/12/2015   CLINICAL DATA:  Hypoxemia.  EXAM: PORTABLE CHEST - 1 VIEW  COMPARISON:  03/10/2015  FINDINGS: Left chest port remains in place, tip in the SVC. Lung volumes remain low, there is elevation of right hemidiaphragm. Unchanged blunting of right costophrenic angle. Cardiomediastinal contours are unchanged, however partially obscured. Persist increased interstitial opacities, however improved from prior. Bibasilar atelectasis, left greater than right, not significantly changed.  IMPRESSION: 1. Mild radiographic improvement of the chest with persistent but decreasing interstitial opacities. 2. Lung volumes remain low with bibasilar atelectasis, unchanged.  Electronically Signed   By: Jeb Levering M.D.   On: 03/12/2015 06:38   ASSESSMENT / PLAN:  Acute respiratory failure secondary to significant atelectasis and immobility. Hypoxemia - likely secondary to atelectasis, ddx includes PE  ?PNA ?PE > BUE edema, dopplers negative. Doubt in setting hypoinflation and ATX on CXR  Plan: -Continue aggressive pulmonary hygiene:  Flutter valve, mobilize etc -Supplemental O2 with face tent to maintain SpO2 > 92% -Continue empiric abx for now. -Negative  balance as tolerated by BP / Renal fxn -OOB -Mucomyst -Vibravest -will hold off on bronchoscopy for now -assess SNIF test 5/20 -D-Dimer positive, assess CTA chest to rule out PE (have discussed dye reaction with patient and she has had dye in the past and does fine with it if she has benadryl prior to exposure).  Discussed with attending MD.  Will premed with 50 mg benadryl and eval for PE   OSA  Plan:  - Unfortunately the pt is not able to wear cpap post sinus surgery. Can use supplemental oxygen by face tent during sleep. 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  Patient states she needs to get her affairs in order and wants to make sure her family has access to her financial affairs if needed.  She also wants to discuss EOL wishes for when the time comes.  I explained to her that currently her illness is reversible (surgery, atelectasis post surgery etc) and she understands but knows she has limited time due to metastatic breast cancer.  Extensive discussion with patient and family at bedside regarding goals of care.  She would like to speak with social work in hopes to assist with financial issues and Palliative Care for goals of care.     Noe Gens, NP-C [facility] Pgr: [phone] or [phone]  Attending note:  49 year old female with recent sinus surgery who developed hypoxemic respiratory failure. Developed significant atelectasis and concern for bibasilar infiltrate.  Diuresed quite a bit overnight.  K remains stable.  I reviewed CXR myself, remains hypoinflated and with bibasilar atelectasis more right than left.  Hypoxemia: due to bibasilar atelectasis, seriously doubt a PE here specially with a normal echo, hypoxemia easily explainable with low volume  and atelectasis.  Concern for right diaphragmatic paralysis.  Worsening hypoxemia overnight, now on 100% FiO2 - Supplemental O2 via tent face mask. - Titrate O2 for sat of 88-92%. - Will monitor. - CXR ordered for AM. - Additional diureses as ordered.  - SNIFF test ordered.  - Transfer to SDU for closer observation.  - Chest CT to r/o PE ordered with premedication given contrast allergy.  Atelectasis: due to lack of mobility. - IS per RT protocol. - OOB to chair. - Ambulate aggressively with PT. - Flutter valve ordered. - Mucinex. - Mucomyst. - Vibravest. - SNIFF test ordered.  OSA: - No CPAP for now. - Supplemental O2 only. - Will need input from ENT as to when we can use CPAP again.  HCAP:  - Continue vanc and zosyn. - F/U on cultures.  Hypokalemia: Stable - K-Dur as ordered. - BMET in AM.  Lower ext edema, negative 3L overnight. - Hold further lasix - BMET in AM.  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  Discussed code status but will get formal palliative care consult as patient is not actively dying as we speak.  Discussed with with NP.  Rush Farmer, M.D. [facility]. Pager: [phone]. After hours pager: [phone].

## 2015-03-12 NOTE — Clinical Social Work Note (Signed)
CSW received referral for patient who had financial questions.  CSW did not have time to see patient, will ask weekend CSW to talk to patient and see what questions she had.  Jones Broom. [place], MSW, [place] 03/12/2015 5:06 PM

## 2015-03-12 NOTE — Progress Notes (Signed)
Chaplain Note:   Chaplain met with pt's sister. Chaplain visited earlier around [place] but pt wasn't in the room.   Pt's sister noted the pt's medical history and time here. She noted the stressors and noted that "I'm not ready to let her go, I don't think I'll ever be prepared.   Chaplain will recommend follow-up.   Delford Field, Chaplain 03/12/2015

## 2015-03-12 NOTE — Progress Notes (Signed)
PT Cancellation Note  Patient Details Name: Bianca Kent MRN: [id number] DOB: 1966/03/05   Cancelled Treatment:    Reason Eval/Treat Not Completed: Medical issues which prohibited therapy Following up for PT treatment, patient was actively being transferred to [facility] due to decline in respiratory status over night. Will hold therapy at this time.  Ellouise Newer 03/12/2015, 1:10 PM Camille [place], [place]

## 2015-03-12 NOTE — Progress Notes (Signed)
PROGRESS NOTE  Bianca Kent SKA:[id number] DOB: 01-Jun-1966 DOA: 03/03/2015 PCP: [name], MD  HPI: 49 year old female with a history of metastatic breast cancer to the brain (Dr. [name]), asthma, depression, GERD, was admitted on 03/03/2015 for elective outpatient sinus surgery. The patient underwent a left frontal ethmoid mucocele drainage performed by Dr. [name]. During the surgery, the patient also had her anterior Reform CSF leak from the mucocele repaired with cautery. Postoperatively, the patient has some hypoxemia, and therefore she was kept overnight. On postoperative day #2, the patient's oxygen saturations have somewhat improved and the patient was about to be discharged home. Unfortunately, the patient began developing fever and subsequently was found to have hypoxemia with oxygen saturations in the mid 70s on room air.   Subjective / 24 H Interval events - increased FIO2 to 100% overnight, patient endorses episodes of dyspnea - mother at bedside reports that patient was confused last night, patient endorses "lost time" yesterday when she has no recollection - no chest pain, no abdominal pain, nausea or vomiting  Assessment/Plan: Active Problems:   Post-operative state   Acute respiratory failure with hypoxia   Postprocedural fever   Breast cancer, stage 4   Thrombocytopenia   Confusion   Hypoxemia   HCAP (healthcare-associated pneumonia)   Acute respiratory failure   PNA (pneumonia)   Acute hypoxic respiratory failure - ongoing, with worsening FIO2, will transfer to SDU this morning - discussed with Dr. Nelda Marseille this morning, appreciate pulmonology input - patient is allergic to Iohexol as well as prednisone. I discussed with radiology and recommended for a VQ scan rather than CT angio. Will first obtain a D dimer and if positive will send for a VQ scan.  - has recently received chemotherapy, continue broad spectrum antibiotics, on Vancomycin and Zosyn  since 5/13 - CXR this morning independently reviewed, essentially unchanged  Edema - she is up 20 lbs since admission - continue IV Lasix, good urine output per I&O however weight seems about the same this morning, clinically she is still fluid overloaded - continue daily weights - no evidence of heart failure on 2D echo - renal function remains good while receiving Lasix  Hypokalemia - due to Lasix, K normal today, monitor  Oral thrush and odynophagia,  - continue iv diflucan, acyclovir stopped 5/19 (started by oncologist Dr. [name])  Thrombocytopenia - from chemo vs infection vs consumption.  - resolved   Metastatic breast cancer with brain mets - Reported has not been getting full dose chemo due to recent illness, followed by Dr. [name].  S/p bilateral sinus surgery and Draf III frontal sinusotomy 03/03/2015. - Per ENT  Constipation:  - stool softener - improved   Diet: Diet regular Room service appropriate?: Yes; Fluid consistency:: Thin Fluids: none  DVT Prophylaxis: lovenox on 5/15 stopped due to worsening of thrombocytopenia. Resumed 5/18  Code Status: No Order Family Communication: d/w mother bedside  Disposition Plan: transfer to SDU  Consultants:  PCCM  Procedures:  bilateral sinus surgery and Draf III frontal sinusotomy 03/03/2015   Antibiotics Vancomycin 5/13 >> Zosyn 5/13 >>   Studies  Dg Chest Port 1 View  03/12/2015   CLINICAL DATA:  Hypoxemia.  EXAM: PORTABLE CHEST - 1 VIEW  COMPARISON:  03/10/2015  FINDINGS: Left chest port remains in place, tip in the SVC. Lung volumes remain low, there is elevation of right hemidiaphragm. Unchanged blunting of right costophrenic angle. Cardiomediastinal contours are unchanged, however partially obscured. Persist increased interstitial opacities, however improved from  prior. Bibasilar atelectasis, left greater than right, not significantly changed.  IMPRESSION: 1. Mild radiographic improvement of the chest  with persistent but decreasing interstitial opacities. 2. Lung volumes remain low with bibasilar atelectasis, unchanged.   Electronically Signed   By: Jeb Levering M.D.   On: 03/12/2015 06:38   Objective  Filed Vitals:   03/11/15 2203 03/11/15 2220 03/12/15 0300 03/12/15 0539  BP:    105/62  Pulse:    84  Temp:    98.9 F (37.2 C)  TempSrc:    Oral  Resp:    17  Height:      Weight:    84.9 kg (187 lb 2.7 oz)  SpO2: 88% 92% 92% 99%    Intake/Output Summary (Last 24 hours) at 03/12/15 0733 Last data filed at 03/12/15 0554  Gross per 24 hour  Intake   1350 ml  Output   4700 ml  Net  -3350 ml   Filed Weights   03/03/15 1052 03/11/15 0557 03/12/15 0539  Weight: 75.297 kg (166 lb) 84.4 kg (186 lb 1.1 oz) 84.9 kg (187 lb 2.7 oz)   Exam:  General:  NAD  HEENT: no scleral icterus, PERRL  Cardiovascular: RRR without MRG, 2+ peripheral pulses, 2+ pitting LE edema  Respiratory: shallow breathing, no wheezing, decreased sounds at the bases  Abdomen: soft, non tender, BS +, no guarding  MSK/Extremities: no clubbing/cyanosis, no joint swelling  Skin: no rashes  Neuro: non focal  Data Reviewed: Basic Metabolic Panel:  Recent Labs Lab 03/08/15 0145 03/09/15 0520 03/10/15 0412 03/11/15 0542 03/12/15 0342  NA 140 140 141 140 139  K 3.4* 3.8 3.0* 3.1* 4.1  CL 105 107 102 100* 96*  CO2 27 28 32 33* 32  GLUCOSE 81 80 102* 117* 85  BUN 5* <5* 5* 7 9  CREATININE 0.73 0.85 0.93 0.88 0.91  CALCIUM 8.1* 8.0* 8.2* 8.0* 8.7*  MG 2.0 2.0 2.1 2.3 2.1  PHOS  --   --   --  4.4 5.1*   Liver Function Tests:  Recent Labs Lab 03/05/15 1500 03/06/15 0425  AST 60* 41  ALT 51 38  ALKPHOS 278* 238*  BILITOT 0.7 0.4  PROT 6.4* 6.3*  ALBUMIN 2.8* 2.5*   CBC:  Recent Labs Lab 03/08/15 0145 03/09/15 0520 03/10/15 0412 03/11/15 0542 03/12/15 0342  WBC 6.5 4.7 5.6 5.4 7.2  HGB 8.5* 8.3* 8.3* 8.1* 8.8*  HCT 26.6* 25.9* 25.5* 24.5* 27.6*  MCV 87.5 88.4 86.7 86.3 86.8    PLT 106* 98* 124* 135* 167   BNP (last 3 results)  Recent Labs  03/05/15 1851  BNP 48.8    Recent Results (from the past 240 hour(s))  Culture, routine-sinus     Status: None   Collection Time: 03/03/15  3:58 PM  Result Value Ref Range Status   Specimen Description NASAL SWAB  Final   Special Requests NONE  Final   Culture   Final    FEW VIRIDANS STREPTOCOCCUS Note: SOURCE IS SINUS Performed at [facility]    Report Status 03/08/2015 FINAL  Final  MRSA PCR Screening     Status: None   Collection Time: 03/03/15 10:24 PM  Result Value Ref Range Status   MRSA by PCR NEGATIVE NEGATIVE Final    Comment:        The GeneXpert MRSA Assay (FDA approved for NASAL specimens only), is one component of a comprehensive MRSA colonization surveillance program. It is not intended to diagnose  MRSA infection nor to guide or monitor treatment for MRSA infections.   Culture, blood (routine x 2)     Status: None (Preliminary result)   Collection Time: 03/05/15  3:00 PM  Result Value Ref Range Status   Specimen Description BLOOD LEFT HAND  Final   Special Requests BOTTLES DRAWN AEROBIC AND ANAEROBIC 10CC  Final   Culture   Final           BLOOD CULTURE RECEIVED NO GROWTH TO DATE CULTURE WILL BE HELD FOR 5 DAYS BEFORE ISSUING A FINAL NEGATIVE REPORT Performed at [facility]    Report Status PENDING  Incomplete  Culture, blood (routine x 2)     Status: None (Preliminary result)   Collection Time: 03/05/15  3:10 PM  Result Value Ref Range Status   Specimen Description BLOOD RIGHT HAND  Final   Special Requests Reviewed by Louanne Belton. Rodney Cruise, M.D. [facility]  Final   Culture   Final           BLOOD CULTURE RECEIVED NO GROWTH TO DATE CULTURE WILL BE HELD FOR 5 DAYS BEFORE ISSUING A FINAL NEGATIVE REPORT Note: Culture results may be compromised due to an inadequate volume of blood received in culture bottles. Performed at [facility]    Report Status PENDING   Incomplete  Urine culture     Status: None   Collection Time: 03/05/15  7:00 PM  Result Value Ref Range Status   Specimen Description URINE, CLEAN CATCH  Final   Special Requests NONE  Final   Colony Count NO GROWTH Performed at [facility]   Final   Culture NO GROWTH Performed at [facility]   Final   Report Status 03/06/2015 FINAL  Final     Scheduled Meds: . acetylcysteine  4 mL Nebulization TID  . acidophilus  2 capsule Oral BID  . albuterol  2.5 mg Nebulization TID  . antiseptic oral rinse  7 mL Mouth Rinse BID  . calcium carbonate  2 tablet Oral BID  . dextromethorphan-guaiFENesin  1 tablet Oral BID  . dronabinol  10 mg Oral TID AC  . enoxaparin (LOVENOX) injection  40 mg Subcutaneous Q24H  . feeding supplement (ENSURE ENLIVE)  237 mL Oral TID BM  . fentaNYL  50 mcg Transdermal Q72H  . fluconazole (DIFLUCAN) IV  200 mg Intravenous Q24H  . lactobacillus  1 g Oral TID WC  . lidocaine  15 mL Mouth/Throat Q4H  . methylphenidate  10 mg Oral BID  . multivitamin with minerals  1 tablet Oral Daily  . OLANZapine  5 mg Oral QHS  . pantoprazole  40 mg Oral Daily  . piperacillin-tazobactam (ZOSYN)  IV  3.375 g Intravenous Q8H  . polyethylene glycol  17 g Oral Daily  . scopolamine  1 patch Transdermal Q72H  . senna-docusate  2 tablet Oral BID  . sodium chloride  10-40 mL Intracatheter Q12H  . vancomycin  750 mg Intravenous Q8H  . Vitamin D (Ergocalciferol)  50,000 Units Oral Q Thu   Continuous Infusions:   Marzetta Board, MD [facility] Pager [phone]. If 7 PM - 7 AM, please contact night-coverage at [email or web address], password [facility] 03/12/2015, 7:33 AM  LOS: 9 days

## 2015-03-13 DIAGNOSIS — J986 Disorders of diaphragm: Secondary | ICD-10-CM

## 2015-03-13 LAB — MAGNESIUM: Magnesium: 2.5 mg/dL — ABNORMAL HIGH (ref 1.7–2.4)

## 2015-03-13 LAB — BASIC METABOLIC PANEL
ANION GAP: 7 (ref 5–15)
BUN: 7 mg/dL (ref 6–20)
CO2: 36 mmol/L — ABNORMAL HIGH (ref 22–32)
Calcium: 8.9 mg/dL (ref 8.9–10.3)
Chloride: 99 mmol/L — ABNORMAL LOW (ref 101–111)
Creatinine, Ser: 0.85 mg/dL (ref 0.44–1.00)
GFR calc non Af Amer: >60 mL/min (ref >60)
Glucose, Bld: 78 mg/dL (ref 65–99)
Potassium: 3.9 mmol/L (ref 3.5–5.1)
SODIUM: 142 mmol/L (ref 135–145)

## 2015-03-13 LAB — CBC
HCT: 24.8 % — ABNORMAL LOW (ref 36.0–46.0)
Hemoglobin: 7.8 g/dL — ABNORMAL LOW (ref 12.0–15.0)
MCH: 27.9 pg (ref 26.0–34.0)
MCHC: 31.5 g/dL (ref 30.0–36.0)
MCV: 88.6 fL (ref 78.0–100.0)
PLATELETS: 156 10*3/uL (ref 150–400)
RBC: 2.8 MIL/uL — ABNORMAL LOW (ref 3.87–5.11)
RDW: 18.7 % — AB (ref 11.5–15.5)
WBC: 6 10*3/uL (ref 4.0–10.5)

## 2015-03-13 MED ORDER — ALBUTEROL SULFATE (2.5 MG/3ML) 0.083% IN NEBU
2.5000 mg | INHALATION_SOLUTION | Freq: Three times a day (TID) | RESPIRATORY_TRACT | Status: DC
  Administered 2015-03-13 – 2015-03-14 (×3): 2.5 mg via RESPIRATORY_TRACT
  Filled 2015-03-13 (×4): qty 3

## 2015-03-13 MED ORDER — BUDESONIDE 0.25 MG/2ML IN SUSP
0.2500 mg | Freq: Three times a day (TID) | RESPIRATORY_TRACT | Status: DC
  Administered 2015-03-13 – 2015-03-14 (×3): 0.25 mg via RESPIRATORY_TRACT
  Filled 2015-03-13 (×7): qty 2

## 2015-03-13 MED ORDER — ALBUTEROL SULFATE (2.5 MG/3ML) 0.083% IN NEBU
2.5000 mg | INHALATION_SOLUTION | RESPIRATORY_TRACT | Status: DC | PRN
  Administered 2015-03-13: 2.5 mg via RESPIRATORY_TRACT

## 2015-03-13 NOTE — Progress Notes (Signed)
PROGRESS NOTE  Bianca Kent FUX:[id number] DOB: 29-Sep-1966 DOA: 03/03/2015 PCP: [name], MD  HPI: 49 year old female with a history of metastatic breast cancer to the brain (Dr. [name]), asthma, depression, GERD, was admitted on 03/03/2015 for elective outpatient sinus surgery. The patient underwent a left frontal ethmoid mucocele drainage performed by Dr. [name]. During the surgery, the patient also had her anterior Reform CSF leak from the mucocele repaired with cautery. Postoperatively, the patient has some hypoxemia, and therefore she was kept overnight. On postoperative day #2, the patient's oxygen saturations have somewhat improved and the patient was about to be discharged home. Unfortunately, the patient began developing fever and subsequently was found to have hypoxemia with oxygen saturations in the mid 70s on room air.   Subjective / 24 H Interval events - she is feeling short of breath this morning, sister bedside reports intermittent confusion - no chest pain, no abdominal pain, nausea or vomiting  Assessment/Plan: Active Problems:   Post-operative state   Acute respiratory failure with hypoxia   Postprocedural fever   Breast cancer, stage 4   Thrombocytopenia   Confusion   Hypoxemia   HCAP (healthcare-associated pneumonia)   Acute respiratory failure   PNA (pneumonia)   Acute hypoxic and hypercarbic respiratory failure - ongoing, requires 100% FIO2 via mask - CT scan yesterday showed right middle/lower lobes collapse, bilateral opacifications. 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 Images personally reviewed. No PE - has recently received chemotherapy, continue broad spectrum antibiotics, on Vancomycin and Zosyn since 5/13 - discussed with Dr. Alva Garnet this morning - her respiratory status continues to remain very tenuous, closely monitor for decompensation  Edema - 166 lbs on admission 5/11 >> 5/19 186 lbs >> 5/20 178 lbs - continue IV  Lasix, good urine output, renal function remains stable, continue to monitor - continue daily weights - no evidence of heart failure on 2D echo  Hypokalemia - due to Lasix, K normal today, monitor  Oral thrush and odynophagia,  - continue iv diflucan, acyclovir stopped 5/19 (started by oncologist Dr. [name]) - improving  Thrombocytopenia - from chemo vs infection vs consumption.  - resolved   Metastatic breast cancer with brain mets - Reported has not been getting full dose chemo due to recent illness, followed by Dr. [name]. - discussed about her care with Dr. [name] this morning.   S/p bilateral sinus surgery and Draf III frontal sinusotomy 03/03/2015. - Per ENT  Constipation:  - stool softener   Diet: Diet regular Room service appropriate?: Yes; Fluid consistency:: Thin Fluids: none  DVT Prophylaxis: lovenox on 5/15 stopped due to worsening of thrombocytopenia. Resumed 5/18   Code Status: No Order Family Communication: d/w sister bedside  Disposition Plan: remain in SDU  Consultants:  PCCM  Procedures:  bilateral sinus surgery and Draf III frontal sinusotomy 03/03/2015   Antibiotics Vancomycin 5/13 >> Zosyn 5/13 >>   Studies  Ct Angio Chest Pe W/cm &/or Wo Cm  03/12/2015   CLINICAL DATA:  Patient with history of metastatic breast cancer. Status post recent cranial surgery now with elevated D-dimer and hypoxemia.  EXAM: CT ANGIOGRAPHY CHEST WITH CONTRAST  TECHNIQUE: Multidetector CT imaging of the chest was performed using the standard protocol during bolus administration of intravenous contrast. Multiplanar CT image reconstructions and MIPs were obtained to evaluate the vascular anatomy.  CONTRAST:  22mL OMNIPAQUE IOHEXOL 350 MG/ML SOLN  COMPARISON:  PET-CT 12/22/2014; chest CT 11/02/2014  FINDINGS: Mediastinum/Nodes: No significant interval change in size of 9  mm right axillary lymph node (image 110; series 6). Multiple stable sub cm left axillary lymph  nodes. Suggestion of new 1.5 cm left hilar lymph node (image 42; series 5). Normal heart size. No pericardial effusion. Aorta and main pulmonary artery are normal in caliber. Left anterior chest wall Port-A-Cath with tip projecting in right atrium.  Opacification of the pulmonary arteries. No definite evidence for pulmonary embolism. Evaluation of the distal segmental and subsegmental pulmonary arteries limited due to motion artifact.  Lungs/Pleura: Central airways are patent. No significant interval change in right lower lobe and right middle lobe consolidation. Interval increase patchy consolidative opacity within the right upper lobe. Interval development of extensive consolidative opacity within the left lower lobe. Additionally there is scattered ground-glass and consolidative opacification within the left upper lobe. No definite pleural effusion or pneumothorax.  Upper abdomen: Patient status post cholecystectomy. Visualized adrenal glands are unremarkable.  Musculoskeletal: Re- demonstrated osseous metastatic disease particularly involving the visualized thoracic spine. Re- demonstrated skin thickening and parenchymal thickening involving the right breast.  Review of the MIP images confirms the above findings.  IMPRESSION: 1. No definite evidence for pulmonary embolism. Evaluation of the distal segmental and subsegmental pulmonary arteries limited due to motion artifact. 2. Interval development of extensive pulmonary consolidation throughout left lung particularly involving the left lower lobe. Findings are nonspecific however may be secondary to an infectious or inflammatory process. Additionally there are new enlarged left hilar lymph nodes which are nonspecific however potentially secondary to the infectious or inflammatory process. Continued radiographic follow-up is recommended to ensure resolution. 3. Suggestion of increased opacification within the right upper lobe which may be secondary to radiation  pneumonitis or potentially an infectious/inflammatory process. Metastatic disease not excluded. Recommend attention on followup. 4. Unchanged collapse/consolidation within the right middle and lower lobes with right hemidiaphragm elevation.   Electronically Signed   By: Lovey Newcomer M.D.   On: 03/12/2015 19:23   Dg Sniff Test  03/12/2015   CLINICAL DATA:  Respiratory difficulty.  EXAM: CHEST FLUOROSCOPY  TECHNIQUE: Real-time fluoroscopic evaluation of the chest was performed.  RADIOPHARMACEUTICALS:  None administered  FLUOROSCOPY TIME:  48 seconds  COMPARISON:  Chest radiograph 03/12/2015, CT thorax 11/02/2014  FINDINGS: Initial image demonstrates elevation of the right hemidiaphragm. With fluoroscopic observation of normal breathing, the left diaphragm deflects inferiorly while the right diaphragm remains static. With forceful inspiration, this asymmetric movement is exaggerated.  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   Electronically Signed   By: Suzy Bouchard M.D.   On: 03/12/2015 14:26   Dg Chest Port 1 View  03/12/2015   CLINICAL DATA:  Hypoxemia.  EXAM: PORTABLE CHEST - 1 VIEW  COMPARISON:  03/10/2015  FINDINGS: Left chest port remains in place, tip in the SVC. Lung volumes remain low, there is elevation of right hemidiaphragm. Unchanged blunting of right costophrenic angle. Cardiomediastinal contours are unchanged, however partially obscured. Persist increased interstitial opacities, however improved from prior. Bibasilar atelectasis, left greater than right, not significantly changed.  IMPRESSION: 1. Mild radiographic improvement of the chest with persistent but decreasing interstitial opacities. 2. Lung volumes remain low with bibasilar atelectasis, unchanged.   Electronically Signed   By: Jeb Levering M.D.   On: 03/12/2015 06:38   Objective  Filed Vitals:   03/12/15 2300 03/13/15 0100 03/13/15 0103 03/13/15 0445  BP: 100/64 86/55 101/59 89/48  Pulse:       Temp: 97.8 F (36.6 C)   98.1 F (36.7 C)  TempSrc: Oral   Oral  Resp: 21 16 22 17   Height:      Weight:      SpO2: 94% 97% 99% 95%    Intake/Output Summary (Last 24 hours) at 03/13/15 0706 Last data filed at 03/13/15 0348  Gross per 24 hour  Intake    940 ml  Output    925 ml  Net     15 ml   Filed Weights   03/11/15 0557 03/12/15 0539 03/12/15 1200  Weight: 84.4 kg (186 lb 1.1 oz) 84.9 kg (187 lb 2.7 oz) 81 kg (178 lb 9.2 oz)   Exam:  General:  NAD  HEENT: no scleral icterus, PERRL  Cardiovascular: RRR without MRG, 2+ peripheral pulses, 2+ pitting LE edema  Respiratory: shallow breathing, no wheezing, decreased sounds at the bases  Abdomen: soft, non tender, BS +, no guarding  MSK/Extremities: no clubbing/cyanosis, no joint swelling  Skin: no rashes  Neuro: non focal  Data Reviewed: Basic Metabolic Panel:  Recent Labs Lab 03/08/15 0145 03/09/15 0520 03/10/15 0412 03/11/15 0542 03/12/15 0342  NA 140 140 141 140 139  K 3.4* 3.8 3.0* 3.1* 4.1  CL 105 107 102 100* 96*  CO2 27 28 32 33* 32  GLUCOSE 81 80 102* 117* 85  BUN 5* <5* 5* 7 9  CREATININE 0.73 0.85 0.93 0.88 0.91  CALCIUM 8.1* 8.0* 8.2* 8.0* 8.7*  MG 2.0 2.0 2.1 2.3 2.1  PHOS  --   --   --  4.4 5.1*   Liver Function Tests: No results for input(s): AST, ALT, ALKPHOS, BILITOT, PROT, ALBUMIN in the last 168 hours. CBC:  Recent Labs Lab 03/08/15 0145 03/09/15 0520 03/10/15 0412 03/11/15 0542 03/12/15 0342  WBC 6.5 4.7 5.6 5.4 7.2  HGB 8.5* 8.3* 8.3* 8.1* 8.8*  HCT 26.6* 25.9* 25.5* 24.5* 27.6*  MCV 87.5 88.4 86.7 86.3 86.8  PLT 106* 98* 124* 135* 167   BNP (last 3 results)  Recent Labs  03/05/15 1851  BNP 48.8    Recent Results (from the past 240 hour(s))  Culture, routine-sinus     Status: None   Collection Time: 03/03/15  3:58 PM  Result Value Ref Range Status   Specimen Description NASAL SWAB  Final   Special Requests NONE  Final   Culture   Final    FEW VIRIDANS  STREPTOCOCCUS Note: SOURCE IS SINUS Performed at [facility]    Report Status 03/08/2015 FINAL  Final  MRSA PCR Screening     Status: None   Collection Time: 03/03/15 10:24 PM  Result Value Ref Range Status   MRSA by PCR NEGATIVE NEGATIVE Final    Comment:        The GeneXpert MRSA Assay (FDA approved for NASAL specimens only), is one component of a comprehensive MRSA colonization surveillance program. It is not intended to diagnose MRSA infection nor to guide or monitor treatment for MRSA infections.   Culture, blood (routine x 2)     Status: None   Collection Time: 03/05/15  3:00 PM  Result Value Ref Range Status   Specimen Description BLOOD LEFT HAND  Final   Special Requests BOTTLES DRAWN AEROBIC AND ANAEROBIC 10CC  Final   Culture   Final    NO GROWTH 5 DAYS Performed at [facility]    Report Status 03/12/2015 FINAL  Final  Culture, blood (routine x 2)     Status: None   Collection Time: 03/05/15  3:10 PM  Result Value Ref Range Status   Specimen Description BLOOD RIGHT HAND  Final   Special Requests BOTTLES DRAWN AEROBIC AND ANAEROBIC 2CC  Final   Culture   Final    NO GROWTH 5 DAYS Note: Culture results may be compromised due to an inadequate volume of blood received in culture bottles. Performed at [facility]    Report Status 03/12/2015 FINAL  Final  Urine culture     Status: None   Collection Time: 03/05/15  7:00 PM  Result Value Ref Range Status   Specimen Description URINE, CLEAN CATCH  Final   Special Requests NONE  Final   Colony Count NO GROWTH Performed at [facility]   Final   Culture NO GROWTH Performed at [facility]   Final   Report Status 03/06/2015 FINAL  Final     Scheduled Meds: . acetylcysteine  4 mL Nebulization TID  . acidophilus  2 capsule Oral BID  . albuterol  2.5 mg Nebulization TID  . antiseptic oral rinse  7 mL Mouth Rinse BID  . calcium carbonate  2 tablet Oral BID  .  dextromethorphan-guaiFENesin  1 tablet Oral BID  . dronabinol  10 mg Oral TID AC  . enoxaparin (LOVENOX) injection  40 mg Subcutaneous Q24H  . feeding supplement (ENSURE ENLIVE)  237 mL Oral TID BM  . fentaNYL  50 mcg Transdermal Q72H  . fluconazole (DIFLUCAN) IV  200 mg Intravenous Q24H  . lactobacillus  1 g Oral TID WC  . lidocaine  15 mL Mouth/Throat Q4H  . methylphenidate  10 mg Oral BID  . multivitamin with minerals  1 tablet Oral Daily  . OLANZapine  5 mg Oral QHS  . pantoprazole  40 mg Oral Daily  . piperacillin-tazobactam (ZOSYN)  IV  3.375 g Intravenous Q8H  . polyethylene glycol  17 g Oral Daily  . senna-docusate  2 tablet Oral BID  . sodium chloride  10-40 mL Intracatheter Q12H  . vancomycin  750 mg Intravenous Q8H  . Vitamin D (Ergocalciferol)  50,000 Units Oral Q Thu   Continuous Infusions:   Marzetta Board, MD [facility] Pager [phone]. If 7 PM - 7 AM, please contact night-coverage at [email or web address], password [email or web address] 03/13/2015, 7:06 AM  LOS: 10 days

## 2015-03-13 NOTE — Consult Note (Signed)
Spoke briefly today with Bianca Kent, her sister and her mother.  Horace was pretty fatigued late this afternoon and did not want to talk long. She told me briefly about discussions she has had with other physicians about goal of comfort and getting home with hospice care.  They asked how to complete advance directive and setup hospice care at home. I have placed consult for spiritual care for advance directive completion as well as care management for home hospice referral.  We really did not discuss much more today but I will come back and see her tomorrow to see if there are other issues and concerns we can address.   Doran Clay D.O. Palliative Medicine Team at [facility]  Pager: [phone] Team Phone: [phone]

## 2015-03-13 NOTE — Progress Notes (Signed)
No overt distress on O2 by face tent with SpO2 in low 90s, occasionally high 80s Denies excessive dyspnea Believes that she is starting ot mobilize secretions  Filed Vitals:   03/13/15 0103 03/13/15 0445 03/13/15 0800 03/13/15 0842  BP: 101/59 89/48 110/53   Pulse:   88   Temp:  98.1 F (36.7 C) 98.6 F (37 C)   TempSrc:  Oral Axillary   Resp: 22 17 24    Height:      Weight:      SpO2: 99% 95% 95% 93%   Fatigued appearing, no overt distress Mildly confused (?) No JVD Diminished BS RLL Scattered crackles and L basilar crackles Reg, no M Abs soft No edema  BMET    Component Value Date/Time   NA 142 03/13/2015 0657   NA 143 02/25/2015 1027   K 3.9 03/13/2015 0657   K 4.2 02/25/2015 1027   CL 99* 03/13/2015 0657   CL 104 02/25/2015 1027   CO2 36* 03/13/2015 0657   CO2 28 02/25/2015 1027   GLUCOSE 78 03/13/2015 0657   GLUCOSE 81 02/25/2015 1027   BUN 7 03/13/2015 0657   BUN 8 02/25/2015 1027   CREATININE 0.85 03/13/2015 0657   CREATININE 0.8 02/25/2015 1027   CALCIUM 8.9 03/13/2015 0657   CALCIUM 9.4 02/25/2015 1027   GFRNONAA >60 03/13/2015 0657   GFRAA >60 03/13/2015 0657    CBC    Component Value Date/Time   WBC 6.0 03/13/2015 0657   WBC 5.7 02/25/2015 1025   WBC 7.4 10/31/2013 0948   RBC 2.80* 03/13/2015 0657   RBC 5.04 02/25/2015 1025   RBC 4.79 10/31/2013 0948   HGB 7.8* 03/13/2015 0657   HGB 14.4 02/25/2015 1025   HGB 14.5 10/31/2013 0948   HCT 24.8* 03/13/2015 0657   HCT 42.7 02/25/2015 1025   HCT 46.0 10/31/2013 0948   PLT 156 03/13/2015 0657   PLT 142* 02/25/2015 1025   MCV 88.6 03/13/2015 0657   MCV 85 02/25/2015 1025   MCV 96.0 10/31/2013 0948   MCH 27.9 03/13/2015 0657   MCH 28.6 02/25/2015 1025   MCH 30.3 10/31/2013 0948   MCHC 31.5 03/13/2015 0657   MCHC 33.7 02/25/2015 1025   MCHC 31.5* 10/31/2013 0948   RDW 18.7* 03/13/2015 0657   RDW 18.7* 02/25/2015 1025   LYMPHSABS 1.1 02/25/2015 1025   LYMPHSABS 2.9 05/03/2009 0102   MONOABS 0.6 05/03/2009 0102   EOSABS 0.2 02/25/2015 1025   EOSABS 0.1 05/03/2009 0102   BASOSABS 0.0 02/25/2015 1025   BASOSABS 0.1 05/03/2009 0102    CXR: 5/20 elevated R hemidiaphragm with compressive atx, minimal LLL atx   IMPRESSION: Metastatic breast ca Recent sinus surgery Hypoxic respiratory failure 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 Has completed 7 days abx for suspected PNA   PLAN/REC: DC abx Cont nebulized albuterol Add nebulized budesonide Cont chest percussion vest for as long as pt believes it is beneficial Cont O2 therapy Her resp status should be optimal in upright position We discussed goals of care and importance of clarifying priorities  I indicated that I felt intubation and mech vent are probably not appropriate for her @ this stage of her illness   She and daughter seemed to agree on this  I expressed importance of addressing pain (She's trying to "live with it") Palliative Care Consultation has been requested I would think that home with Hospice would be the most important goal BiPAP can conceivably be used as a palliative measure to  relieve dyspnea as long as it relieves more discomfort than it creates  Would need to clear with ENT if/when it would be OK to use  PCCM will see again Mon 5/23. Please call if needed prior to then  Merton Border, MD ; [facility] [phone].  After 5:30 PM or weekends, call [phone]

## 2015-03-14 DIAGNOSIS — Z7189 Other specified counseling: Secondary | ICD-10-CM

## 2015-03-14 DIAGNOSIS — R630 Anorexia: Secondary | ICD-10-CM | POA: Insufficient documentation

## 2015-03-14 DIAGNOSIS — R0689 Other abnormalities of breathing: Secondary | ICD-10-CM

## 2015-03-14 DIAGNOSIS — R0600 Dyspnea, unspecified: Secondary | ICD-10-CM

## 2015-03-14 DIAGNOSIS — G894 Chronic pain syndrome: Secondary | ICD-10-CM | POA: Insufficient documentation

## 2015-03-14 LAB — BASIC METABOLIC PANEL
ANION GAP: 5 (ref 5–15)
Anion gap: 6 (ref 5–15)
BUN: 12 mg/dL (ref 6–20)
BUN: 10 mg/dL (ref 6–20)
CHLORIDE: 98 mmol/L — AB (ref 101–111)
CO2: 36 mmol/L — AB (ref 22–32)
CO2: 34 mmol/L — AB (ref 22–32)
CREATININE: 0.83 mg/dL (ref 0.44–1.00)
Calcium: 8.7 mg/dL — ABNORMAL LOW (ref 8.9–10.3)
Calcium: 9.1 mg/dL (ref 8.9–10.3)
Chloride: 99 mmol/L — ABNORMAL LOW (ref 101–111)
Creatinine, Ser: 0.91 mg/dL (ref 0.44–1.00)
GFR calc Af Amer: >60 mL/min (ref >60)
GFR calc non Af Amer: >60 mL/min (ref >60)
GFR calc non Af Amer: >60 mL/min (ref >60)
GLUCOSE: 84 mg/dL (ref 65–99)
Glucose, Bld: 100 mg/dL — ABNORMAL HIGH (ref 65–99)
Potassium: 3.7 mmol/L (ref 3.5–5.1)
Potassium: 3.6 mmol/L (ref 3.5–5.1)
SODIUM: 139 mmol/L (ref 135–145)
Sodium: 139 mmol/L (ref 135–145)

## 2015-03-14 LAB — CBC
HEMATOCRIT: 24.7 % — AB (ref 36.0–46.0)
Hemoglobin: 7.7 g/dL — ABNORMAL LOW (ref 12.0–15.0)
MCH: 27.5 pg (ref 26.0–34.0)
MCHC: 31.2 g/dL (ref 30.0–36.0)
MCV: 88.2 fL (ref 78.0–100.0)
Platelets: 183 10*3/uL (ref 150–400)
RBC: 2.8 MIL/uL — ABNORMAL LOW (ref 3.87–5.11)
RDW: 18.9 % — AB (ref 11.5–15.5)
WBC: 5.7 10*3/uL (ref 4.0–10.5)

## 2015-03-14 LAB — MAGNESIUM: MAGNESIUM: 2.3 mg/dL (ref 1.7–2.4)

## 2015-03-14 MED ORDER — FUROSEMIDE 10 MG/ML IJ SOLN
20.0000 mg | Freq: Two times a day (BID) | INTRAMUSCULAR | Status: AC
  Administered 2015-03-14 (×2): 20 mg via INTRAVENOUS
  Filled 2015-03-14 (×2): qty 2

## 2015-03-14 NOTE — Consult Note (Signed)
Spoke to pt's mother and sister [name].  Equipment ordered through [facility] to be delivered today to prevent further delay with pt being discharged tomorrow. Ordered hospital bed with gel overlay, OBT, 3n1, oxygen, nebulizer, rolling walker and wheelchair. [facility] will contact Robin to discuss delivery time per family request. Will continue to follow. Will get orders for services from Dr. Antonieta Pert office first thing in am and will follow pt at home when discharged from hospital. Thank you for this referral. Please notify me for any other concerns. Plan to follow up tomorrow in hospital prior to be discharged. Webb Silversmith RN HOP [phone].

## 2015-03-14 NOTE — Consult Note (Signed)
Patient Bianca Kent      DOB: 1966-09-10      CWU:[id number]     Consult Note from the Palliative Medicine Team at [facility] Requested by: Dr Bianca Kent     PCP: Bianca Napoleon, MD Reason for Consultation: [place]     Phone Number:[phone]  Assessment/Recommendations: 49 yo female with metastatic breast ca admitted for elective sinus surgery complicated by post-op hypoxemia which has persisted in setting of extensive consolidation on chest CT and paralyzed right hemidiaphragm.    1.  Code Status: partial  2. GOC: Able to complete formal evaluation with Bianca Kent today and her family.  Some confusion on family/patient part with perceived differences in medical opinions regarding her ability to overcome acute illness or not.  She was certainly not expecting to be in this situation after elective surgery and even mentions that she "feels cheated" that her time may be shorter than originally expected. Independence clearly important to her and if felt she is unlikely to recover from this process, she wants to be home with her Bianca Kent.  We talked through some of the differences in medical opinions as well (espcially with her mom).  I think from a cancer perspective, it seems apparent from talking with Dr [name] that her cancer has responded well to treatment and no definitive evidence that her cancer has changed or rapidly progressed. She also has triple positive disease which portends to better prognosis and more treatment options. The balance in this comes with how she has been doing over past 11 days here in hospital after completing course of abx, diuresis and her paralyzed hemidiaphragm/atrelectasis/hypoxia. Certainly concerning that she has not made more significant improvement and no easily identifiable interventions to help reverse this process in discussing care with inpatient providers.  Bianca Kent and family have felt this lack of improvement as well.  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  Ultimately, I think a reasonable avenue to pursue is see how she does over next few days and see if this 1. Leads to clinical improvement and 2. Influences Bianca Kent's desire to pursue home hospice care. At this time, from discussions they have had with multiple providers, it seems Bianca Kent wishes to pursue home hospice care at time of discharge.  Hospice liaison to meet with them today.  I think if she does go home with hospice care and does have improvement, she could "graduate" from hospice and resume oncologic interventions should she desire.  I will be off service tomorrow, but feel free to contact our team should additional questions concerns arise.    3. Symptom Management:   1. Chronic Pain: Can continue home regimen. Discussed her concerns about opioids and their safe use. May want to consider d/c'ing tramadol at d/c and only using 1 PRN. I think oxycodone may be better choice as it seems to help her dyspnea more as well.   2. Dyspnea: Continue PRN oxycodone. Also on fentanyl patch at 2mcg/hr. Not needed PRN IV dilaudid in several days.  3. Appetite Loss- already on zyprexa, steroids, marinol. Doubt additional agents beneficial.      4. Psychosocial/Spiritual: Lives at home with her mom. Spiritual care to see. 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  No children. Bianca Kent named Bianca Kent at home.     Brief HPI: 49 yo female with PMHx  of metastatic breast CA (ER/PR/Her-2 +) and brain mets s/p XRT admitted 11 days ago for elective outpatient sinus surgery complicated by post-operative hypoxemia.  Hypoxemia worsened during hospital stay with associated fevers and presumptive PNA. She has completed course of abx with continued tenuous respiratory status and identification of paralyzed right hemidiaphragm and atelectasis.  She has also been diuresed over past several  days.  Discussions between pulm/ccm and primary team with patient given concerns about her respiratory status have lead to discussions about hospice. I was only able to very briefly meet patient yesterday but able to complete full consultation today.    In speaking with Bianca Kent and her mother this morning, they feel like she has really struggled to make any progress over the past 11 days.  Bianca Kent is shocked to be in this state because she thought she had "more time".  They have discussed issues with other providers, but were surprised to hear Dr [name] have concerns that she could potentially improve from here.  Bianca Kent plans on speaking with hospice today as she desires to get back home and be with her dog.  She has some chronic low back pain which is exacerbated by being in bed so much here. Pain meds help some. She has noted fairly good response to her dyspnea from PRN opioids.  Some trouble sleeping at night but past few nights have been better. Occasional visual hallucinations which have improved some.  No other acute complaints at this time. Appetite continues to be poor          PMH:  Past Medical History  Diagnosis Date  . Allergy   . Asthma   . Coronary artery spasm   . Sleep apnea     Does not use machine patient stated "its not bad enough for that"  . Pneumonia     hx of  . UTI (lower urinary tract infection)     Due to small ureters  . Hypertension     pt reports that while taking medication for coronary spasms caused elevated blood pressure. Pt no longer takes meds for coronary spasms or HTN.  . Radiation 08/06/14-09/16/14    right breast, axillary, supraclavicular region   . Cancer     right breast & lymph nodes  . Metastasis from breast cancer 11/26/14    MRI Brain  . Breast cancer 02/2014    right  . History of radiation therapy 12/16/14    Left temporal tumor, SRS treatment  . PONV (postoperative nausea and vomiting)   . Family history of anesthesia complication     Mother  and sisters has angioedema  . Shortness of breath dyspnea     "side effect of Chemo"  . GERD (gastroesophageal reflux disease)   . Arthritis     Osteo arthritis     PSH: Past Surgical History  Procedure Laterality Date  . Cholecystectomy    . Knee surgery Right     arthroscopy- cleaned menicus  . Tendon repair Left     index finger  . Tonsillectomy    . Sinus surgery with instatrak      without instatrak  . Tubal ligation    . Colonoscopy w/ polypectomy  2008  . Wisdom tooth extraction    . Cardiac catheterization  X 3    no PCI  . Portacath placement Left 03/26/2014    Procedure: INSERTION PORT-A-CATH;  Surgeon: Rolm Bookbinder, MD;  Location: [facility];  Service: General;  Laterality: Left;  . Breast  biopsy with sentinel lymph node biopsy and needle localization    . Sinusotomy  03/03/2015  . Sinus endo w/fusion Bilateral 03/03/2015    Procedure: ENDOSCOPIC SINUS SURGERY WITH NAVIGATION;  Surgeon: Melvenia Beam, MD;  Location: [facility];  Service: ENT;  Laterality: Bilateral;   I have reviewed the FH and SH and  If appropriate update it with new information. Allergies  Allergen Reactions  . Iohexol      Code: RASH, Desc: VERY STRONG FAMILY HX OF ANGIOEDEMA WHEN RECEIVING IV CONTRAST; PT HAS BEEN PREMEDICATED FOR OTHER CONTRASTED STUDIES(IN CATH. LAB)  KR, Onset Date: [id number]   . Prednisone Itching    Capillary beds bust  . Tetanus Toxoids Other (See Comments)    Ran a high fever for 48 hours  . Theophyllines Hives    Mental changes  . Versed [Midazolam] Other (See Comments)    Pt becomes violent   Scheduled Meds: . acidophilus  2 capsule Oral BID  . albuterol  2.5 mg Nebulization 3 times per day  . antiseptic oral rinse  7 mL Mouth Rinse BID  . budesonide  0.25 mg Nebulization 3 times per day  . calcium carbonate  2 tablet Oral BID  . dextromethorphan-guaiFENesin  1 tablet Oral BID  . dronabinol  10 mg Oral TID AC  . enoxaparin (LOVENOX) injection  40 mg Subcutaneous  Q24H  . feeding supplement (ENSURE ENLIVE)  237 mL Oral TID BM  . fentaNYL  50 mcg Transdermal Q72H  . fluconazole (DIFLUCAN) IV  200 mg Intravenous Q24H  . lactobacillus  1 g Oral TID WC  . lidocaine  15 mL Mouth/Throat Q4H  . methylphenidate  10 mg Oral BID  . multivitamin with minerals  1 tablet Oral Daily  . OLANZapine  5 mg Oral QHS  . pantoprazole  40 mg Oral Daily  . polyethylene glycol  17 g Oral Daily  . senna-docusate  2 tablet Oral BID  . sodium chloride  10-40 mL Intracatheter Q12H  . Vitamin D (Ergocalciferol)  50,000 Units Oral Q Thu   Continuous Infusions:  PRN Meds:.acetaminophen, albuterol, ALPRAZolam, bismuth subsalicylate, diphenhydrAMINE, docusate sodium, docusate sodium, HYDROmorphone (DILAUDID) injection, ibuprofen, metoCLOPramide, ondansetron (ZOFRAN) IV, oxyCODONE, phenol, sodium chloride, traMADol, traZODone    BP 100/56 mmHg  Pulse 88  Temp(Src) 98 F (36.7 C) (Axillary)  Resp 23  Ht 5\' 7"  (1.702 m)  Wt 81.5 kg (179 lb 10.8 oz)  BMI 28.13 kg/m2  SpO2 84%  LMP 04/22/2014   PPS: 40   Intake/Output Summary (Last 24 hours) at 03/14/15 0951 Last data filed at 03/14/15 0921  Gross per 24 hour  Intake    550 ml  Output   1750 ml  Net  -1200 ml    Physical Exam:  General: Alert, NAD HEENT:  Thermal, sclera anicteric CVS:   Regular rate Abdomen:ND Ext: warm MSK: no focal midline low back tenderness  Labs: CBC    Component Value Date/Time   WBC 5.7 03/14/2015 0600   WBC 5.7 02/25/2015 1025   WBC 7.4 10/31/2013 0948   RBC 2.80* 03/14/2015 0600   RBC 5.04 02/25/2015 1025   RBC 4.79 10/31/2013 0948   HGB 7.7* 03/14/2015 0600   HGB 14.4 02/25/2015 1025   HGB 14.5 10/31/2013 0948   HCT 24.7* 03/14/2015 0600   HCT 42.7 02/25/2015 1025   HCT 46.0 10/31/2013 0948   PLT 183 03/14/2015 0600   PLT 142* 02/25/2015 1025   MCV 88.2 03/14/2015 0600  MCV 85 02/25/2015 1025   MCV 96.0 10/31/2013 0948   MCH 27.5 03/14/2015 0600   MCH 28.6 02/25/2015  1025   MCH 30.3 10/31/2013 0948   MCHC 31.2 03/14/2015 0600   MCHC 33.7 02/25/2015 1025   MCHC 31.5* 10/31/2013 0948   RDW 18.9* 03/14/2015 0600   RDW 18.7* 02/25/2015 1025   LYMPHSABS 1.1 02/25/2015 1025   LYMPHSABS 2.9 05/03/2009 0102   MONOABS 0.6 05/03/2009 0102   EOSABS 0.2 02/25/2015 1025   EOSABS 0.1 05/03/2009 0102   BASOSABS 0.0 02/25/2015 1025   BASOSABS 0.1 05/03/2009 0102    BMET    Component Value Date/Time   NA 139 03/14/2015 0600   NA 143 02/25/2015 1027   K 3.7 03/14/2015 0600   K 4.2 02/25/2015 1027   CL 98* 03/14/2015 0600   CL 104 02/25/2015 1027   CO2 36* 03/14/2015 0600   CO2 28 02/25/2015 1027   GLUCOSE 84 03/14/2015 0600   GLUCOSE 81 02/25/2015 1027   BUN 12 03/14/2015 0600   BUN 8 02/25/2015 1027   CREATININE 0.91 03/14/2015 0600   CREATININE 0.8 02/25/2015 1027   CALCIUM 8.7* 03/14/2015 0600   CALCIUM 9.4 02/25/2015 1027   GFRNONAA >60 03/14/2015 0600   GFRAA >60 03/14/2015 0600    CMP     Component Value Date/Time   NA 139 03/14/2015 0600   NA 143 02/25/2015 1027   K 3.7 03/14/2015 0600   K 4.2 02/25/2015 1027   CL 98* 03/14/2015 0600   CL 104 02/25/2015 1027   CO2 36* 03/14/2015 0600   CO2 28 02/25/2015 1027   GLUCOSE 84 03/14/2015 0600   GLUCOSE 81 02/25/2015 1027   BUN 12 03/14/2015 0600   BUN 8 02/25/2015 1027   CREATININE 0.91 03/14/2015 0600   CREATININE 0.8 02/25/2015 1027   CALCIUM 8.7* 03/14/2015 0600   CALCIUM 9.4 02/25/2015 1027   PROT 6.3* 03/06/2015 0425   PROT 8.7* 02/25/2015 1027   ALBUMIN 2.5* 03/06/2015 0425   AST 41 03/06/2015 0425   AST 45* 02/25/2015 1027   ALT 38 03/06/2015 0425   ALT 43 02/25/2015 1027   ALKPHOS 238* 03/06/2015 0425   ALKPHOS 189* 02/25/2015 1027   BILITOT 0.4 03/06/2015 0425   BILITOT 0.70 02/25/2015 1027   GFRNONAA >60 03/14/2015 0600   GFRAA >60 03/14/2015 0600    5/20 CTA Chest IMPRESSION: 1. No definite evidence for pulmonary embolism. Evaluation of the distal segmental  and subsegmental pulmonary arteries limited due to motion artifact. 2. Interval development of extensive pulmonary consolidation throughout left lung particularly involving the left lower lobe. Findings are nonspecific however may be secondary to an infectious or inflammatory process. Additionally there are new enlarged left hilar lymph nodes which are nonspecific however potentially secondary to the infectious or inflammatory process. Continued radiographic follow-up is recommended to ensure resolution. 3. Suggestion of increased opacification within the right upper lobe which may be secondary to radiation pneumonitis or potentially an infectious/inflammatory process. Metastatic disease not excluded. Recommend attention on followup. 4. Unchanged collapse/consolidation within the right middle and lower lobes with right hemidiaphragm elevation.  5/20 CXR/Sniff test IMPRESSION: Findings consistent with paralysis of the right hemi diaphragm.   Total Time: 90 minutes Greater than 50%  of this time was spent counseling and coordinating care related to the above assessment and plan. Extensive case discussion with Dr [name], Dr Bianca Kent, Dr Renne Crigler today.   Doran Clay D.O. Palliative Medicine Team at [facility]  Pager:  [phone] Team Phone: [phone]

## 2015-03-14 NOTE — Progress Notes (Signed)
Obtained an order to take pt outside for a brief period of time. Once patient was ready I took her outside to the [facility] entrance to sit outside with her family. Pt remained on the monitor and was on her O2. VSS throughout the trip. Pt back in her room resting. Will continue to assess and monitor.

## 2015-03-14 NOTE — Care Management Note (Signed)
Case Management Note  Patient Details  Name: Bianca Kent MRN: [id number] Date of Birth: July 28, 1966  Subjective/Objective:                  CA  Action/Plan: Discharge planning  Expected Discharge Date:                  Expected Discharge Plan:  Home w Hospice Care  In-House Referral:     Discharge planning Services  CM Consult  Post Acute Care Choice:  Hospice Choice offered to:  Patient  DME Arranged:  3-N-1, Hospital bed, Oxygen, Overbed table, Walker rolling DME Agency:     HH Arranged:    [place]  Status of Service:  Completed, signed off  Medicare Important Message Given:    Date Medicare IM Given:    Medicare IM give by:    Date Additional Medicare IM Given:    Additional Medicare Important Message give by:     If discussed at [place] of Stay Meetings, dates discussed:    Additional Comments: CM met with pt and mother, Bianca Kent [phone] and pt defers all decision making to 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  Bianca Kent chooses [facility] ([facility]) to render services.  Palliative Care MD states he will need to make sure all MDs are on-board prior to final decision but family feels strongly about going home with hospice.  CM called [facility] and spoke with Bianca Kent and gave Bianca Kent a tentative list of DMe including O2, bed, 3n1, rolling walker and bed tray.  Bianca Kent and pt agree they can make it home with O2 and in [name]'s car (i.e. No ambulance transport necessary). Bianca Kent states [facility] can call her and leave message.  Bianca Kent has planned for where DME needs to be placed and will be available for delivery.  No other CM needs were communicated. Dellie Catholic, RN 03/14/2015, 10:38 AM

## 2015-03-14 NOTE — Progress Notes (Addendum)
PROGRESS NOTE  Bianca Kent XTK:[id number] DOB: Jan 30, 1966 DOA: 03/03/2015 PCP: [name], MD  HPI: 49 year old female with a history of metastatic breast cancer to the brain (Dr. [name]), asthma, depression, GERD, was admitted on 03/03/2015 for elective outpatient sinus surgery. The patient underwent a left frontal ethmoid mucocele drainage performed by Dr. [name]. During the surgery, the patient also had her anterior Reform CSF leak from the mucocele repaired with cautery. Postoperatively, the patient has some hypoxemia, and therefore she was kept overnight. On postoperative day #2, the patient's oxygen saturations have somewhat improved and the patient was about to be discharged home. Unfortunately, the patient began developing fever and subsequently was found to have hypoxemia with oxygen saturations in the mid 70s on room air.   Subjective / 24 H Interval events - feeling better today, continues to be hypoxic overnight while asleep but bounces back up when she is awake - no chest pain, abdominal pain, nausea or vomiting  Assessment/Plan: Active Problems:   Post-operative state   Acute respiratory failure with hypoxia   Postprocedural fever   Breast cancer, stage 4   Thrombocytopenia   Confusion   Hypoxemia   HCAP (healthcare-associated pneumonia)   Acute respiratory failure   PNA (pneumonia)   Acute hypoxic and hypercarbic respiratory failure - ongoing, requires 100% FIO2 via mask - CT scan 5/20 showed right middle/lower lobes collapse, bilateral opacifications. 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 No PE - has recently received chemotherapy - appreciate PCCM consult - her respiratory status continues to remain very tenuous, closely monitor for decompensation - finished Vancomycin / Zosyn for presumed PNA 5/13 >> 5/21 for 9 days  Edema - 166 lbs on admission 5/11 >> 5/19 186 lbs >> 5/20 178 lbs >> 179 5/22, ?accuracy since clinically she looks with  less edema - continue IV Lasix, repeat dose today x 2 - continue daily weights - no evidence of heart failure on 2D echo  Goals of care - discussed this morning with patient and patient's mother - patient would not want intubation but is not ready to be full DNR. She is to think today about CPR and will discuss later on with Palliative if she changes her mind.  - discussed with Dr. Deitra Mayo with Palliative over the phone this morning  Hypokalemia - due to Lasix, monitor  Oral thrush and odynophagia,  - continue iv diflucan, acyclovir stopped 5/19 (started by oncologist Dr. [name]) - improving  Thrombocytopenia / Anemia - from chemo vs infection vs consumption.  - platelets now normal, Hb stable  Metastatic breast cancer with brain mets - Reported has not been getting full dose chemo due to recent illness, followed by Dr. [name]. - discussed about her care with Dr. [name] 5/21  S/p bilateral sinus surgery and Draf III frontal sinusotomy 03/03/2015. - Per ENT  Constipation:  - stool softener   Diet: Diet regular Room service appropriate?: Yes; Fluid consistency:: Thin Fluids: none  DVT Prophylaxis: lovenox on 5/15 stopped due to worsening of thrombocytopenia. Resumed 5/18   Code Status: Partial Code Family Communication: d/w mother bedside  Disposition Plan: remain in SDU  Consultants:  PCCM  Palliative  Procedures:  bilateral sinus surgery and Draf III frontal sinusotomy 03/03/2015   Antibiotics Vancomycin 5/13 >> 5/21 Zosyn 5/13 >> 5/21   Studies  Ct Angio Chest Pe W/cm &/or Wo Cm 03/12/2015 1. No definite evidence for pulmonary embolism. Evaluation of the distal segmental and subsegmental pulmonary arteries limited due to motion artifact.  2. Interval development of extensive pulmonary consolidation throughout left lung particularly involving the left lower lobe. Findings are nonspecific however may be secondary to an infectious or inflammatory process.  Additionally there are new enlarged left hilar lymph nodes which are nonspecific however potentially secondary to the infectious or inflammatory process. Continued radiographic follow-up is recommended to ensure resolution. 3. Suggestion of increased opacification within the right upper lobe which may be secondary to radiation pneumonitis or potentially an infectious/inflammatory process. Metastatic disease not excluded. Recommend attention on followup. 4. Unchanged collapse/consolidation within the right middle and lower lobes with right hemidiaphragm elevation.     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     Objective  Filed Vitals:   03/14/15 0200 03/14/15 0400 03/14/15 0600 03/14/15 0729  BP: 94/50 86/49 104/62 100/56  Pulse:    88  Temp:  98.3 F (36.8 C)  98 F (36.7 C)  TempSrc:  Oral  Axillary  Resp: 19 20 16 23   Height:      Weight:  81.5 kg (179 lb 10.8 oz)    SpO2: 92% 92% 94% 84%    Intake/Output Summary (Last 24 hours) at 03/14/15 0837 Last data filed at 03/13/15 2223  Gross per 24 hour  Intake    670 ml  Output   1200 ml  Net   -530 ml   Filed Weights   03/12/15 0539 03/12/15 1200 03/14/15 0400  Weight: 84.9 kg (187 lb 2.7 oz) 81 kg (178 lb 9.2 oz) 81.5 kg (179 lb 10.8 oz)   Exam:  General:  NAD, looks more comfortable this morning  HEENT: no scleral icterus, PERRL  Cardiovascular: RRR without MRG, 2+ peripheral pulses, 1+ pitting LE edema  Respiratory: shallow breathing, no wheezing, decreased sounds at the bases  Abdomen: soft, non tender, BS +, no guarding  MSK/Extremities: no clubbing/cyanosis, no joint swelling  Skin: no rashes  Neuro: non focal  Data Reviewed: Basic Metabolic Panel:  Recent Labs Lab 03/10/15 0412 03/11/15 0542 03/12/15 0342 03/13/15 0657 03/14/15 0600  NA 141 140 139 142 139  K 3.0* 3.1* 4.1 3.9 3.7  CL 102 100* 96* 99* 98*  CO2 32 33* 32 36* 36*  GLUCOSE 102* 117* 85  78 84  BUN 5* 7 9 7 12   CREATININE 0.93 0.88 0.91 0.85 0.91  CALCIUM 8.2* 8.0* 8.7* 8.9 8.7*  MG 2.1 2.3 2.1 2.5* 2.3  PHOS  --  4.4 5.1*  --   --    CBC:  Recent Labs Lab 03/10/15 0412 03/11/15 0542 03/12/15 0342 03/13/15 0657 03/14/15 0600  WBC 5.6 5.4 7.2 6.0 5.7  HGB 8.3* 8.1* 8.8* 7.8* 7.7*  HCT 25.5* 24.5* 27.6* 24.8* 24.7*  MCV 86.7 86.3 86.8 88.6 88.2  PLT 124* 135* 167 156 183   BNP (last 3 results)  Recent Labs  03/05/15 1851  BNP 48.8    Recent Results (from the past 240 hour(s))  Culture, blood (routine x 2)     Status: None   Collection Time: 03/05/15  3:00 PM  Result Value Ref Range Status   Specimen Description BLOOD LEFT HAND  Final   Special Requests BOTTLES DRAWN AEROBIC AND ANAEROBIC 10CC  Final   Culture   Final    NO GROWTH 5 DAYS Performed at [facility]    Report Status 03/12/2015 FINAL  Final  Culture, blood (routine x 2)     Status: None   Collection Time: 03/05/15  3:10 PM  Result Value Ref Range Status   Specimen Description BLOOD RIGHT HAND  Final   Special Requests BOTTLES DRAWN AEROBIC AND ANAEROBIC 2CC  Final   Culture   Final    NO GROWTH 5 DAYS Note: Culture results may be compromised due to an inadequate volume of blood received in culture bottles. Performed at [facility]    Report Status 03/12/2015 FINAL  Final  Urine culture     Status: None   Collection Time: 03/05/15  7:00 PM  Result Value Ref Range Status   Specimen Description URINE, CLEAN CATCH  Final   Special Requests NONE  Final   Colony Count NO GROWTH Performed at [facility]   Final   Culture NO GROWTH Performed at [facility]   Final   Report Status 03/06/2015 FINAL  Final     Scheduled Meds: . acidophilus  2 capsule Oral BID  . albuterol  2.5 mg Nebulization 3 times per day  . antiseptic oral rinse  7 mL Mouth Rinse BID  . budesonide  0.25 mg Nebulization 3 times per day  . calcium carbonate  2 tablet Oral BID    . dextromethorphan-guaiFENesin  1 tablet Oral BID  . dronabinol  10 mg Oral TID AC  . enoxaparin (LOVENOX) injection  40 mg Subcutaneous Q24H  . feeding supplement (ENSURE ENLIVE)  237 mL Oral TID BM  . fentaNYL  50 mcg Transdermal Q72H  . fluconazole (DIFLUCAN) IV  200 mg Intravenous Q24H  . lactobacillus  1 g Oral TID WC  . lidocaine  15 mL Mouth/Throat Q4H  . methylphenidate  10 mg Oral BID  . multivitamin with minerals  1 tablet Oral Daily  . OLANZapine  5 mg Oral QHS  . pantoprazole  40 mg Oral Daily  . polyethylene glycol  17 g Oral Daily  . senna-docusate  2 tablet Oral BID  . sodium chloride  10-40 mL Intracatheter Q12H  . Vitamin D (Ergocalciferol)  50,000 Units Oral Q Thu   Continuous Infusions:   Time spent: 35 minutes  Marzetta Board, MD [facility] Pager [phone]. If 7 PM - 7 AM, please contact night-coverage at [email or web address], password [facility] 03/14/2015, 8:37 AM  LOS: 11 days

## 2015-03-14 NOTE — Progress Notes (Signed)
Pt & family in a meeting with palliative care. No CPT done at this time. Will check back later for CPT.

## 2015-03-15 ENCOUNTER — Inpatient Hospital Stay (HOSPITAL_COMMUNITY): Payer: 59

## 2015-03-15 ENCOUNTER — Ambulatory Visit: Payer: 59 | Admitting: Physical Therapy

## 2015-03-15 DIAGNOSIS — G5680 Other specified mononeuropathies of unspecified upper limb: Secondary | ICD-10-CM | POA: Insufficient documentation

## 2015-03-15 DIAGNOSIS — G588 Other specified mononeuropathies: Secondary | ICD-10-CM | POA: Insufficient documentation

## 2015-03-15 DIAGNOSIS — Z9889 Other specified postprocedural states: Secondary | ICD-10-CM

## 2015-03-15 DIAGNOSIS — G5681 Other specified mononeuropathies of right upper limb: Secondary | ICD-10-CM

## 2015-03-15 DIAGNOSIS — Z170 Estrogen receptor positive status [ER+]: Secondary | ICD-10-CM

## 2015-03-15 LAB — CBC
HCT: 25.3 % — ABNORMAL LOW (ref 36.0–46.0)
Hemoglobin: 7.9 g/dL — ABNORMAL LOW (ref 12.0–15.0)
MCH: 27.3 pg (ref 26.0–34.0)
MCHC: 31.2 g/dL (ref 30.0–36.0)
MCV: 87.5 fL (ref 78.0–100.0)
PLATELETS: 189 10*3/uL (ref 150–400)
RBC: 2.89 MIL/uL — ABNORMAL LOW (ref 3.87–5.11)
RDW: 18.9 % — ABNORMAL HIGH (ref 11.5–15.5)
WBC: 6 10*3/uL (ref 4.0–10.5)

## 2015-03-15 LAB — PREPARE RBC (CROSSMATCH)

## 2015-03-15 LAB — MAGNESIUM: Magnesium: 2.2 mg/dL (ref 1.7–2.4)

## 2015-03-15 LAB — ABO/RH: ABO/RH(D): O POS

## 2015-03-15 MED ORDER — BUDESONIDE 0.25 MG/2ML IN SUSP
0.5000 mg | Freq: Two times a day (BID) | RESPIRATORY_TRACT | Status: DC
  Administered 2015-03-15 – 2015-03-17 (×5): 0.5 mg via RESPIRATORY_TRACT
  Filled 2015-03-15 (×9): qty 4

## 2015-03-15 MED ORDER — GUAIFENESIN ER 600 MG PO TB12
600.0000 mg | ORAL_TABLET | Freq: Once | ORAL | Status: AC
  Administered 2015-03-15: 600 mg via ORAL
  Filled 2015-03-15: qty 1

## 2015-03-15 MED ORDER — BUDESONIDE 0.25 MG/2ML IN SUSP
0.2500 mg | Freq: Three times a day (TID) | RESPIRATORY_TRACT | Status: DC
  Administered 2015-03-15: 0.25 mg via RESPIRATORY_TRACT
  Filled 2015-03-15 (×4): qty 2

## 2015-03-15 MED ORDER — FUROSEMIDE 10 MG/ML IJ SOLN
20.0000 mg | Freq: Once | INTRAMUSCULAR | Status: AC
  Administered 2015-03-15: 20 mg via INTRAVENOUS
  Filled 2015-03-15: qty 2

## 2015-03-15 MED ORDER — ALBUTEROL SULFATE (2.5 MG/3ML) 0.083% IN NEBU
2.5000 mg | INHALATION_SOLUTION | Freq: Four times a day (QID) | RESPIRATORY_TRACT | Status: DC
  Administered 2015-03-15 – 2015-03-22 (×26): 2.5 mg via RESPIRATORY_TRACT
  Filled 2015-03-15 (×28): qty 3

## 2015-03-15 MED ORDER — ALBUTEROL SULFATE (2.5 MG/3ML) 0.083% IN NEBU
2.5000 mg | INHALATION_SOLUTION | Freq: Three times a day (TID) | RESPIRATORY_TRACT | Status: DC
  Administered 2015-03-15: 2.5 mg via RESPIRATORY_TRACT
  Filled 2015-03-15: qty 3

## 2015-03-15 MED ORDER — BUDESONIDE 0.25 MG/2ML IN SUSP: 0.5000 mg | Freq: Three times a day (TID) | RESPIRATORY_TRACT | Status: DC

## 2015-03-15 MED ORDER — GUAIFENESIN ER 600 MG PO TB12
1200.0000 mg | ORAL_TABLET | Freq: Two times a day (BID) | ORAL | Status: DC
  Administered 2015-03-15 – 2015-03-22 (×14): 1200 mg via ORAL
  Filled 2015-03-15 (×15): qty 2

## 2015-03-15 MED ORDER — SODIUM CHLORIDE 0.9 % IV SOLN
Freq: Once | INTRAVENOUS | Status: AC
  Administered 2015-03-15: 12:00:00 via INTRAVENOUS

## 2015-03-15 MED ORDER — GUAIFENESIN ER 600 MG PO TB12
1200.0000 mg | ORAL_TABLET | Freq: Two times a day (BID) | ORAL | Status: DC
  Filled 2015-03-15 (×2): qty 2

## 2015-03-15 NOTE — Progress Notes (Signed)
Name: Bianca Kent MRN: [id number] DOB: [birth year]    ADMISSION DATE:  03/03/2015 CONSULTATION DATE:  5/14  REFERRING MD :  Erlinda Hong  CHIEF COMPLAINT: hypoxia/SIRS (possible sepsis)  BRIEF PATIENT DESCRIPTION:  49 year old female w/ sig history of metastatic breast cancer w/ mets to brain (followed by [facility]). Last partial dose chemo 5/5, last xrt to left temporal area Feb 2016. Had remote h/o sinus surgery from which she had CSF leak w/ prior repair. 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 Surgery was uneventful but she was kept in the hospital over night as she had post-op sats in low 90s and needed pain management. Developed fever and had asymptomatic hypoxia w/ room air sats 70s on 5/13. Given IVFs, ABX widened. PCCM asked to see the following day for hypoxia and concern for sepsis. Found to have ATX thought due to suspected OSA, nasal packing, and possibly pain medications.   SIGNIFICANT EVENTS  5/11: bilateral sinus surgery and Draf III sinusotomy w/ removal of fronto-ethmoid mucocele  STUDIES:  Upper extremity doppler 5/15> No evidence upper extremity DVT Echo 8/88 >> nml LV systolic fxn, EF 28-00%, no RWMA, PA wnl  SUBJECTIVE: Feels about the same, no distress   VITAL SIGNS: Temp:  [97.7 F (36.5 C)-98.8 F (37.1 C)] 97.7 F (36.5 C) (05/23 0745) Pulse Rate:  [77-94] 77 (05/23 0342) Resp:  [16-24] 20 (05/23 0745) BP: (85-121)/(52-65) 95/55 mmHg (05/23 0745) SpO2:  [89 %-100 %] 95 % (05/23 0939) FiO2 (%):  [60 %] 60 % (05/23 0939) Weight:  [80.5 kg (177 lb 7.5 oz)] 80.5 kg (177 lb 7.5 oz) (05/23 0410)  PHYSICAL EXAMINATION: General:  Comfortable in bed HENT: NCAT OP clear  PULM diminished R base, crackles left base CV: RRR, no mgr GI: BS+, soft, nontender MSK: normal bulk and tone Neuro: A&Ox4, maew     Recent Labs Lab 03/13/15 0657 03/14/15 0600  03/14/15 1250  NA 142 139 139  K 3.9 3.7 3.6  CL 99* 98* 99*  CO2 36* 36* 34*  BUN 7 12 10   CREATININE 0.85 0.91 0.83  GLUCOSE 78 84 100*    Recent Labs Lab 03/13/15 0657 03/14/15 0600 03/15/15 0415  HGB 7.8* 7.7* 7.9*  HCT 24.8* 24.7* 25.3*  WBC 6.0 5.7 6.0  PLT 156 183 189   ABG    Component Value Date/Time   PHART 7.421 03/12/2015 1658   PCO2ART 54.2* 03/12/2015 1658   PO2ART 71.0* 03/12/2015 1658   HCO3 35.3* 03/12/2015 1658   TCO2 37 03/12/2015 1658   ACIDBASEDEF 1.0 08/11/2007 2259   O2SAT 94.0 03/12/2015 1658   CXR port 5/23 images reviewed> bibasilar atelectasis, R hemidiaphragm elevation   ASSESSMENT / PLAN:  Acute respiratory failure with hypoxemia secondary to significant atelectasis and immobility > no real improvement on 5/23.  I have personally reviewed her images dating back to January 2015.  She did not have diaphragm paralysis in 10/2013, but she did have R hemidiaphram elevation by the time of her first PET last year.  I suspect the lymphadenopathy related to her breast cancer caused this and the recent pneumonia has caused worsening hypoxemia.  Suspect her oxygenation can improve if she could ambulate, participate in IPPV.  CPAP or BIPAP would help, but we can't do that now due to her recent sinus surgery. Hypoxemia - as above, if no improvement this week consider  bubble study to look for intracardiac shunt HCAP - treated   Plan: -continue Vibravest bid -add IPPV for albuterol QID -ambulate -to chair -change pulmicort to 0.5mg  bid -if no improvement in the next 2-3 days will order bubble study  OSA  Plan:  - Unfortunately the pt is not able to wear cpap post sinus surgery. Can use supplemental oxygen by face tent during sleep. - Consider minimizing her sedating medications if she can tolerate this -discussed situation with Dr. [name] today, he says she should be able to use CPAP again by the end of this week  Stage IV breast CA with brain  mets Thrombocytopenia  Plan: - Per primary   GOC:  Appreciate palliative medicine involvement.  Per Oncology she should not be on hospice as she has many treatment options.  Roselie Awkward, MD [facility] Pager: [phone] Cell: [phone] If no response, call [phone]

## 2015-03-15 NOTE — Clinical Social Work Note (Signed)
CSW received consult for patient who needed assistance with financial matters and preparing a will.  CSW spoke to patient, who said she had a lawyer come fill out paperwork with her and her family and she did not have any other questions for CSW.  CSW to sign off, please reconsult if other social work needs arise.  [employer], MSW, [place] 03/15/2015 3:14 PM

## 2015-03-15 NOTE — Progress Notes (Addendum)
Daily Progress Note   Patient Name: Bianca Kent       Date: 03/15/2015 DOB: 17-Oct-1966  Age: 49 y.o. MRN#: [id number] Attending Physician: Caren Griffins, MD Primary Care Physician: [name], MD Admit Date: 03/03/2015  Reason for Consultation/Follow-up: Family-clinician negotiation  Subjective: Sheccid reports feeling conflicted about her goals of care and about her prognosis and treatment options. Indeed it has been an unexpected prolonged hospitalization and unanticipated post op complications- she tells me she has a deep desire to get better if this is possible. She feels conflicted and is hearing different messages.   Interval Events: Palliative Meeting on 5/22 Recommendations made: "A reasonable avenue to pursue is see how she does over next few days and see if this 1. Leads to clinical improvement and 2. Influences Najma's desire to pursue home hospice care. At this time, from discussions they have had with multiple providers, it seems Loda wishes to pursue home hospice care at time of discharge. Hospice liaison to meet with them today. I think if she does go home with hospice care and does have improvement, she could "graduate" from hospice and resume oncologic interventions should she desire"  Length of Stay: 12 days  Current Medications: Scheduled Meds:  . acidophilus  2 capsule Oral BID  . albuterol  2.5 mg Nebulization QID  . antiseptic oral rinse  7 mL Mouth Rinse BID  . budesonide  0.5 mg Nebulization BID  . calcium carbonate  2 tablet Oral BID  . dronabinol  10 mg Oral TID AC  . enoxaparin (LOVENOX) injection  40 mg Subcutaneous Q24H  . feeding supplement (ENSURE ENLIVE)  237 mL Oral TID BM  . fentaNYL  50 mcg Transdermal Q72H  . furosemide  20 mg Intravenous Once  . guaiFENesin  1,200 mg Oral BID  . lactobacillus  1 g Oral TID WC  . lidocaine  15 mL Mouth/Throat Q4H  . methylphenidate  10 mg Oral BID  . multivitamin with minerals  1 tablet Oral Daily    . OLANZapine  5 mg Oral QHS  . pantoprazole  40 mg Oral Daily  . polyethylene glycol  17 g Oral Daily  . senna-docusate  2 tablet Oral BID  . sodium chloride  10-40 mL Intracatheter Q12H  . Vitamin D (Ergocalciferol)  50,000 Units Oral Q Thu    Continuous Infusions:    PRN Meds: acetaminophen, albuterol, ALPRAZolam, bismuth subsalicylate, diphenhydrAMINE, docusate sodium, docusate sodium, HYDROmorphone (DILAUDID) injection, ibuprofen, metoCLOPramide, ondansetron (ZOFRAN) IV, oxyCODONE, phenol, sodium chloride, traMADol, traZODone  Palliative Performance Scale: 50%     Vital Signs: BP 112/62 mmHg  Pulse 77  Temp(Src) 98.9 F (37.2 C) (Oral)  Resp 19  Ht '5\' 7"'  (1.702 m)  Wt 80.5 kg (177 lb 7.5 oz)  BMI 27.79 kg/m2  SpO2 91%  LMP 04/22/2014 SpO2: SpO2: 91 % O2 Device: O2 Device: Face Tent O2 Flow Rate: O2 Flow Rate (L/min): 10 L/min  Intake/output summary:  Intake/Output Summary (Last 24 hours) at 03/15/15 1255 Last data filed at 03/15/15 1155  Gross per 24 hour  Intake    750 ml  Output   1425 ml  Net   -675 ml   LBM:   Baseline Weight: Weight: 75.297 kg (166 lb) Most recent weight: Weight: 80.5 kg (177 lb 7.5 oz)  Physical Exam Diaphoretic, depressed and ill appearing  Additional Data Reviewed: Recent Labs     03/14/15  0600  03/14/15  1250  03/15/15  0415  WBC  5.7   --   6.0  HGB  7.7*   --   7.9*  PLT  183   --   189  NA  139  139   --   BUN  12  10   --   CREATININE  0.91  0.83   --      Problem List:  Patient Active Problem List   Diagnosis Date Noted  . Paralysis of diaphragm nerve   . Dyspnea and respiratory abnormality   . Appetite loss   . Chronic pain syndrome   . PNA (pneumonia)   . Acute respiratory failure   . HCAP (healthcare-associated pneumonia)   . Confusion   . Hypoxemia   . Acute respiratory failure with hypoxia 03/05/2015  . Postprocedural fever 03/05/2015  . Breast cancer, stage 4 03/05/2015  . Thrombocytopenia  03/05/2015  . Post-operative state 03/03/2015  . Brain metastasis 12/04/2014  . Breast cancer metastasized to multiple sites 03/24/2014  . Chest pain 03/19/2014  . Recurrent boils 02/23/2014     Palliative Care Assessment & Plan   I met with patient and her key family members at their request- lots of anxiety and frustration around care plan for hospice and conflicting prognostic information and treatment choices. I spoke with Dr. Lake Bells who is the rounding pulmonary doctor and Dr. [name] the primary oncologist. All agree that Aika needs additional time and there may be possible options for treating her lung issues and perhaps a referral to hospice was premature-patient herself also saying that she was beyond confused and feels "clueless". In these cases when clinical providers have a division about prognosis or treatment options- I advise more time for the condition to "declare itself"- and also to proceed with caution in terms options placed on or off the table and to not cause distress for the patient.  [place] is reasonable- I recommend a few more days of aggressive treatment and reassess the situation. Patient in complete agreement and tells me she feels "relieved"- I reminded her that this condition is still very serious and that there are no guarantees but at least for a bit longer we will push medically as long as she is not suffering and agreeable to the options.   Code Status:  Limited code No mechanical ventilation  Goals of Care:  To have clear evidence that the issues with her lungs are not reversible-maximize all treatment interventions-take one day at a time- and hope for the best.  To regain strength and QOL and consider continuing treatment for her breast cancer  To be reasonable and accept hospice once she is satisfied all options have been exhausted.  She has a strong desire to be at home- understandably she is mentally and physically  exhausted  Desire for further  Chaplaincy support:yes  3. Symptom Management:  Pain and dyspnea are under reasonable control- PRNs available.  Encouraged deep breathing, mobility and relaxation techniques.  4. Palliative Prophylaxis:  Stool Softner: YES  5. Prognosis: Unable to determine  5. Discharge Planning: [place] Referral for now- she may or may not be ready for these services-will give this a few more days and reassess her goals of care.   Care plan was discussed with Patient, Sisters, Mother, Dr. [name], Dr. Lake Bells.  Thank you for allowing the Palliative Medicine Team to assist in the care of this patient.   Time In: 12 Time Out: 1:05 Total Time 65 min Prolonged Time Billed Yes  Greater than 50%  of this time was spent counseling and coordinating care related to the above assessment and plan.   Acquanetta Chain, DO  03/15/2015, 12:55 PM  Please contact Palliative Medicine Team phone at [phone] for questions and concerns.

## 2015-03-15 NOTE — Progress Notes (Signed)
Physical Therapy Treatment Patient Details Name: Bianca Kent MRN: [id number] DOB: 16-May-1966 Today's Date: 03/15/2015    History of Present Illness Patient is a 49 yo female with complex medical history significant for metastatic breast cancer with mets to the brain undergoing chemotherapy. Patient is s/p bilateral sinus surgery and Draf III sinusotomy w/ removal of fronto-ethmoid mucocele on 5/11 afterwhich patient developed hypoxia and acute respiroatory failure secondary to significant atelectasis.    PT Comments    Pt admitted with above diagnosis. Pt currently with functional limitations due to the deficits listed below (see PT Problem List). Pt ambulates with UE support.  Some unsteadiness but pt receiving blood and possibly due to that.  May need equipment at home but will determine over next few days.  Pt will benefit from skilled PT to increase their independence and safety with mobility to allow discharge to the venue listed below.    Follow Up Recommendations  Home health PT;Supervision/Assistance - 24 hour;Supervision for mobility/OOB     Equipment Recommendations  Rolling walker with 5" wheels    Recommendations for Other Services       Precautions / Restrictions Precautions Precautions: Fall Precaution Comments: watch O2 sats Restrictions Weight Bearing Restrictions: No    Mobility  Bed Mobility Overal bed mobility: Needs Assistance Bed Mobility: Supine to Sit     Supine to sit: Supervision     General bed mobility comments: for safety/management of equipment  Transfers Overall transfer level: Needs assistance Equipment used: Rolling walker (2 wheeled) Transfers: Sit to/from Stand Sit to Stand: Min assist Stand pivot transfers: Min assist          Ambulation/Gait Ambulation/Gait assistance: Min guard;Min assist Ambulation Distance (Feet): 180 Feet Assistive device: 1 person hand held assist (pushing IV pole) Gait Pattern/deviations:  Step-through pattern;Decreased stride length;Drifts right/left Gait velocity: decreased Gait velocity interpretation: Below normal speed for age/gender General Gait Details: some instability noted, easily fatigued with reports of some dizziness during ambulation.   Stairs            Wheelchair Mobility    Modified Rankin (Stroke Patients Only)       Balance Overall balance assessment: Needs assistance Sitting-balance support: No upper extremity supported;Feet supported Sitting balance-Leahy Scale: Good     Standing balance support: Single extremity supported;During functional activity Standing balance-Leahy Scale: Poor Standing balance comment: Requiring UE support today due to LOB at times due to unsteady gait.                     Cognition Arousal/Alertness: Awake/alert Behavior During Therapy: WFL for tasks assessed/performed Overall Cognitive Status: Within Functional Limits for tasks assessed                      Exercises Other Exercises Other Exercises: theraband level 1 exercises - general UB strengthening  - kept below shoulder level    General Comments        Pertinent Vitals/Pain Pain Assessment: Faces Faces Pain Scale: Hurts a little bit Pain Location: generalized pain Pain Intervention(s): Limited activity within patient's tolerance;Monitored during session;Repositioned  88-94% O2 on 60% face tent, 119/71    Home Living                      Prior Function            PT Goals (current goals can now be found in the care plan section) Acute Rehab PT Goals Patient Stated  Goal: to go home Progress towards PT goals: Progressing toward goals    Frequency  Min 3X/week    PT Plan Current plan remains appropriate    Co-evaluation             End of Session Equipment Utilized During Treatment: Gait belt;Oxygen (face tent 60% O2) Activity Tolerance: Patient tolerated treatment well;Patient limited by  fatigue Patient left: in chair;with call bell/phone within reach;with family/visitor present     Time: 1530-1600 PT Time Calculation (min) (ACUTE ONLY): 30 min  Charges:  $Gait Training: 8-22 mins                    G CodesDenice Paradise 22-Mar-2015, 5:04 PM [employer] Acute Rehabilitation [phone] (pager)

## 2015-03-15 NOTE — Care Management Note (Signed)
Case Management Note  Patient Details  Name: Bianca Kent MRN: [id number] Date of Birth: Oct 16, 1966  Subjective/Objective:                    Action/Plan:   Expected Discharge Date:                  Expected Discharge Plan:  Home w Hospice Care  In-House Referral:     Discharge planning Services  CM Consult  Post Acute Care Choice:  Hospice Choice offered to:  Patient  DME Arranged:  3-N-1, Hospital bed, Oxygen, Overbed table, Walker rolling DME Agency:  [place]:  RN [facility] Agency:  [place]  Status of Service:  Completed, signed off  Medicare Important Message Given:    Date Medicare IM Given:    Medicare IM give by:    Date Additional Medicare IM Given:    Additional Medicare Important Message give by:     If discussed at [place] of Stay Meetings, dates discussed:  03/16/15  Additional Comments:  Lacretia Leigh, RN 03/15/2015, 9:15 AM

## 2015-03-15 NOTE — Progress Notes (Signed)
OT Cancellation Note  Patient Details Name: Bianca Kent MRN: [id number] DOB: 01-28-1966   Cancelled Treatment:    Reason Eval/Treat Not Completed: Patient at procedure or test/ unavailable;Other (comment) (signing documents; receiving second bag of blood. Will return)  [place], OTR/L  [id number] 03/15/2015 03/15/2015, 2:51 PM

## 2015-03-15 NOTE — Progress Notes (Signed)
PROGRESS NOTE  Bianca Kent DGL:[id number] DOB: 02-08-1966 DOA: 03/03/2015 PCP: Bianca Napoleon, MD  HPI: 49 year old female with a history of metastatic breast cancer to the brain (Dr. Marin Kent), asthma, depression, GERD, was admitted on 03/03/2015 for elective outpatient sinus surgery. The patient underwent a left frontal ethmoid mucocele drainage performed by Dr. [name]. During the surgery, the patient also had her anterior Reform CSF leak from the mucocele repaired with cautery. Postoperatively, the patient has some hypoxemia, and therefore she was kept overnight. On postoperative day #2, the patient's oxygen saturations have somewhat improved and the patient was about to be discharged home. Unfortunately, the patient began developing fever and subsequently was found to have hypoxemia with oxygen saturations in the mid 70s on room air.   Subjective / 24 H Interval events - she is confused and conflicted, wants to understand more of her condition - is tired of being here and wants to go home  Assessment/Plan: Active Problems:   Post-operative state   Acute respiratory failure with hypoxia   Postprocedural fever   Breast cancer, stage 4   Thrombocytopenia   Confusion   Hypoxemia   HCAP (healthcare-associated pneumonia)   Acute respiratory failure   PNA (pneumonia)   Dyspnea and respiratory abnormality   Appetite loss   Chronic pain syndrome  Acute hypoxic and hypercarbic respiratory failure - ongoing, requires 100% FIO2 via mask - CT scan 5/20 showed right middle/lower lobes collapse, bilateral opacifications. 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 No PE - has recently received chemotherapy - appreciate Pulmonary consult - her respiratory status continues to remain very tenuous, closely monitor for decompensation - finished Vancomycin / Zosyn for presumed PNA 5/13 >> 5/21 for 9 days - will get 2U pRBC today per Dr. Marin Kent  Edema - 166 lbs on admission 5/11  >> 5/19 186 lbs >> 5/20 178 lbs >> 179 5/22, ?accuracy since clinically she looks with less edema - continue IV Lasix today as she is getting blood products - continue daily weights - no evidence of heart failure on 2D echo  Hypokalemia - due to Lasix, monitor  Oral thrush and odynophagia,  - improving  Thrombocytopenia / Anemia - from chemo vs infection vs consumption.  - platelets now normal, Hb stable, 2U today  Metastatic breast cancer with brain mets - Reported has not been getting full dose chemo due to recent illness, followed by Dr. [name]. - discussed about her care with Dr. Marin Kent 5/21  S/p bilateral sinus surgery and Draf III frontal sinusotomy 03/03/2015. - Per ENT, discussed with Dr. [name] today   Constipation:  - stool softener   Diet: Diet regular Room service appropriate?: Yes; Fluid consistency:: Thin Fluids: none  DVT Prophylaxis: lovenox on 5/15 stopped due to worsening of thrombocytopenia. Resumed 5/18   Code Status: Partial Code Family Communication: d/w sisters bedside  Disposition Plan: remain in SDU  Consultants:  PCCM  Palliative  Procedures:  bilateral sinus surgery and Draf III frontal sinusotomy 03/03/2015   Antibiotics Vancomycin 5/13 >> 5/21 Zosyn 5/13 >> 5/21   Studies  Ct Angio Chest Pe W/cm &/or Wo Cm 03/12/2015 1. No definite evidence for pulmonary embolism. Evaluation of the distal segmental and subsegmental pulmonary arteries limited due to motion artifact. 2. Interval development of extensive pulmonary consolidation throughout left lung particularly involving the left lower lobe. Findings are nonspecific however may be secondary to an infectious or inflammatory process. Additionally there are new enlarged left hilar lymph nodes which are nonspecific however  potentially secondary to the infectious or inflammatory process. Continued radiographic follow-up is recommended to ensure resolution. 3. Suggestion of increased  opacification within the right upper lobe which may be secondary to radiation pneumonitis or potentially an infectious/inflammatory process. Metastatic disease not excluded. Recommend attention on followup. 4. Unchanged collapse/consolidation within the right middle and lower lobes with right hemidiaphragm elevation.     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     Objective  Filed Vitals:   03/15/15 0000 03/15/15 0342 03/15/15 0410 03/15/15 0745  BP:   90/55 95/55  Pulse:  77    Temp:   98.3 F (36.8 C) 97.7 F (36.5 C)  TempSrc:   Oral Axillary  Resp: 18 20 24 20   Height:   5\' 7"  (1.702 m)   Weight:   80.5 kg (177 lb 7.5 oz)   SpO2: 91% 91% 100% 97%    Intake/Output Summary (Last 24 hours) at 03/15/15 0857 Last data filed at 03/15/15 0400  Gross per 24 hour  Intake    600 ml  Output   2925 ml  Net  -2325 ml   Filed Weights   03/12/15 1200 03/14/15 0400 03/15/15 0410  Weight: 81 kg (178 lb 9.2 oz) 81.5 kg (179 lb 10.8 oz) 80.5 kg (177 lb 7.5 oz)   Exam:  General:  NAD, looks more comfortable this morning  HEENT: no scleral icterus, PERRL  Cardiovascular: RRR without MRG, 2+ peripheral pulses, trace LE edema  Respiratory: shallow breathing, no wheezing, decreased sounds at the bases  Abdomen: soft, non tender, BS +, no guarding  MSK/Extremities: no clubbing/cyanosis, no joint swelling  Skin: no rashes  Neuro: non focal  Data Reviewed: Basic Metabolic Panel:  Recent Labs Lab 03/11/15 0542 03/12/15 0342 03/13/15 0657 03/14/15 0600 03/14/15 1250 03/15/15 0415  NA 140 139 142 139 139  --   K 3.1* 4.1 3.9 3.7 3.6  --   CL 100* 96* 99* 98* 99*  --   CO2 33* 32 36* 36* 34*  --   GLUCOSE 117* 85 78 84 100*  --   BUN 7 9 7 12 10   --   CREATININE 0.88 0.91 0.85 0.91 0.83  --   CALCIUM 8.0* 8.7* 8.9 8.7* 9.1  --   MG 2.3 2.1 2.5* 2.3  --  2.2  PHOS 4.4 5.1*  --   --   --   --    CBC:  Recent Labs Lab  03/11/15 0542 03/12/15 0342 03/13/15 0657 03/14/15 0600 03/15/15 0415  WBC 5.4 7.2 6.0 5.7 6.0  HGB 8.1* 8.8* 7.8* 7.7* 7.9*  HCT 24.5* 27.6* 24.8* 24.7* 25.3*  MCV 86.3 86.8 88.6 88.2 87.5  PLT 135* 167 156 183 189   BNP (last 3 results)  Recent Labs  03/05/15 1851  BNP 48.8    Recent Results (from the past 240 hour(s))  Culture, blood (routine x 2)     Status: None   Collection Time: 03/05/15  3:00 PM  Result Value Ref Range Status   Specimen Description BLOOD LEFT HAND  Final   Special Requests BOTTLES DRAWN AEROBIC AND ANAEROBIC 10CC  Final   Culture   Final    NO GROWTH 5 DAYS Performed at [facility]    Report Status 03/12/2015 FINAL  Final  Culture, blood (routine x 2)     Status: None   Collection Time: 03/05/15  3:10 PM  Result Value Ref Range Status  Specimen Description BLOOD RIGHT HAND  Final   Special Requests BOTTLES DRAWN AEROBIC AND ANAEROBIC 2CC  Final   Culture   Final    NO GROWTH 5 DAYS Note: Culture results may be compromised due to an inadequate volume of blood received in culture bottles. Performed at [facility]    Report Status 03/12/2015 FINAL  Final  Urine culture     Status: None   Collection Time: 03/05/15  7:00 PM  Result Value Ref Range Status   Specimen Description URINE, CLEAN CATCH  Final   Special Requests NONE  Final   Colony Count NO GROWTH Performed at [facility]   Final   Culture NO GROWTH Performed at [facility]   Final   Report Status 03/06/2015 FINAL  Final     Scheduled Meds: . sodium chloride   Intravenous Once  . acidophilus  2 capsule Oral BID  . albuterol  2.5 mg Nebulization 3 times per day  . antiseptic oral rinse  7 mL Mouth Rinse BID  . budesonide  0.25 mg Nebulization 3 times per day  . calcium carbonate  2 tablet Oral BID  . dextromethorphan-guaiFENesin  1 tablet Oral BID  . dronabinol  10 mg Oral TID AC  . enoxaparin (LOVENOX) injection  40 mg Subcutaneous Q24H   . feeding supplement (ENSURE ENLIVE)  237 mL Oral TID BM  . fentaNYL  50 mcg Transdermal Q72H  . furosemide  20 mg Intravenous Once  . lactobacillus  1 g Oral TID WC  . lidocaine  15 mL Mouth/Throat Q4H  . methylphenidate  10 mg Oral BID  . multivitamin with minerals  1 tablet Oral Daily  . OLANZapine  5 mg Oral QHS  . pantoprazole  40 mg Oral Daily  . polyethylene glycol  17 g Oral Daily  . senna-docusate  2 tablet Oral BID  . sodium chloride  10-40 mL Intracatheter Q12H  . Vitamin D (Ergocalciferol)  50,000 Units Oral Q Thu   Continuous Infusions:   Time spent: 25 minutes, >50% involved in family discussions, care coordination with consultants  Marzetta Board, MD [facility] Pager [phone]. If 7 PM - 7 AM, please contact night-coverage at [email or web address], password [email or web address] 03/15/2015, 8:57 AM  LOS: 12 days

## 2015-03-15 NOTE — Progress Notes (Signed)
Occupational Therapy Treatment Patient Details Name: Bianca Kent MRN: [id number] DOB: [birth year] Today's Date: 03/15/2015    History of present illness Patient is a 49 yo female with complex medical history significant for metastatic breast cancer with mets to the brain undergoing chemotherapy. Patient is s/p bilateral sinus surgery and Draf III sinusotomy w/ removal of fronto-ethmoid mucocele on 5/11 afterwhich patient developed hypoxia and acute respiroatory failure secondary to significant atelectasis.   OT comments  Pt seen as co-treat with PT. Able to ambulate with +2 min A with O2 Sats 97-100 15L 55% using tent mask. Participated with ADL and very motivated to begin working with OT &PT. Given level 1 theraband to work on [employer] and increasing endurance. Will continue to follow to address established goals. At this time, recommend HHOT. Will adjust plan of care as needed in order to address pt's needs.   Follow Up Recommendations  Home health OT;Supervision/Assistance - 24 hour    Equipment Recommendations  3 in 1 bedside comode    Recommendations for Other Services      Precautions / Restrictions Precautions Precautions: Fall Precaution Comments: watch O2 sats       Mobility Bed Mobility Overal bed mobility: Needs Assistance Bed Mobility: Supine to Sit     Supine to sit: Supervision     General bed mobility comments: for safety/management of equipment  Transfers Overall transfer level: Needs assistance   Transfers: Sit to/from Stand;Stand Pivot Transfers Sit to Stand: Min assist Stand pivot transfers: Min assist            Balance Overall balance assessment: Needs assistance   Sitting balance-Leahy Scale: Good     Standing balance support: During functional activity;Bilateral upper extremity supported Standing balance-Leahy Scale: Poor                     ADL Overall ADL's : Needs assistance/impaired     Grooming: Set up    Upper Body Bathing: Set up;Sitting   Lower Body Bathing: Minimal assistance;Sit to/from stand   Upper Body Dressing : Set up;Sitting;Supervision/safety   Lower Body Dressing: Minimal assistance;Sit to/from stand   Toilet Transfer: Minimal assistance;Ambulation   Toileting- Clothing Manipulation and Hygiene: Minimal assistance;Sit to/from stand       Functional mobility during ADLs: Minimal assistance;+2 for physical assistance;+2 for safety/equipment. Unsteady at times. R and posterior bias. Pt appears aware of weakness/deficits.  General ADL Comments: Pt very motivated to participate with ADL. Began education on energy conservation techniques. RR incresaed to 34 during ADL. Cues to take breaks and slow doen activity.       Vision                 Additional Comments: Pt reports diplopia is intermittent  Will further assess              Cognition   Behavior During Therapy: WFL for tasks assessed/performed Overall Cognitive Status: Within Functional Limits for tasks assessed                       Extremity/Trunk Assessment   general UE weakness            Exercises Other Exercises Other Exercises: theraband level 1 exercises - general UB strengthening  - kept below shoulder level   Shoulder Instructions       General Comments      Pertinent Vitals/ Pain       Pain Assessment: Faces Faces  Pain Scale: Hurts a little bit Pain Location: general discomfort Pain Intervention(s): Monitored during session  RR 34 for short period when ambulating. O2 97-100 15L 55% FiO2.   Home Living                                          Prior Functioning/Environment              Frequency Min 3X/week     Progress Toward Goals  OT Goals(current goals can now be found in the care plan section)  Progress towards OT goals: Goals met and updated - see care plan  Acute Rehab OT Goals Patient Stated Goal: to go home OT Goal  Formulation: With patient/family Time For Goal Achievement: 03/25/15 Potential to Achieve Goals: Good ADL Goals Pt Will Perform Lower Body Dressing: with supervision;with caregiver independent in assisting;sit to/from stand Pt Will Transfer to Toilet: with supervision;ambulating (with caregiver independent in assisting) Pt Will Perform Toileting - Clothing Manipulation and hygiene: with modified independence;sit to/from stand Pt/caregiver will Perform Home Exercise Program: Increased strength;Both right and left upper extremity;With theraband;With written HEP provided (level 1) Additional ADL Goal #1: Pt will verbalize understanding of 3 energy conservation techniques for ADL  Plan Discharge plan needs to be updated    Co-evaluation                 End of Session Equipment Utilized During Treatment: Oxygen (60% 12L)   Activity Tolerance Patient tolerated treatment well   Patient Left in chair;with call bell/phone within reach   Nurse Communication Mobility status        Time: 1530-1600 OT Time Calculation (min): 30 min  Charges: OT General Charges $OT Visit: 1 Procedure OT Treatments $Self Care/Home Management : 8-22 mins  Telsa Dillavou,HILLARY 03/15/2015, 4:31 PM   [facility], OTR/L  [phone] 03/15/2015

## 2015-03-15 NOTE — Progress Notes (Signed)
Nutrition Follow-up  DOCUMENTATION CODES:  Not applicable  INTERVENTION:  Ensure Enlive (each supplement provides 350kcal and 20 grams of protein), MVI  NUTRITION DIAGNOSIS:  Inadequate oral intake related to other (see comment), mouth pain (thrush) as evidenced by meal completion < 25%.  Ongoing  GOAL:  Patient will meet greater than or equal to 90% of their needs  Unmet  MONITOR:  PO intake, Supplement acceptance, Labs, Weight trends, Skin, I & O's  REASON FOR ASSESSMENT:  Consult Assessment of nutrition requirement/status  ASSESSMENT: This is a 49 year old with breast cancer and history of bilateral sinus surgery with left cribriform CSF leak repaired with fat who presents with left frontoethmoid mucocele, chronic sinusitis. She now presents for revision bilateral sinus surgery and Draf III frontal sinusotomy. Dr. [name], [place] Dust has discussed the risks (bleeding, infection, orbital injury/blindness, CSF leak, scarring, need for further surgery, etc.), benefits, and alternatives of this procedure. The patient understands the risks and would like to proceed with the procedure. The chances of success of the procedure are >50% and the patient understands this. I personally performed an examination of the patient within 24 hours of the procedure.  S/p Procedures on 03/03/15:  revision bilateral sinus surgery, Draf 3 frontal sinusotomy, and removal of left fronto-ethmoid mucocele  Pt in with multiple providers at time of visit. Pt was transferred to SDU due to declining respiratory status.   Pt remains on treatment for oral thrush. Intake remains poor (5-50%). Pt is consuming Ensure supplements ordered per MAR.   Noted increase in wt, likely due to edema. Pt on lasix.   Palliative following. They are continuing to coordinate with pt, family, and consulting physicians on goals of care. Plan is to see how pt progresses over the next few days prior to coming to a final  decision.   Height:  Ht Readings from Last 1 Encounters:  03/15/15 5\' 7"  (1.702 m)    Weight:  Wt Readings from Last 1 Encounters:  03/15/15 177 lb 7.5 oz (80.5 kg)    Ideal Body Weight:  61.4 kg  Wt Readings from Last 10 Encounters:  03/15/15 177 lb 7.5 oz (80.5 kg)  02/25/15 166 lb (75.297 kg)  02/22/15 165 lb (74.844 kg)  02/12/15 164 lb (74.39 kg)  01/07/15 162 lb (73.483 kg)  12/17/14 164 lb (74.39 kg)  12/11/14 165 lb (74.844 kg)  11/26/14 164 lb 12.8 oz (74.753 kg)  11/19/14 165 lb (74.844 kg)  10/08/14 178 lb (80.74 kg)    BMI:  Body mass index is 27.79 kg/(m^2). Overweight  Estimated Nutritional Needs:  Kcal:  2050-2250  Protein:  95-105 grams  Fluid:  2.0-2.2 L  Skin:  Reviewed, no issues (closed bilateral nose incision)  Diet Order:  Diet regular Room service appropriate?: Yes; Fluid consistency:: Thin  EDUCATION NEEDS:  No education needs identified at this time   Intake/Output Summary (Last 24 hours) at 03/15/15 1421 Last data filed at 03/15/15 1328  Gross per 24 hour  Intake 1137.5 ml  Output   1425 ml  Net -287.5 ml    Last BM:  03/15/15  Ivalee Strauser A. Jimmye Norman, RD, LDN, CDE Pager: [phone] After hours Pager: [phone]

## 2015-03-15 NOTE — Progress Notes (Signed)
I am absolutely perplexed as to why she is going home on hospice. Hospice will not be because of her breast cancer. As such, I will not be the hospice physician for her.  She walked in for her surgery. She is doing great. She just got back from vacation. Everything that has gone with her has happened since being in the hospital.  She had scans done which showed infection/inflammation. I don't know why she would have a paralyzed right diaphragm. Again she did not have this when she came into the hospital.  To me, it seems as if decisions with her care are being based on a condition that is under very good control.  I would have to think that her lungs will get better since she had absolute no lung issues before she got to the hospital.  She is anemic. I think a blood transfusion clearly will help her.  I would think that steroids also good help. I am not sure as to what type of "allergy" that she has to prednisone.  Understandably, her family is very confused. I'm just not sure he is making the decisions with respect to hospice care for her.  I do not agree with hospice for her. As far as her breast cancer is concerned, this is been under good control. Even if she has progression of her disease, we have a lot of options that we can utilize to treat her. She has triple positive rest cancer which typically does well with second and third line therapies.  I'm not sure as to why the doctors feel there is nothing that can be done for her. This would makes no sense to me.  I spoke to her sister. I spoke her mom yesterday.  It just is very unclear as to why these decisions are being made.  Her chest x-ray today looks better. She has improved aeration of the left lung. Again, I will like to think that this hospital-acquired infection will improve over time. I just think that she needs continued interventions to help.  Lum Keas  Ephesians 6:7-8

## 2015-03-15 NOTE — Progress Notes (Signed)
PT/OT Cancellation Note  Patient Details Name: Bianca Kent MRN: [id number] DOB: 05-Oct-1966   Cancelled Treatment:    Reason Eval/Treat Not Completed: Medical issues which prohibited therapy;Patient at procedure or test/ unavailable Pt receiving blood products. Will return this pm. Bradley, OTR/L  [phone] 03/15/2015 03/15/2015, 12:04 PM

## 2015-03-16 ENCOUNTER — Inpatient Hospital Stay (HOSPITAL_COMMUNITY): Payer: 59

## 2015-03-16 ENCOUNTER — Other Ambulatory Visit (HOSPITAL_COMMUNITY): Payer: 59

## 2015-03-16 DIAGNOSIS — K1230 Oral mucositis (ulcerative), unspecified: Secondary | ICD-10-CM

## 2015-03-16 DIAGNOSIS — R0602 Shortness of breath: Secondary | ICD-10-CM

## 2015-03-16 LAB — BASIC METABOLIC PANEL
ANION GAP: 8 (ref 5–15)
BUN: 12 mg/dL (ref 6–20)
CHLORIDE: 97 mmol/L — AB (ref 101–111)
CO2: 33 mmol/L — ABNORMAL HIGH (ref 22–32)
Calcium: 9.2 mg/dL (ref 8.9–10.3)
Creatinine, Ser: 0.95 mg/dL (ref 0.44–1.00)
GFR calc Af Amer: >60 mL/min (ref >60)
GFR calc non Af Amer: >60 mL/min (ref >60)
Glucose, Bld: 90 mg/dL (ref 65–99)
Potassium: 3.8 mmol/L (ref 3.5–5.1)
Sodium: 138 mmol/L (ref 135–145)

## 2015-03-16 LAB — CBC
HEMATOCRIT: 31.7 % — AB (ref 36.0–46.0)
HEMOGLOBIN: 10.4 g/dL — AB (ref 12.0–15.0)
MCH: 28.7 pg (ref 26.0–34.0)
MCHC: 32.8 g/dL (ref 30.0–36.0)
MCV: 87.3 fL (ref 78.0–100.0)
Platelets: 191 10*3/uL (ref 150–400)
RBC: 3.63 MIL/uL — ABNORMAL LOW (ref 3.87–5.11)
RDW: 17.4 % — ABNORMAL HIGH (ref 11.5–15.5)
WBC: 7.3 10*3/uL (ref 4.0–10.5)

## 2015-03-16 LAB — TYPE AND SCREEN
ABO/RH(D): O POS
ANTIBODY SCREEN: NEGATIVE
Unit division: 0
Unit division: 0

## 2015-03-16 LAB — MAGNESIUM: Magnesium: 2.3 mg/dL (ref 1.7–2.4)

## 2015-03-16 NOTE — Progress Notes (Signed)
*  PRELIMINARY RESULTS* Echocardiogram 2D Echocardiogram bubble study limited has been performed.  Leavy Cella 03/16/2015, 4:09 PM

## 2015-03-16 NOTE — Consult Note (Signed)
   [facility] CM Inpatient Consult   03/16/2015  Bianca Kent 03-Apr-1966 [id number]   Came to visit patient at bedside on behalf of [employer] program for Organ employees/dependents with [employer]. Left contact information at bedside. She is agreeable to post hospital discharge call.  Made inpatient RNCM aware of visit.   Marthenia Rolling, MSN-Ed, RN,BSN [facility] Liaison [phone]

## 2015-03-16 NOTE — Progress Notes (Signed)
I very much appreciate all the care that this take is getting. This has been a very difficult time for her and her family. She just had a lot of issues following her sinus surgery.  Pulmonary is helping out quite a bit. Unfortunate, will we cannot use CPAP because she has her sinuses that are packed from her surgery. Hopefully, her CPAP can be started later on this week. She got 2 units of blood yesterday. Her "color" look better. She does feel a little bit stronger.  She still is not getting out of bed all that much. It certainly would help to try to get her out of bed to sit up to try to help mobilize secretions.  Her labs today show her white cell count to be 7.3.. Hemoglobin 10.4. Platelets count is 191,000. Her tongue still is sore. She has had problems with this from her chemotherapy. I will try her on some sodium bicarbonate mouth rinses to see if this can help a little bit.  Hopefully, she will make slow but steady improvement.  I spoke to her sister today. The family is quite pleased that she is able to stay and have more involvement by specialists to try to get her status better.  Her last chemotherapy was back on May 5. As such, we are still in good shape if we need to hold her chemotherapy for a couple weeks or more. Her graphite may have to make a change with her treatment protocol given the issues that she has with her tongue. Because of the mucositis with her tongue, is hard for her to eat.  We will continue to help follow along. Again, I am so appreciative of the great care that she is getting in the CCU!!!  Lum Keas  Exodus 23:25

## 2015-03-16 NOTE — Progress Notes (Addendum)
PROGRESS NOTE  Bianca Kent BDZ:[id number] DOB: September 21, 1966 DOA: 03/03/2015 PCP: [name], MD  HPI: 49 year old female with a history of metastatic breast cancer to the brain (Dr. [name]), asthma, depression, GERD, was admitted on 03/03/2015 for elective outpatient sinus surgery. The patient underwent a left frontal ethmoid mucocele drainage performed by Dr. [name]. During the surgery, the patient also had her anterior Reform CSF leak from the mucocele repaired with cautery. Postoperatively, the patient has some hypoxemia, and therefore she was kept overnight. On postoperative day #2, the patient's oxygen saturations have somewhat improved and the patient was about to be discharged home. Unfortunately, the patient began developing fever and subsequently was found to have hypoxemia with oxygen saturations in the mid 70s on room air.   She was started on broad spectrum antibiotics as there was high suspicion for aspiration pneumonia and Pulmonary was consulted. Patient has been persistently hypoxemic and required transfer to SDU on 5/20 for increased oxygen requirements. She has finished 9 days of Vancomycin and Zosyn for presumed HCAP.   Subjective / 24 H Interval events - slept well last night - ambulated yesterday, she had a lot of visitors and feels tired  Assessment/Plan: Active Problems:   Post-operative state   Acute respiratory failure with hypoxia   Postprocedural fever   Breast cancer, stage 4   Thrombocytopenia   Confusion   Hypoxemia   HCAP (healthcare-associated pneumonia)   Acute respiratory failure   PNA (pneumonia)   Dyspnea and respiratory abnormality   Appetite loss   Chronic pain syndrome   Paralysis of diaphragm nerve   Acute hypoxic and hypercarbic respiratory failure - ongoing, requires 100% FIO2 via mask, likely due to significant atelectasis and immobility, right diaphragm paralylsis - CT scan 5/20 showed right middle/lower lobes collapse,  bilateral opacifications.  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  - No PE on CT angio - appreciate Pulmonary consult, for now encourage ambulation, Vibravest, Pulmicort.  - bubble study to look for shunt today  - her respiratory status continues to remain very tenuous, closely monitor for decompensation - finished Vancomycin / Zosyn for presumed PNA 5/13 >> 5/21 for 9 days  Edema - 166 lbs on admission 5/11 >> 5/19 186 lbs >> 5/20 178 lbs >> 179 5/22 >> 172 5/24 - hold Lasix, she was hypotensive this morning - continue daily weights - no evidence of heart failure on 2D echo  Hypotension - likely in the setting of diuresis, hold Lasix  Hypokalemia - due to Lasix, monitor - K normal this morning  Oral thrush and odynophagia,  - improving  Thrombocytopenia / Anemia - from chemo vs infection vs consumption.  - platelets now normal - s/p 2U pRBC 5/23  Metastatic breast cancer with brain mets - Reported has not been getting full dose chemo due to recent illness, followed by Dr. [name]. - discussed about her care with Dr. [name] 5/21  S/p bilateral sinus surgery and Draf III frontal sinusotomy 03/03/2015. - Per ENT, discussed with Dr. [name], we may be able to use CPAP/BIPAP later this weel   Constipation:  - stool softener   Diet: Diet regular Room service appropriate?: Yes; Fluid consistency:: Thin Fluids: none  DVT Prophylaxis: lovenox on 5/15 stopped due to worsening of thrombocytopenia. Resumed 5/18   Code Status: Partial Code Family Communication: d/w sisters bedside  Disposition Plan: remain in SDU  Consultants:  PCCM  Palliative  Procedures:  bilateral sinus surgery and Draf III frontal sinusotomy 03/03/2015  Antibiotics Vancomycin 5/13 >> 5/21 Zosyn 5/13 >> 5/21   Studies  Ct Angio Chest Pe W/cm &/or Wo Cm 03/12/2015 1. No definite evidence for pulmonary embolism. Evaluation of the distal segmental and subsegmental pulmonary arteries  limited due to motion artifact. 2. Interval development of extensive pulmonary consolidation throughout left lung particularly involving the left lower lobe. Findings are nonspecific however may be secondary to an infectious or inflammatory process. Additionally there are new enlarged left hilar lymph nodes which are nonspecific however potentially secondary to the infectious or inflammatory process. Continued radiographic follow-up is recommended to ensure resolution. 3. Suggestion of increased opacification within the right upper lobe which may be secondary to radiation pneumonitis or potentially an infectious/inflammatory process. Metastatic disease not excluded. Recommend attention on followup. 4. Unchanged collapse/consolidation within the right middle and lower lobes with right hemidiaphragm elevation.     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     Objective  Filed Vitals:   03/15/15 2343 03/16/15 0445 03/16/15 0449 03/16/15 0631  BP: 100/53  76/45 95/55  Pulse:      Temp: 98.6 F (37 C) 98.6 F (37 C)    TempSrc: Oral Oral    Resp: 22  24   Height:      Weight:  78.109 kg (172 lb 3.2 oz)    SpO2: 93%  92%     Intake/Output Summary (Last 24 hours) at 03/16/15 0706 Last data filed at 03/16/15 0600  Gross per 24 hour  Intake   1499 ml  Output    875 ml  Net    624 ml   Filed Weights   03/14/15 0400 03/15/15 0410 03/16/15 0445  Weight: 81.5 kg (179 lb 10.8 oz) 80.5 kg (177 lb 7.5 oz) 78.109 kg (172 lb 3.2 oz)   Exam:  General:  NAD, looks comfortable  HEENT: no scleral icterus, PERRL  Cardiovascular: RRR without MRG, 2+ peripheral pulses, trace LE edema  Respiratory: shallow breathing, no wheezing, decreased sounds at the bases  Abdomen: soft, non tender, BS +, no guarding  MSK/Extremities: no clubbing/cyanosis, no joint swelling  Skin: no rashes  Neuro: non focal  Data Reviewed: Basic Metabolic Panel:  Recent  Labs Lab 03/11/15 0542 03/12/15 0342 03/13/15 0657 03/14/15 0600 03/14/15 1250 03/15/15 0415 03/16/15 0335  NA 140 139 142 139 139  --  138  K 3.1* 4.1 3.9 3.7 3.6  --  3.8  CL 100* 96* 99* 98* 99*  --  97*  CO2 33* 32 36* 36* 34*  --  33*  GLUCOSE 117* 85 78 84 100*  --  90  BUN 7 9 7 12 10   --  12  CREATININE 0.88 0.91 0.85 0.91 0.83  --  0.95  CALCIUM 8.0* 8.7* 8.9 8.7* 9.1  --  9.2  MG 2.3 2.1 2.5* 2.3  --  2.2 2.3  PHOS 4.4 5.1*  --   --   --   --   --    CBC:  Recent Labs Lab 03/12/15 0342 03/13/15 0657 03/14/15 0600 03/15/15 0415 03/16/15 0335  WBC 7.2 6.0 5.7 6.0 7.3  HGB 8.8* 7.8* 7.7* 7.9* 10.4*  HCT 27.6* 24.8* 24.7* 25.3* 31.7*  MCV 86.8 88.6 88.2 87.5 87.3  PLT 167 156 183 189 191   BNP (last 3 results)  Recent Labs  03/05/15 1851  BNP 48.8   Scheduled Meds: . acidophilus  2 capsule Oral BID  . albuterol  2.5 mg Nebulization QID  . antiseptic oral rinse  7 mL Mouth Rinse BID  . budesonide  0.5 mg Nebulization BID  . calcium carbonate  2 tablet Oral BID  . dronabinol  10 mg Oral TID AC  . enoxaparin (LOVENOX) injection  40 mg Subcutaneous Q24H  . feeding supplement (ENSURE ENLIVE)  237 mL Oral TID BM  . fentaNYL  50 mcg Transdermal Q72H  . guaiFENesin  1,200 mg Oral BID  . lactobacillus  1 g Oral TID WC  . lidocaine  15 mL Mouth/Throat Q4H  . methylphenidate  10 mg Oral BID  . multivitamin with minerals  1 tablet Oral Daily  . OLANZapine  5 mg Oral QHS  . pantoprazole  40 mg Oral Daily  . polyethylene glycol  17 g Oral Daily  . senna-docusate  2 tablet Oral BID  . sodium chloride  10-40 mL Intracatheter Q12H  . Vitamin D (Ergocalciferol)  50,000 Units Oral Q Thu   Continuous Infusions:   Time spent: 25 minutes, 50% bedside discussing with patient/family members  Marzetta Board, MD [facility] Pager [phone]. If 7 PM - 7 AM, please contact night-coverage at [email or web address], password [facility] 03/16/2015, 7:06 AM  LOS: 13 days

## 2015-03-16 NOTE — Progress Notes (Signed)
Name: Bianca Kent MRN: [id number] DOB: 1966-09-17    ADMISSION DATE:  03/03/2015 CONSULTATION DATE:  5/14  REFERRING MD :  Erlinda Hong  CHIEF COMPLAINT: hypoxia/SIRS (possible sepsis)  BRIEF PATIENT DESCRIPTION:  49 year old female w/ sig history of metastatic breast cancer w/ mets to brain (followed by [facility]). Last partial dose chemo 5/5, last xrt to left temporal area Feb 2016. Had remote h/o sinus surgery from which she had CSF leak w/ prior repair. 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 Surgery was uneventful but she was kept in the hospital over night as she had post-op sats in low 90s and needed pain management. Developed fever and had asymptomatic hypoxia w/ room air sats 70s on 5/13. Given IVFs, ABX widened. PCCM asked to see the following day for hypoxia and concern for sepsis. Found to have ATX thought due to suspected OSA, nasal packing, and possibly pain medications.   SIGNIFICANT EVENTS  5/11: bilateral sinus surgery and Draf III sinusotomy w/ removal of fronto-ethmoid mucocele  STUDIES:  Upper extremity doppler 5/15> No evidence upper extremity DVT Echo 8/18 >> nml LV systolic fxn, EF 29-93%, no RWMA, PA wnl  SUBJECTIVE: worn out from yesterday, walked quite a bit, sleepy this morning   VITAL SIGNS: Temp:  [98.4 F (36.9 C)-99.5 F (37.5 C)] 98.8 F (37.1 C) (05/24 0802) Resp:  [12-26] 26 (05/24 0802) BP: (76-126)/(45-73) 109/63 mmHg (05/24 0802) SpO2:  [89 %-96 %] 95 % (05/24 0802) FiO2 (%):  [60 %] 60 % (05/24 0802) Weight:  [78.109 kg (172 lb 3.2 oz)] 78.109 kg (172 lb 3.2 oz) (05/24 0445)  PHYSICAL EXAMINATION: General:  Comfortable in bed HENT: NCAT OP clear  PULM diminished R base, CTA B otherwise CV: RRR, no mgr GI: BS+, soft, nontender MSK: normal bulk and tone Neuro: A&Ox4, maew     Recent Labs Lab 03/14/15 0600 03/14/15 1250  03/16/15 0335  NA 139 139 138  K 3.7 3.6 3.8  CL 98* 99* 97*  CO2 36* 34* 33*  BUN 12 10 12   CREATININE 0.91 0.83 0.95  GLUCOSE 84 100* 90    Recent Labs Lab 03/14/15 0600 03/15/15 0415 03/16/15 0335  HGB 7.7* 7.9* 10.4*  HCT 24.7* 25.3* 31.7*  WBC 5.7 6.0 7.3  PLT 183 189 191   ABG    Component Value Date/Time   PHART 7.421 03/12/2015 1658   PCO2ART 54.2* 03/12/2015 1658   PO2ART 71.0* 03/12/2015 1658   HCO3 35.3* 03/12/2015 1658   TCO2 37 03/12/2015 1658   ACIDBASEDEF 1.0 08/11/2007 2259   O2SAT 94.0 03/12/2015 1658   CXR port 5/23 images reviewed> bibasilar atelectasis, R hemidiaphragm elevation   ASSESSMENT / PLAN:  Acute respiratory failure with severe hypoxemia secondary to significant atelectasis and immobility > perhaps slightly improved 5/24, see discussion of ddx from my note 5/23 (mucus plugging)  HCAP - treated   Plan: -wean O2 as able for O2 saturation > 90% -continue Vibravest bid -add IPPV for albuterol QID -ambulate -to chair -continue pulmicort to 0.5mg  bid -repeat CXR 2 view 5/25 AM -check bubble study on 5/24  OSA  Plan:  - Unfortunately the pt is not able to wear cpap post sinus surgery. Can use supplemental oxygen by face tent during sleep. - Consider minimizing her sedating medications if she can tolerate this -discussed situation with Dr. [name] 5/23, he says she  should be able to use CPAP again by the end of this week  Stage IV breast CA with brain mets Thrombocytopenia  Plan: - Per primary/oncology   GOC:  Appreciate palliative medicine involvement.  Plan to continue aggressive measures in the hospital to try to improve oxygenation   Roselie Awkward, MD [place] [facility] Pager: [phone] Cell: [phone] If no response, call [phone]

## 2015-03-17 ENCOUNTER — Ambulatory Visit: Payer: 59

## 2015-03-17 ENCOUNTER — Inpatient Hospital Stay (HOSPITAL_COMMUNITY): Payer: 59

## 2015-03-17 DIAGNOSIS — Z515 Encounter for palliative care: Secondary | ICD-10-CM

## 2015-03-17 DIAGNOSIS — J9811 Atelectasis: Secondary | ICD-10-CM | POA: Insufficient documentation

## 2015-03-17 LAB — CBC
HCT: 22.6 % — ABNORMAL LOW (ref 36.0–46.0)
HEMATOCRIT: 31.7 % — AB (ref 36.0–46.0)
HEMOGLOBIN: 7.3 g/dL — AB (ref 12.0–15.0)
Hemoglobin: 10.1 g/dL — ABNORMAL LOW (ref 12.0–15.0)
MCH: 28.4 pg (ref 26.0–34.0)
MCH: 28.1 pg (ref 26.0–34.0)
MCHC: 32.3 g/dL (ref 30.0–36.0)
MCHC: 31.9 g/dL (ref 30.0–36.0)
MCV: 87.9 fL (ref 78.0–100.0)
MCV: 88.1 fL (ref 78.0–100.0)
Platelets: 132 10*3/uL — ABNORMAL LOW (ref 150–400)
Platelets: 198 10*3/uL (ref 150–400)
RBC: 2.57 MIL/uL — ABNORMAL LOW (ref 3.87–5.11)
RBC: 3.6 MIL/uL — ABNORMAL LOW (ref 3.87–5.11)
RDW: 17.4 % — ABNORMAL HIGH (ref 11.5–15.5)
RDW: 17.4 % — ABNORMAL HIGH (ref 11.5–15.5)
WBC: 3.9 10*3/uL — ABNORMAL LOW (ref 4.0–10.5)
WBC: 4.9 10*3/uL (ref 4.0–10.5)

## 2015-03-17 LAB — BASIC METABOLIC PANEL
ANION GAP: 8 (ref 5–15)
BUN: 11 mg/dL (ref 6–20)
CALCIUM: 8.8 mg/dL — AB (ref 8.9–10.3)
CHLORIDE: 101 mmol/L (ref 101–111)
CO2: 31 mmol/L (ref 22–32)
Creatinine, Ser: 0.82 mg/dL (ref 0.44–1.00)
GFR calc Af Amer: >60 mL/min (ref >60)
GFR calc non Af Amer: >60 mL/min (ref >60)
Glucose, Bld: 84 mg/dL (ref 65–99)
POTASSIUM: 3.3 mmol/L — AB (ref 3.5–5.1)
Sodium: 140 mmol/L (ref 135–145)

## 2015-03-17 LAB — MAGNESIUM: Magnesium: 2.2 mg/dL (ref 1.7–2.4)

## 2015-03-17 MED ORDER — POTASSIUM CHLORIDE CRYS ER 20 MEQ PO TBCR
40.0000 meq | EXTENDED_RELEASE_TABLET | Freq: Once | ORAL | Status: AC
  Administered 2015-03-17: 40 meq via ORAL
  Filled 2015-03-17: qty 2

## 2015-03-17 MED ORDER — BOOST / RESOURCE BREEZE PO LIQD
1.0000 | Freq: Three times a day (TID) | ORAL | Status: DC
  Administered 2015-03-17 – 2015-03-18 (×2): 1 via ORAL

## 2015-03-17 NOTE — Progress Notes (Signed)
Pt walked 1/2 unit.  Tolerated well Saunders Revel T

## 2015-03-17 NOTE — Progress Notes (Signed)
Notified elink.  Redrew labs CBC, BMP, and Mag.  Possible diluted tubes redrew to confirm lab values.  Will continue to monitor. Saunders Revel T

## 2015-03-17 NOTE — Progress Notes (Signed)
PROGRESS NOTE  Bianca Kent IWP:[id number] DOB: 1965/11/07 DOA: 03/03/2015 PCP: [name], MD  HPI: 49 year old female with a history of metastatic breast cancer to the brain (Dr. [name]), asthma, depression, GERD, was admitted on 03/03/2015 for elective outpatient sinus surgery. The patient underwent a left frontal ethmoid mucocele drainage performed by Dr. [name]. During the surgery, the patient also had her anterior Reform CSF leak from the mucocele repaired with cautery. Postoperatively, the patient has some hypoxemia, and therefore she was kept overnight. On postoperative day #2, the patient's oxygen saturations have somewhat improved and the patient was about to be discharged home. Unfortunately, the patient began developing fever and subsequently was found to have hypoxemia with oxygen saturations in the mid 70s on room air.   She was started on broad spectrum antibiotics as there was high suspicion for aspiration pneumonia and Pulmonary was consulted. Patient has been persistently hypoxemic and required transfer to SDU on 5/20 for increased oxygen requirements. She has finished 9 days of Vancomycin and Zosyn for presumed HCAP.   Subjective / 24 H Interval events -Patient states feeling a little better today, breathing easier, tolerating by mouth intake.  Assessment/Plan: Active Problems:   Post-operative state   Acute respiratory failure with hypoxia   Postprocedural fever   Breast cancer, stage 4   Thrombocytopenia   Confusion   Hypoxemia   HCAP (healthcare-associated pneumonia)   Acute respiratory failure   PNA (pneumonia)   Dyspnea and respiratory abnormality   Appetite loss   Chronic pain syndrome   Paralysis of diaphragm nerve   Acute hypoxic and hypercarbic respiratory failure - ongoing, requires 100% FIO2 via mask, likely due to significant atelectasis and immobility, right diaphragm paralylsis - CT scan 5/20 showed right middle/lower lobes collapse,  bilateral opacifications.  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  -Two-view chest x-ray performed this morning showing bibasilar changes with overall level of inspiration improving significantly since prior exam. Prior CT scan performed 03/12/2015 did not show evidence of pulmonary embolism, however, revealed pulmonary consolidation throughout left lung -finished Vancomycin / Zosyn for presumed PNA 5/13 >> 5/21 for 9 days  Edema -Transthoracic echocardiogram performed on 03/08/2015 showed EF 55-60% without evidence for diastolic dysfunction -Repeat chest x-ray is my showing overall improvement  Hypotension -Patient have a blood pressure of 103/76 this morning, we'll continue to monitor. Direct therapy has been discontinued  Hypokalemia -A.m. labs showing a potassium of 3.3, will replace with oral potassium replacement  Oral thrush and odynophagia,  - improving  Thrombocytopenia / Anemia - from chemo vs infection vs consumption.  - platelets now normal - s/p 2U pRBC 5/23  Metastatic breast cancer with brain mets - Reported has not been getting full dose chemo due to recent illness, followed by Dr. [name]. - discussed about her care with Dr. [name] 5/21  S/p bilateral sinus surgery and Draf III frontal sinusotomy 03/03/2015. - Per ENT, discussed with Dr. [name], we may be able to use CPAP/BIPAP later this weel   Constipation:  - stool softener   Diet: Diet regular Room service appropriate?: Yes; Fluid consistency:: Thin Fluids: none  DVT Prophylaxis: lovenox on 5/15 stopped due to worsening of thrombocytopenia. Resumed 5/18   Code Status: Partial Code Family Communication: d/w sisters bedside  Disposition Plan: remain in SDU  Consultants:  PCCM  Palliative  Procedures:  bilateral sinus surgery and Draf III frontal sinusotomy 03/03/2015   Antibiotics Vancomycin 5/13 >> 5/21 Zosyn 5/13 >> 5/21   Studies  Ct Angio Chest Pe W/cm &/or Wo Cm 03/12/2015  1. No definite evidence for pulmonary embolism. Evaluation of the distal segmental and subsegmental pulmonary arteries limited due to motion artifact. 2. Interval development of extensive pulmonary consolidation throughout left lung particularly involving the left lower lobe. Findings are nonspecific however may be secondary to an infectious or inflammatory process. Additionally there are new enlarged left hilar lymph nodes which are nonspecific however potentially secondary to the infectious or inflammatory process. Continued radiographic follow-up is recommended to ensure resolution. 3. Suggestion of increased opacification within the right upper lobe which may be secondary to radiation pneumonitis or potentially an infectious/inflammatory process. Metastatic disease not excluded. Recommend attention on followup. 4. Unchanged collapse/consolidation within the right middle and lower lobes with right hemidiaphragm elevation.     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     Objective  Filed Vitals:   03/17/15 0000 03/17/15 0333 03/17/15 0800 03/17/15 0913  BP:  104/59 103/76   Pulse:      Temp:  97.5 F (36.4 C)    TempSrc:  Oral    Resp: 22 23    Height:      Weight:  74.844 kg (165 lb)    SpO2: 92% 95%  94%    Intake/Output Summary (Last 24 hours) at 03/17/15 0925 Last data filed at 03/17/15 0333  Gross per 24 hour  Intake    220 ml  Output    350 ml  Net   -130 ml   Filed Weights   03/15/15 0410 03/16/15 0445 03/17/15 0333  Weight: 80.5 kg (177 lb 7.5 oz) 78.109 kg (172 lb 3.2 oz) 74.844 kg (165 lb)   Exam:  General:  NAD, looks comfortable  HEENT: no scleral icterus, PERRL  Cardiovascular: RRR without MRG, 2+ peripheral pulses, trace LE edema  Respiratory: shallow breathing, no wheezing, decreased sounds at the bases  Abdomen: soft, non tender, BS +, no guarding  MSK/Extremities: no clubbing/cyanosis, no joint  swelling  Skin: no rashes  Neuro: non focal  Data Reviewed: Basic Metabolic Panel:  Recent Labs Lab 03/11/15 0542 03/12/15 0342 03/13/15 0657 03/14/15 0600 03/14/15 1250 03/15/15 0415 03/16/15 0335 03/17/15 0453  NA 140 139 142 139 139  --  138 140  K 3.1* 4.1 3.9 3.7 3.6  --  3.8 3.3*  CL 100* 96* 99* 98* 99*  --  97* 101  CO2 33* 32 36* 36* 34*  --  33* 31  GLUCOSE 117* 85 78 84 100*  --  90 84  BUN 7 9 7 12 10   --  12 11  CREATININE 0.88 0.91 0.85 0.91 0.83  --  0.95 0.82  CALCIUM 8.0* 8.7* 8.9 8.7* 9.1  --  9.2 8.8*  MG 2.3 2.1 2.5* 2.3  --  2.2 2.3 2.2  PHOS 4.4 5.1*  --   --   --   --   --   --    CBC:  Recent Labs Lab 03/14/15 0600 03/15/15 0415 03/16/15 0335 03/17/15 0340 03/17/15 0453  WBC 5.7 6.0 7.3 3.9* 4.9  HGB 7.7* 7.9* 10.4* 7.3* 10.1*  HCT 24.7* 25.3* 31.7* 22.6* 31.7*  MCV 88.2 87.5 87.3 87.9 88.1  PLT 183 189 191 132* 198   BNP (last 3 results)  Recent Labs  03/05/15 1851  BNP 48.8   Scheduled Meds: . acidophilus  2 capsule Oral BID  . albuterol  2.5 mg Nebulization QID  .  antiseptic oral rinse  7 mL Mouth Rinse BID  . budesonide  0.5 mg Nebulization BID  . calcium carbonate  2 tablet Oral BID  . dronabinol  10 mg Oral TID AC  . enoxaparin (LOVENOX) injection  40 mg Subcutaneous Q24H  . feeding supplement (ENSURE ENLIVE)  237 mL Oral TID BM  . fentaNYL  50 mcg Transdermal Q72H  . guaiFENesin  1,200 mg Oral BID  . lactobacillus  1 g Oral TID WC  . lidocaine  15 mL Mouth/Throat Q4H  . methylphenidate  10 mg Oral BID  . multivitamin with minerals  1 tablet Oral Daily  . OLANZapine  5 mg Oral QHS  . pantoprazole  40 mg Oral Daily  . polyethylene glycol  17 g Oral Daily  . potassium chloride  40 mEq Oral Once  . senna-docusate  2 tablet Oral BID  . sodium chloride  10-40 mL Intracatheter Q12H  . Vitamin D (Ergocalciferol)  50,000 Units Oral Q Thu   Continuous Infusions:   Time spent: 25 minutes  Marzetta Board, MD [facility] Pager [phone]. If 7 PM - 7 AM, please contact night-coverage at [email or web address], password [facility] 03/17/2015, 9:25 AM  LOS: 14 days

## 2015-03-17 NOTE — Progress Notes (Signed)
Nutrition Follow-up  DOCUMENTATION CODES:  Not applicable  INTERVENTION:  -Resource Breeze po TID, each supplement provides 250 kcal and 9 grams of protein -D/c Ensure Enlive, per pt request -Strawberry Ice Cream TID between meals  -Provide chopped meats with additional gravy for ease of intake  NUTRITION DIAGNOSIS:  Inadequate oral intake related to other (see comment), mouth pain (thrush) as evidenced by meal completion < 25%.  Ongoing  GOAL:  Patient will meet greater than or equal to 90% of their needs  Progressing  MONITOR:  PO intake, Supplement acceptance, Labs, Weight trends, Skin, I & O's  REASON FOR ASSESSMENT:  Consult Assessment of nutrition requirement/status  ASSESSMENT: This is a 49 year old with breast cancer and history of bilateral sinus surgery with left cribriform CSF leak repaired with fat who presents with left frontoethmoid mucocele, chronic sinusitis. She now presents for revision bilateral sinus surgery and Draf III frontal sinusotomy. Dr. [name], [place] has discussed the risks (bleeding, infection, orbital injury/blindness, CSF leak, scarring, need for further surgery, etc.), benefits, and alternatives of this procedure. The patient understands the risks and would like to proceed with the procedure. The chances of success of the procedure are >50% and the patient understands this. I personally performed an examination of the patient within 24 hours of the procedure.  S/p Procedures on 03/03/15:  revision bilateral sinus surgery, Draf 3 frontal sinusotomy, and removal of left fronto-ethmoid mucocele  RD received consult to maximize PO intake. Spoke with RN who reports pt sister requested the consult due to poor po intake. RN reports pt is refusing Ensure supplements, but is consuming her own protein drink from home.  Spoke with pt and sister at bedside. Pt looks physically better and more interactive than initial visit. She reports that she wants  to be, but her largest barrier is mucositis. She has limited taste and reveals that what she is able to taste "tastes like sawdust". She reveals pain with eating, describing intake as "putting a jalapeno pepper on my tongue". Meal intake 50% per doc flowsheets.  Pt reports she is unable to eat a lot of food at one time due to mouth pain, but can tolerate melon, applesauce, ice cream, and soft meats and noodles with additional gravy. She is also consuming liquids and supplements without difficulty. She does not like the texture of pudding, complaining it is "too thick". She is using both lidocaine and magic mouth wash to help with mouth pain, but reports relief wears off when consuming food.   Educated pt and sister on high protein, high calorie diet guidelines (provided "High Calorie, High Protein Nutrition Therapy" handout from [employer]). Encouraged consuming small, frequent meals daily. Also discussed ways to maximize intake by choosing temperature neutral foods and altering texture of foods to promote limited chewing. Spent time with pt and sister reviewing alternate menu options that may be more appealing to pt.   Pt reports she prefers the [employer] Protein" supplement over the Ensure (each supplement has 160 kcals and 30 grams of protein- pt reports she consumes 2-3 daily). Pt shared that she also like the [employer]- RD to order.   Palliative care continues to follow. Plan is to continue aggressive care to improve oxygenation.  Height:  Ht Readings from Last 1 Encounters:  03/15/15 5\' 7"  (1.702 m)    Weight:  Wt Readings from Last 1 Encounters:  03/17/15 165 lb (74.844 kg)    Ideal Body Weight:  61.4 kg  Wt  Readings from Last 10 Encounters:  03/17/15 165 lb (74.844 kg)  02/25/15 166 lb (75.297 kg)  02/22/15 165 lb (74.844 kg)  02/12/15 164 lb (74.39 kg)  01/07/15 162 lb (73.483 kg)  12/17/14 164 lb (74.39 kg)  12/11/14 165 lb (74.844 kg)   11/26/14 164 lb 12.8 oz (74.753 kg)  11/19/14 165 lb (74.844 kg)  10/08/14 178 lb (80.74 kg)    BMI:  Body mass index is 25.84 kg/(m^2). Overweight  Estimated Nutritional Needs:  Kcal:  2050-2250  Protein:  95-105 grams  Fluid:  2.0-2.2 L  Skin:  Reviewed, no issues (closed bilateral nose incision)  Diet Order:  Diet regular Room service appropriate?: Yes; Fluid consistency:: Thin  EDUCATION NEEDS:  Education needs addressed   Intake/Output Summary (Last 24 hours) at 03/17/15 1716 Last data filed at 03/17/15 1400  Gross per 24 hour  Intake    700 ml  Output    350 ml  Net    350 ml    Last BM:  03/17/15  Estuardo Frisbee A. Jimmye Norman, RD, LDN, CDE Pager: [phone] After hours Pager: [phone]

## 2015-03-17 NOTE — Progress Notes (Signed)
Patient Bianca Kent      DOB: July 09, 1966      KPT:[id number]   Palliative Medicine Team at [facility] Progress Note    Subjective: Ambulating halls with PT today. Dyspnea feels a bit better.  Pain fairly well controlled. Moving bowels.  Appetite still poor.    Filed Vitals:   03/17/15 0800  BP: 103/76  Pulse:   Temp:   Resp:    Physical exam: GEN: alert. NAD HEENT: [place], sclera anicteric CV: RRR ABD: soft, ND EXT: warm  CBC    Component Value Date/Time   WBC 4.9 03/17/2015 0453   WBC 5.7 02/25/2015 1025   WBC 7.4 10/31/2013 0948   RBC 3.60* 03/17/2015 0453   RBC 5.04 02/25/2015 1025   RBC 4.79 10/31/2013 0948   HGB 10.1* 03/17/2015 0453   HGB 14.4 02/25/2015 1025   HGB 14.5 10/31/2013 0948   HCT 31.7* 03/17/2015 0453   HCT 42.7 02/25/2015 1025   HCT 46.0 10/31/2013 0948   PLT 198 03/17/2015 0453   PLT 142* 02/25/2015 1025   MCV 88.1 03/17/2015 0453   MCV 85 02/25/2015 1025   MCV 96.0 10/31/2013 0948   MCH 28.1 03/17/2015 0453   MCH 28.6 02/25/2015 1025   MCH 30.3 10/31/2013 0948   MCHC 31.9 03/17/2015 0453   MCHC 33.7 02/25/2015 1025   MCHC 31.5* 10/31/2013 0948   RDW 17.4* 03/17/2015 0453   RDW 18.7* 02/25/2015 1025   LYMPHSABS 1.1 02/25/2015 1025   LYMPHSABS 2.9 05/03/2009 0102   MONOABS 0.6 05/03/2009 0102   EOSABS 0.2 02/25/2015 1025   EOSABS 0.1 05/03/2009 0102   BASOSABS 0.0 02/25/2015 1025   BASOSABS 0.1 05/03/2009 0102    CMP     Component Value Date/Time   NA 140 03/17/2015 0453   NA 143 02/25/2015 1027   K 3.3* 03/17/2015 0453   K 4.2 02/25/2015 1027   CL 101 03/17/2015 0453   CL 104 02/25/2015 1027   CO2 31 03/17/2015 0453   CO2 28 02/25/2015 1027   GLUCOSE 84 03/17/2015 0453   GLUCOSE 81 02/25/2015 1027   BUN 11 03/17/2015 0453   BUN 8 02/25/2015 1027   CREATININE 0.82 03/17/2015 0453   CREATININE 0.8 02/25/2015 1027   CALCIUM 8.8* 03/17/2015 0453   CALCIUM 9.4 02/25/2015 1027   PROT 6.3* 03/06/2015 0425   PROT 8.7*  02/25/2015 1027   ALBUMIN 2.5* 03/06/2015 0425   AST 41 03/06/2015 0425   AST 45* 02/25/2015 1027   ALT 38 03/06/2015 0425   ALT 43 02/25/2015 1027   ALKPHOS 238* 03/06/2015 0425   ALKPHOS 189* 02/25/2015 1027   BILITOT 0.4 03/06/2015 0425   BILITOT 0.70 02/25/2015 1027   GFRNONAA >60 03/17/2015 0453   GFRAA >60 03/17/2015 0453      Assessment and plan: 49 yo female with metastatic breast ca admitted for elective sinus surgery complicated by post-op hypoxemia which has persisted in setting of extensive consolidation on chest CT and paralyzed right hemidiaphragm.   1. Code Status: partial  2. GOC: See initial consultation as well as documentation from Dr Hilma Favors.  Has shown some improvement over past few days and at this point, I think it makes sense to continue to see how she does.  Bianca Kent understands her situation and concerns expressed by her lungs, but she would like to continue hospital based care at this time if it is felt that she has chance for clinical improvement.  We spent some time today  exploring range of emotions she has felt through past several days in hospital. She was able to see her dog Loa Socks yesterday.  Her long term goal is to be able to be back home.    3. Symptom Management:  1. Chronic Pain: Can continue regimen. Feels largely controlled.  2. Dyspnea: improving with supportive care. 3. Appetite Loss- already on zyprexa, steroids, marinol. Doubt additional agents beneficial.  4. Psychosocial/Spiritual: Lives at home with her mom. Spiritual care to see. 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 No children. Bianca Kent Retriever named Loa Socks at home.   Total Time: 30 minutes >50% of time spent in counseling and coordination of care regarding above.  Doran Clay D.O. Palliative Medicine Team at [facility]  Pager: [phone] Team Phone: [phone]

## 2015-03-17 NOTE — Progress Notes (Signed)
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 Patient is s/p bilateral sinus surgery and Draf III sinusotomy w/ removal of fronto-ethmoid mucocele on 5/11 afterwhich patient developed hypoxia and acute respiroatory failure secondary to significant atelectasis.   OT comments  Pt requiring supervision for toilet transfers, one activity at the sink, and LB dressing using RW. She reports some mild visual blurriness only. Educated in energy conservation and provided handout to reinforce.  Pt reports performing theraband exercises on her own.  Follow Up Recommendations  Home health OT;Supervision/Assistance - 24 hour    Equipment Recommendations  3 in 1 bedside comode    Recommendations for Other Services      Precautions / Restrictions Precautions Precautions: Fall Precaution Comments: watch O2 sats       Mobility Bed Mobility                  Transfers Overall transfer level: Needs assistance Equipment used: Rolling walker (2 wheeled) Transfers: Sit to/from Stand Sit to Stand: Min guard         General transfer comment: supervision from recliner and toilet, uses UE assist    Balance     Sitting balance-Leahy Scale: Good       Standing balance-Leahy Scale: Fair                     ADL Overall ADL's : Needs assistance/impaired     Grooming: Supervision/safety;Standing               Lower Body Dressing: Supervision/safety;Sit to/from stand   Toilet Transfer: Supervision/safety;RW;Ambulation   Toileting- Water quality scientist and Hygiene: Supervision/safety;Sit to/from stand       Functional mobility during ADLs: Supervision/safety;Rolling walker General ADL Comments: Pt on 35% 02 blow by.  Pt educated in energy conservation, handout provided.  Pt has been faithful to her theraband HEP.      Vision                 Additional Comments: Pt report mild blurriness of vision, but stated today was the first day that the floor appeared level.   Perception     Praxis      Cognition   Behavior During Therapy: WFL for tasks assessed/performed Overall Cognitive Status: Within Functional Limits for tasks assessed                       Extremity/Trunk Assessment               Exercises     Shoulder Instructions       General Comments      Pertinent Vitals/ Pain       Pain Assessment: No/denies pain  Home Living                                          Prior Functioning/Environment              Frequency Min 3X/week     Progress Toward Goals  OT Goals(current goals can now be found in the care plan section)  Progress towards OT goals: Progressing toward goals  Acute Rehab OT Goals Patient Stated  Goal: to go home Time For Goal Achievement: 03/25/15 Potential to Achieve Goals: Good  Plan Discharge plan remains appropriate    Co-evaluation                 End of Session Equipment Utilized During Treatment: Oxygen   Activity Tolerance Patient tolerated treatment well   Patient Left in chair;with call bell/phone within reach;with family/visitor present   Nurse Communication          Time: 1700-1749 OT Time Calculation (min): 22 min  Charges: OT General Charges $OT Visit: 1 Procedure OT Treatments $Self Care/Home Management : 8-22 mins  Malka So 03/17/2015, 4:02 PM  [phone]

## 2015-03-17 NOTE — Progress Notes (Signed)
Name: Bianca Kent MRN: [id number] DOB: [birth year]    ADMISSION DATE:  03/03/2015 CONSULTATION DATE:  5/14  REFERRING MD :  Erlinda Hong  CHIEF COMPLAINT: hypoxia/SIRS (possible sepsis)  BRIEF PATIENT DESCRIPTION:  49 year old female w/ sig history of metastatic breast cancer w/ mets to brain (followed by [facility]). Last partial dose chemo 5/5, last xrt to left temporal area Feb 2016. Had remote h/o sinus surgery from which she had CSF leak w/ prior repair. 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 Surgery was uneventful but she was kept in the hospital over night as she had post-op sats in low 90s and needed pain management. Developed fever and had asymptomatic hypoxia w/ room air sats 70s on 5/13. Given IVFs, ABX widened. PCCM asked to see the following day for hypoxia and concern for sepsis. Found to have ATX thought due to suspected OSA, nasal packing, and possibly pain medications.   SIGNIFICANT EVENTS  5/11: bilateral sinus surgery and Draf III sinusotomy w/ removal of fronto-ethmoid mucocele  STUDIES:  Upper extremity doppler 5/15> No evidence upper extremity DVT Echo 9/67 >> nml LV systolic fxn, EF 89-38%, no RWMA, PA wnl 5/24 bubble study normal  SUBJECTIVE: feels OK, has been walking around the unit   VITAL SIGNS: Temp:  [97.5 F (36.4 C)-98.4 F (36.9 C)] 97.5 F (36.4 C) (05/25 0333) Resp:  [17-26] 23 (05/25 0333) BP: (99-116)/(57-76) 103/76 mmHg (05/25 0800) SpO2:  [92 %-96 %] 94 % (05/25 0913) FiO2 (%):  [40 %-60 %] 40 % (05/25 0913) Weight:  [165 lb (74.844 kg)] 165 lb (74.844 kg) (05/25 0333)  PHYSICAL EXAMINATION: General:  Comfortable in chair HENT: NCAT OP clear  PULM diminished R base, CTA B otherwise CV: RRR, no mgr GI: BS+, soft, nontender MSK: normal bulk and tone Neuro: A&Ox4, maew     Recent Labs Lab 03/14/15 1250 03/16/15 0335  03/17/15 0453  NA 139 138 140  K 3.6 3.8 3.3*  CL 99* 97* 101  CO2 34* 33* 31  BUN 10 12 11   CREATININE 0.83 0.95 0.82  GLUCOSE 100* 90 84    Recent Labs Lab 03/16/15 0335 03/17/15 0340 03/17/15 0453  HGB 10.4* 7.3* 10.1*  HCT 31.7* 22.6* 31.7*  WBC 7.3 3.9* 4.9  PLT 191 132* 198   ABG    Component Value Date/Time   PHART 7.421 03/12/2015 1658   PCO2ART 54.2* 03/12/2015 1658   PO2ART 71.0* 03/12/2015 1658   HCO3 35.3* 03/12/2015 1658   TCO2 37 03/12/2015 1658   ACIDBASEDEF 1.0 08/11/2007 2259   O2SAT 94.0 03/12/2015 1658   CXR 2 view 5/25 images reviewed> R basilar atlectasis about the same, improved aeration overall   ASSESSMENT / PLAN:  Acute respiratory failure with severe hypoxemia secondary to significant atelectasis and diaphragm paralysis in setting of resolving HCAP >  improved 5/25 5/24 bubble study normal  HCAP - treated   Plan: -wean O2 as able for O2 saturation > 90%  -continue Vibravest bid -continue IPPV-type device for albuterol QID -ambulate -to chair -continue pulmicort to 0.5mg  bid -repeat CXR 2 view 5/27 AM -if O2 not improved by 5/27 then start CPAP, but I think she is improving well enough without it that it may not be needed from an oxygenation standpoint  OSA  Plan:  - Minimizing sedating meds -discussed situation with Dr. 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  Plan to continue aggressive measures in the hospital to try to improve oxygenation   Roselie Awkward, MD [place] [facility] Pager: [phone] Cell: [phone] If no response, call [phone]

## 2015-03-17 NOTE — Progress Notes (Signed)
Physical Therapy Treatment Patient Details Name: Bianca Kent MRN: [id number] DOB: Jun 16, 1966 Today's Date: 03/17/2015    History of Present Illness Patient is a 49 yo female with complex medical history significant for metastatic breast cancer with mets to the brain undergoing chemotherapy. Patient is s/p bilateral sinus surgery and Draf III sinusotomy w/ removal of fronto-ethmoid mucocele on 5/11 afterwhich patient developed hypoxia and acute respiroatory failure secondary to significant atelectasis.    PT Comments    Pt progressing with mobility, ambulated 250' with RW and min-guard, fatigue with second half of ambulation with one mild LOB, VSS throughout. PT will continue to follow.   Follow Up Recommendations  Home health PT;Supervision/Assistance - 24 hour;Supervision for mobility/OOB     Equipment Recommendations  Rolling walker with 5" wheels    Recommendations for Other Services       Precautions / Restrictions Precautions Precautions: Fall Precaution Comments: watch O2 sats Restrictions Weight Bearing Restrictions: No    Mobility  Bed Mobility Overal bed mobility: Needs Assistance Bed Mobility: Supine to Sit     Supine to sit: Supervision     General bed mobility comments: pt able to get to EOB without physical assist, approaching mod I  Transfers Overall transfer level: Needs assistance Equipment used: Rolling walker (2 wheeled) Transfers: Sit to/from Stand Sit to Stand: Min guard         General transfer comment: min-guard for initial stand, supervision for subsequent stands  Ambulation/Gait Ambulation/Gait assistance: Min guard;+2 safety/equipment Ambulation Distance (Feet): 250 Feet Assistive device: Rolling walker (2 wheeled) Gait Pattern/deviations: Step-through pattern;Decreased stride length Gait velocity: between 1.8 and 2.62 ft/ sec Gait velocity interpretation: Below normal speed for age/gender General Gait Details: Pt began to  fatigue at 125' and frequent standing rests taken after that. one mild posterior LOB with self correction and diminshed knee control with increasing distance but no full buckling. Pt ready to get home and want to increase her mobilty but advised to do this slowly, increasing in small increments as tolerated. O2 sats 89-97%, HR 84 bpm   Stairs            Wheelchair Mobility    Modified Rankin (Stroke Patients Only)       Balance Overall balance assessment: Needs assistance Sitting-balance support: No upper extremity supported;Feet supported Sitting balance-Leahy Scale: Good     Standing balance support: No upper extremity supported;During functional activity Standing balance-Leahy Scale: Fair Standing balance comment: fair with static standing and can perform some dynamic activity without UE support when not fatigued                    Cognition Arousal/Alertness: Awake/alert Behavior During Therapy: WFL for tasks assessed/performed Overall Cognitive Status: Within Functional Limits for tasks assessed                      Exercises General Exercises - Lower Extremity Hip Flexion/Marching: 20 reps;Standing (educated to do when transferring) Mini-Sqauts: 10 reps;Standing (educated to perform through day with transfers)    General Comments        Pertinent Vitals/Pain Pain Assessment: No/denies pain    Home Living                      Prior Function            PT Goals (current goals can now be found in the care plan section) Acute Rehab PT Goals Patient Stated Goal: to go  home PT Goal Formulation: With patient Time For Goal Achievement: 03/23/15 Potential to Achieve Goals: Good Progress towards PT goals: Progressing toward goals    Frequency  Min 3X/week    PT Plan Current plan remains appropriate    Co-evaluation             End of Session Equipment Utilized During Treatment: Gait belt;Oxygen Activity Tolerance: Patient  tolerated treatment well Patient left: in chair;with call bell/phone within reach;with family/visitor present     Time: 8144-8185 PT Time Calculation (min) (ACUTE ONLY): 24 min  Charges:  $Gait Training: 23-37 mins                    G Codes:     Leighton Roach, PT  Acute Rehab Services  [place], [place] 03/17/2015, 10:40 AM

## 2015-03-17 NOTE — Care Management Note (Signed)
Case Management Note  Patient Details  Name: Bianca Kent MRN: [id number] Date of Birth: 1965/11/03  Subjective/Objective:                    Action/Plan:   Expected Discharge Date:                  Expected Discharge Plan:  Home w Hospice Care  In-House Referral:     Discharge planning Services  CM Consult  Post Acute Care Choice:  Hospice Choice offered to:  Patient  DME Arranged:  3-N-1, Hospital bed, Oxygen, Overbed table, Walker rolling DME Agency:  [place]:  RN [facility] Agency:  [place]  Status of Service:  Completed, signed off  Medicare Important Message Given:    Date Medicare IM Given:    Medicare IM give by:    Date Additional Medicare IM Given:    Additional Medicare Important Message give by:     If discussed at [place] of Stay Meetings, dates discussed:  03/18/15  Additional Comments:  Lacretia Leigh, RN 03/17/2015, 10:17 AM

## 2015-03-17 NOTE — Progress Notes (Signed)
Pt wants to sleep and only do CPT PRN

## 2015-03-18 ENCOUNTER — Inpatient Hospital Stay (HOSPITAL_COMMUNITY): Payer: 59

## 2015-03-18 ENCOUNTER — Other Ambulatory Visit: Payer: 59

## 2015-03-18 ENCOUNTER — Ambulatory Visit: Payer: 59 | Admitting: Hematology & Oncology

## 2015-03-18 ENCOUNTER — Ambulatory Visit: Payer: 59

## 2015-03-18 DIAGNOSIS — R109 Unspecified abdominal pain: Secondary | ICD-10-CM | POA: Insufficient documentation

## 2015-03-18 DIAGNOSIS — C50911 Malignant neoplasm of unspecified site of right female breast: Secondary | ICD-10-CM

## 2015-03-18 LAB — URINALYSIS, ROUTINE W REFLEX MICROSCOPIC
Bilirubin Urine: NEGATIVE
Glucose, UA: NEGATIVE mg/dL
Hgb urine dipstick: NEGATIVE
KETONES UR: NEGATIVE mg/dL
LEUKOCYTES UA: NEGATIVE
Nitrite: NEGATIVE
PH: 8 (ref 5.0–8.0)
Protein, ur: NEGATIVE mg/dL
Specific Gravity, Urine: 1.012 (ref 1.005–1.030)
UROBILINOGEN UA: 0.2 mg/dL (ref 0.0–1.0)

## 2015-03-18 LAB — BASIC METABOLIC PANEL
ANION GAP: 8 (ref 5–15)
BUN: 10 mg/dL (ref 6–20)
CHLORIDE: 101 mmol/L (ref 101–111)
CO2: 29 mmol/L (ref 22–32)
Calcium: 8.7 mg/dL — ABNORMAL LOW (ref 8.9–10.3)
Creatinine, Ser: 0.79 mg/dL (ref 0.44–1.00)
GFR calc Af Amer: >60 mL/min (ref >60)
GFR calc non Af Amer: >60 mL/min (ref >60)
GLUCOSE: 92 mg/dL (ref 65–99)
Potassium: 3.7 mmol/L (ref 3.5–5.1)
Sodium: 138 mmol/L (ref 135–145)

## 2015-03-18 LAB — CBC
HEMATOCRIT: 31.5 % — AB (ref 36.0–46.0)
Hemoglobin: 10 g/dL — ABNORMAL LOW (ref 12.0–15.0)
MCH: 27.9 pg (ref 26.0–34.0)
MCHC: 31.7 g/dL (ref 30.0–36.0)
MCV: 87.7 fL (ref 78.0–100.0)
Platelets: 201 10*3/uL (ref 150–400)
RBC: 3.59 MIL/uL — AB (ref 3.87–5.11)
RDW: 17.4 % — ABNORMAL HIGH (ref 11.5–15.5)
WBC: 4.8 10*3/uL (ref 4.0–10.5)

## 2015-03-18 LAB — MAGNESIUM: Magnesium: 2.1 mg/dL (ref 1.7–2.4)

## 2015-03-18 LAB — IRON AND TIBC
IRON: 35 ug/dL (ref 28–170)
Saturation Ratios: 15 % (ref 10.4–31.8)
TIBC: 234 ug/dL — ABNORMAL LOW (ref 250–450)
UIBC: 199 ug/dL

## 2015-03-18 LAB — FERRITIN: Ferritin: 125 ng/mL (ref 11–307)

## 2015-03-18 MED ORDER — SODIUM BICARBONATE/SODIUM CHLORIDE MOUTHWASH
OROMUCOSAL | Status: DC
Start: 2015-03-18 — End: 2015-03-22
  Administered 2015-03-18: 21:00:00 via OROMUCOSAL
  Administered 2015-03-18: 1 via OROMUCOSAL
  Administered 2015-03-18 – 2015-03-22 (×15): via OROMUCOSAL
  Filled 2015-03-18 (×2): qty 1000

## 2015-03-18 MED ORDER — BUDESONIDE 0.5 MG/2ML IN SUSP
0.5000 mg | Freq: Two times a day (BID) | RESPIRATORY_TRACT | Status: DC
  Administered 2015-03-18 – 2015-03-22 (×8): 0.5 mg via RESPIRATORY_TRACT
  Filled 2015-03-18 (×11): qty 2

## 2015-03-18 MED ORDER — SIMETHICONE 80 MG PO CHEW
80.0000 mg | CHEWABLE_TABLET | Freq: Four times a day (QID) | ORAL | Status: DC | PRN
  Filled 2015-03-18: qty 1

## 2015-03-18 NOTE — Progress Notes (Addendum)
Name: Bianca Kent MRN: [id number] DOB: 1966/01/24    ADMISSION DATE:  03/03/2015 CONSULTATION DATE:  5/14  REFERRING MD :  Erlinda Hong  CHIEF COMPLAINT: hypoxia/SIRS (possible sepsis)  BRIEF PATIENT DESCRIPTION:  49 year old female w/ sig history of metastatic breast cancer w/ mets to brain (followed by [facility]). Last partial dose chemo 5/5, last xrt to left temporal area Feb 2016. Had remote h/o sinus surgery from which she had CSF leak w/ prior repair. 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 Surgery was uneventful but she was kept in the hospital over night as she had post-op sats in low 90s and needed pain management. Developed fever and had asymptomatic hypoxia w/ room air sats 70s on 5/13. Given IVFs, ABX widened. PCCM asked to see the following day for hypoxia and concern for sepsis. Found to have ATX thought due to suspected OSA, nasal packing, and possibly pain medications.   SIGNIFICANT EVENTS  5/11: bilateral sinus surgery and Draf III sinusotomy w/ removal of fronto-ethmoid mucocele  STUDIES:  Upper extremity doppler 5/15> No evidence upper extremity DVT Echo 5/46 >> nml LV systolic fxn, EF 27-03%, no RWMA, PA wnl 5/24 bubble study normal  SUBJECTIVE: left sided cramping today   VITAL SIGNS: Temp:  [97.4 F (36.3 C)-98.4 F (36.9 C)] 98.3 F (36.8 C) (05/26 0731) Pulse Rate:  [78] 78 (05/26 0855) Resp:  [22-29] 22 (05/26 0855) BP: (98-116)/(58-70) 100/61 mmHg (05/26 0731) SpO2:  [92 %-97 %] 97 % (05/26 0858) FiO2 (%):  [35 %] 35 % (05/26 0855) Weight:  [75.297 kg (166 lb)] 75.297 kg (166 lb) (05/26 0300)  PHYSICAL EXAMINATION: General:  Comfortable in chair HENT: NCAT OP clear  PULM diminished R base, Crackles left base CV: RRR, no mgr GI: BS+, soft, nontender MSK: normal bulk and tone Neuro: A&Ox4, maew     Recent Labs Lab 03/16/15 0335  03/17/15 0453 03/18/15 0320  NA 138 140 138  K 3.8 3.3* 3.7  CL 97* 101 101  CO2 33* 31 29  BUN 12 11 10   CREATININE 0.95 0.82 0.79  GLUCOSE 90 84 92    Recent Labs Lab 03/17/15 0340 03/17/15 0453 03/18/15 0320  HGB 7.3* 10.1* 10.0*  HCT 22.6* 31.7* 31.5*  WBC 3.9* 4.9 4.8  PLT 132* 198 201   ABG    Component Value Date/Time   PHART 7.421 03/12/2015 1658   PCO2ART 54.2* 03/12/2015 1658   PO2ART 71.0* 03/12/2015 1658   HCO3 35.3* 03/12/2015 1658   TCO2 37 03/12/2015 1658   ACIDBASEDEF 1.0 08/11/2007 2259   O2SAT 94.0 03/12/2015 1658   CXR 2 view 5/26 images reviewed> R basilar atlectasis about the same, areation stable, some gas loops [facility] Oncology notes reviewed, checking iron studies, ordered CXR  ASSESSMENT / PLAN:  Acute respiratory failure with severe hypoxemia secondary to significant atelectasis and diaphragm paralysis in setting of resolving HCAP >  improved 5/26 5/24 bubble study normal  HCAP - treated   Plan: -wean O2 as able for O2 saturation > 90%  -switch to nasal cannula today -Vibravest to prn -continue IPPV-type device for albuterol QID -ambulate -to chair -continue pulmicort to 0.5mg  bid  Left flank pain> exam unremarkable, suspect gas given small bowel loops seen under left diaphragm on this mornings' CXR - add simethicone - ambulate - check U/A  OSA - resume CPAP next week  Plan:  -  Minimizing sedating meds -discussed situation with Dr. 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    Roselie Awkward, MD [place] [facility] Pager: [phone] Cell: [phone] If no response, call [phone]

## 2015-03-18 NOTE — Progress Notes (Signed)
PROGRESS NOTE  KENYATTA GLOECKNER VXB:[id number] DOB: [birth year] DOA: 03/03/2015 PCP: [name], MD  HPI: 49 year old female with a history of metastatic breast cancer to the brain (Dr. [name]), asthma, depression, GERD, was admitted on 03/03/2015 for elective outpatient sinus surgery. The patient underwent a left frontal ethmoid mucocele drainage performed by Dr. [name]. During the surgery, the patient also had her anterior Reform CSF leak from the mucocele repaired with cautery. Postoperatively, the patient has some hypoxemia, and therefore she was kept overnight. On postoperative day #2, the patient's oxygen saturations have somewhat improved and the patient was about to be discharged home. Unfortunately, the patient began developing fever and subsequently was found to have hypoxemia with oxygen saturations in the mid 70s on room air.   She was started on broad spectrum antibiotics as there was high suspicion for aspiration pneumonia and Pulmonary was consulted. Patient has been persistently hypoxemic and required transfer to SDU on 5/20 for increased oxygen requirements. She has finished 9 days of Vancomycin and Zosyn for presumed HCAP.   Subjective / 24 H Interval events -Patient feels that she continues she showed daily improvement, tolerating by mouth intake, and I fevers or chills.  Assessment/Plan: Active Problems:   Post-operative state   Acute respiratory failure with hypoxia   Postprocedural fever   Breast cancer, stage 4   Thrombocytopenia   Confusion   Hypoxemia   HCAP (healthcare-associated pneumonia)   Acute respiratory failure   PNA (pneumonia)   Dyspnea and respiratory abnormality   Appetite loss   Chronic pain syndrome   Paralysis of diaphragm nerve   Atelectasis   Palliative care encounter   Flank pain   Acute hypoxic and hypercarbic respiratory failure -Likely secondary to healthcare associated pneumonia with atelectasis and diaphragmatic paralysis  contributing as well. - CT scan 5/20 showed right middle/lower lobes collapse, bilateral opacifications.  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  -Two-view chest x-ray performed performed on 03/17/2015  showing bibasilar changes with overall level of inspiration improving significantly since prior exam -finished Vancomycin / Zosyn for presumed PNA 5/13 >> 5/21 for 9 days -Showing clinical improvement    Hypotension - blood pressures much better, having last blood pressure 118/66.   Hypokalemia - potassium improved to 3.7 after receiving supplementation yesterday   Oral thrush and odynophagia,  - improving  Thrombocytopenia / Anemia - from chemo vs infection vs consumption.  - platelets now normal - s/p 2U pRBC 5/23  Metastatic breast cancer with brain mets - Reported has not been getting full dose chemo due to recent illness, followed by Dr. [name]. - discussed about her care with Dr. [name] 5/21  S/p bilateral sinus surgery and Draf III frontal sinusotomy 03/03/2015. - Per ENT  Constipation:  - stool softener   Diet: Diet regular Room service appropriate?: Yes; Fluid consistency:: Thin Fluids: none  DVT Prophylaxis: lovenox on 5/15 stopped due to worsening of thrombocytopenia. Resumed 5/18   Code Status: Partial Code Family Communication: d/w sisters bedside  Disposition Plan: patient showing improvement will transfer to [place]  Consultants:  PCCM  Palliative   medical oncology  Procedures:  bilateral sinus surgery and Draf III frontal sinusotomy 03/03/2015   Antibiotics Vancomycin 5/13 >> 5/21 Zosyn 5/13 >> 5/21   Studies  Ct Angio Chest Pe W/cm &/or Wo Cm 03/12/2015 1. No definite evidence for pulmonary embolism. Evaluation of the distal segmental and subsegmental pulmonary arteries limited due to motion artifact. 2. Interval development of extensive pulmonary consolidation throughout  left lung particularly involving the left lower lobe.  Findings are nonspecific however may be secondary to an infectious or inflammatory process. Additionally there are new enlarged left hilar lymph nodes which are nonspecific however potentially secondary to the infectious or inflammatory process. Continued radiographic follow-up is recommended to ensure resolution. 3. Suggestion of increased opacification within the right upper lobe which may be secondary to radiation pneumonitis or potentially an infectious/inflammatory process. Metastatic disease not excluded. Recommend attention on followup. 4. Unchanged collapse/consolidation within the right middle and lower lobes with right hemidiaphragm elevation.     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     Objective  Filed Vitals:   03/18/15 0731 03/18/15 0855 03/18/15 0858 03/18/15 1234  BP: 100/61   118/66  Pulse:  78    Temp: 98.3 F (36.8 C)   98 F (36.7 C)  TempSrc: Oral   Oral  Resp: 29 22  34  Height:      Weight:      SpO2: 92% 94% 97% 91%    Intake/Output Summary (Last 24 hours) at 03/18/15 1303 Last data filed at 03/17/15 1400  Gross per 24 hour  Intake    240 ml  Output      0 ml  Net    240 ml   Filed Weights   03/16/15 0445 03/17/15 0333 03/18/15 0300  Weight: 78.109 kg (172 lb 3.2 oz) 74.844 kg (165 lb) 75.297 kg (166 lb)   Exam:  General:  NAD, looks comfortable  HEENT: no scleral icterus, PERRL  Cardiovascular: RRR without MRG, 2+ peripheral pulses, trace LE edema  Respiratory: shallow breathing, no wheezing, decreased sounds at the bases  Abdomen: soft, non tender, BS +, no guarding  MSK/Extremities: no clubbing/cyanosis, no joint swelling  Skin: no rashes  Neuro: non focal  Data Reviewed: Basic Metabolic Panel:  Recent Labs Lab 03/12/15 0342  03/14/15 0600 03/14/15 1250 03/15/15 0415 03/16/15 0335 03/17/15 0453 03/18/15 0320  NA 139  < > 139 139  --  138 140 138  K 4.1  < > 3.7 3.6  --  3.8  3.3* 3.7  CL 96*  < > 98* 99*  --  97* 101 101  CO2 32  < > 36* 34*  --  33* 31 29  GLUCOSE 85  < > 84 100*  --  90 84 92  BUN 9  < > 12 10  --  12 11 10   CREATININE 0.91  < > 0.91 0.83  --  0.95 0.82 0.79  CALCIUM 8.7*  < > 8.7* 9.1  --  9.2 8.8* 8.7*  MG 2.1  < > 2.3  --  2.2 2.3 2.2 2.1  PHOS 5.1*  --   --   --   --   --   --   --   < > = values in this interval not displayed. CBC:  Recent Labs Lab 03/15/15 0415 03/16/15 0335 03/17/15 0340 03/17/15 0453 03/18/15 0320  WBC 6.0 7.3 3.9* 4.9 4.8  HGB 7.9* 10.4* 7.3* 10.1* 10.0*  HCT 25.3* 31.7* 22.6* 31.7* 31.5*  MCV 87.5 87.3 87.9 88.1 87.7  PLT 189 191 132* 198 201   BNP (last 3 results)  Recent Labs  03/05/15 1851  BNP 48.8   Scheduled Meds: . acidophilus  2 capsule Oral BID  . albuterol  2.5 mg Nebulization QID  . antiseptic oral rinse  7 mL Mouth Rinse BID  .  budesonide (PULMICORT) nebulizer solution  0.5 mg Nebulization BID  . calcium carbonate  2 tablet Oral BID  . dronabinol  10 mg Oral TID AC  . enoxaparin (LOVENOX) injection  40 mg Subcutaneous Q24H  . feeding supplement (RESOURCE BREEZE)  1 Container Oral TID BM  . fentaNYL  50 mcg Transdermal Q72H  . guaiFENesin  1,200 mg Oral BID  . lactobacillus  1 g Oral TID WC  . lidocaine  15 mL Mouth/Throat Q4H  . methylphenidate  10 mg Oral BID  . multivitamin with minerals  1 tablet Oral Daily  . OLANZapine  5 mg Oral QHS  . pantoprazole  40 mg Oral Daily  . polyethylene glycol  17 g Oral Daily  . senna-docusate  2 tablet Oral BID  . sodium bicarbonate/sodium chloride   Mouth Rinse Q4H  . sodium chloride  10-40 mL Intracatheter Q12H  . Vitamin D (Ergocalciferol)  50,000 Units Oral Q Thu   Continuous Infusions:   Time spent: 25 minutes  Marzetta Board, MD [facility] Pager [phone]. If 7 PM - 7 AM, please contact night-coverage at [email or web address], password [facility] 03/18/2015, 1:03 PM  LOS: 15 days

## 2015-03-18 NOTE — Progress Notes (Signed)
Saw [name] this morning. Continues to slowly feel better. Having some cramps along left side.  Will continue to follow along with her care.    Doran Clay D.O. Palliative Medicine Team at [facility]  Pager: [phone] Team Phone: [phone]

## 2015-03-18 NOTE — Progress Notes (Signed)
Ms. Figge is starting to finally look like her old self. She just looks better. She has better "color". The 2 units of blood that we gave her didn't seem to help. Patient was walking yesterday. Her oxygen level and requirements are less.  She is eating a little bit better. She said her tongue is still on the sore side.  She had a bubble test done yesterday. This was negative.  Her labs show that her hemoglobin is 10.0. I'm checking some iron studies on her.  I think that another chest x-ray would be helpful.  I appreciate all the great care that she is getting in the attention that she is being given to try to improve her performance status area  Her pains he's been doing fairly well right now.  On her physical exam, her vital signs are stable. Her blood pressure is 100/61. Oxygen saturation was 93%. Temperature 98.3. Her lungs sounded relatively clear bilaterally. There may been some wheezes. Cardiac exam regular rate and rhythm. Abdomen is soft. Extremities shows no clubbing, cyanosis or edema.  I'm glad to see that she is starting to improve. This has been a struggle. We have had great prayer sessions with each meeting. Her faith has been strong.  Maybe, she can even be moved out of the cardiac care unit today since she seems to be doing well. [place] 46:1

## 2015-03-19 ENCOUNTER — Inpatient Hospital Stay (HOSPITAL_COMMUNITY): Payer: 59

## 2015-03-19 DIAGNOSIS — R0782 Intercostal pain: Secondary | ICD-10-CM | POA: Insufficient documentation

## 2015-03-19 LAB — BASIC METABOLIC PANEL
Anion gap: 8 (ref 5–15)
BUN: 7 mg/dL (ref 6–20)
CALCIUM: 8.7 mg/dL — AB (ref 8.9–10.3)
CHLORIDE: 101 mmol/L (ref 101–111)
CO2: 29 mmol/L (ref 22–32)
CREATININE: 0.75 mg/dL (ref 0.44–1.00)
GFR calc Af Amer: >60 mL/min (ref >60)
GFR calc non Af Amer: >60 mL/min (ref >60)
Glucose, Bld: 95 mg/dL (ref 65–99)
POTASSIUM: 3.8 mmol/L (ref 3.5–5.1)
Sodium: 138 mmol/L (ref 135–145)

## 2015-03-19 LAB — CBC
HCT: 33.8 % — ABNORMAL LOW (ref 36.0–46.0)
Hemoglobin: 11 g/dL — ABNORMAL LOW (ref 12.0–15.0)
MCH: 28.6 pg (ref 26.0–34.0)
MCHC: 32.5 g/dL (ref 30.0–36.0)
MCV: 88 fL (ref 78.0–100.0)
Platelets: 203 10*3/uL (ref 150–400)
RBC: 3.84 MIL/uL — ABNORMAL LOW (ref 3.87–5.11)
RDW: 17.3 % — ABNORMAL HIGH (ref 11.5–15.5)
WBC: 4.9 10*3/uL (ref 4.0–10.5)

## 2015-03-19 LAB — MAGNESIUM: Magnesium: 2.1 mg/dL (ref 1.7–2.4)

## 2015-03-19 MED ORDER — OXYCODONE HCL 5 MG PO TABS
10.0000 mg | ORAL_TABLET | ORAL | Status: DC | PRN
  Administered 2015-03-19 – 2015-03-21 (×7): 15 mg via ORAL
  Administered 2015-03-21: 10 mg via ORAL
  Administered 2015-03-21: 5 mg via ORAL
  Administered 2015-03-21 – 2015-03-22 (×3): 15 mg via ORAL
  Filled 2015-03-19 (×11): qty 3

## 2015-03-19 NOTE — Progress Notes (Signed)
PROGRESS NOTE  ROBYN GALATI WEX:[id number] DOB: 01/19/1966 DOA: 03/03/2015 PCP: [name], MD  HPI: 49 year old female with a history of metastatic breast cancer to the brain (Dr. [name]), asthma, depression, GERD, was admitted on 03/03/2015 for elective outpatient sinus surgery. The patient underwent a left frontal ethmoid mucocele drainage performed by Dr. [name]. During the surgery, the patient also had her anterior Reform CSF leak from the mucocele repaired with cautery. Postoperatively, the patient has some hypoxemia, and therefore she was kept overnight. On postoperative day #2, the patient's oxygen saturations have somewhat improved and the patient was about to be discharged home. Unfortunately, the patient began developing fever and subsequently was found to have hypoxemia with oxygen saturations in the mid 70s on room air.   She was started on broad spectrum antibiotics as there was high suspicion for aspiration pneumonia and Pulmonary was consulted. Patient has been persistently hypoxemic and required transfer to SDU on 5/20 for increased oxygen requirements. She has finished 9 days of Vancomycin and Zosyn for presumed HCAP.   Subjective / 24 H Interval events -Patient reporting left chest wall pain that started yesterday and has been getting progressively worse. She describes her pain as sharp stabbing, that is worse with deep inspiration and movement/light touch over the area.   Assessment/Plan: Active Problems:   Post-operative state   Acute respiratory failure with hypoxia   Postprocedural fever   Breast cancer, stage 4   Thrombocytopenia   Confusion   Hypoxemia   HCAP (healthcare-associated pneumonia)   Acute respiratory failure   PNA (pneumonia)   Dyspnea and respiratory abnormality   Appetite loss   Chronic pain syndrome   Paralysis of diaphragm nerve   Atelectasis   Palliative care encounter   Flank pain   Acute hypoxic and hypercarbic respiratory  failure -Likely secondary to healthcare associated pneumonia with atelectasis and diaphragmatic paralysis contributing as well. - CT scan 5/20 showed right middle/lower lobes collapse, bilateral opacifications.  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  -Two-view chest x-ray performed performed on 03/17/2015  showing bibasilar changes with overall level of inspiration improving significantly since prior exam -finished Vancomycin / Zosyn for presumed PNA 5/13 >> 5/21 for 9 days -Showing clinical improvement   Chest Pain -Patient complaining of worsening left-sided chest pain which appears musculoskeletal in origin as there was significant pain with palpation of left thoracic wall. She thinks it may be related to a "pulled muscle" that could have resulted from cough.  -Repeat CXR did not show acute changes.  -Palliative Care following, narcotic analgesics were titrated.   Hypotension -Her blood pressures are stable  Hypokalemia -AM labs showing potassium of 3.8   Oral thrush and odynophagia,  - improving  Thrombocytopenia / Anemia - from chemo vs infection vs consumption.  - platelets now normal - s/p 2U pRBC 5/23  Metastatic breast cancer with brain mets -Dr [name] following  S/p bilateral sinus surgery and Draf III frontal sinusotomy 03/03/2015. - Per ENT  Constipation:  - stool softener   Diet: Diet regular Room service appropriate?: Yes; Fluid consistency:: Thin Fluids: none  DVT Prophylaxis: lovenox on 5/15 stopped due to worsening of thrombocytopenia. Resumed 5/18   Code Status: Partial Code Family Communication: d/w sisters bedside  Disposition Plan: Transfer orders to med/surg placed  Consultants:  PCCM  Palliative   medical oncology  Procedures:  bilateral sinus surgery and Draf III frontal sinusotomy 03/03/2015   Antibiotics Vancomycin 5/13 >> 5/21 Zosyn 5/13 >> 5/21  Studies  Ct Angio Chest Pe W/cm &/or Wo Cm 03/12/2015 1. No definite  evidence for pulmonary embolism. Evaluation of the distal segmental and subsegmental pulmonary arteries limited due to motion artifact. 2. Interval development of extensive pulmonary consolidation throughout left lung particularly involving the left lower lobe. Findings are nonspecific however may be secondary to an infectious or inflammatory process. Additionally there are new enlarged left hilar lymph nodes which are nonspecific however potentially secondary to the infectious or inflammatory process. Continued radiographic follow-up is recommended to ensure resolution. 3. Suggestion of increased opacification within the right upper lobe which may be secondary to radiation pneumonitis or potentially an infectious/inflammatory process. Metastatic disease not excluded. Recommend attention on followup. 4. Unchanged collapse/consolidation within the right middle and lower lobes with right hemidiaphragm elevation.     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     Objective  Filed Vitals:   03/19/15 0300 03/19/15 0813 03/19/15 1154 03/19/15 1206  BP: 105/65 97/62  110/64  Pulse:      Temp: 97.6 F (36.4 C) 98.5 F (36.9 C)  98.3 F (36.8 C)  TempSrc: Oral Oral  Oral  Resp: 22 22  15   Height:      Weight:      SpO2: 95% 96% 95% 96%   No intake or output data in the 24 hours ending 03/19/15 1301 Filed Weights   03/16/15 0445 03/17/15 0333 03/18/15 0300  Weight: 78.109 kg (172 lb 3.2 oz) 74.844 kg (165 lb) 75.297 kg (166 lb)   Exam:  General:  Pt appears uncomfortable, reports left sided chest pain  HEENT: no scleral icterus, PERRL  Cardiovascular: RRR without MRG, 2+ peripheral pulses, trace LE edema  Respiratory: shallow breathing, no wheezing, decreased sounds at the bases  Abdomen: soft, non tender, BS +, no guarding  MSK/Extremities: She had pain with palpation to her left thoracic region, mostly region of mid axillary region extending  to her back, I did not see hematoma, erythema, swelling.   Skin: no rashes  Neuro: non focal  Data Reviewed: Basic Metabolic Panel:  Recent Labs Lab 03/14/15 1250 03/15/15 0415 03/16/15 0335 03/17/15 0453 03/18/15 0320 03/19/15 0312  NA 139  --  138 140 138 138  K 3.6  --  3.8 3.3* 3.7 3.8  CL 99*  --  97* 101 101 101  CO2 34*  --  33* 31 29 29   GLUCOSE 100*  --  90 84 92 95  BUN 10  --  12 11 10 7   CREATININE 0.83  --  0.95 0.82 0.79 0.75  CALCIUM 9.1  --  9.2 8.8* 8.7* 8.7*  MG  --  2.2 2.3 2.2 2.1 2.1   CBC:  Recent Labs Lab 03/16/15 0335 03/17/15 0340 03/17/15 0453 03/18/15 0320 03/19/15 0312  WBC 7.3 3.9* 4.9 4.8 4.9  HGB 10.4* 7.3* 10.1* 10.0* 11.0*  HCT 31.7* 22.6* 31.7* 31.5* 33.8*  MCV 87.3 87.9 88.1 87.7 88.0  PLT 191 132* 198 201 203   BNP (last 3 results)  Recent Labs  03/05/15 1851  BNP 48.8   Scheduled Meds: . acidophilus  2 capsule Oral BID  . albuterol  2.5 mg Nebulization QID  . antiseptic oral rinse  7 mL Mouth Rinse BID  . budesonide (PULMICORT) nebulizer solution  0.5 mg Nebulization BID  . calcium carbonate  2 tablet Oral BID  . dronabinol  10 mg Oral TID AC  . enoxaparin (LOVENOX) injection  40 mg Subcutaneous Q24H  . feeding supplement (RESOURCE BREEZE)  1 Container Oral TID BM  . fentaNYL  50 mcg Transdermal Q72H  . guaiFENesin  1,200 mg Oral BID  . lactobacillus  1 g Oral TID WC  . lidocaine  15 mL Mouth/Throat Q4H  . methylphenidate  10 mg Oral BID  . multivitamin with minerals  1 tablet Oral Daily  . OLANZapine  5 mg Oral QHS  . pantoprazole  40 mg Oral Daily  . polyethylene glycol  17 g Oral Daily  . senna-docusate  2 tablet Oral BID  . sodium bicarbonate/sodium chloride   Mouth Rinse Q4H  . sodium chloride  10-40 mL Intracatheter Q12H  . Vitamin D (Ergocalciferol)  50,000 Units Oral Q Thu   Continuous Infusions:   Time spent: 25 minutes  Marzetta Board, MD [facility] Pager [phone]. If 7 PM - 7 AM,  please contact night-coverage at [email or web address], password [facility] 03/19/2015, 1:01 PM  LOS: 16 days

## 2015-03-19 NOTE — Progress Notes (Signed)
Daily Progress Note   Patient Name: Bianca Kent       Date: 03/19/2015 DOB: 04-Dec-1965  Age: 49 y.o. MRN#: [id number] Attending Physician: Kelvin Cellar, MD Primary Care Physician: [name], MD Admit Date: 03/03/2015  Reason for Consultation/Follow-up: Establishing goals of care  Subjective: Having much worse pain in left side of chest extending into flank.  Pain severe. Oral oxycodone helping and seems to last longer than dilaudid.  No nausea/vomiting. Moving bowels. Passing flatus, no diarrhea.  Dyspnea improving but pain worse with deep breathing.    Length of Stay: 16 days  Current Medications: Scheduled Meds:  . acidophilus  2 capsule Oral BID  . albuterol  2.5 mg Nebulization QID  . antiseptic oral rinse  7 mL Mouth Rinse BID  . budesonide (PULMICORT) nebulizer solution  0.5 mg Nebulization BID  . calcium carbonate  2 tablet Oral BID  . dronabinol  10 mg Oral TID AC  . enoxaparin (LOVENOX) injection  40 mg Subcutaneous Q24H  . feeding supplement (RESOURCE BREEZE)  1 Container Oral TID BM  . fentaNYL  50 mcg Transdermal Q72H  . guaiFENesin  1,200 mg Oral BID  . lactobacillus  1 g Oral TID WC  . lidocaine  15 mL Mouth/Throat Q4H  . methylphenidate  10 mg Oral BID  . multivitamin with minerals  1 tablet Oral Daily  . OLANZapine  5 mg Oral QHS  . pantoprazole  40 mg Oral Daily  . polyethylene glycol  17 g Oral Daily  . senna-docusate  2 tablet Oral BID  . sodium bicarbonate/sodium chloride   Mouth Rinse Q4H  . sodium chloride  10-40 mL Intracatheter Q12H  . Vitamin D (Ergocalciferol)  50,000 Units Oral Q Thu    Continuous Infusions:    PRN Meds: acetaminophen, albuterol, ALPRAZolam, bismuth subsalicylate, diphenhydrAMINE, docusate sodium, docusate sodium, HYDROmorphone (DILAUDID) injection, ibuprofen, metoCLOPramide, ondansetron (ZOFRAN) IV, oxyCODONE, phenol, simethicone, sodium chloride, traMADol, traZODone   Vital Signs: BP 97/62 mmHg  Pulse 84   Temp(Src) 98.5 F (36.9 C) (Oral)  Resp 22  Ht 5\' 7"  (1.702 m)  Wt 75.297 kg (166 lb)  BMI 25.99 kg/m2  SpO2 96%  LMP 04/22/2014 SpO2: SpO2: 96 % O2 Device: O2 Device: Nasal Cannula O2 Flow Rate: O2 Flow Rate (L/min): 2 L/min  Intake/output summary: No intake or output data in the 24 hours ending 03/19/15 1131 Baseline Weight: Weight: 75.297 kg (166 lb) Most recent weight: Weight: 75.297 kg (166 lb)  Physical Exam: GEN: alert, appears in moderate discomfort HEENT: [place], sclera anicteric MSK: significant tenderness along midaxillary line of left chest wall to even light palpation.            Additional Data Reviewed: Recent Labs     03/18/15  0320  03/19/15  0312  WBC  4.8  4.9  HGB  10.0*  11.0*  PLT  201  203  NA  138  138  BUN  10  7  CREATININE  0.79  0.75     Problem List:  Patient Active Problem List   Diagnosis Date Noted  . Flank pain   . Atelectasis   . Palliative care encounter   . Paralysis of diaphragm nerve   . Dyspnea and respiratory abnormality   . Appetite loss   . Chronic pain syndrome   . PNA (pneumonia)   . Acute respiratory failure   . HCAP (healthcare-associated pneumonia)   . Confusion   . Hypoxemia   . Acute  respiratory failure with hypoxia 03/05/2015  . Postprocedural fever 03/05/2015  . Breast cancer, stage 4 03/05/2015  . Thrombocytopenia 03/05/2015  . Post-operative state 03/03/2015  . Brain metastasis 12/04/2014  . Breast cancer metastasized to multiple sites 03/24/2014  . Chest pain 03/19/2014  . Recurrent boils 02/23/2014     Palliative Care Assessment & Plan    Code Status:  Limited code  Goals of Care:  Anvi continues to show improvement from respiratory standpoint, but unfortunately has had worsening left sided chest wall pain extending into flank. Unclear etiology of this pain. Primary team plans on further imaging today. With improved respiratory status, Arantza's goal is to hopefully be able to return home soon and  follow-up with outpatient providers and continue cancer directed therapy.   3. Symptom Management: 1. Chronic Pain:  See below 2. Acute left sided pain- XRay's today. I have increased her PRN oxycodone dose in interim. Will have to see what etiology of pain is.  She feels like it is MSK in nature. Suppose it could be pulled muscle vs pleuritis vs rib fracture if she did have a lot of coughing. Surprised at level of discomfort she is having from this.  3. Dyspnea: improving with supportive care. 4. Appetite Loss- already on zyprexa, steroids, marinol. Doubt additional agents beneficial.  4. Prognosis: Unable to determine  5. Discharge Planning: Home with Home Health   Thank you for allowing the Palliative Medicine Team to assist in the care of this patient.   Total Time: 25 minutes Greater than 50%  of this time was spent counseling and coordinating care related to the above assessment and plan.   Doran Clay, DO  03/19/2015, 11:31 AM  Please contact Palliative Medicine Team phone at [phone] for questions and concerns.

## 2015-03-19 NOTE — Progress Notes (Signed)
Spoke w pt and sister. Pt still having problems w shortness of breath and pain. Sister states they have increased her pain meds. Pt not sure of dc plan at this time. Order for [facility]hc. Will cont to follow. Prev cm had arranged [facility] but not sure pt will want this at disch or change her mind and want [facility]. Will cont to follow and assist as pt progresses.

## 2015-03-19 NOTE — Progress Notes (Signed)
Occupational Therapy Treatment Patient Details Name: Bianca Kent MRN: [id number] DOB: [birth year] Today's Date: 03/19/2015    History of present illness Patient is a 49 yo female with complex medical history significant for metastatic breast cancer with mets to the brain undergoing chemotherapy. Patient is s/p bilateral sinus surgery and Draf III sinusotomy w/ removal of fronto-ethmoid mucocele on 5/11 afterwhich patient developed hypoxia and acute respiroatory failure secondary to significant atelectasis.   OT comments  Pt limited by L flank pain she rates at 8/10. Pt concerned that pain is a set back and is eager to go home.  Pt able to state energy conservation strategies. Requiring min guard assist and moving slower than last visit due to pain.  Will continue to follow.  Follow Up Recommendations  Home health OT;Supervision/Assistance - 24 hour    Equipment Recommendations  3 in 1 bedside comode    Recommendations for Other Services      Precautions / Restrictions Precautions Precautions: Fall Precaution Comments: watch O2 sats       Mobility Bed Mobility   Bed Mobility: Supine to Sit;Sit to Supine     Supine to sit: Supervision;HOB elevated Sit to supine: Supervision;HOB elevated      Transfers   Equipment used: Rolling walker (2 wheeled)   Sit to Stand: Min guard              Balance                                   ADL Overall ADL's : Needs assistance/impaired     Grooming: Standing;Wash/dry hands;Min guard                   Toilet Transfer: RW;Ambulation;Min guard   Toileting- Clothing Manipulation and Hygiene: Sit to/from stand;Min guard       Functional mobility during ADLs: Rolling walker;Min guard General ADL Comments: On 3 L nasal cannula with sats at 93-94%. Pt able to state 3 energy conservation from handout provided last visit.       Vision                     Perception     Praxis       Cognition   Behavior During Therapy: Flat affect Overall Cognitive Status: Within Functional Limits for tasks assessed                       Extremity/Trunk Assessment               Exercises     Shoulder Instructions       General Comments      Pertinent Vitals/ Pain       Pain Assessment: 0-10 Pain Score: 8  Pain Location: L flank Pain Descriptors / Indicators: Sore Pain Intervention(s): Limited activity within patient's tolerance;Monitored during session;Repositioned;Patient requesting pain meds-RN notified  Home Living                                          Prior Functioning/Environment              Frequency Min 3X/week     Progress Toward Goals  OT Goals(current goals can now be found in the care plan section)  Progress towards OT goals: Progressing toward  goals (pt with increased fatigue)  Acute Rehab OT Goals Patient Stated Goal: to go home  Plan Discharge plan remains appropriate    Co-evaluation                 End of Session Equipment Utilized During Treatment: Oxygen;Rolling walker   Activity Tolerance Patient limited by pain   Patient Left in bed;with call bell/phone within reach;with family/visitor present   Nurse Communication Patient requests pain meds        Time: 8882-8003 OT Time Calculation (min): 13 min  Charges: OT General Charges $OT Visit: 1 Procedure OT Treatments $Self Care/Home Management : 8-22 mins  Malka So 03/19/2015, 12:59 PM  [phone]

## 2015-03-19 NOTE — Progress Notes (Signed)
Physical Therapy Treatment Patient Details Name: Bianca Kent MRN: [id number] DOB: Mar 13, 1966 Today's Date: 03/19/2015    History of Present Illness Patient is a 49 yo female with complex medical history significant for metastatic breast cancer with mets to the brain undergoing chemotherapy. Patient is s/p bilateral sinus surgery and Draf III sinusotomy w/ removal of fronto-ethmoid mucocele on 5/11 afterwhich patient developed hypoxia and acute respiroatory failure secondary to significant atelectasis.    PT Comments    Patient with increased pain esp with mobility.  Patient pushing herself to complete ambulation today.  Follow Up Recommendations  Home health PT;Supervision/Assistance - 24 hour;Supervision for mobility/OOB     Equipment Recommendations  Rolling walker with 5" wheels    Recommendations for Other Services       Precautions / Restrictions Precautions Precautions: Fall Precaution Comments: watch O2 sats Restrictions Weight Bearing Restrictions: No    Mobility  Bed Mobility Overal bed mobility: Needs Assistance Bed Mobility: Supine to Sit;Sit to Supine     Supine to sit: Supervision Sit to supine: Supervision   General bed mobility comments: pt able to get to EOB without physical assist, approaching mod I  Transfers Overall transfer level: Needs assistance Equipment used: Rolling walker (2 wheeled) Transfers: Sit to/from Stand Sit to Stand: Min guard         General transfer comment: Supervision for safety.  Ambulation/Gait Ambulation/Gait assistance: Min guard Ambulation Distance (Feet): 200 Feet Assistive device: Rolling walker (2 wheeled) Gait Pattern/deviations: Step-through pattern;Decreased stride length Gait velocity: Decreased Gait velocity interpretation: Below normal speed for age/gender General Gait Details: Patient with slow, guarded gait due to Lt flank pain.  Fatigued quickly.  O2 sats ranged 89 - 95% with ambulation on O2 at 2  l/min.   Stairs            Wheelchair Mobility    Modified Rankin (Stroke Patients Only)       Balance                                    Cognition Arousal/Alertness: Awake/alert Behavior During Therapy: Flat affect Overall Cognitive Status: Within Functional Limits for tasks assessed                      Exercises      General Comments        Pertinent Vitals/Pain Pain Assessment: 0-10 Pain Score: 9  Pain Location: Lt flank Pain Descriptors / Indicators: Aching;Sore Pain Intervention(s): Limited activity within patient's tolerance;Repositioned;Patient requesting pain meds-RN notified    Home Living                      Prior Function            PT Goals (current goals can now be found in the care plan section) Acute Rehab PT Goals Patient Stated Goal: to go home Progress towards PT goals: Progressing toward goals    Frequency  Min 3X/week    PT Plan Current plan remains appropriate    Co-evaluation             End of Session Equipment Utilized During Treatment: Gait belt;Oxygen Activity Tolerance: Patient limited by fatigue;Patient limited by pain Patient left: in bed;with call bell/phone within reach;with family/visitor present     Time: 0102-7253 PT Time Calculation (min) (ACUTE ONLY): 17 min  Charges:  $Gait Training: 8-22 mins  G Codes:      Despina Pole 03/19/2015, 4:56 PM Carita Pian. Sanjuana Kava, [place] Pager [phone]

## 2015-03-20 LAB — MAGNESIUM: Magnesium: 2 mg/dL (ref 1.7–2.4)

## 2015-03-20 MED ORDER — LIDOCAINE 5 % EX PTCH
1.0000 | MEDICATED_PATCH | CUTANEOUS | Status: DC
  Administered 2015-03-20 – 2015-03-22 (×3): 1 via TRANSDERMAL
  Filled 2015-03-20 (×4): qty 1

## 2015-03-20 NOTE — Progress Notes (Signed)
PROGRESS NOTE  DESPINA BOAN WUJ:[id number] DOB: 01-05-1966 DOA: 03/03/2015 PCP: [name], MD  HPI: 49 year old female with a history of metastatic breast cancer to the brain (Dr. [name]), asthma, depression, GERD, was admitted on 03/03/2015 for elective outpatient sinus surgery. The patient underwent a left frontal ethmoid mucocele drainage performed by Dr. [name]. During the surgery, the patient also had her anterior Reform CSF leak from the mucocele repaired with cautery. Postoperatively, the patient has some hypoxemia, and therefore she was kept overnight. On postoperative day #2, the patient's oxygen saturations have somewhat improved and the patient was about to be discharged home. Unfortunately, the patient began developing fever and subsequently was found to have hypoxemia with oxygen saturations in the mid 70s on room air.   She was started on broad spectrum antibiotics as there was high suspicion for aspiration pneumonia and Pulmonary was consulted. Patient has been persistently hypoxemic and required transfer to SDU on 5/20 for increased oxygen requirements. She has finished 9 days of Vancomycin and Zosyn for presumed HCAP.   Subjective / 24 H Interval events -Patient reporting left chest wall pain that started yesterday and has been getting progressively worse. She describes her pain as sharp stabbing, that is worse with deep inspiration and movement/light touch over the area.   Assessment/Plan: Active Problems:   Post-operative state   Acute respiratory failure with hypoxia   Postprocedural fever   Breast cancer, stage 4   Thrombocytopenia   Confusion   Hypoxemia   HCAP (healthcare-associated pneumonia)   Acute respiratory failure   PNA (pneumonia)   Dyspnea and respiratory abnormality   Appetite loss   Chronic pain syndrome   Paralysis of diaphragm nerve   Atelectasis   Palliative care encounter   Flank pain   Intercostal pain   Acute hypoxic and  hypercarbic respiratory failure -Likely secondary to healthcare associated pneumonia with atelectasis and diaphragmatic paralysis contributing as well. - CT scan 5/20 showed right middle/lower lobes collapse, bilateral opacifications.  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  -Two-view chest x-ray performed performed on 03/17/2015  showing bibasilar changes with overall level of inspiration improving significantly since prior exam -finished Vancomycin / Zosyn for presumed PNA 5/13 >> 5/21 for 9 days -Continues to show clinical improvement as she is now on nasal cannula 2 L satting 92%  Thoracic wall pain -Patient complaining of worsening left-sided chest pain which appears musculoskeletal in origin as there was significant pain with palpation of left thoracic wall. She thinks it may be related to a "pulled muscle" that could have resulted from cough.  -Repeat CXR did not show acute changes.  -Patient reporting ongoing pain characterized as sharp/stabbing in the mid axillary chest wall, although there may be some improvement today. Her pain symptoms appear musculoskeletal in origin as there was notable pain with palpation across the region. Will place lidocaine patch to see if this helps.  Hypotension -Patient's blood pressures remaining stable, having last blood pressure 117/69  Thrombocytopenia / Anemia - from chemo vs infection vs consumption.  - platelets now normal - s/p 2U pRBC 5/23  Metastatic breast cancer with brain mets -Dr [name] following  S/p bilateral sinus surgery and Draf III frontal sinusotomy 03/03/2015. - Per ENT  Constipation:  - stool softener   Diet: Diet regular Room service appropriate?: Yes; Fluid consistency:: Thin Fluids: none  DVT Prophylaxis: lovenox on 5/15 stopped due to worsening of thrombocytopenia. Resumed 5/18   Code Status: Partial Code Family Communication: d/w sisters  bedside  Disposition Plan: Transfer orders to med/surg  placed  Consultants:  PCCM  Palliative   medical oncology  Procedures:  bilateral sinus surgery and Draf III frontal sinusotomy 03/03/2015   Antibiotics Vancomycin 5/13 >> 5/21 Zosyn 5/13 >> 5/21   Studies  Ct Angio Chest Pe W/cm &/or Wo Cm 03/12/2015 1. No definite evidence for pulmonary embolism. Evaluation of the distal segmental and subsegmental pulmonary arteries limited due to motion artifact. 2. Interval development of extensive pulmonary consolidation throughout left lung particularly involving the left lower lobe. Findings are nonspecific however may be secondary to an infectious or inflammatory process. Additionally there are new enlarged left hilar lymph nodes which are nonspecific however potentially secondary to the infectious or inflammatory process. Continued radiographic follow-up is recommended to ensure resolution. 3. Suggestion of increased opacification within the right upper lobe which may be secondary to radiation pneumonitis or potentially an infectious/inflammatory process. Metastatic disease not excluded. Recommend attention on followup. 4. Unchanged collapse/consolidation within the right middle and lower lobes with right hemidiaphragm elevation.     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     Objective  Filed Vitals:   03/19/15 2045 03/20/15 0031 03/20/15 0453 03/20/15 0745  BP:  123/65 106/69 117/69  Pulse:  77 75   Temp:  98.6 F (37 C) 98 F (36.7 C) 98.4 F (36.9 C)  TempSrc:  Oral Oral Oral  Resp:  20 16 16   Height:      Weight:   73.12 kg (161 lb 3.2 oz)   SpO2: 91% 95% 93% 92%    Intake/Output Summary (Last 24 hours) at 03/20/15 0909 Last data filed at 03/20/15 0800  Gross per 24 hour  Intake    240 ml  Output      0 ml  Net    240 ml   Filed Weights   03/18/15 0300 03/19/15 1600 03/20/15 0453  Weight: 75.297 kg (166 lb) 73.7 kg (162 lb 7.7 oz) 73.12 kg (161 lb 3.2 oz)    Exam:  General:  Pt doesn't appear as uncomfortable as yesterday, she is awake and alert, states breathing easier.  HEENT: no scleral icterus, PERRL  Cardiovascular: RRR without MRG, 2+ peripheral pulses, trace LE edema  Respiratory: shallow breathing, no wheezing, decreased sounds at the bases  Abdomen: soft, non tender, BS +, no guarding  MSK/Extremities: She had pain with palpation to her left thoracic region, mostly region of mid axillary region extending to her back, I did not see hematoma, erythema, swelling.   Skin: no rashes  Neuro: non focal  Data Reviewed: Basic Metabolic Panel:  Recent Labs Lab 03/14/15 1250  03/16/15 0335 03/17/15 0453 03/18/15 0320 03/19/15 0312 03/20/15 0150  NA 139  --  138 140 138 138  --   K 3.6  --  3.8 3.3* 3.7 3.8  --   CL 99*  --  97* 101 101 101  --   CO2 34*  --  33* 31 29 29   --   GLUCOSE 100*  --  90 84 92 95  --   BUN 10  --  12 11 10 7   --   CREATININE 0.83  --  0.95 0.82 0.79 0.75  --   CALCIUM 9.1  --  9.2 8.8* 8.7* 8.7*  --   MG  --   < > 2.3 2.2 2.1 2.1 2.0  < > = values in this interval not displayed. CBC:  Recent Labs Lab 03/16/15 0335 03/17/15 0340 03/17/15 0453 03/18/15 0320 03/19/15 0312  WBC 7.3 3.9* 4.9 4.8 4.9  HGB 10.4* 7.3* 10.1* 10.0* 11.0*  HCT 31.7* 22.6* 31.7* 31.5* 33.8*  MCV 87.3 87.9 88.1 87.7 88.0  PLT 191 132* 198 201 203   BNP (last 3 results)  Recent Labs  03/05/15 1851  BNP 48.8   Scheduled Meds: . acidophilus  2 capsule Oral BID  . albuterol  2.5 mg Nebulization QID  . antiseptic oral rinse  7 mL Mouth Rinse BID  . budesonide (PULMICORT) nebulizer solution  0.5 mg Nebulization BID  . calcium carbonate  2 tablet Oral BID  . dronabinol  10 mg Oral TID AC  . enoxaparin (LOVENOX) injection  40 mg Subcutaneous Q24H  . feeding supplement (RESOURCE BREEZE)  1 Container Oral TID BM  . fentaNYL  50 mcg Transdermal Q72H  . guaiFENesin  1,200 mg Oral BID  . lactobacillus  1 g Oral  TID WC  . lidocaine  1 patch Transdermal Q24H  . lidocaine  15 mL Mouth/Throat Q4H  . methylphenidate  10 mg Oral BID  . multivitamin with minerals  1 tablet Oral Daily  . OLANZapine  5 mg Oral QHS  . pantoprazole  40 mg Oral Daily  . polyethylene glycol  17 g Oral Daily  . senna-docusate  2 tablet Oral BID  . sodium bicarbonate/sodium chloride   Mouth Rinse Q4H  . sodium chloride  10-40 mL Intracatheter Q12H  . Vitamin D (Ergocalciferol)  50,000 Units Oral Q Thu   Continuous Infusions:   Time spent: 20 minutes  Marzetta Board, MD [facility] Pager [phone]. If 7 PM - 7 AM, please contact night-coverage at [email or web address], password [email or web address] 03/20/2015, 9:09 AM  LOS: 17 days

## 2015-03-21 DIAGNOSIS — G894 Chronic pain syndrome: Secondary | ICD-10-CM

## 2015-03-21 LAB — MAGNESIUM: Magnesium: 2.1 mg/dL (ref 1.7–2.4)

## 2015-03-21 MED ORDER — IBUPROFEN 200 MG PO TABS
600.0000 mg | ORAL_TABLET | Freq: Three times a day (TID) | ORAL | Status: DC | PRN
  Administered 2015-03-21 – 2015-03-22 (×3): 600 mg via ORAL
  Filled 2015-03-21 (×3): qty 3

## 2015-03-21 NOTE — Progress Notes (Signed)
Followed up largely for emotional support. Zaryah states that left sided pain is improving.  Hopeful of being able to go home tomorrow. Wonders if ENT will see her here as she has questions related to sinus surgery and was supposed to have follow-up by this point.  I will plan on following up with her tomorrow.   Doran Clay D.O. Palliative Medicine Team at [facility]  Pager: [phone] Team Phone: [phone]

## 2015-03-21 NOTE — Progress Notes (Signed)
PROGRESS NOTE  Bianca Kent DIY:[id number] DOB: 10-Dec-1965 DOA: 03/03/2015 PCP: [name], MD  HPI: 49 year old female with a history of metastatic breast cancer to the brain (Dr. [name]), asthma, depression, GERD, was admitted on 03/03/2015 for elective outpatient sinus surgery. The patient underwent a left frontal ethmoid mucocele drainage performed by Dr. [name]. During the surgery, the patient also had her anterior Reform CSF leak from the mucocele repaired with cautery. Postoperatively, the patient has some hypoxemia, and therefore she was kept overnight. On postoperative day #2, the patient's oxygen saturations have somewhat improved and the patient was about to be discharged home. Unfortunately, the patient began developing fever and subsequently was found to have hypoxemia with oxygen saturations in the mid 70s on room air.   She was started on broad spectrum antibiotics as there was high suspicion for aspiration pneumonia and Pulmonary was consulted. Patient has been persistently hypoxemic and required transfer to SDU on 5/20 for increased oxygen requirements. She has finished 9 days of Vancomycin and Zosyn for presumed HCAP.   Subjective / 24 H Interval events -Patient reporting left chest wall pain that started yesterday and has been getting progressively worse. She describes her pain as sharp stabbing, that is worse with deep inspiration and movement/light touch over the area.   Assessment/Plan: Active Problems:   Post-operative state   Acute respiratory failure with hypoxia   Postprocedural fever   Breast cancer, stage 4   Thrombocytopenia   Confusion   Hypoxemia   HCAP (healthcare-associated pneumonia)   Acute respiratory failure   PNA (pneumonia)   Dyspnea and respiratory abnormality   Appetite loss   Chronic pain syndrome   Paralysis of diaphragm nerve   Atelectasis   Palliative care encounter   Flank pain   Intercostal pain   Acute hypoxic and  hypercarbic respiratory failure -Likely secondary to healthcare associated pneumonia with atelectasis and diaphragmatic paralysis contributing as well. - CT scan 5/20 showed right middle/lower lobes collapse, bilateral opacifications.  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  -finished Vancomycin / Zosyn for presumed PNA 5/13 >> 5/21 for 9 days -Patient showing significant improvement over the past several days. Maintaining sats in the low 90's on 2L [place] -I think patient may be getting close to discharge, will reassess in am.   Thoracic wall pain -Patient complaining of worsening left-sided chest pain which appears musculoskeletal in origin as there was significant pain with palpation of left thoracic wall. She thinks it may be related to a "pulled muscle" that could have resulted from cough.  -Repeat CXR did not show acute changes.  -States that lidocaine patch may have helped.   Metastatic breast cancer with brain mets -Dr [name] following  S/p bilateral sinus surgery and Draf III frontal sinusotomy 03/03/2015. - Per ENT  Constipation:  - stool softener   Diet: Diet regular Room service appropriate?: Yes; Fluid consistency:: Thin Fluids: none  DVT Prophylaxis: lovenox on 5/15 stopped due to worsening of thrombocytopenia. Resumed 5/18   Code Status: Partial Code Family Communication: d/w sisters bedside  Disposition Plan: Anticipate discharge home in the next 24 hours if she continues to improve.   Consultants:  PCCM  Palliative  Medical oncology  Procedures:  bilateral sinus surgery and Draf III frontal sinusotomy 03/03/2015   Antibiotics Vancomycin 5/13 >> 5/21 Zosyn 5/13 >> 5/21   Studies  Ct Angio Chest Pe W/cm &/or Wo Cm 03/12/2015 1. No definite evidence for pulmonary embolism. Evaluation of the distal segmental and  subsegmental pulmonary arteries limited due to motion artifact. 2. Interval development of extensive pulmonary consolidation throughout  left lung particularly involving the left lower lobe. Findings are nonspecific however may be secondary to an infectious or inflammatory process. Additionally there are new enlarged left hilar lymph nodes which are nonspecific however potentially secondary to the infectious or inflammatory process. Continued radiographic follow-up is recommended to ensure resolution. 3. Suggestion of increased opacification within the right upper lobe which may be secondary to radiation pneumonitis or potentially an infectious/inflammatory process. Metastatic disease not excluded. Recommend attention on followup. 4. Unchanged collapse/consolidation within the right middle and lower lobes with right hemidiaphragm elevation.     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     Objective  Filed Vitals:   03/20/15 2042 03/20/15 2104 03/21/15 0540 03/21/15 0950  BP: 103/60  96/57 125/81  Pulse: 95 93 79 90  Temp: 99 F (37.2 C)  98 F (36.7 C) 99.1 F (37.3 C)  TempSrc: Oral  Oral Oral  Resp: 18 16 18 20   Height:      Weight:   77.2 kg (170 lb 3.1 oz)   SpO2: 91% 92% 96% 90%    Intake/Output Summary (Last 24 hours) at 03/21/15 1342 Last data filed at 03/21/15 0600  Gross per 24 hour  Intake    360 ml  Output      0 ml  Net    360 ml   Filed Weights   03/19/15 1600 03/20/15 0453 03/21/15 0540  Weight: 73.7 kg (162 lb 7.7 oz) 73.12 kg (161 lb 3.2 oz) 77.2 kg (170 lb 3.1 oz)   Exam:  General:  She appears better today, though she complains of left chest wall pain, she seems to be moving around better  HEENT: no scleral icterus, PERRL  Cardiovascular: RRR without MRG, 2+ peripheral pulses, trace LE edema  Respiratory: Normal inspiratory effort, lungs overall clear to auscultation  Abdomen: soft, non tender, BS +, no guarding  MSK/Extremities: She had pain with palpation to her left thoracic region, mostly region of mid axillary region extending to her  back, I did not see hematoma, erythema, swelling.   Skin: no rashes  Neuro: non focal  Data Reviewed: Basic Metabolic Panel:  Recent Labs Lab 03/16/15 0335 03/17/15 0453 03/18/15 0320 03/19/15 0312 03/20/15 0150 03/21/15 0530  NA 138 140 138 138  --   --   K 3.8 3.3* 3.7 3.8  --   --   CL 97* 101 101 101  --   --   CO2 33* 31 29 29   --   --   GLUCOSE 90 84 92 95  --   --   BUN 12 11 10 7   --   --   CREATININE 0.95 0.82 0.79 0.75  --   --   CALCIUM 9.2 8.8* 8.7* 8.7*  --   --   MG 2.3 2.2 2.1 2.1 2.0 2.1   CBC:  Recent Labs Lab 03/16/15 0335 03/17/15 0340 03/17/15 0453 03/18/15 0320 03/19/15 0312  WBC 7.3 3.9* 4.9 4.8 4.9  HGB 10.4* 7.3* 10.1* 10.0* 11.0*  HCT 31.7* 22.6* 31.7* 31.5* 33.8*  MCV 87.3 87.9 88.1 87.7 88.0  PLT 191 132* 198 201 203   BNP (last 3 results)  Recent Labs  03/05/15 1851  BNP 48.8   Scheduled Meds: . acidophilus  2 capsule Oral BID  . albuterol  2.5 mg Nebulization QID  . antiseptic  oral rinse  7 mL Mouth Rinse BID  . budesonide (PULMICORT) nebulizer solution  0.5 mg Nebulization BID  . calcium carbonate  2 tablet Oral BID  . dronabinol  10 mg Oral TID AC  . enoxaparin (LOVENOX) injection  40 mg Subcutaneous Q24H  . feeding supplement (RESOURCE BREEZE)  1 Container Oral TID BM  . fentaNYL  50 mcg Transdermal Q72H  . guaiFENesin  1,200 mg Oral BID  . lactobacillus  1 g Oral TID WC  . lidocaine  1 patch Transdermal Q24H  . lidocaine  15 mL Mouth/Throat Q4H  . methylphenidate  10 mg Oral BID  . multivitamin with minerals  1 tablet Oral Daily  . OLANZapine  5 mg Oral QHS  . pantoprazole  40 mg Oral Daily  . polyethylene glycol  17 g Oral Daily  . senna-docusate  2 tablet Oral BID  . sodium bicarbonate/sodium chloride   Mouth Rinse Q4H  . sodium chloride  10-40 mL Intracatheter Q12H  . Vitamin D (Ergocalciferol)  50,000 Units Oral Q Thu   Continuous Infusions:   Time spent: 20 minutes  Marzetta Board, MD [facility] Pager [phone]. If 7 PM - 7 AM, please contact night-coverage at [email or web address], password [facility] 03/21/2015, 1:42 PM  LOS: 18 days

## 2015-03-22 DIAGNOSIS — R630 Anorexia: Secondary | ICD-10-CM

## 2015-03-22 LAB — MAGNESIUM: Magnesium: 2.1 mg/dL (ref 1.7–2.4)

## 2015-03-22 MED ORDER — IBUPROFEN 600 MG PO TABS: 600.0000 mg | ORAL_TABLET | Freq: Three times a day (TID) | ORAL | Status: DC | PRN

## 2015-03-22 MED ORDER — HEPARIN SOD (PORK) LOCK FLUSH 100 UNIT/ML IV SOLN
500.0000 [IU] | INTRAVENOUS | Status: AC | PRN
  Administered 2015-03-22: 500 [IU]

## 2015-03-22 MED ORDER — DOCUSATE SODIUM 100 MG PO CAPS: 100.0000 mg | ORAL_CAPSULE | Freq: Two times a day (BID) | ORAL | Status: DC | PRN

## 2015-03-22 MED ORDER — OXYCODONE HCL 10 MG PO TABS: 10.0000 mg | ORAL_TABLET | ORAL | Status: DC | PRN

## 2015-03-22 MED ORDER — LIDOCAINE 5 % EX PTCH: 1.0000 | MEDICATED_PATCH | CUTANEOUS | Status: DC

## 2015-03-22 NOTE — Progress Notes (Signed)
Occupational Therapy Treatment Patient Details Name: BONNEY BERRES MRN: [id number] DOB: 07/10/1966 Today's Date: 03/22/2015    History of present illness Patient is a 49 yo female with complex medical history significant for metastatic breast cancer with mets to the brain undergoing chemotherapy. Patient is s/p bilateral sinus surgery and Draf III sinusotomy w/ removal of fronto-ethmoid mucocele on 5/11 afterwhich patient developed hypoxia and acute respiroatory failure secondary to significant atelectasis.   OT comments  Pt is eager to discharge home. Overall performing ADL at a supervision level.  Will have assist of her family at home.  Recommended pt avoid resistive UE exercise if it is exacerbating L flank pain and continue with AROM against gravity.  Follow Up Recommendations  No OT follow up    Equipment Recommendations  None recommended by OT    Recommendations for Other Services      Precautions / Restrictions Precautions Precautions: Fall Precaution Comments: watch O2 sats       Mobility Bed Mobility Overal bed mobility: Modified Independent                Transfers                      Balance                                   ADL       Grooming: Standing;Wash/dry hands;Supervision/safety                   Toilet Transfer: RW;Ambulation;Supervision/safety   Toileting- Clothing Manipulation and Hygiene: Supervision/safety;Sit to/from stand       Functional mobility during ADLs: Rolling walker;Supervision/safety General ADL Comments: Pt knowledgeable in energy conservation.  Pt reminded to use 02 in shower.  Pt has a shower seat already set up at home.  Sister to make certain pt has adequate tubing for 02 concentrator so pt may walk throughout her home.  Encouraged pt to avoid walking up and down stairs during the day and complete ADL prior to going downstairs.        Vision                     Perception      Praxis      Cognition   Behavior During Therapy: WFL for tasks assessed/performed Overall Cognitive Status: Within Functional Limits for tasks assessed                       Extremity/Trunk Assessment               Exercises     Shoulder Instructions       General Comments      Pertinent Vitals/ Pain       Pain Assessment: 0-10 Pain Score: 4  Pain Location: L flank Pain Descriptors / Indicators: Sore Pain Intervention(s): Patient requesting pain meds-RN notified  Home Living                                          Prior Functioning/Environment              Frequency Min 3X/week     Progress Toward Goals  OT Goals(current goals can now be found in the care plan section)  Progress towards OT goals: Progressing toward goals  Acute Rehab OT Goals Patient Stated Goal: to go home  Plan Discharge plan remains appropriate    Co-evaluation                 End of Session Equipment Utilized During Treatment: Oxygen;Rolling walker   Activity Tolerance Patient tolerated treatment well   Patient Left in bed;with call bell/phone within reach;with family/visitor present   Nurse Communication          Time: 0086-7619 OT Time Calculation (min): 22 min  Charges: OT General Charges $OT Visit: 1 Procedure OT Treatments $Self Care/Home Management : 8-22 mins  Malka So 03/22/2015, 10:56 AM  [phone]

## 2015-03-22 NOTE — Progress Notes (Signed)
Physical Therapy Treatment Note  Clinical Impression: SATURATION QUALIFICATIONS: (This note is used to comply with regulatory documentation for home oxygen)  Patient Saturations on Room Air at Rest = 90%  Patient Saturations on Room Air while Ambulating = 86%  Patient Saturations on 2 Liters of oxygen while Ambulating = 90-92%  Please briefly explain why patient needs home oxygen: Patient requires supplemental oxygen to maintain oxygen saturations at acceptable, safe levels with physical activity.  See also full PT note to follow; Thanks,  Roney Marion, [place]  Acute Rehabilitation Services Pager [phone] Office [phone]

## 2015-03-22 NOTE — Care Management Note (Addendum)
Case Management Note  Patient Details  Name: Bianca Kent MRN: [id number] Date of Birth: July 05, 1966  Subjective/Objective:                CM following for progression and d/c planning.   Action/Plan:  03/22/2015 Met with pt and family re HH needs, [place] notified of need for [facility] and HHPT. DME needs are for home O2 and rolling walker. This pt is not ready for Hospice services and she and her family state that her Oncologist does not recommend Hospice service in the home at this time as they plan to continue aggressive treatment.  Expected Discharge Date:         03/22/2015         Expected Discharge Plan:  Home with [facility] services.  In-House Referral:     Discharge planning Services  CM Consult  Post Acute Care Choice:  Home health Choice offered to:  Patient  DME Arranged:  Rolling walker and home oxygen.  DME Agency:  [facility] Arranged:  RN, PT [facility] Agency:  [facility]  Status of Service:  Completed, signed off  Medicare Important Message Given:  No Date Medicare IM Given:    Medicare IM give by:    Date Additional Medicare IM Given:    Additional Medicare Important Message give by:     If discussed at [place] of Stay Meetings, dates discussed:    Additional Comments:  Adron Bene, RN 03/22/2015, 10:54 AM

## 2015-03-22 NOTE — Progress Notes (Signed)
Discharge instructions and medications discussed with patient.  Prescriptions given to patient.  All questions answered.  

## 2015-03-22 NOTE — Progress Notes (Signed)
Physical Therapy Treatment Patient Details Name: Bianca Kent MRN: [id number] DOB: April 20, 1966 Today's Date: 03/22/2015    History of Present Illness Patient is a 49 yo female with complex medical history significant for metastatic breast cancer with mets to the brain undergoing chemotherapy. Patient is s/p bilateral sinus surgery and Draf III sinusotomy w/ removal of fronto-ethmoid mucocele on 5/11 afterwhich patient developed hypoxia and acute respiroatory failure secondary to significant atelectasis.    PT Comments    Session focused on assessing possible need for supplemental O2 with activity (see other note for O2 qualifying numbers); Discussed self-monitoring for activity tolerance, in particular, "taking a picture" of how she felt when her sats decr to 86% to better understand her own activity [place];   Will have adequate assist at home, and noted plans for possible dc home today; pt is hopeful.   Follow Up Recommendations  Home health PT;Supervision/Assistance - 24 hour;Supervision for mobility/OOB     Equipment Recommendations  Rolling walker with 5" wheels    Recommendations for Other Services       Precautions / Restrictions Precautions Precautions: Fall Precaution Comments: watch O2 sats Restrictions Weight Bearing Restrictions: No    Mobility  Bed Mobility Overal bed mobility: Modified Independent                Transfers Overall transfer level: Needs assistance Equipment used: Rolling walker (2 wheeled) Transfers: Sit to/from Stand Sit to Stand: Supervision         General transfer comment: Supervision for safety. Cues to sefl-monitor for activity tolerance  Ambulation/Gait Ambulation/Gait assistance: Supervision Ambulation Distance (Feet): 200 Feet Assistive device: Rolling walker (2 wheeled) Gait Pattern/deviations: Step-through pattern Gait velocity: Decreased   General Gait Details: Cues to self-monitor for activity tolerance;  REquired supplemental O2 to keep O2 sats at safe, acceptable levels with activity   Stairs            Wheelchair Mobility    Modified Rankin (Stroke Patients Only)       Balance                                    Cognition Arousal/Alertness: Awake/alert Behavior During Therapy: WFL for tasks assessed/performed Overall Cognitive Status: Within Functional Limits for tasks assessed                      Exercises      General Comments General comments (skin integrity, edema, etc.): See Other PT note of this date for O2 qualifying numbers      Pertinent Vitals/Pain Pain Assessment: Faces Pain Score: 4  Faces Pain Scale: Hurts little more Pain Location: L flank Pain Descriptors / Indicators: Sore Pain Intervention(s): Monitored during session    Home Living                      Prior Function            PT Goals (current goals can now be found in the care plan section) Acute Rehab PT Goals Patient Stated Goal: to go home PT Goal Formulation: With patient Time For Goal Achievement: 03/23/15 Potential to Achieve Goals: Good Progress towards PT goals: Progressing toward goals    Frequency  Min 3X/week    PT Plan Current plan remains appropriate    Co-evaluation             End of Session  Equipment Utilized During Treatment: Oxygen Activity Tolerance: Patient tolerated treatment well Patient left: in chair;with call bell/phone within reach;with family/visitor present     Time: 1610-9604 PT Time Calculation (min) (ACUTE ONLY): 29 min  Charges:  $Gait Training: 23-37 mins                    G Codes:      Quin Hoop 03/22/2015, 12:46 PM  Roney Marion, [place] Pager [phone] Office [phone]

## 2015-03-22 NOTE — Discharge Summary (Signed)
Physician Discharge Summary  Bianca Kent TGY:[id number] DOB: [birth year] DOA: 03/03/2015  PCP: [name], MD  Admit date: 03/03/2015 Discharge date: 03/22/2015  Time spent: 35 minutes  Recommendations for Outpatient Follow-up:  1. Please follow-up on thoracic wall pain 2. Follow-up on CBC and BMP on hospital follow-up visit 3. Prior to discharge she was set up with home health services for home PT  Discharge Diagnoses:  Active Problems:   Post-operative state   Acute respiratory failure with hypoxia   Postprocedural fever   Breast cancer, stage 4   Thrombocytopenia   Confusion   Hypoxemia   HCAP (healthcare-associated pneumonia)   Acute respiratory failure   PNA (pneumonia)   Dyspnea and respiratory abnormality   Appetite loss   Chronic pain syndrome   Paralysis of diaphragm nerve   Atelectasis   Palliative care encounter   Flank pain   Intercostal pain   Discharge Condition: Stable/improved  Diet recommendation: Regular diet  Filed Weights   03/19/15 1600 03/20/15 0453 03/21/15 0540  Weight: 73.7 kg (162 lb 7.7 oz) 73.12 kg (161 lb 3.2 oz) 77.2 kg (170 lb 3.1 oz)    History of present illness:  49 year old female with a history of metastatic breast cancer to the brain (Dr. [name]), asthma, depression, GERD, was admitted on 03/03/2015 for elective outpatient sinus surgery. The patient underwent a left frontal ethmoid mucocele drainage performed by Dr. [name]. During the surgery, the patient also had her anterior Reform CSF leak from the mucocele repaired with cautery. Postoperatively, the patient has some hypoxemia, and therefore she was kept overnight. On postoperative day #2, the patient's oxygen saturations have somewhat improved and the patient was about to be discharged home. Unfortunately, the patient began developing fever and subsequently was found to have hypoxemia with oxygen saturations in the mid 70s on room air. Remarkably, the patient is not  that short of breath. She is not in any respiratory distress. She denies any chest pain, headache, nausea, vomiting, diarrhea, abdominal pain, dysuria, hematuria. She has a nonproductive cough. The patient has been started on prophylactic doxycycline since the surgery. In addition, the patient was given normal saline although it is unclear exactly the amount that she received.on 03/05/2015, the patient spiked a temperature of 102.52F with some mild tachycardia. She remains hemodynamically stable. The patient was placed on a "facial tent" oxygen with improvement of her oxygenation up to 92%. The patient presently has nasal packing from her surgery and is draining Kool-Aid colored drainage without pus. In addition, the patient last received full dose chemotherapy on 01/07/2015. She did have a partial dose of chemotherapy on 02/25/2015. She previously received radiation to her left temporal area back in February 2016.  Hospital Course:  49 year old female with a history of metastatic breast cancer to the brain (Dr. [name]), asthma, depression, GERD, was admitted on 03/03/2015 for elective outpatient sinus surgery. The patient underwent a left frontal ethmoid mucocele drainage performed by Dr. [name]. During the surgery, the patient also had her anterior Reform CSF leak from the mucocele repaired with cautery. Postoperatively, the patient has some hypoxemia, and therefore she was kept overnight. On postoperative day #2, the patient's oxygen saturations have somewhat improved and the patient was about to be discharged home. Unfortunately, the patient began developing fever and subsequently was found to have hypoxemia with oxygen saturations in the mid 70s on room air.   She was started on broad spectrum antibiotics as there was high suspicion for aspiration pneumonia and Pulmonary  was consulted. Patient has been persistently hypoxemic and required transfer to SDU on 5/20 for increased oxygen requirements.  She has finished 9 days of Vancomycin and Zosyn for presumed HCAP. During this hospitalization she was seen by her oncologist Dr [name]. She had her last chemotherapy on May 5 and has been placed on hold during this hospitalization.   Her hospitalization was complicated by thoracic wall pain over the left mid axillary and left flank regions. Area was tender to touch and worsened with deep inspiration. Repeat CXR's remain stable as if was felt pain symptoms were musculoskeletal in origin. She improved with pain management.   By the day of discharge she had shown significant clinical improvement, was ambulating down the hall, tolerating PO intake, hemodynamically stable. [facility] services were set up for home PT. She was also set up with Home O2.   Acute hypoxic and hypercarbic respiratory failure -Likely secondary to healthcare associated pneumonia with atelectasis and diaphragmatic paralysis contributing as well. - CT scan 5/20 showed right middle/lower lobes collapse, bilateral opacifications.  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  -Two-view chest x-ray performed performed on 03/17/2015 showing bibasilar changes with overall level of inspiration improving significantly since prior exam -finished Vancomycin / Zosyn for presumed PNA 5/13 >> 5/21 for 9 days -Repeat chest x-rays on 03/18/2015 and 03/19/2015 remained stable -Discharged on home oxygen 2 L via nasal cannula  Chest Pain -Patient complaining of worsening left-sided chest pain which appears musculoskeletal in origin as there was significant pain with palpation of left thoracic wall. She thinks it may be related to a "pulled muscle" that could have resulted from cough.  -Repeat CXR did not show acute changes.  -Patient having significant clinical improvement, now ambulating down the hallway  Hypokalemia -Resolved, a.m. lab work on 03/19/2059 showed a potassium of 3.8  Thrombocytopenia / Anemia - from chemo vs infection vs  consumption.  - platelets now normal - s/p 2U pRBC 5/23  Metastatic breast cancer with brain mets -Dr [name] following -Will follow-up at the cancer center in 1 week  S/p bilateral sinus surgery and Draf III frontal sinusotomy 03/03/2015. - Patient will follow-up with Dr. [name] in one week   Procedures:  bilateral sinus surgery and Draf III frontal sinusotomy 03/03/2015  Consultations:  PCCM  Medical Oncology  Palliative Care  Discharge Exam: Filed Vitals:   03/22/15 0556  BP: 87/46  Pulse:   Temp: 97.6 F (36.4 C)  Resp: 16     General: NAD, looks comfortable  HEENT: no scleral icterus, PERRL  Cardiovascular: RRR without MRG, 2+ peripheral pulses, trace LE edema  Respiratory: Normal inspiratory effort, longs are clear  Abdomen: soft, non tender, BS +, no guarding  MSK/Extremities: no clubbing/cyanosis, no joint swelling  Skin: no rashes  Neuro: non focal  Discharge Instructions   Discharge Instructions    Call MD for:  difficulty breathing, headache or visual disturbances    Complete by:  As directed      Call MD for:  extreme fatigue    Complete by:  As directed      Call MD for:  hives    Complete by:  As directed      Call MD for:  persistant dizziness or light-headedness    Complete by:  As directed      Call MD for:  persistant nausea and vomiting    Complete by:  As directed      Call MD for:  redness, tenderness, or signs of  infection (pain, swelling, redness, odor or green/yellow discharge around incision site)    Complete by:  As directed      Call MD for:  severe uncontrolled pain    Complete by:  As directed      Call MD for:  temperature >100.4    Complete by:  As directed      Diet - low sodium heart healthy    Complete by:  As directed      Increase activity slowly    Complete by:  As directed           Current Discharge Medication List    START taking these medications   Details  docusate sodium (COLACE) 100 MG capsule  Take 1 capsule (100 mg total) by mouth 2 (two) times daily as needed for mild constipation or moderate constipation. Qty: 10 capsule, Refills: 0    ibuprofen (ADVIL,MOTRIN) 600 MG tablet Take 1 tablet (600 mg total) by mouth every 8 (eight) hours as needed for moderate pain. Qty: 30 tablet, Refills: 0    lidocaine (LIDODERM) 5 % Place 1 patch onto the skin daily. Remove & Discard patch within 12 hours or as directed by MD Qty: 30 patch, Refills: 0    oxyCODONE 10 MG TABS Take 1-1.5 tablets (10-15 mg total) by mouth every 2 (two) hours as needed for moderate pain or severe pain. Qty: 20 tablet, Refills: 0      CONTINUE these medications which have NOT CHANGED   Details  ALPRAZolam (XANAX) 1 MG tablet Take 1 tablet (1 mg total) by mouth at bedtime as needed for anxiety. Qty: 30 tablet, Refills: 2   Associated Diagnoses: Insomnia due to medical condition; Breast cancer metastasized to multiple sites, right; Fatigue due to treatment; Malignant cachexia    aspirin (ASPIRIN EC) 81 MG EC tablet Take 81 mg by mouth daily. Swallow whole.    Bismuth Subsalicylate (PEPTO-BISMOL PO) Take 1 tablet by mouth as needed (upset stomach).     calcium carbonate (TUMS - DOSED IN MG ELEMENTAL CALCIUM) 500 MG chewable tablet Chew 2 tablets by mouth 2 (two) times daily.    diphenhydrAMINE (SOMINEX) 25 MG tablet Take 100 mg by mouth at bedtime as needed for sleep. Pt states she is taking up to 200 mg Benadryl at night and still not sleeping    dronabinol (MARINOL) 10 MG capsule Take 1 capsule (10 mg total) by mouth 3 (three) times daily before meals. Qty: 90 capsule, Refills: 0   Associated Diagnoses: Breast cancer metastasized to multiple sites, right; Fatigue due to treatment; Malignant cachexia; Insomnia due to medical condition    ergocalciferol (VITAMIN D2) 50000 UNITS capsule Take 1 capsule (50,000 Units total) by mouth once a week. Qty: 4 capsule, Refills: 6   Associated Diagnoses: Breast cancer  metastasized to multiple sites, right    fentaNYL (DURAGESIC) 50 MCG/HR Place 1 patch (50 mcg total) onto the skin every 3 (three) days. Qty: 10 patch, Refills: 0   Associated Diagnoses: Breast cancer metastasized to multiple sites, right; Nausea and vomiting, vomiting of unspecified type    lidocaine (XYLOCAINE) 2 % solution Use as directed 20 mLs in the mouth or throat as needed for mouth pain. Qty: 200 mL, Refills: 2   Associated Diagnoses: Urinary tract infection without hematuria, site unspecified; Breast cancer metastasized to multiple sites, right    loratadine-pseudoephedrine (CLARITIN-D 24-HOUR) 10-240 MG per 24 hr tablet Take 1 tablet by mouth daily.    methylphenidate (RITALIN) 10 MG  tablet Take 1 tablet (10 mg total) by mouth 2 (two) times daily. Qty: 60 tablet, Refills: 0   Associated Diagnoses: Breast cancer metastasized to multiple sites, right; Fatigue due to treatment; Insomnia; Breast cancer metastasized to multiple sites, unspecified laterality; Asthma, mild persistent, uncomplicated    metoCLOPramide (REGLAN) 10 MG tablet Take 1 tablet (10 mg total) by mouth every 6 (six) hours as needed for nausea. Qty: 120 tablet, Refills: 5   Associated Diagnoses: Insomnia due to medical condition; Breast cancer metastasized to multiple sites, right; Fatigue due to treatment; Malignant cachexia    Multiple Vitamin (MULTIVITAMIN) tablet Take 1 tablet by mouth daily.    NONFORMULARY OR COMPOUNDED ITEM Take 5 mLs by mouth as needed (irritation). DUKES MOUTHWASH  SWISH AND SWALLOW   Associated Diagnoses: Breast cancer metastasized to multiple sites, left    OLANZapine (ZYPREXA) 5 MG tablet Take 1 tablet (5 mg total) by mouth at bedtime. Qty: 30 tablet, Refills: 3   Associated Diagnoses: Brain metastasis; Breast cancer metastasized to multiple sites, right; Insomnia due to medical condition; Fatigue due to treatment; Malignant cachexia    ondansetron (ZOFRAN) 8 MG tablet TAKE 1 TABLET  BY MOUTH EVERY 8 HOURS AS NEEDED FOR NAUSEA OR VOMITING Qty: 20 tablet, Refills: 2    pantoprazole (PROTONIX) 40 MG tablet Take 1 tablet (40 mg total) by mouth daily. Qty: 30 tablet, Refills: 6   Associated Diagnoses: Breast cancer metastasized to multiple sites, unspecified laterality    prochlorperazine (COMPAZINE) 10 MG tablet Take 1 tablet (10 mg total) by mouth every 6 (six) hours as needed for nausea or vomiting. Qty: 60 tablet, Refills: 2   Associated Diagnoses: Breast cancer metastasized to multiple sites, unspecified laterality    promethazine (PHENERGAN) 25 MG suppository Place 1 suppository (25 mg total) rectally every 6 (six) hours as needed for nausea or vomiting. Qty: 30 each, Refills: 3   Associated Diagnoses: Breast cancer metastasized to multiple sites, right    traZODone (DESYREL) 100 MG tablet Take 1-2 if needed at bed for sleep. Qty: 30 tablet, Refills: 2   Associated Diagnoses: Insomnia; Breast cancer metastasized to multiple sites, unspecified laterality; Asthma, mild persistent, uncomplicated; Breast cancer metastasized to multiple sites, right; Fatigue due to treatment    albuterol (PROVENTIL HFA;VENTOLIN HFA) 108 (90 BASE) MCG/ACT inhaler Inhale 2 puffs into the lungs every 4 (four) hours as needed for wheezing or shortness of breath. Qty: 2 Inhaler, Refills: 6   Associated Diagnoses: Breast cancer metastasized to multiple sites, unspecified laterality; Asthma, mild persistent, uncomplicated; Insomnia    Lactulose SOLN Take 2 TBLSP every 6 hours until bowel movement Qty: 1000 mL, Refills: 4   Associated Diagnoses: Brain metastasis; Breast cancer metastasized to multiple sites, right; Insomnia due to medical condition; Fatigue due to treatment; Malignant cachexia    pyridoxine (B-6) 250 MG tablet Take 1 tablet (250 mg total) by mouth daily. Qty: 90 tablet, Refills: 4   Associated Diagnoses: Joint movement restricted; Breast cancer metastasized to multiple sites,  left      STOP taking these medications     HYDROmorphone (DILAUDID) 4 MG tablet      lidocaine-prilocaine (EMLA) cream      LORazepam (ATIVAN) 1 MG tablet      naproxen sodium (ANAPROX) 220 MG tablet      traMADol (ULTRAM) 50 MG tablet        Allergies  Allergen Reactions  . Iohexol      Code: RASH, Desc: VERY STRONG FAMILY HX OF  ANGIOEDEMA WHEN RECEIVING IV CONTRAST; PT HAS BEEN PREMEDICATED FOR OTHER CONTRASTED STUDIES(IN CATH. LAB)  KR, Onset Date: [id number]   . Prednisone Itching    Capillary beds bust  . Tetanus Toxoids Other (See Comments)    Ran a high fever for 48 hours  . Theophyllines Hives    Mental changes  . Versed [Midazolam] Other (See Comments)    Pt becomes violent   Follow-up Information    Follow up with [name], MD. Schedule an appointment as soon as possible for a visit in 2 weeks.   Specialty:  Otolaryngology   Contact information:   [address] [place] [address] [phone]       Follow up with [facility].   Why:  hospice services   Contact information:   [address] [place] [address] [phone]       Follow up with [name], MD In 1 week.   Specialty:  Oncology   Contact information:   [address] [place] [address] [phone]        The results of significant diagnostics from this hospitalization (including imaging, microbiology, ancillary and laboratory) are listed below for reference.    Significant Diagnostic Studies: Dg Chest 2 View  03/19/2015   CLINICAL DATA:  Chest pain  EXAM: CHEST  2 VIEW  COMPARISON:  03/18/2015  FINDINGS: Left Port-A-Cath remains in place, unchanged. Continued low lung volumes with bibasilar opacities, likely atelectasis. No effusions. No acute bony abnormality. Heart is normal size.  IMPRESSION: Continued low lung volumes with bibasilar opacities, likely atelectasis. No significant change.   Electronically Signed   By:  Rolm Baptise M.D.   On: 03/19/2015 10:36   Dg Chest 2 View  03/18/2015   CLINICAL DATA:  Stage IV breast cancer, congestion, desaturation  EXAM: CHEST  2 VIEW  COMPARISON:  03/17/2015  FINDINGS: Low lung volumes.  Patchy bilateral lower lobe opacities, likely atelectasis. Small bilateral pleural effusions, right greater than left. No pneumothorax.  The heart is normal in size.  Left chest power port terminates at the cavoatrial junction.  IMPRESSION: Low lung volumes.  Patchy bilateral lower lobe opacities, likely atelectasis.  Small bilateral pleural effusions, right greater than left.   Electronically Signed   By: Julian Hy M.D.   On: 03/18/2015 11:34   Dg Chest 2 View  03/17/2015   CLINICAL DATA:  Shortness of breath and cough  EXAM: CHEST  2 VIEW  COMPARISON:  03/15/2015  FINDINGS: Left-sided chest wall port is again seen. Cardiac shadow is stable. Inspiratory effort has improved when compare with the prior exam. Bibasilar changes are again noted with small right-sided pleural effusion. No bony abnormality is noted.  IMPRESSION: Bibasilar changes although the overall level of inspiration has improved significantly from the prior exam.   Electronically Signed   By: Inez Catalina M.D.   On: 03/17/2015 07:53   Dg Chest 2 View  03/08/2015   CLINICAL DATA:  Hypoxia.  EXAM: CHEST  2 VIEW  COMPARISON:  Mar 06, 2015  FINDINGS: Stable cardiomediastinal silhouette. Left subclavian Port-A-Cath is again noted with distal tip in the expected position of the SVC. Hypoinflation of the lungs is again noted. Mildly increased bibasilar opacities are noted most consistent with subsegmental atelectasis. No pneumothorax is noted. Possible mild right pleural effusion cannot be excluded. Stable elevation of right hemidiaphragm is noted.  IMPRESSION: Hypoinflation of the lungs is again noted. Mildly increased  bibasilar subsegmental atelectasis is noted. Possible mild right pleural effusion is noted.   Electronically  Signed   By: Marijo Conception, M.D.   On: 03/08/2015 08:03   Dg Chest 2 View  03/05/2015   CLINICAL DATA:  Hypoxemia  EXAM: CHEST  2 VIEW  COMPARISON:  12/22/2014  FINDINGS: Cardiac shadow is stable. A left chest wall port is again seen. Bibasilar atelectasis/ infiltrate is noted. No sizable effusion is seen. These changes have increased somewhat in the interval from the prior exam particularly in the left lung base.  IMPRESSION: Bibasilar atelectasis/ infiltrate increased in the left base from the prior study. The right basilar changes are stable.   Electronically Signed   By: Inez Catalina M.D.   On: 03/05/2015 15:39   Ct Angio Chest Pe W/cm &/or Wo Cm  03/12/2015   CLINICAL DATA:  Patient with history of metastatic breast cancer. Status post recent cranial surgery now with elevated D-dimer and hypoxemia.  EXAM: CT ANGIOGRAPHY CHEST WITH CONTRAST  TECHNIQUE: Multidetector CT imaging of the chest was performed using the standard protocol during bolus administration of intravenous contrast. Multiplanar CT image reconstructions and MIPs were obtained to evaluate the vascular anatomy.  CONTRAST:  34mL OMNIPAQUE IOHEXOL 350 MG/ML SOLN  COMPARISON:  PET-CT 12/22/2014; chest CT 11/02/2014  FINDINGS: Mediastinum/Nodes: No significant interval change in size of 9 mm right axillary lymph node (image 110; series 6). Multiple stable sub cm left axillary lymph nodes. Suggestion of new 1.5 cm left hilar lymph node (image 42; series 5). Normal heart size. No pericardial effusion. Aorta and main pulmonary artery are normal in caliber. Left anterior chest wall Port-A-Cath with tip projecting in right atrium.  Opacification of the pulmonary arteries. No definite evidence for pulmonary embolism. Evaluation of the distal segmental and subsegmental pulmonary arteries limited due to motion artifact.  Lungs/Pleura: Central airways are patent. No significant interval change in right lower lobe and right middle lobe consolidation.  Interval increase patchy consolidative opacity within the right upper lobe. Interval development of extensive consolidative opacity within the left lower lobe. Additionally there is scattered ground-glass and consolidative opacification within the left upper lobe. No definite pleural effusion or pneumothorax.  Upper abdomen: Patient status post cholecystectomy. Visualized adrenal glands are unremarkable.  Musculoskeletal: Re- demonstrated osseous metastatic disease particularly involving the visualized thoracic spine. Re- demonstrated skin thickening and parenchymal thickening involving the right breast.  Review of the MIP images confirms the above findings.  IMPRESSION: 1. No definite evidence for pulmonary embolism. Evaluation of the distal segmental and subsegmental pulmonary arteries limited due to motion artifact. 2. Interval development of extensive pulmonary consolidation throughout left lung particularly involving the left lower lobe. Findings are nonspecific however may be secondary to an infectious or inflammatory process. Additionally there are new enlarged left hilar lymph nodes which are nonspecific however potentially secondary to the infectious or inflammatory process. Continued radiographic follow-up is recommended to ensure resolution. 3. Suggestion of increased opacification within the right upper lobe which may be secondary to radiation pneumonitis or potentially an infectious/inflammatory process. Metastatic disease not excluded. Recommend attention on followup. 4. Unchanged collapse/consolidation within the right middle and lower lobes with right hemidiaphragm elevation.   Electronically Signed   By: Lovey Newcomer M.D.   On: 03/12/2015 19:23   Mr Shoulder Right W Wo Contrast  02/23/2015   CLINICAL DATA:  CLINICAL DATA limited movement, painful, metastatic breast cancer left breast RIGHT shoulder pain with decreased range of motion. RIGHT arm numbness  and tingling.  EXAM: MRI OF THE RIGHT  SHOULDER WITHOUT AND WITH CONTRAST  TECHNIQUE: Multiplanar, multisequence MR imaging of the RIGHT shoulder was performed before and after the administration of intravenous contrast.  CONTRAST:  62mL MULTIHANCE GADOBENATE DIMEGLUMINE 529 MG/ML IV SOLN  COMPARISON:  None.  FINDINGS: Rotator cuff: SUPRASPINATUS tendinopathy without tear. Bursal surface fraying of the distal tendon at the insertion. INFRASPINATUS and teres minor appear normal. The SUBSCAPULARIS tendon is normal.  Muscles: No fatty atrophy. Low level edema is present in teres minor, suggesting strain.  Biceps long head:  Intact.  Acromioclavicular Joint: Type 3 acromion. Subacromial bursitis. Mild AC joint osteoarthrosis. Small AC joint effusion.  Glenohumeral Joint: No effusion or synovitis.  Labrum:  Normal.  Bones:  No aggressive osseous lesion or fracture.  IMPRESSION: 1. Type 3 acromion with subacromial bursitis. 2. SUPRASPINATUS tendinopathy with bursal surface fraying of the tendon at the insertion. 3. Low level edema in the teres minor muscle most compatible with strain. In this patient with metastatic breast cancer, radiation induced neuropathy is in the differential considerations. There is no fatty atrophy to suggest a chronic denervation injury. Notably, no mass lesions are present in the suprascapular or spinoglenoid notch. 4. No evidence of metastatic disease to the RIGHT shoulder.   Electronically Signed   By: Dereck Ligas M.D.   On: 02/23/2015 09:27   Dg Sniff Test  03/12/2015   CLINICAL DATA:  Respiratory difficulty.  EXAM: CHEST FLUOROSCOPY  TECHNIQUE: Real-time fluoroscopic evaluation of the chest was performed.  RADIOPHARMACEUTICALS:  None administered  FLUOROSCOPY TIME:  48 seconds  COMPARISON:  Chest radiograph 03/12/2015, CT thorax 11/02/2014  FINDINGS: Initial image demonstrates elevation of the right hemidiaphragm. With fluoroscopic observation of normal breathing, the left diaphragm deflects inferiorly while the right  diaphragm remains static. With forceful inspiration, this asymmetric movement is exaggerated.  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   Electronically Signed   By: Suzy Bouchard M.D.   On: 03/12/2015 14:26   Dg Chest Port 1 View  03/15/2015   CLINICAL DATA:  Pneumonia.  EXAM: PORTABLE CHEST - 1 VIEW  COMPARISON:  03/12/2015  FINDINGS: Shallow lung inflation. Persistent elevation of the right hemidiaphragm. There is increased opacity at the left lung base consistent with infiltrate. There is blunting of the costophrenic angles bilaterally. Left Port-A-Cath accessed.  IMPRESSION: Increased left lower lobe opacity consistent with infectious   Electronically Signed   By: Nolon Nations M.D.   On: 03/15/2015 08:14   Dg Chest Port 1 View  03/12/2015   CLINICAL DATA:  Hypoxemia.  EXAM: PORTABLE CHEST - 1 VIEW  COMPARISON:  03/10/2015  FINDINGS: Left chest port remains in place, tip in the SVC. Lung volumes remain low, there is elevation of right hemidiaphragm. Unchanged blunting of right costophrenic angle. Cardiomediastinal contours are unchanged, however partially obscured. Persist increased interstitial opacities, however improved from prior. Bibasilar atelectasis, left greater than right, not significantly changed.  IMPRESSION: 1. Mild radiographic improvement of the chest with persistent but decreasing interstitial opacities. 2. Lung volumes remain low with bibasilar atelectasis, unchanged.   Electronically Signed   By: Jeb Levering M.D.   On: 03/12/2015 06:38   Dg Chest Port 1 View  03/10/2015   CLINICAL DATA:  Hypoxia, status post sinus surgery, history of asthma breast malignancy and previous tobacco use.  EXAM: PORTABLE CHEST - 1 VIEW  COMPARISON:  PA and lateral chest of Mar 08, 2015  FINDINGS: There is persistent  bilateral pulmonary hypo inflation. The pulmonary interstitial markings remain increased but have slightly improved since yesterday's study. Small  amounts of pleural fluid on the right are suspected. The cardiac silhouette is largely obscured. The central pulmonary vascularity is prominent but less conspicuous than on yesterday's study. A Port-A-Cath appliance tip projects over the midportion of the SVC.  IMPRESSION: Persistent bilateral pulmonary hypoinflation with mild improvement in the appearance of the interstitium. A small right pleural effusion is suspected.   Electronically Signed   By: David  Martinique M.D.   On: 03/10/2015 07:28   Dg Chest Port 1 View  03/06/2015   CLINICAL DATA:  Acute respiratory failure, short of breath.  EXAM: PORTABLE CHEST - 1 VIEW  COMPARISON:  Radiograph 03/05/2015  FINDINGS: Left car port unchanged. There are low lung volumes and bibasilar atelectasis again demonstrated. Chronic elevation of the right hemidiaphragm. Right basilar atelectasis.  IMPRESSION: No interval change. Bibasilar atelectasis. Cannot exclude left lower lobe infiltrate.   Electronically Signed   By: Suzy Bouchard M.D.   On: 03/06/2015 15:35    Microbiology: No results found for this or any previous visit (from the past 240 hour(s)).   Labs: Basic Metabolic Panel:  Recent Labs Lab 03/16/15 0335 03/17/15 0453 03/18/15 0320 03/19/15 0312 03/20/15 0150 03/21/15 0530 03/22/15 0537  NA 138 140 138 138  --   --   --   K 3.8 3.3* 3.7 3.8  --   --   --   CL 97* 101 101 101  --   --   --   CO2 33* 31 29 29   --   --   --   GLUCOSE 90 84 92 95  --   --   --   BUN 12 11 10 7   --   --   --   CREATININE 0.95 0.82 0.79 0.75  --   --   --   CALCIUM 9.2 8.8* 8.7* 8.7*  --   --   --   MG 2.3 2.2 2.1 2.1 2.0 2.1 2.1   Liver Function Tests: No results for input(s): AST, ALT, ALKPHOS, BILITOT, PROT, ALBUMIN in the last 168 hours. No results for input(s): LIPASE, AMYLASE in the last 168 hours. No results for input(s): AMMONIA in the last 168 hours. CBC:  Recent Labs Lab 03/16/15 0335 03/17/15 0340 03/17/15 0453 03/18/15 0320  03/19/15 0312  WBC 7.3 3.9* 4.9 4.8 4.9  HGB 10.4* 7.3* 10.1* 10.0* 11.0*  HCT 31.7* 22.6* 31.7* 31.5* 33.8*  MCV 87.3 87.9 88.1 87.7 88.0  PLT 191 132* 198 201 203   Cardiac Enzymes: No results for input(s): CKTOTAL, CKMB, CKMBINDEX, TROPONINI in the last 168 hours. BNP: BNP (last 3 results)  Recent Labs  03/05/15 1851  BNP 48.8    ProBNP (last 3 results) No results for input(s): PROBNP in the last 8760 hours.  CBG: No results for input(s): GLUCAP in the last 168 hours.     SignedKelvin Cellar  [facility] 03/22/2015, 8:10 AM

## 2015-03-23 ENCOUNTER — Ambulatory Visit: Payer: 59 | Admitting: Physical Therapy

## 2015-03-23 ENCOUNTER — Telehealth: Payer: Self-pay | Admitting: *Deleted

## 2015-03-23 NOTE — Telephone Encounter (Signed)
Patient's sister called to let us know that patient was discharged from the hospital and needs follow up appointment. Spoke to Dr [name] who wants patient to come back next week for doctor, lab and treatment appointment. Message sent to scheduler.

## 2015-03-24 ENCOUNTER — Telehealth: Payer: Self-pay | Admitting: Hematology & Oncology

## 2015-03-24 NOTE — Telephone Encounter (Signed)
Contacted pt regarding appt 6/7 at 10:15. Appt was confirmed by family member and mess lt on cell phone

## 2015-03-25 ENCOUNTER — Ambulatory Visit: Payer: 59 | Admitting: Physical Therapy

## 2015-03-25 ENCOUNTER — Telehealth: Payer: Self-pay | Admitting: Hematology & Oncology

## 2015-03-25 NOTE — Telephone Encounter (Signed)
To:  Milana Kidney, RN CM   Fx:  [phone] Ph:  [phone] ext [phone]  APPROVED Please review patient below for CASE MGMT. CASE ID: [id number]  PATIENT:       Bianca Kent DOB:               Aug 20, 1966 MRN:               [id number]  Phone:                        [phone] ID:                   [id number]        GRP:     [id number] DX:      Breast cancer metastasized to multiple sites -  c50.911, c79.9          Jcodes:           [place]:               03/24/2015  - 09/23/2015

## 2015-03-30 ENCOUNTER — Ambulatory Visit (HOSPITAL_BASED_OUTPATIENT_CLINIC_OR_DEPARTMENT_OTHER): Payer: 59 | Admitting: Hematology & Oncology

## 2015-03-30 ENCOUNTER — Other Ambulatory Visit (HOSPITAL_BASED_OUTPATIENT_CLINIC_OR_DEPARTMENT_OTHER): Payer: 59

## 2015-03-30 ENCOUNTER — Ambulatory Visit (HOSPITAL_BASED_OUTPATIENT_CLINIC_OR_DEPARTMENT_OTHER): Payer: 59

## 2015-03-30 ENCOUNTER — Ambulatory Visit (HOSPITAL_BASED_OUTPATIENT_CLINIC_OR_DEPARTMENT_OTHER)
Admission: RE | Admit: 2015-03-30 | Discharge: 2015-03-30 | Disposition: A | Discharge status: Home / Self Care | Payer: 59 | Source: Ambulatory Visit | Attending: Hematology & Oncology | Admitting: Hematology & Oncology

## 2015-03-30 ENCOUNTER — Encounter: Payer: Self-pay | Admitting: Hematology & Oncology

## 2015-03-30 VITALS — BP 135/87 | HR 79 | Temp 97.9°F | Resp 18 | Wt 155.0 lb

## 2015-03-30 DIAGNOSIS — J984 Other disorders of lung: Secondary | ICD-10-CM | POA: Diagnosis not present

## 2015-03-30 DIAGNOSIS — C773 Secondary and unspecified malignant neoplasm of axilla and upper limb lymph nodes: Secondary | ICD-10-CM

## 2015-03-30 DIAGNOSIS — C7951 Secondary malignant neoplasm of bone: Secondary | ICD-10-CM | POA: Diagnosis not present

## 2015-03-30 DIAGNOSIS — Z8701 Personal history of pneumonia (recurrent): Secondary | ICD-10-CM | POA: Diagnosis present

## 2015-03-30 DIAGNOSIS — C50911 Malignant neoplasm of unspecified site of right female breast: Secondary | ICD-10-CM

## 2015-03-30 DIAGNOSIS — Z5112 Encounter for antineoplastic immunotherapy: Secondary | ICD-10-CM | POA: Diagnosis not present

## 2015-03-30 DIAGNOSIS — Z5111 Encounter for antineoplastic chemotherapy: Secondary | ICD-10-CM

## 2015-03-30 DIAGNOSIS — C50811 Malignant neoplasm of overlapping sites of right female breast: Secondary | ICD-10-CM | POA: Diagnosis not present

## 2015-03-30 DIAGNOSIS — C7931 Secondary malignant neoplasm of brain: Secondary | ICD-10-CM | POA: Diagnosis not present

## 2015-03-30 DIAGNOSIS — J986 Disorders of diaphragm: Secondary | ICD-10-CM | POA: Insufficient documentation

## 2015-03-30 DIAGNOSIS — M359 Systemic involvement of connective tissue, unspecified: Secondary | ICD-10-CM

## 2015-03-30 DIAGNOSIS — K117 Disturbances of salivary secretion: Secondary | ICD-10-CM

## 2015-03-30 LAB — CBC WITH DIFFERENTIAL (CANCER CENTER ONLY)
BASO#: 0 10*3/uL (ref 0.0–0.2)
BASO%: 0.3 % (ref 0.0–2.0)
EOS ABS: 0.1 10*3/uL (ref 0.0–0.5)
EOS%: 2 % (ref 0.0–7.0)
HCT: 39.8 % (ref 34.8–46.6)
HEMOGLOBIN: 13.4 g/dL (ref 11.6–15.9)
LYMPH#: 1.1 10*3/uL (ref 0.9–3.3)
LYMPH%: 17.8 % (ref 14.0–48.0)
MCH: 28.9 pg (ref 26.0–34.0)
MCHC: 33.7 g/dL (ref 32.0–36.0)
MCV: 86 fL (ref 81–101)
MONO#: 0.5 10*3/uL (ref 0.1–0.9)
MONO%: 7.8 % (ref 0.0–13.0)
NEUT#: 4.3 10*3/uL (ref 1.5–6.5)
NEUT%: 72.1 % (ref 39.6–80.0)
PLATELETS: 138 10*3/uL — AB (ref 145–400)
RBC: 4.64 10*6/uL (ref 3.70–5.32)
RDW: 16.4 % — ABNORMAL HIGH (ref 11.1–15.7)
WBC: 5.9 10*3/uL (ref 3.9–10.0)

## 2015-03-30 LAB — CMP (CANCER CENTER ONLY)
ALBUMIN: 3.5 g/dL (ref 3.3–5.5)
ALT: 27 U/L (ref 10–47)
AST: 39 U/L — ABNORMAL HIGH (ref 11–38)
Alkaline Phosphatase: 187 U/L — ABNORMAL HIGH (ref 26–84)
BILIRUBIN TOTAL: 0.7 mg/dL (ref 0.20–1.60)
BUN, Bld: 5 mg/dL — ABNORMAL LOW (ref 7–22)
CO2: 26 meq/L (ref 18–33)
Calcium: 9.3 mg/dL (ref 8.0–10.3)
Chloride: 104 mEq/L (ref 98–108)
Creat: 0.8 mg/dl (ref 0.6–1.2)
Glucose, Bld: 92 mg/dL (ref 73–118)
Potassium: 3.4 mEq/L (ref 3.3–4.7)
Sodium: 140 mEq/L (ref 128–145)
Total Protein: 8.7 g/dL — ABNORMAL HIGH (ref 6.4–8.1)

## 2015-03-30 MED ORDER — SODIUM CHLORIDE 0.9 % IV SOLN
160.0000 mg | Freq: Once | INTRAVENOUS | Status: AC
  Administered 2015-03-30: 160 mg via INTRAVENOUS
  Filled 2015-03-30: qty 8

## 2015-03-30 MED ORDER — PALONOSETRON HCL INJECTION 0.25 MG/5ML
0.2500 mg | Freq: Once | INTRAVENOUS | Status: AC
  Administered 2015-03-30: 0.25 mg via INTRAVENOUS

## 2015-03-30 MED ORDER — ACETAMINOPHEN 325 MG PO TABS
ORAL_TABLET | ORAL | Status: AC
  Filled 2015-03-30: qty 2

## 2015-03-30 MED ORDER — SODIUM CHLORIDE 0.9 % IV SOLN
150.0000 mg | Freq: Once | INTRAVENOUS | Status: AC
  Administered 2015-03-30: 150 mg via INTRAVENOUS
  Filled 2015-03-30: qty 5

## 2015-03-30 MED ORDER — DIPHENHYDRAMINE HCL 25 MG PO CAPS
50.0000 mg | ORAL_CAPSULE | Freq: Once | ORAL | Status: AC
  Administered 2015-03-30: 50 mg via ORAL

## 2015-03-30 MED ORDER — ZOLEDRONIC ACID 4 MG/100ML IV SOLN
4.0000 mg | Freq: Once | INTRAVENOUS | Status: AC
  Administered 2015-03-30: 4 mg via INTRAVENOUS
  Filled 2015-03-30: qty 100

## 2015-03-30 MED ORDER — PALONOSETRON HCL INJECTION 0.25 MG/5ML
INTRAVENOUS | Status: AC
  Filled 2015-03-30: qty 5

## 2015-03-30 MED ORDER — PILOCARPINE HCL 5 MG PO TABS: 5.0000 mg | ORAL_TABLET | Freq: Two times a day (BID) | ORAL | Status: DC

## 2015-03-30 MED ORDER — ACETAMINOPHEN 325 MG PO TABS
650.0000 mg | ORAL_TABLET | Freq: Once | ORAL | Status: AC
  Administered 2015-03-30: 650 mg via ORAL

## 2015-03-30 MED ORDER — DIPHENHYDRAMINE HCL 25 MG PO CAPS
ORAL_CAPSULE | ORAL | Status: AC
  Filled 2015-03-30: qty 2

## 2015-03-30 MED ORDER — SODIUM CHLORIDE 0.9 % IV SOLN
Freq: Once | INTRAVENOUS | Status: AC
  Administered 2015-03-30: 12:00:00 via INTRAVENOUS

## 2015-03-30 MED ORDER — SODIUM CHLORIDE 0.9 % IV SOLN
336.0000 mg | Freq: Once | INTRAVENOUS | Status: AC
  Administered 2015-03-30: 330 mg via INTRAVENOUS
  Filled 2015-03-30: qty 11

## 2015-03-30 MED ORDER — SODIUM CHLORIDE 0.9 % IJ SOLN
10.0000 mL | INTRAMUSCULAR | Status: DC | PRN
  Administered 2015-03-30: 10 mL
  Filled 2015-03-30: qty 10

## 2015-03-30 MED ORDER — HEPARIN SOD (PORK) LOCK FLUSH 100 UNIT/ML IV SOLN
500.0000 [IU] | Freq: Once | INTRAVENOUS | Status: AC | PRN
  Administered 2015-03-30: 500 [IU]
  Filled 2015-03-30: qty 5

## 2015-03-30 NOTE — Progress Notes (Signed)
Hematology and Oncology Follow Up Visit  Bianca Kent [id number] 1966-04-04 49 y.o. 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 She has been having an incredibly difficult time since we last saw her. [facility] We had sent her over to see ENT. 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 She had surgery. She responded well to surgery. However, when she was ready to go home, something happened that no one can really figure out. She also landed up in the ICU. She had bad pneumonia. She had a paralyzed right diaphragm. She was confused. She was hypoxic.  He took the workup a lot of people to finally get her better. She finally did get better. She required quite a bit of antibiotics and pulmonary medications.  She comes in today with some supplemental oxygen. She is at home. She still has some shortness of breath. We will have to check another chest x-ray on her to see how things look.  I'm not sure as to what exactly happened. I don't know why she would have the paralyzed right hemidiaphragm.  Her appetite is down. We tried her on Marinol. We tried her on Megace. Nothing has really helped.  She gets terrible tongue sores after treatment. I've thought about changing treatment on her but so far she is responding. We had planned for another PET scan on her but she was hospitalized.  I will try some Salagen on her to see if this helps with the saliva production. We'll try her on 5 mg twice a day.  She also rinses her mouth out with Biotene. I told her to try some water and baking  soda.  Pain control sees be doing pretty well right now.  She's not had any issues with bowels or bladder. She's had no bleeding issues. She's had no leg swelling. She does feel tired. She's not had not as much nausea vomiting. She's going to the bathroom okay. She's had no leg swelling.  Again, she really had a tough time in the hospital. However, she is recovering well.  She's not had any headache issues. She has good vision right now. Her left eye is moving quite well.  Overall, her performance status is ECOG 1.   Medications:  Current outpatient prescriptions:  .  albuterol (PROVENTIL HFA;VENTOLIN HFA) 108 (90 BASE) MCG/ACT inhaler, Inhale 2 puffs into the lungs every 4 (four) hours as needed for wheezing or shortness of breath. (Patient not taking: Reported on 02/25/2015), Disp: 2 Inhaler, Rfl: 6 .  ALPRAZolam (XANAX) 1 MG tablet, Take 1 tablet (1 mg total) by mouth at bedtime as needed for anxiety., Disp: 30 tablet, Rfl: 2 .  aspirin (ASPIRIN EC) 81 MG EC tablet, Take 81 mg by mouth daily. Swallow whole., Disp: , Rfl:  .  Bismuth Subsalicylate (PEPTO-BISMOL PO), Take 1 tablet by mouth as needed (upset stomach). , Disp: , Rfl:  .  calcium carbonate (TUMS - DOSED IN MG ELEMENTAL CALCIUM) 500 MG chewable tablet, Chew 2 tablets by mouth 2 (two) times daily., Disp: , Rfl:  .  diphenhydrAMINE (SOMINEX) 25 MG tablet, Take 100 mg by mouth at bedtime as needed for sleep. Pt states she is taking up to 200 mg Benadryl at night and still not sleeping, Disp: , Rfl:  .  docusate sodium (COLACE) 100 MG capsule, Take 1 capsule (100 mg total) by mouth 2 (two) times daily as needed for mild constipation or moderate constipation., Disp: 10 capsule, Rfl: 0 .  dronabinol (MARINOL) 10 MG capsule, Take 1 capsule (10 mg total) by mouth 3 (three) times daily before meals., Disp: 90 capsule, Rfl: 0 .  ergocalciferol (VITAMIN D2) 50000 UNITS capsule, Take 1 capsule (50,000 Units total) by mouth once a week., Disp: 4  capsule, Rfl: 6 .  fentaNYL (DURAGESIC) 50 MCG/HR, Place 1 patch (50 mcg total) onto the skin every 3 (three) days., Disp: 10 patch, Rfl: 0 .  ibuprofen (ADVIL,MOTRIN) 600 MG tablet, Take 1 tablet (600 mg total) by mouth every 8 (eight) hours as needed for moderate pain., Disp: 30 tablet, Rfl: 0 .  Lactulose SOLN, Take 2 TBLSP every 6 hours until bowel movement (Patient not taking: Reported on 02/25/2015), Disp: 1000 mL, Rfl: 4 .  lidocaine (LIDODERM) 5 %, Place 1 patch onto the skin daily. Remove & Discard patch within 12 hours or as directed by MD, Disp: 20 patch, Rfl: 0 .  lidocaine (XYLOCAINE) 2 % solution, Use as directed 20 mLs in the mouth or throat as needed for mouth pain., Disp: 200 mL, Rfl: 2 .  loratadine-pseudoephedrine (CLARITIN-D 24-HOUR) 10-240 MG per 24 hr tablet, Take 1 tablet by mouth daily., Disp: , Rfl:  .  methylphenidate (RITALIN) 10 MG tablet, Take 1 tablet (10 mg total) by mouth 2 (two) times daily., Disp: 60 tablet, Rfl: 0 .  metoCLOPramide (REGLAN) 10 MG tablet, Take 1 tablet (10 mg total) by mouth every 6 (six) hours as needed for nausea., Disp: 120 tablet, Rfl: 5 .  Multiple Vitamin (MULTIVITAMIN) tablet, Take 1 tablet by mouth daily., Disp: , Rfl:  .  NONFORMULARY OR COMPOUNDED ITEM, Take 5 mLs by mouth as needed (irritation). DUKES MOUTHWASH  SWISH AND SWALLOW, Disp: , Rfl:  .  OLANZapine (ZYPREXA) 5 MG tablet, Take 1 tablet (5 mg total) by mouth at bedtime., Disp: 30 tablet, Rfl: 3 .  ondansetron (ZOFRAN) 8 MG tablet, TAKE 1 TABLET BY MOUTH EVERY 8 HOURS AS NEEDED FOR NAUSEA OR VOMITING, Disp: 20 tablet, Rfl: 2 .  oxyCODONE 10 MG TABS, Take 1-1.5 tablets (10-15 mg total) by mouth every 2 (two) hours as needed for moderate pain or severe pain., Disp: 20 tablet, Rfl: 0 .  pantoprazole (PROTONIX) 40 MG tablet, Take 1 tablet (40 mg total) by mouth daily., Disp: 30 tablet, Rfl: 6 .  pilocarpine (SALAGEN) 5 MG tablet, Take 1 tablet (5 mg total) by mouth 2 (two) times daily.,  Disp: 60 tablet, Rfl: 3 .  prochlorperazine (COMPAZINE) 10 MG tablet, Take 1 tablet (10 mg total) by mouth every 6 (six) hours as needed for nausea or vomiting., Disp: 60 tablet, Rfl: 2 .  promethazine (PHENERGAN) 25 MG suppository, Place 1 suppository (25 mg total) rectally every 6 (six) hours as needed for nausea or vomiting., Disp: 30 each, Rfl: 3 .  pyridoxine (B-6) 250 MG tablet, Take 1 tablet (250 mg total) by mouth daily. (Patient not taking: Reported on 02/25/2015), Disp: 90 tablet, Rfl: 4 .  traZODone (DESYREL) 100 MG tablet, Take 1-2 if needed at bed for sleep. (Patient taking differently: Take 100-200 mg by  mouth at bedtime as needed for sleep. Take 1-2 if needed at bed for sleep.), Disp: 30 tablet, Rfl: 2 No current facility-administered medications for this visit.  Facility-Administered Medications Ordered in Other Visits:  .  ado-trastuzumab emtansine (KADCYLA) 160 mg in sodium chloride 0.9 % 250 mL chemo infusion, 160 mg, Intravenous, Once, [name], MD .  fosaprepitant (EMEND) 150 mg in sodium chloride 0.9 % 145 mL IVPB, 150 mg, Intravenous, Once, [name], MD .  heparin lock flush 100 unit/mL, 500 Units, Intracatheter, Once PRN, [name], MD .  palonosetron (ALOXI) injection 0.25 mg, 0.25 mg, Intravenous, Once, [name], MD .  pertuzumab (PERJETA) 330 mg in sodium chloride 0.9 % 250 mL chemo infusion, 330 mg, Intravenous, Once, [name], MD .  sodium chloride 0.9 % injection 10 mL, 10 mL, Intracatheter, PRN, [name], MD .  Zoledronic Acid (ZOMETA) 4 mg IVPB, 4 mg, Intravenous, Once, [name], MD, Last Rate: 133.3 mL/hr at 03/30/15 1235, 4 mg at 03/30/15 1235  Allergies:  Allergies  Allergen Reactions  . Iohexol      Code: RASH, Desc: VERY STRONG FAMILY HX OF ANGIOEDEMA WHEN RECEIVING IV CONTRAST; PT HAS BEEN PREMEDICATED FOR OTHER CONTRASTED STUDIES(IN CATH. LAB)  KR, Onset Date: [id number]   . Prednisone Itching    Capillary beds  bust  . Tetanus Toxoids Other (See Comments)    Ran a high fever for 48 hours  . Theophyllines Hives    Mental changes  . Versed [Midazolam] Other (See Comments)    Pt becomes violent    Past Medical History, Surgical history, Social history, and Family History were reviewed and updated.  Review of Systems: As above  Physical Exam:  weight is 155 lb (70.308 kg). Her oral temperature is 97.9 F (36.6 C). Her blood pressure is 135/87 and her pulse is 79. Her respiration is 18 and oxygen saturation is 98%.   Well-developed and well-nourished white female. Head and exam shows no ocular or oral lesions. She's able to move both eyes well. She has good extraocular muscle . There is no erythema or exudate from the left eye. There might be some slight fullness in the right axilla by do not detect any obvious adenopathy. I cannot detect any obvious right supraclavicular lymph nodes. She has improved range of motion of the right shoulder. Left supraclavicular region is okay. No masses are noted. Lungs are clear. Cardiac exam regular rate and rhythm with no murmurs, rubs or bruits.. Abdomen is soft. She has good bowel sounds. There is no palpable liver or spleen tip. Back exam shows decreased tenderness over the lumbar and thoracic spine to palpation. Extremities shows no clubbing, cyanosis or edema. She has 4/5 strength in her legs. Skin exam shows improvement in the radiation dermatitis of the right breast and axilla. There is no open wound. There is some hyper pigmentation. Neurological exam is nonfocal.    Lab Results  Component Value Date   WBC 5.9 03/30/2015   HGB 13.4 03/30/2015   HCT 39.8 03/30/2015   MCV 86 03/30/2015   PLT 138* 03/30/2015     Chemistry      Component Value Date/Time   NA 140 03/30/2015 1023   NA 138 03/19/2015 0312   K 3.4 03/30/2015 1023   K 3.8 03/19/2015 0312   CL 104 03/30/2015 1023   CL 101 03/19/2015 0312   CO2 26 03/30/2015 1023   CO2 29 03/19/2015 1443  BUN 5* 03/30/2015 1023   BUN 7 03/19/2015 0312   CREATININE 0.8 03/30/2015 1023   CREATININE 0.75 03/19/2015 0312      Component Value Date/Time   CALCIUM 9.3 03/30/2015 1023   CALCIUM 8.7* 03/19/2015 0312   ALKPHOS 187* 03/30/2015 1023   ALKPHOS 238* 03/06/2015 0425   AST 39* 03/30/2015 1023   AST 41 03/06/2015 0425   ALT 27 03/30/2015 1023   ALT 38 03/06/2015 0425   BILITOT 0.70 03/30/2015 1023   BILITOT 0.4 03/06/2015 0425         Impression and Plan: Bianca Kent is 49 year old white female with triple positive metastatic breast cancer. We now have her on Kadcyla. She tolerated her first 8 cycles well.   Again, we will see about a PET scan after this cycle of chemotherapy.  I'm just thankful that she improved. I'm still not sure as to what happened or why she had a hard time after surgery. I can't see anything that would've triggered this pulmonary decompensation. Maybe, she could've aspirated.  We will see what her chest x-ray shows today.  I spent about 45 minutes with she and her sister. As always, we had a good prayer session. [name], MD 6/7/201612:51 PM

## 2015-03-30 NOTE — Patient Instructions (Signed)
Pertuzumab injection What is this medicine? PERTUZUMAB (per TOOZ ue mab) is a monoclonal antibody that targets a protein called HER2. HER2 is found in some breast cancers. This medicine can stop cancer cell growth. This medicine is used with other cancer treatments. This medicine may be used for other purposes; ask your health care provider or pharmacist if you have questions. COMMON BRAND NAME(S): PERJETA What should I tell my health care provider before I take this medicine? They need to know if you have any of these conditions: -heart disease -heart failure -high blood pressure -history of irregular heart beat -recent or ongoing radiation therapy -an unusual or allergic reaction to pertuzumab, other medicines, foods, dyes, or preservatives -pregnant or trying to get pregnant -breast-feeding How should I use this medicine? This medicine is for infusion into a vein. It is given by a health care professional in a hospital or clinic setting. Talk to your pediatrician regarding the use of this medicine in children. Special care may be needed. Overdosage: If you think you've taken too much of this medicine contact a poison control center or emergency room at once. Overdosage: If you think you have taken too much of this medicine contact a poison control center or emergency room at once. NOTE: This medicine is only for you. Do not share this medicine with others. What if I miss a dose? It is important not to miss your dose. Call your doctor or health care professional if you are unable to keep an appointment. What may interact with this medicine? Interactions are not expected. Give your health care provider a list of all the medicines, herbs, non-prescription drugs, or dietary supplements you use. Also tell them if you smoke, drink alcohol, or use illegal drugs. Some items may interact with your medicine. This list may not describe all possible interactions. Give your health care provider a  list of all the medicines, herbs, non-prescription drugs, or dietary supplements you use. Also tell them if you smoke, drink alcohol, or use illegal drugs. Some items may interact with your medicine. What should I watch for while using this medicine? Your condition will be monitored carefully while you are receiving this medicine. Report any side effects. Continue your course of treatment even though you feel ill unless your doctor tells you to stop. Do not become pregnant while taking this medicine. Women should inform their doctor if they wish to become pregnant or think they might be pregnant. There is a potential for serious side effects to an unborn child. Talk to your health care professional or pharmacist for more information. Do not breast-feed an infant while taking this medicine. Call your doctor or health care professional for advice if you get a fever, chills or sore throat, or other symptoms of a cold or flu. Do not treat yourself. Try to avoid being around people who are sick. You may experience fever, chills, and headache during the infusion. Report any side effects during the infusion to your health care professional. What side effects may I notice from receiving this medicine? Side effects that you should report to your doctor or health care professional as soon as possible: -breathing problems -chest pain or palpitations -dizziness -feeling faint or lightheaded -fever or chills -skin rash, itching or hives -sore throat -swelling of the face, lips, or tongue -swelling of the legs or ankles -unusually weak or tired Side effects that usually do not require medical attention (Report these to your doctor or health care professional if they continue or  are bothersome.): -diarrhea -hair loss -nausea, vomiting -tiredness This list may not describe all possible side effects. Call your doctor for medical advice about side effects. You may report side effects to FDA at  1-800-FDA-1088. Where should I keep my medicine? This drug is given in a hospital or clinic and will not be stored at home. NOTE: This sheet is a summary. It may not cover all possible information. If you have questions about this medicine, talk to your doctor, pharmacist, or health care provider.  2015, Elsevier/Gold Standard. (2012-08-07 16:54:15) Ado-Trastuzumab Emtansine for injection What is this medicine? Ado-trastuzumab emtansine (ADD oh traz TOO zuh mab em TAN zine) is a monoclonal antibody combined with chemotherapy. It targets a protein called HER2. This protein is found in some stomach and breast cancers. This medicine can stop cancer cell growth. This medicine may be used for other purposes; ask your health care provider or pharmacist if you have questions. COMMON BRAND NAME(S): Kadcyla What should I tell my health care provider before I take this medicine? They need to know if you have any of these conditions: -heart disease -heart failure -infection (especially a virus infection such as chickenpox, cold sores, or herpes) -liver disease -lung or breathing disease, like asthma -an unusual or allergic reaction to ado-trastuzumab emtansine, other medications, foods, dyes, or preservatives -pregnant or trying to get pregnant -breast-feeding How should I use this medicine? This medicine is for infusion into a vein. It is given by a health care professional in a hospital or clinic setting. Talk to your pediatrician regarding the use of this medicine in children. Special care may be needed. Overdosage: If you think you've taken too much of this medicine contact a poison control center or emergency room at once. Overdosage: If you think you have taken too much of this medicine contact a poison control center or emergency room at once. NOTE: This medicine is only for you. Do not share this medicine with others. What if I miss a dose? It is important not to miss your dose. Call your  doctor or health care professional if you are unable to keep an appointment. What may interact with this medicine? This medicine may also interact with the following medications: -atazanavir -boceprevir -clarithromycin -delavirdine -indinavir -dalfopristin; quinupristin -isoniazid, INH -itraconazole -ketoconazole -nefazodone -nelfinavir -ritonavir -telaprevir -telithromycin -tipranavir -voriconazole This list may not describe all possible interactions. Give your health care provider a list of all the medicines, herbs, non-prescription drugs, or dietary supplements you use. Also tell them if you smoke, drink alcohol, or use illegal drugs. Some items may interact with your medicine. What should I watch for while using this medicine? Visit your doctor for checks on your progress. This drug may make you feel generally unwell. This is not uncommon, as chemotherapy can affect healthy cells as well as cancer cells. Report any side effects. Continue your course of treatment even though you feel ill unless your doctor tells you to stop. Call your doctor or health care professional for advice if you get a fever, chills or sore throat, or other symptoms of a cold or flu. Do not treat yourself. This drug decreases your body's ability to fight infections. Try to avoid being around people who are sick. Be careful brushing and flossing your teeth or using a toothpick because you may get an infection or bleed more easily. If you have any dental work done, tell your dentist you are receiving this medicine. Avoid taking products that contain aspirin, acetaminophen, ibuprofen, naproxen, or  ketoprofen unless instructed by your doctor. These medicines may hide a fever. Do not become pregnant while taking this medicine. Women should inform their doctor if they wish to become pregnant or think they might be pregnant. There is a potential for serious side effects to an unborn child. [Men should inform their doctors  if they wish to father a child. This medicine may lower sperm counts.] Talk to your health care professional or pharmacist for more information. Do not breast-feed an infant while taking this medicine. You may need blood work done while you are taking this medicine. What side effects may I notice from receiving this medicine? Side effects that you should report to your doctor or health care professional as soon as possible: -allergic reactions like skin rash, itching or hives, swelling of the face, lips, or tongue -breathing problems -chest pain or palpitations -fever or chills, sore throat -general ill feeling or flu-like symptoms -light-colored stools -nausea, vomiting -pain, tingling, numbness in the hands or feet -signs and symptoms of bleeding such as bloody or black, tarry stools; red or dark-brown urine; spitting up blood or brown material that looks like coffee grounds; red spots on the skin; unusual bruising or bleeding from the eye, gums, or nose -swelling of the legs or ankles -yellowing of the eyes or skin Side effects that usually do not require medical attention (Report these to your doctor or health care professional if they continue or are bothersome.): -changes in taste -constipation -dizziness -headache -joint pain -muscle pain -trouble sleeping -unusually weak or tired This list may not describe all possible side effects. Call your doctor for medical advice about side effects. You may report side effects to FDA at 1-800-FDA-1088. Where should I keep my medicine? This drug is given in a hospital or clinic and will not be stored at home. NOTE: This sheet is a summary. It may not cover all possible information. If you have questions about this medicine, talk to your doctor, pharmacist, or health care provider.  2015, Elsevier/Gold Standard. (2013-02-18 12:55:10)  

## 2015-03-31 ENCOUNTER — Encounter: Payer: Self-pay | Admitting: *Deleted

## 2015-03-31 ENCOUNTER — Telehealth: Payer: Self-pay | Admitting: Hematology & Oncology

## 2015-03-31 LAB — CANCER ANTIGEN 27.29: CA 27.29: 28 U/mL (ref 0–39)

## 2015-03-31 LAB — LACTATE DEHYDROGENASE: LDH: 143 U/L (ref 94–250)

## 2015-03-31 NOTE — Telephone Encounter (Signed)
[facility] MESS REGARDING APPTS IN July PET AND F/U VISIT

## 2015-04-02 ENCOUNTER — Telehealth: Payer: Self-pay | Admitting: Hematology & Oncology

## 2015-04-02 NOTE — Telephone Encounter (Signed)
[facility] has APPROVED Physical Therapy; in the home, per diem. Number of units: 6 Dates: 03/22/2015 to 04/22/2015   Ref: [id number]       COPY SCANNED

## 2015-04-13 ENCOUNTER — Telehealth: Payer: Self-pay | Admitting: *Deleted

## 2015-04-13 ENCOUNTER — Other Ambulatory Visit: Payer: 59

## 2015-04-13 ENCOUNTER — Ambulatory Visit: Payer: 59

## 2015-04-13 ENCOUNTER — Ambulatory Visit: Payer: 59 | Admitting: Hematology & Oncology

## 2015-04-13 NOTE — Telephone Encounter (Signed)
Bianca Kent c/o dry cough. She has no other symptoms. Dr [name] does not feel that at this time any follow up or testing is necessary. Communicated with Bianca Kent to call office back if symptoms worsen or other symptoms develop. Bianca Kent is agreeable to plan.

## 2015-04-15 ENCOUNTER — Ambulatory Visit: Payer: 59

## 2015-04-15 ENCOUNTER — Other Ambulatory Visit: Payer: 59

## 2015-04-15 ENCOUNTER — Ambulatory Visit: Payer: 59 | Admitting: Hematology & Oncology

## 2015-04-19 ENCOUNTER — Ambulatory Visit (HOSPITAL_COMMUNITY)
Admission: RE | Admit: 2015-04-19 | Discharge: 2015-04-19 | Disposition: A | Discharge status: Home / Self Care | Payer: 59 | Source: Ambulatory Visit | Attending: Hematology & Oncology | Admitting: Hematology & Oncology

## 2015-04-19 DIAGNOSIS — C7951 Secondary malignant neoplasm of bone: Secondary | ICD-10-CM | POA: Diagnosis not present

## 2015-04-19 DIAGNOSIS — Z79899 Other long term (current) drug therapy: Secondary | ICD-10-CM | POA: Insufficient documentation

## 2015-04-19 DIAGNOSIS — K117 Disturbances of salivary secretion: Secondary | ICD-10-CM

## 2015-04-19 DIAGNOSIS — C50911 Malignant neoplasm of unspecified site of right female breast: Secondary | ICD-10-CM | POA: Insufficient documentation

## 2015-04-19 DIAGNOSIS — R937 Abnormal findings on diagnostic imaging of other parts of musculoskeletal system: Secondary | ICD-10-CM | POA: Insufficient documentation

## 2015-04-19 DIAGNOSIS — M359 Systemic involvement of connective tissue, unspecified: Secondary | ICD-10-CM

## 2015-04-19 LAB — GLUCOSE, CAPILLARY: GLUCOSE-CAPILLARY: 97 mg/dL (ref 65–99)

## 2015-04-19 MED ORDER — FLUDEOXYGLUCOSE F - 18 (FDG) INJECTION
8.3500 | Freq: Once | INTRAVENOUS | Status: AC | PRN
  Administered 2015-04-19: 8.35 via INTRAVENOUS

## 2015-04-21 ENCOUNTER — Ambulatory Visit (HOSPITAL_BASED_OUTPATIENT_CLINIC_OR_DEPARTMENT_OTHER): Payer: 59

## 2015-04-21 ENCOUNTER — Encounter: Payer: Self-pay | Admitting: Hematology & Oncology

## 2015-04-21 ENCOUNTER — Other Ambulatory Visit (HOSPITAL_BASED_OUTPATIENT_CLINIC_OR_DEPARTMENT_OTHER): Payer: 59

## 2015-04-21 ENCOUNTER — Ambulatory Visit (HOSPITAL_BASED_OUTPATIENT_CLINIC_OR_DEPARTMENT_OTHER): Payer: 59 | Admitting: Hematology & Oncology

## 2015-04-21 VITALS — BP 116/78 | HR 88 | Temp 97.5°F | Resp 18 | Ht 67.0 in | Wt 156.8 lb

## 2015-04-21 DIAGNOSIS — Z5112 Encounter for antineoplastic immunotherapy: Secondary | ICD-10-CM | POA: Diagnosis not present

## 2015-04-21 DIAGNOSIS — C7931 Secondary malignant neoplasm of brain: Secondary | ICD-10-CM

## 2015-04-21 DIAGNOSIS — C7951 Secondary malignant neoplasm of bone: Secondary | ICD-10-CM

## 2015-04-21 DIAGNOSIS — C50811 Malignant neoplasm of overlapping sites of right female breast: Secondary | ICD-10-CM

## 2015-04-21 DIAGNOSIS — C773 Secondary and unspecified malignant neoplasm of axilla and upper limb lymph nodes: Secondary | ICD-10-CM | POA: Diagnosis not present

## 2015-04-21 DIAGNOSIS — K117 Disturbances of salivary secretion: Secondary | ICD-10-CM

## 2015-04-21 DIAGNOSIS — M359 Systemic involvement of connective tissue, unspecified: Principal | ICD-10-CM

## 2015-04-21 DIAGNOSIS — R112 Nausea with vomiting, unspecified: Secondary | ICD-10-CM

## 2015-04-21 DIAGNOSIS — C50911 Malignant neoplasm of unspecified site of right female breast: Secondary | ICD-10-CM

## 2015-04-21 LAB — CBC WITH DIFFERENTIAL (CANCER CENTER ONLY)
BASO#: 0 10*3/uL (ref 0.0–0.2)
BASO%: 0.5 % (ref 0.0–2.0)
EOS%: 3.1 % (ref 0.0–7.0)
Eosinophils Absolute: 0.2 10*3/uL (ref 0.0–0.5)
HEMATOCRIT: 40.1 % (ref 34.8–46.6)
HEMOGLOBIN: 13.5 g/dL (ref 11.6–15.9)
LYMPH#: 1.2 10*3/uL (ref 0.9–3.3)
LYMPH%: 21.8 % (ref 14.0–48.0)
MCH: 29.1 pg (ref 26.0–34.0)
MCHC: 33.7 g/dL (ref 32.0–36.0)
MCV: 86 fL (ref 81–101)
MONO#: 0.5 10*3/uL (ref 0.1–0.9)
MONO%: 8.5 % (ref 0.0–13.0)
NEUT%: 66.1 % (ref 39.6–80.0)
NEUTROS ABS: 3.6 10*3/uL (ref 1.5–6.5)
PLATELETS: 122 10*3/uL — AB (ref 145–400)
RBC: 4.64 10*6/uL (ref 3.70–5.32)
RDW: 16.1 % — AB (ref 11.1–15.7)
WBC: 5.5 10*3/uL (ref 3.9–10.0)

## 2015-04-21 LAB — CMP (CANCER CENTER ONLY)
ALBUMIN: 3.5 g/dL (ref 3.3–5.5)
ALT(SGPT): 35 U/L (ref 10–47)
AST: 41 U/L — AB (ref 11–38)
Alkaline Phosphatase: 161 U/L — ABNORMAL HIGH (ref 26–84)
BILIRUBIN TOTAL: 0.7 mg/dL (ref 0.20–1.60)
BUN, Bld: 14 mg/dL (ref 7–22)
CO2: 26 mEq/L (ref 18–33)
Calcium: 9.1 mg/dL (ref 8.0–10.3)
Chloride: 103 mEq/L (ref 98–108)
Creat: 0.7 mg/dl (ref 0.6–1.2)
Glucose, Bld: 93 mg/dL (ref 73–118)
POTASSIUM: 3.7 meq/L (ref 3.3–4.7)
SODIUM: 137 meq/L (ref 128–145)
Total Protein: 8.2 g/dL — ABNORMAL HIGH (ref 6.4–8.1)

## 2015-04-21 LAB — LACTATE DEHYDROGENASE: LDH: 146 U/L (ref 94–250)

## 2015-04-21 LAB — CHCC SATELLITE - SMEAR

## 2015-04-21 LAB — IRON AND TIBC CHCC
%SAT: 28 % (ref 21–57)
IRON: 68 ug/dL (ref 41–142)
TIBC: 244 ug/dL (ref 236–444)
UIBC: 176 ug/dL (ref 120–384)

## 2015-04-21 LAB — FERRITIN CHCC: FERRITIN: 160 ng/mL (ref 9–269)

## 2015-04-21 MED ORDER — DIPHENHYDRAMINE HCL 25 MG PO CAPS
ORAL_CAPSULE | ORAL | Status: AC
  Filled 2015-04-21: qty 2

## 2015-04-21 MED ORDER — ACETAMINOPHEN 325 MG PO TABS
650.0000 mg | ORAL_TABLET | Freq: Once | ORAL | Status: AC
  Administered 2015-04-21: 650 mg via ORAL

## 2015-04-21 MED ORDER — ADO-TRASTUZUMAB EMTANSINE CHEMO INJECTION 160 MG
160.0000 mg | Freq: Once | INTRAVENOUS | Status: AC
  Administered 2015-04-21: 160 mg via INTRAVENOUS
  Filled 2015-04-21: qty 8

## 2015-04-21 MED ORDER — SODIUM CHLORIDE 0.9 % IV SOLN
Freq: Once | INTRAVENOUS | Status: AC
  Administered 2015-04-21: 10:00:00 via INTRAVENOUS

## 2015-04-21 MED ORDER — DIPHENHYDRAMINE HCL 25 MG PO CAPS
50.0000 mg | ORAL_CAPSULE | Freq: Once | ORAL | Status: AC
  Administered 2015-04-21: 50 mg via ORAL

## 2015-04-21 MED ORDER — PALONOSETRON HCL INJECTION 0.25 MG/5ML
INTRAVENOUS | Status: AC
  Filled 2015-04-21: qty 5

## 2015-04-21 MED ORDER — ACETAMINOPHEN 325 MG PO TABS
ORAL_TABLET | ORAL | Status: AC
  Filled 2015-04-21: qty 2

## 2015-04-21 MED ORDER — SODIUM CHLORIDE 0.9 % IJ SOLN
10.0000 mL | INTRAMUSCULAR | Status: DC | PRN
  Administered 2015-04-21: 10 mL
  Filled 2015-04-21: qty 10

## 2015-04-21 MED ORDER — HEPARIN SOD (PORK) LOCK FLUSH 100 UNIT/ML IV SOLN
500.0000 [IU] | Freq: Once | INTRAVENOUS | Status: AC | PRN
  Administered 2015-04-21: 500 [IU]
  Filled 2015-04-21: qty 5

## 2015-04-21 MED ORDER — FENTANYL 50 MCG/HR TD PT72: 50.0000 ug | MEDICATED_PATCH | TRANSDERMAL | Status: DC

## 2015-04-21 MED ORDER — SODIUM CHLORIDE 0.9 % IV SOLN
336.0000 mg | Freq: Once | INTRAVENOUS | Status: AC
  Administered 2015-04-21: 330 mg via INTRAVENOUS
  Filled 2015-04-21: qty 11

## 2015-04-21 MED ORDER — HYDROMORPHONE HCL 4 MG PO TABS: 4.0000 mg | ORAL_TABLET | ORAL | Status: DC | PRN

## 2015-04-21 MED ORDER — FOSAPREPITANT DIMEGLUMINE INJECTION 150 MG
150.0000 mg | Freq: Once | INTRAVENOUS | Status: AC
  Administered 2015-04-21: 150 mg via INTRAVENOUS
  Filled 2015-04-21: qty 5

## 2015-04-21 MED ORDER — PALONOSETRON HCL INJECTION 0.25 MG/5ML
0.2500 mg | Freq: Once | INTRAVENOUS | Status: AC
  Administered 2015-04-21: 0.25 mg via INTRAVENOUS

## 2015-04-21 MED ORDER — ZOLEDRONIC ACID 4 MG/5ML IV CONC
4.0000 mg | Freq: Once | INTRAVENOUS | Status: AC
Start: 2015-04-21 — End: 2015-04-21
  Administered 2015-04-21: 4 mg via INTRAVENOUS
  Filled 2015-04-21: qty 5

## 2015-04-21 NOTE — Progress Notes (Signed)
Hematology and Oncology Follow Up Visit  Bianca Kent [id number] 1966/09/01 49 y.o. 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 is back for followup . She continues to do better. She had a hospitalization in which she had a very difficult recovery from surgery for her sinuses. She had aspiration area in her she still is on oxygen. She's having a hard time with the tank. I wrote her a prescription for a new more portable take to see if this does not allow her to more independence.  We did do a PET scan on her. This showed no active disease outside of a area at T3. The radiologist not sure exactly as to what this represented.  She is eating well. The Salagen is helping her mouth. Her tongue still gets a little sore.  She's had no problem with bowels or bladder.  She wants to go to a sister's house in [place]. She wants to do this over July 4 weekend. I told that she could do this. From my point of view, I don't think she'll have any problems.  She does not have a cough. She is not having a lot of nausea or vomiting. I think the olanzapine is helping her. She also is on some Reglan.  Overall, her pain control sees her doing pretty well with the fentanyl patch and Dilaudid  Overall, her performance status is ECOG 1.   Medications:  Current outpatient prescriptions:  .  albuterol (PROVENTIL HFA;VENTOLIN HFA) 108 (90 BASE) MCG/ACT inhaler, Inhale 2 puffs into the lungs every 4 (four) hours as needed for wheezing or shortness of breath. (Patient not taking: Reported on 02/25/2015), Disp: 2 Inhaler, Rfl: 6 .  ALPRAZolam (XANAX) 1 MG tablet, Take 1 tablet (1 mg total) by mouth at bedtime as needed for  anxiety., Disp: 30 tablet, Rfl: 2 .  aspirin (ASPIRIN EC) 81 MG EC tablet, Take 81 mg by mouth daily. Swallow whole., Disp: , Rfl:  .  Bismuth Subsalicylate (PEPTO-BISMOL PO), Take 1 tablet by mouth as needed (upset stomach). , Disp: , Rfl:  .  calcium carbonate (TUMS - DOSED IN MG ELEMENTAL CALCIUM) 500 MG chewable tablet, Chew 2 tablets by mouth 2 (two) times daily., Disp: , Rfl:  .  diphenhydrAMINE (SOMINEX) 25 MG tablet, Take 100 mg by mouth at bedtime as needed for sleep. Pt states she is taking up to 200 mg Benadryl at night and still not sleeping, Disp: , Rfl:  .  docusate sodium (COLACE) 100 MG capsule, Take 1 capsule (100 mg total) by mouth 2 (two) times daily as needed for mild constipation or moderate constipation., Disp: 10 capsule, Rfl: 0 .  dronabinol (MARINOL) 10 MG capsule, Take 1 capsule (10 mg total) by mouth 3 (three) times daily before meals., Disp: 90 capsule, Rfl: 0 .  ergocalciferol (VITAMIN D2) 50000 UNITS capsule, Take 1 capsule (50,000 Units total) by mouth once a week., Disp: 4 capsule, Rfl: 6 .  fentaNYL (DURAGESIC - DOSED MCG/HR) 50 MCG/HR, Place 1 patch (50 mcg total) onto the skin every 3 (three) days., Disp: 10 patch, Rfl: 0 .  HYDROmorphone (DILAUDID) 4 MG tablet, Take 1 tablet (4 mg total) by  mouth every 4 (four) hours as needed for severe pain., Disp: 90 tablet, Rfl: 0 .  ibuprofen (ADVIL,MOTRIN) 600 MG tablet, Take 1 tablet (600 mg total) by mouth every 8 (eight) hours as needed for moderate pain., Disp: 30 tablet, Rfl: 0 .  Lactulose SOLN, Take 2 TBLSP every 6 hours until bowel movement (Patient not taking: Reported on 02/25/2015), Disp: 1000 mL, Rfl: 4 .  lidocaine (LIDODERM) 5 %, Place 1 patch onto the skin daily. Remove & Discard patch within 12 hours or as directed by MD, Disp: 20 patch, Rfl: 0 .  lidocaine (XYLOCAINE) 2 % solution, Use as directed 20 mLs in the mouth or throat as needed for mouth pain., Disp: 200 mL, Rfl: 2 .  loratadine-pseudoephedrine  (CLARITIN-D 24-HOUR) 10-240 MG per 24 hr tablet, Take 1 tablet by mouth daily., Disp: , Rfl:  .  methylphenidate (RITALIN) 10 MG tablet, Take 1 tablet (10 mg total) by mouth 2 (two) times daily., Disp: 60 tablet, Rfl: 0 .  metoCLOPramide (REGLAN) 10 MG tablet, Take 1 tablet (10 mg total) by mouth every 6 (six) hours as needed for nausea., Disp: 120 tablet, Rfl: 5 .  Multiple Vitamin (MULTIVITAMIN) tablet, Take 1 tablet by mouth daily., Disp: , Rfl:  .  NONFORMULARY OR COMPOUNDED ITEM, Take 5 mLs by mouth as needed (irritation). DUKES MOUTHWASH  SWISH AND SWALLOW, Disp: , Rfl:  .  OLANZapine (ZYPREXA) 5 MG tablet, Take 1 tablet (5 mg total) by mouth at bedtime., Disp: 30 tablet, Rfl: 3 .  ondansetron (ZOFRAN) 8 MG tablet, TAKE 1 TABLET BY MOUTH EVERY 8 HOURS AS NEEDED FOR NAUSEA OR VOMITING, Disp: 20 tablet, Rfl: 2 .  pantoprazole (PROTONIX) 40 MG tablet, Take 1 tablet (40 mg total) by mouth daily., Disp: 30 tablet, Rfl: 6 .  pilocarpine (SALAGEN) 5 MG tablet, Take 1 tablet (5 mg total) by mouth 2 (two) times daily., Disp: 60 tablet, Rfl: 3 .  prochlorperazine (COMPAZINE) 10 MG tablet, Take 1 tablet (10 mg total) by mouth every 6 (six) hours as needed for nausea or vomiting., Disp: 60 tablet, Rfl: 2 .  promethazine (PHENERGAN) 25 MG suppository, Place 1 suppository (25 mg total) rectally every 6 (six) hours as needed for nausea or vomiting., Disp: 30 each, Rfl: 3 .  pyridoxine (B-6) 250 MG tablet, Take 1 tablet (250 mg total) by mouth daily. (Patient not taking: Reported on 02/25/2015), Disp: 90 tablet, Rfl: 4 .  traMADol (ULTRAM) 50 MG tablet, TAKE 1 TO 2 TABLETS BY MOUTH EVERY 6 HOURS AS NEEDED FOR PAIN., Disp: , Rfl: 0 .  traZODone (DESYREL) 100 MG tablet, Take 1-2 if needed at bed for sleep. (Patient taking differently: Take 100-200 mg by mouth at bedtime as needed for sleep. Take 1-2 if needed at bed for sleep.), Disp: 30 tablet, Rfl: 2 No current facility-administered medications for this  visit.  Facility-Administered Medications Ordered in Other Visits:  .  sodium chloride 0.9 % injection 10 mL, 10 mL, Intracatheter, PRN, [name], MD, 10 mL at 04/21/15 1254  Allergies:  Allergies  Allergen Reactions  . Iohexol      Code: RASH, Desc: VERY STRONG FAMILY HX OF ANGIOEDEMA WHEN RECEIVING IV CONTRAST; PT HAS BEEN PREMEDICATED FOR OTHER CONTRASTED STUDIES(IN CATH. LAB)  KR, Onset Date: [id number]   . Prednisone Itching    Capillary beds bust  . Tetanus Toxoids Other (See Comments)    Ran a high fever for 48 hours  . Theophyllines Hives  Mental changes  . Versed [Midazolam] Other (See Comments)    Pt becomes violent    Past Medical History, Surgical history, Social history, and Family History were reviewed and updated.  Review of Systems: As above  Physical Exam:  height is 5\' 7"  (1.702 m) and weight is 156 lb 12.8 oz (71.124 kg). Her oral temperature is 97.5 F (36.4 C). Her blood pressure is 116/78 and her pulse is 88. Her respiration is 18.   Well-developed and well-nourished white female. Head and exam shows no ocular or oral lesions. She's able to move both eyes well. She has good extraocular muscle . There is no erythema or exudate from the left eye. There might be some slight fullness in the right axilla by do not detect any obvious adenopathy. I cannot detect any obvious right supraclavicular lymph nodes. She has improved range of motion of the right shoulder. Left supraclavicular region is okay. No masses are noted. Lungs are clear. Cardiac exam regular rate and rhythm with no murmurs, rubs or bruits.. Abdomen is soft. She has good bowel sounds. There is no palpable liver or spleen tip. Back exam shows decreased tenderness over the lumbar and thoracic spine to palpation. Extremities shows no clubbing, cyanosis or edema. She has 4/5 strength in her legs. Skin exam shows improvement in the radiation dermatitis of the right breast and axilla. There is no open  wound. There is some hyper pigmentation. Neurological exam is nonfocal.    Lab Results  Component Value Date   WBC 5.5 04/21/2015   HGB 13.5 04/21/2015   HCT 40.1 04/21/2015   MCV 86 04/21/2015   PLT 122* 04/21/2015     Chemistry      Component Value Date/Time   NA 137 04/21/2015 0815   NA 138 03/19/2015 0312   K 3.7 04/21/2015 0815   K 3.8 03/19/2015 0312   CL 103 04/21/2015 0815   CL 101 03/19/2015 0312   CO2 26 04/21/2015 0815   CO2 29 03/19/2015 0312   BUN 14 04/21/2015 0815   BUN 7 03/19/2015 0312   CREATININE 0.7 04/21/2015 0815   CREATININE 0.75 03/19/2015 0312      Component Value Date/Time   CALCIUM 9.1 04/21/2015 0815   CALCIUM 8.7* 03/19/2015 0312   ALKPHOS 161* 04/21/2015 0815   ALKPHOS 238* 03/06/2015 0425   AST 41* 04/21/2015 0815   AST 41 03/06/2015 0425   ALT 35 04/21/2015 0815   ALT 38 03/06/2015 0425   BILITOT 0.70 04/21/2015 0815   BILITOT 0.4 03/06/2015 0425         Impression and Plan: Ms. Senseney is 49 year old white female with triple positive metastatic breast cancer. We now have her on Kadcyla. So far, she is doing well. I think the PET scan is very encouraging. I'm not sure what to make of this solitary hypermetabolic focus at T3. We will just have to watch this. She is not symptomatic at all in that area.   I'm glad that she was able to enjoy her 49th birthday.  As long as she is doing this well, we will continue her on treatment.  I forgot to mention that she is on Salagen. This is helping with her mouth and tongue.   I spent about 35 minutes with she and her sister. As always, we had a good prayer session.  I will plan to see her back in another 3 weeks.  [name], MD 6/29/20161:11 PM

## 2015-04-21 NOTE — Patient Instructions (Signed)
[facility] Discharge Instructions for Patients Receiving Chemotherapy  Today you received the following chemotherapy agents Herceptin, Perjeta  To help prevent nausea and vomiting after your treatment, we encourage you to take your nausea medication    If you develop nausea and vomiting that is not controlled by your nausea medication, call the clinic.   BELOW ARE SYMPTOMS THAT SHOULD BE REPORTED IMMEDIATELY:  *FEVER GREATER THAN 100.5 F  *CHILLS WITH OR WITHOUT FEVER  NAUSEA AND VOMITING THAT IS NOT CONTROLLED WITH YOUR NAUSEA MEDICATION  *UNUSUAL SHORTNESS OF BREATH  *UNUSUAL BRUISING OR BLEEDING  TENDERNESS IN MOUTH AND THROAT WITH OR WITHOUT PRESENCE OF ULCERS  *URINARY PROBLEMS  *BOWEL PROBLEMS  UNUSUAL RASH Items with * indicate a potential emergency and should be followed up as soon as possible.  Feel free to call the clinic you have any questions or concerns. The clinic phone number is [phone].  Please show the [facility] at check to the Emergency Department and triage nurse.

## 2015-04-22 ENCOUNTER — Other Ambulatory Visit: Payer: Self-pay

## 2015-04-22 DIAGNOSIS — C50911 Malignant neoplasm of unspecified site of right female breast: Secondary | ICD-10-CM

## 2015-04-22 LAB — CANCER ANTIGEN 27.29: CA 27.29: 22 U/mL (ref 0–39)

## 2015-04-22 LAB — VITAMIN B12: VITAMIN B 12: 528 pg/mL (ref 211–911)

## 2015-04-22 LAB — VITAMIN D 25 HYDROXY (VIT D DEFICIENCY, FRACTURES): VIT D 25 HYDROXY: 22 ng/mL — AB (ref 30–100)

## 2015-04-27 ENCOUNTER — Telehealth: Payer: Self-pay | Admitting: *Deleted

## 2015-04-27 NOTE — Telephone Encounter (Signed)
Patient stating that her left eye is swollen. This is the same eye in which she had surgery on a few months ago. Her sister states that she can visibly see the swelling. Spoke to Dr [name] and he wants patient to call her surgeon today. Spoke to patient and she will call.

## 2015-04-29 ENCOUNTER — Telehealth: Payer: Self-pay | Admitting: Hematology & Oncology

## 2015-04-29 NOTE — Telephone Encounter (Signed)
Faxed [employer] DISABILITY papers today to:  Policy: [id number] Claim: [id number]  Valid: 02/21/2015 - present    F: [phone] P: [phone]    COPY SCANNED

## 2015-04-30 ENCOUNTER — Ambulatory Visit
Admission: RE | Admit: 2015-04-30 | Discharge: 2015-04-30 | Disposition: A | Discharge status: Home / Self Care | Payer: 59 | Source: Ambulatory Visit | Attending: Radiation Oncology | Admitting: Radiation Oncology

## 2015-04-30 ENCOUNTER — Other Ambulatory Visit (HOSPITAL_BASED_OUTPATIENT_CLINIC_OR_DEPARTMENT_OTHER): Payer: 59

## 2015-04-30 ENCOUNTER — Ambulatory Visit (HOSPITAL_BASED_OUTPATIENT_CLINIC_OR_DEPARTMENT_OTHER): Payer: 59

## 2015-04-30 DIAGNOSIS — C50811 Malignant neoplasm of overlapping sites of right female breast: Secondary | ICD-10-CM

## 2015-04-30 DIAGNOSIS — C7931 Secondary malignant neoplasm of brain: Secondary | ICD-10-CM

## 2015-04-30 DIAGNOSIS — C50911 Malignant neoplasm of unspecified site of right female breast: Secondary | ICD-10-CM

## 2015-04-30 DIAGNOSIS — R112 Nausea with vomiting, unspecified: Secondary | ICD-10-CM

## 2015-04-30 DIAGNOSIS — Z95828 Presence of other vascular implants and grafts: Secondary | ICD-10-CM

## 2015-04-30 LAB — COMPREHENSIVE METABOLIC PANEL (CC13)
ALBUMIN: 3.2 g/dL — AB (ref 3.5–5.0)
ALK PHOS: 257 U/L — AB (ref 40–150)
ALT: 41 U/L (ref 0–55)
AST: 57 U/L — ABNORMAL HIGH (ref 5–34)
Anion Gap: 8 mEq/L (ref 3–11)
BUN: 9.7 mg/dL (ref 7.0–26.0)
CALCIUM: 9.7 mg/dL (ref 8.4–10.4)
CHLORIDE: 102 meq/L (ref 98–109)
CO2: 29 meq/L (ref 22–29)
Creatinine: 0.8 mg/dL (ref 0.6–1.1)
GLUCOSE: 113 mg/dL (ref 70–140)
POTASSIUM: 3.5 meq/L (ref 3.5–5.1)
Sodium: 138 mEq/L (ref 136–145)
Total Bilirubin: 0.6 mg/dL (ref 0.20–1.20)
Total Protein: 7.8 g/dL (ref 6.4–8.3)

## 2015-04-30 LAB — CBC WITH DIFFERENTIAL/PLATELET
BASO%: 0.6 % (ref 0.0–2.0)
BASOS ABS: 0 10*3/uL (ref 0.0–0.1)
EOS%: 1.5 % (ref 0.0–7.0)
Eosinophils Absolute: 0.1 10*3/uL (ref 0.0–0.5)
HCT: 37.3 % (ref 34.8–46.6)
HEMOGLOBIN: 12.2 g/dL (ref 11.6–15.9)
LYMPH%: 11.8 % — AB (ref 14.0–49.7)
MCH: 28.4 pg (ref 25.1–34.0)
MCHC: 32.7 g/dL (ref 31.5–36.0)
MCV: 86.7 fL (ref 79.5–101.0)
MONO#: 0.5 10*3/uL (ref 0.1–0.9)
MONO%: 11.1 % (ref 0.0–14.0)
NEUT#: 3.6 10*3/uL (ref 1.5–6.5)
NEUT%: 75 % (ref 38.4–76.8)
PLATELETS: 85 10*3/uL — AB (ref 145–400)
RBC: 4.3 10*6/uL (ref 3.70–5.45)
RDW: 16.1 % — ABNORMAL HIGH (ref 11.2–14.5)
WBC: 4.8 10*3/uL (ref 3.9–10.3)
lymph#: 0.6 10*3/uL — ABNORMAL LOW (ref 0.9–3.3)

## 2015-04-30 MED ORDER — GADOBENATE DIMEGLUMINE 529 MG/ML IV SOLN
15.0000 mL | Freq: Once | INTRAVENOUS | Status: AC | PRN
  Administered 2015-04-30: 15 mL via INTRAVENOUS

## 2015-04-30 MED ORDER — SODIUM CHLORIDE 0.9 % IJ SOLN
10.0000 mL | INTRAMUSCULAR | Status: DC | PRN
  Administered 2015-04-30: 10 mL via INTRAVENOUS
  Filled 2015-04-30: qty 10

## 2015-04-30 MED ORDER — HEPARIN SOD (PORK) LOCK FLUSH 100 UNIT/ML IV SOLN
500.0000 [IU] | Freq: Once | INTRAVENOUS | Status: AC
  Administered 2015-04-30: 500 [IU] via INTRAVENOUS
  Filled 2015-04-30: qty 5

## 2015-04-30 NOTE — Progress Notes (Signed)
Spoke with Luiz Ochoa, MRI tech at [facility] states that they would like for pt to be left accessed for scheduled MRI and that she would be able to de-access pt PAC after MRI procedure.

## 2015-04-30 NOTE — Patient Instructions (Signed)

## 2015-05-01 ENCOUNTER — Other Ambulatory Visit: Payer: Self-pay | Admitting: Hematology & Oncology

## 2015-05-01 LAB — CANCER ANTIGEN 27.29: CA 27.29: 21 U/mL (ref 0–39)

## 2015-05-02 ENCOUNTER — Encounter: Payer: Self-pay | Admitting: Radiation Therapy

## 2015-05-03 ENCOUNTER — Encounter: Payer: Self-pay | Admitting: Radiation Oncology

## 2015-05-03 ENCOUNTER — Ambulatory Visit
Admission: RE | Admit: 2015-05-03 | Discharge: 2015-05-03 | Disposition: A | Discharge status: Home / Self Care | Payer: 59 | Source: Ambulatory Visit | Attending: Radiation Oncology | Admitting: Radiation Oncology

## 2015-05-03 VITALS — BP 104/75 | HR 76 | Temp 98.8°F | Resp 12 | Wt 152.5 lb

## 2015-05-03 DIAGNOSIS — C7931 Secondary malignant neoplasm of brain: Secondary | ICD-10-CM

## 2015-05-03 NOTE — Progress Notes (Signed)
Radiation Oncology         [phone] ________________________________  Name: Bianca Kent MRN: [id number]  Date: 05/03/2015  DOB: December 06, 1965  Follow-Up Visit Note  Outpatient  CC: [name], MD  [name], MD  Diagnosis and Prior Radiotherapy:    ICD-9-CM ICD-10-CM   1. Brain metastasis 198.3 C79.31      Brain metastasis, Grade III triple positive breast cancer   Indication for treatment:  palliative       Radiation treatment dates:   12/16/2014  Site/dose:   Left temporal 74mm tumor / 20 Gy in 1 fraction  Narrative:  The patient returns today for routine follow-up.   She has no new neurologic complaints.    She is recovering from PNA r/t sinus surgery.  She had a PET in June showing good disease control with Kadcyla.  She had an MRI of her brain last month with no active disease, reviewed at tumor board this AM.                           ALLERGIES:  is allergic to iohexol; prednisone; tetanus toxoids; theophyllines; and versed.  Meds: Current Outpatient Prescriptions  Medication Sig Dispense Refill  . albuterol (PROVENTIL HFA;VENTOLIN HFA) 108 (90 BASE) MCG/ACT inhaler Inhale 2 puffs into the lungs every 4 (four) hours as needed for wheezing or shortness of breath. (Patient not taking: Reported on 02/25/2015) 2 Inhaler 6  . ALPRAZolam (XANAX) 1 MG tablet Take 1 tablet (1 mg total) by mouth at bedtime as needed for anxiety. 30 tablet 2  . aspirin (ASPIRIN EC) 81 MG EC tablet Take 81 mg by mouth daily. Swallow whole.    . Bismuth Subsalicylate (PEPTO-BISMOL PO) Take 1 tablet by mouth as needed (upset stomach).     . calcium carbonate (TUMS - DOSED IN MG ELEMENTAL CALCIUM) 500 MG chewable tablet Chew 2 tablets by mouth 2 (two) times daily.    . diphenhydrAMINE (SOMINEX) 25 MG tablet Take 100 mg by mouth at bedtime as needed for sleep. Pt states she is taking up to 200 mg Benadryl at night and still not sleeping    . docusate sodium (COLACE) 100 MG capsule Take 1  capsule (100 mg total) by mouth 2 (two) times daily as needed for mild constipation or moderate constipation. 10 capsule 0  . dronabinol (MARINOL) 10 MG capsule Take 1 capsule (10 mg total) by mouth 3 (three) times daily before meals. 90 capsule 0  . ergocalciferol (VITAMIN D2) 50000 UNITS capsule Take 1 capsule (50,000 Units total) by mouth once a week. 4 capsule 6  . fentaNYL (DURAGESIC - DOSED MCG/HR) 50 MCG/HR Place 1 patch (50 mcg total) onto the skin every 3 (three) days. 10 patch 0  . HYDROmorphone (DILAUDID) 4 MG tablet Take 1 tablet (4 mg total) by mouth every 4 (four) hours as needed for severe pain. 90 tablet 0  . ibuprofen (ADVIL,MOTRIN) 600 MG tablet Take 1 tablet (600 mg total) by mouth every 8 (eight) hours as needed for moderate pain. 30 tablet 0  . Lactulose SOLN Take 2 TBLSP every 6 hours until bowel movement (Patient not taking: Reported on 02/25/2015) 1000 mL 4  . lidocaine (LIDODERM) 5 % Place 1 patch onto the skin daily. Remove & Discard patch within 12 hours or as directed by MD 20 patch 0  . lidocaine (XYLOCAINE) 2 % solution Use as directed 20 mLs in the mouth or throat  as needed for mouth pain. 200 mL 2  . loratadine-pseudoephedrine (CLARITIN-D 24-HOUR) 10-240 MG per 24 hr tablet Take 1 tablet by mouth daily.    . methylphenidate (RITALIN) 10 MG tablet Take 1 tablet (10 mg total) by mouth 2 (two) times daily. 60 tablet 0  . metoCLOPramide (REGLAN) 10 MG tablet Take 1 tablet (10 mg total) by mouth every 6 (six) hours as needed for nausea. 120 tablet 5  . Multiple Vitamin (MULTIVITAMIN) tablet Take 1 tablet by mouth daily.    . NONFORMULARY OR COMPOUNDED ITEM Take 5 mLs by mouth as needed (irritation). DUKES MOUTHWASH  SWISH AND SWALLOW    . OLANZapine (ZYPREXA) 5 MG tablet Take 1 tablet (5 mg total) by mouth at bedtime. 30 tablet 3  . ondansetron (ZOFRAN) 8 MG tablet TAKE 1 TABLET BY MOUTH EVERY 8 HOURS AS NEEDED FOR NAUSEA OR VOMITING 20 tablet 2  . pantoprazole (PROTONIX) 40  MG tablet Take 1 tablet (40 mg total) by mouth daily. 30 tablet 6  . pilocarpine (SALAGEN) 5 MG tablet Take 1 tablet (5 mg total) by mouth 2 (two) times daily. 60 tablet 3  . prochlorperazine (COMPAZINE) 10 MG tablet Take 1 tablet (10 mg total) by mouth every 6 (six) hours as needed for nausea or vomiting. 60 tablet 2  . promethazine (PHENERGAN) 25 MG suppository Place 1 suppository (25 mg total) rectally every 6 (six) hours as needed for nausea or vomiting. 30 each 3  . pyridoxine (B-6) 250 MG tablet Take 1 tablet (250 mg total) by mouth daily. (Patient not taking: Reported on 02/25/2015) 90 tablet 4  . traMADol (ULTRAM) 50 MG tablet TAKE 1 TO 2 TABLETS BY MOUTH EVERY 6 HOURS AS NEEDED FOR PAIN.  0  . traZODone (DESYREL) 100 MG tablet TAKE 1 TO 2 TABLETS BY MOUTH AS NEEDED FOR SLEEP 30 tablet 2   No current facility-administered medications for this encounter.    Physical Findings: The patient is in no acute distress. Patient is alert and oriented.  weight is 152 lb 8 oz (69.174 kg). Her oral temperature is 98.8 F (37.1 C). Her blood pressure is 104/75 and her pulse is 76. Her respiration is 12 and oxygen saturation is 96%. .    Neurologic exam is nonfocal and without deficits. Breathing O2 per [place].    Lab Findings: Lab Results  Component Value Date   WBC 4.8 04/30/2015   HGB 12.2 04/30/2015   HCT 37.3 04/30/2015   MCV 86.7 04/30/2015   PLT 85* 04/30/2015    Radiographic Findings: Mr [name] EP Contrast  04/30/2015   CLINICAL DATA:  Breast cancer.  Status post SRS.  Restaging.  EXAM: MRI HEAD WITHOUT AND WITH CONTRAST  TECHNIQUE: Multiplanar, multiecho pulse sequences of the brain and surrounding structures were obtained without and with intravenous contrast.  CONTRAST:  44mL MULTIHANCE GADOBENATE DIMEGLUMINE 529 MG/ML IV SOLN  COMPARISON:  MR brain 01/20/2015. MR brain 12/01/2014. MR brain 11/26/2014.  FINDINGS: No acute stroke, acute hemorrhage, hydrocephalus, or extra-axial fluid.  Premature for age atrophy. No significant white matter disease. No acute or chronic hemorrhage.  Post infusion, 2 mm residual focus of treated metastasis LEFT posterior temporal lobe is stable (image 72 series 10). No significant surrounding edema. No new lesions are evident.  Since the previous scan, the patient has undergone drainage of the LEFT frontoethmoid mucocele. At the time of surgery, there was some repair performed of the cribriform plate dehiscence. No intracranial complications from such. Residual  mass effect on the superomedial orbit on the LEFT. There is residual T2 hyperintense as well as enhancing soft tissue within the treated mucocele. Interval development of paranasal sinus disease of a chronic nature in the RIGHT frontal, RIGHT ethmoid, RIGHT division sphenoid, and RIGHT maxillary sinuses with mucosal thickening. Apparent prior BILATERAL antrectomies and ethmoidectomies. Followup ENT consultation may be warranted.  IMPRESSION: Stable 2 mm focus of enhancement LEFT temporal lobe consistent with treated metastasis. No new lesions are evident.  Recent LEFT frontoethmoid mucocele sinus surgery, resulting in cribriform plate dehiscence, without intracranial complications of such.  Interval development of frontoethmoid, sphenoid, and maxillary sinus disease on the RIGHT, with mucosal thickening.   Electronically Signed   By: Staci Righter M.D.   On: 04/30/2015 14:18   Nm Pet Image Restag (ps) Skull Base To Thigh  04/19/2015   CLINICAL DATA:  Subsequent treatment strategy for breast carcinoma. Chemotherapy ongoing.  EXAM: NUCLEAR MEDICINE PET SKULL BASE TO THIGH  TECHNIQUE: 8.5 mCi F-18 FDG was injected intravenously. Full-ring PET imaging was performed from the skull base to thigh after the radiotracer. CT data was obtained and used for attenuation correction and anatomic localization.  FASTING BLOOD GLUCOSE:  Value: 97 mg/dl  COMPARISON:  12/22/2014  FINDINGS: NECK  No hypermetabolic lymph nodes  in the neck.  CHEST  There is resolution of the hypermetabolic airspace disease in the right upper lobe. There is residual parenchymal pleural banding and linear scarring. This favors resolution of inflammatory/infectious process. No hypermetabolic axillary nodes. Small right axial lymph nodes not have significant metabolic activity. No hypermetabolic mediastinal nodes.  ABDOMEN/PELVIS  No abnormal hypermetabolic activity within the liver, pancreas, adrenal glands, or spleen. No hypermetabolic lymph nodes in the abdomen or pelvis.  SKELETON  Single new focus of uptake within the set or right neural arch of the T3 vertebral body (image 58 of the fused data set. Other sclerotic lesions within the spine do not have metabolic activity  IMPRESSION: 1. No clear evidence of breast cancer recurrence or metastasis. 2. Resolution metabolic activity in the right lung consists resolution inflammatory infectious process. 3. Single new focus of new metabolic activity within the posterior right T3 vertebral body. The remaining sclerotic metastasis do not have metabolic activity. Differential includes a new metastatic skeletal lesion versus inflammatory / degenerate uptake. Recommend attention on follow-up.   Electronically Signed   By: Suzy Bouchard M.D.   On: 04/19/2015 12:46    Impression/Plan: Doing well from a CNS standpoint. No active metastatic brain disease. F/u in 3 mo with repeat brain MRI, sooner if needed Nursing will contact Social Work as pt reports trouble w/ ADLs after recent hospital stay _____________________________________   Eppie Gibson, MD

## 2015-05-03 NOTE — Progress Notes (Signed)
PAIN: She rates her pain as a 3 on a scale of 0-10. constant, dull and aching over right shoulder. NEURO: Pt alert & oriented x 3 with fluent speech, gait normal, noted right hand weakness and left leg weakness.  PERRLA, No auditory or visual abnormalities. Pt presenting appropriate quality, quantity and organization of sentences. Pt reports occasional manageable headaches. OTHER: Pt complains of fatigue, weakness and loss of sleep. MRI done 04/30/15.  On 2L O2 via nasal canula- nasal surgery in May with complications of pneumonia.  Decadron? No. BP 104/75 mmHg  Pulse 76  Temp(Src) 98.8 F (37.1 C) (Oral)  Resp 12  Wt 152 lb 8 oz (69.174 kg)  SpO2 96%  LMP 04/22/2014 Wt Readings from Last 3 Encounters:  05/03/15 152 lb 8 oz (69.174 kg)  04/30/15 160 lb (72.576 kg)  04/21/15 156 lb 12.8 oz (71.124 kg)

## 2015-05-05 ENCOUNTER — Encounter: Payer: Self-pay | Admitting: *Deleted

## 2015-05-05 NOTE — Progress Notes (Signed)
[place] Work  Clinical Social Work was referred by Therapist, sports for assistance with ADL's after hospitalization.  CSW phoned pt at home. She reports her biggest concern is related to cancer side effects and emotional coping. She reports to have lots of help at home and is getting around ok right now. She has issues with eating and worried about her nutrition. Pt open to referral to dietician for assistance for help with foods that will give her the most benefit and not cause pain. Pt reports to have many issues with mouth sores and can't eat. CSW allowed pt safe space to process emotions and discuss concerns. Many of her issues are related to "living with cancer" and CSW really encouraged pt to attend Living with Cancer Support group, yoga , tai chi and Healing Arts programs. Pt agreed to consider and will try to attend. Pt appreciative of supportive listening and agrees to reach out as needs come up.   Clinical Social Work interventions: Research officer, political party education and referral  Loren Racer, [place] Worker [place]  [place] Phone: [phone] Fax: [phone]

## 2015-05-06 ENCOUNTER — Ambulatory Visit: Payer: 59

## 2015-05-06 ENCOUNTER — Other Ambulatory Visit: Payer: 59

## 2015-05-06 ENCOUNTER — Ambulatory Visit: Payer: 59 | Admitting: Hematology & Oncology

## 2015-05-07 ENCOUNTER — Other Ambulatory Visit: Payer: Self-pay

## 2015-05-07 DIAGNOSIS — C50911 Malignant neoplasm of unspecified site of right female breast: Secondary | ICD-10-CM

## 2015-05-18 ENCOUNTER — Ambulatory Visit: Payer: 59 | Admitting: Hematology & Oncology

## 2015-05-18 ENCOUNTER — Other Ambulatory Visit: Payer: 59

## 2015-05-18 ENCOUNTER — Telehealth: Payer: Self-pay | Admitting: Nutrition

## 2015-05-18 ENCOUNTER — Encounter: Payer: 59 | Admitting: Nutrition

## 2015-05-18 ENCOUNTER — Ambulatory Visit: Payer: 59

## 2015-05-18 NOTE — Telephone Encounter (Signed)
Referral received from Education officer, museum. Patient is a 49 year old female diagnosed with breast cancer with single brain metastases status post radiation therapy.  She is a patient of Dr. [name]. Current weight documented as 152.5 pounds July 11 decreased from usual body weight of 220 pounds. BMI currently 23.88 within normal limits. Patient's biggest complaint is mouth sores which limit her ability to take in adequate calories and protein. Patient does not tolerate milk or nutrition drinks.  She did find a protein shake she can drink occasionally. Other complaints include dry mouth and constipation.  Educated patient consume higher calorie, higher protein foods in 6-8 small meals and snacks daily. Recommended patient utilize the protein shake she does tolerate and make a milk shake for additional calories and protein. Recommended patient continue strategies for improving mouth sores including mouth rinses and medications. Will mail fact sheets to patient.  Questions were answered.  Teach back method was used.

## 2015-05-19 ENCOUNTER — Other Ambulatory Visit: Payer: Self-pay | Admitting: *Deleted

## 2015-05-19 DIAGNOSIS — C50911 Malignant neoplasm of unspecified site of right female breast: Secondary | ICD-10-CM

## 2015-05-20 ENCOUNTER — Ambulatory Visit (HOSPITAL_BASED_OUTPATIENT_CLINIC_OR_DEPARTMENT_OTHER): Payer: 59

## 2015-05-20 ENCOUNTER — Ambulatory Visit (HOSPITAL_BASED_OUTPATIENT_CLINIC_OR_DEPARTMENT_OTHER): Payer: 59 | Admitting: Hematology & Oncology

## 2015-05-20 ENCOUNTER — Other Ambulatory Visit: Payer: Self-pay | Admitting: Family

## 2015-05-20 ENCOUNTER — Encounter: Payer: Self-pay | Admitting: Hematology & Oncology

## 2015-05-20 ENCOUNTER — Other Ambulatory Visit (HOSPITAL_BASED_OUTPATIENT_CLINIC_OR_DEPARTMENT_OTHER): Payer: 59

## 2015-05-20 VITALS — BP 104/70 | HR 77 | Temp 98.0°F | Resp 16 | Ht 67.0 in | Wt 152.0 lb

## 2015-05-20 DIAGNOSIS — K5903 Drug induced constipation: Secondary | ICD-10-CM

## 2015-05-20 DIAGNOSIS — C50912 Malignant neoplasm of unspecified site of left female breast: Secondary | ICD-10-CM

## 2015-05-20 DIAGNOSIS — C50911 Malignant neoplasm of unspecified site of right female breast: Secondary | ICD-10-CM

## 2015-05-20 DIAGNOSIS — R1110 Vomiting, unspecified: Secondary | ICD-10-CM

## 2015-05-20 DIAGNOSIS — C7951 Secondary malignant neoplasm of bone: Secondary | ICD-10-CM | POA: Diagnosis not present

## 2015-05-20 DIAGNOSIS — D500 Iron deficiency anemia secondary to blood loss (chronic): Secondary | ICD-10-CM

## 2015-05-20 DIAGNOSIS — K5909 Other constipation: Secondary | ICD-10-CM

## 2015-05-20 DIAGNOSIS — C50919 Malignant neoplasm of unspecified site of unspecified female breast: Secondary | ICD-10-CM

## 2015-05-20 DIAGNOSIS — D510 Vitamin B12 deficiency anemia due to intrinsic factor deficiency: Secondary | ICD-10-CM

## 2015-05-20 LAB — CMP (CANCER CENTER ONLY)
ALT(SGPT): 33 U/L (ref 10–47)
AST: 47 U/L — ABNORMAL HIGH (ref 11–38)
Albumin: 3.9 g/dL (ref 3.3–5.5)
Alkaline Phosphatase: 176 U/L — ABNORMAL HIGH (ref 26–84)
BILIRUBIN TOTAL: 0.7 mg/dL (ref 0.20–1.60)
BUN, Bld: 11 mg/dL (ref 7–22)
CO2: 28 meq/L (ref 18–33)
CREATININE: 0.7 mg/dL (ref 0.6–1.2)
Calcium: 9.4 mg/dL (ref 8.0–10.3)
Chloride: 101 mEq/L (ref 98–108)
Glucose, Bld: 95 mg/dL (ref 73–118)
Potassium: 3.5 mEq/L (ref 3.3–4.7)
SODIUM: 131 meq/L (ref 128–145)
Total Protein: 8.2 g/dL — ABNORMAL HIGH (ref 6.4–8.1)

## 2015-05-20 LAB — CBC WITH DIFFERENTIAL (CANCER CENTER ONLY)
BASO#: 0 10*3/uL (ref 0.0–0.2)
BASO%: 0.7 % (ref 0.0–2.0)
EOS ABS: 0.1 10*3/uL (ref 0.0–0.5)
EOS%: 2.7 % (ref 0.0–7.0)
HCT: 39.9 % (ref 34.8–46.6)
HGB: 13.4 g/dL (ref 11.6–15.9)
LYMPH#: 0.9 10*3/uL (ref 0.9–3.3)
LYMPH%: 20.7 % (ref 14.0–48.0)
MCH: 28.6 pg (ref 26.0–34.0)
MCHC: 33.6 g/dL (ref 32.0–36.0)
MCV: 85 fL (ref 81–101)
MONO#: 0.3 10*3/uL (ref 0.1–0.9)
MONO%: 8 % (ref 0.0–13.0)
NEUT#: 2.8 10*3/uL (ref 1.5–6.5)
NEUT%: 67.9 % (ref 39.6–80.0)
Platelets: 100 10*3/uL — ABNORMAL LOW (ref 145–400)
RBC: 4.68 10*6/uL (ref 3.70–5.32)
RDW: 15 % (ref 11.1–15.7)
WBC: 4.2 10*3/uL (ref 3.9–10.0)

## 2015-05-20 MED ORDER — HEPARIN SOD (PORK) LOCK FLUSH 100 UNIT/ML IV SOLN
250.0000 [IU] | Freq: Once | INTRAVENOUS | Status: DC | PRN
Start: 2015-05-20 — End: 2015-05-20
  Filled 2015-05-20: qty 5

## 2015-05-20 MED ORDER — PROMETHAZINE HCL 25 MG/ML IJ SOLN
25.0000 mg | Freq: Once | INTRAMUSCULAR | Status: AC
Start: 2015-05-20 — End: 2015-05-20
  Administered 2015-05-20: 25 mg via INTRAVENOUS

## 2015-05-20 MED ORDER — NALOXEGOL OXALATE 25 MG PO TABS: 25.0000 mg | ORAL_TABLET | Freq: Every day | ORAL | Status: DC

## 2015-05-20 MED ORDER — HYDROMORPHONE HCL 1 MG/ML IJ SOLN
INTRAMUSCULAR | Status: AC
  Filled 2015-05-20: qty 2

## 2015-05-20 MED ORDER — SODIUM CHLORIDE 0.9 % IJ SOLN
3.0000 mL | Freq: Once | INTRAMUSCULAR | Status: DC | PRN
  Filled 2015-05-20: qty 10

## 2015-05-20 MED ORDER — PROMETHAZINE HCL 25 MG/ML IJ SOLN
INTRAMUSCULAR | Status: AC
  Filled 2015-05-20: qty 1

## 2015-05-20 MED ORDER — ALTEPLASE 2 MG IJ SOLR
2.0000 mg | Freq: Once | INTRAMUSCULAR | Status: DC | PRN
  Filled 2015-05-20: qty 2

## 2015-05-20 MED ORDER — HEPARIN SOD (PORK) LOCK FLUSH 100 UNIT/ML IV SOLN
500.0000 [IU] | Freq: Once | INTRAVENOUS | Status: AC
  Administered 2015-05-20: 500 [IU] via INTRAVENOUS
  Filled 2015-05-20: qty 5

## 2015-05-20 MED ORDER — SODIUM CHLORIDE 0.9 % IJ SOLN
10.0000 mL | INTRAMUSCULAR | Status: DC | PRN
Start: 2015-05-20 — End: 2015-05-20
  Administered 2015-05-20: 10 mL
  Filled 2015-05-20: qty 10

## 2015-05-20 MED ORDER — PROMETHAZINE HCL 25 MG PO TABS
ORAL_TABLET | ORAL | Status: AC
  Filled 2015-05-20: qty 1

## 2015-05-20 MED ORDER — ZOLEDRONIC ACID 4 MG/100ML IV SOLN
4.0000 mg | Freq: Once | INTRAVENOUS | Status: AC
  Administered 2015-05-20: 4 mg via INTRAVENOUS
  Filled 2015-05-20: qty 100

## 2015-05-20 MED ORDER — HYDROMORPHONE HCL 4 MG/ML IJ SOLN
2.0000 mg | Freq: Once | INTRAMUSCULAR | Status: AC
  Administered 2015-05-20: 2 mg via INTRAVENOUS

## 2015-05-20 MED ORDER — SODIUM CHLORIDE 0.9 % IV SOLN
Freq: Once | INTRAVENOUS | Status: AC
  Administered 2015-05-20: 12:00:00 via INTRAVENOUS

## 2015-05-20 NOTE — Patient Instructions (Signed)
Zoledronic Acid injection (Hypercalcemia, Oncology) What is this medicine? ZOLEDRONIC ACID (ZOE le dron ik AS id) lowers the amount of calcium loss from bone. It is used to treat too much calcium in your blood from cancer. It is also used to prevent complications of cancer that has spread to the bone. This medicine may be used for other purposes; ask your health care provider or pharmacist if you have questions. COMMON BRAND NAME(S): Zometa What should I tell my health care provider before I take this medicine? They need to know if you have any of these conditions: -aspirin-sensitive asthma -cancer, especially if you are receiving medicines used to treat cancer -dental disease or wear dentures -infection -kidney disease -receiving corticosteroids like dexamethasone or prednisone -an unusual or allergic reaction to zoledronic acid, other medicines, foods, dyes, or preservatives -pregnant or trying to get pregnant -breast-feeding How should I use this medicine? This medicine is for infusion into a vein. It is given by a health care professional in a hospital or clinic setting. Talk to your pediatrician regarding the use of this medicine in children. Special care may be needed. Overdosage: If you think you have taken too much of this medicine contact a poison control center or emergency room at once. NOTE: This medicine is only for you. Do not share this medicine with others. What if I miss a dose? It is important not to miss your dose. Call your doctor or health care professional if you are unable to keep an appointment. What may interact with this medicine? -certain antibiotics given by injection -NSAIDs, medicines for pain and inflammation, like ibuprofen or naproxen -some diuretics like bumetanide, furosemide -teriparatide -thalidomide This list may not describe all possible interactions. Give your health care provider a list of all the medicines, herbs, non-prescription drugs, or  dietary supplements you use. Also tell them if you smoke, drink alcohol, or use illegal drugs. Some items may interact with your medicine. What should I watch for while using this medicine? Visit your doctor or health care professional for regular checkups. It may be some time before you see the benefit from this medicine. Do not stop taking your medicine unless your doctor tells you to. Your doctor may order blood tests or other tests to see how you are doing. Women should inform their doctor if they wish to become pregnant or think they might be pregnant. There is a potential for serious side effects to an unborn child. Talk to your health care professional or pharmacist for more information. You should make sure that you get enough calcium and vitamin D while you are taking this medicine. Discuss the foods you eat and the vitamins you take with your health care professional. Some people who take this medicine have severe bone, joint, and/or muscle pain. This medicine may also increase your risk for jaw problems or a broken thigh bone. Tell your doctor right away if you have severe pain in your jaw, bones, joints, or muscles. Tell your doctor if you have any pain that does not go away or that gets worse. Tell your dentist and dental surgeon that you are taking this medicine. You should not have major dental surgery while on this medicine. See your dentist to have a dental exam and fix any dental problems before starting this medicine. Take good care of your teeth while on this medicine. Make sure you see your dentist for regular follow-up appointments. What side effects may I notice from receiving this medicine? Side effects that   you should report to your doctor or health care professional as soon as possible: -allergic reactions like skin rash, itching or hives, swelling of the face, lips, or tongue -anxiety, confusion, or depression -breathing problems -changes in vision -eye pain -feeling faint or  lightheaded, falls -jaw pain, especially after dental work -mouth sores -muscle cramps, stiffness, or weakness -trouble passing urine or change in the amount of urine Side effects that usually do not require medical attention (report to your doctor or health care professional if they continue or are bothersome): -bone, joint, or muscle pain -constipation -diarrhea -fever -hair loss -irritation at site where injected -loss of appetite -nausea, vomiting -stomach upset -trouble sleeping -trouble swallowing -weak or tired This list may not describe all possible side effects. Call your doctor for medical advice about side effects. You may report side effects to FDA at 1-800-FDA-1088. Where should I keep my medicine? This drug is given in a hospital or clinic and will not be stored at home. NOTE: This sheet is a summary. It may not cover all possible information. If you have questions about this medicine, talk to your doctor, pharmacist, or health care provider.  2015, Elsevier/Gold Standard. (2013-03-20 13:03:13) Dehydration, Adult Dehydration is when you lose more fluids from the body than you take in. Vital organs like the kidneys, brain, and heart cannot function without a proper amount of fluids and salt. Any loss of fluids from the body can cause dehydration.  CAUSES   Vomiting.  Diarrhea.  Excessive sweating.  Excessive urine output.  Fever. SYMPTOMS  Mild dehydration  Thirst.  Dry lips.  Slightly dry mouth. Moderate dehydration  Very dry mouth.  Sunken eyes.  Skin does not bounce back quickly when lightly pinched and released.  Dark urine and decreased urine production.  Decreased tear production.  Headache. Severe dehydration  Very dry mouth.  Extreme thirst.  Rapid, weak pulse (more than 100 beats per minute at rest).  Cold hands and feet.  Not able to sweat in spite of heat and temperature.  Rapid breathing.  Blue lips.  Confusion and  lethargy.  Difficulty being awakened.  Minimal urine production.  No tears. DIAGNOSIS  Your caregiver will diagnose dehydration based on your symptoms and your exam. Blood and urine tests will help confirm the diagnosis. The diagnostic evaluation should also identify the cause of dehydration. TREATMENT  Treatment of mild or moderate dehydration can often be done at home by increasing the amount of fluids that you drink. It is best to drink small amounts of fluid more often. Drinking too much at one time can make vomiting worse. Refer to the home care instructions below. Severe dehydration needs to be treated at the hospital where you will probably be given intravenous (IV) fluids that contain water and electrolytes. HOME CARE INSTRUCTIONS   Ask your caregiver about specific rehydration instructions.  Drink enough fluids to keep your urine clear or pale yellow.  Drink small amounts frequently if you have nausea and vomiting.  Eat as you normally do.  Avoid:  Foods or drinks high in sugar.  Carbonated drinks.  Juice.  Extremely hot or cold fluids.  Drinks with caffeine.  Fatty, greasy foods.  Alcohol.  Tobacco.  Overeating.  Gelatin desserts.  Wash your hands well to avoid spreading bacteria and viruses.  Only take over-the-counter or prescription medicines for pain, discomfort, or fever as directed by your caregiver.  Ask your caregiver if you should continue all prescribed and over-the-counter medicines.  Keep all   follow-up appointments with your caregiver. SEEK MEDICAL CARE IF:  You have abdominal pain and it increases or stays in one area (localizes).  You have a rash, stiff neck, or severe headache.  You are irritable, sleepy, or difficult to awaken.  You are weak, dizzy, or extremely thirsty. SEEK IMMEDIATE MEDICAL CARE IF:   You are unable to keep fluids down or you get worse despite treatment.  You have frequent episodes of vomiting or  diarrhea.  You have blood or green matter (bile) in your vomit.  You have blood in your stool or your stool looks black and tarry.  You have not urinated in 6 to 8 hours, or you have only urinated a small amount of very dark urine.  You have a fever.  You faint. MAKE SURE YOU:   Understand these instructions.  Will watch your condition.  Will get help right away if you are not doing well or get worse. Document Released: 10/09/2005 Document Revised: 01/01/2012 Document Reviewed: 05/29/2011 ExitCare Patient Information 2015 [employer]. This information is not intended to replace advice given to you by your health care provider. Make sure you discuss any questions you have with your health care provider.  

## 2015-05-20 NOTE — Progress Notes (Signed)
Hematology and Oncology Follow Up Visit  Bianca Kent [id number] Oct 25, 1965 49 y.o. 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  Bianca Kent is back for followup . She is not feeling well. She just doesn't have much of an appetite. Her tongue is still sore.  Her back has been bothering her. Her sacral region has really because a lot of pain. Her sister says that she's been in the bed for the past 4 or 5 days.  She is still wearing oxygen. She has a paralyzed right hemidiaphragm. Nobody still can understand why she has this paralyzed right hemidiaphragm.  She has had bad constipation. She does take narcotics because of all of her pain. She does have irritable bowel area and she may need the new medication-Movantik-for the constipation.  She has had no cough. I think she might still be smoking a little bit.  She's had no bleeding. She has had no fever. There's been no leg swelling.  Overall, her performance status is ECOG 2   Medications:  Current outpatient prescriptions:  .  albuterol (PROVENTIL HFA;VENTOLIN HFA) 108 (90 BASE) MCG/ACT inhaler, Inhale 2 puffs into the lungs every 4 (four) hours as needed for wheezing or shortness of breath., Disp: 2 Inhaler, Rfl: 6 .  ALPRAZolam (XANAX) 1 MG tablet, Take 1 tablet (1 mg total) by mouth at bedtime as needed for anxiety., Disp: 30 tablet, Rfl: 2 .  aspirin (ASPIRIN EC) 81 MG EC tablet, Take 81 mg by mouth daily. Swallow whole., Disp: , Rfl:  .  Bismuth Subsalicylate (PEPTO-BISMOL PO), Take 1 tablet by mouth as needed (upset stomach). , Disp: , Rfl:  .  calcium carbonate (TUMS - DOSED IN MG ELEMENTAL CALCIUM) 500 MG chewable tablet, Chew 2 tablets  by mouth 2 (two) times daily., Disp: , Rfl:  .  diphenhydrAMINE (SOMINEX) 25 MG tablet, Take 100 mg by mouth at bedtime as needed for sleep. Pt states she is taking up to 200 mg Benadryl at night and still not sleeping, Disp: , Rfl:  .  docusate sodium (COLACE) 100 MG capsule, Take 1 capsule (100 mg total) by mouth 2 (two) times daily as needed for mild constipation or moderate constipation., Disp: 10 capsule, Rfl: 0 .  dronabinol (MARINOL) 10 MG capsule, Take 1 capsule (10 mg total) by mouth 3 (three) times daily before meals., Disp: 90 capsule, Rfl: 0 .  ergocalciferol (VITAMIN D2) 50000 UNITS capsule, Take 1 capsule (50,000 Units total) by mouth once a week., Disp: 4 capsule, Rfl: 6 .  fentaNYL (DURAGESIC - DOSED MCG/HR) 50 MCG/HR, Place 1 patch (50 mcg total) onto the skin every 3 (three) days., Disp: 10 patch, Rfl: 0 .  HYDROmorphone (DILAUDID) 4 MG tablet, Take 1 tablet (4 mg total) by mouth every 4 (four) hours as needed for severe pain., Disp: 90 tablet, Rfl: 0 .  ibuprofen (ADVIL,MOTRIN) 600 MG tablet, Take 1 tablet (600 mg total) by mouth every 8 (eight) hours as needed for moderate pain., Disp: 30 tablet, Rfl: 0 .  Lactulose SOLN, Take 2 TBLSP every 6 hours until bowel movement, Disp: 1000 mL, Rfl: 4 .  lidocaine (LIDODERM) 5 %, Place 1 patch onto  the skin daily. Remove & Discard patch within 12 hours or as directed by MD, Disp: 20 patch, Rfl: 0 .  lidocaine (XYLOCAINE) 2 % solution, Use as directed 20 mLs in the mouth or throat as needed for mouth pain., Disp: 200 mL, Rfl: 2 .  loratadine-pseudoephedrine (CLARITIN-D 24-HOUR) 10-240 MG per 24 hr tablet, Take 1 tablet by mouth daily., Disp: , Rfl:  .  methylphenidate (RITALIN) 10 MG tablet, Take 1 tablet (10 mg total) by mouth 2 (two) times daily., Disp: 60 tablet, Rfl: 0 .  metoCLOPramide (REGLAN) 10 MG tablet, Take 1 tablet (10 mg total) by mouth every 6 (six) hours as needed for nausea., Disp: 120 tablet, Rfl: 5 .  Multiple Vitamin  (MULTIVITAMIN) tablet, Take 1 tablet by mouth daily., Disp: , Rfl:  .  NONFORMULARY OR COMPOUNDED ITEM, Take 5 mLs by mouth as needed (irritation). DUKES MOUTHWASH  SWISH AND SWALLOW, Disp: , Rfl:  .  OLANZapine (ZYPREXA) 5 MG tablet, Take 1 tablet (5 mg total) by mouth at bedtime., Disp: 30 tablet, Rfl: 3 .  ondansetron (ZOFRAN) 8 MG tablet, TAKE 1 TABLET BY MOUTH EVERY 8 HOURS AS NEEDED FOR NAUSEA OR VOMITING, Disp: 20 tablet, Rfl: 2 .  pantoprazole (PROTONIX) 40 MG tablet, Take 1 tablet (40 mg total) by mouth daily., Disp: 30 tablet, Rfl: 6 .  pilocarpine (SALAGEN) 5 MG tablet, Take 1 tablet (5 mg total) by mouth 2 (two) times daily., Disp: 60 tablet, Rfl: 3 .  prochlorperazine (COMPAZINE) 10 MG tablet, Take 1 tablet (10 mg total) by mouth every 6 (six) hours as needed for nausea or vomiting., Disp: 60 tablet, Rfl: 2 .  promethazine (PHENERGAN) 25 MG suppository, Place 1 suppository (25 mg total) rectally every 6 (six) hours as needed for nausea or vomiting., Disp: 30 each, Rfl: 3 .  pyridoxine (B-6) 250 MG tablet, Take 1 tablet (250 mg total) by mouth daily., Disp: 90 tablet, Rfl: 4 .  traMADol (ULTRAM) 50 MG tablet, TAKE 1 TO 2 TABLETS BY MOUTH EVERY 6 HOURS AS NEEDED FOR PAIN., Disp: , Rfl: 0 .  traZODone (DESYREL) 100 MG tablet, TAKE 1 TO 2 TABLETS BY MOUTH AS NEEDED FOR SLEEP, Disp: 30 tablet, Rfl: 2 No current facility-administered medications for this visit.  Facility-Administered Medications Ordered in Other Visits:  .  alteplase (CATHFLO ACTIVASE) injection 2 mg, 2 mg, Intracatheter, Once PRN, [name], MD .  heparin lock flush 100 unit/mL, 250 Units, Intracatheter, Once PRN, [name], MD .  sodium chloride 0.9 % injection 10 mL, 10 mL, Intracatheter, PRN, [name], MD .  sodium chloride 0.9 % injection 3 mL, 3 mL, Intravenous, Once PRN, [name], MD .  Zoledronic Acid (ZOMETA) 4 mg IVPB, 4 mg, Intravenous, Once, [name], MD, Last Rate: 133.3 mL/hr at  05/20/15 1152, 4 mg at 05/20/15 1152  Allergies:  Allergies  Allergen Reactions  . Iohexol      Code: RASH, Desc: VERY STRONG FAMILY HX OF ANGIOEDEMA WHEN RECEIVING IV CONTRAST; PT HAS BEEN PREMEDICATED FOR OTHER CONTRASTED STUDIES(IN CATH. LAB)  KR, Onset Date: [id number]   . Prednisone Itching    Capillary beds bust  . Tetanus Toxoids Other (See Comments)    Ran a high fever for 48 hours  . Theophyllines Hives    Mental changes  . Versed [Midazolam] Other (See Comments)    Pt becomes violent    Past Medical History, Surgical history, Social history, and Family History  were reviewed and updated.  Review of Systems: As above  Physical Exam:  height is 5\' 7"  (1.702 m) and weight is 152 lb (68.947 kg). Her temperature is 98 F (36.7 C). Her blood pressure is 104/70 and her pulse is 77. Her respiration is 16.   Well-developed and well-nourished white female. Head and exam shows no ocular or oral lesions. She's able to move both eyes well. She has good extraocular muscle . There is no erythema or exudate from the left eye. There might be some slight fullness in the right axilla by do not detect any obvious adenopathy. I cannot detect any obvious right supraclavicular lymph nodes. She has improved range of motion of the right shoulder. Left supraclavicular region is okay. No masses are noted. Lungs are clear. Cardiac exam regular rate and rhythm with no murmurs, rubs or bruits.. Abdomen is soft. She has good bowel sounds. There is no palpable liver or spleen tip. Back exam shows decreased tenderness over the lumbar and thoracic spine to palpation. Extremities shows no clubbing, cyanosis or edema. She has 4/5 strength in her legs. Skin exam shows improvement in the radiation dermatitis of the right breast and axilla. There is no open wound. There is some hyper pigmentation. Neurological exam is nonfocal.    Lab Results  Component Value Date   WBC 4.2 05/20/2015   HGB 13.4 05/20/2015   HCT  39.9 05/20/2015   MCV 85 05/20/2015   PLT 100* 05/20/2015     Chemistry      Component Value Date/Time   NA 131 05/20/2015 1104   NA 138 04/30/2015 1019   NA 138 03/19/2015 0312   K 3.5 05/20/2015 1104   K 3.5 04/30/2015 1019   K 3.8 03/19/2015 0312   CL 101 05/20/2015 1104   CL 101 03/19/2015 0312   CO2 28 05/20/2015 1104   CO2 29 04/30/2015 1019   CO2 29 03/19/2015 0312   BUN 11 05/20/2015 1104   BUN 9.7 04/30/2015 1019   BUN 7 03/19/2015 0312   CREATININE 0.7 05/20/2015 1104   CREATININE 0.8 04/30/2015 1019   CREATININE 0.75 03/19/2015 0312      Component Value Date/Time   CALCIUM 9.4 05/20/2015 1104   CALCIUM 9.7 04/30/2015 1019   CALCIUM 8.7* 03/19/2015 0312   ALKPHOS 176* 05/20/2015 1104   ALKPHOS 257* 04/30/2015 1019   ALKPHOS 238* 03/06/2015 0425   AST 47* 05/20/2015 1104   AST 57* 04/30/2015 1019   AST 41 03/06/2015 0425   ALT 33 05/20/2015 1104   ALT 41 04/30/2015 1019   ALT 38 03/06/2015 0425   BILITOT 0.70 05/20/2015 1104   BILITOT 0.60 04/30/2015 1019   BILITOT 0.4 03/06/2015 0425         Impression and Plan: Ms. Rubey is 49 year old white female with triple positive metastatic breast cancer.  I just worry about her having progressive disease. Again, the last PET scan may show something with the back but everything else looked pretty stable.  As far as treatment for her, I want to hold off on treatment today. I really think that a break in treatment would help her out.  I think that would probably having to look at actual chemotherapy. She has been on hormonal therapy and has done pretty well.  I will have to see what we might be over to utilize for treatment. I might want to go with gemcitabine. She does not want to lose her hair.  We will plan to  get her back in another 3-4 weeks.  She wants to see her sister in [place] I think the end of August. Hopefully, we can hold off on any therapy until after she gets back from her  vacation.   [name], MD 7/28/201612:19 PM

## 2015-05-20 NOTE — Addendum Note (Signed)
Addended by: Burney Gauze R on: 05/20/2015 01:06 PM   Modules accepted: Orders

## 2015-05-20 NOTE — Addendum Note (Signed)
Addended by: Burney Gauze R on: 05/20/2015 12:59 PM   Modules accepted: Orders

## 2015-05-27 ENCOUNTER — Ambulatory Visit (HOSPITAL_COMMUNITY)
Admission: RE | Admit: 2015-05-27 | Discharge: 2015-05-27 | Disposition: A | Discharge status: Home / Self Care | Payer: 59 | Source: Ambulatory Visit | Attending: Hematology & Oncology | Admitting: Hematology & Oncology

## 2015-05-27 ENCOUNTER — Other Ambulatory Visit: Payer: Self-pay | Admitting: Hematology & Oncology

## 2015-05-27 DIAGNOSIS — C50912 Malignant neoplasm of unspecified site of left female breast: Secondary | ICD-10-CM | POA: Diagnosis not present

## 2015-05-27 DIAGNOSIS — D500 Iron deficiency anemia secondary to blood loss (chronic): Secondary | ICD-10-CM | POA: Insufficient documentation

## 2015-05-27 DIAGNOSIS — D510 Vitamin B12 deficiency anemia due to intrinsic factor deficiency: Secondary | ICD-10-CM | POA: Diagnosis not present

## 2015-05-27 DIAGNOSIS — M2548 Effusion, other site: Secondary | ICD-10-CM | POA: Insufficient documentation

## 2015-05-27 DIAGNOSIS — M545 Low back pain: Secondary | ICD-10-CM | POA: Diagnosis present

## 2015-05-27 DIAGNOSIS — R1110 Vomiting, unspecified: Secondary | ICD-10-CM | POA: Insufficient documentation

## 2015-05-27 DIAGNOSIS — M419 Scoliosis, unspecified: Secondary | ICD-10-CM | POA: Insufficient documentation

## 2015-05-27 DIAGNOSIS — C799 Secondary malignant neoplasm of unspecified site: Secondary | ICD-10-CM | POA: Insufficient documentation

## 2015-05-27 DIAGNOSIS — M4806 Spinal stenosis, lumbar region: Secondary | ICD-10-CM | POA: Diagnosis not present

## 2015-05-27 DIAGNOSIS — M4606 Spinal enthesopathy, lumbar region: Secondary | ICD-10-CM | POA: Diagnosis not present

## 2015-05-27 MED ORDER — GADOBENATE DIMEGLUMINE 529 MG/ML IV SOLN
15.0000 mL | Freq: Once | INTRAVENOUS | Status: AC | PRN
  Administered 2015-05-27: 14 mL via INTRAVENOUS

## 2015-05-28 ENCOUNTER — Other Ambulatory Visit: Payer: Self-pay | Admitting: Radiation Therapy

## 2015-05-28 DIAGNOSIS — C7931 Secondary malignant neoplasm of brain: Secondary | ICD-10-CM

## 2015-06-02 ENCOUNTER — Telehealth: Payer: Self-pay | Admitting: *Deleted

## 2015-06-02 ENCOUNTER — Other Ambulatory Visit: Payer: Self-pay | Admitting: *Deleted

## 2015-06-02 DIAGNOSIS — C50912 Malignant neoplasm of unspecified site of left female breast: Secondary | ICD-10-CM

## 2015-06-02 NOTE — Telephone Encounter (Signed)
Patient's sister called stating that [place] needs a specific oxygen order to obtain an oxygen concentrator for their trip in the near future. Order obtained from Dr [name] and faxed to [facility].

## 2015-06-08 ENCOUNTER — Ambulatory Visit: Payer: 59 | Admitting: Hematology & Oncology

## 2015-06-08 ENCOUNTER — Other Ambulatory Visit: Payer: 59

## 2015-06-08 ENCOUNTER — Ambulatory Visit: Payer: 59

## 2015-06-21 ENCOUNTER — Encounter: Payer: Self-pay | Admitting: *Deleted

## 2015-06-24 ENCOUNTER — Telehealth: Payer: Self-pay | Admitting: Pharmacist

## 2015-06-24 ENCOUNTER — Ambulatory Visit (HOSPITAL_BASED_OUTPATIENT_CLINIC_OR_DEPARTMENT_OTHER): Payer: 59 | Admitting: Hematology & Oncology

## 2015-06-24 ENCOUNTER — Encounter: Payer: Self-pay | Admitting: Hematology & Oncology

## 2015-06-24 ENCOUNTER — Other Ambulatory Visit (HOSPITAL_BASED_OUTPATIENT_CLINIC_OR_DEPARTMENT_OTHER): Payer: 59

## 2015-06-24 ENCOUNTER — Ambulatory Visit (HOSPITAL_BASED_OUTPATIENT_CLINIC_OR_DEPARTMENT_OTHER): Payer: 59

## 2015-06-24 VITALS — BP 118/76 | HR 79 | Temp 97.9°F | Resp 16 | Ht 67.0 in | Wt 158.0 lb

## 2015-06-24 DIAGNOSIS — R5383 Other fatigue: Secondary | ICD-10-CM

## 2015-06-24 DIAGNOSIS — D510 Vitamin B12 deficiency anemia due to intrinsic factor deficiency: Secondary | ICD-10-CM

## 2015-06-24 DIAGNOSIS — R112 Nausea with vomiting, unspecified: Secondary | ICD-10-CM

## 2015-06-24 DIAGNOSIS — G4701 Insomnia due to medical condition: Secondary | ICD-10-CM

## 2015-06-24 DIAGNOSIS — C50912 Malignant neoplasm of unspecified site of left female breast: Secondary | ICD-10-CM | POA: Diagnosis not present

## 2015-06-24 DIAGNOSIS — C50911 Malignant neoplasm of unspecified site of right female breast: Secondary | ICD-10-CM

## 2015-06-24 DIAGNOSIS — D500 Iron deficiency anemia secondary to blood loss (chronic): Secondary | ICD-10-CM

## 2015-06-24 DIAGNOSIS — C7989 Secondary malignant neoplasm of other specified sites: Secondary | ICD-10-CM | POA: Diagnosis not present

## 2015-06-24 DIAGNOSIS — Z5112 Encounter for antineoplastic immunotherapy: Secondary | ICD-10-CM | POA: Diagnosis not present

## 2015-06-24 DIAGNOSIS — C50811 Malignant neoplasm of overlapping sites of right female breast: Secondary | ICD-10-CM

## 2015-06-24 DIAGNOSIS — R64 Cachexia: Secondary | ICD-10-CM

## 2015-06-24 DIAGNOSIS — C7951 Secondary malignant neoplasm of bone: Secondary | ICD-10-CM | POA: Diagnosis not present

## 2015-06-24 DIAGNOSIS — R1110 Vomiting, unspecified: Secondary | ICD-10-CM

## 2015-06-24 LAB — CBC WITH DIFFERENTIAL (CANCER CENTER ONLY)
BASO#: 0 10*3/uL (ref 0.0–0.2)
BASO%: 0.5 % (ref 0.0–2.0)
EOS ABS: 0.1 10*3/uL (ref 0.0–0.5)
EOS%: 3 % (ref 0.0–7.0)
HCT: 40 % (ref 34.8–46.6)
HGB: 13.1 g/dL (ref 11.6–15.9)
LYMPH#: 1.1 10*3/uL (ref 0.9–3.3)
LYMPH%: 25.5 % (ref 14.0–48.0)
MCH: 28.2 pg (ref 26.0–34.0)
MCHC: 32.8 g/dL (ref 32.0–36.0)
MCV: 86 fL (ref 81–101)
MONO#: 0.5 10*3/uL (ref 0.1–0.9)
MONO%: 10.9 % (ref 0.0–13.0)
NEUT#: 2.7 10*3/uL (ref 1.5–6.5)
NEUT%: 60.1 % (ref 39.6–80.0)
PLATELETS: 101 10*3/uL — AB (ref 145–400)
RBC: 4.64 10*6/uL (ref 3.70–5.32)
RDW: 16.7 % — ABNORMAL HIGH (ref 11.1–15.7)
WBC: 4.4 10*3/uL (ref 3.9–10.0)

## 2015-06-24 LAB — IRON AND TIBC CHCC
%SAT: 35 % (ref 21–57)
Iron: 94 ug/dL (ref 41–142)
TIBC: 270 ug/dL (ref 236–444)
UIBC: 176 ug/dL (ref 120–384)

## 2015-06-24 LAB — CMP (CANCER CENTER ONLY)
ALK PHOS: 209 U/L — AB (ref 26–84)
ALT(SGPT): 47 U/L (ref 10–47)
AST: 43 U/L — AB (ref 11–38)
Albumin: 3.5 g/dL (ref 3.3–5.5)
BILIRUBIN TOTAL: 0.6 mg/dL (ref 0.20–1.60)
BUN, Bld: 13 mg/dL (ref 7–22)
CO2: 26 meq/L (ref 18–33)
CREATININE: 0.8 mg/dL (ref 0.6–1.2)
Calcium: 9.1 mg/dL (ref 8.0–10.3)
Chloride: 106 mEq/L (ref 98–108)
GLUCOSE: 90 mg/dL (ref 73–118)
Potassium: 3.9 mEq/L (ref 3.3–4.7)
SODIUM: 137 meq/L (ref 128–145)
Total Protein: 7.2 g/dL (ref 6.4–8.1)

## 2015-06-24 LAB — FERRITIN CHCC: FERRITIN: 131 ng/mL (ref 9–269)

## 2015-06-24 MED ORDER — ACETAMINOPHEN 325 MG PO TABS
ORAL_TABLET | ORAL | Status: AC
  Filled 2015-06-24: qty 2

## 2015-06-24 MED ORDER — FENTANYL 50 MCG/HR TD PT72: 50.0000 ug | MEDICATED_PATCH | TRANSDERMAL | Status: DC

## 2015-06-24 MED ORDER — SODIUM CHLORIDE 0.9 % IV SOLN
330.0000 mg | Freq: Once | INTRAVENOUS | Status: AC
  Administered 2015-06-24: 330 mg via INTRAVENOUS
  Filled 2015-06-24: qty 11

## 2015-06-24 MED ORDER — ZOLEDRONIC ACID 4 MG/100ML IV SOLN
4.0000 mg | Freq: Once | INTRAVENOUS | Status: AC
  Administered 2015-06-24: 4 mg via INTRAVENOUS
  Filled 2015-06-24: qty 100

## 2015-06-24 MED ORDER — HYDROMORPHONE HCL 4 MG PO TABS: 4.0000 mg | ORAL_TABLET | ORAL | Status: DC | PRN

## 2015-06-24 MED ORDER — HEPARIN SOD (PORK) LOCK FLUSH 100 UNIT/ML IV SOLN
500.0000 [IU] | Freq: Once | INTRAVENOUS | Status: AC | PRN
  Administered 2015-06-24: 500 [IU]
  Filled 2015-06-24: qty 5

## 2015-06-24 MED ORDER — ACETAMINOPHEN 325 MG PO TABS
650.0000 mg | ORAL_TABLET | Freq: Once | ORAL | Status: AC
  Administered 2015-06-24: 10:00:00 via ORAL

## 2015-06-24 MED ORDER — CAPECITABINE 500 MG PO TABS: 1000.0000 mg/m2 | ORAL_TABLET | Freq: Two times a day (BID) | ORAL | Status: DC

## 2015-06-24 MED ORDER — DRONABINOL 10 MG PO CAPS: 10.0000 mg | ORAL_CAPSULE | Freq: Three times a day (TID) | ORAL | Status: DC

## 2015-06-24 MED ORDER — SODIUM CHLORIDE 0.9 % IV SOLN
Freq: Once | INTRAVENOUS | Status: AC
  Administered 2015-06-24: 10:00:00 via INTRAVENOUS

## 2015-06-24 MED ORDER — DIPHENHYDRAMINE HCL 25 MG PO CAPS
ORAL_CAPSULE | ORAL | Status: AC
  Filled 2015-06-24: qty 2

## 2015-06-24 MED ORDER — CAPECITABINE 500 MG PO TABS: ORAL_TABLET | ORAL | Status: DC

## 2015-06-24 MED ORDER — DIPHENHYDRAMINE HCL 25 MG PO CAPS
50.0000 mg | ORAL_CAPSULE | Freq: Once | ORAL | Status: AC
  Administered 2015-06-24: 50 mg via ORAL

## 2015-06-24 MED ORDER — SODIUM CHLORIDE 0.9 % IJ SOLN
10.0000 mL | INTRAMUSCULAR | Status: DC | PRN
  Administered 2015-06-24: 10 mL
  Filled 2015-06-24: qty 10

## 2015-06-24 MED ORDER — TRASTUZUMAB CHEMO INJECTION 440 MG
6.0000 mg/kg | Freq: Once | INTRAVENOUS | Status: AC
  Administered 2015-06-24: 420 mg via INTRAVENOUS
  Filled 2015-06-24: qty 20

## 2015-06-24 NOTE — Patient Instructions (Signed)
[facility] Discharge Instructions for Patients Receiving Chemotherapy  Today you received the following chemotherapy agents Herceptin, Perjeta,   To help prevent nausea and vomiting after your treatment, we encourage you to take your nausea medication    If you develop nausea and vomiting that is not controlled by your nausea medication, call the clinic.   BELOW ARE SYMPTOMS THAT SHOULD BE REPORTED IMMEDIATELY:  *FEVER GREATER THAN 100.5 F  *CHILLS WITH OR WITHOUT FEVER  NAUSEA AND VOMITING THAT IS NOT CONTROLLED WITH YOUR NAUSEA MEDICATION  *UNUSUAL SHORTNESS OF BREATH  *UNUSUAL BRUISING OR BLEEDING  TENDERNESS IN MOUTH AND THROAT WITH OR WITHOUT PRESENCE OF ULCERS  *URINARY PROBLEMS  *BOWEL PROBLEMS  UNUSUAL RASH Items with * indicate a potential emergency and should be followed up as soon as possible.  Feel free to call the clinic you have any questions or concerns. The clinic phone number is [phone].  Please show the [facility] at check-in to the Emergency Department and triage nurse.

## 2015-06-24 NOTE — Telephone Encounter (Signed)
Samples given:  Sancuso 3.1 mg/24hr patch   #2                            Lot:  [id number]        Exp:  07/23/2015

## 2015-06-24 NOTE — Progress Notes (Signed)
Hematology and Oncology Follow Up Visit  Bianca Kent [id number] Jul 16, 1966 49 y.o. 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 is back for followup . She looks a whole lot better. She is still wearing some oxygen. Hopefully,  the amount of oxygen that she needs will keep diminishing.  She really had a good time on vacation. She was in [place]. She really enjoyed herself.  I think that we are going to have to make a change with her program. I really think that the mucositis that she had with the [place] was causing a lot of problems for her. Even though things seem to be working, she really suffer from side effects.  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 A Mosher 1 her last chest x-ray was. I don't know if she is still followed by pulmonary.  She does not have any ocular issues. The entrapment of her ocular muscles resolved with surgery but she had an incredibly hard time with recovery and developed sepsis and really had problems. She is in the hospital for I think over 2 weeks.  I think that we've I had to try her on actual chemotherapy. I think Xeloda would not be a bad idea for her. She wants to keep her hair. I think Xeloda we can do this. I think that she will respond.  She still has triple-positive disease so I will continue her on the Herceptin along with Perjeta.  She is having some discomfort with the right shoulder. She sees the orthopedist for this.  She is going to the bathroom okay. She's not having any problems of constipation or diarrhea.  She's not had any leg swelling. There has been no rashes.  Overall, her performance status is  ECOG 1   Medications:  Current outpatient prescriptions:  .  albuterol (PROVENTIL HFA;VENTOLIN HFA) 108 (90 BASE) MCG/ACT inhaler, Inhale 2 puffs into the lungs every 4 (four) hours as needed for wheezing or shortness of breath., Disp: 2 Inhaler, Rfl: 6 .  ALPRAZolam (XANAX) 1 MG tablet, Take 1 tablet (1 mg total) by mouth at bedtime as needed for anxiety., Disp: 30 tablet, Rfl: 2 .  aspirin (ASPIRIN EC) 81 MG EC tablet, Take 81 mg by mouth daily. Swallow whole., Disp: , Rfl:  .  Bismuth Subsalicylate (PEPTO-BISMOL PO), Take 1 tablet by mouth as needed (upset stomach). , Disp: , Rfl:  .  calcium carbonate (TUMS - DOSED IN MG ELEMENTAL CALCIUM) 500 MG chewable tablet, Chew 2 tablets by mouth 2 (two) times daily., Disp: , Rfl:  .  capecitabine (XELODA) 500 MG tablet, Take 4 tablets with food twice a day for 14 days, Disp: 112 tablet, Rfl: 3 .  diphenhydrAMINE (SOMINEX) 25 MG tablet, Take 100 mg by mouth at bedtime as needed for sleep. Pt states she is taking up to 200 mg Benadryl at night and still not sleeping, Disp: , Rfl:  .  docusate sodium (COLACE) 100 MG capsule, Take 1 capsule (100 mg total) by mouth 2 (two) times daily as needed for mild constipation or moderate constipation., Disp: 10 capsule, Rfl: 0 .  dronabinol (MARINOL) 10 MG  capsule, Take 1 capsule (10 mg total) by mouth 3 (three) times daily before meals., Disp: 90 capsule, Rfl: 0 .  ergocalciferol (VITAMIN D2) 50000 UNITS capsule, Take 1 capsule (50,000 Units total) by mouth once a week., Disp: 4 capsule, Rfl: 6 .  fentaNYL (DURAGESIC - DOSED MCG/HR) 50 MCG/HR, Place 1 patch (50 mcg total) onto the skin every 3 (three) days., Disp: 10 patch, Rfl: 0 .  HYDROmorphone (DILAUDID) 4 MG tablet, Take 1 tablet (4 mg total) by mouth every 4 (four) hours as needed for severe pain., Disp: 90 tablet, Rfl: 0 .  ibuprofen (ADVIL,MOTRIN) 600 MG tablet, Take 1 tablet (600 mg total) by mouth every 8 (eight) hours as needed for moderate pain., Disp: 30  tablet, Rfl: 0 .  Lactulose SOLN, Take 2 TBLSP every 6 hours until bowel movement, Disp: 1000 mL, Rfl: 4 .  lidocaine (LIDODERM) 5 %, Place 1 patch onto the skin daily. Remove & Discard patch within 12 hours or as directed by MD, Disp: 20 patch, Rfl: 0 .  lidocaine (XYLOCAINE) 2 % solution, Use as directed 20 mLs in the mouth or throat as needed for mouth pain., Disp: 200 mL, Rfl: 2 .  loratadine-pseudoephedrine (CLARITIN-D 24-HOUR) 10-240 MG per 24 hr tablet, Take 1 tablet by mouth daily., Disp: , Rfl:  .  methylphenidate (RITALIN) 10 MG tablet, Take 1 tablet (10 mg total) by mouth 2 (two) times daily., Disp: 60 tablet, Rfl: 0 .  metoCLOPramide (REGLAN) 10 MG tablet, Take 1 tablet (10 mg total) by mouth every 6 (six) hours as needed for nausea., Disp: 120 tablet, Rfl: 5 .  Multiple Vitamin (MULTIVITAMIN) tablet, Take 1 tablet by mouth daily., Disp: , Rfl:  .  naloxegol oxalate (MOVANTIK) 25 MG TABS tablet, Take 1 tablet (25 mg total) by mouth daily., Disp: 30 tablet, Rfl: 6 .  NONFORMULARY OR COMPOUNDED ITEM, Take 5 mLs by mouth as needed (irritation). DUKES MOUTHWASH  SWISH AND SWALLOW, Disp: , Rfl:  .  OLANZapine (ZYPREXA) 5 MG tablet, Take 1 tablet (5 mg total) by mouth at bedtime., Disp: 30 tablet, Rfl: 3 .  ondansetron (ZOFRAN) 8 MG tablet, TAKE 1 TABLET BY MOUTH EVERY 8 HOURS AS NEEDED FOR NAUSEA OR VOMITING, Disp: 20 tablet, Rfl: 2 .  pantoprazole (PROTONIX) 40 MG tablet, Take 1 tablet (40 mg total) by mouth daily., Disp: 30 tablet, Rfl: 6 .  pilocarpine (SALAGEN) 5 MG tablet, Take 1 tablet (5 mg total) by mouth 2 (two) times daily., Disp: 60 tablet, Rfl: 3 .  prochlorperazine (COMPAZINE) 10 MG tablet, Take 1 tablet (10 mg total) by mouth every 6 (six) hours as needed for nausea or vomiting., Disp: 60 tablet, Rfl: 2 .  promethazine (PHENERGAN) 25 MG suppository, Place 1 suppository (25 mg total) rectally every 6 (six) hours as needed for nausea or vomiting., Disp: 30 each, Rfl: 3 .   pyridoxine (B-6) 250 MG tablet, Take 1 tablet (250 mg total) by mouth daily., Disp: 90 tablet, Rfl: 4 .  traMADol (ULTRAM) 50 MG tablet, TAKE 1 TO 2 TABLETS BY MOUTH EVERY 6 HOURS AS NEEDED FOR PAIN., Disp: , Rfl: 0 .  traZODone (DESYREL) 100 MG tablet, TAKE 1 TO 2 TABLETS BY MOUTH AS NEEDED FOR SLEEP, Disp: 30 tablet, Rfl: 2  Allergies:  Allergies  Allergen Reactions  . Iohexol      Code: RASH, Desc: VERY STRONG FAMILY HX OF ANGIOEDEMA WHEN RECEIVING IV CONTRAST; PT HAS BEEN PREMEDICATED FOR OTHER CONTRASTED STUDIES(IN CATH.  LAB)  KR, Onset Date: [id number]   . Prednisone Itching    Capillary beds bust  . Tetanus Toxoids Other (See Comments)    Ran a high fever for 48 hours  . Theophyllines Hives    Mental changes  . Versed [Midazolam] Other (See Comments)    Pt becomes violent    Past Medical History, Surgical history, Social history, and Family History were reviewed and updated.  Review of Systems: As above  Physical Exam:  height is 5\' 7"  (1.702 m) and weight is 158 lb (71.668 kg). Her oral temperature is 97.9 F (36.6 C). Her blood pressure is 118/76 and her pulse is 79. Her respiration is 16.   Well-developed and well-nourished white female. Head and exam shows no ocular or oral lesions. She's able to move both eyes well. She has good extraocular muscle . There is no erythema or exudate from the left eye. There might be some slight fullness in the right axilla by do not detect any obvious adenopathy. I cannot detect any obvious right supraclavicular lymph nodes. She has improved range of motion of the right shoulder. Left supraclavicular region is okay. No masses are noted. Lungs are clear. Cardiac exam regular rate and rhythm with no murmurs, rubs or bruits.. Abdomen is soft. She has good bowel sounds. There is no palpable liver or spleen tip. Back exam shows decreased tenderness over the lumbar and thoracic spine to palpation. Extremities shows no clubbing, cyanosis or edema. She  has 4/5 strength in her legs. Skin exam shows improvement in the radiation dermatitis of the right breast and axilla. There is no open wound. There is some hyper pigmentation. Neurological exam is nonfocal.    Lab Results  Component Value Date   WBC 4.4 06/24/2015   HGB 13.1 06/24/2015   HCT 40.0 06/24/2015   MCV 86 06/24/2015   PLT 101* 06/24/2015     Chemistry      Component Value Date/Time   NA 137 06/24/2015 0836   NA 138 04/30/2015 1019   NA 138 03/19/2015 0312   K 3.9 06/24/2015 0836   K 3.5 04/30/2015 1019   K 3.8 03/19/2015 0312   CL 106 06/24/2015 0836   CL 101 03/19/2015 0312   CO2 26 06/24/2015 0836   CO2 29 04/30/2015 1019   CO2 29 03/19/2015 0312   BUN 13 06/24/2015 0836   BUN 9.7 04/30/2015 1019   BUN 7 03/19/2015 0312   CREATININE 0.8 06/24/2015 0836   CREATININE 0.8 04/30/2015 1019   CREATININE 0.75 03/19/2015 0312      Component Value Date/Time   CALCIUM 9.1 06/24/2015 0836   CALCIUM 9.7 04/30/2015 1019   CALCIUM 8.7* 03/19/2015 0312   ALKPHOS 209* 06/24/2015 0836   ALKPHOS 257* 04/30/2015 1019   ALKPHOS 238* 03/06/2015 0425   AST 43* 06/24/2015 0836   AST 57* 04/30/2015 1019   AST 41 03/06/2015 0425   ALT 47 06/24/2015 0836   ALT 41 04/30/2015 1019   ALT 38 03/06/2015 0425   BILITOT 0.60 06/24/2015 0836   BILITOT 0.60 04/30/2015 1019   BILITOT 0.4 03/06/2015 0425         Impression and Plan: Bianca Kent is 49 year old white female with triple positive metastatic breast cancer.  I we'll see how she does with Xeloda. I spent over 30 minutes talking with she and her sister.  We will hopefully find that she will respond.  We really cannot use the CA 27.29.  I will have  to make sure we check another echocardiogram on her.  We will plan to get her back in one month. In 3 weeks, her sister, who comes with her, will have a important meeting that she has to go to. I'll see a problem with moving her back when actually weak.   [name],  MD 9/1/20165:32 PM

## 2015-06-24 NOTE — Progress Notes (Signed)
Patient O2 level monitored resting in recliner with 2L/ at 98%. O2 removed and rechecked within 30 min. RA sats 93% resting. Ambulated 287ft on department on RA and O2 levels decreased to 88%. Pt was slightly dyspneic. Sat in recliner and O2 sats increased to 90%. Oxygen reapplied and sats increased to 94%.

## 2015-06-25 ENCOUNTER — Telehealth: Payer: Self-pay | Admitting: Hematology & Oncology

## 2015-06-25 NOTE — Telephone Encounter (Signed)
Lt mess regarding echo appt at [facility] on 9/16 at 8:45am

## 2015-06-25 NOTE — Telephone Encounter (Signed)
Faxed last medical records update for case mgmt review:  To: Milana Kidney, RN CM Fx: [phone] Ph: [phone] ext [phone]    Case Id: [id number]     COPY SCANNED

## 2015-07-01 ENCOUNTER — Other Ambulatory Visit: Payer: Self-pay | Admitting: *Deleted

## 2015-07-01 DIAGNOSIS — G4701 Insomnia due to medical condition: Secondary | ICD-10-CM

## 2015-07-01 DIAGNOSIS — C50912 Malignant neoplasm of unspecified site of left female breast: Secondary | ICD-10-CM

## 2015-07-01 DIAGNOSIS — C50911 Malignant neoplasm of unspecified site of right female breast: Secondary | ICD-10-CM

## 2015-07-01 DIAGNOSIS — R5383 Other fatigue: Secondary | ICD-10-CM

## 2015-07-01 DIAGNOSIS — C7931 Secondary malignant neoplasm of brain: Secondary | ICD-10-CM

## 2015-07-01 DIAGNOSIS — R64 Cachexia: Secondary | ICD-10-CM

## 2015-07-01 DIAGNOSIS — R112 Nausea with vomiting, unspecified: Secondary | ICD-10-CM

## 2015-07-01 MED ORDER — OLANZAPINE 5 MG PO TABS: 5.0000 mg | ORAL_TABLET | Freq: Every day | ORAL | Status: DC

## 2015-07-01 MED ORDER — TRAZODONE HCL 100 MG PO TABS: ORAL_TABLET | ORAL | Status: DC

## 2015-07-01 MED ORDER — ALPRAZOLAM 1 MG PO TABS: 1.0000 mg | ORAL_TABLET | Freq: Every evening | ORAL | Status: DC | PRN

## 2015-07-01 MED ORDER — HYDROMORPHONE HCL 4 MG PO TABS: 4.0000 mg | ORAL_TABLET | ORAL | Status: DC | PRN

## 2015-07-09 ENCOUNTER — Ambulatory Visit (HOSPITAL_COMMUNITY)
Admission: RE | Admit: 2015-07-09 | Discharge: 2015-07-09 | Disposition: A | Discharge status: Home / Self Care | Payer: 59 | Source: Ambulatory Visit | Attending: Hematology & Oncology | Admitting: Hematology & Oncology

## 2015-07-09 DIAGNOSIS — I10 Essential (primary) hypertension: Secondary | ICD-10-CM | POA: Insufficient documentation

## 2015-07-09 DIAGNOSIS — C50911 Malignant neoplasm of unspecified site of right female breast: Secondary | ICD-10-CM | POA: Diagnosis not present

## 2015-07-09 DIAGNOSIS — Z87891 Personal history of nicotine dependence: Secondary | ICD-10-CM | POA: Diagnosis not present

## 2015-07-09 DIAGNOSIS — R5383 Other fatigue: Secondary | ICD-10-CM | POA: Diagnosis not present

## 2015-07-09 DIAGNOSIS — C799 Secondary malignant neoplasm of unspecified site: Secondary | ICD-10-CM | POA: Diagnosis not present

## 2015-07-09 DIAGNOSIS — Z01818 Encounter for other preprocedural examination: Secondary | ICD-10-CM | POA: Insufficient documentation

## 2015-07-09 DIAGNOSIS — C50912 Malignant neoplasm of unspecified site of left female breast: Secondary | ICD-10-CM | POA: Insufficient documentation

## 2015-07-09 DIAGNOSIS — R64 Cachexia: Secondary | ICD-10-CM

## 2015-07-09 DIAGNOSIS — R112 Nausea with vomiting, unspecified: Secondary | ICD-10-CM

## 2015-07-09 DIAGNOSIS — G4701 Insomnia due to medical condition: Secondary | ICD-10-CM

## 2015-07-09 NOTE — Progress Notes (Signed)
Echocardiogram 2D Echocardiogram limited has been performed.  Bianca Kent 07/09/2015, 10:56 AM

## 2015-07-22 ENCOUNTER — Ambulatory Visit (HOSPITAL_BASED_OUTPATIENT_CLINIC_OR_DEPARTMENT_OTHER): Payer: 59

## 2015-07-22 ENCOUNTER — Other Ambulatory Visit (HOSPITAL_BASED_OUTPATIENT_CLINIC_OR_DEPARTMENT_OTHER): Payer: 59

## 2015-07-22 ENCOUNTER — Other Ambulatory Visit: Payer: Self-pay | Admitting: *Deleted

## 2015-07-22 ENCOUNTER — Ambulatory Visit (HOSPITAL_BASED_OUTPATIENT_CLINIC_OR_DEPARTMENT_OTHER): Payer: 59 | Admitting: Hematology & Oncology

## 2015-07-22 ENCOUNTER — Encounter: Payer: Self-pay | Admitting: Hematology & Oncology

## 2015-07-22 VITALS — BP 114/68 | HR 100 | Temp 98.2°F | Resp 18 | Ht 67.0 in | Wt 166.0 lb

## 2015-07-22 DIAGNOSIS — C7931 Secondary malignant neoplasm of brain: Secondary | ICD-10-CM | POA: Diagnosis not present

## 2015-07-22 DIAGNOSIS — C799 Secondary malignant neoplasm of unspecified site: Secondary | ICD-10-CM

## 2015-07-22 DIAGNOSIS — Z5112 Encounter for antineoplastic immunotherapy: Secondary | ICD-10-CM

## 2015-07-22 DIAGNOSIS — R64 Cachexia: Secondary | ICD-10-CM | POA: Diagnosis not present

## 2015-07-22 DIAGNOSIS — C773 Secondary and unspecified malignant neoplasm of axilla and upper limb lymph nodes: Secondary | ICD-10-CM

## 2015-07-22 DIAGNOSIS — C50811 Malignant neoplasm of overlapping sites of right female breast: Secondary | ICD-10-CM

## 2015-07-22 DIAGNOSIS — C50911 Malignant neoplasm of unspecified site of right female breast: Secondary | ICD-10-CM

## 2015-07-22 DIAGNOSIS — C7951 Secondary malignant neoplasm of bone: Secondary | ICD-10-CM

## 2015-07-22 DIAGNOSIS — Z5111 Encounter for antineoplastic chemotherapy: Secondary | ICD-10-CM

## 2015-07-22 DIAGNOSIS — C50912 Malignant neoplasm of unspecified site of left female breast: Secondary | ICD-10-CM | POA: Diagnosis not present

## 2015-07-22 DIAGNOSIS — R5383 Other fatigue: Secondary | ICD-10-CM

## 2015-07-22 DIAGNOSIS — G4701 Insomnia due to medical condition: Secondary | ICD-10-CM

## 2015-07-22 DIAGNOSIS — R112 Nausea with vomiting, unspecified: Secondary | ICD-10-CM

## 2015-07-22 DIAGNOSIS — Z23 Encounter for immunization: Secondary | ICD-10-CM

## 2015-07-22 LAB — CBC WITH DIFFERENTIAL (CANCER CENTER ONLY)
BASO#: 0 10*3/uL (ref 0.0–0.2)
BASO%: 0.5 % (ref 0.0–2.0)
EOS ABS: 0.1 10*3/uL (ref 0.0–0.5)
EOS%: 2.8 % (ref 0.0–7.0)
HCT: 39.2 % (ref 34.8–46.6)
HGB: 13 g/dL (ref 11.6–15.9)
LYMPH#: 0.8 10*3/uL — ABNORMAL LOW (ref 0.9–3.3)
LYMPH%: 18.5 % (ref 14.0–48.0)
MCH: 29 pg (ref 26.0–34.0)
MCHC: 33.2 g/dL (ref 32.0–36.0)
MCV: 87 fL (ref 81–101)
MONO#: 0.4 10*3/uL (ref 0.1–0.9)
MONO%: 8.9 % (ref 0.0–13.0)
NEUT#: 3 10*3/uL (ref 1.5–6.5)
NEUT%: 69.3 % (ref 39.6–80.0)
Platelets: 124 10*3/uL — ABNORMAL LOW (ref 145–400)
RBC: 4.49 10*6/uL (ref 3.70–5.32)
RDW: 17.8 % — ABNORMAL HIGH (ref 11.1–15.7)
WBC: 4.3 10*3/uL (ref 3.9–10.0)

## 2015-07-22 LAB — CMP (CANCER CENTER ONLY)
ALT(SGPT): 43 U/L (ref 10–47)
AST: 45 U/L — ABNORMAL HIGH (ref 11–38)
Albumin: 3.5 g/dL (ref 3.3–5.5)
Alkaline Phosphatase: 189 U/L — ABNORMAL HIGH (ref 26–84)
BUN, Bld: 11 mg/dL (ref 7–22)
CO2: 27 meq/L (ref 18–33)
CREATININE: 0.7 mg/dL (ref 0.6–1.2)
Calcium: 8.9 mg/dL (ref 8.0–10.3)
Chloride: 103 mEq/L (ref 98–108)
Glucose, Bld: 142 mg/dL — ABNORMAL HIGH (ref 73–118)
POTASSIUM: 3.5 meq/L (ref 3.3–4.7)
SODIUM: 138 meq/L (ref 128–145)
Total Bilirubin: 0.5 mg/dl (ref 0.20–1.60)
Total Protein: 7.3 g/dL (ref 6.4–8.1)

## 2015-07-22 MED ORDER — LIDOCAINE-PRILOCAINE 2.5-2.5 % EX CREA: 1.0000 "application " | TOPICAL_CREAM | CUTANEOUS | Status: DC | PRN

## 2015-07-22 MED ORDER — CAPECITABINE 500 MG PO TABS: ORAL_TABLET | ORAL | Status: DC

## 2015-07-22 MED ORDER — DIPHENHYDRAMINE HCL 25 MG PO CAPS
50.0000 mg | ORAL_CAPSULE | Freq: Once | ORAL | Status: AC
Start: 2015-07-22 — End: 2015-07-22
  Administered 2015-07-22: 50 mg via ORAL

## 2015-07-22 MED ORDER — ZOLEDRONIC ACID 4 MG/100ML IV SOLN
4.0000 mg | Freq: Once | INTRAVENOUS | Status: AC
  Administered 2015-07-22: 4 mg via INTRAVENOUS
  Filled 2015-07-22: qty 100

## 2015-07-22 MED ORDER — ONDANSETRON HCL 8 MG PO TABS: 8.0000 mg | ORAL_TABLET | Freq: Three times a day (TID) | ORAL | Status: DC | PRN

## 2015-07-22 MED ORDER — HEPARIN SOD (PORK) LOCK FLUSH 100 UNIT/ML IV SOLN
500.0000 [IU] | Freq: Once | INTRAVENOUS | Status: AC | PRN
  Administered 2015-07-22: 500 [IU]
  Filled 2015-07-22: qty 5

## 2015-07-22 MED ORDER — SODIUM CHLORIDE 0.9 % IV SOLN
Freq: Once | INTRAVENOUS | Status: AC
  Administered 2015-07-22: 14:00:00 via INTRAVENOUS

## 2015-07-22 MED ORDER — DIPHENHYDRAMINE HCL 25 MG PO CAPS
ORAL_CAPSULE | ORAL | Status: AC
  Filled 2015-07-22: qty 2

## 2015-07-22 MED ORDER — SODIUM CHLORIDE 0.9 % IV SOLN
330.0000 mg | Freq: Once | INTRAVENOUS | Status: AC
  Administered 2015-07-22: 330 mg via INTRAVENOUS
  Filled 2015-07-22: qty 11

## 2015-07-22 MED ORDER — INFLUENZA VAC SPLIT QUAD 0.5 ML IM SUSY
0.5000 mL | PREFILLED_SYRINGE | Freq: Once | INTRAMUSCULAR | Status: AC
  Administered 2015-07-22: 0.5 mL via INTRAMUSCULAR
  Filled 2015-07-22: qty 0.5

## 2015-07-22 MED ORDER — ACETAMINOPHEN 325 MG PO TABS
ORAL_TABLET | ORAL | Status: AC
  Filled 2015-07-22: qty 2

## 2015-07-22 MED ORDER — SODIUM CHLORIDE 0.9 % IV SOLN: 420.0000 mg | Freq: Once | INTRAVENOUS | Status: DC

## 2015-07-22 MED ORDER — TRASTUZUMAB CHEMO INJECTION 440 MG
6.0000 mg/kg | Freq: Once | INTRAVENOUS | Status: AC
  Administered 2015-07-22: 420 mg via INTRAVENOUS
  Filled 2015-07-22: qty 20

## 2015-07-22 MED ORDER — ACETAMINOPHEN 325 MG PO TABS
650.0000 mg | ORAL_TABLET | Freq: Once | ORAL | Status: AC
  Administered 2015-07-22: 650 mg via ORAL

## 2015-07-22 MED ORDER — SODIUM CHLORIDE 0.9 % IJ SOLN
10.0000 mL | INTRAMUSCULAR | Status: DC | PRN
  Administered 2015-07-22: 10 mL
  Filled 2015-07-22: qty 10

## 2015-07-22 NOTE — Progress Notes (Signed)
Hematology and Oncology Follow Up Visit  Bianca Kent [id number] 02-Mar-1966 49 y.o. 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 She looks pretty good. She still wearing oxygen.  The problem that she is having now is that she is having some nerve issues down her right arm. 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 It sounds like she is going need surgery for this. She is very worried about having surgery because of the percent for general anesthesia. I told her that she probably could have a regional type of block to be able to do that type of surgery.  She is having some chest wall discomfort over in the right upper chest wall. Of note, this might be from this nerve impingement.  She's had no positive bowels or bladder.  Her tongue is no longer bothering her.  She, unfortunately, was not taking the right dose of Xeloda. She took half the dose. Hopefully, this was still somewhat effective.  She's had no pain or tingling in the hands or feet.  She's had no nausea or vomiting. She actually was able to go to her dad's house and have a good time for a few days. Unfortunately, she cannot finish because she cannot cast due to the pain in her right shoulder and arm.   Her last CA 2729 was 21 back in July.  Medications:  Current outpatient prescriptions:  .  albuterol (PROVENTIL HFA;VENTOLIN HFA) 108 (90 BASE) MCG/ACT inhaler, Inhale 2 puffs into the lungs every 4 (four) hours as needed for wheezing or shortness of breath., Disp: 2 Inhaler, Rfl: 6 .  ALPRAZolam (XANAX) 1 MG tablet, Take 1 tablet (1 mg total) by mouth at bedtime  as needed for anxiety., Disp: 30 tablet, Rfl: 2 .  aspirin (ASPIRIN EC) 81 MG EC tablet, Take 81 mg by mouth daily. Swallow whole., Disp: , Rfl:  .  Bismuth Subsalicylate (PEPTO-BISMOL PO), Take 1 tablet by mouth as needed (upset stomach). , Disp: , Rfl:  .  calcium carbonate (TUMS - DOSED IN MG ELEMENTAL CALCIUM) 500 MG chewable tablet, Chew 2 tablets by mouth 2 (two) times daily., Disp: , Rfl:  .  capecitabine (XELODA) 500 MG tablet, Take 4 tablets with food twice a day for 14 days, Disp: 112 tablet, Rfl: 3 .  diphenhydrAMINE (SOMINEX) 25 MG tablet, Take 100 mg by mouth at bedtime as needed for sleep. Pt states she is taking up to 200 mg Benadryl at night and still not sleeping, Disp: , Rfl:  .  docusate sodium (COLACE) 100 MG capsule, Take 1 capsule (100 mg total) by mouth 2 (two) times daily as needed for mild constipation or moderate constipation., Disp: 10 capsule, Rfl: 0 .  dronabinol (MARINOL) 10 MG capsule, Take 1 capsule (10 mg total) by mouth 3 (three) times daily before meals., Disp: 90 capsule, Rfl: 0 .  ergocalciferol (VITAMIN D2) 50000 UNITS capsule, Take 1 capsule (50,000 Units total) by mouth once a week., Disp: 4 capsule, Rfl: 6 .  fentaNYL (DURAGESIC - DOSED MCG/HR) 50 MCG/HR, Place 1 patch (50 mcg total) onto the skin every  3 (three) days., Disp: 10 patch, Rfl: 0 .  HYDROmorphone (DILAUDID) 4 MG tablet, Take 1 tablet (4 mg total) by mouth every 4 (four) hours as needed for severe pain., Disp: 90 tablet, Rfl: 0 .  ibuprofen (ADVIL,MOTRIN) 600 MG tablet, Take 1 tablet (600 mg total) by mouth every 8 (eight) hours as needed for moderate pain., Disp: 30 tablet, Rfl: 0 .  Lactulose SOLN, Take 2 TBLSP every 6 hours until bowel movement, Disp: 1000 mL, Rfl: 4 .  lidocaine (LIDODERM) 5 %, Place 1 patch onto the skin daily. Remove & Discard patch within 12 hours or as directed by MD, Disp: 20 patch, Rfl: 0 .  lidocaine (XYLOCAINE) 2 % solution, Use as directed 20 mLs in the mouth or throat  as needed for mouth pain., Disp: 200 mL, Rfl: 2 .  lidocaine-prilocaine (EMLA) cream, Apply 1 application topically as needed., Disp: 30 g, Rfl: 6 .  loratadine-pseudoephedrine (CLARITIN-D 24-HOUR) 10-240 MG per 24 hr tablet, Take 1 tablet by mouth daily., Disp: , Rfl:  .  methylphenidate (RITALIN) 10 MG tablet, Take 1 tablet (10 mg total) by mouth 2 (two) times daily., Disp: 60 tablet, Rfl: 0 .  metoCLOPramide (REGLAN) 10 MG tablet, Take 1 tablet (10 mg total) by mouth every 6 (six) hours as needed for nausea., Disp: 120 tablet, Rfl: 5 .  Multiple Vitamin (MULTIVITAMIN) tablet, Take 1 tablet by mouth daily., Disp: , Rfl:  .  naloxegol oxalate (MOVANTIK) 25 MG TABS tablet, Take 1 tablet (25 mg total) by mouth daily., Disp: 30 tablet, Rfl: 6 .  NONFORMULARY OR COMPOUNDED ITEM, Take 5 mLs by mouth as needed (irritation). DUKES MOUTHWASH  SWISH AND SWALLOW, Disp: , Rfl:  .  OLANZapine (ZYPREXA) 5 MG tablet, Take 1 tablet (5 mg total) by mouth at bedtime., Disp: 30 tablet, Rfl: 3 .  ondansetron (ZOFRAN) 8 MG tablet, Take 1 tablet (8 mg total) by mouth every 8 (eight) hours as needed for nausea or vomiting., Disp: 60 tablet, Rfl: 2 .  pantoprazole (PROTONIX) 40 MG tablet, Take 1 tablet (40 mg total) by mouth daily., Disp: 30 tablet, Rfl: 6 .  pilocarpine (SALAGEN) 5 MG tablet, Take 1 tablet (5 mg total) by mouth 2 (two) times daily., Disp: 60 tablet, Rfl: 3 .  prochlorperazine (COMPAZINE) 10 MG tablet, Take 1 tablet (10 mg total) by mouth every 6 (six) hours as needed for nausea or vomiting., Disp: 60 tablet, Rfl: 2 .  promethazine (PHENERGAN) 25 MG suppository, Place 1 suppository (25 mg total) rectally every 6 (six) hours as needed for nausea or vomiting., Disp: 30 each, Rfl: 3 .  pyridoxine (B-6) 250 MG tablet, Take 1 tablet (250 mg total) by mouth daily., Disp: 90 tablet, Rfl: 4 .  traMADol (ULTRAM) 50 MG tablet, TAKE 1 TO 2 TABLETS BY MOUTH EVERY 6 HOURS AS NEEDED FOR PAIN., Disp: , Rfl: 0 .   traZODone (DESYREL) 100 MG tablet, TAKE 1 TO 2 TABLETS BY MOUTH AS NEEDED FOR SLEEP, Disp: 30 tablet, Rfl: 2 No current facility-administered medications for this visit.  Facility-Administered Medications Ordered in Other Visits:  .  sodium chloride 0.9 % injection 10 mL, 10 mL, Intracatheter, PRN, [name], MD, 10 mL at 07/22/15 1627  Allergies:  Allergies  Allergen Reactions  . Iohexol      Code: RASH, Desc: VERY STRONG FAMILY HX OF ANGIOEDEMA WHEN RECEIVING IV CONTRAST; PT HAS BEEN PREMEDICATED FOR OTHER CONTRASTED STUDIES(IN CATH. LAB)  KR, Onset Date: 13244010   .  Prednisone Itching    Capillary beds bust  . Tetanus Toxoids Other (See Comments)    Ran a high fever for 48 hours  . Theophyllines Hives    Mental changes  . Versed [Midazolam] Other (See Comments)    Pt becomes violent    Past Medical History, Surgical history, Social history, and Family History were reviewed and updated.  Review of Systems: As above  Physical Exam:  height is 5\' 7"  (1.702 m) and weight is 166 lb (75.297 kg). Her oral temperature is 98.2 F (36.8 C). Her blood pressure is 114/68 and her pulse is 100. Her respiration is 18.   Well-developed and well-nourished white female. Head and exam shows no ocular or oral lesions. She's able to move both eyes well. She has good extraocular muscle . There is no erythema or exudate from the left eye. There might be some slight fullness in the right axilla by do not detect any obvious adenopathy. I cannot detect any obvious right supraclavicular lymph nodes. She has improved range of motion of the right shoulder. Left supraclavicular region is okay. No masses are noted. Lungs are clear. Cardiac exam regular rate and rhythm with no murmurs, rubs or bruits.. Abdomen is soft. She has good bowel sounds. There is no palpable liver or spleen tip. Back exam shows decreased tenderness over the lumbar and thoracic spine to palpation. Extremities shows no clubbing,  cyanosis or edema. She has 4/5 strength in her legs. Skin exam shows improvement in the radiation dermatitis of the right breast and axilla. There is no open wound. There is some hyper pigmentation. Neurological exam is nonfocal.    Lab Results  Component Value Date   WBC 4.3 07/22/2015   HGB 13.0 07/22/2015   HCT 39.2 07/22/2015   MCV 87 07/22/2015   PLT 124* 07/22/2015     Chemistry      Component Value Date/Time   NA 138 07/22/2015 1121   NA 138 04/30/2015 1019   NA 138 03/19/2015 0312   K 3.5 07/22/2015 1121   K 3.5 04/30/2015 1019   K 3.8 03/19/2015 0312   CL 103 07/22/2015 1121   CL 101 03/19/2015 0312   CO2 27 07/22/2015 1121   CO2 29 04/30/2015 1019   CO2 29 03/19/2015 0312   BUN 11 07/22/2015 1121   BUN 9.7 04/30/2015 1019   BUN 7 03/19/2015 0312   CREATININE 0.7 07/22/2015 1121   CREATININE 0.8 04/30/2015 1019   CREATININE 0.75 03/19/2015 0312      Component Value Date/Time   CALCIUM 8.9 07/22/2015 1121   CALCIUM 9.7 04/30/2015 1019   CALCIUM 8.7* 03/19/2015 0312   ALKPHOS 189* 07/22/2015 1121   ALKPHOS 257* 04/30/2015 1019   ALKPHOS 238* 03/06/2015 0425   AST 45* 07/22/2015 1121   AST 57* 04/30/2015 1019   AST 41 03/06/2015 0425   ALT 43 07/22/2015 1121   ALT 41 04/30/2015 1019   ALT 38 03/06/2015 0425   BILITOT 0.50 07/22/2015 1121   BILITOT 0.60 04/30/2015 1019   BILITOT 0.4 03/06/2015 0425         Impression and Plan: Bianca Kent is 49 year old white female with triple positive metastatic breast cancer.  Hopefully, she will be eligible back onto the right dose of Xeloda. Hopefully, there will not be a lot of toxicity.  From my point of view, I will see any issues with her having surgery for the right shoulder since she is having neuropathy issues. I think that  she can probably have a regional type of procedure done. I think she sees her orthopedist in the next few days.  We will plan to get her back to see [place] in another 3 weeks.  I don't  think we need any scans on her right now. Her last echocardiogram was back in September and showed an ejection fraction of 55-60%.[name], MD 9/29/20165:34 PM

## 2015-07-22 NOTE — Patient Instructions (Signed)
Trastuzumab injection for infusion What is this medicine? TRASTUZUMAB (tras TOO zoo mab) is a monoclonal antibody. It targets a protein called HER2. This protein is found in some stomach and breast cancers. This medicine can stop cancer cell growth. This medicine may be used with other cancer treatments. This medicine may be used for other purposes; ask your health care provider or pharmacist if you have questions. COMMON BRAND NAME(S): Herceptin What should I tell my health care provider before I take this medicine? They need to know if you have any of these conditions: -heart disease -heart failure -infection (especially a virus infection such as chickenpox, cold sores, or herpes) -lung or breathing disease, like asthma -recent or ongoing radiation therapy -an unusual or allergic reaction to trastuzumab, benzyl alcohol, or other medications, foods, dyes, or preservatives -pregnant or trying to get pregnant -breast-feeding How should I use this medicine? This drug is given as an infusion into a vein. It is administered in a hospital or clinic by a specially trained health care professional. Talk to your pediatrician regarding the use of this medicine in children. This medicine is not approved for use in children. Overdosage: If you think you have taken too much of this medicine contact a poison control center or emergency room at once. NOTE: This medicine is only for you. Do not share this medicine with others. What if I miss a dose? It is important not to miss a dose. Call your doctor or health care professional if you are unable to keep an appointment. What may interact with this medicine? -cyclophosphamide -doxorubicin -warfarin This list may not describe all possible interactions. Give your health care provider a list of all the medicines, herbs, non-prescription drugs, or dietary supplements you use. Also tell them if you smoke, drink alcohol, or use illegal drugs. Some items may  interact with your medicine. What should I watch for while using this medicine? Visit your doctor for checks on your progress. Report any side effects. Continue your course of treatment even though you feel ill unless your doctor tells you to stop. Call your doctor or health care professional for advice if you get a fever, chills or sore throat, or other symptoms of a cold or flu. Do not treat yourself. Try to avoid being around people who are sick. You may experience fever, chills and shaking during your first infusion. These effects are usually mild and can be treated with other medicines. Report any side effects during the infusion to your health care professional. Fever and chills usually do not happen with later infusions. What side effects may I notice from receiving this medicine? Side effects that you should report to your doctor or other health care professional as soon as possible: -breathing difficulties -chest pain or palpitations -cough -dizziness or fainting -fever or chills, sore throat -skin rash, itching or hives -swelling of the legs or ankles -unusually weak or tired Side effects that usually do not require medical attention (report to your doctor or other health care professional if they continue or are bothersome): -loss of appetite -headache -muscle aches -nausea This list may not describe all possible side effects. Call your doctor for medical advice about side effects. You may report side effects to FDA at 1-800-FDA-1088. Where should I keep my medicine? This drug is given in a hospital or clinic and will not be stored at home. NOTE: This sheet is a summary. It may not cover all possible information. If you have questions about this medicine, talk   to your doctor, pharmacist, or health care provider.  2015, Elsevier/Gold Standard. (2009-08-13 13:43:15) Pertuzumab injection What is this medicine? PERTUZUMAB (per TOOZ ue mab) is a monoclonal antibody that targets a  protein called HER2. HER2 is found in some breast cancers. This medicine can stop cancer cell growth. This medicine is used with other cancer treatments. This medicine may be used for other purposes; ask your health care provider or pharmacist if you have questions. COMMON BRAND NAME(S): PERJETA What should I tell my health care provider before I take this medicine? They need to know if you have any of these conditions: -heart disease -heart failure -high blood pressure -history of irregular heart beat -recent or ongoing radiation therapy -an unusual or allergic reaction to pertuzumab, other medicines, foods, dyes, or preservatives -pregnant or trying to get pregnant -breast-feeding How should I use this medicine? This medicine is for infusion into a vein. It is given by a health care professional in a hospital or clinic setting. Talk to your pediatrician regarding the use of this medicine in children. Special care may be needed. Overdosage: If you think you've taken too much of this medicine contact a poison control center or emergency room at once. Overdosage: If you think you have taken too much of this medicine contact a poison control center or emergency room at once. NOTE: This medicine is only for you. Do not share this medicine with others. What if I miss a dose? It is important not to miss your dose. Call your doctor or health care professional if you are unable to keep an appointment. What may interact with this medicine? Interactions are not expected. Give your health care provider a list of all the medicines, herbs, non-prescription drugs, or dietary supplements you use. Also tell them if you smoke, drink alcohol, or use illegal drugs. Some items may interact with your medicine. This list may not describe all possible interactions. Give your health care provider a list of all the medicines, herbs, non-prescription drugs, or dietary supplements you use. Also tell them if you smoke,  drink alcohol, or use illegal drugs. Some items may interact with your medicine. What should I watch for while using this medicine? Your condition will be monitored carefully while you are receiving this medicine. Report any side effects. Continue your course of treatment even though you feel ill unless your doctor tells you to stop. Do not become pregnant while taking this medicine. Women should inform their doctor if they wish to become pregnant or think they might be pregnant. There is a potential for serious side effects to an unborn child. Talk to your health care professional or pharmacist for more information. Do not breast-feed an infant while taking this medicine. Call your doctor or health care professional for advice if you get a fever, chills or sore throat, or other symptoms of a cold or flu. Do not treat yourself. Try to avoid being around people who are sick. You may experience fever, chills, and headache during the infusion. Report any side effects during the infusion to your health care professional. What side effects may I notice from receiving this medicine? Side effects that you should report to your doctor or health care professional as soon as possible: -breathing problems -chest pain or palpitations -dizziness -feeling faint or lightheaded -fever or chills -skin rash, itching or hives -sore throat -swelling of the face, lips, or tongue -swelling of the legs or ankles -unusually weak or tired Side effects that usually do not require  medical attention (Report these to your doctor or health care professional if they continue or are bothersome.): -diarrhea -hair loss -nausea, vomiting -tiredness This list may not describe all possible side effects. Call your doctor for medical advice about side effects. You may report side effects to FDA at 1-800-FDA-1088. Where should I keep my medicine? This drug is given in a hospital or clinic and will not be stored at home. NOTE:  This sheet is a summary. It may not cover all possible information. If you have questions about this medicine, talk to your doctor, pharmacist, or health care provider.  2015, Elsevier/Gold Standard. (2012-08-07 16:54:15) Zoledronic Acid injection (Hypercalcemia, Oncology) What is this medicine? ZOLEDRONIC ACID (ZOE le dron ik AS id) lowers the amount of calcium loss from bone. It is used to treat too much calcium in your blood from cancer. It is also used to prevent complications of cancer that has spread to the bone. This medicine may be used for other purposes; ask your health care provider or pharmacist if you have questions. COMMON BRAND NAME(S): Zometa What should I tell my health care provider before I take this medicine? They need to know if you have any of these conditions: -aspirin-sensitive asthma -cancer, especially if you are receiving medicines used to treat cancer -dental disease or wear dentures -infection -kidney disease -receiving corticosteroids like dexamethasone or prednisone -an unusual or allergic reaction to zoledronic acid, other medicines, foods, dyes, or preservatives -pregnant or trying to get pregnant -breast-feeding How should I use this medicine? This medicine is for infusion into a vein. It is given by a health care professional in a hospital or clinic setting. Talk to your pediatrician regarding the use of this medicine in children. Special care may be needed. Overdosage: If you think you have taken too much of this medicine contact a poison control center or emergency room at once. NOTE: This medicine is only for you. Do not share this medicine with others. What if I miss a dose? It is important not to miss your dose. Call your doctor or health care professional if you are unable to keep an appointment. What may interact with this medicine? -certain antibiotics given by injection -NSAIDs, medicines for pain and inflammation, like ibuprofen or  naproxen -some diuretics like bumetanide, furosemide -teriparatide -thalidomide This list may not describe all possible interactions. Give your health care provider a list of all the medicines, herbs, non-prescription drugs, or dietary supplements you use. Also tell them if you smoke, drink alcohol, or use illegal drugs. Some items may interact with your medicine. What should I watch for while using this medicine? Visit your doctor or health care professional for regular checkups. It may be some time before you see the benefit from this medicine. Do not stop taking your medicine unless your doctor tells you to. Your doctor may order blood tests or other tests to see how you are doing. Women should inform their doctor if they wish to become pregnant or think they might be pregnant. There is a potential for serious side effects to an unborn child. Talk to your health care professional or pharmacist for more information. You should make sure that you get enough calcium and vitamin D while you are taking this medicine. Discuss the foods you eat and the vitamins you take with your health care professional. Some people who take this medicine have severe bone, joint, and/or muscle pain. This medicine may also increase your risk for jaw problems or a broken thigh  bone. Tell your doctor right away if you have severe pain in your jaw, bones, joints, or muscles. Tell your doctor if you have any pain that does not go away or that gets worse. Tell your dentist and dental surgeon that you are taking this medicine. You should not have major dental surgery while on this medicine. See your dentist to have a dental exam and fix any dental problems before starting this medicine. Take good care of your teeth while on this medicine. Make sure you see your dentist for regular follow-up appointments. What side effects may I notice from receiving this medicine? Side effects that you should report to your doctor or health care  professional as soon as possible: -allergic reactions like skin rash, itching or hives, swelling of the face, lips, or tongue -anxiety, confusion, or depression -breathing problems -changes in vision -eye pain -feeling faint or lightheaded, falls -jaw pain, especially after dental work -mouth sores -muscle cramps, stiffness, or weakness -trouble passing urine or change in the amount of urine Side effects that usually do not require medical attention (report to your doctor or health care professional if they continue or are bothersome): -bone, joint, or muscle pain -constipation -diarrhea -fever -hair loss -irritation at site where injected -loss of appetite -nausea, vomiting -stomach upset -trouble sleeping -trouble swallowing -weak or tired This list may not describe all possible side effects. Call your doctor for medical advice about side effects. You may report side effects to FDA at 1-800-FDA-1088. Where should I keep my medicine? This drug is given in a hospital or clinic and will not be stored at home. NOTE: This sheet is a summary. It may not cover all possible information. If you have questions about this medicine, talk to your doctor, pharmacist, or health care provider.  2015, Elsevier/Gold Standard. (2013-03-20 13:03:13)

## 2015-08-13 ENCOUNTER — Other Ambulatory Visit (HOSPITAL_BASED_OUTPATIENT_CLINIC_OR_DEPARTMENT_OTHER): Payer: 59

## 2015-08-13 ENCOUNTER — Ambulatory Visit (HOSPITAL_BASED_OUTPATIENT_CLINIC_OR_DEPARTMENT_OTHER): Payer: 59

## 2015-08-13 ENCOUNTER — Ambulatory Visit (HOSPITAL_BASED_OUTPATIENT_CLINIC_OR_DEPARTMENT_OTHER): Payer: 59 | Admitting: Hematology & Oncology

## 2015-08-13 ENCOUNTER — Encounter: Payer: Self-pay | Admitting: Hematology & Oncology

## 2015-08-13 VITALS — BP 104/65 | HR 78 | Temp 98.3°F | Resp 16 | Ht 67.0 in

## 2015-08-13 DIAGNOSIS — R112 Nausea with vomiting, unspecified: Secondary | ICD-10-CM

## 2015-08-13 DIAGNOSIS — C50911 Malignant neoplasm of unspecified site of right female breast: Secondary | ICD-10-CM

## 2015-08-13 DIAGNOSIS — R5383 Other fatigue: Secondary | ICD-10-CM

## 2015-08-13 DIAGNOSIS — Z23 Encounter for immunization: Secondary | ICD-10-CM

## 2015-08-13 DIAGNOSIS — R7989 Other specified abnormal findings of blood chemistry: Secondary | ICD-10-CM | POA: Diagnosis not present

## 2015-08-13 DIAGNOSIS — C7931 Secondary malignant neoplasm of brain: Secondary | ICD-10-CM

## 2015-08-13 DIAGNOSIS — C50912 Malignant neoplasm of unspecified site of left female breast: Secondary | ICD-10-CM

## 2015-08-13 DIAGNOSIS — Z170 Estrogen receptor positive status [ER+]: Secondary | ICD-10-CM | POA: Diagnosis not present

## 2015-08-13 DIAGNOSIS — C7951 Secondary malignant neoplasm of bone: Secondary | ICD-10-CM | POA: Diagnosis not present

## 2015-08-13 DIAGNOSIS — Z5112 Encounter for antineoplastic immunotherapy: Secondary | ICD-10-CM

## 2015-08-13 DIAGNOSIS — R64 Cachexia: Secondary | ICD-10-CM

## 2015-08-13 DIAGNOSIS — G4701 Insomnia due to medical condition: Secondary | ICD-10-CM

## 2015-08-13 LAB — CBC WITH DIFFERENTIAL (CANCER CENTER ONLY)
BASO#: 0 10*3/uL (ref 0.0–0.2)
BASO%: 0.4 % (ref 0.0–2.0)
EOS%: 2.9 % (ref 0.0–7.0)
Eosinophils Absolute: 0.1 10*3/uL (ref 0.0–0.5)
HCT: 38.2 % (ref 34.8–46.6)
HGB: 12.5 g/dL (ref 11.6–15.9)
LYMPH#: 1.1 10*3/uL (ref 0.9–3.3)
LYMPH%: 22.6 % (ref 14.0–48.0)
MCH: 29.4 pg (ref 26.0–34.0)
MCHC: 32.7 g/dL (ref 32.0–36.0)
MCV: 90 fL (ref 81–101)
MONO#: 0.5 10*3/uL (ref 0.1–0.9)
MONO%: 10.6 % (ref 0.0–13.0)
NEUT#: 3.1 10*3/uL (ref 1.5–6.5)
NEUT%: 63.5 % (ref 39.6–80.0)
PLATELETS: 98 10*3/uL — AB (ref 145–400)
RBC: 4.25 10*6/uL (ref 3.70–5.32)
RDW: 18.7 % — ABNORMAL HIGH (ref 11.1–15.7)
WBC: 4.8 10*3/uL (ref 3.9–10.0)

## 2015-08-13 LAB — CMP (CANCER CENTER ONLY)
ALK PHOS: 183 U/L — AB (ref 26–84)
ALT: 119 U/L — AB (ref 10–47)
AST: 88 U/L — ABNORMAL HIGH (ref 11–38)
Albumin: 3.5 g/dL (ref 3.3–5.5)
BUN: 11 mg/dL (ref 7–22)
CO2: 28 mEq/L (ref 18–33)
CREATININE: 1 mg/dL (ref 0.6–1.2)
Calcium: 8.5 mg/dL (ref 8.0–10.3)
Chloride: 104 mEq/L (ref 98–108)
Glucose, Bld: 85 mg/dL (ref 73–118)
Potassium: 4.1 mEq/L (ref 3.3–4.7)
SODIUM: 142 meq/L (ref 128–145)
TOTAL PROTEIN: 6.9 g/dL (ref 6.4–8.1)
Total Bilirubin: 0.6 mg/dl (ref 0.20–1.60)

## 2015-08-13 MED ORDER — SODIUM CHLORIDE 0.9 % IJ SOLN
10.0000 mL | INTRAMUSCULAR | Status: DC | PRN
  Administered 2015-08-13: 10 mL
  Filled 2015-08-13: qty 10

## 2015-08-13 MED ORDER — DIPHENHYDRAMINE HCL 25 MG PO CAPS
50.0000 mg | ORAL_CAPSULE | Freq: Once | ORAL | Status: AC
  Administered 2015-08-13: 50 mg via ORAL

## 2015-08-13 MED ORDER — ACETAMINOPHEN 325 MG PO TABS
ORAL_TABLET | ORAL | Status: AC
  Filled 2015-08-13: qty 2

## 2015-08-13 MED ORDER — ZOLEDRONIC ACID 4 MG/100ML IV SOLN
4.0000 mg | Freq: Once | INTRAVENOUS | Status: AC
  Administered 2015-08-13: 4 mg via INTRAVENOUS
  Filled 2015-08-13: qty 100

## 2015-08-13 MED ORDER — PROMETHAZINE HCL 25 MG RE SUPP: 25.0000 mg | Freq: Four times a day (QID) | RECTAL | Status: AC | PRN

## 2015-08-13 MED ORDER — ACETAMINOPHEN 325 MG PO TABS
650.0000 mg | ORAL_TABLET | Freq: Once | ORAL | Status: AC
  Administered 2015-08-13: 650 mg via ORAL

## 2015-08-13 MED ORDER — FENTANYL 50 MCG/HR TD PT72: 50.0000 ug | MEDICATED_PATCH | TRANSDERMAL | Status: DC

## 2015-08-13 MED ORDER — SODIUM CHLORIDE 0.9 % IV SOLN
Freq: Once | INTRAVENOUS | Status: AC
  Administered 2015-08-13: 11:00:00 via INTRAVENOUS

## 2015-08-13 MED ORDER — TRASTUZUMAB CHEMO INJECTION 440 MG
6.0000 mg/kg | Freq: Once | INTRAVENOUS | Status: AC
  Administered 2015-08-13: 420 mg via INTRAVENOUS
  Filled 2015-08-13: qty 20

## 2015-08-13 MED ORDER — SODIUM CHLORIDE 0.9 % IV SOLN
420.0000 mg | Freq: Once | INTRAVENOUS | Status: AC
  Administered 2015-08-13: 420 mg via INTRAVENOUS
  Filled 2015-08-13: qty 14

## 2015-08-13 MED ORDER — HEPARIN SOD (PORK) LOCK FLUSH 100 UNIT/ML IV SOLN
500.0000 [IU] | Freq: Once | INTRAVENOUS | Status: AC | PRN
  Administered 2015-08-13: 500 [IU]
  Filled 2015-08-13: qty 5

## 2015-08-13 MED ORDER — DIPHENHYDRAMINE HCL 25 MG PO CAPS
ORAL_CAPSULE | ORAL | Status: AC
  Filled 2015-08-13: qty 2

## 2015-08-13 NOTE — Progress Notes (Signed)
Hematology and Oncology Follow Up Visit  Bianca Kent [id number] 1966/01/11 49 y.o. 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  Bobak is back for followup . She looks pretty good. She still wearing oxygen.  She tolerated the last go-round of Xeloda pretty well. She is due to start her third cycle today.  Her liver tests are low bit on the high side. I told her to delay the Xeloda one week and then to start on the 28th.  Her pain seems doing a little bit better. She still having a lot of pain over with the right shoulder area. His only she may need a little surgery for this.   She is on a Duragesic patch. I will have her take the Duragesic patch every other day and 7 every third day.  She has a little bit of diarrhea.  She's had no bleeding.  Her appetite is doing okay. The Marinol really helps with this.  Phenergan suppositories seem to help the most with nausea.  Her last CA 27.29 was 21 in July. I am rechecking it today.  She had a echocardiogram done in September. She had an excellent ejection fraction of 55-60%.  Her last PET scan was done [place] June. I think we will have to repeat this.  Overall, her performance status is ECOG 1. Her last CA 2729 was 21 back in July.  Medications:  Current outpatient prescriptions:  .  albuterol (PROVENTIL HFA;VENTOLIN HFA) 108 (90 BASE) MCG/ACT inhaler, Inhale 2 puffs into the lungs every 4 (four) hours as needed for wheezing or shortness of breath., Disp: 2 Inhaler, Rfl: 6 .  ALPRAZolam (XANAX) 1 MG tablet, Take 1 tablet (1 mg total) by mouth at bedtime as needed for anxiety., Disp: 30 tablet, Rfl: 2 .   aspirin (ASPIRIN EC) 81 MG EC tablet, Take 81 mg by mouth daily. Swallow whole., Disp: , Rfl:  .  Bismuth Subsalicylate (PEPTO-BISMOL PO), Take 1 tablet by mouth as needed (upset stomach). , Disp: , Rfl:  .  calcium carbonate (TUMS - DOSED IN MG ELEMENTAL CALCIUM) 500 MG chewable tablet, Chew 2 tablets by mouth 2 (two) times daily., Disp: , Rfl:  .  capecitabine (XELODA) 500 MG tablet, Take 4 tablets with food twice a day for 14 days, Disp: 112 tablet, Rfl: 3 .  diphenhydrAMINE (SOMINEX) 25 MG tablet, Take 100 mg by mouth at bedtime as needed for sleep. Pt states she is taking up to 200 mg Benadryl at night and still not sleeping, Disp: , Rfl:  .  docusate sodium (COLACE) 100 MG capsule, Take 1 capsule (100 mg total) by mouth 2 (two) times daily as needed for mild constipation or moderate constipation., Disp: 10 capsule, Rfl: 0 .  dronabinol (MARINOL) 10 MG capsule, Take 1 capsule (10 mg total) by mouth 3 (three) times daily before meals., Disp: 90 capsule, Rfl: 0 .  ergocalciferol (VITAMIN D2) 50000 UNITS capsule, Take 1 capsule (50,000 Units total) by mouth once a week., Disp: 4 capsule, Rfl: 6 .  fentaNYL (DURAGESIC - DOSED MCG/HR) 50 MCG/HR, Place 1 patch (50 mcg total) onto the skin  every other day., Disp: 15 patch, Rfl: 0 .  HYDROmorphone (DILAUDID) 4 MG tablet, Take 1 tablet (4 mg total) by mouth every 4 (four) hours as needed for severe pain., Disp: 90 tablet, Rfl: 0 .  ibuprofen (ADVIL,MOTRIN) 600 MG tablet, Take 1 tablet (600 mg total) by mouth every 8 (eight) hours as needed for moderate pain., Disp: 30 tablet, Rfl: 0 .  Lactulose SOLN, Take 2 TBLSP every 6 hours until bowel movement, Disp: 1000 mL, Rfl: 4 .  lidocaine (LIDODERM) 5 %, Place 1 patch onto the skin daily. Remove & Discard patch within 12 hours or as directed by MD, Disp: 20 patch, Rfl: 0 .  lidocaine (XYLOCAINE) 2 % solution, Use as directed 20 mLs in the mouth or throat as needed for mouth pain., Disp: 200 mL, Rfl: 2 .   lidocaine-prilocaine (EMLA) cream, Apply 1 application topically as needed., Disp: 30 g, Rfl: 6 .  loratadine-pseudoephedrine (CLARITIN-D 24-HOUR) 10-240 MG per 24 hr tablet, Take 1 tablet by mouth daily., Disp: , Rfl:  .  methylphenidate (RITALIN) 10 MG tablet, Take 1 tablet (10 mg total) by mouth 2 (two) times daily., Disp: 60 tablet, Rfl: 0 .  metoCLOPramide (REGLAN) 10 MG tablet, Take 1 tablet (10 mg total) by mouth every 6 (six) hours as needed for nausea., Disp: 120 tablet, Rfl: 5 .  Multiple Vitamin (MULTIVITAMIN) tablet, Take 1 tablet by mouth daily., Disp: , Rfl:  .  naloxegol oxalate (MOVANTIK) 25 MG TABS tablet, Take 1 tablet (25 mg total) by mouth daily., Disp: 30 tablet, Rfl: 6 .  NONFORMULARY OR COMPOUNDED ITEM, Take 5 mLs by mouth as needed (irritation). DUKES MOUTHWASH  SWISH AND SWALLOW, Disp: , Rfl:  .  OLANZapine (ZYPREXA) 5 MG tablet, Take 1 tablet (5 mg total) by mouth at bedtime., Disp: 30 tablet, Rfl: 3 .  ondansetron (ZOFRAN) 8 MG tablet, Take 1 tablet (8 mg total) by mouth every 8 (eight) hours as needed for nausea or vomiting., Disp: 60 tablet, Rfl: 2 .  pantoprazole (PROTONIX) 40 MG tablet, Take 1 tablet (40 mg total) by mouth daily., Disp: 30 tablet, Rfl: 6 .  pilocarpine (SALAGEN) 5 MG tablet, Take 1 tablet (5 mg total) by mouth 2 (two) times daily., Disp: 60 tablet, Rfl: 3 .  prochlorperazine (COMPAZINE) 10 MG tablet, Take 1 tablet (10 mg total) by mouth every 6 (six) hours as needed for nausea or vomiting., Disp: 60 tablet, Rfl: 2 .  promethazine (PHENERGAN) 25 MG suppository, Place 1 suppository (25 mg total) rectally every 6 (six) hours as needed for nausea or vomiting., Disp: 60 each, Rfl: 3 .  pyridoxine (B-6) 250 MG tablet, Take 1 tablet (250 mg total) by mouth daily., Disp: 90 tablet, Rfl: 4 .  traMADol (ULTRAM) 50 MG tablet, TAKE 1 TO 2 TABLETS BY MOUTH EVERY 6 HOURS AS NEEDED FOR PAIN., Disp: , Rfl: 0 .  traZODone (DESYREL) 100 MG tablet, TAKE 1 TO 2 TABLETS BY  MOUTH AS NEEDED FOR SLEEP, Disp: 30 tablet, Rfl: 2  Allergies:  Allergies  Allergen Reactions  . Iohexol      Code: RASH, Desc: VERY STRONG FAMILY HX OF ANGIOEDEMA WHEN RECEIVING IV CONTRAST; PT HAS BEEN PREMEDICATED FOR OTHER CONTRASTED STUDIES(IN CATH. LAB)  KR, Onset Date: [id number]   . Prednisone Itching    Capillary beds bust  . Tetanus Toxoids Other (See Comments)    Ran a high fever for 48 hours  . Theophyllines Hives    Mental changes  .  Versed [Midazolam] Other (See Comments)    Pt becomes violent    Past Medical History, Surgical history, Social history, and Family History were reviewed and updated.  Review of Systems: As above  Physical Exam:  height is 5\' 7"  (1.702 m). Her oral temperature is 98.3 F (36.8 C). Her blood pressure is 104/65 and her pulse is 78. Her respiration is 16.   Well-developed and well-nourished white female. Head and exam shows no ocular or oral lesions. She's able to move both eyes well. She has good extraocular muscle . There is no erythema or exudate from the left eye. There might be some slight fullness in the right axilla by do not detect any obvious adenopathy. I cannot detect any obvious right supraclavicular lymph nodes. She has improved range of motion of the right shoulder. Left supraclavicular region is okay. No masses are noted. Lungs are clear. Cardiac exam regular rate and rhythm with no murmurs, rubs or bruits.. Abdomen is soft. She has good bowel sounds. There is no palpable liver or spleen tip. Back exam shows decreased tenderness over the lumbar and thoracic spine to palpation. Extremities shows no clubbing, cyanosis or edema. She has 4/5 strength in her legs. Skin exam shows improvement in the radiation dermatitis of the right breast and axilla. There is no open wound. There is some hyper pigmentation. Neurological exam is nonfocal.    Lab Results  Component Value Date   WBC 4.8 08/13/2015   HGB 12.5 08/13/2015   HCT 38.2  08/13/2015   MCV 90 08/13/2015   PLT 98* 08/13/2015     Chemistry      Component Value Date/Time   NA 142 08/13/2015 0850   NA 138 04/30/2015 1019   NA 138 03/19/2015 0312   K 4.1 08/13/2015 0850   K 3.5 04/30/2015 1019   K 3.8 03/19/2015 0312   CL 104 08/13/2015 0850   CL 101 03/19/2015 0312   CO2 28 08/13/2015 0850   CO2 29 04/30/2015 1019   CO2 29 03/19/2015 0312   BUN 11 08/13/2015 0850   BUN 9.7 04/30/2015 1019   BUN 7 03/19/2015 0312   CREATININE 1.0 08/13/2015 0850   CREATININE 0.8 04/30/2015 1019   CREATININE 0.75 03/19/2015 0312      Component Value Date/Time   CALCIUM 8.5 08/13/2015 0850   CALCIUM 9.7 04/30/2015 1019   CALCIUM 8.7* 03/19/2015 0312   ALKPHOS 183* 08/13/2015 0850   ALKPHOS 257* 04/30/2015 1019   ALKPHOS 238* 03/06/2015 0425   AST 88* 08/13/2015 0850   AST 57* 04/30/2015 1019   AST 41 03/06/2015 0425   ALT 119* 08/13/2015 0850   ALT 41 04/30/2015 1019   ALT 38 03/06/2015 0425   BILITOT 0.60 08/13/2015 0850   BILITOT 0.60 04/30/2015 1019   BILITOT 0.4 03/06/2015 0425         Impression and Plan: Ms. Hamric is 49 year old white female with triple positive metastatic breast cancer.  Again, we will have her hold the Xeloda for a week. Hopefully, this will help her liver function tests.  With the holidays coming up, we will make an adjustment to her schedule.  I'll plan to see her back on December 2.   She will be going to [place] for Thanksgiving.   [name], MD 10/21/201610:41 AM

## 2015-08-13 NOTE — Patient Instructions (Signed)
[facility] Discharge Instructions for Patients Receiving Chemotherapy  Today you received the following chemotherapy agents Herceptin, Perjeta,   To help prevent nausea and vomiting after your treatment, we encourage you to take your nausea medication    If you develop nausea and vomiting that is not controlled by your nausea medication, call the clinic.   BELOW ARE SYMPTOMS THAT SHOULD BE REPORTED IMMEDIATELY:  *FEVER GREATER THAN 100.5 F  *CHILLS WITH OR WITHOUT FEVER  NAUSEA AND VOMITING THAT IS NOT CONTROLLED WITH YOUR NAUSEA MEDICATION  *UNUSUAL SHORTNESS OF BREATH  *UNUSUAL BRUISING OR BLEEDING  TENDERNESS IN MOUTH AND THROAT WITH OR WITHOUT PRESENCE OF ULCERS  *URINARY PROBLEMS  *BOWEL PROBLEMS  UNUSUAL RASH Items with * indicate a potential emergency and should be followed up as soon as possible.  Feel free to call the clinic you have any questions or concerns. The clinic phone number is [phone].  Please show the [facility] at check-in to the Emergency Department and triage nurse.

## 2015-08-17 ENCOUNTER — Ambulatory Visit
Admission: RE | Admit: 2015-08-17 | Discharge: 2015-08-17 | Disposition: A | Discharge status: Home / Self Care | Payer: 59 | Source: Ambulatory Visit | Attending: Radiation Oncology | Admitting: Radiation Oncology

## 2015-08-17 ENCOUNTER — Ambulatory Visit: Admission: RE | Admit: 2015-08-17 | Payer: 59 | Source: Ambulatory Visit

## 2015-08-17 DIAGNOSIS — C7931 Secondary malignant neoplasm of brain: Secondary | ICD-10-CM

## 2015-08-17 MED ORDER — GADOBENATE DIMEGLUMINE 529 MG/ML IV SOLN
16.0000 mL | Freq: Once | INTRAVENOUS | Status: AC | PRN
  Administered 2015-08-17: 16 mL via INTRAVENOUS

## 2015-08-20 ENCOUNTER — Ambulatory Visit
Admission: RE | Admit: 2015-08-20 | Discharge: 2015-08-20 | Disposition: A | Discharge status: Home / Self Care | Payer: 59 | Source: Ambulatory Visit | Attending: Radiation Oncology | Admitting: Radiation Oncology

## 2015-08-20 ENCOUNTER — Encounter: Payer: Self-pay | Admitting: Radiation Oncology

## 2015-08-20 VITALS — BP 125/71 | HR 90 | Temp 97.7°F | Ht 67.0 in | Wt 172.8 lb

## 2015-08-20 DIAGNOSIS — C7931 Secondary malignant neoplasm of brain: Secondary | ICD-10-CM

## 2015-08-20 NOTE — Progress Notes (Signed)
Bianca Kent is here for follow-up. 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 She had an MRI on Friday 08/17/15 and is here for results. She reports eating well. She has generalized pain, especially in her right shoulder. She has a 3mcg Fentanyl patch in place, and using dilaudid po to help relieve her pain which stays at a 3 per her report. She is using oxygen at 2 Liters with a saturation of 99% today.   BP 125/71 mmHg  Pulse 90  Temp(Src) 97.7 F (36.5 C)  Ht 5\' 7"  (1.702 m)  Wt 172 lb 12.8 oz (78.382 kg)  BMI 27.06 kg/m2  SpO2 99%  LMP 04/22/2014   Wt Readings from Last 3 Encounters:  08/20/15 172 lb 12.8 oz (78.382 kg)  07/22/15 166 lb (75.297 kg)  06/24/15 158 lb (71.668 kg)

## 2015-08-20 NOTE — Progress Notes (Signed)
Radiation Oncology         [phone] ________________________________  Name: Bianca Kent MRN: [id number]  Date: 08/20/2015  DOB: March 24, 1966  Follow-Up Visit Note  Outpatient  CC: [name], MD  [name], MD  Diagnosis and Prior Radiotherapy:    ICD-9-CM ICD-10-CM   1. Brain metastasis ([place]) 198.3 C79.31      Brain metastasis, Grade III triple positive breast cancer   Indication for treatment:  palliative       Radiation treatment dates: 12/16/2014: Left temporal 72mm tumor / 20 Gy in 1 fraction      08/01/2014-09/16/2014: Right breast and axillary and supraclavicular region 45 gray in 25 fractions; the adenopathy in the axillary area was boosted to 50.4 gray per Dr. Sondra Come.  Narrative:  The patient returns today for routine follow-up. She had an MRI on Friday 08/17/15 and is here for the results. She reports eating well. She has generalized pain, especially in her right shoulder. She has a 20mcg Fentanyl patch in place and using dilaudid po to help relieve her pain, which stays as a 3/10 per her report. She is using oxygen at 2 Liters with a saturation of 99% today.  MRI of the brain on 08/17/15 demonstrates a punctate left temporal lobe lesion. This was the area that was treated previously. No new metastases. Her Xeloda is on hold per Dr. [name]. PET scan is pending on 09/22/15.  MRI of her right shoulder on 02/23/15 revealed no evidence of metastatic disease. However, it did show other orthopedic issues.  The patient reports she is still taking chemotherapy and has not seen a pulmonologist since she started to see Dr. [name].  No other complaints.  ALLERGIES:  is allergic to iohexol; prednisone; tetanus toxoids; theophyllines; and versed.  Meds: Current Outpatient Prescriptions  Medication Sig Dispense Refill  . albuterol (PROVENTIL HFA;VENTOLIN HFA) 108 (90 BASE) MCG/ACT inhaler Inhale 2 puffs into the lungs every 4 (four) hours as needed for wheezing  or shortness of breath. 2 Inhaler 6  . ALPRAZolam (XANAX) 1 MG tablet Take 1 tablet (1 mg total) by mouth at bedtime as needed for anxiety. 30 tablet 2  . aspirin (ASPIRIN EC) 81 MG EC tablet Take 81 mg by mouth daily. Swallow whole.    . Bismuth Subsalicylate (PEPTO-BISMOL PO) Take 1 tablet by mouth as needed (upset stomach).     . calcium carbonate (TUMS - DOSED IN MG ELEMENTAL CALCIUM) 500 MG chewable tablet Chew 2 tablets by mouth 2 (two) times daily.    . capecitabine (XELODA) 500 MG tablet Take 4 tablets with food twice a day for 14 days 112 tablet 3  . diphenhydrAMINE (SOMINEX) 25 MG tablet Take 100 mg by mouth at bedtime as needed for sleep. Pt states she is taking up to 200 mg Benadryl at night and still not sleeping    . docusate sodium (COLACE) 100 MG capsule Take 1 capsule (100 mg total) by mouth 2 (two) times daily as needed for mild constipation or moderate constipation. 10 capsule 0  . dronabinol (MARINOL) 10 MG capsule Take 1 capsule (10 mg total) by mouth 3 (three) times daily before meals. 90 capsule 0  . ergocalciferol (VITAMIN D2) 50000 UNITS capsule Take 1 capsule (50,000 Units total) by mouth once a week. 4 capsule 6  . fentaNYL (DURAGESIC - DOSED MCG/HR) 50 MCG/HR Place 1 patch (50 mcg total) onto the skin every other day. 15 patch 0  . HYDROmorphone (DILAUDID) 4 MG tablet  Take 1 tablet (4 mg total) by mouth every 4 (four) hours as needed for severe pain. 90 tablet 0  . ibuprofen (ADVIL,MOTRIN) 600 MG tablet Take 1 tablet (600 mg total) by mouth every 8 (eight) hours as needed for moderate pain. 30 tablet 0  . Lactulose SOLN Take 2 TBLSP every 6 hours until bowel movement 1000 mL 4  . lidocaine (LIDODERM) 5 % Place 1 patch onto the skin daily. Remove & Discard patch within 12 hours or as directed by MD 20 patch 0  . lidocaine (XYLOCAINE) 2 % solution Use as directed 20 mLs in the mouth or throat as needed for mouth pain. 200 mL 2  . lidocaine-prilocaine (EMLA) cream Apply 1  application topically as needed. 30 g 6  . loratadine-pseudoephedrine (CLARITIN-D 24-HOUR) 10-240 MG per 24 hr tablet Take 1 tablet by mouth daily.    . methylphenidate (RITALIN) 10 MG tablet Take 1 tablet (10 mg total) by mouth 2 (two) times daily. 60 tablet 0  . metoCLOPramide (REGLAN) 10 MG tablet Take 1 tablet (10 mg total) by mouth every 6 (six) hours as needed for nausea. 120 tablet 5  . Multiple Vitamin (MULTIVITAMIN) tablet Take 1 tablet by mouth daily.    . naloxegol oxalate (MOVANTIK) 25 MG TABS tablet Take 1 tablet (25 mg total) by mouth daily. 30 tablet 6  . NONFORMULARY OR COMPOUNDED ITEM Take 5 mLs by mouth as needed (irritation). DUKES MOUTHWASH  SWISH AND SWALLOW    . OLANZapine (ZYPREXA) 5 MG tablet Take 1 tablet (5 mg total) by mouth at bedtime. 30 tablet 3  . ondansetron (ZOFRAN) 8 MG tablet Take 1 tablet (8 mg total) by mouth every 8 (eight) hours as needed for nausea or vomiting. 60 tablet 2  . pantoprazole (PROTONIX) 40 MG tablet Take 1 tablet (40 mg total) by mouth daily. 30 tablet 6  . pilocarpine (SALAGEN) 5 MG tablet Take 1 tablet (5 mg total) by mouth 2 (two) times daily. 60 tablet 3  . prochlorperazine (COMPAZINE) 10 MG tablet Take 1 tablet (10 mg total) by mouth every 6 (six) hours as needed for nausea or vomiting. 60 tablet 2  . promethazine (PHENERGAN) 25 MG suppository Place 1 suppository (25 mg total) rectally every 6 (six) hours as needed for nausea or vomiting. 60 each 3  . pyridoxine (B-6) 250 MG tablet Take 1 tablet (250 mg total) by mouth daily. 90 tablet 4  . traMADol (ULTRAM) 50 MG tablet TAKE 1 TO 2 TABLETS BY MOUTH EVERY 6 HOURS AS NEEDED FOR PAIN.  0  . traZODone (DESYREL) 100 MG tablet TAKE 1 TO 2 TABLETS BY MOUTH AS NEEDED FOR SLEEP 30 tablet 2   No current facility-administered medications for this encounter.    Physical Findings: The patient is in no acute distress. Patient is alert and oriented.  height is 5\' 7"  (1.702 m) and weight is 172 lb 12.8  oz (78.382 kg). Her temperature is 97.7 F (36.5 C). Her blood pressure is 125/71 and her pulse is 90. Her oxygen saturation is 99%. .    Nasal cannula in place with 2L of oxygen. HEENT : EOMI. Oropharnx is clear with no thrush.  NEURO: Rapidly alternating movements and finger to nose testing intact.  MSK: Muscular strength is intact and symmetric. PSYCH: judgment / insight intact  Lab Findings: Lab Results  Component Value Date   WBC 4.8 08/13/2015   HGB 12.5 08/13/2015   HCT 38.2 08/13/2015   MCV 90  08/13/2015   PLT 98* 08/13/2015    Radiographic Findings: Mr [name] [facility] Contrast  08/17/2015  CLINICAL DATA:  49 year old female Brain metastasis, Grade III triple positive breast cancer. Status post SRS to Left temporal 22mm tumor / 20 Gy in 1 fraction. Subsequent encounter. EXAM: MRI HEAD WITHOUT AND WITH CONTRAST TECHNIQUE: Multiplanar, multiecho pulse sequences of the brain and surrounding structures were obtained without and with intravenous contrast. CONTRAST:  58mL MULTIHANCE GADOBENATE DIMEGLUMINE 529 MG/ML IV SOLN COMPARISON:  Brain MRI 04/30/2015 and earlier. FINDINGS: Stable 1-2 mm residual focus of enhancement in the posterior left temporal lobe on series 10, image 63. No other abnormal enhancement of the brain. No midline shift, mass effect, or evidence of intracranial mass lesion. No restricted diffusion to suggest acute infarction. No ventriculomegaly, extra-axial collection or acute intracranial hemorrhage. Cervicomedullary junction and pituitary are within normal limits. Negative visualized cervical spinal cord. Major intracranial vascular flow voids are stable. No new signal abnormality. Bone marrow signal is stable and within normal limits. Orbit and scalp soft tissues are stable. Chronic postoperative changes to the paranasal sinuses. Improved left frontal and ethmoid aeration interval increased fluid or mucosal thickening in the right frontal sinus. Mastoids remain clear.  Visible internal auditory structures appear normal. IMPRESSION: Stable and satisfactory post treatment appearance of the brain with punctate residual posterior left temporal lobe lesion. No new brain metastasis. Electronically Signed   By: Genevie Ann M.D.   On: 08/17/2015 13:28    Impression/Plan: Doing well from a CNS standpoint.  F/u in 3 months with Dr Sherwood Gambler after repeat brain MRI, sooner if needed. I advised the patient to speak with her orthopedic regarding physical therapy for pain management of her right shoulder. She will follow with Dr [name] re: pulmonary issues.  This document serves as a record of services personally performed by Eppie Gibson, MD. It was created on her behalf by Darcus Austin, a trained medical scribe. The creation of this record is based on the scribe's personal observations and the provider's statements to them. This document has been checked and approved by the attending provider.    _____________________________________   Eppie Gibson, MD

## 2015-08-25 ENCOUNTER — Telehealth: Payer: Self-pay | Admitting: Hematology & Oncology

## 2015-08-25 NOTE — Telephone Encounter (Signed)
Faxed last medical records update for case mgmt review:  To: Milana Kidney, RN CM Fx: [phone] Ph: [phone] ext [phone]    Case Id: [id number]  Last ofc note   COPY SCANNED

## 2015-09-01 ENCOUNTER — Ambulatory Visit (HOSPITAL_BASED_OUTPATIENT_CLINIC_OR_DEPARTMENT_OTHER): Payer: 59

## 2015-09-01 ENCOUNTER — Other Ambulatory Visit (HOSPITAL_BASED_OUTPATIENT_CLINIC_OR_DEPARTMENT_OTHER): Payer: 59

## 2015-09-01 ENCOUNTER — Ambulatory Visit (HOSPITAL_BASED_OUTPATIENT_CLINIC_OR_DEPARTMENT_OTHER): Payer: 59 | Admitting: Hematology & Oncology

## 2015-09-01 ENCOUNTER — Encounter: Payer: Self-pay | Admitting: Hematology & Oncology

## 2015-09-01 VITALS — BP 111/79 | HR 85 | Temp 97.3°F | Resp 16 | Ht 67.0 in | Wt 171.0 lb

## 2015-09-01 DIAGNOSIS — C50911 Malignant neoplasm of unspecified site of right female breast: Secondary | ICD-10-CM

## 2015-09-01 DIAGNOSIS — C50919 Malignant neoplasm of unspecified site of unspecified female breast: Secondary | ICD-10-CM

## 2015-09-01 DIAGNOSIS — R1110 Vomiting, unspecified: Secondary | ICD-10-CM

## 2015-09-01 DIAGNOSIS — Z170 Estrogen receptor positive status [ER+]: Secondary | ICD-10-CM

## 2015-09-01 DIAGNOSIS — G4700 Insomnia, unspecified: Secondary | ICD-10-CM

## 2015-09-01 DIAGNOSIS — Z5112 Encounter for antineoplastic immunotherapy: Secondary | ICD-10-CM

## 2015-09-01 DIAGNOSIS — R5383 Other fatigue: Secondary | ICD-10-CM

## 2015-09-01 DIAGNOSIS — C7951 Secondary malignant neoplasm of bone: Secondary | ICD-10-CM

## 2015-09-01 DIAGNOSIS — J4530 Mild persistent asthma, uncomplicated: Secondary | ICD-10-CM

## 2015-09-01 DIAGNOSIS — C50912 Malignant neoplasm of unspecified site of left female breast: Secondary | ICD-10-CM

## 2015-09-01 DIAGNOSIS — R64 Cachexia: Secondary | ICD-10-CM

## 2015-09-01 DIAGNOSIS — D500 Iron deficiency anemia secondary to blood loss (chronic): Secondary | ICD-10-CM

## 2015-09-01 DIAGNOSIS — G4701 Insomnia due to medical condition: Secondary | ICD-10-CM

## 2015-09-01 DIAGNOSIS — R0789 Other chest pain: Secondary | ICD-10-CM

## 2015-09-01 DIAGNOSIS — D510 Vitamin B12 deficiency anemia due to intrinsic factor deficiency: Secondary | ICD-10-CM

## 2015-09-01 DIAGNOSIS — M792 Neuralgia and neuritis, unspecified: Secondary | ICD-10-CM

## 2015-09-01 DIAGNOSIS — R112 Nausea with vomiting, unspecified: Secondary | ICD-10-CM

## 2015-09-01 LAB — CBC WITH DIFFERENTIAL (CANCER CENTER ONLY)
BASO#: 0 10*3/uL (ref 0.0–0.2)
BASO%: 0.3 % (ref 0.0–2.0)
EOS%: 1.7 % (ref 0.0–7.0)
Eosinophils Absolute: 0.1 10*3/uL (ref 0.0–0.5)
HEMATOCRIT: 41.5 % (ref 34.8–46.6)
HEMOGLOBIN: 13.9 g/dL (ref 11.6–15.9)
LYMPH#: 1.2 10*3/uL (ref 0.9–3.3)
LYMPH%: 19.9 % (ref 14.0–48.0)
MCH: 30.1 pg (ref 26.0–34.0)
MCHC: 33.5 g/dL (ref 32.0–36.0)
MCV: 90 fL (ref 81–101)
MONO#: 0.5 10*3/uL (ref 0.1–0.9)
MONO%: 8.9 % (ref 0.0–13.0)
NEUT%: 69.2 % (ref 39.6–80.0)
NEUTROS ABS: 4 10*3/uL (ref 1.5–6.5)
Platelets: 134 10*3/uL — ABNORMAL LOW (ref 145–400)
RBC: 4.62 10*6/uL (ref 3.70–5.32)
RDW: 17.2 % — AB (ref 11.1–15.7)
WBC: 5.8 10*3/uL (ref 3.9–10.0)

## 2015-09-01 LAB — CMP (CANCER CENTER ONLY)
ALK PHOS: 154 U/L — AB (ref 26–84)
ALT: 51 U/L — AB (ref 10–47)
AST: 48 U/L — ABNORMAL HIGH (ref 11–38)
Albumin: 3.3 g/dL (ref 3.3–5.5)
BILIRUBIN TOTAL: 0.7 mg/dL (ref 0.20–1.60)
BUN: 17 mg/dL (ref 7–22)
CO2: 24 mEq/L (ref 18–33)
CREATININE: 0.5 mg/dL — AB (ref 0.6–1.2)
Calcium: 8.6 mg/dL (ref 8.0–10.3)
Chloride: 106 mEq/L (ref 98–108)
Glucose, Bld: 89 mg/dL (ref 73–118)
Potassium: 3.9 mEq/L (ref 3.3–4.7)
SODIUM: 142 meq/L (ref 128–145)
TOTAL PROTEIN: 7.3 g/dL (ref 6.4–8.1)

## 2015-09-01 LAB — LACTATE DEHYDROGENASE (CC13): LDH: 170 U/L (ref 125–245)

## 2015-09-01 MED ORDER — DIPHENHYDRAMINE HCL 25 MG PO CAPS
50.0000 mg | ORAL_CAPSULE | Freq: Once | ORAL | Status: AC
  Administered 2015-09-01: 50 mg via ORAL

## 2015-09-01 MED ORDER — SODIUM CHLORIDE 0.9 % IV SOLN
420.0000 mg | Freq: Once | INTRAVENOUS | Status: AC
  Administered 2015-09-01: 420 mg via INTRAVENOUS
  Filled 2015-09-01: qty 14

## 2015-09-01 MED ORDER — SODIUM CHLORIDE 0.9 % IJ SOLN
10.0000 mL | INTRAMUSCULAR | Status: DC | PRN
  Administered 2015-09-01: 10 mL
  Filled 2015-09-01: qty 10

## 2015-09-01 MED ORDER — ACETAMINOPHEN 325 MG PO TABS
650.0000 mg | ORAL_TABLET | Freq: Once | ORAL | Status: AC
  Administered 2015-09-01: 650 mg via ORAL

## 2015-09-01 MED ORDER — ZOLEDRONIC ACID 4 MG/100ML IV SOLN
4.0000 mg | Freq: Once | INTRAVENOUS | Status: AC
  Administered 2015-09-01: 4 mg via INTRAVENOUS
  Filled 2015-09-01: qty 100

## 2015-09-01 MED ORDER — METHYLPHENIDATE HCL 10 MG PO TABS: 10.0000 mg | ORAL_TABLET | Freq: Two times a day (BID) | ORAL | Status: DC

## 2015-09-01 MED ORDER — ACETAMINOPHEN 325 MG PO TABS
ORAL_TABLET | ORAL | Status: AC
  Filled 2015-09-01: qty 2

## 2015-09-01 MED ORDER — TRASTUZUMAB CHEMO INJECTION 440 MG
6.0000 mg/kg | Freq: Once | INTRAVENOUS | Status: AC
  Administered 2015-09-01: 462 mg via INTRAVENOUS
  Filled 2015-09-01: qty 22

## 2015-09-01 MED ORDER — DIPHENHYDRAMINE HCL 25 MG PO CAPS
ORAL_CAPSULE | ORAL | Status: AC
  Filled 2015-09-01: qty 2

## 2015-09-01 MED ORDER — LIDOCAINE 5 % EX PTCH: 1.0000 | MEDICATED_PATCH | CUTANEOUS | Status: DC

## 2015-09-01 MED ORDER — SODIUM CHLORIDE 0.9 % IV SOLN
Freq: Once | INTRAVENOUS | Status: AC
Start: 2015-09-01 — End: 2015-09-01
  Administered 2015-09-01: 10:00:00 via INTRAVENOUS

## 2015-09-01 MED ORDER — HEPARIN SOD (PORK) LOCK FLUSH 100 UNIT/ML IV SOLN
500.0000 [IU] | Freq: Once | INTRAVENOUS | Status: AC | PRN
  Administered 2015-09-01: 500 [IU]
  Filled 2015-09-01: qty 5

## 2015-09-01 NOTE — Progress Notes (Signed)
Hematology and Oncology Follow Up Visit  Bianca Kent [id number] 1966/05/17 49 y.o. 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  Bianca Kent is back for followup . She looks pretty good. She still wearing oxygen.  She tolerated the last go-round of Xeloda pretty well. She is due to start her 4th cycle today.  She's had no issues with her hands or feet. There's been no skin peeling. She's had no erythema.  She is having some type of neuropathic type pain with her right chest wall area. This is the upper portion. I'm sure this is from where she had radiation. I thought maybe a Lidoderm patch to help.  She had an MRI the brain a week or so ago. This did not show any new brain lesions.  Her appetite has been doing okay. The Marinol really helps with this.  The Ritalin also has helped her. She feels more energetic. She has more focus when she concentrates.  Phenergan suppositories seem to help the most with nausea.  Her last CA 27.29 was 21 in July. I am rechecking it today.  She had a echocardiogram done in September. She had an excellent ejection fraction of 55-60%.  Her last PET scan was done [place]. I think we will have to repeat this.  Overall, her performance status is ECOG 1. Her last CA 2729 was 21 back in July.  Medications:  Current outpatient prescriptions:  .  albuterol (PROVENTIL HFA;VENTOLIN HFA) 108 (90 BASE) MCG/ACT inhaler, Inhale 2 puffs into the lungs every 4 (four) hours as needed for wheezing or shortness of breath., Disp: 2 Inhaler, Rfl: 6 .  ALPRAZolam (XANAX) 1 MG tablet, Take 1 tablet (1 mg total) by mouth at bedtime as needed for  anxiety., Disp: 30 tablet, Rfl: 2 .  aspirin (ASPIRIN EC) 81 MG EC tablet, Take 81 mg by mouth daily. Swallow whole., Disp: , Rfl:  .  Bismuth Subsalicylate (PEPTO-BISMOL PO), Take 1 tablet by mouth as needed (upset stomach). , Disp: , Rfl:  .  calcium carbonate (TUMS - DOSED IN MG ELEMENTAL CALCIUM) 500 MG chewable tablet, Chew 2 tablets by mouth 2 (two) times daily., Disp: , Rfl:  .  capecitabine (XELODA) 500 MG tablet, Take 4 tablets with food twice a day for 14 days, Disp: 112 tablet, Rfl: 3 .  diphenhydrAMINE (SOMINEX) 25 MG tablet, Take 100 mg by mouth at bedtime as needed for sleep. Pt states she is taking up to 200 mg Benadryl at night and still not sleeping, Disp: , Rfl:  .  docusate sodium (COLACE) 100 MG capsule, Take 1 capsule (100 mg total) by mouth 2 (two) times daily as needed for mild constipation or moderate constipation., Disp: 10 capsule, Rfl: 0 .  dronabinol (MARINOL) 10 MG capsule, Take 1 capsule (10 mg total) by mouth 3 (three) times daily before meals., Disp: 90 capsule, Rfl: 0 .  ergocalciferol (VITAMIN D2) 50000 UNITS capsule, Take 1 capsule (50,000 Units total) by mouth once a week., Disp: 4 capsule, Rfl: 6 .  fentaNYL (DURAGESIC - DOSED MCG/HR) 50 MCG/HR, Place 1 patch (50 mcg total) onto the skin  every other day., Disp: 15 patch, Rfl: 0 .  HYDROmorphone (DILAUDID) 4 MG tablet, Take 1 tablet (4 mg total) by mouth every 4 (four) hours as needed for severe pain., Disp: 90 tablet, Rfl: 0 .  ibuprofen (ADVIL,MOTRIN) 600 MG tablet, Take 1 tablet (600 mg total) by mouth every 8 (eight) hours as needed for moderate pain., Disp: 30 tablet, Rfl: 0 .  Lactulose SOLN, Take 2 TBLSP every 6 hours until bowel movement, Disp: 1000 mL, Rfl: 4 .  lidocaine (LIDODERM) 5 %, Place 1 patch onto the skin daily. Remove & Discard patch within 12 hours or as directed by MD, Disp: 20 patch, Rfl: 0 .  lidocaine (XYLOCAINE) 2 % solution, Use as directed 20 mLs in the mouth or throat as needed for mouth  pain., Disp: 200 mL, Rfl: 2 .  lidocaine-prilocaine (EMLA) cream, Apply 1 application topically as needed., Disp: 30 g, Rfl: 6 .  loratadine-pseudoephedrine (CLARITIN-D 24-HOUR) 10-240 MG per 24 hr tablet, Take 1 tablet by mouth daily., Disp: , Rfl:  .  methylphenidate (RITALIN) 10 MG tablet, Take 1 tablet (10 mg total) by mouth 2 (two) times daily., Disp: 60 tablet, Rfl: 0 .  metoCLOPramide (REGLAN) 10 MG tablet, Take 1 tablet (10 mg total) by mouth every 6 (six) hours as needed for nausea., Disp: 120 tablet, Rfl: 5 .  Multiple Vitamin (MULTIVITAMIN) tablet, Take 1 tablet by mouth daily., Disp: , Rfl:  .  naloxegol oxalate (MOVANTIK) 25 MG TABS tablet, Take 1 tablet (25 mg total) by mouth daily., Disp: 30 tablet, Rfl: 6 .  NONFORMULARY OR COMPOUNDED ITEM, Take 5 mLs by mouth as needed (irritation). DUKES MOUTHWASH  SWISH AND SWALLOW, Disp: , Rfl:  .  OLANZapine (ZYPREXA) 5 MG tablet, Take 1 tablet (5 mg total) by mouth at bedtime., Disp: 30 tablet, Rfl: 3 .  ondansetron (ZOFRAN) 8 MG tablet, Take 1 tablet (8 mg total) by mouth every 8 (eight) hours as needed for nausea or vomiting., Disp: 60 tablet, Rfl: 2 .  pantoprazole (PROTONIX) 40 MG tablet, Take 1 tablet (40 mg total) by mouth daily., Disp: 30 tablet, Rfl: 6 .  pilocarpine (SALAGEN) 5 MG tablet, Take 1 tablet (5 mg total) by mouth 2 (two) times daily., Disp: 60 tablet, Rfl: 3 .  prochlorperazine (COMPAZINE) 10 MG tablet, Take 1 tablet (10 mg total) by mouth every 6 (six) hours as needed for nausea or vomiting., Disp: 60 tablet, Rfl: 2 .  promethazine (PHENERGAN) 25 MG suppository, Place 1 suppository (25 mg total) rectally every 6 (six) hours as needed for nausea or vomiting., Disp: 60 each, Rfl: 3 .  pyridoxine (B-6) 250 MG tablet, Take 1 tablet (250 mg total) by mouth daily., Disp: 90 tablet, Rfl: 4 .  traMADol (ULTRAM) 50 MG tablet, TAKE 1 TO 2 TABLETS BY MOUTH EVERY 6 HOURS AS NEEDED FOR PAIN., Disp: , Rfl: 0 .  traZODone (DESYREL) 100 MG  tablet, TAKE 1 TO 2 TABLETS BY MOUTH AS NEEDED FOR SLEEP, Disp: 30 tablet, Rfl: 2 No current facility-administered medications for this visit.  Facility-Administered Medications Ordered in Other Visits:  .  heparin lock flush 100 unit/mL, 500 Units, Intracatheter, Once PRN, [name], MD .  pertuzumab (PERJETA) 420 mg in sodium chloride 0.9 % 250 mL chemo infusion, 420 mg, Intravenous, Once, [name], MD, Last Rate: 528 mL/hr at 09/01/15 1141, 420 mg at 09/01/15 1141 .  sodium chloride 0.9 % injection 10 mL, 10 mL, Intracatheter, PRN, Collier Salina  Oletha Cruel, MD .  Zoledronic Acid (ZOMETA) 4 mg IVPB, 4 mg, Intravenous, Once, [name], MD  Allergies:  Allergies  Allergen Reactions  . Iohexol      Code: RASH, Desc: VERY STRONG FAMILY HX OF ANGIOEDEMA WHEN RECEIVING IV CONTRAST; PT HAS BEEN PREMEDICATED FOR OTHER CONTRASTED STUDIES(IN CATH. LAB)  KR, Onset Date: [id number]   . Prednisone Itching    Capillary beds bust  . Tetanus Toxoids Other (See Comments)    Ran a high fever for 48 hours  . Theophyllines Hives    Mental changes  . Versed [Midazolam] Other (See Comments)    Pt becomes violent    Past Medical History, Surgical history, Social history, and Family History were reviewed and updated.  Review of Systems: As above  Physical Exam:  height is '5\' 7"'  (1.702 m) and weight is 171 lb (77.565 kg). Her oral temperature is 97.3 F (36.3 C). Her blood pressure is 111/79 and her pulse is 85. Her respiration is 16.   Well-developed and well-nourished white female. Head and exam shows no ocular or oral lesions. She's able to move both eyes well. She has good extraocular muscle . There is no erythema or exudate from the left eye. There might be some slight fullness in the right axilla by do not detect any obvious adenopathy. I cannot detect any obvious right supraclavicular lymph nodes. She has improved range of motion of the right shoulder. Left supraclavicular region is okay.  No masses are noted. Lungs are clear. Cardiac exam regular rate and rhythm with no murmurs, rubs or bruits.. Abdomen is soft. She has good bowel sounds. There is no palpable liver or spleen tip. Back exam shows decreased tenderness over the lumbar and thoracic spine to palpation. Extremities shows no clubbing, cyanosis or edema. She has 4/5 strength in her legs. Skin exam shows improvement in the radiation dermatitis of the right breast and axilla. There is no open wound. There is some hyper pigmentation. Neurological exam is nonfocal.    Lab Results  Component Value Date   WBC 5.8 09/01/2015   HGB 13.9 09/01/2015   HCT 41.5 09/01/2015   MCV 90 09/01/2015   PLT 134* 09/01/2015     Chemistry      Component Value Date/Time   NA 142 09/01/2015 0851   NA 138 04/30/2015 1019   NA 138 03/19/2015 0312   K 3.9 09/01/2015 0851   K 3.5 04/30/2015 1019   K 3.8 03/19/2015 0312   CL 106 09/01/2015 0851   CL 101 03/19/2015 0312   CO2 24 09/01/2015 0851   CO2 29 04/30/2015 1019   CO2 29 03/19/2015 0312   BUN 17 09/01/2015 0851   BUN 9.7 04/30/2015 1019   BUN 7 03/19/2015 0312   CREATININE 0.5* 09/01/2015 0851   CREATININE 0.8 04/30/2015 1019   CREATININE 0.75 03/19/2015 0312      Component Value Date/Time   CALCIUM 8.6 09/01/2015 0851   CALCIUM 9.7 04/30/2015 1019   CALCIUM 8.7* 03/19/2015 0312   ALKPHOS 154* 09/01/2015 0851   ALKPHOS 257* 04/30/2015 1019   ALKPHOS 238* 03/06/2015 0425   AST 48* 09/01/2015 0851   AST 57* 04/30/2015 1019   AST 41 03/06/2015 0425   ALT 51* 09/01/2015 0851   ALT 41 04/30/2015 1019   ALT 38 03/06/2015 0425   BILITOT 0.70 09/01/2015 0851   BILITOT 0.60 04/30/2015 1019   BILITOT 0.4 03/06/2015 0425  Impression and Plan: Ms. Leth is 49 year old white female with triple positive metastatic breast cancer.  We will go ahead and give her the Herceptin and Perjeta. I think this really is helping her.  I suppose we always could consider trying  her on Tykerb. This is an oral HER-2 inhibitor.  She is due for her scans the week after Thanksgiving.  We will plan to see her back right after the scans are done.  She will be going to [place] for Thanksgiving.  As always, we had an excellent prayer session.   [name], MD 11/9/201611:56 AM

## 2015-09-01 NOTE — Patient Instructions (Signed)
[facility] Discharge Instructions for Patients Receiving Chemotherapy  Today you received the following chemotherapy agents Herceptin, Perjeta.  To help prevent nausea and vomiting after your treatment, we encourage you to take your nausea medication.   If you develop nausea and vomiting that is not controlled by your nausea medication, call the clinic.   BELOW ARE SYMPTOMS THAT SHOULD BE REPORTED IMMEDIATELY:  *FEVER GREATER THAN 100.5 F  *CHILLS WITH OR WITHOUT FEVER  NAUSEA AND VOMITING THAT IS NOT CONTROLLED WITH YOUR NAUSEA MEDICATION  *UNUSUAL SHORTNESS OF BREATH  *UNUSUAL BRUISING OR BLEEDING  TENDERNESS IN MOUTH AND THROAT WITH OR WITHOUT PRESENCE OF ULCERS  *URINARY PROBLEMS  *BOWEL PROBLEMS  UNUSUAL RASH Items with * indicate a potential emergency and should be followed up as soon as possible.  Feel free to call the clinic you have any questions or concerns. The clinic phone number is [phone].  Please show the CHEMO ALERT CARD at check-in to the Emergency Department and triage nurse.   

## 2015-09-02 ENCOUNTER — Telehealth: Payer: Self-pay | Admitting: *Deleted

## 2015-09-02 DIAGNOSIS — C50911 Malignant neoplasm of unspecified site of right female breast: Secondary | ICD-10-CM

## 2015-09-02 LAB — CANCER ANTIGEN 27.29: CA 27.29: 27 U/mL (ref 0–39)

## 2015-09-02 LAB — VITAMIN D 25 HYDROXY (VIT D DEFICIENCY, FRACTURES): Vit D, 25-Hydroxy: 14 ng/mL — ABNORMAL LOW (ref 30–100)

## 2015-09-02 MED ORDER — ERGOCALCIFEROL 1.25 MG (50000 UT) PO CAPS: 50000.0000 [IU] | ORAL_CAPSULE | ORAL | Status: DC

## 2015-09-02 NOTE — Telephone Encounter (Addendum)
Patient aware of results. Prescription sent in.   ----- Message from [name], MD sent at 09/02/2015 10:14 AM EST ----- Call - vit d is very low needs 50000 units twice a week.  Please call in!!  pete

## 2015-09-22 ENCOUNTER — Ambulatory Visit (HOSPITAL_COMMUNITY)
Admission: RE | Admit: 2015-09-22 | Discharge: 2015-09-22 | Disposition: A | Discharge status: Home / Self Care | Payer: 59 | Source: Ambulatory Visit | Attending: Hematology & Oncology | Admitting: Hematology & Oncology

## 2015-09-22 DIAGNOSIS — Z923 Personal history of irradiation: Secondary | ICD-10-CM | POA: Diagnosis not present

## 2015-09-22 DIAGNOSIS — Z0189 Encounter for other specified special examinations: Secondary | ICD-10-CM | POA: Diagnosis present

## 2015-09-22 DIAGNOSIS — C7951 Secondary malignant neoplasm of bone: Secondary | ICD-10-CM | POA: Diagnosis not present

## 2015-09-22 DIAGNOSIS — C50911 Malignant neoplasm of unspecified site of right female breast: Secondary | ICD-10-CM | POA: Diagnosis present

## 2015-09-22 DIAGNOSIS — C50912 Malignant neoplasm of unspecified site of left female breast: Secondary | ICD-10-CM | POA: Insufficient documentation

## 2015-09-22 DIAGNOSIS — R64 Cachexia: Secondary | ICD-10-CM

## 2015-09-22 DIAGNOSIS — R5383 Other fatigue: Secondary | ICD-10-CM | POA: Diagnosis not present

## 2015-09-22 DIAGNOSIS — K769 Liver disease, unspecified: Secondary | ICD-10-CM | POA: Diagnosis not present

## 2015-09-22 DIAGNOSIS — R112 Nausea with vomiting, unspecified: Secondary | ICD-10-CM | POA: Insufficient documentation

## 2015-09-22 DIAGNOSIS — G4701 Insomnia due to medical condition: Secondary | ICD-10-CM | POA: Insufficient documentation

## 2015-09-22 LAB — GLUCOSE, CAPILLARY: Glucose-Capillary: 105 mg/dL — ABNORMAL HIGH (ref 65–99)

## 2015-09-22 MED ORDER — FLUDEOXYGLUCOSE F - 18 (FDG) INJECTION
9.8500 | Freq: Once | INTRAVENOUS | Status: AC | PRN
  Administered 2015-09-22: 9.85 via INTRAVENOUS

## 2015-09-23 ENCOUNTER — Telehealth: Payer: Self-pay

## 2015-09-23 NOTE — Telephone Encounter (Addendum)
-----   Message from [name], MD sent at 09/22/2015  5:53 PM EST -----  Called patient regarding her result per Dr. [name]. There is still no obvious growth of the cancer!!!  Merry Christmas!!

## 2015-09-29 ENCOUNTER — Other Ambulatory Visit (HOSPITAL_BASED_OUTPATIENT_CLINIC_OR_DEPARTMENT_OTHER): Payer: 59

## 2015-09-29 ENCOUNTER — Ambulatory Visit (HOSPITAL_BASED_OUTPATIENT_CLINIC_OR_DEPARTMENT_OTHER): Payer: 59

## 2015-09-29 ENCOUNTER — Encounter: Payer: Self-pay | Admitting: Hematology & Oncology

## 2015-09-29 ENCOUNTER — Telehealth: Payer: Self-pay | Admitting: Hematology & Oncology

## 2015-09-29 ENCOUNTER — Ambulatory Visit (HOSPITAL_BASED_OUTPATIENT_CLINIC_OR_DEPARTMENT_OTHER): Payer: 59 | Admitting: Hematology & Oncology

## 2015-09-29 ENCOUNTER — Other Ambulatory Visit: Payer: Self-pay | Admitting: *Deleted

## 2015-09-29 VITALS — BP 101/66 | HR 86 | Temp 97.4°F | Resp 16 | Ht 67.0 in | Wt 177.0 lb

## 2015-09-29 DIAGNOSIS — Z5112 Encounter for antineoplastic immunotherapy: Secondary | ICD-10-CM

## 2015-09-29 DIAGNOSIS — G4701 Insomnia due to medical condition: Secondary | ICD-10-CM

## 2015-09-29 DIAGNOSIS — R64 Cachexia: Secondary | ICD-10-CM

## 2015-09-29 DIAGNOSIS — C7931 Secondary malignant neoplasm of brain: Secondary | ICD-10-CM

## 2015-09-29 DIAGNOSIS — M899 Disorder of bone, unspecified: Secondary | ICD-10-CM

## 2015-09-29 DIAGNOSIS — G4700 Insomnia, unspecified: Secondary | ICD-10-CM

## 2015-09-29 DIAGNOSIS — R5383 Other fatigue: Secondary | ICD-10-CM

## 2015-09-29 DIAGNOSIS — C50911 Malignant neoplasm of unspecified site of right female breast: Secondary | ICD-10-CM

## 2015-09-29 DIAGNOSIS — M792 Neuralgia and neuritis, unspecified: Secondary | ICD-10-CM

## 2015-09-29 DIAGNOSIS — C50919 Malignant neoplasm of unspecified site of unspecified female breast: Secondary | ICD-10-CM

## 2015-09-29 DIAGNOSIS — C50912 Malignant neoplasm of unspecified site of left female breast: Secondary | ICD-10-CM

## 2015-09-29 DIAGNOSIS — J4530 Mild persistent asthma, uncomplicated: Secondary | ICD-10-CM

## 2015-09-29 DIAGNOSIS — R112 Nausea with vomiting, unspecified: Secondary | ICD-10-CM

## 2015-09-29 LAB — CBC WITH DIFFERENTIAL (CANCER CENTER ONLY)
BASO#: 0 10*3/uL (ref 0.0–0.2)
BASO%: 0.2 % (ref 0.0–2.0)
EOS%: 1.6 % (ref 0.0–7.0)
Eosinophils Absolute: 0.1 10*3/uL (ref 0.0–0.5)
HEMATOCRIT: 39 % (ref 34.8–46.6)
HGB: 13 g/dL (ref 11.6–15.9)
LYMPH#: 0.7 10*3/uL — AB (ref 0.9–3.3)
LYMPH%: 16.5 % (ref 14.0–48.0)
MCH: 30.2 pg (ref 26.0–34.0)
MCHC: 33.3 g/dL (ref 32.0–36.0)
MCV: 91 fL (ref 81–101)
MONO#: 0.5 10*3/uL (ref 0.1–0.9)
MONO%: 10.8 % (ref 0.0–13.0)
NEUT#: 3 10*3/uL (ref 1.5–6.5)
NEUT%: 70.9 % (ref 39.6–80.0)
Platelets: 98 10*3/uL — ABNORMAL LOW (ref 145–400)
RBC: 4.3 10*6/uL (ref 3.70–5.32)
RDW: 15.9 % — AB (ref 11.1–15.7)
WBC: 4.3 10*3/uL (ref 3.9–10.0)

## 2015-09-29 LAB — CANCER ANTIGEN 27.29: CA 27.29: 24 U/mL (ref 0–39)

## 2015-09-29 MED ORDER — DIPHENHYDRAMINE HCL 25 MG PO CAPS
ORAL_CAPSULE | ORAL | Status: AC
  Filled 2015-09-29: qty 2

## 2015-09-29 MED ORDER — DIPHENHYDRAMINE HCL 25 MG PO CAPS
50.0000 mg | ORAL_CAPSULE | Freq: Once | ORAL | Status: AC
  Administered 2015-09-29: 50 mg via ORAL

## 2015-09-29 MED ORDER — METOCLOPRAMIDE HCL 10 MG PO TABS: 10.0000 mg | ORAL_TABLET | Freq: Four times a day (QID) | ORAL | Status: DC | PRN

## 2015-09-29 MED ORDER — TRASTUZUMAB CHEMO INJECTION 440 MG
462.0000 mg | Freq: Once | INTRAVENOUS | Status: AC
  Administered 2015-09-29: 462 mg via INTRAVENOUS
  Filled 2015-09-29: qty 22

## 2015-09-29 MED ORDER — SODIUM CHLORIDE 0.9 % IV SOLN
420.0000 mg | Freq: Once | INTRAVENOUS | Status: AC
  Administered 2015-09-29: 420 mg via INTRAVENOUS
  Filled 2015-09-29: qty 14

## 2015-09-29 MED ORDER — SODIUM CHLORIDE 0.9 % IJ SOLN
10.0000 mL | INTRAMUSCULAR | Status: DC | PRN
  Administered 2015-09-29: 10 mL
  Filled 2015-09-29: qty 10

## 2015-09-29 MED ORDER — ACETAMINOPHEN 325 MG PO TABS
650.0000 mg | ORAL_TABLET | Freq: Once | ORAL | Status: AC
  Administered 2015-09-29: 650 mg via ORAL

## 2015-09-29 MED ORDER — HEPARIN SOD (PORK) LOCK FLUSH 100 UNIT/ML IV SOLN
500.0000 [IU] | Freq: Once | INTRAVENOUS | Status: AC | PRN
  Administered 2015-09-29: 500 [IU]
  Filled 2015-09-29: qty 5

## 2015-09-29 MED ORDER — ACETAMINOPHEN 325 MG PO TABS
ORAL_TABLET | ORAL | Status: AC
  Filled 2015-09-29: qty 2

## 2015-09-29 MED ORDER — ZOLEDRONIC ACID 4 MG/100ML IV SOLN
4.0000 mg | Freq: Once | INTRAVENOUS | Status: AC
  Administered 2015-09-29: 4 mg via INTRAVENOUS
  Filled 2015-09-29: qty 100

## 2015-09-29 MED ORDER — SODIUM CHLORIDE 0.9 % IV SOLN
Freq: Once | INTRAVENOUS | Status: AC
  Administered 2015-09-29: 11:00:00 via INTRAVENOUS

## 2015-09-29 NOTE — Patient Instructions (Addendum)
[facility] Discharge Instructions for Patients Receiving Chemotherapy  Today you received the following chemotherapy agents Herceptin, Perjeta  To help prevent nausea and vomiting after your treatment, we encourage you to take your nausea medication    If you develop nausea and vomiting that is not controlled by your nausea medication, call the clinic.   BELOW ARE SYMPTOMS THAT SHOULD BE REPORTED IMMEDIATELY:  *FEVER GREATER THAN 100.5 F  *CHILLS WITH OR WITHOUT FEVER  NAUSEA AND VOMITING THAT IS NOT CONTROLLED WITH YOUR NAUSEA MEDICATION  *UNUSUAL SHORTNESS OF BREATH  *UNUSUAL BRUISING OR BLEEDING  TENDERNESS IN MOUTH AND THROAT WITH OR WITHOUT PRESENCE OF ULCERS  *URINARY PROBLEMS  *BOWEL PROBLEMS  UNUSUAL RASH Items with * indicate a potential emergency and should be followed up as soon as possible.  Feel free to call the clinic you have any questions or concerns. The clinic phone number is [phone].  Please show the [employer] at check-in to the Emergency Department and triage nurse.  Zoledronic Acid injection (Hypercalcemia, Oncology) What is this medicine? ZOLEDRONIC ACID (ZOE le dron ik AS id) lowers the amount of calcium loss from bone. It is used to treat too much calcium in your blood from cancer. It is also used to prevent complications of cancer that has spread to the bone. This medicine may be used for other purposes; ask your health care provider or pharmacist if you have questions. What should I tell my health care provider before I take this medicine? They need to know if you have any of these conditions: -aspirin-sensitive asthma -cancer, especially if you are receiving medicines used to treat cancer -dental disease or wear dentures -infection -kidney disease -receiving corticosteroids like dexamethasone or prednisone -an unusual or allergic reaction to zoledronic acid, other medicines, foods, dyes, or preservatives -pregnant  or trying to get pregnant -breast-feeding How should I use this medicine? This medicine is for infusion into a vein. It is given by a health care professional in a hospital or clinic setting. Talk to your pediatrician regarding the use of this medicine in children. Special care may be needed. Overdosage: If you think you have taken too much of this medicine contact a poison control center or emergency room at once. NOTE: This medicine is only for you. Do not share this medicine with others. What if I miss a dose? It is important not to miss your dose. Call your doctor or health care professional if you are unable to keep an appointment. What may interact with this medicine? -certain antibiotics given by injection -NSAIDs, medicines for pain and inflammation, like ibuprofen or naproxen -some diuretics like bumetanide, furosemide -teriparatide -thalidomide This list may not describe all possible interactions. Give your health care provider a list of all the medicines, herbs, non-prescription drugs, or dietary supplements you use. Also tell them if you smoke, drink alcohol, or use illegal drugs. Some items may interact with your medicine. What should I watch for while using this medicine? Visit your doctor or health care professional for regular checkups. It may be some time before you see the benefit from this medicine. Do not stop taking your medicine unless your doctor tells you to. Your doctor may order blood tests or other tests to see how you are doing. Women should inform their doctor if they wish to become pregnant or think they might be pregnant. There is a potential for serious side effects to an unborn child. Talk to your health care professional or  pharmacist for more information. You should make sure that you get enough calcium and vitamin D while you are taking this medicine. Discuss the foods you eat and the vitamins you take with your health care professional. Some people who take  this medicine have severe bone, joint, and/or muscle pain. This medicine may also increase your risk for jaw problems or a broken thigh bone. Tell your doctor right away if you have severe pain in your jaw, bones, joints, or muscles. Tell your doctor if you have any pain that does not go away or that gets worse. Tell your dentist and dental surgeon that you are taking this medicine. You should not have major dental surgery while on this medicine. See your dentist to have a dental exam and fix any dental problems before starting this medicine. Take good care of your teeth while on this medicine. Make sure you see your dentist for regular follow-up appointments. What side effects may I notice from receiving this medicine? Side effects that you should report to your doctor or health care professional as soon as possible: -allergic reactions like skin rash, itching or hives, swelling of the face, lips, or tongue -anxiety, confusion, or depression -breathing problems -changes in vision -eye pain -feeling faint or lightheaded, falls -jaw pain, especially after dental work -mouth sores -muscle cramps, stiffness, or weakness -redness, blistering, peeling or loosening of the skin, including inside the mouth -trouble passing urine or change in the amount of urine Side effects that usually do not require medical attention (report to your doctor or health care professional if they continue or are bothersome): -bone, joint, or muscle pain -constipation -diarrhea -fever -hair loss -irritation at site where injected -loss of appetite -nausea, vomiting -stomach upset -trouble sleeping -trouble swallowing -weak or tired This list may not describe all possible side effects. Call your doctor for medical advice about side effects. You may report side effects to FDA at 1-800-FDA-1088. Where should I keep my medicine? This drug is given in a hospital or clinic and will not be stored at home. NOTE: This  sheet is a summary. It may not cover all possible information. If you have questions about this medicine, talk to your doctor, pharmacist, or health care provider.    2016, [employer]vier/[employer] Standard. (2014-03-07 14:19:39)

## 2015-09-29 NOTE — Telephone Encounter (Signed)
[id number] Thomasene Lot, RN CM  Fx: [phone] Ph: [phone] ext [phone] APPROVED Please review patient below for CASE MGMT. CASE ID: [id number]  PATIENT:Bianca Kent DOB:[birth year] MRN: [id number]  [phone] [id number] GRP: [id number] DX: Breast cancer metastasized to multiple sites - c50.911, c79.9      Jcodes: J9354 Herceptin [id number] Perjeta [id number] ALOXI [id number] EMEND  Dos: 09/23/2015 - 03/23/2016

## 2015-09-29 NOTE — Progress Notes (Signed)
Hematology and Oncology Follow Up Visit  Bianca Kent [id number] 22-Jul-1966 49 y.o. 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 is back for followup . She looks pretty good. She still wearing oxygen.  She tolerated the last go-round of Xeloda pretty well.   The big news is that she will need surgery for her right shoulder. This is not anything malignant related. This is a rotator cuff problem. She has a large bone spur that is impinging the rotator cuff.  I don't see any problems with her having surgery.  She will finish up her latest cycle of Xeloda this week. I will then have her hold the Xeloda until after her surgery.  She is on Perjeta and Herceptin. At these are holding everything stable. These will not affect her immune function or her ability to heal.  We did do a PET scan on her. The PET scan did not show any obvious aggressive disease. The radiologist were somewhat ambiguous with their report blood there does not appear to be any obvious progression. She has a T6 lesion which appears to be stable. There is a questionable lesion in her liver.  Her CA-27-29 is holding pretty steady at 27.  The pain in her shoulder is the biggest problem that she has. Her pain medication really is not helping that. I will think anything will help this until she has her surgery.  There is some concern because she doesn't wear oxygen. 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 However, I will like to think that she should be able to get through surgery without any complications.   Her last echocardiogram was done back in mid September with a good left reticular  ejection fraction of 55-60%.  Overall, her performance status is ECOG 1. .  Medications:  Current outpatient prescriptions:  .  albuterol (PROVENTIL HFA;VENTOLIN HFA) 108 (90 BASE) MCG/ACT inhaler, Inhale 2 puffs into the lungs every 4 (four) hours as needed for wheezing or shortness of breath., Disp: 2 Inhaler, Rfl: 6 .  ALPRAZolam (XANAX) 1 MG tablet, Take 1 tablet (1 mg total) by mouth at bedtime as needed for anxiety., Disp: 30 tablet, Rfl: 2 .  aspirin (ASPIRIN EC) 81 MG EC tablet, Take 81 mg by mouth daily. Swallow whole., Disp: , Rfl:  .  Bismuth Subsalicylate (PEPTO-BISMOL PO), Take 1 tablet by mouth as needed (upset stomach). , Disp: , Rfl:  .  calcium carbonate (TUMS - DOSED IN MG ELEMENTAL CALCIUM) 500 MG chewable tablet, Chew 2 tablets by mouth 2 (two) times daily., Disp: , Rfl:  .  capecitabine (XELODA) 500 MG tablet, Take 4 tablets with food twice a day for 14 days, Disp: 112 tablet, Rfl: 3 .  diphenhydrAMINE (SOMINEX) 25 MG tablet, Take 100 mg by mouth at bedtime as needed for sleep. Pt states she is taking up to 200 mg Benadryl at night and still not sleeping, Disp: , Rfl:  .  docusate sodium (COLACE) 100 MG capsule, Take 1 capsule (100 mg total) by mouth 2 (two) times daily as needed for mild constipation or moderate constipation., Disp: 10 capsule, Rfl: 0 .  dronabinol (MARINOL) 10 MG capsule, Take 1 capsule (10 mg total) by mouth 3 (three) times daily before meals., Disp: 90 capsule, Rfl: 0 .  ergocalciferol (VITAMIN D2) 50000 UNITS capsule, Take 1 capsule (50,000 Units total) by mouth 2 (two) times a week., Disp: 8 capsule, Rfl: 6 .  fentaNYL (DURAGESIC - DOSED MCG/HR) 50 MCG/HR, Place 1 patch (50 mcg total) onto the skin every other day., Disp: 15 patch, Rfl: 0 .  HYDROmorphone (DILAUDID) 4 MG tablet, Take 1 tablet (4 mg total) by mouth every 4 (four) hours as needed for severe pain., Disp: 90 tablet, Rfl: 0 .  ibuprofen (ADVIL,MOTRIN) 600 MG tablet, Take 1 tablet (600 mg  total) by mouth every 8 (eight) hours as needed for moderate pain., Disp: 30 tablet, Rfl: 0 .  Lactulose SOLN, Take 2 TBLSP every 6 hours until bowel movement, Disp: 1000 mL, Rfl: 4 .  lidocaine (LIDODERM) 5 %, Place 1 patch onto the skin daily. Remove & Discard patch within 12 hours or as directed by MD, Disp: 20 patch, Rfl: 0 .  lidocaine (LIDODERM) 5 %, Place 1 patch onto the skin daily. Remove & Discard patch within 12 hours or as directed by MD, Disp: 30 patch, Rfl: 4 .  lidocaine (XYLOCAINE) 2 % solution, Use as directed 20 mLs in the mouth or throat as needed for mouth pain., Disp: 200 mL, Rfl: 2 .  lidocaine-prilocaine (EMLA) cream, Apply 1 application topically as needed., Disp: 30 g, Rfl: 6 .  loratadine-pseudoephedrine (CLARITIN-D 24-HOUR) 10-240 MG per 24 hr tablet, Take 1 tablet by mouth daily., Disp: , Rfl:  .  methylphenidate (RITALIN) 10 MG tablet, Take 1 tablet (10 mg total) by mouth 2 (two) times daily., Disp: 60 tablet, Rfl: 0 .  metoCLOPramide (REGLAN) 10 MG tablet, Take 1 tablet (10 mg total) by mouth every 6 (six) hours as needed for nausea., Disp: 120 tablet, Rfl: 5 .  Multiple Vitamin (MULTIVITAMIN) tablet, Take 1 tablet by mouth daily., Disp: , Rfl:  .  naloxegol oxalate (MOVANTIK) 25 MG TABS tablet, Take 1 tablet (25 mg total) by mouth daily., Disp: 30 tablet, Rfl: 6 .  NONFORMULARY OR COMPOUNDED ITEM, Take 5 mLs by mouth as needed (irritation). DUKES MOUTHWASH  SWISH AND SWALLOW, Disp: , Rfl:  .  OLANZapine (ZYPREXA) 5 MG tablet, Take 1 tablet (5 mg total) by mouth at bedtime., Disp: 30 tablet, Rfl: 3 .  ondansetron (ZOFRAN) 8 MG tablet, Take 1 tablet (8 mg total) by mouth every 8 (eight) hours as needed for nausea or vomiting., Disp: 60 tablet, Rfl: 2 .  pantoprazole (PROTONIX) 40 MG tablet, Take 1 tablet (40 mg total) by mouth daily., Disp: 30 tablet, Rfl: 6 .  pilocarpine (SALAGEN) 5 MG tablet, Take 1 tablet (5 mg total) by mouth 2 (two) times daily., Disp: 60 tablet,  Rfl: 3 .  prochlorperazine (COMPAZINE) 10 MG tablet, Take 1 tablet (10 mg total) by mouth every 6 (six) hours as needed for nausea or vomiting., Disp: 60 tablet, Rfl: 2 .  promethazine (PHENERGAN) 25 MG suppository, Place 1 suppository (25 mg total) rectally every 6 (six) hours as needed for nausea or vomiting., Disp: 60 each, Rfl: 3 .  pyridoxine (B-6) 250 MG tablet, Take 1 tablet (250 mg total) by mouth daily., Disp: 90 tablet, Rfl: 4 .  traMADol (ULTRAM) 50 MG tablet, TAKE 1 TO 2 TABLETS BY MOUTH EVERY 6 HOURS AS NEEDED FOR PAIN., Disp: , Rfl: 0 .  traZODone (DESYREL) 100  MG tablet, TAKE 1 TO 2 TABLETS BY MOUTH AS NEEDED FOR SLEEP, Disp: 30 tablet, Rfl: 2 No current facility-administered medications for this visit.  Facility-Administered Medications Ordered in Other Visits:  .  0.9 %  sodium chloride infusion, , Intravenous, Once, [name], MD .  acetaminophen (TYLENOL) tablet 650 mg, 650 mg, Oral, Once, [name], MD .  diphenhydrAMINE (BENADRYL) capsule 50 mg, 50 mg, Oral, Once, [name], MD .  heparin lock flush 100 unit/mL, 500 Units, Intracatheter, Once PRN, [name], MD .  pertuzumab (PERJETA) 420 mg in sodium chloride 0.9 % 250 mL chemo infusion, 420 mg, Intravenous, Once, [name], MD .  sodium chloride 0.9 % injection 10 mL, 10 mL, Intracatheter, PRN, [name], MD .  trastuzumab (HERCEPTIN) 420 mg in sodium chloride 0.9 % 250 mL chemo infusion, 6 mg/kg (Treatment Plan Actual), Intravenous, Once, [name], MD  Allergies:  Allergies  Allergen Reactions  . Iohexol      Code: RASH, Desc: VERY STRONG FAMILY HX OF ANGIOEDEMA WHEN RECEIVING IV CONTRAST; PT HAS BEEN PREMEDICATED FOR OTHER CONTRASTED STUDIES(IN CATH. LAB)  KR, Onset Date: [id number]   . Prednisone Itching    Capillary beds bust  . Tetanus Toxoids Other (See Comments)    Ran a high fever for 48 hours  . Theophyllines Hives    Mental changes  . Versed [Midazolam] Other (See  Comments)    Pt becomes violent    Past Medical History, Surgical history, Social history, and Family History were reviewed and updated.  Review of Systems: As above  Physical Exam:  height is 5\' 7"  (1.702 m) and weight is 177 lb (80.287 kg). Her oral temperature is 97.4 F (36.3 C). Her blood pressure is 101/66 and her pulse is 86. Her respiration is 16.   Well-developed and well-nourished white female. Head and exam shows no ocular or oral lesions. She's able to move both eyes well. She has good extraocular muscle . There is no erythema or exudate from the left eye. There might be some slight fullness in the right axilla by do not detect any obvious adenopathy. I cannot detect any obvious right supraclavicular lymph nodes. She has improved range of motion of the right shoulder. Left supraclavicular region is okay. No masses are noted. Lungs are clear. Cardiac exam regular rate and rhythm with no murmurs, rubs or bruits.. Abdomen is soft. She has good bowel sounds. There is no palpable liver or spleen tip. Back exam shows decreased tenderness over the lumbar and thoracic spine to palpation. Extremities shows no clubbing, cyanosis or edema. She has 4/5 strength in her legs. Skin exam shows improvement in the radiation dermatitis of the right breast and axilla. There is no open wound. There is some hyper pigmentation. Neurological exam is nonfocal.    Lab Results  Component Value Date   WBC 4.3 09/29/2015   HGB 13.0 09/29/2015   HCT 39.0 09/29/2015   MCV 91 09/29/2015   PLT 98* 09/29/2015     Chemistry      Component Value Date/Time   NA 142 09/01/2015 0851   NA 138 04/30/2015 1019   NA 138 03/19/2015 0312   K 3.9 09/01/2015 0851   K 3.5 04/30/2015 1019   K 3.8 03/19/2015 0312   CL 106 09/01/2015 0851   CL 101 03/19/2015 0312   CO2 24 09/01/2015 0851   CO2 29 04/30/2015 1019   CO2 29 03/19/2015 0312  BUN 17 09/01/2015 0851   BUN 9.7 04/30/2015 1019   BUN 7 03/19/2015 0312    CREATININE 0.5* 09/01/2015 0851   CREATININE 0.8 04/30/2015 1019   CREATININE 0.75 03/19/2015 0312      Component Value Date/Time   CALCIUM 8.6 09/01/2015 0851   CALCIUM 9.7 04/30/2015 1019   CALCIUM 8.7* 03/19/2015 0312   ALKPHOS 154* 09/01/2015 0851   ALKPHOS 257* 04/30/2015 1019   ALKPHOS 238* 03/06/2015 0425   AST 48* 09/01/2015 0851   AST 57* 04/30/2015 1019   AST 41 03/06/2015 0425   ALT 51* 09/01/2015 0851   ALT 41 04/30/2015 1019   ALT 38 03/06/2015 0425   BILITOT 0.70 09/01/2015 0851   BILITOT 0.60 04/30/2015 1019   BILITOT 0.4 03/06/2015 0425         Impression and Plan: Ms. Hook is 49 year old white female with triple positive metastatic breast cancer.  We will go ahead and give her the Herceptin and Perjeta. I think this really is helping her.   I will plan to see her back in 3 weeks. At that point time, we will just do the Herceptin and Perjeta.  As always, we had a very good prayer session.    [name], MD 12/7/201610:58 AM

## 2015-10-20 ENCOUNTER — Other Ambulatory Visit: Payer: Self-pay | Admitting: Orthopaedic Surgery

## 2015-10-20 ENCOUNTER — Encounter: Payer: Self-pay | Admitting: Radiation Therapy

## 2015-10-20 ENCOUNTER — Other Ambulatory Visit: Payer: Self-pay | Admitting: Radiation Therapy

## 2015-10-20 ENCOUNTER — Ambulatory Visit: Payer: 59 | Admitting: Hematology & Oncology

## 2015-10-20 ENCOUNTER — Other Ambulatory Visit: Payer: 59

## 2015-10-20 ENCOUNTER — Ambulatory Visit: Payer: 59

## 2015-10-20 DIAGNOSIS — C7931 Secondary malignant neoplasm of brain: Secondary | ICD-10-CM

## 2015-10-20 DIAGNOSIS — C7949 Secondary malignant neoplasm of other parts of nervous system: Principal | ICD-10-CM

## 2015-10-20 NOTE — Progress Notes (Addendum)
1.  Do you need a wheel chair?    no  2. On oxygen? no  3. Have you ever had any surgery in the body part being scanned?  no  4. Have you ever had any surgery on your brain or heart?  no                  5. Have you ever had surgery on your eyes or ears?  no                                   6. Do you have a pacemaker or defibrillator? no    7. Do you have a Neurostimulator? no  8. Claustrophobic?  no  9. Any risk for metal in eyes?  no  10. Injury by bullet, buckshot, or shrapnel?  no  11. Stent?  no                                                                                                                   12. Hx of Cancer? Breast cancer w/ mets to the brain                                                                                                                13. Kidney or Liver disease?  Yes, liver disease  14. Hx of Lupus, Rheumatoid Arthritis or Scleroderma?  no  15. IV Antibiotics or long term use of NSAIDS?  no  16. HX of Hypertension?  no  17. Diabetes?  no  18. Allergy to contrast?  no  19. Recent labs. Scheduled for labs on 1/19. Should be in [facility]

## 2015-10-21 ENCOUNTER — Other Ambulatory Visit (HOSPITAL_BASED_OUTPATIENT_CLINIC_OR_DEPARTMENT_OTHER): Payer: 59

## 2015-10-21 ENCOUNTER — Ambulatory Visit (HOSPITAL_BASED_OUTPATIENT_CLINIC_OR_DEPARTMENT_OTHER): Payer: 59 | Admitting: Hematology & Oncology

## 2015-10-21 ENCOUNTER — Ambulatory Visit (HOSPITAL_BASED_OUTPATIENT_CLINIC_OR_DEPARTMENT_OTHER): Payer: 59

## 2015-10-21 ENCOUNTER — Encounter: Payer: Self-pay | Admitting: Hematology & Oncology

## 2015-10-21 VITALS — BP 114/76 | HR 87 | Temp 97.7°F | Resp 18 | Ht 67.0 in | Wt 176.0 lb

## 2015-10-21 DIAGNOSIS — C50911 Malignant neoplasm of unspecified site of right female breast: Secondary | ICD-10-CM

## 2015-10-21 DIAGNOSIS — K529 Noninfective gastroenteritis and colitis, unspecified: Secondary | ICD-10-CM

## 2015-10-21 DIAGNOSIS — C50912 Malignant neoplasm of unspecified site of left female breast: Secondary | ICD-10-CM

## 2015-10-21 DIAGNOSIS — M899 Disorder of bone, unspecified: Secondary | ICD-10-CM | POA: Diagnosis not present

## 2015-10-21 DIAGNOSIS — G4701 Insomnia due to medical condition: Secondary | ICD-10-CM

## 2015-10-21 DIAGNOSIS — R5383 Other fatigue: Secondary | ICD-10-CM

## 2015-10-21 DIAGNOSIS — C7931 Secondary malignant neoplasm of brain: Secondary | ICD-10-CM | POA: Diagnosis not present

## 2015-10-21 DIAGNOSIS — R64 Cachexia: Secondary | ICD-10-CM

## 2015-10-21 LAB — CBC WITH DIFFERENTIAL (CANCER CENTER ONLY)
BASO#: 0 10*3/uL (ref 0.0–0.2)
BASO%: 0.4 % (ref 0.0–2.0)
EOS ABS: 0.1 10*3/uL (ref 0.0–0.5)
EOS%: 1.8 % (ref 0.0–7.0)
HEMATOCRIT: 40.5 % (ref 34.8–46.6)
HGB: 13.4 g/dL (ref 11.6–15.9)
LYMPH#: 0.6 10*3/uL — AB (ref 0.9–3.3)
LYMPH%: 10.6 % — ABNORMAL LOW (ref 14.0–48.0)
MCH: 29.9 pg (ref 26.0–34.0)
MCHC: 33.1 g/dL (ref 32.0–36.0)
MCV: 90 fL (ref 81–101)
MONO#: 0.4 10*3/uL (ref 0.1–0.9)
MONO%: 7.6 % (ref 0.0–13.0)
NEUT#: 4.4 10*3/uL (ref 1.5–6.5)
NEUT%: 79.6 % (ref 39.6–80.0)
Platelets: 92 10*3/uL — ABNORMAL LOW (ref 145–400)
RBC: 4.48 10*6/uL (ref 3.70–5.32)
RDW: 14.5 % (ref 11.1–15.7)
WBC: 5.6 10*3/uL (ref 3.9–10.0)

## 2015-10-21 LAB — CANCER ANTIGEN 27.29: CA 27.29: 20 U/mL (ref 0–39)

## 2015-10-21 LAB — CMP (CANCER CENTER ONLY)
ALBUMIN: 3.7 g/dL (ref 3.3–5.5)
ALK PHOS: 204 U/L — AB (ref 26–84)
ALT: 78 U/L — AB (ref 10–47)
AST: 84 U/L — AB (ref 11–38)
BILIRUBIN TOTAL: 0.8 mg/dL (ref 0.20–1.60)
BUN, Bld: 11 mg/dL (ref 7–22)
CALCIUM: 7.8 mg/dL — AB (ref 8.0–10.3)
CO2: 26 meq/L (ref 18–33)
CREATININE: 0.7 mg/dL (ref 0.6–1.2)
Chloride: 103 mEq/L (ref 98–108)
GLUCOSE: 99 mg/dL (ref 73–118)
Potassium: 3.6 mEq/L (ref 3.3–4.7)
SODIUM: 141 meq/L (ref 128–145)
Total Protein: 7.2 g/dL (ref 6.4–8.1)

## 2015-10-21 MED ORDER — LORAZEPAM 2 MG/ML IJ SOLN
INTRAMUSCULAR | Status: AC
  Filled 2015-10-21: qty 1

## 2015-10-21 MED ORDER — HEPARIN SOD (PORK) LOCK FLUSH 100 UNIT/ML IV SOLN
500.0000 [IU] | Freq: Once | INTRAVENOUS | Status: AC
  Administered 2015-10-21: 500 [IU] via INTRAVENOUS
  Filled 2015-10-21: qty 5

## 2015-10-21 MED ORDER — SODIUM CHLORIDE 0.9 % IV SOLN
Freq: Once | INTRAVENOUS | Status: AC
  Administered 2015-10-21: 12:00:00 via INTRAVENOUS
  Filled 2015-10-21: qty 8

## 2015-10-21 MED ORDER — LORAZEPAM 2 MG/ML IJ SOLN
0.5000 mg | Freq: Once | INTRAMUSCULAR | Status: AC
  Administered 2015-10-21: 0.5 mg via INTRAVENOUS

## 2015-10-21 MED ORDER — SODIUM CHLORIDE 0.9 % IV SOLN
INTRAVENOUS | Status: DC
  Administered 2015-10-21: 11:00:00 via INTRAVENOUS

## 2015-10-21 MED ORDER — SODIUM CHLORIDE 0.9 % IJ SOLN
10.0000 mL | INTRAMUSCULAR | Status: DC | PRN
  Administered 2015-10-21: 10 mL via INTRAVENOUS
  Filled 2015-10-21: qty 10

## 2015-10-21 NOTE — Progress Notes (Signed)
Hematology and Oncology Follow Up Visit  ADELAYDA CRITZ [id number] 08/05/1966 49 y.o. 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  Mcclarin is back for followup . She's not doing too well at all today. She got up this morning and felt pretty well. However, she seems to have developed some kind of gastroenteritis. She started having nausea and vomiting at home. She has some chills.   She did visit her father and family members over Christmas. She may have picked up something there.   She is could have surgery for the right rotator cuff and January. He probably will not be until January 24th. She is currently off Xeloda for the upcoming surgery.  Her CA-27-29 has been holding pretty steady.  She is noted some slight fullness in the right axilla. She's had this before.  She is not having any diarrhea.  Her appetite is okay. However, with this gastroenteritis, she reports not being as much.   Overall, her pain control seemly doing pretty well. She has the that rotator cuff which can only be fixed with surgery.  She still on oxygen. She has a paralyzed right hemidiaphragm..  Overall, her performance status is ECOG 2. .  Medications:  Current outpatient prescriptions:  .  albuterol (PROVENTIL HFA;VENTOLIN HFA) 108 (90 BASE) MCG/ACT inhaler, Inhale 2 puffs into the lungs every 4 (four) hours as needed for wheezing or shortness of breath., Disp: 2 Inhaler, Rfl: 6 .  ALPRAZolam (XANAX) 1 MG tablet, Take 1 tablet (1 mg total) by mouth at bedtime as needed for anxiety., Disp: 30 tablet, Rfl: 2 .  aspirin (ASPIRIN EC) 81 MG EC tablet, Take 81 mg by mouth daily. Swallow  whole., Disp: , Rfl:  .  Bismuth Subsalicylate (PEPTO-BISMOL PO), Take 1 tablet by mouth as needed (upset stomach). , Disp: , Rfl:  .  calcium carbonate (TUMS - DOSED IN MG ELEMENTAL CALCIUM) 500 MG chewable tablet, Chew 2 tablets by mouth 2 (two) times daily., Disp: , Rfl:  .  capecitabine (XELODA) 500 MG tablet, Take 4 tablets with food twice a day for 14 days, Disp: 112 tablet, Rfl: 3 .  diphenhydrAMINE (SOMINEX) 25 MG tablet, Take 100 mg by mouth at bedtime as needed for sleep. Pt states she is taking up to 200 mg Benadryl at night and still not sleeping, Disp: , Rfl:  .  docusate sodium (COLACE) 100 MG capsule, Take 1 capsule (100 mg total) by mouth 2 (two) times daily as needed for mild constipation or moderate constipation., Disp: 10 capsule, Rfl: 0 .  dronabinol (MARINOL) 10 MG capsule, Take 1 capsule (10 mg total) by mouth 3 (three) times daily before meals., Disp: 90 capsule, Rfl: 0 .  ergocalciferol (VITAMIN D2) 50000 UNITS capsule, Take 1 capsule (50,000 Units total) by mouth 2 (two) times a week., Disp: 8 capsule, Rfl: 6 .  fentaNYL (DURAGESIC - DOSED MCG/HR) 50 MCG/HR, Place 1 patch (50 mcg total) onto the skin every other day., Disp: 15 patch, Rfl: 0 .  HYDROmorphone (DILAUDID) 4 MG tablet, Take 1 tablet (4 mg total) by mouth every 4 (four) hours as needed for  severe pain., Disp: 90 tablet, Rfl: 0 .  ibuprofen (ADVIL,MOTRIN) 600 MG tablet, Take 1 tablet (600 mg total) by mouth every 8 (eight) hours as needed for moderate pain., Disp: 30 tablet, Rfl: 0 .  Lactulose SOLN, Take 2 TBLSP every 6 hours until bowel movement, Disp: 1000 mL, Rfl: 4 .  lidocaine (LIDODERM) 5 %, Place 1 patch onto the skin daily. Remove & Discard patch within 12 hours or as directed by MD, Disp: 20 patch, Rfl: 0 .  lidocaine (LIDODERM) 5 %, Place 1 patch onto the skin daily. Remove & Discard patch within 12 hours or as directed by MD, Disp: 30 patch, Rfl: 4 .  lidocaine (XYLOCAINE) 2 % solution, Use as directed 20  mLs in the mouth or throat as needed for mouth pain., Disp: 200 mL, Rfl: 2 .  lidocaine-prilocaine (EMLA) cream, Apply 1 application topically as needed., Disp: 30 g, Rfl: 6 .  loratadine-pseudoephedrine (CLARITIN-D 24-HOUR) 10-240 MG per 24 hr tablet, Take 1 tablet by mouth daily., Disp: , Rfl:  .  methylphenidate (RITALIN) 10 MG tablet, Take 1 tablet (10 mg total) by mouth 2 (two) times daily., Disp: 60 tablet, Rfl: 0 .  metoCLOPramide (REGLAN) 10 MG tablet, Take 1 tablet (10 mg total) by mouth every 6 (six) hours as needed for nausea., Disp: 120 tablet, Rfl: 8 .  Multiple Vitamin (MULTIVITAMIN) tablet, Take 1 tablet by mouth daily., Disp: , Rfl:  .  naloxegol oxalate (MOVANTIK) 25 MG TABS tablet, Take 1 tablet (25 mg total) by mouth daily., Disp: 30 tablet, Rfl: 6 .  NONFORMULARY OR COMPOUNDED ITEM, Take 5 mLs by mouth as needed (irritation). DUKES MOUTHWASH  SWISH AND SWALLOW, Disp: , Rfl:  .  OLANZapine (ZYPREXA) 5 MG tablet, Take 1 tablet (5 mg total) by mouth at bedtime., Disp: 30 tablet, Rfl: 3 .  ondansetron (ZOFRAN) 8 MG tablet, Take 1 tablet (8 mg total) by mouth every 8 (eight) hours as needed for nausea or vomiting., Disp: 60 tablet, Rfl: 2 .  pantoprazole (PROTONIX) 40 MG tablet, Take 1 tablet (40 mg total) by mouth daily., Disp: 30 tablet, Rfl: 6 .  pilocarpine (SALAGEN) 5 MG tablet, Take 1 tablet (5 mg total) by mouth 2 (two) times daily., Disp: 60 tablet, Rfl: 3 .  prochlorperazine (COMPAZINE) 10 MG tablet, Take 1 tablet (10 mg total) by mouth every 6 (six) hours as needed for nausea or vomiting., Disp: 60 tablet, Rfl: 2 .  promethazine (PHENERGAN) 25 MG suppository, Place 1 suppository (25 mg total) rectally every 6 (six) hours as needed for nausea or vomiting., Disp: 60 each, Rfl: 3 .  pyridoxine (B-6) 250 MG tablet, Take 1 tablet (250 mg total) by mouth daily., Disp: 90 tablet, Rfl: 4 .  traMADol (ULTRAM) 50 MG tablet, TAKE 1 TO 2 TABLETS BY MOUTH EVERY 6 HOURS AS NEEDED FOR PAIN.,  Disp: , Rfl: 0 .  traZODone (DESYREL) 100 MG tablet, TAKE 1 TO 2 TABLETS BY MOUTH AS NEEDED FOR SLEEP, Disp: 30 tablet, Rfl: 2 No current facility-administered medications for this visit.  Facility-Administered Medications Ordered in Other Visits:  .  0.9 %  sodium chloride infusion, , Intravenous, Continuous, [name], MD, Stopped at 10/21/15 1500 .  sodium chloride 0.9 % injection 10 mL, 10 mL, Intravenous, PRN, [name], MD, 10 mL at 10/21/15 1510  Allergies:  Allergies  Allergen Reactions  . Iohexol      Code: RASH, Desc: VERY STRONG FAMILY HX OF ANGIOEDEMA  WHEN RECEIVING IV CONTRAST; PT HAS BEEN PREMEDICATED FOR OTHER CONTRASTED STUDIES(IN CATH. LAB)  KR, Onset Date: [id number]   . Prednisone Itching    Capillary beds bust  . Tetanus Toxoids Other (See Comments)    Ran a high fever for 48 hours  . Theophyllines Hives    Mental changes  . Versed [Midazolam] Other (See Comments)    Pt becomes violent    Past Medical History, Surgical history, Social history, and Family History were reviewed and updated.  Review of Systems: As above  Physical Exam:  height is 5\' 7"  (1.702 m) and weight is 176 lb (79.833 kg). Her oral temperature is 97.7 F (36.5 C). Her blood pressure is 114/76 and her pulse is 87. Her respiration is 18.   Well-developed and well-nourished white female. Head and exam shows no ocular or oral lesions. She's able to move both eyes well. She has good extraocular muscle . There is no erythema or exudate from the left eye. There might be some slight fullness in the right axilla by do not detect any obvious adenopathy. I cannot detect any obvious right supraclavicular lymph nodes. She has improved range of motion of the right shoulder. Left supraclavicular region is okay. No masses are noted. Lungs are clear. Cardiac exam regular rate and rhythm with no murmurs, rubs or bruits.. Abdomen is soft. She has good bowel sounds. There is no palpable liver or spleen  tip. Back exam shows decreased tenderness over the lumbar and thoracic spine to palpation. Extremities shows no clubbing, cyanosis or edema. She has 4/5 strength in her legs. Skin exam shows improvement in the radiation dermatitis of the right breast and axilla. There is no open wound. There is some hyper pigmentation. Neurological exam is nonfocal.    Lab Results  Component Value Date   WBC 5.6 10/21/2015   HGB 13.4 10/21/2015   HCT 40.5 10/21/2015   MCV 90 10/21/2015   PLT 92* 10/21/2015     Chemistry      Component Value Date/Time   NA 141 10/21/2015 1012   NA 138 04/30/2015 1019   NA 138 03/19/2015 0312   K 3.6 10/21/2015 1012   K 3.5 04/30/2015 1019   K 3.8 03/19/2015 0312   CL 103 10/21/2015 1012   CL 101 03/19/2015 0312   CO2 26 10/21/2015 1012   CO2 29 04/30/2015 1019   CO2 29 03/19/2015 0312   BUN 11 10/21/2015 1012   BUN 9.7 04/30/2015 1019   BUN 7 03/19/2015 0312   CREATININE 0.7 10/21/2015 1012   CREATININE 0.8 04/30/2015 1019   CREATININE 0.75 03/19/2015 0312      Component Value Date/Time   CALCIUM 7.8* 10/21/2015 1012   CALCIUM 9.7 04/30/2015 1019   CALCIUM 8.7* 03/19/2015 0312   ALKPHOS 204* 10/21/2015 1012   ALKPHOS 257* 04/30/2015 1019   ALKPHOS 238* 03/06/2015 0425   AST 84* 10/21/2015 1012   AST 57* 04/30/2015 1019   AST 41 03/06/2015 0425   ALT 78* 10/21/2015 1012   ALT 41 04/30/2015 1019   ALT 38 03/06/2015 0425   BILITOT 0.80 10/21/2015 1012   BILITOT 0.60 04/30/2015 1019   BILITOT 0.4 03/06/2015 0425         Impression and Plan: Ms. Losier is 49 year old white female with triple positive metastatic breast cancer.  We will not treat her today. I think this gastroenteritis is a bigger problem for her.  We will give her IV fluids. We will give her  some anti-emetics.   I see her back next week and we will see how she is doing.  I really want to try to move up her surgery for the rotator cuff. I just don't want to see her off her  Xeloda for over a month.    [name], MD 12/29/20165:52 PM

## 2015-10-29 ENCOUNTER — Ambulatory Visit: Payer: 59

## 2015-10-29 ENCOUNTER — Ambulatory Visit: Payer: 59 | Admitting: Hematology & Oncology

## 2015-10-29 ENCOUNTER — Other Ambulatory Visit: Payer: 59

## 2015-11-02 NOTE — Progress Notes (Addendum)
Anesthesia PAT Evaluation: Patient is a 50 year old female scheduled for right arthroscopy for right shoulder impingement on 11/16/15 (first case) by Dr. Rhona Raider. Case is posted for Choice anesthesia ("regional" anesthesia if possible).   According to Dr. Jerald Kief 09/24/15 office note, "I spoke with Dr. [name] who thought she would be an appropriate candidate. Recent PET scan has shown very little breast cancer disease. Her O2 is only 2 liters nasal cannula. The exact etiology of her pulmonary situation is a bit confusing. Apparently she went in for some sort of sinus procedure in May and did not do well and has been on chronic O2 since. I would like for her to have a consultation with the anesthesiologist in the main OR to see if she is a candidate for surgery and whether we might be able to do this with a regional block. If we are able to do her operation my plan will be to perform an aggressive acromioplasty for a very prominent subacromial morphology and for debridement of some partial-thickness cuff tears which were seen on her MRI scan."  Patient preferred phone number: [phone]. Alternate number (sister Bianca Kent): [phone]. To my understanding Godfrey Pick, RN at [facility] is her healthcare POA and is trying to help coordinate with providers who will be caring for Bianca Kent during her hospitalization.  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 She underwent endoscopic nasal sinus surgery for left frontal ethmoid mucocele drainage on 5/11/6 by Dr. [name]. Her post-operative course was complicated by acute respiratory failure  with hypoxia and fever. She required supplemental O2 and was started on broad spectrum antibiotics due to concern on aspiration PNA. She was discharged home on O2/[place] on 03/22/15 (and still uses 2L/[place] around the clock). Hospitalist (IM), Pulmonology, Palliative Care (goals of care) and HEM-ONC were consulted during her admission.   For anesthesia concerns, she reported general concerns about her respiratory status, post-operative N/V and that her mother and sisters had issues of angioedema with anesthesia.  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   HEM-ONC is Dr. [name], last visit 10/21/15. He is hoping patient can have her shoulder surgery as planned as he does not want her off Xeloda for more than a month.    Meds list includes albuterol, Xanax, ASA 81 mg, Sominex, Marinol, Duragesic patch, Dilaudid, Lidoderm, Claritin-D, Ritalin, Reglan, Movantik, Zyprexa, Zofran, Protonix, pilocarpine, Compazine, tramado, trazodone. According to 10/21/15 oncology notes her current breast cancer therapy is: Herceptin every 3 weeks. Perjeta every 3 weeks. Xeloda 2000 mg PO BID (off for upcoming surgery 10/21/15). Zometa every 3 weeks. SRS to left posterior temporal metastasis. Her next chemotherapy visit is scheduled for 11/01/15.  11/08/15 EKG: NSR.  07/09/15 Echo: Study Conclusions - Left ventricle: The cavity size was normal. Systolic function was normal. The estimated ejection fraction was in the range of 55% to 60%. Wall motion was normal; there were no regional wall motion abnormalities. Left ventricular diastolic function parameters were normal. - Left atrium: The atrium was mildly dilated. - Impressions: Normal GLS -20.9. Impressions: - Normal GLS -20.9. (She had a negative echo bubble study on 03/16/15.)  07/02/14 NM Cardiac [place]: IMPRESSION: Left ventricular ejection  fraction = 58%, unchanged from 04/01/2014.  05/03/09 Cardiac cath (Dr. Shelva Majestic):  IMPRESSION: 1. Normal left ventricular function. 2. Normal coronary arteries. (Last seen by cardiologist Dr. Gwenlyn Found in 02/2014 for clearance prior to placing her Port-a-cath.)  03/12/15 DG Sniff Test: IMPRESSION: Findings consistent with paralysis of the right hemi diaphragm.  11/08/15 CXR: FINDINGS: There is elevation of the right diaphragm. There is mild right basilar atelectasis. There is no focal consolidation. There is no pleural effusion or pneumothorax. The heart and mediastinal contours are unremarkable. There is a left-sided Port-A-Cath in unchanged Position. The osseous structures are unremarkable. IMPRESSION: No active cardiopulmonary disease.  Preoperative labs noted. PLT 102K.  I and anesthesiologist Dr. Linna Caprice spoke with patient and her sister Bianca Kent during her PAT visit. Dr. Linna Caprice is recommending a pre-operative pulmonology consultation for consideration of PFTs and possible re-evaluation of right diaphragm paralysis (partial versus complete). He feels like the safest option is GETA. He would also consider a right interscalene nerve block if she had complete paralysis of her right diaphragm. He may also consider if there is only partially paralyzed, but explained that an interscalene nerve block may increase her chances of needing to stay of the ventilator post-operatively and would possibly need to stay on a ventilator until the block effects were gone. In general, he did discuss that she is at increased risk for pulmonary complications and that regardless of if she had a block or not, she may require ventilator support for a few hours post-operatively or even overnight. They want to make sure she can stay overnight for closer monitoring. With her history, we feel this is good choice. Dr. Linna Caprice plans to touch base with Dr. [name] and Dr. Rhona Raider. I also notified Juliann Pulse at Dr. Jerald Kief office that patient will need a pre-operative pulmonology evaluation (patient and sister requested  that it be someone other than Dr. Alva Garnet due to what they felt was a premature request for hospice to see her during her 02/2015 hospitalization).   George Hugh [facility] Short Stay Center/Anesthesiology Phone [phone] 11/08/2015 5:26 PM  Addendum: Dr. Linna Caprice spoke with a pulmonologist last week. It was determined that instead of an office visit, she would be set up for ABG and PFTs. These were done on 11/12/15 with results under the Results Review tab. Dr. Linna Caprice has spoken with Dr. Orene Desanctis who will be the anesthesiologist assigned to this case. He has reviewed ABG and PFT results. He will evaluate patient tomorrow and discuss definitive anesthesia plan at that time.  Of note, Dr. Linna Caprice also touched base with Dr. [name] with no additional recommendations from his standpoint. She had chemo and labs on 11/11/15 which appeared stable.  George Hugh [facility] Short Stay Center/Anesthesiology Phone [phone] 11/15/2015 10:58 AM

## 2015-11-08 ENCOUNTER — Encounter (HOSPITAL_COMMUNITY): Payer: Self-pay

## 2015-11-08 ENCOUNTER — Encounter (HOSPITAL_COMMUNITY)
Admission: RE | Admit: 2015-11-08 | Discharge: 2015-11-08 | Disposition: A | Discharge status: Home / Self Care | Payer: 59 | Source: Ambulatory Visit | Attending: Anesthesiology | Admitting: Anesthesiology

## 2015-11-08 ENCOUNTER — Encounter (HOSPITAL_COMMUNITY)
Admission: RE | Admit: 2015-11-08 | Discharge: 2015-11-08 | Disposition: A | Discharge status: Home / Self Care | Payer: 59 | Source: Ambulatory Visit | Attending: Orthopaedic Surgery | Admitting: Orthopaedic Surgery

## 2015-11-08 DIAGNOSIS — Z7982 Long term (current) use of aspirin: Secondary | ICD-10-CM | POA: Insufficient documentation

## 2015-11-08 DIAGNOSIS — R0602 Shortness of breath: Secondary | ICD-10-CM | POA: Diagnosis not present

## 2015-11-08 DIAGNOSIS — Z79899 Other long term (current) drug therapy: Secondary | ICD-10-CM | POA: Diagnosis not present

## 2015-11-08 DIAGNOSIS — G4733 Obstructive sleep apnea (adult) (pediatric): Secondary | ICD-10-CM | POA: Diagnosis not present

## 2015-11-08 DIAGNOSIS — Z01818 Encounter for other preprocedural examination: Secondary | ICD-10-CM | POA: Insufficient documentation

## 2015-11-08 DIAGNOSIS — Z01812 Encounter for preprocedural laboratory examination: Secondary | ICD-10-CM | POA: Diagnosis not present

## 2015-11-08 DIAGNOSIS — Z01811 Encounter for preprocedural respiratory examination: Secondary | ICD-10-CM

## 2015-11-08 DIAGNOSIS — J45909 Unspecified asthma, uncomplicated: Secondary | ICD-10-CM | POA: Insufficient documentation

## 2015-11-08 DIAGNOSIS — C50911 Malignant neoplasm of unspecified site of right female breast: Secondary | ICD-10-CM | POA: Diagnosis not present

## 2015-11-08 DIAGNOSIS — Z9981 Dependence on supplemental oxygen: Secondary | ICD-10-CM | POA: Diagnosis not present

## 2015-11-08 DIAGNOSIS — Z87891 Personal history of nicotine dependence: Secondary | ICD-10-CM | POA: Insufficient documentation

## 2015-11-08 DIAGNOSIS — M7541 Impingement syndrome of right shoulder: Secondary | ICD-10-CM | POA: Insufficient documentation

## 2015-11-08 DIAGNOSIS — Z923 Personal history of irradiation: Secondary | ICD-10-CM | POA: Diagnosis not present

## 2015-11-08 DIAGNOSIS — Z9221 Personal history of antineoplastic chemotherapy: Secondary | ICD-10-CM | POA: Diagnosis not present

## 2015-11-08 DIAGNOSIS — I10 Essential (primary) hypertension: Secondary | ICD-10-CM | POA: Diagnosis not present

## 2015-11-08 LAB — BASIC METABOLIC PANEL
ANION GAP: 9 (ref 5–15)
BUN: 6 mg/dL (ref 6–20)
CO2: 24 mmol/L (ref 22–32)
Calcium: 9.4 mg/dL (ref 8.9–10.3)
Chloride: 110 mmol/L (ref 101–111)
Creatinine, Ser: 0.78 mg/dL (ref 0.44–1.00)
GFR calc Af Amer: >60 mL/min (ref >60)
GLUCOSE: 144 mg/dL — AB (ref 65–99)
POTASSIUM: 4 mmol/L (ref 3.5–5.1)
Sodium: 143 mmol/L (ref 135–145)

## 2015-11-08 LAB — CBC
HCT: 46.6 % — ABNORMAL HIGH (ref 36.0–46.0)
HEMOGLOBIN: 15.7 g/dL — AB (ref 12.0–15.0)
MCH: 30.3 pg (ref 26.0–34.0)
MCHC: 33.7 g/dL (ref 30.0–36.0)
MCV: 89.8 fL (ref 78.0–100.0)
Platelets: 102 10*3/uL — ABNORMAL LOW (ref 150–400)
RBC: 5.19 MIL/uL — ABNORMAL HIGH (ref 3.87–5.11)
RDW: 14.5 % (ref 11.5–15.5)
WBC: 4.8 10*3/uL (ref 4.0–10.5)

## 2015-11-08 NOTE — Anesthesia Preprocedure Evaluation (Addendum)
Anesthesia Evaluation  Patient identified by MRN, date of birth, ID band Patient awake    Reviewed: Allergy & Precautions, NPO status , Unable to perform ROS - Chart review only  History of Anesthesia Complications (+) PONV  Airway Mallampati: II  TM Distance: >3 FB Neck ROM: Full    Dental  (+) Teeth Intact   Pulmonary shortness of breath, asthma , sleep apnea , former smoker,    breath sounds clear to auscultation       Cardiovascular hypertension,  Rhythm:Regular Rate:Normal    ------------------------------------------------------------------- History: PMH: Follow up monitoring for chemotherapy. Chest pain. Risk factors: Former tobacco use. Hypertension.  ------------------------------------------------------------------- Study Conclusions  - Left ventricle: The cavity size was normal. Systolic function was normal. The estimated ejection fraction was in the range of 55% to 60%. Wall motion was normal; there were no regional wall motion abnormalities. Left ventricular diastolic function parameters were normal. - Left atrium: The atrium was mildly dilated. - Impressions: Normal GLS -20.9.  Impressions:  - Normal GLS -20.9.  Transthoracic echocardiography. M-mode, limited 2D, limited spectral Doppler, and color Doppler. Birthdate: Patient birthdate: 1966/08/07. Age: Patient is 50 yr old. Sex: Gender: female. BMI: 24.7 kg/m^2. Blood pressure: 118/76 Patient status: Outpatient. Study date: Study date: 07/09/2015. Study time: 09:09 AM. Location: Echo laboratory.  -------------------------------------------------------------------     Neuro/Psych  Neuromuscular disease    GI/Hepatic GERD  ,  Endo/Other    Renal/GU      Musculoskeletal  (+) Arthritis ,   Abdominal   Peds  Hematology   Anesthesia Other Findings   Reproductive/Obstetrics                       Anesthesia  Physical Anesthesia Plan  ASA: III  Anesthesia Plan: General and Regional   Post-op Pain Management: GA combined w/ Regional for post-op pain   Induction: Intravenous  Airway Management Planned: Oral ETT  Additional Equipment:   Intra-op Plan:   Post-operative Plan: Extubation in OR and Possible Post-op intubation/ventilation  Informed Consent: I have reviewed the patients History and Physical, chart, labs and discussed the procedure including the risks, benefits and alternatives for the proposed anesthesia with the patient or authorized representative who has indicated his/her understanding and acceptance.   Dental advisory given  Plan Discussed with:   Anesthesia Plan Comments:        Anesthesia Quick Evaluation

## 2015-11-08 NOTE — Pre-Procedure Instructions (Addendum)
BRITTNEY CASTILLEJO  11/08/2015      [id number] [address] [place] [place] [address] Phone: [phone] Fax: [place], [place] [address] Phone: [phone] Fax: [phone]    Your procedure is scheduled on 11/16/15  Report to [facility] Admitting at 530 A.M.  Call this number if you have problems the morning of surgery:  [phone]   Remember:  Do not eat food or drink liquids after midnight.  Take these medicines the morning of surgery with A SIP OF WATER inhaler if needed, reglan if needed, fentanyl patch, dilaudid if needed, protonix  STOP all herbel meds, nsaids (aleve,naproxen,advil,ibuprofen) 5 days prior to surgery starting 11/11/15  Including vitamins(D,B-6, multi), aspirin,pepto bismol  STOP claritin D-24 now(anything with pseudoephedrine       Do not wear jewelry, make-up or nail polish.  Do not wear lotions, powders, or perfumes.  You may wear deodorant.  Do not shave 48 hours prior to surgery.  Men may shave face and neck.  Do not bring valuables to the hospital.  [facility] is not responsible for any belongings or valuables.  Contacts, dentures or bridgework may not be worn into surgery.  Leave your suitcase in the car.  After surgery it may be brought to your room.  For patients admitted to the hospital, discharge time will be determined by your treatment team.  Patients discharged the day of surgery will not be allowed to drive home.   Name and phone number of your driver:    Special instructions:   Special Instructions: [facility] - Preparing for Surgery  Before surgery, you can play an important role.  Because skin is not sterile, your skin needs to be as free of germs as possible.  You can reduce the number of germs on you skin by washing with CHG (chlorahexidine  gluconate) soap before surgery.  CHG is an antiseptic cleaner which kills germs and bonds with the skin to continue killing germs even after washing.  Please DO NOT use if you have an allergy to CHG or antibacterial soaps.  If your skin becomes reddened/irritated stop using the CHG and inform your nurse when you arrive at Short Stay.  Do not shave (including legs and underarms) for at least 48 hours prior to the first CHG shower.  You may shave your face.  Please follow these instructions carefully:   1.  Shower with CHG Soap the night before surgery and the morning of Surgery.  2.  If you choose to wash your hair, wash your hair first as usual with your normal shampoo.  3.  After you shampoo, rinse your hair and body thoroughly to remove the Shampoo.  4.  Use CHG as you would any other liquid soap.  You can apply chg directly  to the skin and wash gently with scrungie or a clean washcloth.  5.  Apply the CHG Soap to your body ONLY FROM THE NECK DOWN.  Do not use on open wounds or open sores.  Avoid contact with your eyes ears, mouth and genitals (private parts).  Wash genitals (private parts)       with your normal soap.  6.  Wash thoroughly, paying special attention to the area where your surgery will be performed.  7.  Thoroughly rinse your body with  warm water from the neck down.  8.  DO NOT shower/wash with your normal soap after using and rinsing off the CHG Soap.  9.  Pat yourself dry with a clean towel.            10.  Wear clean pajamas.            11.  Place clean sheets on your bed the night of your first shower and do not sleep with pets.  Day of Surgery  Do not apply any lotions/deodorants the morning of surgery.  Please wear clean clothes to the hospital/surgery center.  Please read over the following fact sheets that you were given. Pain Booklet, Coughing and Deep Breathing and Surgical Site Infection Prevention

## 2015-11-10 ENCOUNTER — Telehealth: Payer: Self-pay | Admitting: Pulmonary Disease

## 2015-11-10 DIAGNOSIS — J9601 Acute respiratory failure with hypoxia: Secondary | ICD-10-CM

## 2015-11-10 MED FILL — OLANZapine 5 MG TABS: 5 | 30 days supply | Qty: 30 | Fill #2

## 2015-11-10 MED FILL — traZODone HCL 100 MG TABS: 100 | 15 days supply | Qty: 30 | Fill #2

## 2015-11-10 MED FILL — ONDANSETRON HCL 8 MG TABLET: 8 | 20 days supply | Qty: 60 | Fill #1

## 2015-11-10 MED FILL — ALPRAZolam 1 MG TABS: 1 | 30 days supply | Qty: 30 | Fill #2

## 2015-11-10 NOTE — Telephone Encounter (Signed)
Pt has been scheduled for Friday 1/20 at 1:00 by Baxter Flattery. She notifies pt of appt.

## 2015-11-10 NOTE — Telephone Encounter (Signed)
I have called Baxter Flattery in Respiratory Therapy & left her a vm to call pt & schedule the pft & ABG. I will keep an eye on her appt desk & verify that tests have been scheduled.

## 2015-11-10 NOTE — Telephone Encounter (Signed)
Will forward to JN to make aware.  Nothing further needed at this time.

## 2015-11-10 NOTE — Telephone Encounter (Signed)
Dr. Ashok Cordia spoke with Dr. Lyda Perone. Pt is needing to have surgery. Pt has not been seen by our office. He wants to order ASAP RA ABG and full PFT's to be done this week at the hospital. All results needs to forwarded to Dr. Lyda Perone but per Dr. Ashok Cordia we can order tests under his name though but ensure results get to Dr. Lyda Perone.  No need for pulm c/s per Dr. Ashok Cordia.  Order's have been placed.  PCC's please advise if you can assist in helping getting these schedule? thanks

## 2015-11-11 ENCOUNTER — Other Ambulatory Visit (HOSPITAL_BASED_OUTPATIENT_CLINIC_OR_DEPARTMENT_OTHER): Payer: 59

## 2015-11-11 ENCOUNTER — Ambulatory Visit: Payer: 59

## 2015-11-11 ENCOUNTER — Other Ambulatory Visit: Payer: Self-pay | Admitting: *Deleted

## 2015-11-11 ENCOUNTER — Ambulatory Visit (HOSPITAL_BASED_OUTPATIENT_CLINIC_OR_DEPARTMENT_OTHER): Payer: 59 | Admitting: Hematology & Oncology

## 2015-11-11 ENCOUNTER — Other Ambulatory Visit: Payer: 59

## 2015-11-11 ENCOUNTER — Ambulatory Visit (HOSPITAL_BASED_OUTPATIENT_CLINIC_OR_DEPARTMENT_OTHER): Payer: 59

## 2015-11-11 ENCOUNTER — Encounter: Payer: Self-pay | Admitting: Hematology & Oncology

## 2015-11-11 ENCOUNTER — Ambulatory Visit: Payer: 59 | Admitting: Hematology & Oncology

## 2015-11-11 VITALS — BP 112/70 | HR 99 | Temp 98.1°F | Resp 16 | Wt 175.0 lb

## 2015-11-11 DIAGNOSIS — D508 Other iron deficiency anemias: Secondary | ICD-10-CM

## 2015-11-11 DIAGNOSIS — R112 Nausea with vomiting, unspecified: Secondary | ICD-10-CM | POA: Diagnosis not present

## 2015-11-11 DIAGNOSIS — G4701 Insomnia due to medical condition: Secondary | ICD-10-CM

## 2015-11-11 DIAGNOSIS — K909 Intestinal malabsorption, unspecified: Secondary | ICD-10-CM | POA: Diagnosis not present

## 2015-11-11 DIAGNOSIS — R5383 Other fatigue: Secondary | ICD-10-CM

## 2015-11-11 DIAGNOSIS — C50911 Malignant neoplasm of unspecified site of right female breast: Secondary | ICD-10-CM

## 2015-11-11 DIAGNOSIS — C50919 Malignant neoplasm of unspecified site of unspecified female breast: Secondary | ICD-10-CM | POA: Diagnosis not present

## 2015-11-11 DIAGNOSIS — C50912 Malignant neoplasm of unspecified site of left female breast: Secondary | ICD-10-CM

## 2015-11-11 DIAGNOSIS — R64 Cachexia: Secondary | ICD-10-CM

## 2015-11-11 DIAGNOSIS — Z5112 Encounter for antineoplastic immunotherapy: Secondary | ICD-10-CM | POA: Diagnosis not present

## 2015-11-11 HISTORY — DX: Intestinal malabsorption, unspecified: K90.9

## 2015-11-11 HISTORY — DX: Other iron deficiency anemias: D50.8

## 2015-11-11 LAB — CMP (CANCER CENTER ONLY)
ALT(SGPT): 55 U/L — ABNORMAL HIGH (ref 10–47)
AST: 56 U/L — AB (ref 11–38)
Albumin: 4 g/dL (ref 3.3–5.5)
Alkaline Phosphatase: 151 U/L — ABNORMAL HIGH (ref 26–84)
BILIRUBIN TOTAL: 0.7 mg/dL (ref 0.20–1.60)
BUN: 12 mg/dL (ref 7–22)
CO2: 25 meq/L (ref 18–33)
CREATININE: 1 mg/dL (ref 0.6–1.2)
Calcium: 8.4 mg/dL (ref 8.0–10.3)
Chloride: 104 mEq/L (ref 98–108)
GLUCOSE: 97 mg/dL (ref 73–118)
Potassium: 4.3 mEq/L (ref 3.3–4.7)
SODIUM: 139 meq/L (ref 128–145)
Total Protein: 7.5 g/dL (ref 6.4–8.1)

## 2015-11-11 LAB — CBC WITH DIFFERENTIAL (CANCER CENTER ONLY)
BASO#: 0 10*3/uL (ref 0.0–0.2)
BASO%: 0.6 % (ref 0.0–2.0)
EOS%: 2.5 % (ref 0.0–7.0)
Eosinophils Absolute: 0.1 10*3/uL (ref 0.0–0.5)
HCT: 41.4 % (ref 34.8–46.6)
HEMOGLOBIN: 14 g/dL (ref 11.6–15.9)
LYMPH#: 1 10*3/uL (ref 0.9–3.3)
LYMPH%: 19.8 % (ref 14.0–48.0)
MCH: 29.8 pg (ref 26.0–34.0)
MCHC: 33.8 g/dL (ref 32.0–36.0)
MCV: 88 fL (ref 81–101)
MONO#: 0.5 10*3/uL (ref 0.1–0.9)
MONO%: 9.5 % (ref 0.0–13.0)
NEUT%: 67.6 % (ref 39.6–80.0)
NEUTROS ABS: 3.3 10*3/uL (ref 1.5–6.5)
PLATELETS: 102 10*3/uL — AB (ref 145–400)
RBC: 4.7 10*6/uL (ref 3.70–5.32)
RDW: 14.3 % (ref 11.1–15.7)
WBC: 4.8 10*3/uL (ref 3.9–10.0)

## 2015-11-11 MED ORDER — TRASTUZUMAB CHEMO INJECTION 440 MG
6.0000 mg/kg | Freq: Once | INTRAVENOUS | Status: AC
  Administered 2015-11-11: 483 mg via INTRAVENOUS
  Filled 2015-11-11: qty 23

## 2015-11-11 MED ORDER — HYDROMORPHONE HCL 4 MG PO TABS: 4.0000 mg | ORAL_TABLET | ORAL | Status: DC | PRN

## 2015-11-11 MED ORDER — ZOLEDRONIC ACID 4 MG/100ML IV SOLN
4.0000 mg | Freq: Once | INTRAVENOUS | Status: AC
  Administered 2015-11-11: 4 mg via INTRAVENOUS
  Filled 2015-11-11: qty 100

## 2015-11-11 MED ORDER — DIPHENHYDRAMINE HCL 25 MG PO CAPS
50.0000 mg | ORAL_CAPSULE | Freq: Once | ORAL | Status: AC
  Administered 2015-11-11: 50 mg via ORAL

## 2015-11-11 MED ORDER — SODIUM CHLORIDE 0.9 % IV SOLN
Freq: Once | INTRAVENOUS | Status: AC
  Administered 2015-11-11: 13:00:00 via INTRAVENOUS

## 2015-11-11 MED ORDER — SODIUM CHLORIDE 0.9 % IV SOLN
510.0000 mg | Freq: Once | INTRAVENOUS | Status: AC
  Administered 2015-11-11: 510 mg via INTRAVENOUS
  Filled 2015-11-11: qty 17

## 2015-11-11 MED ORDER — HEPARIN SOD (PORK) LOCK FLUSH 100 UNIT/ML IV SOLN
500.0000 [IU] | Freq: Once | INTRAVENOUS | Status: AC | PRN
  Administered 2015-11-11: 500 [IU]
  Filled 2015-11-11: qty 5

## 2015-11-11 MED ORDER — SODIUM CHLORIDE 0.9 % IV SOLN
420.0000 mg | Freq: Once | INTRAVENOUS | Status: AC
  Administered 2015-11-11: 420 mg via INTRAVENOUS
  Filled 2015-11-11: qty 14

## 2015-11-11 MED ORDER — ACETAMINOPHEN 325 MG PO TABS
ORAL_TABLET | ORAL | Status: AC
  Filled 2015-11-11: qty 2

## 2015-11-11 MED ORDER — ACETAMINOPHEN 325 MG PO TABS
650.0000 mg | ORAL_TABLET | Freq: Once | ORAL | Status: AC
  Administered 2015-11-11: 650 mg via ORAL

## 2015-11-11 MED ORDER — SODIUM CHLORIDE 0.9 % IJ SOLN
10.0000 mL | INTRAMUSCULAR | Status: DC | PRN
  Administered 2015-11-11: 10 mL
  Filled 2015-11-11: qty 10

## 2015-11-11 MED ORDER — DIPHENHYDRAMINE HCL 25 MG PO CAPS
ORAL_CAPSULE | ORAL | Status: AC
  Filled 2015-11-11: qty 2

## 2015-11-11 MED FILL — HYDROmorphone HCL 4 MG TABS: 4 | 15 days supply | Qty: 90 | Fill #0

## 2015-11-11 NOTE — Patient Instructions (Signed)
[facility] Discharge Instructions for Patients Receiving Chemotherapy  Today you received the following chemotherapy agents Herceptin, Perjeta, and Zometa  To help prevent nausea and vomiting after your treatment, we encourage you to take your nausea medication    If you develop nausea and vomiting that is not controlled by your nausea medication, call the clinic.   BELOW ARE SYMPTOMS THAT SHOULD BE REPORTED IMMEDIATELY:  *FEVER GREATER THAN 100.5 F  *CHILLS WITH OR WITHOUT FEVER  NAUSEA AND VOMITING THAT IS NOT CONTROLLED WITH YOUR NAUSEA MEDICATION  *UNUSUAL SHORTNESS OF BREATH  *UNUSUAL BRUISING OR BLEEDING  TENDERNESS IN MOUTH AND THROAT WITH OR WITHOUT PRESENCE OF ULCERS  *URINARY PROBLEMS  *BOWEL PROBLEMS  UNUSUAL RASH Items with * indicate a potential emergency and should be followed up as soon as possible.  Feel free to call the clinic you have any questions or concerns. The clinic phone number is [phone].  Please show the [facility] at check-in to the Emergency Department and triage nurse.

## 2015-11-11 NOTE — Progress Notes (Signed)
Hematology and Oncology Follow Up Visit  Bianca Kent [id number] [birth year] 50 y.o. 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 She is looking a lot better. She feels better. She had this gastroneuritis phone last saw her.  She will have her right shoulder surgery on Tuesday area and this will be January 24. I spoke to her anesthesiologist yesterday. She will be gone for some right function test tomorrow. She may be on a ventilator for a day or so afterwards depending on the results of the PFTs.  Her breast cancer is not been a real problem for his. Her last CA 27.29 in late December was only 20.  She has had some elevated LFTs. I think that might be from the Xeloda.  Her appetite is good. She's not having any problems with bowels or bladder. She's not having any nausea or vomiting right now.  She is still wearing her supplemental oxygen. She still gets dyspneic when she has activity.  Her iron studies are on the low side. In no she is not anemic, I will go ahead and give her a dose of IV iron. I think this might help her recover from her surgery and maybe give her a little better diaphragmatic function.  Overall, her performance status is ECOG 1. .  Medications:  Current outpatient prescriptions:  .  albuterol (PROVENTIL HFA;VENTOLIN HFA) 108 (90 BASE) MCG/ACT inhaler, Inhale 2 puffs into the lungs every 4 (four) hours as needed for wheezing or shortness of breath., Disp: 2 Inhaler, Rfl: 6 .  ALPRAZolam (XANAX) 1 MG tablet, Take 1 tablet (1 mg total) by mouth at bedtime as needed for anxiety., Disp: 30 tablet, Rfl: 2 .   aspirin (ASPIRIN EC) 81 MG EC tablet, Take 81 mg by mouth daily. Swallow whole., Disp: , Rfl:  .  Bismuth Subsalicylate (PEPTO-BISMOL PO), Take 1 tablet by mouth as needed (upset stomach). , Disp: , Rfl:  .  calcium carbonate (TUMS - DOSED IN MG ELEMENTAL CALCIUM) 500 MG chewable tablet, Chew 2 tablets by mouth 2 (two) times daily., Disp: , Rfl:  .  capecitabine (XELODA) 500 MG tablet, Take 4 tablets with food twice a day for 14 days, Disp: 112 tablet, Rfl: 3 .  diphenhydrAMINE (SOMINEX) 25 MG tablet, Take 100 mg by mouth at bedtime as needed for sleep. Pt states she is taking up to 200 mg Benadryl at night and still not sleeping, Disp: , Rfl:  .  docusate sodium (COLACE) 100 MG capsule, Take 1 capsule (100 mg total) by mouth 2 (two) times daily as needed for mild constipation or moderate constipation., Disp: 10 capsule, Rfl: 0 .  dronabinol (MARINOL) 10 MG capsule, Take 1 capsule (10 mg total) by mouth 3 (three) times daily before meals., Disp: 90 capsule, Rfl: 0 .  ergocalciferol (VITAMIN D2) 50000 UNITS capsule, Take 1 capsule (50,000 Units total) by mouth 2 (two) times a week., Disp: 8 capsule, Rfl: 6 .  fentaNYL (DURAGESIC - DOSED MCG/HR) 50 MCG/HR, Place 1 patch (50 mcg total) onto the skin every other day., Disp: 15 patch, Rfl:  0 .  HYDROmorphone (DILAUDID) 4 MG tablet, Take 1 tablet (4 mg total) by mouth every 4 (four) hours as needed for severe pain., Disp: 90 tablet, Rfl: 0 .  ibuprofen (ADVIL,MOTRIN) 600 MG tablet, Take 1 tablet (600 mg total) by mouth every 8 (eight) hours as needed for moderate pain. (Patient not taking: Reported on 11/08/2015), Disp: 30 tablet, Rfl: 0 .  Lactulose SOLN, Take 2 TBLSP every 6 hours until bowel movement (Patient taking differently: Take 30 mLs by mouth every 6 (six) hours as needed. Take 2 TBLSP every 6 hours until bowel movement), Disp: 1000 mL, Rfl: 4 .  lidocaine (LIDODERM) 5 %, Place 1 patch onto the skin daily. Remove & Discard patch within 12 hours or as  directed by MD, Disp: 20 patch, Rfl: 0 .  lidocaine (LIDODERM) 5 %, Place 1 patch onto the skin daily. Remove & Discard patch within 12 hours or as directed by MD (Patient not taking: Reported on 11/08/2015), Disp: 30 patch, Rfl: 4 .  lidocaine (XYLOCAINE) 2 % solution, Use as directed 20 mLs in the mouth or throat as needed for mouth pain., Disp: 200 mL, Rfl: 2 .  lidocaine-prilocaine (EMLA) cream, Apply 1 application topically as needed., Disp: 30 g, Rfl: 6 .  loratadine-pseudoephedrine (CLARITIN-D 24-HOUR) 10-240 MG per 24 hr tablet, Take 1 tablet by mouth daily., Disp: , Rfl:  .  methylphenidate (RITALIN) 10 MG tablet, Take 1 tablet (10 mg total) by mouth 2 (two) times daily., Disp: 60 tablet, Rfl: 0 .  metoCLOPramide (REGLAN) 10 MG tablet, Take 1 tablet (10 mg total) by mouth every 6 (six) hours as needed for nausea., Disp: 120 tablet, Rfl: 8 .  Multiple Vitamin (MULTIVITAMIN) tablet, Take 1 tablet by mouth daily., Disp: , Rfl:  .  naloxegol oxalate (MOVANTIK) 25 MG TABS tablet, Take 1 tablet (25 mg total) by mouth daily., Disp: 30 tablet, Rfl: 6 .  NONFORMULARY OR COMPOUNDED ITEM, Take 5 mLs by mouth as needed (irritation). DUKES MOUTHWASH  SWISH AND SWALLOW, Disp: , Rfl:  .  OLANZapine (ZYPREXA) 5 MG tablet, Take 1 tablet (5 mg total) by mouth at bedtime., Disp: 30 tablet, Rfl: 3 .  ondansetron (ZOFRAN) 8 MG tablet, Take 1 tablet (8 mg total) by mouth every 8 (eight) hours as needed for nausea or vomiting., Disp: 60 tablet, Rfl: 2 .  pantoprazole (PROTONIX) 40 MG tablet, Take 1 tablet (40 mg total) by mouth daily., Disp: 30 tablet, Rfl: 6 .  pilocarpine (SALAGEN) 5 MG tablet, Take 1 tablet (5 mg total) by mouth 2 (two) times daily., Disp: 60 tablet, Rfl: 3 .  prochlorperazine (COMPAZINE) 10 MG tablet, Take 1 tablet (10 mg total) by mouth every 6 (six) hours as needed for nausea or vomiting., Disp: 60 tablet, Rfl: 2 .  promethazine (PHENERGAN) 25 MG suppository, Place 1 suppository (25 mg total)  rectally every 6 (six) hours as needed for nausea or vomiting., Disp: 60 each, Rfl: 3 .  pyridoxine (B-6) 250 MG tablet, Take 1 tablet (250 mg total) by mouth daily. (Patient not taking: Reported on 11/08/2015), Disp: 90 tablet, Rfl: 4 .  traMADol (ULTRAM) 50 MG tablet, TAKE 1 TO 2 TABLETS BY MOUTH EVERY 6 HOURS AS NEEDED FOR PAIN., Disp: , Rfl: 0 .  traZODone (DESYREL) 100 MG tablet, TAKE 1 TO 2 TABLETS BY MOUTH AS NEEDED FOR SLEEP, Disp: 30 tablet, Rfl: 2  Allergies:  Allergies  Allergen Reactions  . Iohexol      Code:  RASH, Desc: VERY STRONG FAMILY HX OF ANGIOEDEMA WHEN RECEIVING IV CONTRAST; PT HAS BEEN PREMEDICATED FOR OTHER CONTRASTED STUDIES(IN CATH. LAB)  KR, Onset Date: [id number]   . Prednisone Itching    Capillary beds bust  . Tetanus Toxoids Other (See Comments)    Ran a high fever for 48 hours  . Theophyllines Hives    Mental changes  . Versed [Midazolam] Other (See Comments)    Pt becomes violent    Past Medical History, Surgical history, Social history, and Family History were reviewed and updated.  Review of Systems: As above  Physical Exam:  weight is 175 lb (79.379 kg). Her oral temperature is 98.1 F (36.7 C). Her blood pressure is 112/70 and her pulse is 99. Her respiration is 16.   Well-developed and well-nourished white female. Head and exam shows no ocular or oral lesions. She's able to move both eyes well. She has good extraocular muscle . There is no erythema or exudate from the left eye. There might be some slight fullness in the right axilla by do not detect any obvious adenopathy. I cannot detect any obvious right supraclavicular lymph nodes. She has improved range of motion of the right shoulder. Left supraclavicular region is okay. No masses are noted. Lungs are clear. Cardiac exam regular rate and rhythm with no murmurs, rubs or bruits.. Abdomen is soft. She has good bowel sounds. There is no palpable liver or spleen tip. Back exam shows decreased tenderness  over the lumbar and thoracic spine to palpation. Extremities shows no clubbing, cyanosis or edema. She has 4/5 strength in her legs. Skin exam shows improvement in the radiation dermatitis of the right breast and axilla. There is no open wound. There is some hyper pigmentation. Neurological exam is nonfocal.    Lab Results  Component Value Date   WBC 4.8 11/11/2015   HGB 14.0 11/11/2015   HCT 41.4 11/11/2015   MCV 88 11/11/2015   PLT 102* 11/11/2015     Chemistry      Component Value Date/Time   NA 139 11/11/2015 0941   NA 143 11/08/2015 1047   NA 138 04/30/2015 1019   K 4.3 11/11/2015 0941   K 4.0 11/08/2015 1047   K 3.5 04/30/2015 1019   CL 104 11/11/2015 0941   CL 110 11/08/2015 1047   CO2 25 11/11/2015 0941   CO2 24 11/08/2015 1047   CO2 29 04/30/2015 1019   BUN 12 11/11/2015 0941   BUN 6 11/08/2015 1047   BUN 9.7 04/30/2015 1019   CREATININE 1.0 11/11/2015 0941   CREATININE 0.78 11/08/2015 1047   CREATININE 0.8 04/30/2015 1019      Component Value Date/Time   CALCIUM 8.4 11/11/2015 0941   CALCIUM 9.4 11/08/2015 1047   CALCIUM 9.7 04/30/2015 1019   ALKPHOS 151* 11/11/2015 0941   ALKPHOS 257* 04/30/2015 1019   ALKPHOS 238* 03/06/2015 0425   AST 56* 11/11/2015 0941   AST 57* 04/30/2015 1019   AST 41 03/06/2015 0425   ALT 55* 11/11/2015 0941   ALT 41 04/30/2015 1019   ALT 38 03/06/2015 0425   BILITOT 0.70 11/11/2015 0941   BILITOT 0.60 04/30/2015 1019   BILITOT 0.4 03/06/2015 0425         Impression and Plan: Bianca Kent is 50 year old white female with triple positive metastatic breast cancer.  She looks a lot better. She will have her surgery on Tuesday. From my point of view, I don't see any problems with her having the  right shoulder surgery. Her labs look good.  I don't see any issues with her having the Herceptin/Perjeta . We still have her off Xeloda. I told her to restart the Xeloda the Monday after her surgery. The fact that her liver functions as  her better, maybe think that the Xeloda can be causing the transient chemical hepatitis.  We will plan to get her back here in about one month. By then, I think that the shoulder should be doing a whole lot better and hopefully she will have much less pain.  I spent about 25 minutes with she and her sister. As always, we had a very good prayer session.    [name], MD 1/19/201712:05 PM

## 2015-11-12 ENCOUNTER — Ambulatory Visit (HOSPITAL_COMMUNITY)
Admission: RE | Admit: 2015-11-12 | Discharge: 2015-11-12 | Disposition: A | Discharge status: Home / Self Care | Payer: 59 | Source: Ambulatory Visit | Attending: Pulmonary Disease | Admitting: Pulmonary Disease

## 2015-11-12 DIAGNOSIS — J9601 Acute respiratory failure with hypoxia: Secondary | ICD-10-CM | POA: Insufficient documentation

## 2015-11-12 LAB — PULMONARY FUNCTION TEST
DL/VA % PRED: 65 %
DL/VA: 3.37 ml/min/mmHg/L
DLCO COR: 6.36 ml/min/mmHg
DLCO UNC % PRED: 22 %
DLCO cor % pred: 22 %
DLCO unc: 6.4 ml/min/mmHg
FEF 25-75 PRE: 1.03 L/s
FEF 25-75 Post: 2.1 L/sec
FEF2575-%CHANGE-POST: 103 %
FEF2575-%PRED-POST: 72 %
FEF2575-%Pred-Pre: 35 %
FEV1-%CHANGE-POST: 17 %
FEV1-%Pred-Post: 41 %
FEV1-%Pred-Pre: 34 %
FEV1-PRE: 1.07 L
FEV1-Post: 1.26 L
FEV1FVC-%CHANGE-POST: 8 %
FEV1FVC-%PRED-PRE: 103 %
FEV6-%Change-Post: 9 %
FEV6-%PRED-POST: 37 %
FEV6-%Pred-Pre: 34 %
FEV6-PRE: 1.29 L
FEV6-Post: 1.41 L
FEV6FVC-%Pred-Post: 103 %
FEV6FVC-%Pred-Pre: 103 %
FVC-%Change-Post: 9 %
FVC-%PRED-POST: 36 %
FVC-%Pred-Pre: 33 %
FVC-PRE: 1.29 L
FVC-Post: 1.41 L
POST FEV1/FVC RATIO: 90 %
PRE FEV6/FVC RATIO: 100 %
Post FEV6/FVC ratio: 100 %
Pre FEV1/FVC ratio: 83 %
RV % PRED: 72 %
RV: 1.42 L
TLC % pred: 53 %
TLC: 2.95 L

## 2015-11-12 MED ORDER — ALBUTEROL SULFATE (2.5 MG/3ML) 0.083% IN NEBU
2.5000 mg | INHALATION_SOLUTION | Freq: Once | RESPIRATORY_TRACT | Status: AC
  Administered 2015-11-12: 2.5 mg via RESPIRATORY_TRACT

## 2015-11-15 LAB — BLOOD GAS, ARTERIAL
Acid-Base Excess: 0.9 mmol/L (ref 0.0–2.0)
BICARBONATE: 25 meq/L — AB (ref 20.0–24.0)
DRAWN BY: 21179
FIO2: 0.21
O2 SAT: 93.3 %
PATIENT TEMPERATURE: 98.6
PH ART: 7.408 (ref 7.350–7.450)
TCO2: 22.2 mmol/L (ref 0–100)
pCO2 arterial: 40.5 mmHg (ref 35.0–45.0)
pO2, Arterial: 67.1 mmHg — ABNORMAL LOW (ref 80.0–100.0)

## 2015-11-15 MED ORDER — CHLORHEXIDINE GLUCONATE 4 % EX LIQD: 60.0000 mL | Freq: Once | CUTANEOUS | Status: DC

## 2015-11-15 MED ORDER — CEFAZOLIN SODIUM-DEXTROSE 2-3 GM-% IV SOLR
2.0000 g | INTRAVENOUS | Status: AC
  Administered 2015-11-16: 2 g via INTRAVENOUS
  Filled 2015-11-15: qty 50

## 2015-11-15 MED ORDER — LACTATED RINGERS IV SOLN: INTRAVENOUS | Status: DC

## 2015-11-15 NOTE — Progress Notes (Signed)
Call to A. Zelenak,PA-C, she will follow up with Dr. Linna Caprice based on  the PFT is done (11/12/2015)

## 2015-11-15 NOTE — H&P (Signed)
Bianca Kent is an 50 y.o. female.   Chief Complaint: Right shoulder pain HPI: Bianca Kent continues with some terrible right shoulder pain. We've injected her on 4 occasions at this point. She cannot sleep at night. She cannot use her arm in front of herself or overhead. She is here with her mother who is quite concerned. Her pain is constant and severe. She feels her motion is somewhat better. She would like something done  Imaging/Tests: Right shoulder MRI scan date of service is 02/23/15 show type III acromion with subacromial bursitis and adenopathy  Past Medical History  Diagnosis Date  . Allergy   . Asthma   . Pneumonia     hx of  . UTI (lower urinary tract infection)     Due to small ureters  . Radiation 08/06/14-09/16/14    right breast, axillary, supraclavicular region   . Cancer [facility])     right breast & lymph nodes  . Metastasis from breast cancer ([place]) 11/26/14    MRI Brain  . Breast cancer ([place]) 02/2014    right  . History of radiation therapy 12/16/14    Left temporal tumor, SRS treatment  . Shortness of breath dyspnea     "side effect of Chemo"  . GERD (gastroesophageal reflux disease)   . Arthritis     Osteo arthritis  . Family history of anesthesia complication     Mother and sisters has angioedema  . Sleep apnea     Does not use machine patient stated "its not bad enough for that"  . 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 PONV (postoperative nausea and vomiting)     agitated  . Hypertension     pt reports that while taking medication for coronary spasms caused elevated blood pressure. Pt no longer takes meds for coronary spasms or HTN.  . 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 Malabsorption of iron 11/11/2015    Past Surgical History  Procedure Laterality Date  . Cholecystectomy    . Knee surgery Right     arthroscopy- cleaned menicus  . Tendon repair Left     index finger  . Tonsillectomy    . Sinus surgery with instatrak      without  instatrak  . Tubal ligation    . Colonoscopy w/ polypectomy  2008  . Wisdom tooth extraction    . Cardiac catheterization  X 3    no PCI  . Portacath placement Left 03/26/2014    Procedure: INSERTION PORT-A-CATH;  Surgeon: Rolm Bookbinder, MD;  Location: [facility];  Service: General;  Laterality: Left;  . Breast biopsy with sentinel lymph node biopsy and needle localization Right   . Sinusotomy  03/03/2015  . Sinus endo w/fusion Bilateral 03/03/2015    Procedure: ENDOSCOPIC SINUS SURGERY WITH NAVIGATION;  Surgeon: [name], MD;  Location: [facility] OR;  Service: ENT;  Laterality: Bilateral;    Family History  Problem Relation Age of Onset  . Coronary artery disease Mother   . Diabetes Mother   . Hypertension Mother   . Coronary artery disease Father   . Hypertension Father   . Hypertension Sister   . Coronary artery disease Maternal Grandmother   . Stroke Maternal Grandmother   . Depression Maternal Grandmother   . Cancer Maternal Grandmother     colon  . Coronary artery disease Maternal Grandfather   . Stroke Maternal Grandfather   . Depression Maternal Grandfather   . Coronary artery disease Paternal  Grandmother   . Cancer Paternal Grandmother   . Cancer Paternal Grandfather     lung  . Hypertension Sister   . Cancer Paternal Aunt     breast   Social History:  reports that she quit smoking about 8 years ago. Her smoking use included Cigarettes. She started smoking about 31 years ago. She has a 12 pack-year smoking history. She has never used smokeless tobacco. She reports that she drinks alcohol. She reports that she does not use illicit drugs.  Allergies:  Allergies  Allergen Reactions  . Iohexol      Code: RASH, Desc: VERY STRONG FAMILY HX OF ANGIOEDEMA WHEN RECEIVING IV CONTRAST; PT HAS BEEN PREMEDICATED FOR OTHER CONTRASTED STUDIES(IN CATH. LAB)  KR, Onset Date: [id number]   . Prednisone Itching    Capillary beds bust  . Tetanus Toxoids Other (See Comments)    Ran a high  fever for 48 hours  . Theophyllines Hives    Mental changes  . Versed [Midazolam] Other (See Comments)    Pt becomes violent    No prescriptions prior to admission    No results found for this or any previous visit (from the past 60 hour(s)). No results found.  Review of Systems  Musculoskeletal: Positive for joint pain.       Right shoulder  All other systems reviewed and are negative.   Last menstrual period 04/22/2014. Physical Exam  Constitutional: She is oriented to person, place, and time. She appears well-developed.  HENT:  Head: Normocephalic and atraumatic.  Eyes: Pupils are equal, round, and reactive to light.  Neck: Normal range of motion.  Cardiovascular: Normal rate.   Respiratory: Effort normal.  GI: Soft.  Musculoskeletal:  Right shoulder motion today is fairly good to about 140 of forward flexion with external rotation to about 60 and internal rotation to her back pocket. She has terribly painful primary and secondary impingement testing. Cuff strength is fairly good though very painful to resisted forward flexion. Cervical motion is good and there is no palpable lymphadenopathy. Sensation and motor function are intact in her hands with palpable pulses on both sides.  Neurological: She is alert and oriented to person, place, and time.  Skin: Skin is warm and dry.  Psychiatric: She has a normal mood and affect. Her behavior is normal. Judgment and thought content normal.     Assessment/Plan Assessment: Right shoulder impingement and resolving adhesive capsulitis injected x4  Plan: Bianca Kent continues with some terrible pain. I think we can help her with an arthroscopy but want to make sure that this is done as safely as possible. She would obviously be at increased risk for infection and complications with the anesthesia and I reviewed that with both she and her mother today. I spoke with Dr [name] who thought she would be an appropriate candidate. Recent PET scan  has shown very little breast cancer disease. Her O2 is only 2 liters nasal cannula. The exact etiology of her pulmonary situation is a bit confusing. Apparently she went in for some sort of sinus procedure in May and did not do well and has been on chronic O2 since. I would like for her to have a consultation with the anesthesiologist in the main OR to see if she is a candidate for surgery and whether we might be able to do this with a regional block.   Genesis Novosad, Larwance Sachs 11/15/2015, 10:38 AM

## 2015-11-16 ENCOUNTER — Ambulatory Visit (HOSPITAL_COMMUNITY)
Admission: RE | Admit: 2015-11-16 | Discharge: 2015-11-16 | Disposition: A | Discharge status: Home / Self Care | Payer: 59 | Source: Ambulatory Visit | Attending: Orthopaedic Surgery | Admitting: Orthopaedic Surgery

## 2015-11-16 ENCOUNTER — Encounter (HOSPITAL_COMMUNITY)
Admission: RE | Disposition: A | Discharge status: Home / Self Care | Payer: Self-pay | Source: Ambulatory Visit | Attending: Orthopaedic Surgery

## 2015-11-16 ENCOUNTER — Ambulatory Visit (HOSPITAL_COMMUNITY): Payer: 59 | Admitting: Vascular Surgery

## 2015-11-16 ENCOUNTER — Ambulatory Visit (HOSPITAL_COMMUNITY): Payer: 59 | Admitting: Anesthesiology

## 2015-11-16 ENCOUNTER — Other Ambulatory Visit: Payer: 59

## 2015-11-16 ENCOUNTER — Encounter (HOSPITAL_COMMUNITY): Payer: Self-pay | Admitting: General Practice

## 2015-11-16 DIAGNOSIS — I10 Essential (primary) hypertension: Secondary | ICD-10-CM | POA: Insufficient documentation

## 2015-11-16 DIAGNOSIS — G8918 Other acute postprocedural pain: Secondary | ICD-10-CM | POA: Diagnosis not present

## 2015-11-16 DIAGNOSIS — M7501 Adhesive capsulitis of right shoulder: Secondary | ICD-10-CM | POA: Insufficient documentation

## 2015-11-16 DIAGNOSIS — G709 Myoneural disorder, unspecified: Secondary | ICD-10-CM | POA: Diagnosis not present

## 2015-11-16 DIAGNOSIS — M1990 Unspecified osteoarthritis, unspecified site: Secondary | ICD-10-CM | POA: Diagnosis not present

## 2015-11-16 DIAGNOSIS — Z87891 Personal history of nicotine dependence: Secondary | ICD-10-CM | POA: Diagnosis not present

## 2015-11-16 DIAGNOSIS — G4730 Sleep apnea, unspecified: Secondary | ICD-10-CM | POA: Diagnosis not present

## 2015-11-16 DIAGNOSIS — J45909 Unspecified asthma, uncomplicated: Secondary | ICD-10-CM | POA: Diagnosis not present

## 2015-11-16 DIAGNOSIS — M7541 Impingement syndrome of right shoulder: Secondary | ICD-10-CM | POA: Insufficient documentation

## 2015-11-16 DIAGNOSIS — Z9981 Dependence on supplemental oxygen: Secondary | ICD-10-CM | POA: Insufficient documentation

## 2015-11-16 DIAGNOSIS — D539 Nutritional anemia, unspecified: Secondary | ICD-10-CM | POA: Diagnosis not present

## 2015-11-16 DIAGNOSIS — K219 Gastro-esophageal reflux disease without esophagitis: Secondary | ICD-10-CM | POA: Insufficient documentation

## 2015-11-16 DIAGNOSIS — M75101 Unspecified rotator cuff tear or rupture of right shoulder, not specified as traumatic: Secondary | ICD-10-CM | POA: Diagnosis not present

## 2015-11-16 DIAGNOSIS — M75111 Incomplete rotator cuff tear or rupture of right shoulder, not specified as traumatic: Secondary | ICD-10-CM | POA: Diagnosis not present

## 2015-11-16 DIAGNOSIS — Z853 Personal history of malignant neoplasm of breast: Secondary | ICD-10-CM | POA: Insufficient documentation

## 2015-11-16 HISTORY — PX: SHOULDER ARTHROSCOPY: SHX128

## 2015-11-16 SURGERY — ARTHROSCOPY, SHOULDER
Anesthesia: Monitor Anesthesia Care | Site: Shoulder | Laterality: Right

## 2015-11-16 MED ORDER — PROPOFOL 10 MG/ML IV BOLUS
INTRAVENOUS | Status: DC | PRN
  Administered 2015-11-16: 200 mg via INTRAVENOUS

## 2015-11-16 MED ORDER — FENTANYL CITRATE (PF) 250 MCG/5ML IJ SOLN
INTRAMUSCULAR | Status: AC
  Filled 2015-11-16: qty 5

## 2015-11-16 MED ORDER — LIDOCAINE HCL (CARDIAC) 20 MG/ML IV SOLN
INTRAVENOUS | Status: AC
  Filled 2015-11-16: qty 5

## 2015-11-16 MED ORDER — BUPIVACAINE HCL (PF) 0.25 % IJ SOLN
INTRAMUSCULAR | Status: AC
  Filled 2015-11-16: qty 30

## 2015-11-16 MED ORDER — SODIUM CHLORIDE 0.9 % IR SOLN
Status: DC | PRN
  Administered 2015-11-16: 3000 mL

## 2015-11-16 MED ORDER — BUPIVACAINE HCL (PF) 0.25 % IJ SOLN
INTRAMUSCULAR | Status: DC | PRN
  Administered 2015-11-16: 5 mL

## 2015-11-16 MED ORDER — ARTIFICIAL TEARS OP OINT
TOPICAL_OINTMENT | OPHTHALMIC | Status: DC | PRN
  Administered 2015-11-16: 1 via OPHTHALMIC

## 2015-11-16 MED ORDER — PROPOFOL 10 MG/ML IV BOLUS
INTRAVENOUS | Status: AC
  Filled 2015-11-16: qty 20

## 2015-11-16 MED ORDER — EPHEDRINE SULFATE 50 MG/ML IJ SOLN
INTRAMUSCULAR | Status: AC
  Filled 2015-11-16: qty 1

## 2015-11-16 MED ORDER — LACTATED RINGERS IV SOLN
INTRAVENOUS | Status: DC | PRN
  Administered 2015-11-16: 07:00:00 via INTRAVENOUS

## 2015-11-16 MED ORDER — MIDAZOLAM HCL 2 MG/2ML IJ SOLN
INTRAMUSCULAR | Status: AC
  Filled 2015-11-16: qty 2

## 2015-11-16 MED ORDER — SODIUM CHLORIDE 0.9 % IJ SOLN
INTRAMUSCULAR | Status: AC
  Filled 2015-11-16: qty 10

## 2015-11-16 MED ORDER — ROCURONIUM BROMIDE 50 MG/5ML IV SOLN
INTRAVENOUS | Status: AC
  Filled 2015-11-16: qty 1

## 2015-11-16 MED ORDER — PROMETHAZINE HCL 25 MG/ML IJ SOLN: 6.2500 mg | INTRAMUSCULAR | Status: DC | PRN

## 2015-11-16 MED ORDER — HYDROMORPHONE HCL 1 MG/ML IJ SOLN: 0.2500 mg | INTRAMUSCULAR | Status: DC | PRN

## 2015-11-16 MED ORDER — BUPIVACAINE-EPINEPHRINE (PF) 0.25% -1:200000 IJ SOLN
INTRAMUSCULAR | Status: AC
  Filled 2015-11-16: qty 30

## 2015-11-16 MED ORDER — 0.9 % SODIUM CHLORIDE (POUR BTL) OPTIME
TOPICAL | Status: DC | PRN
  Administered 2015-11-16: 1000 mL

## 2015-11-16 MED ORDER — SUCCINYLCHOLINE CHLORIDE 20 MG/ML IJ SOLN
INTRAMUSCULAR | Status: AC
  Filled 2015-11-16: qty 1

## 2015-11-16 MED ORDER — SUCCINYLCHOLINE CHLORIDE 20 MG/ML IJ SOLN
INTRAMUSCULAR | Status: DC | PRN
  Administered 2015-11-16: 100 mg via INTRAVENOUS

## 2015-11-16 MED ORDER — ROPIVACAINE HCL 5 MG/ML IJ SOLN
INTRAMUSCULAR | Status: DC | PRN
  Administered 2015-11-16: 25 mL via PERINEURAL

## 2015-11-16 MED ORDER — FENTANYL CITRATE (PF) 100 MCG/2ML IJ SOLN
INTRAMUSCULAR | Status: DC | PRN
  Administered 2015-11-16: 100 ug via INTRAVENOUS

## 2015-11-16 MED ORDER — PHENYLEPHRINE HCL 10 MG/ML IJ SOLN
10.0000 mg | INTRAVENOUS | Status: DC | PRN
  Administered 2015-11-16: 20 ug/min via INTRAVENOUS

## 2015-11-16 MED ORDER — LIDOCAINE HCL (CARDIAC) 20 MG/ML IV SOLN
INTRAVENOUS | Status: DC | PRN
  Administered 2015-11-16: 60 mg via INTRAVENOUS

## 2015-11-16 MED ORDER — HYDROCODONE-ACETAMINOPHEN 5-325 MG PO TABS
1.0000 | ORAL_TABLET | ORAL | Status: DC | PRN
Start: 2015-11-16 — End: 2016-03-13

## 2015-11-16 MED FILL — HYDROCODON-APAP 5-325: 5-325 | 3 days supply | Qty: 40 | Fill #0

## 2015-11-16 SURGICAL SUPPLY — 52 items
BLADE GREAT WHITE 4.2 (BLADE) ×2 IMPLANT
BUR VERTEX HOODED 4.5 (BURR) ×2 IMPLANT
CANNULA SHOULDER 7CM (CANNULA) ×2 IMPLANT
CANNULA TWIST IN 8.25X7CM (CANNULA) IMPLANT
COVER SURGICAL LIGHT HANDLE (MISCELLANEOUS) ×2 IMPLANT
DRAPE IMP U-DRAPE 54X76 (DRAPES) ×2 IMPLANT
DRAPE ORTHO SPLIT 77X108 STRL (DRAPES) ×4
DRAPE STERI 35X30 U-POUCH (DRAPES) ×2 IMPLANT
DRAPE SURG ORHT 6 SPLT 77X108 (DRAPES) ×2 IMPLANT
DRAPE U-SHAPE 47X51 STRL (DRAPES) ×2 IMPLANT
DRSG EMULSION OIL 3X3 NADH (GAUZE/BANDAGES/DRESSINGS) ×1 IMPLANT
DRSG PAD ABDOMINAL 8X10 ST (GAUZE/BANDAGES/DRESSINGS) ×2 IMPLANT
DURAPREP 26ML APPLICATOR (WOUND CARE) ×2 IMPLANT
ELECT MENISCUS 165MM 90D (ELECTRODE) IMPLANT
ELECT REM PT RETURN 9FT ADLT (ELECTROSURGICAL)
ELECTRODE REM PT RTRN 9FT ADLT (ELECTROSURGICAL) IMPLANT
GAUZE SPONGE 4X4 12PLY STRL (GAUZE/BANDAGES/DRESSINGS) ×1 IMPLANT
GLOVE BIO SURGEON STRL SZ8 (GLOVE) ×6 IMPLANT
GLOVE BIOGEL PI IND STRL 8 (GLOVE) ×1 IMPLANT
GLOVE BIOGEL PI INDICATOR 8 (GLOVE) ×2
GOWN STRL REUS W/ TWL LRG LVL3 (GOWN DISPOSABLE) IMPLANT
GOWN STRL REUS W/ TWL XL LVL3 (GOWN DISPOSABLE) ×3 IMPLANT
GOWN STRL REUS W/TWL 2XL LVL3 (GOWN DISPOSABLE) ×2 IMPLANT
GOWN STRL REUS W/TWL LRG LVL3 (GOWN DISPOSABLE) ×4
GOWN STRL REUS W/TWL XL LVL3 (GOWN DISPOSABLE) ×2
KIT BASIN OR (CUSTOM PROCEDURE TRAY) ×2 IMPLANT
KIT ROOM TURNOVER OR (KITS) ×2 IMPLANT
MANIFOLD NEPTUNE II (INSTRUMENTS) ×1 IMPLANT
NDL SCORPION (NEEDLE) IMPLANT
NEEDLE 22X1 1/2 (OR ONLY) (NEEDLE) ×1 IMPLANT
NEEDLE SCORPION (NEEDLE) IMPLANT
NS IRRIG 1000ML POUR BTL (IV SOLUTION) ×2 IMPLANT
PACK ARTHROSCOPY DSU (CUSTOM PROCEDURE TRAY) ×2 IMPLANT
PACK UNIVERSAL I (CUSTOM PROCEDURE TRAY) ×1 IMPLANT
PAD ARMBOARD 7.5X6 YLW CONV (MISCELLANEOUS) ×4 IMPLANT
PENCIL BUTTON HOLSTER BLD 10FT (ELECTRODE) IMPLANT
SET ARTHROSCOPY TUBING (MISCELLANEOUS) ×4
SET ARTHROSCOPY TUBING LN (MISCELLANEOUS) ×1 IMPLANT
SLING ARM FOAM STRAP LRG (SOFTGOODS) ×1 IMPLANT
SLING ARM LRG ADULT FOAM STRAP (SOFTGOODS) ×1 IMPLANT
SPONGE GAUZE 4X4 12PLY STER LF (GAUZE/BANDAGES/DRESSINGS) ×1 IMPLANT
SPONGE LAP 4X18 X RAY DECT (DISPOSABLE) ×2 IMPLANT
SUT ETHILON 4 0 PS 2 18 (SUTURE) ×2 IMPLANT
SUT FIBERWIRE #2 38 T-5 BLUE (SUTURE)
SUTURE FIBERWR #2 38 T-5 BLUE (SUTURE) IMPLANT
SYR CONTROL 10ML LL (SYRINGE) IMPLANT
TAPE CLOTH SURG 6X10 WHT LF (GAUZE/BANDAGES/DRESSINGS) ×1 IMPLANT
TOWEL OR 17X24 6PK STRL BLUE (TOWEL DISPOSABLE) ×2 IMPLANT
TOWEL OR 17X26 10 PK STRL BLUE (TOWEL DISPOSABLE) ×2 IMPLANT
TUBE CONNECTING 12X1/4 (SUCTIONS) ×2 IMPLANT
WAND HAND CNTRL MULTIVAC 90 (MISCELLANEOUS) ×2 IMPLANT
WATER STERILE IRR 1000ML POUR (IV SOLUTION) ×2 IMPLANT

## 2015-11-16 NOTE — Op Note (Signed)
#  [id number] 

## 2015-11-16 NOTE — Progress Notes (Signed)
Patient's mom and sister at bedside. Patient still wants to go home and family have decided that it is better to go home when patient is more awake. Rx was sent with Sister Bianca Kent to go get filled per family request.

## 2015-11-16 NOTE — Anesthesia Procedure Notes (Addendum)
Anesthesia Regional Block:  Interscalene brachial plexus block  Pre-Anesthetic Checklist: ,, timeout performed, Correct Patient, Correct Site, Correct Laterality, Correct Procedure, Correct Position, site marked, Risks and benefits discussed,  Surgical consent,  Pre-op evaluation,  At surgeon's request and post-op pain management  Laterality: Upper and Right  Prep: chloraprep and alcohol swabs       Needles:  Injection technique: Single-shot  Needle Type: Echogenic Stimulator Needle     Needle Length: 4cm 4 cm Needle Gauge: 22 and 22 G  Needle insertion depth: 3 cm   Additional Needles:  Procedures: ultrasound guided (picture in chart) and nerve stimulator Interscalene brachial plexus block  Nerve Stimulator or Paresthesia:  Response: Twitch elicited, 0.5 mA, 0.3 ms,   Additional Responses:   Narrative:  Start time: 11/16/2015 7:00 AM End time: 11/16/2015 7:15 AM Injection made incrementally with aspirations every 5 mL.  Performed by: Personally  Anesthesiologist: MASSAGEE, TERRY  Additional Notes: Block assessed prior to start of surgery   Procedure Name: Intubation Date/Time: 11/16/2015 7:41 AM Performed by: Izora Gala Pre-anesthesia Checklist: Patient identified, Emergency Drugs available, Suction available and Patient being monitored Patient Re-evaluated:Patient Re-evaluated prior to inductionOxygen Delivery Method: Circle system utilized Preoxygenation: Pre-oxygenation with 100% oxygen Intubation Type: IV induction Ventilation: Mask ventilation without difficulty Laryngoscope Size: Miller and 3 Grade View: Grade II Tube type: Oral Tube size: 7.0 mm Number of attempts: 1 Airway Equipment and Method: Stylet and LTA kit utilized Placement Confirmation: ETT inserted through vocal cords under direct vision,  positive ETCO2 and breath sounds checked- equal and bilateral Secured at: 22 cm Tube secured with: Tape Dental Injury: Teeth and Oropharynx as per  pre-operative assessment

## 2015-11-16 NOTE — Op Note (Signed)
NAMELAJEUNE, MARCHEL               ACCOUNT NO.:  [id number]  MEDICAL RECORD NO.:  [id number]  LOCATION:  [facility]                         FACILITY:  [facility]  PHYSICIAN:  Monico Blitz. Norvella Loscalzo, M.D.DATE OF BIRTH:  February 08, 1966  DATE OF PROCEDURE:  11/16/2015 DATE OF DISCHARGE:                              OPERATIVE REPORT   PREOPERATIVE DIAGNOSES: 1. Right shoulder impingement. 2. Right shoulder partial thickness rotator cuff tear.  POSTOPERATIVE DIAGNOSES: 1. Right shoulder impingement. 2. Right shoulder partial thickness rotator cuff tear.  PROCEDURES: 1. Right shoulder arthroscopic acromioplasty. 2. Right shoulder arthroscopic debridement.  ANESTHESIA:  General and block.  ATTENDING SURGEON:  Monico Blitz. Rhona Raider, M.D.  ASSISTANT:  Loni Dolly, PA  INDICATION FOR PROCEDURE:  The patient is a 50 year old woman with many months of terrible right shoulder pain.  This has persisted despite conservative measures.  By MRI scan, she has a bursal aspect partial thickness cuff tearing with significant impingement.  She has pain, which is disabling to her.  She is offered an arthroscopy.  Informed operative consent was obtained after discussion of possible complications including reaction to anesthesia, infection, need for prolonged intubation, and even death.  The case was discussed with her oncologist extensively prior to the surgery and she also received an anesthesia consultation due to the complex nature of her medical situation.  SUMMARY OF FINDINGS AND PROCEDURE:  Under general anesthesia and block and some local anesthetic, we performed a right shoulder arthroscopy. Glenohumeral joint showed no degenerative changes and the biceps tendon looked normal.  The rotator cuff looked benign from below.  In the subacromial space, she had some irritation of her rotator cuff, but no tear worthy of repair.  She had a moderately prominent subacromial morphology addressed with an acromioplasty back  to a flat surface.  She was closed primarily.  DESCRIPTION OF PROCEDURE:  The patient was taken to the operating suite where general anesthetic was applied without difficulty.  She was given a block in the pre-anesthesia area and also given some supplemental local anesthesia on the posterior aspect of the shoulder in the area of our portal.  She was prepped and draped in normal sterile fashion. After the administration of preop IV Kefzol and an appropriate time-out, an arthroscopy of the right shoulder was performed through a total of 2 portals.  Findings were as noted above and procedure consisted most significantly of the acromioplasty, which was done with a burr in the lateral position followed by transfer of the burr in the posterior position.  We also performed the debridement of the bursal aspect of the cuff, but no tear worthy of repair was found.  The shoulder was thoroughly irrigated followed by removal of arthroscopic equipment.  We reapproximated the portals loosely with nylon followed by Adaptic, dry gauze, and tape.  Estimated blood loss and fluids obtained from anesthesia records.  DISPOSITION:  The patient was scheduled to go to recovery, intubated for gradual extubation, and this management will be left up to anesthesia. She may stay overnight for observation again up to the opinion of the anesthesiologist managing her care.     Monico Blitz Rhona Raider, M.D.     PGD/MEDQ  D:  11/16/2015  T:  11/16/2015  Job:  [id number]

## 2015-11-16 NOTE — Anesthesia Postprocedure Evaluation (Signed)
Anesthesia Post Note  Patient: Bianca Kent  Procedure(s) Performed: Procedure(s) (LRB): ARTHROSCOPY SHOULDER, Accromioplasty, and debridement (Right)  Patient location during evaluation: PACU Anesthesia Type: General and Regional Level of consciousness: awake and alert Pain management: pain level controlled Vital Signs Assessment: post-procedure vital signs reviewed and stable Respiratory status: spontaneous breathing, nonlabored ventilation, respiratory function stable and patient connected to nasal cannula oxygen Cardiovascular status: blood pressure returned to baseline and stable Postop Assessment: no signs of nausea or vomiting Anesthetic complications: no    Last Vitals:  Filed Vitals:   11/16/15 1030 11/16/15 1045  BP: 102/65 110/70  Pulse: 82 83  Temp:    Resp: 26 36    Last Pain:  Filed Vitals:   11/16/15 1049  PainSc: 0-No pain                 Cattaleya Wien,JAMES TERRILL

## 2015-11-16 NOTE — Transfer of Care (Signed)
Immediate Anesthesia Transfer of Care Note  Patient: Bianca Kent  Procedure(s) Performed: Procedure(s): ARTHROSCOPY SHOULDER, Accromioplasty, and debridement (Right)  Patient Location: PACU  Anesthesia Type:General  Level of Consciousness: awake  Airway & Oxygen Therapy: Patient Spontanous Breathing and Patient connected to face mask oxygen  Post-op Assessment: Report given to RN, Post -op Vital signs reviewed and stable and Patient moving all extremities  Post vital signs: Reviewed and stable  Last Vitals:  Filed Vitals:   11/16/15 0638 11/16/15 0640  BP: 132/71   Pulse: 83   Temp:  36.6 C  Resp: 16     Complications: No apparent anesthesia complications

## 2015-11-16 NOTE — Interval H&P Note (Signed)
OK for surgery PD 

## 2015-11-17 ENCOUNTER — Encounter (HOSPITAL_COMMUNITY): Payer: Self-pay | Admitting: Orthopaedic Surgery

## 2015-11-19 ENCOUNTER — Ambulatory Visit: Payer: 59 | Admitting: Hematology & Oncology

## 2015-11-19 ENCOUNTER — Other Ambulatory Visit: Payer: 59

## 2015-11-19 ENCOUNTER — Ambulatory Visit: Payer: 59

## 2015-11-22 DIAGNOSIS — J9601 Acute respiratory failure with hypoxia: Secondary | ICD-10-CM | POA: Diagnosis not present

## 2015-11-24 DIAGNOSIS — Z9889 Other specified postprocedural states: Secondary | ICD-10-CM | POA: Diagnosis not present

## 2015-11-30 DIAGNOSIS — M25511 Pain in right shoulder: Secondary | ICD-10-CM | POA: Diagnosis not present

## 2015-12-01 ENCOUNTER — Ambulatory Visit: Payer: 59 | Admitting: Hematology & Oncology

## 2015-12-01 ENCOUNTER — Other Ambulatory Visit: Payer: 59

## 2015-12-01 ENCOUNTER — Ambulatory Visit: Payer: 59

## 2015-12-01 DIAGNOSIS — M25511 Pain in right shoulder: Secondary | ICD-10-CM | POA: Diagnosis not present

## 2015-12-02 ENCOUNTER — Ambulatory Visit: Payer: 59 | Admitting: Hematology & Oncology

## 2015-12-02 ENCOUNTER — Other Ambulatory Visit: Payer: Self-pay | Admitting: *Deleted

## 2015-12-02 ENCOUNTER — Ambulatory Visit: Payer: 59

## 2015-12-02 ENCOUNTER — Ambulatory Visit (HOSPITAL_BASED_OUTPATIENT_CLINIC_OR_DEPARTMENT_OTHER): Payer: 59 | Admitting: Hematology & Oncology

## 2015-12-02 ENCOUNTER — Other Ambulatory Visit: Payer: 59

## 2015-12-02 ENCOUNTER — Encounter: Payer: Self-pay | Admitting: Hematology & Oncology

## 2015-12-02 ENCOUNTER — Other Ambulatory Visit (HOSPITAL_BASED_OUTPATIENT_CLINIC_OR_DEPARTMENT_OTHER): Payer: 59

## 2015-12-02 ENCOUNTER — Ambulatory Visit (HOSPITAL_BASED_OUTPATIENT_CLINIC_OR_DEPARTMENT_OTHER): Payer: 59

## 2015-12-02 VITALS — BP 106/67 | HR 99 | Temp 97.7°F | Resp 18 | Ht 67.0 in | Wt 184.0 lb

## 2015-12-02 DIAGNOSIS — C50912 Malignant neoplasm of unspecified site of left female breast: Secondary | ICD-10-CM

## 2015-12-02 DIAGNOSIS — C7931 Secondary malignant neoplasm of brain: Secondary | ICD-10-CM

## 2015-12-02 DIAGNOSIS — R112 Nausea with vomiting, unspecified: Secondary | ICD-10-CM

## 2015-12-02 DIAGNOSIS — R7989 Other specified abnormal findings of blood chemistry: Secondary | ICD-10-CM | POA: Diagnosis not present

## 2015-12-02 DIAGNOSIS — R5383 Other fatigue: Secondary | ICD-10-CM

## 2015-12-02 DIAGNOSIS — R110 Nausea: Secondary | ICD-10-CM

## 2015-12-02 DIAGNOSIS — K909 Intestinal malabsorption, unspecified: Secondary | ICD-10-CM

## 2015-12-02 DIAGNOSIS — C50919 Malignant neoplasm of unspecified site of unspecified female breast: Secondary | ICD-10-CM

## 2015-12-02 DIAGNOSIS — Z5112 Encounter for antineoplastic immunotherapy: Secondary | ICD-10-CM

## 2015-12-02 DIAGNOSIS — C50911 Malignant neoplasm of unspecified site of right female breast: Secondary | ICD-10-CM

## 2015-12-02 DIAGNOSIS — R531 Weakness: Secondary | ICD-10-CM | POA: Diagnosis not present

## 2015-12-02 DIAGNOSIS — R64 Cachexia: Secondary | ICD-10-CM

## 2015-12-02 DIAGNOSIS — G4701 Insomnia due to medical condition: Secondary | ICD-10-CM

## 2015-12-02 DIAGNOSIS — Z170 Estrogen receptor positive status [ER+]: Secondary | ICD-10-CM

## 2015-12-02 DIAGNOSIS — D508 Other iron deficiency anemias: Secondary | ICD-10-CM

## 2015-12-02 LAB — CMP (CANCER CENTER ONLY)
ALBUMIN: 3.7 g/dL (ref 3.3–5.5)
ALK PHOS: 123 U/L — AB (ref 26–84)
ALT: 31 U/L (ref 10–47)
AST: 45 U/L — AB (ref 11–38)
BILIRUBIN TOTAL: 0.6 mg/dL (ref 0.20–1.60)
BUN, Bld: 10 mg/dL (ref 7–22)
CALCIUM: 8.2 mg/dL (ref 8.0–10.3)
CO2: 27 mEq/L (ref 18–33)
CREATININE: 0.9 mg/dL (ref 0.6–1.2)
Chloride: 107 mEq/L (ref 98–108)
GLUCOSE: 92 mg/dL (ref 73–118)
Potassium: 4.3 mEq/L (ref 3.3–4.7)
SODIUM: 138 meq/L (ref 128–145)
Total Protein: 7.6 g/dL (ref 6.4–8.1)

## 2015-12-02 LAB — CBC WITH DIFFERENTIAL (CANCER CENTER ONLY)
BASO#: 0 10*3/uL (ref 0.0–0.2)
BASO%: 0.4 % (ref 0.0–2.0)
EOS%: 2.6 % (ref 0.0–7.0)
Eosinophils Absolute: 0.1 10*3/uL (ref 0.0–0.5)
HEMATOCRIT: 40.6 % (ref 34.8–46.6)
HEMOGLOBIN: 13.3 g/dL (ref 11.6–15.9)
LYMPH#: 0.8 10*3/uL — ABNORMAL LOW (ref 0.9–3.3)
LYMPH%: 17.9 % (ref 14.0–48.0)
MCH: 29.6 pg (ref 26.0–34.0)
MCHC: 32.8 g/dL (ref 32.0–36.0)
MCV: 90 fL (ref 81–101)
MONO#: 0.5 10*3/uL (ref 0.1–0.9)
MONO%: 10.9 % (ref 0.0–13.0)
NEUT#: 3.2 10*3/uL (ref 1.5–6.5)
NEUT%: 68.2 % (ref 39.6–80.0)
Platelets: 121 10*3/uL — ABNORMAL LOW (ref 145–400)
RBC: 4.5 10*6/uL (ref 3.70–5.32)
RDW: 15.2 % (ref 11.1–15.7)
WBC: 4.7 10*3/uL (ref 3.9–10.0)

## 2015-12-02 LAB — FERRITIN: Ferritin: 259 ng/ml (ref 9–269)

## 2015-12-02 LAB — IRON AND TIBC
%SAT: 47 % (ref 21–57)
Iron: 115 ug/dL (ref 41–142)
TIBC: 245 ug/dL (ref 236–444)
UIBC: 130 ug/dL (ref 120–384)

## 2015-12-02 MED ORDER — SODIUM CHLORIDE 0.9 % IV SOLN
Freq: Once | INTRAVENOUS | Status: AC
  Administered 2015-12-02: 14:00:00 via INTRAVENOUS
  Filled 2015-12-02: qty 4

## 2015-12-02 MED ORDER — TRASTUZUMAB CHEMO INJECTION 440 MG
483.0000 mg | Freq: Once | INTRAVENOUS | Status: AC
  Administered 2015-12-02: 483 mg via INTRAVENOUS
  Filled 2015-12-02: qty 23

## 2015-12-02 MED ORDER — ZOLEDRONIC ACID 4 MG/100ML IV SOLN
4.0000 mg | Freq: Once | INTRAVENOUS | Status: AC
  Administered 2015-12-02: 4 mg via INTRAVENOUS
  Filled 2015-12-02: qty 100

## 2015-12-02 MED ORDER — SODIUM CHLORIDE 0.9 % IV SOLN
420.0000 mg | Freq: Once | INTRAVENOUS | Status: AC
  Administered 2015-12-02: 420 mg via INTRAVENOUS
  Filled 2015-12-02: qty 14

## 2015-12-02 MED ORDER — DIPHENHYDRAMINE HCL 25 MG PO CAPS
50.0000 mg | ORAL_CAPSULE | Freq: Once | ORAL | Status: AC
  Administered 2015-12-02: 50 mg via ORAL

## 2015-12-02 MED ORDER — ACETAMINOPHEN 325 MG PO TABS
650.0000 mg | ORAL_TABLET | Freq: Once | ORAL | Status: AC
  Administered 2015-12-02: 650 mg via ORAL

## 2015-12-02 MED ORDER — SODIUM CHLORIDE 0.9 % IV SOLN
Freq: Once | INTRAVENOUS | Status: AC
  Administered 2015-12-02: 12:00:00 via INTRAVENOUS

## 2015-12-02 MED ORDER — ONDANSETRON HCL 4 MG/2ML IJ SOLN: 8.0000 mg | Freq: Three times a day (TID) | INTRAMUSCULAR | Status: DC | PRN

## 2015-12-02 MED ORDER — SODIUM CHLORIDE 0.9 % IJ SOLN
10.0000 mL | INTRAMUSCULAR | Status: DC | PRN
  Administered 2015-12-02: 10 mL
  Filled 2015-12-02: qty 10

## 2015-12-02 MED ORDER — DIPHENHYDRAMINE HCL 25 MG PO CAPS
ORAL_CAPSULE | ORAL | Status: AC
  Filled 2015-12-02: qty 2

## 2015-12-02 MED ORDER — DRONABINOL 10 MG PO CAPS: 10.0000 mg | ORAL_CAPSULE | Freq: Three times a day (TID) | ORAL | Status: AC

## 2015-12-02 MED ORDER — HEPARIN SOD (PORK) LOCK FLUSH 100 UNIT/ML IV SOLN
500.0000 [IU] | Freq: Once | INTRAVENOUS | Status: AC | PRN
  Administered 2015-12-02: 500 [IU]
  Filled 2015-12-02: qty 5

## 2015-12-02 MED ORDER — ACETAMINOPHEN 325 MG PO TABS
ORAL_TABLET | ORAL | Status: AC
  Filled 2015-12-02: qty 2

## 2015-12-02 MED FILL — HYDROCODON-APAP 5-325: 5-325 | 10 days supply | Qty: 40 | Fill #0

## 2015-12-02 MED FILL — DRONABINOL 5 MG CAPSULE: 5 | 30 days supply | Qty: 180 | Fill #0

## 2015-12-02 NOTE — Addendum Note (Signed)
Addended by: Burney Gauze R on: 12/02/2015 12:57 PM   Modules accepted: Orders

## 2015-12-02 NOTE — Patient Instructions (Signed)
[facility] Discharge Instructions for Patients Receiving Chemotherapy  Today you received the following chemotherapy agents Herceptin, Perjeta.  To help prevent nausea and vomiting after your treatment, we encourage you to take your nausea medication.   If you develop nausea and vomiting that is not controlled by your nausea medication, call the clinic.   BELOW ARE SYMPTOMS THAT SHOULD BE REPORTED IMMEDIATELY:  *FEVER GREATER THAN 100.5 F  *CHILLS WITH OR WITHOUT FEVER  NAUSEA AND VOMITING THAT IS NOT CONTROLLED WITH YOUR NAUSEA MEDICATION  *UNUSUAL SHORTNESS OF BREATH  *UNUSUAL BRUISING OR BLEEDING  TENDERNESS IN MOUTH AND THROAT WITH OR WITHOUT PRESENCE OF ULCERS  *URINARY PROBLEMS  *BOWEL PROBLEMS  UNUSUAL RASH Items with * indicate a potential emergency and should be followed up as soon as possible.  Feel free to call the clinic you have any questions or concerns. The clinic phone number is [phone].  Please show the CHEMO ALERT CARD at check-in to the Emergency Department and triage nurse.   

## 2015-12-02 NOTE — Progress Notes (Signed)
Hematology and Oncology Follow Up Visit  ELYSSE Kent [id number] 01/30/1966 50 y.o. 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 is back for followup . She looks fantastic. She had her right shoulder surgery a week or so ago. She got through this without any pulmonary problems. She had no issues with respect to her hemidiaphragm. The anesthesiologist were incredibly dedicated and very thorough and making sure that everything went well.  She is doing physical therapy for her shoulder. She is doing well with this. She will has to wear a sling when she is out in public.  She's had a good appetite. She's had no problems with diarrhea.  She will start her Xeloda again this week. She's been off it now for a couple months.  Her last CA 27.29 was 20.  She has had no leg or arm swelling.  She has had good pain control.  She's had no headache. There's been no visual problems.   Overall, her performance status is ECOG 1. .  Medications:  Current outpatient prescriptions:  .  albuterol (PROVENTIL HFA;VENTOLIN HFA) 108 (90 BASE) MCG/ACT inhaler, Inhale 2 puffs into the lungs every 4 (four) hours as needed for wheezing or shortness of breath., Disp: 2 Inhaler, Rfl: 6 .  ALPRAZolam (XANAX) 1 MG tablet, Take 1 tablet (1 mg total) by mouth at bedtime as needed for anxiety., Disp: 30 tablet, Rfl: 2 .  aspirin (ASPIRIN EC) 81 MG EC tablet, Take 81 mg by mouth daily. Swallow whole., Disp: , Rfl:  .  Bismuth Subsalicylate (PEPTO-BISMOL PO), Take 1 tablet by mouth as needed (upset stomach). , Disp: , Rfl:  .  calcium carbonate (TUMS - DOSED IN MG ELEMENTAL CALCIUM) 500  MG chewable tablet, Chew 2 tablets by mouth 2 (two) times daily., Disp: , Rfl:  .  capecitabine (XELODA) 500 MG tablet, Take 4 tablets with food twice a day for 14 days, Disp: 112 tablet, Rfl: 3 .  diphenhydrAMINE (SOMINEX) 25 MG tablet, Take 100 mg by mouth at bedtime as needed for sleep. Pt states she is taking up to 200 mg Benadryl at night and still not sleeping, Disp: , Rfl:  .  docusate sodium (COLACE) 100 MG capsule, Take 1 capsule (100 mg total) by mouth 2 (two) times daily as needed for mild constipation or moderate constipation., Disp: 10 capsule, Rfl: 0 .  ergocalciferol (VITAMIN D2) 50000 UNITS capsule, Take 1 capsule (50,000 Units total) by mouth 2 (two) times a week., Disp: 8 capsule, Rfl: 6 .  fentaNYL (DURAGESIC - DOSED MCG/HR) 50 MCG/HR, Place 1 patch (50 mcg total) onto the skin every other day., Disp: 15 patch, Rfl: 0 .  HYDROcodone-acetaminophen (NORCO) 5-325 MG tablet, Take 1-2 tablets by mouth every 4 (four) hours as needed for moderate pain or severe pain., Disp: 40 tablet, Rfl: 0 .  HYDROmorphone (DILAUDID) 4 MG tablet, Take 1 tablet (4 mg total) by mouth every 4 (four) hours as needed for severe pain., Disp: 90 tablet, Rfl: 0 .  Lactulose SOLN, Take 2 TBLSP every 6 hours until bowel movement (Patient taking differently: Take 30 mLs by  mouth every 6 (six) hours as needed. Take 2 TBLSP every 6 hours until bowel movement), Disp: 1000 mL, Rfl: 4 .  lidocaine (LIDODERM) 5 %, Place 1 patch onto the skin daily. Remove & Discard patch within 12 hours or as directed by MD, Disp: 20 patch, Rfl: 0 .  lidocaine (XYLOCAINE) 2 % solution, Use as directed 20 mLs in the mouth or throat as needed for mouth pain., Disp: 200 mL, Rfl: 2 .  lidocaine-prilocaine (EMLA) cream, Apply 1 application topically as needed., Disp: 30 g, Rfl: 6 .  loratadine-pseudoephedrine (CLARITIN-D 24-HOUR) 10-240 MG per 24 hr tablet, Take 1 tablet by mouth daily., Disp: , Rfl:  .  dronabinol (MARINOL) 10 MG capsule, Take  1 capsule (10 mg total) by mouth 3 (three) times daily before meals., Disp: 90 capsule, Rfl: 0 .  methylphenidate (RITALIN) 10 MG tablet, Take 1 tablet (10 mg total) by mouth 2 (two) times daily., Disp: 60 tablet, Rfl: 0 .  metoCLOPramide (REGLAN) 10 MG tablet, Take 1 tablet (10 mg total) by mouth every 6 (six) hours as needed for nausea., Disp: 120 tablet, Rfl: 8 .  Multiple Vitamin (MULTIVITAMIN) tablet, Take 1 tablet by mouth daily., Disp: , Rfl:  .  naloxegol oxalate (MOVANTIK) 25 MG TABS tablet, Take 1 tablet (25 mg total) by mouth daily., Disp: 30 tablet, Rfl: 6 .  NONFORMULARY OR COMPOUNDED ITEM, Take 5 mLs by mouth as needed (irritation). DUKES MOUTHWASH  SWISH AND SWALLOW, Disp: , Rfl:  .  OLANZapine (ZYPREXA) 5 MG tablet, Take 1 tablet (5 mg total) by mouth at bedtime., Disp: 30 tablet, Rfl: 3 .  ondansetron (ZOFRAN) 8 MG tablet, Take 1 tablet (8 mg total) by mouth every 8 (eight) hours as needed for nausea or vomiting., Disp: 60 tablet, Rfl: 2 .  pantoprazole (PROTONIX) 40 MG tablet, Take 1 tablet (40 mg total) by mouth daily., Disp: 30 tablet, Rfl: 6 .  pilocarpine (SALAGEN) 5 MG tablet, Take 1 tablet (5 mg total) by mouth 2 (two) times daily., Disp: 60 tablet, Rfl: 3 .  prochlorperazine (COMPAZINE) 10 MG tablet, Take 1 tablet (10 mg total) by mouth every 6 (six) hours as needed for nausea or vomiting., Disp: 60 tablet, Rfl: 2 .  promethazine (PHENERGAN) 25 MG suppository, Place 1 suppository (25 mg total) rectally every 6 (six) hours as needed for nausea or vomiting., Disp: 60 each, Rfl: 3 .  traZODone (DESYREL) 100 MG tablet, TAKE 1 TO 2 TABLETS BY MOUTH AS NEEDED FOR SLEEP, Disp: 30 tablet, Rfl: 2 No current facility-administered medications for this visit.  Facility-Administered Medications Ordered in Other Visits:  .  heparin lock flush 100 unit/mL, 500 Units, Intracatheter, Once PRN, [name], MD .  pertuzumab (PERJETA) 420 mg in sodium chloride 0.9 % 250 mL chemo infusion,  420 mg, Intravenous, Once, [name], MD .  sodium chloride 0.9 % injection 10 mL, 10 mL, Intracatheter, PRN, [name], MD .  trastuzumab (HERCEPTIN) 420 mg in sodium chloride 0.9 % 250 mL chemo infusion, 6 mg/kg (Treatment Plan Actual), Intravenous, Once, [name], MD  Allergies:  Allergies  Allergen Reactions  . Iohexol      Code: RASH, Desc: VERY STRONG FAMILY HX OF ANGIOEDEMA WHEN RECEIVING IV CONTRAST; PT HAS BEEN PREMEDICATED FOR OTHER CONTRASTED STUDIES(IN CATH. LAB)  KR, Onset Date: [id number]   . Prednisone Itching    Capillary beds bust  . Tetanus Toxoids Other (See Comments)    Ran a  high fever for 48 hours  . Theophyllines Hives    Mental changes  . Versed [Midazolam] Other (See Comments)    Pt becomes violent    Past Medical History, Surgical history, Social history, and Family History were reviewed and updated.  Review of Systems: As above  Physical Exam:  height is 5\' 7"  (1.702 m) and weight is 184 lb (83.462 kg). Her oral temperature is 97.7 F (36.5 C). Her blood pressure is 106/67 and her pulse is 99. Her respiration is 18 and oxygen saturation is 97%.   Well-developed and well-nourished white female. Head and exam shows no ocular or oral lesions. She's able to move both eyes well. She has good extraocular muscle . There is no erythema or exudate from the left eye. There might be some slight fullness in the right axilla by do not detect any obvious adenopathy. I cannot detect any obvious right supraclavicular lymph nodes. She has improved range of motion of the right shoulder. Left supraclavicular region is okay. No masses are noted. Lungs are clear. Cardiac exam regular rate and rhythm with no murmurs, rubs or bruits.. Abdomen is soft. She has good bowel sounds. There is no palpable liver or spleen tip. Back exam shows decreased tenderness over the lumbar and thoracic spine to palpation. Extremities shows no clubbing, cyanosis or edema. She has 4/5  strength in her legs. Skin exam shows improvement in the radiation dermatitis of the right breast and axilla. There is no open wound. There is some hyper pigmentation. Neurological exam is nonfocal.    Lab Results  Component Value Date   WBC 4.7 12/02/2015   HGB 13.3 12/02/2015   HCT 40.6 12/02/2015   MCV 90 12/02/2015   PLT 121* 12/02/2015     Chemistry      Component Value Date/Time   NA 138 12/02/2015 1044   NA 143 11/08/2015 1047   NA 138 04/30/2015 1019   K 4.3 12/02/2015 1044   K 4.0 11/08/2015 1047   K 3.5 04/30/2015 1019   CL 107 12/02/2015 1044   CL 110 11/08/2015 1047   CO2 27 12/02/2015 1044   CO2 24 11/08/2015 1047   CO2 29 04/30/2015 1019   BUN 10 12/02/2015 1044   BUN 6 11/08/2015 1047   BUN 9.7 04/30/2015 1019   CREATININE 0.9 12/02/2015 1044   CREATININE 0.78 11/08/2015 1047   CREATININE 0.8 04/30/2015 1019      Component Value Date/Time   CALCIUM 8.2 12/02/2015 1044   CALCIUM 9.4 11/08/2015 1047   CALCIUM 9.7 04/30/2015 1019   ALKPHOS 123* 12/02/2015 1044   ALKPHOS 257* 04/30/2015 1019   ALKPHOS 238* 03/06/2015 0425   AST 45* 12/02/2015 1044   AST 57* 04/30/2015 1019   AST 41 03/06/2015 0425   ALT 31 12/02/2015 1044   ALT 41 04/30/2015 1019   ALT 38 03/06/2015 0425   BILITOT 0.60 12/02/2015 1044   BILITOT 0.60 04/30/2015 1019   BILITOT 0.4 03/06/2015 0425         Impression and Plan: Bianca Kent is 50 year old white female with triple positive metastatic breast cancer.  She really looks good. She had to her shoulder surgery without any problems at all. I'm just very impressed with the outstanding care that we she received after her surgery.  She will restart the Xeloda. She did not Xeloda now for a couple of months. Her liver function tests have improved. I'm sure that the elevated LFTs were probably from Xeloda.  We  will go ahead and get an echocardiogram on her. I would not imagine that her heart should be doing okay since she had no  problems with her surgery.  As always, we had a very good prayer session.  I will plan to get her back in another 3 weeks.  I spent about 25 minutes with she and her sister. As always, we had a very good prayer session.    [name], MD 2/9/201712:25 PM

## 2015-12-02 NOTE — Addendum Note (Signed)
Addended by: Arbutus Ped on: 12/02/2015 01:13 PM   Modules accepted: Orders

## 2015-12-03 LAB — CANCER ANTIGEN 27.29: CAN 27.29: 24.2 U/mL (ref 0.0–38.6)

## 2015-12-03 LAB — CANCER ANTIGEN 27-29 (PARALLEL TESTING): CA 27.29: 25 U/mL (ref <38)

## 2015-12-03 LAB — RETICULOCYTES: Reticulocyte Count: 1.4 % (ref 0.6–2.6)

## 2015-12-06 ENCOUNTER — Ambulatory Visit (HOSPITAL_COMMUNITY)
Admission: RE | Admit: 2015-12-06 | Discharge: 2015-12-06 | Disposition: A | Discharge status: Home / Self Care | Payer: 59 | Source: Ambulatory Visit | Attending: Hematology & Oncology | Admitting: Hematology & Oncology

## 2015-12-06 DIAGNOSIS — I340 Nonrheumatic mitral (valve) insufficiency: Secondary | ICD-10-CM | POA: Diagnosis not present

## 2015-12-06 DIAGNOSIS — C7931 Secondary malignant neoplasm of brain: Secondary | ICD-10-CM | POA: Diagnosis not present

## 2015-12-06 DIAGNOSIS — C50919 Malignant neoplasm of unspecified site of unspecified female breast: Secondary | ICD-10-CM | POA: Insufficient documentation

## 2015-12-06 NOTE — Progress Notes (Signed)
*  PRELIMINARY RESULTS* Echocardiogram 2D Echocardiogram has been performed.  Leavy Cella 12/06/2015, 9:51 AM

## 2015-12-07 ENCOUNTER — Telehealth: Payer: Self-pay | Admitting: *Deleted

## 2015-12-07 DIAGNOSIS — M25511 Pain in right shoulder: Secondary | ICD-10-CM | POA: Diagnosis not present

## 2015-12-07 NOTE — Telephone Encounter (Addendum)
Patient aware of results  ----- Message from [name], MD sent at 12/06/2015  4:27 PM EST ----- Call - heart is pumping like a champion!!  pete

## 2015-12-08 ENCOUNTER — Encounter: Payer: Self-pay | Admitting: *Deleted

## 2015-12-08 MED FILL — CAPECITABINE 500 MG TABLET: 500 | 14 days supply | Qty: 112 | Fill #2

## 2015-12-09 ENCOUNTER — Ambulatory Visit
Admission: RE | Admit: 2015-12-09 | Discharge: 2015-12-09 | Disposition: A | Discharge status: Home / Self Care | Payer: 59 | Source: Ambulatory Visit | Attending: Radiation Oncology | Admitting: Radiation Oncology

## 2015-12-09 DIAGNOSIS — C7981 Secondary malignant neoplasm of breast: Secondary | ICD-10-CM | POA: Diagnosis not present

## 2015-12-09 DIAGNOSIS — M25511 Pain in right shoulder: Secondary | ICD-10-CM | POA: Diagnosis not present

## 2015-12-09 DIAGNOSIS — G939 Disorder of brain, unspecified: Secondary | ICD-10-CM | POA: Diagnosis not present

## 2015-12-09 DIAGNOSIS — C7931 Secondary malignant neoplasm of brain: Secondary | ICD-10-CM | POA: Insufficient documentation

## 2015-12-09 DIAGNOSIS — C7949 Secondary malignant neoplasm of other parts of nervous system: Secondary | ICD-10-CM

## 2015-12-09 MED ORDER — SODIUM CHLORIDE 0.9% FLUSH
10.0000 mL | Freq: Once | INTRAVENOUS | Status: AC
  Administered 2015-12-09: 10 mL via INTRAVENOUS

## 2015-12-09 MED ORDER — GADOBENATE DIMEGLUMINE 529 MG/ML IV SOLN
17.0000 mL | Freq: Once | INTRAVENOUS | Status: AC | PRN
  Administered 2015-12-09: 17 mL via INTRAVENOUS

## 2015-12-09 NOTE — Addendum Note (Signed)
Encounter addended by: Ernst Spell, RN on: 12/09/2015 10:18 AM<BR>     Documentation filed: Visit Diagnoses, Dx Association, Orders

## 2015-12-09 NOTE — Addendum Note (Signed)
Encounter addended by: Ernst Spell, RN on: 12/09/2015 11:05 AM<BR>     Documentation filed: Inpatient MAR

## 2015-12-10 ENCOUNTER — Ambulatory Visit: Payer: 59

## 2015-12-10 ENCOUNTER — Ambulatory Visit: Payer: 59 | Admitting: Hematology & Oncology

## 2015-12-10 ENCOUNTER — Other Ambulatory Visit: Payer: 59

## 2015-12-10 DIAGNOSIS — C7931 Secondary malignant neoplasm of brain: Secondary | ICD-10-CM | POA: Diagnosis not present

## 2015-12-13 DIAGNOSIS — M25511 Pain in right shoulder: Secondary | ICD-10-CM | POA: Diagnosis not present

## 2015-12-16 DIAGNOSIS — M25511 Pain in right shoulder: Secondary | ICD-10-CM | POA: Diagnosis not present

## 2015-12-20 DIAGNOSIS — M25511 Pain in right shoulder: Secondary | ICD-10-CM | POA: Diagnosis not present

## 2015-12-21 DIAGNOSIS — J9601 Acute respiratory failure with hypoxia: Secondary | ICD-10-CM | POA: Diagnosis not present

## 2015-12-22 ENCOUNTER — Ambulatory Visit: Payer: 59 | Admitting: Hematology & Oncology

## 2015-12-22 ENCOUNTER — Other Ambulatory Visit: Payer: 59

## 2015-12-22 ENCOUNTER — Ambulatory Visit: Payer: 59

## 2015-12-22 DIAGNOSIS — M25511 Pain in right shoulder: Secondary | ICD-10-CM | POA: Diagnosis not present

## 2015-12-23 ENCOUNTER — Other Ambulatory Visit: Payer: Self-pay | Admitting: *Deleted

## 2015-12-23 ENCOUNTER — Other Ambulatory Visit: Payer: Self-pay | Admitting: Family

## 2015-12-23 ENCOUNTER — Ambulatory Visit (HOSPITAL_BASED_OUTPATIENT_CLINIC_OR_DEPARTMENT_OTHER): Payer: 59

## 2015-12-23 ENCOUNTER — Other Ambulatory Visit (HOSPITAL_BASED_OUTPATIENT_CLINIC_OR_DEPARTMENT_OTHER): Payer: 59

## 2015-12-23 ENCOUNTER — Ambulatory Visit (HOSPITAL_BASED_OUTPATIENT_CLINIC_OR_DEPARTMENT_OTHER): Payer: 59 | Admitting: Family

## 2015-12-23 ENCOUNTER — Other Ambulatory Visit: Payer: 59

## 2015-12-23 ENCOUNTER — Ambulatory Visit: Payer: 59 | Admitting: Hematology & Oncology

## 2015-12-23 ENCOUNTER — Ambulatory Visit: Payer: 59

## 2015-12-23 VITALS — BP 115/71 | HR 70 | Temp 98.0°F | Wt 177.0 lb

## 2015-12-23 DIAGNOSIS — G4701 Insomnia due to medical condition: Secondary | ICD-10-CM

## 2015-12-23 DIAGNOSIS — C50911 Malignant neoplasm of unspecified site of right female breast: Secondary | ICD-10-CM

## 2015-12-23 DIAGNOSIS — Z5112 Encounter for antineoplastic immunotherapy: Secondary | ICD-10-CM | POA: Diagnosis not present

## 2015-12-23 DIAGNOSIS — C7931 Secondary malignant neoplasm of brain: Secondary | ICD-10-CM

## 2015-12-23 DIAGNOSIS — R64 Cachexia: Secondary | ICD-10-CM

## 2015-12-23 DIAGNOSIS — R5383 Other fatigue: Secondary | ICD-10-CM

## 2015-12-23 DIAGNOSIS — M899 Disorder of bone, unspecified: Secondary | ICD-10-CM | POA: Diagnosis not present

## 2015-12-23 DIAGNOSIS — C50919 Malignant neoplasm of unspecified site of unspecified female breast: Secondary | ICD-10-CM

## 2015-12-23 DIAGNOSIS — D508 Other iron deficiency anemias: Secondary | ICD-10-CM

## 2015-12-23 DIAGNOSIS — R110 Nausea: Secondary | ICD-10-CM

## 2015-12-23 DIAGNOSIS — R112 Nausea with vomiting, unspecified: Secondary | ICD-10-CM | POA: Diagnosis not present

## 2015-12-23 DIAGNOSIS — R7989 Other specified abnormal findings of blood chemistry: Secondary | ICD-10-CM | POA: Diagnosis not present

## 2015-12-23 DIAGNOSIS — C50912 Malignant neoplasm of unspecified site of left female breast: Secondary | ICD-10-CM

## 2015-12-23 DIAGNOSIS — G933 Postviral fatigue syndrome: Secondary | ICD-10-CM | POA: Diagnosis not present

## 2015-12-23 LAB — CBC WITH DIFFERENTIAL (CANCER CENTER ONLY)
BASO#: 0 10*3/uL (ref 0.0–0.2)
BASO%: 0.3 % (ref 0.0–2.0)
EOS%: 2 % (ref 0.0–7.0)
Eosinophils Absolute: 0.1 10*3/uL (ref 0.0–0.5)
HEMATOCRIT: 37.4 % (ref 34.8–46.6)
HGB: 12.7 g/dL (ref 11.6–15.9)
LYMPH#: 0.6 10*3/uL — AB (ref 0.9–3.3)
LYMPH%: 21.3 % (ref 14.0–48.0)
MCH: 30.9 pg (ref 26.0–34.0)
MCHC: 34 g/dL (ref 32.0–36.0)
MCV: 91 fL (ref 81–101)
MONO#: 0.4 10*3/uL (ref 0.1–0.9)
MONO%: 11.7 % (ref 0.0–13.0)
NEUT#: 1.9 10*3/uL (ref 1.5–6.5)
NEUT%: 64.7 % (ref 39.6–80.0)
Platelets: 83 10*3/uL — ABNORMAL LOW (ref 145–400)
RBC: 4.11 10*6/uL (ref 3.70–5.32)
RDW: 16.9 % — ABNORMAL HIGH (ref 11.1–15.7)
WBC: 3 10*3/uL — ABNORMAL LOW (ref 3.9–10.0)

## 2015-12-23 LAB — CMP (CANCER CENTER ONLY)
ALBUMIN: 4.3 g/dL (ref 3.3–5.5)
ALK PHOS: 150 U/L — AB (ref 26–84)
ALT: 58 U/L — AB (ref 10–47)
AST: 76 U/L — AB (ref 11–38)
BILIRUBIN TOTAL: 0.9 mg/dL (ref 0.20–1.60)
BUN, Bld: 5 mg/dL — ABNORMAL LOW (ref 7–22)
CALCIUM: 9 mg/dL (ref 8.0–10.3)
CO2: 32 mEq/L (ref 18–33)
Chloride: 103 mEq/L (ref 98–108)
Creat: 0.9 mg/dl (ref 0.6–1.2)
GLUCOSE: 97 mg/dL (ref 73–118)
POTASSIUM: 3.2 meq/L — AB (ref 3.3–4.7)
Sodium: 140 mEq/L (ref 128–145)
TOTAL PROTEIN: 7.2 g/dL (ref 6.4–8.1)

## 2015-12-23 MED ORDER — TRAZODONE HCL 100 MG PO TABS: ORAL_TABLET | ORAL | Status: DC

## 2015-12-23 MED ORDER — NYSTATIN POWD: 1.0000 "application " | Freq: Two times a day (BID) | Status: DC

## 2015-12-23 MED ORDER — SODIUM CHLORIDE 0.9 % IV SOLN
420.0000 mg | Freq: Once | INTRAVENOUS | Status: AC
  Administered 2015-12-23: 420 mg via INTRAVENOUS
  Filled 2015-12-23: qty 14

## 2015-12-23 MED ORDER — HEPARIN SOD (PORK) LOCK FLUSH 100 UNIT/ML IV SOLN
500.0000 [IU] | Freq: Once | INTRAVENOUS | Status: AC | PRN
  Administered 2015-12-23: 500 [IU]
  Filled 2015-12-23: qty 5

## 2015-12-23 MED ORDER — ALPRAZOLAM 1 MG PO TABS: 1.0000 mg | ORAL_TABLET | Freq: Two times a day (BID) | ORAL | Status: DC | PRN

## 2015-12-23 MED ORDER — METOCLOPRAMIDE HCL 10 MG PO TABS: 10.0000 mg | ORAL_TABLET | Freq: Four times a day (QID) | ORAL | Status: DC | PRN

## 2015-12-23 MED ORDER — LORAZEPAM 2 MG/ML IJ SOLN
INTRAMUSCULAR | Status: AC
  Filled 2015-12-23: qty 1

## 2015-12-23 MED ORDER — TRASTUZUMAB CHEMO INJECTION 440 MG
6.0000 mg/kg | Freq: Once | INTRAVENOUS | Status: AC
  Administered 2015-12-23: 483 mg via INTRAVENOUS
  Filled 2015-12-23: qty 23

## 2015-12-23 MED ORDER — DIPHENHYDRAMINE HCL 25 MG PO CAPS
ORAL_CAPSULE | ORAL | Status: AC
  Filled 2015-12-23: qty 2

## 2015-12-23 MED ORDER — PALONOSETRON HCL INJECTION 0.25 MG/5ML
0.2500 mg | Freq: Once | INTRAVENOUS | Status: AC
  Administered 2015-12-23: 0.25 mg via INTRAVENOUS

## 2015-12-23 MED ORDER — LORAZEPAM 2 MG/ML IJ SOLN
1.0000 mg | Freq: Once | INTRAMUSCULAR | Status: AC
  Administered 2015-12-23: 1 mg via INTRAVENOUS

## 2015-12-23 MED ORDER — DIPHENHYDRAMINE HCL 25 MG PO CAPS
50.0000 mg | ORAL_CAPSULE | Freq: Once | ORAL | Status: AC
  Administered 2015-12-23: 50 mg via ORAL

## 2015-12-23 MED ORDER — PALONOSETRON HCL INJECTION 0.25 MG/5ML
INTRAVENOUS | Status: AC
  Filled 2015-12-23: qty 5

## 2015-12-23 MED ORDER — ZOLEDRONIC ACID 4 MG/100ML IV SOLN
4.0000 mg | Freq: Once | INTRAVENOUS | Status: AC
  Administered 2015-12-23: 4 mg via INTRAVENOUS
  Filled 2015-12-23: qty 100

## 2015-12-23 MED ORDER — ACETAMINOPHEN 325 MG PO TABS
ORAL_TABLET | ORAL | Status: AC
  Filled 2015-12-23: qty 2

## 2015-12-23 MED ORDER — LORAZEPAM 1 MG PO TABS: ORAL_TABLET | ORAL | Status: DC

## 2015-12-23 MED ORDER — ACETAMINOPHEN 325 MG PO TABS
650.0000 mg | ORAL_TABLET | Freq: Once | ORAL | Status: AC
  Administered 2015-12-23: 650 mg via ORAL

## 2015-12-23 MED ORDER — SODIUM CHLORIDE 0.9 % IJ SOLN
10.0000 mL | INTRAMUSCULAR | Status: DC | PRN
  Administered 2015-12-23: 10 mL
  Filled 2015-12-23: qty 10

## 2015-12-23 MED ORDER — SODIUM CHLORIDE 0.9 % IV SOLN
Freq: Once | INTRAVENOUS | Status: AC
  Administered 2015-12-23: 11:00:00 via INTRAVENOUS

## 2015-12-23 MED FILL — ALPRAZolam 1 MG TABS: 1 | 30 days supply | Qty: 60 | Fill #0

## 2015-12-23 MED FILL — LORazepam 1 MG TABS: 1 | 22 days supply | Qty: 90 | Fill #0

## 2015-12-23 MED FILL — OLANZapine 5 MG TABS: 5 | 30 days supply | Qty: 30 | Fill #3

## 2015-12-23 MED FILL — METOCLOPRAMIDE 10 MG TABLET: 10 | 30 days supply | Qty: 120 | Fill #0

## 2015-12-23 MED FILL — NYSTATIN 100,000 UNIT/GM PO: 100000 | 15 days supply | Qty: 15 | Fill #0

## 2015-12-23 MED FILL — traZODone HCL 100 MG TABS: 100 | 30 days supply | Qty: 60 | Fill #0

## 2015-12-23 NOTE — Progress Notes (Signed)
Hematology and Oncology Follow Up Visit  Bianca Kent [id number] [birth year] 50 y.o. 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 She has been having extreme nausea for the last 2 weeks and had an episode of projectile vomiting last night. She has been rotating all of her antiemetics around the clock with no relief.  She has no appetite with the nausea and has not been drinking much fluid. Her weight is down 7 lbs today.  Her CA 27.29 in February was 24.2. She started a new cycle of Xeloda today.  Her platelet count is 83. No episodes of bleeding or bruising.  No fever, chills, rash, headache, chest pain, palpitations, abdominal pain, constipation, diarrhea, blood in urine or stool.  She is now on 2L supplemental O2 24 hours a day and this has helped improved her SOB. She has had a dry cough that comes and goes.  No swelling in her extremities. She is currently in PT for her right shoulder and states that it is going well but her shoulder is a little sore. The numbness and tingling in her right arm since surgery comes and goes.   Medications:    Medication List       This list is accurate as of: 12/23/15  1:21 PM.  Always use your most recent med list.               albuterol 108 (90 Base) MCG/ACT inhaler  Commonly known as:  PROVENTIL HFA;VENTOLIN HFA  Inhale 2 puffs into the lungs every 4 (four) hours as needed for wheezing or shortness of breath.     ALPRAZolam 1 MG tablet  Commonly known as:  XANAX  Take 1 tablet (1 mg total) by mouth 2 (two) times daily as needed for anxiety.     aspirin EC 81 MG EC tablet  Generic  drug:  aspirin  Take 81 mg by mouth daily. Swallow whole.     calcium carbonate 500 MG chewable tablet  Commonly known as:  TUMS - dosed in mg elemental calcium  Chew 2 tablets by mouth 2 (two) times daily.     capecitabine 500 MG tablet  Commonly known as:  XELODA  Take 4 tablets with food twice a day for 14 days     diphenhydrAMINE 25 MG tablet  Commonly known as:  SOMINEX  Take 100 mg by mouth at bedtime as needed for sleep. Pt states she is taking up to 200 mg Benadryl at night and still not sleeping     docusate sodium 100 MG capsule  Commonly known as:  COLACE  Take 1 capsule (100 mg total) by mouth 2 (two) times daily as needed for mild constipation or moderate constipation.     dronabinol 10 MG capsule  Commonly known as:  MARINOL  Take 1 capsule (10 mg total) by mouth 3 (three) times daily before meals.     ergocalciferol 50000 units capsule  Commonly known as:  VITAMIN D2  Take 1 capsule (50,000 Units total) by mouth 2 (two) times a week.     fentaNYL 50 MCG/HR  Commonly known as:  [place] - dosed mcg/hr  Place 1 patch (50 mcg total) onto the skin every other day.     HYDROcodone-acetaminophen 5-325 MG tablet  Commonly known as:  NORCO  Take 1-2 tablets by mouth every 4 (four) hours as needed for moderate pain or severe pain.     HYDROmorphone 4 MG tablet  Commonly known as:  DILAUDID  Take 1 tablet (4 mg total) by mouth every 4 (four) hours as needed for severe pain.     Lactulose Soln  Take 2 TBLSP every 6 hours until bowel movement     lidocaine 2 % solution  Commonly known as:  XYLOCAINE  Use as directed 20 mLs in the mouth or throat as needed for mouth pain.     lidocaine 5 %  Commonly known as:  LIDODERM  Place 1 patch onto the skin daily. Remove & Discard patch within 12 hours or as directed by MD     lidocaine-prilocaine cream  Commonly known as:  EMLA  Apply 1 application topically as needed.     loratadine-pseudoephedrine 10-240 MG 24 hr  tablet  Commonly known as:  CLARITIN-D 24-hour  Take 1 tablet by mouth daily.     LORazepam 1 MG tablet  Commonly known as:  ATIVAN  Place 1 tablet under the tongue, IF NEEDED, every 6 hours for nausea.     methylphenidate 10 MG tablet  Commonly known as:  RITALIN  Take 1 tablet (10 mg total) by mouth 2 (two) times daily.     metoCLOPramide 10 MG tablet  Commonly known as:  REGLAN  Take 1 tablet (10 mg total) by mouth every 6 (six) hours as needed for nausea.     multivitamin tablet  Take 1 tablet by mouth daily.     naloxegol oxalate 25 MG Tabs tablet  Commonly known as:  MOVANTIK  Take 1 tablet (25 mg total) by mouth daily.     NONFORMULARY OR COMPOUNDED ITEM  Take 5 mLs by mouth as needed (irritation). DUKES MOUTHWASH  SWISH AND SWALLOW     OLANZapine 5 MG tablet  Commonly known as:  ZYPREXA  Take 1 tablet (5 mg total) by mouth at bedtime.     ondansetron 8 MG tablet  Commonly known as:  ZOFRAN  Take 1 tablet (8 mg total) by mouth every 8 (eight) hours as needed for nausea or vomiting.     pantoprazole 40 MG tablet  Commonly known as:  PROTONIX  Take 1 tablet (40 mg total) by mouth daily.     PEPTO-BISMOL PO  Take 1 tablet by mouth as needed (upset stomach).     pilocarpine 5 MG tablet  Commonly known as:  SALAGEN  Take 1 tablet (5 mg total) by mouth 2 (two) times daily.     prochlorperazine 10 MG tablet  Commonly known as:  COMPAZINE  Take 1 tablet (10 mg total) by mouth every 6 (six) hours as needed for nausea or vomiting.     promethazine 25 MG suppository  Commonly known as:  PHENERGAN  Place 1 suppository (25 mg total) rectally every 6 (six) hours as needed for nausea or vomiting.     traZODone 100 MG tablet  Commonly known as:  DESYREL  TAKE 1 TO 2 TABLETS BY MOUTH AS NEEDED FOR SLEEP        Allergies:  Allergies  Allergen Reactions  . Iohexol      Code: RASH, Desc: VERY STRONG FAMILY HX  OF ANGIOEDEMA WHEN RECEIVING IV CONTRAST; PT HAS BEEN  PREMEDICATED FOR OTHER CONTRASTED STUDIES(IN CATH. LAB)  KR, Onset Date: [id number]   . Prednisone Itching    Capillary beds bust  . Tetanus Toxoids Other (See Comments)    Ran a high fever for 48 hours  . Theophyllines Hives    Mental changes  . Versed [Midazolam] Other (See Comments)    Pt becomes violent    Past Medical History, Surgical history, Social history, and Family History were reviewed and updated.  Review of Systems: All other 10 point review of systems is negative.   Physical Exam:  weight is 177 lb (80.287 kg). Her oral temperature is 98 F (36.7 C). Her blood pressure is 115/71 and her pulse is 70.   Wt Readings from Last 3 Encounters:  12/23/15 177 lb (80.287 kg)  12/09/15 180 lb (81.647 kg)  12/02/15 184 lb (83.462 kg)    Ocular: Sclerae unicteric, pupils equal, round and reactive to light Ear-nose-throat: Oropharynx clear, dentition fair Lymphatic: No cervical supraclavicular or axillary adenopathy Lungs no rales or rhonchi, good excursion bilaterally Heart regular rate and rhythm, no murmur appreciated Abd soft, nontender, positive bowel sounds, no liver or spleen tip palpated on exam  MSK no focal spinal tenderness, no joint edema Neuro: non-focal, well-oriented, appropriate affect Breasts: No changes.No mass, lesions, rash or lymphadenopathy found on exam   Lab Results  Component Value Date   WBC 3.0* 12/23/2015   HGB 12.7 12/23/2015   HCT 37.4 12/23/2015   MCV 91 12/23/2015   PLT 83* 12/23/2015   Lab Results  Component Value Date   FERRITIN 259 12/02/2015   IRON 115 12/02/2015   TIBC 245 12/02/2015   UIBC 130 12/02/2015   IRONPCTSAT 47 12/02/2015   Lab Results  Component Value Date   RBC 4.11 12/23/2015   No results found for: KPAFRELGTCHN, LAMBDASER, KAPLAMBRATIO No results found for: IGGSERUM, IGA, IGMSERUM No results found for: Odetta Pink, SPEI   Chemistry      Component  Value Date/Time   NA 140 12/23/2015 0922   NA 143 11/08/2015 1047   NA 138 04/30/2015 1019   K 3.2* 12/23/2015 0922   K 4.0 11/08/2015 1047   K 3.5 04/30/2015 1019   CL 103 12/23/2015 0922   CL 110 11/08/2015 1047   CO2 32 12/23/2015 0922   CO2 24 11/08/2015 1047   CO2 29 04/30/2015 1019   BUN 5* 12/23/2015 0922   BUN 6 11/08/2015 1047   BUN 9.7 04/30/2015 1019   CREATININE 0.9 12/23/2015 0922   CREATININE 0.78 11/08/2015 1047   CREATININE 0.8 04/30/2015 1019      Component Value Date/Time   CALCIUM 9.0 12/23/2015 0922   CALCIUM 9.4 11/08/2015 1047   CALCIUM 9.7 04/30/2015 1019   ALKPHOS 150* 12/23/2015 0922   ALKPHOS 257* 04/30/2015 1019   ALKPHOS 238* 03/06/2015 0425   AST 76* 12/23/2015 0922   AST 57* 04/30/2015 1019   AST 41 03/06/2015 0425   ALT 58* 12/23/2015 0922   ALT 41 04/30/2015 1019   ALT 38 03/06/2015 0425   BILITOT 0.90 12/23/2015 0922   BILITOT 0.60 04/30/2015 1019   BILITOT 0.4 03/06/2015 0425     Impression and Plan: Bianca Kent is 50 year old white female with triple positive metastatic breast cancer. She is having severe nausea and an episode of projectile vomiting. No relief from her current anti emetics. Her LFT's continue to be elevated.  We will have her stop the Xeloda for now and see if this makes a difference for her. We will proceed with Herceptin and Perjeta today as planned and also give her Aloxi.  She has used Ativan for nausea before and was able to get some relief with it. She denies any previous allergic reaction. She was given a prescription for this.  We will also give her fluids today as well.  She has her current appointment and treatment schedule.  She knows to call here with any questions or concerns. We can certainly see her sooner if need be.   Eliezer Bottom, NP 3/2/20171:21 PM

## 2015-12-23 NOTE — Patient Instructions (Signed)
[facility] Discharge Instructions for Patients Receiving Chemotherapy  Today you received the following chemotherapy agents Herceptin and Perjeta.   To help prevent nausea and vomiting after your treatment, we encourage you to take your nausea medication.   If you develop nausea and vomiting that is not controlled by your nausea medication, call the clinic.   BELOW ARE SYMPTOMS THAT SHOULD BE REPORTED IMMEDIATELY:  *FEVER GREATER THAN 100.5 F  *CHILLS WITH OR WITHOUT FEVER  NAUSEA AND VOMITING THAT IS NOT CONTROLLED WITH YOUR NAUSEA MEDICATION  *UNUSUAL SHORTNESS OF BREATH  *UNUSUAL BRUISING OR BLEEDING  TENDERNESS IN MOUTH AND THROAT WITH OR WITHOUT PRESENCE OF ULCERS  *URINARY PROBLEMS  *BOWEL PROBLEMS  UNUSUAL RASH Items with * indicate a potential emergency and should be followed up as soon as possible.  Feel free to call the clinic you have any questions or concerns. The clinic phone number is [phone].  Please show the CHEMO ALERT CARD at check-in to the Emergency Department and triage nurse.   

## 2015-12-24 LAB — CANCER ANTIGEN 27-29 (PARALLEL TESTING): CA 27.29: 20 U/mL (ref <38)

## 2015-12-24 LAB — CANCER ANTIGEN 27.29: CAN 27.29: 17 U/mL (ref 0.0–38.6)

## 2015-12-31 ENCOUNTER — Ambulatory Visit: Payer: 59

## 2015-12-31 ENCOUNTER — Other Ambulatory Visit: Payer: 59

## 2015-12-31 ENCOUNTER — Ambulatory Visit: Payer: 59 | Admitting: Hematology & Oncology

## 2016-01-13 ENCOUNTER — Other Ambulatory Visit: Payer: Self-pay | Admitting: *Deleted

## 2016-01-13 ENCOUNTER — Ambulatory Visit (HOSPITAL_BASED_OUTPATIENT_CLINIC_OR_DEPARTMENT_OTHER): Payer: 59 | Admitting: Hematology & Oncology

## 2016-01-13 ENCOUNTER — Other Ambulatory Visit: Payer: Self-pay | Admitting: Family

## 2016-01-13 ENCOUNTER — Ambulatory Visit (HOSPITAL_BASED_OUTPATIENT_CLINIC_OR_DEPARTMENT_OTHER): Payer: 59

## 2016-01-13 ENCOUNTER — Encounter: Payer: Self-pay | Admitting: Hematology & Oncology

## 2016-01-13 ENCOUNTER — Other Ambulatory Visit (HOSPITAL_BASED_OUTPATIENT_CLINIC_OR_DEPARTMENT_OTHER): Payer: 59

## 2016-01-13 ENCOUNTER — Ambulatory Visit (HOSPITAL_BASED_OUTPATIENT_CLINIC_OR_DEPARTMENT_OTHER)
Admission: RE | Admit: 2016-01-13 | Discharge: 2016-01-13 | Disposition: A | Discharge status: Home / Self Care | Payer: 59 | Source: Ambulatory Visit | Attending: Hematology & Oncology | Admitting: Hematology & Oncology

## 2016-01-13 VITALS — BP 126/82 | HR 83 | Temp 97.7°F | Resp 18 | Ht 67.0 in | Wt 171.0 lb

## 2016-01-13 DIAGNOSIS — M899 Disorder of bone, unspecified: Secondary | ICD-10-CM | POA: Diagnosis not present

## 2016-01-13 DIAGNOSIS — C7931 Secondary malignant neoplasm of brain: Secondary | ICD-10-CM

## 2016-01-13 DIAGNOSIS — G4701 Insomnia due to medical condition: Secondary | ICD-10-CM

## 2016-01-13 DIAGNOSIS — C7951 Secondary malignant neoplasm of bone: Secondary | ICD-10-CM | POA: Diagnosis not present

## 2016-01-13 DIAGNOSIS — K529 Noninfective gastroenteritis and colitis, unspecified: Secondary | ICD-10-CM

## 2016-01-13 DIAGNOSIS — R5383 Other fatigue: Secondary | ICD-10-CM

## 2016-01-13 DIAGNOSIS — R161 Splenomegaly, not elsewhere classified: Secondary | ICD-10-CM | POA: Insufficient documentation

## 2016-01-13 DIAGNOSIS — R64 Cachexia: Secondary | ICD-10-CM | POA: Diagnosis not present

## 2016-01-13 DIAGNOSIS — C50911 Malignant neoplasm of unspecified site of right female breast: Secondary | ICD-10-CM | POA: Diagnosis not present

## 2016-01-13 DIAGNOSIS — C50912 Malignant neoplasm of unspecified site of left female breast: Secondary | ICD-10-CM | POA: Insufficient documentation

## 2016-01-13 DIAGNOSIS — D508 Other iron deficiency anemias: Secondary | ICD-10-CM

## 2016-01-13 DIAGNOSIS — Z5112 Encounter for antineoplastic immunotherapy: Secondary | ICD-10-CM | POA: Diagnosis not present

## 2016-01-13 DIAGNOSIS — A049 Bacterial intestinal infection, unspecified: Secondary | ICD-10-CM

## 2016-01-13 DIAGNOSIS — M8448XA Pathological fracture, other site, initial encounter for fracture: Secondary | ICD-10-CM | POA: Insufficient documentation

## 2016-01-13 DIAGNOSIS — R110 Nausea: Secondary | ICD-10-CM

## 2016-01-13 DIAGNOSIS — R634 Abnormal weight loss: Secondary | ICD-10-CM

## 2016-01-13 DIAGNOSIS — R1013 Epigastric pain: Secondary | ICD-10-CM | POA: Diagnosis not present

## 2016-01-13 DIAGNOSIS — R112 Nausea with vomiting, unspecified: Secondary | ICD-10-CM

## 2016-01-13 DIAGNOSIS — Z23 Encounter for immunization: Secondary | ICD-10-CM

## 2016-01-13 LAB — CMP (CANCER CENTER ONLY)
ALBUMIN: 3.6 g/dL (ref 3.3–5.5)
ALK PHOS: 156 U/L — AB (ref 26–84)
ALT: 69 U/L — AB (ref 10–47)
AST: 46 U/L — ABNORMAL HIGH (ref 11–38)
BUN: 13 mg/dL (ref 7–22)
CALCIUM: 9.1 mg/dL (ref 8.0–10.3)
CO2: 26 mEq/L (ref 18–33)
Chloride: 106 mEq/L (ref 98–108)
Creat: 0.9 mg/dl (ref 0.6–1.2)
Glucose, Bld: 95 mg/dL (ref 73–118)
POTASSIUM: 3.3 meq/L (ref 3.3–4.7)
Sodium: 140 mEq/L (ref 128–145)
TOTAL PROTEIN: 7.3 g/dL (ref 6.4–8.1)
Total Bilirubin: 0.6 mg/dl (ref 0.20–1.60)

## 2016-01-13 LAB — CBC WITH DIFFERENTIAL (CANCER CENTER ONLY)
BASO#: 0 10*3/uL (ref 0.0–0.2)
BASO%: 0.3 % (ref 0.0–2.0)
EOS ABS: 0.1 10*3/uL (ref 0.0–0.5)
EOS%: 1.7 % (ref 0.0–7.0)
HEMATOCRIT: 42.4 % (ref 34.8–46.6)
HEMOGLOBIN: 14.6 g/dL (ref 11.6–15.9)
LYMPH#: 0.8 10*3/uL — AB (ref 0.9–3.3)
LYMPH%: 11.6 % — ABNORMAL LOW (ref 14.0–48.0)
MCH: 30.9 pg (ref 26.0–34.0)
MCHC: 34.4 g/dL (ref 32.0–36.0)
MCV: 90 fL (ref 81–101)
MONO#: 0.5 10*3/uL (ref 0.1–0.9)
MONO%: 7.3 % (ref 0.0–13.0)
NEUT#: 5.7 10*3/uL (ref 1.5–6.5)
NEUT%: 79.1 % (ref 39.6–80.0)
Platelets: 110 10*3/uL — ABNORMAL LOW (ref 145–400)
RBC: 4.73 10*6/uL (ref 3.70–5.32)
RDW: 16.4 % — ABNORMAL HIGH (ref 11.1–15.7)
WBC: 7.2 10*3/uL (ref 3.9–10.0)

## 2016-01-13 LAB — FERRITIN: Ferritin: 190 ng/ml (ref 9–269)

## 2016-01-13 LAB — IRON AND TIBC
%SAT: 43 % (ref 21–57)
IRON: 118 ug/dL (ref 41–142)
TIBC: 273 ug/dL (ref 236–444)
UIBC: 154 ug/dL (ref 120–384)

## 2016-01-13 MED ORDER — SODIUM CHLORIDE 0.9 % IV SOLN
3.0000 g | Freq: Once | INTRAVENOUS | Status: AC
  Administered 2016-01-13: 3 g via INTRAVENOUS
  Filled 2016-01-13: qty 3

## 2016-01-13 MED ORDER — HYDROMORPHONE HCL 4 MG/ML IJ SOLN
4.0000 mg | Freq: Once | INTRAMUSCULAR | Status: AC
  Administered 2016-01-13: 4 mg via INTRAVENOUS

## 2016-01-13 MED ORDER — HEPARIN SOD (PORK) LOCK FLUSH 100 UNIT/ML IV SOLN
500.0000 [IU] | Freq: Once | INTRAVENOUS | Status: AC | PRN
  Administered 2016-01-13: 500 [IU]
  Filled 2016-01-13: qty 5

## 2016-01-13 MED ORDER — TRASTUZUMAB CHEMO INJECTION 440 MG
6.0000 mg/kg | Freq: Once | INTRAVENOUS | Status: AC
  Administered 2016-01-13: 483 mg via INTRAVENOUS
  Filled 2016-01-13: qty 23

## 2016-01-13 MED ORDER — FAMOTIDINE IN NACL 20-0.9 MG/50ML-% IV SOLN
INTRAVENOUS | Status: AC
  Filled 2016-01-13: qty 100

## 2016-01-13 MED ORDER — ACETAMINOPHEN 325 MG PO TABS
650.0000 mg | ORAL_TABLET | Freq: Once | ORAL | Status: AC
  Administered 2016-01-13: 650 mg via ORAL

## 2016-01-13 MED ORDER — LIDOCAINE-PRILOCAINE 2.5-2.5 % EX CREA
1.0000 | TOPICAL_CREAM | CUTANEOUS | Status: AC | PRN
Start: 2016-01-13 — End: ?

## 2016-01-13 MED ORDER — ZOLEDRONIC ACID 4 MG/100ML IV SOLN
4.0000 mg | Freq: Once | INTRAVENOUS | Status: AC
  Administered 2016-01-13: 4 mg via INTRAVENOUS
  Filled 2016-01-13: qty 100

## 2016-01-13 MED ORDER — HYDROMORPHONE HCL 4 MG PO TABS: 4.0000 mg | ORAL_TABLET | ORAL | Status: DC | PRN

## 2016-01-13 MED ORDER — FAMOTIDINE IN NACL 20-0.9 MG/50ML-% IV SOLN
40.0000 mg | Freq: Once | INTRAVENOUS | Status: AC
  Administered 2016-01-13: 40 mg via INTRAVENOUS

## 2016-01-13 MED ORDER — LORAZEPAM 2 MG/ML IJ SOLN
INTRAMUSCULAR | Status: AC
  Filled 2016-01-13: qty 1

## 2016-01-13 MED ORDER — SODIUM CHLORIDE 0.9 % IV SOLN: Freq: Once | INTRAVENOUS | Status: DC

## 2016-01-13 MED ORDER — SODIUM CHLORIDE 0.9 % IV SOLN
420.0000 mg | Freq: Once | INTRAVENOUS | Status: AC
  Administered 2016-01-13: 420 mg via INTRAVENOUS
  Filled 2016-01-13: qty 14

## 2016-01-13 MED ORDER — ACETAMINOPHEN 325 MG PO TABS
ORAL_TABLET | ORAL | Status: AC
  Filled 2016-01-13: qty 2

## 2016-01-13 MED ORDER — METRONIDAZOLE 500 MG PO TABS: 500.0000 mg | ORAL_TABLET | Freq: Three times a day (TID) | ORAL | Status: DC

## 2016-01-13 MED ORDER — LORAZEPAM 2 MG/ML IJ SOLN
0.5000 mg | Freq: Once | INTRAMUSCULAR | Status: AC
  Administered 2016-01-13: 0.5 mg via INTRAVENOUS

## 2016-01-13 MED ORDER — SODIUM CHLORIDE 0.9 % IJ SOLN
10.0000 mL | INTRAMUSCULAR | Status: DC | PRN
  Administered 2016-01-13: 10 mL
  Filled 2016-01-13: qty 10

## 2016-01-13 MED ORDER — METOCLOPRAMIDE HCL 5 MG/ML IJ SOLN
20.0000 mg | Freq: Once | INTRAVENOUS | Status: AC
  Administered 2016-01-13: 20 mg via INTRAVENOUS
  Filled 2016-01-13: qty 4

## 2016-01-13 MED ORDER — DIPHENHYDRAMINE HCL 25 MG PO CAPS
ORAL_CAPSULE | ORAL | Status: AC
  Filled 2016-01-13: qty 2

## 2016-01-13 MED ORDER — HYDROMORPHONE HCL 4 MG/ML IJ SOLN
INTRAMUSCULAR | Status: AC
  Filled 2016-01-13: qty 1

## 2016-01-13 MED ORDER — SODIUM CHLORIDE 0.9 % IV SOLN
Freq: Once | INTRAVENOUS | Status: AC
  Administered 2016-01-13: 11:00:00 via INTRAVENOUS

## 2016-01-13 MED ORDER — DIPHENHYDRAMINE HCL 25 MG PO CAPS
50.0000 mg | ORAL_CAPSULE | Freq: Once | ORAL | Status: AC
  Administered 2016-01-13: 50 mg via ORAL

## 2016-01-13 MED ORDER — AMOXICILLIN-POT CLAVULANATE 875-125 MG PO TABS: 1.0000 | ORAL_TABLET | Freq: Two times a day (BID) | ORAL | Status: DC

## 2016-01-13 MED FILL — LIDOCAINE-PRILOCAINE CREAM: 2.5-2.5 | 30 days supply | Qty: 30 | Fill #0

## 2016-01-13 MED FILL — AMOX-CLAV 875-125 MG TABLET: 875-125 | 10 days supply | Qty: 20 | Fill #0

## 2016-01-13 MED FILL — HYDROCODON-APAP 5-325: 5-325 | 10 days supply | Qty: 40 | Fill #0

## 2016-01-13 MED FILL — metroNIDAZOLE 500 MG TABS: 500 | 10 days supply | Qty: 30 | Fill #0

## 2016-01-13 MED FILL — HYDROmorphone HCL 4 MG TABS: 4 | 15 days supply | Qty: 90 | Fill #0

## 2016-01-13 NOTE — Patient Instructions (Signed)
[facility] Discharge Instructions for Patients Receiving Chemotherapy  Today you received the following chemotherapy agents Herceptin and Perjeta  To help prevent nausea and vomiting after your treatment, we encourage you to take your nausea medication    If you develop nausea and vomiting that is not controlled by your nausea medication, call the clinic.   BELOW ARE SYMPTOMS THAT SHOULD BE REPORTED IMMEDIATELY:  *FEVER GREATER THAN 100.5 F  *CHILLS WITH OR WITHOUT FEVER  NAUSEA AND VOMITING THAT IS NOT CONTROLLED WITH YOUR NAUSEA MEDICATION  *UNUSUAL SHORTNESS OF BREATH  *UNUSUAL BRUISING OR BLEEDING  TENDERNESS IN MOUTH AND THROAT WITH OR WITHOUT PRESENCE OF ULCERS  *URINARY PROBLEMS  *BOWEL PROBLEMS  UNUSUAL RASH Items with * indicate a potential emergency and should be followed up as soon as possible.  Feel free to call the clinic you have any questions or concerns. The clinic phone number is [phone].  Please show the [facility] at check-in to the Emergency Department and triage nurse.  Ampicillin; Sulbactam injection What is this medicine? AMPICILLIN; SULBACTAM (am pi SILL in; sul BAK tam) is a penicillin antibiotic. It is used to treat certain kinds of bacterial infections. It will not work for colds, flu, or other viral infections. This medicine may be used for other purposes; ask your health care provider or pharmacist if you have questions. What should I tell my health care provider before I take this medicine? They need to know if you have any of these conditions: -heart disease -kidney disease -mononucleosis -an unusual or allergic reaction to ampicillin, other penicillins or antibiotics, foods, dyes, or preservatives -pregnant or trying to get pregnant -breast-feeding How should I use this medicine? This medicine is infused into a vein or injected deep into a muscle. It is usually given by a health care professional in a hospital  or clinic setting. If you get this medicine at home, you will be taught how to prepare and give this medicine. Use exactly as directed. Take your medicine at regular intervals. Do not take your medicine more often than directed. It is important that you put your used needles and syringes in a special sharps container. Do not put them in a trash can. If you do not have a sharps container, call your pharmacist or healthcare provider to get one. Talk to your pediatrician regarding the use of this medicine in children. Special care may be needed. Overdosage: If you think you have taken too much of this medicine contact a poison control center or emergency room at once. NOTE: This medicine is only for you. Do not share this medicine with others. What if I miss a dose? If you miss a dose, take it as soon as you can. If it is almost time for your next dose, take only that dose. Do not take double or extra doses. What may interact with this medicine? -allopurinol -female hormones, including contraceptive or birth control pills -probenecid -some other antibiotics given by injection This list may not describe all possible interactions. Give your health care provider a list of all the medicines, herbs, non-prescription drugs, or dietary supplements you use. Also tell them if you smoke, drink alcohol, or use illegal drugs. Some items may interact with your medicine. What should I watch for while using this medicine? Tell your doctor or health care professional if your symptoms do not improve or if you get new symptoms. Do not treat diarrhea with over the counter products. Contact your doctor  if you have diarrhea that lasts more than 2 days or if the diarrhea is severe and watery. This medicine can interfere with some urine glucose tests. If you use such tests, talk with your health care professional. Birth control pills may not work properly while you are taking this medicine. Talk to your doctor about using  an extra method of birth control. What side effects may I notice from receiving this medicine? Side effects that you should report to your doctor or health care professional as soon as possible: -allergic reactions like skin rash or hives, swelling of the face, lips, or tongue -chest pain -difficulty breathing -fever, chills -pain or difficulty passing urine -redness, blistering, peeling or loosening of the skin, including inside the mouth -seizures -unusual bleeding, bruising -unusually weak or tired Side effects that usually do not require medical attention (report to your doctor or health care professional if they continue or are bothersome): -diarrhea -headache -heartburn -nausea, vomiting -pain, irritation at the site of injection -sore mouth, tongue -stomach gas This list may not describe all possible side effects. Call your doctor for medical advice about side effects. You may report side effects to FDA at 1-800-FDA-1088. Where should I keep my medicine? Keep out of the reach of children. You will be instructed on how to store this medicine. Throw away any unused medicine after the expiration date on the label. NOTE: This sheet is a summary. It may not cover all possible information. If you have questions about this medicine, talk to your doctor, pharmacist, or health care provider.    2016, Elsevier/Gold Standard. (2007-12-31 14:37:17) Nausea and Vomiting Nausea means you feel sick to your stomach. Throwing up (vomiting) is a reflex where stomach contents come out of your mouth. HOME CARE   Take medicine as told by your doctor.  Do not force yourself to eat. However, you do need to drink fluids.  If you feel like eating, eat a normal diet as told by your doctor.  Eat rice, wheat, potatoes, bread, lean meats, yogurt, fruits, and vegetables.  Avoid high-fat foods.  Drink enough fluids to keep your pee (urine) clear or pale yellow.  Ask your doctor how to replace body  fluid losses (rehydrate). Signs of body fluid loss (dehydration) include:  Feeling very thirsty.  Dry lips and mouth.  Feeling dizzy.  Dark pee.  Peeing less than normal.  Feeling confused.  Fast breathing or heart rate. GET HELP RIGHT AWAY IF:   You have blood in your throw up.  You have black or bloody poop (stool).  You have a bad headache or stiff neck.  You feel confused.  You have bad belly (abdominal) pain.  You have chest pain or trouble breathing.  You do not pee at least once every 8 hours.  You have cold, clammy skin.  You keep throwing up after 24 to 48 hours.  You have a fever. MAKE SURE YOU:   Understand these instructions.  Will watch your condition.  Will get help right away if you are not doing well or get worse.   This information is not intended to replace advice given to you by your health care provider. Make sure you discuss any questions you have with your health care provider.   Document Released: 03/27/2008 Document Revised: 01/01/2012 Document Reviewed: 03/10/2011 Elsevier Interactive Patient Education [employer].

## 2016-01-13 NOTE — Progress Notes (Signed)
Hematology and Oncology Follow Up Visit  Bianca Kent [id number] [birth year] 50 y.o. 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  Bianca Kent is back for followup . Apparently, she's been having issues. She began to have abdominal pain, to the office today. This is epigastric in location. It does not radiate. Nothing seemed to have triggered this.  Her sister, comes with her, says that she is not doing well. She is home most the time. She is in bed most the time. She really does not do all that much. She is not eating that much. She has lost some weight.  Ms. [name] knows that she is having some emotional issues. 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 She really enjoyed this.  I think she will needs to have somebody to talk to. She has a good relationship with one chaplains at the hospital. I encouraged her that she should try to get up with him again.  We did go ahead and do a CT scan of her abdomen. This shows some thickening in the ileum. As such, she may have some localized enteritis. This might be from the Xeloda.  She still has a lot of nausea. I am not sure how we can try to get rid of the nausea for her. She is literally on 4 or 5 different nausea medications.  I really think she has to take a break from treatment. So far, everything really looks quite good. The CT scan did not show any obvious malignancy. She does have a lesion in T10 but this is stable.  Last year was a very difficult year for her. She had an incredibly hard time after surgery for a ethmoid cyst.  She's gotten through her surgery for the right shoulder. This would does not  bother her that much.  Her CA 27.29 was 20.  Overall, her performance status is ECOG 1. .  Medications:  Current outpatient prescriptions:  .  albuterol (PROVENTIL HFA;VENTOLIN HFA) 108 (90 BASE) MCG/ACT inhaler, Inhale 2 puffs into the lungs every 4 (four) hours as needed for wheezing or shortness of breath., Disp: 2 Inhaler, Rfl: 6 .  ALPRAZolam (XANAX) 1 MG tablet, Take 1 tablet (1 mg total) by mouth 2 (two) times daily as needed for anxiety., Disp: 60 tablet, Rfl: 2 .  amoxicillin-clavulanate (AUGMENTIN) 875-125 MG tablet, Take 1 tablet by mouth 2 (two) times daily., Disp: 20 tablet, Rfl: 0 .  aspirin (ASPIRIN EC) 81 MG EC tablet, Take 81 mg by mouth daily. Swallow whole., Disp: , Rfl:  .  Bismuth Subsalicylate (PEPTO-BISMOL PO), Take 1 tablet by mouth as needed (upset stomach). , Disp: , Rfl:  .  calcium carbonate (TUMS - DOSED IN MG ELEMENTAL CALCIUM) 500 MG chewable tablet, Chew 2 tablets by mouth 2 (two) times daily., Disp: , Rfl:  .  diphenhydrAMINE (SOMINEX) 25 MG tablet, Take 100 mg by mouth at bedtime as needed for sleep. Pt states she is taking up to 200 mg Benadryl at night and still not sleeping, Disp: , Rfl:  .  docusate sodium (COLACE) 100 MG capsule, Take 1 capsule (100  mg total) by mouth 2 (two) times daily as needed for mild constipation or moderate constipation., Disp: 10 capsule, Rfl: 0 .  dronabinol (MARINOL) 10 MG capsule, Take 1 capsule (10 mg total) by mouth 3 (three) times daily before meals., Disp: 90 capsule, Rfl: 0 .  ergocalciferol (VITAMIN D2) 50000 UNITS capsule, Take 1 capsule (50,000 Units total) by mouth 2 (two) times a week., Disp: 8 capsule, Rfl: 6 .  fentaNYL (DURAGESIC - DOSED MCG/HR) 50 MCG/HR, Place 1 patch (50 mcg total) onto the skin every other day., Disp: 15 patch, Rfl: 0 .  HYDROcodone-acetaminophen (NORCO) 5-325 MG tablet, Take 1-2 tablets by mouth every 4 (four) hours as needed for moderate pain or severe pain., Disp: 40 tablet, Rfl: 0 .   HYDROmorphone (DILAUDID) 4 MG tablet, Take 1 tablet (4 mg total) by mouth every 4 (four) hours as needed for severe pain., Disp: 90 tablet, Rfl: 0 .  Lactulose SOLN, Take 2 TBLSP every 6 hours until bowel movement (Patient taking differently: Take 30 mLs by mouth every 6 (six) hours as needed. Take 2 TBLSP every 6 hours until bowel movement), Disp: 1000 mL, Rfl: 4 .  lidocaine (LIDODERM) 5 %, Place 1 patch onto the skin daily. Remove & Discard patch within 12 hours or as directed by MD, Disp: 20 patch, Rfl: 0 .  lidocaine (XYLOCAINE) 2 % solution, Use as directed 20 mLs in the mouth or throat as needed for mouth pain., Disp: 200 mL, Rfl: 2 .  lidocaine-prilocaine (EMLA) cream, Apply 1 application topically as needed., Disp: 30 g, Rfl: 6 .  loratadine-pseudoephedrine (CLARITIN-D 24-HOUR) 10-240 MG per 24 hr tablet, Take 1 tablet by mouth daily., Disp: , Rfl:  .  LORazepam (ATIVAN) 1 MG tablet, Place 1 tablet under the tongue, IF NEEDED, every 6 hours for nausea., Disp: 90 tablet, Rfl: 2 .  methylphenidate (RITALIN) 10 MG tablet, Take 1 tablet (10 mg total) by mouth 2 (two) times daily., Disp: 60 tablet, Rfl: 0 .  metoCLOPramide (REGLAN) 10 MG tablet, Take 1 tablet (10 mg total) by mouth every 6 (six) hours as needed for nausea., Disp: 120 tablet, Rfl: 8 .  metroNIDAZOLE (FLAGYL) 500 MG tablet, Take 1 tablet (500 mg total) by mouth 3 (three) times daily., Disp: 30 tablet, Rfl: 0 .  Multiple Vitamin (MULTIVITAMIN) tablet, Take 1 tablet by mouth daily., Disp: , Rfl:  .  naloxegol oxalate (MOVANTIK) 25 MG TABS tablet, Take 1 tablet (25 mg total) by mouth daily., Disp: 30 tablet, Rfl: 6 .  NONFORMULARY OR COMPOUNDED ITEM, Take 5 mLs by mouth as needed (irritation). DUKES MOUTHWASH  SWISH AND SWALLOW, Disp: , Rfl:  .  nystatin (MYCOSTATIN) powder, , Disp: , Rfl: 3 .  Nystatin POWD, 1 application by Does not apply route 2 (two) times daily., Disp: 1 Bottle, Rfl: 3 .  OLANZapine (ZYPREXA) 5 MG tablet, Take 1  tablet (5 mg total) by mouth at bedtime., Disp: 30 tablet, Rfl: 3 .  ondansetron (ZOFRAN) 8 MG tablet, Take 1 tablet (8 mg total) by mouth every 8 (eight) hours as needed for nausea or vomiting., Disp: 60 tablet, Rfl: 2 .  pantoprazole (PROTONIX) 40 MG tablet, Take 1 tablet (40 mg total) by mouth daily., Disp: 30 tablet, Rfl: 6 .  pilocarpine (SALAGEN) 5 MG tablet, Take 1 tablet (5 mg total) by mouth 2 (two) times daily., Disp: 60 tablet, Rfl: 3 .  prochlorperazine (COMPAZINE) 10 MG tablet, Take 1 tablet (10 mg total) by mouth  every 6 (six) hours as needed for nausea or vomiting., Disp: 60 tablet, Rfl: 2 .  promethazine (PHENERGAN) 25 MG suppository, Place 1 suppository (25 mg total) rectally every 6 (six) hours as needed for nausea or vomiting., Disp: 60 each, Rfl: 3 .  traZODone (DESYREL) 100 MG tablet, TAKE 1 TO 2 TABLETS BY MOUTH AS NEEDED FOR SLEEP, Disp: 60 tablet, Rfl: 2 No current facility-administered medications for this visit.  Facility-Administered Medications Ordered in Other Visits:  .  0.9 %  sodium chloride infusion, , Intravenous, Once, [name], MD .  Ampicillin-Sulbactam (UNASYN) 3 g in sodium chloride 0.9 % 100 mL IVPB, 3 g, Intravenous, Once, [name], MD, 3 g at 01/13/16 1657 .  heparin lock flush 100 unit/mL, 500 Units, Intracatheter, Once PRN, [name], MD .  sodium chloride 0.9 % injection 10 mL, 10 mL, Intracatheter, PRN, [name], MD  Allergies:  Allergies  Allergen Reactions  . Iohexol      Code: RASH, Desc: VERY STRONG FAMILY HX OF ANGIOEDEMA WHEN RECEIVING IV CONTRAST; PT HAS BEEN PREMEDICATED FOR OTHER CONTRASTED STUDIES(IN CATH. LAB)  KR, Onset Date: [id number]   . Prednisone Itching    Capillary beds bust  . Tetanus Toxoids Other (See Comments)    Ran a high fever for 48 hours  . Theophyllines Hives    Mental changes  . Versed [Midazolam] Other (See Comments)    Pt becomes violent    Past Medical History, Surgical history,  Social history, and Family History were reviewed and updated.  Review of Systems: As above  Physical Exam:  height is 5\' 7"  (1.702 m) and weight is 171 lb (77.565 kg). Her oral temperature is 97.7 F (36.5 C). Her blood pressure is 126/82 and her pulse is 83. Her respiration is 18.   Well-developed and well-nourished white female. Head and exam shows no ocular or oral lesions. She's able to move both eyes well. She has good extraocular muscle . There is no erythema or exudate from the left eye. There might be some slight fullness in the right axilla by do not detect any obvious adenopathy. I cannot detect any obvious right supraclavicular lymph nodes. She has improved range of motion of the right shoulder. Left supraclavicular region is okay. No masses are noted. Lungs are clear. Cardiac exam regular rate and rhythm with no murmurs, rubs or bruits.. Abdomen is soft. She has good bowel sounds. There is no palpable liver or spleen tip. Back exam shows decreased tenderness over the lumbar and thoracic spine to palpation. Extremities shows no clubbing, cyanosis or edema. She has 4/5 strength in her legs. Skin exam shows improvement in the radiation dermatitis of the right breast and axilla. There is no open wound. There is some hyper pigmentation. Neurological exam is nonfocal.    Lab Results  Component Value Date   WBC 7.2 01/13/2016   HGB 14.6 01/13/2016   HCT 42.4 01/13/2016   MCV 90 01/13/2016   PLT 110* 01/13/2016     Chemistry      Component Value Date/Time   NA 140 01/13/2016 0923   NA 143 11/08/2015 1047   NA 138 04/30/2015 1019   K 3.3 01/13/2016 0923   K 4.0 11/08/2015 1047   K 3.5 04/30/2015 1019   CL 106 01/13/2016 0923   CL 110 11/08/2015 1047   CO2 26 01/13/2016 0923   CO2 24 11/08/2015 1047   CO2 29 04/30/2015 1019   BUN 13  01/13/2016 0923   BUN 6 11/08/2015 1047   BUN 9.7 04/30/2015 1019   CREATININE 0.9 01/13/2016 0923   CREATININE 0.78 11/08/2015 1047    CREATININE 0.8 04/30/2015 1019      Component Value Date/Time   CALCIUM 9.1 01/13/2016 0923   CALCIUM 9.4 11/08/2015 1047   CALCIUM 9.7 04/30/2015 1019   ALKPHOS 156* 01/13/2016 0923   ALKPHOS 257* 04/30/2015 1019   ALKPHOS 238* 03/06/2015 0425   AST 46* 01/13/2016 0923   AST 57* 04/30/2015 1019   AST 41 03/06/2015 0425   ALT 69* 01/13/2016 0923   ALT 41 04/30/2015 1019   ALT 38 03/06/2015 0425   BILITOT 0.60 01/13/2016 0923   BILITOT 0.60 04/30/2015 1019   BILITOT 0.4 03/06/2015 0425         Impression and Plan: Ms. Mkrtchyan is 50 year old white female with triple positive metastatic breast cancer.  I just feel bad for her. She really has trying everything.  I just wonder if we might be good and justified in going back to hormonal therapy. This might be better tolerated by her. It has been a longtime since she's had hormonal therapy.  I just think that further chemotherapy is going to be a real problem with her try to it.  I don't think she ever has had tamoxifen. I don't think she's been on Afinitor. I do see if she's been on Ibrance.  Again, we will see how she does with antibiotics. I gave her a dose in the office. I'll have her on Augmentin and Flagyl as an outpatient.  I do feel bad that she has is nausea. Maybe we have to consider an upper GI for her. This might help [place] out if this nausea does not improve.  I spent about an hour with her today. As always, we had a very good prayer session.   I will like to see her back in 3-4 weeks.    [name], MD 3/23/20175:01 PM

## 2016-01-13 NOTE — Progress Notes (Signed)
12:42 PM Complain of abd pain, 10, orders received. 1245 PM  Transported to CT via WC accompanied by RN.   1:16 PM Back from CT, Pain decreased to 4.

## 2016-01-14 ENCOUNTER — Telehealth: Payer: Self-pay | Admitting: *Deleted

## 2016-01-14 LAB — VITAMIN D 25 HYDROXY (VIT D DEFICIENCY, FRACTURES): VIT D 25 HYDROXY: 16 ng/mL — AB (ref 30.0–100.0)

## 2016-01-14 NOTE — Telephone Encounter (Signed)
Called patient to follow up with her after her office visit yesterday. Patient's pain is better today. She has her new prescription for dilaudid available to take as she needs it. She feel much better, but still nauseated. She is able to eat and drink without difficulty. She knows to call the office if she has any issues before her next appointment.

## 2016-01-17 ENCOUNTER — Telehealth: Payer: Self-pay | Admitting: *Deleted

## 2016-01-17 DIAGNOSIS — M25511 Pain in right shoulder: Secondary | ICD-10-CM | POA: Diagnosis not present

## 2016-01-17 NOTE — Telephone Encounter (Addendum)
Spoke with Bianca Kent. She is aware of results.  [name] also asked about patient having diarrhea all weekend after taking multiple laxatives. Patient stopped taking her antibiotics thinking they would "just run through her" and would be pointless. Spoke with Dr [name] and he wants patient to continue medicine despite diarrhea.   Bianca Kent aware of all instructions from physician. She will talk to patient.   ----- Message from Eliezer Bottom, NP sent at 01/17/2016  8:44 AM EDT ----- Regarding: Vit D Please make sure she is taking her vit D. Her level is still quite low.   Bianca Kent  ----- Message -----    From: [facility]    Sent: 01/13/2016   9:40 AM      To: Eliezer Bottom, NP

## 2016-01-18 ENCOUNTER — Telehealth: Payer: Self-pay | Admitting: *Deleted

## 2016-01-18 NOTE — Telephone Encounter (Signed)
Received message from Dr. Cristina Gong office [facility]) stating they have left 2 messages with no call back yet about colonoscopy that was requested by dr. [name].  Message given to Dr. [name]

## 2016-01-19 ENCOUNTER — Other Ambulatory Visit: Payer: Self-pay | Admitting: *Deleted

## 2016-01-19 ENCOUNTER — Telehealth: Payer: Self-pay | Admitting: *Deleted

## 2016-01-19 DIAGNOSIS — B373 Candidiasis of vulva and vagina: Secondary | ICD-10-CM

## 2016-01-19 DIAGNOSIS — B3731 Acute candidiasis of vulva and vagina: Secondary | ICD-10-CM

## 2016-01-19 MED ORDER — FLUCONAZOLE 150 MG PO TABS: 150.0000 mg | ORAL_TABLET | Freq: Every day | ORAL | Status: DC

## 2016-01-19 MED FILL — FLUCONAZOLE 150 MG TABLET: 150 | 1 days supply | Qty: 1 | Fill #0

## 2016-01-19 NOTE — Telephone Encounter (Signed)
Sister notifying office that patient has a yeast infection after being on antibiotics. She would like office to send in prescription for Diflucan.  Spoke to Dr [name] and he will send prescription.

## 2016-01-20 DIAGNOSIS — J9601 Acute respiratory failure with hypoxia: Secondary | ICD-10-CM | POA: Diagnosis not present

## 2016-01-21 ENCOUNTER — Telehealth: Payer: Self-pay | Admitting: *Deleted

## 2016-01-21 NOTE — Telephone Encounter (Signed)
Patient's sister calling stating that she believes patient has not urinated for over 24h. She states the patient has gone to the bathroom every 45-60 minutes and states she can't go. When asked if she has urinated at all, the patient won't answer the question. Sister is also concerned with patient's abdominal pain becoming an issue each time she eats.   Called the patient and spoke with her. Patient denies any problems. She states she is having some abdominal pain, but states it's much better than it has been, and is only intermittent. She denies any urinary issues. Also asked her about getting in touch with her counselor Jeanella Craze. She states he is out of the office this week, but will attempt again next week when he's back.   Reviewed all the above with Dr [name]. No orders or treatment plan changes at this time. Robin aware. Instructed that if patient continues to show no signs of urination, or she displays bladder distention or pain to take her to the emergency room.

## 2016-01-24 DIAGNOSIS — R1084 Generalized abdominal pain: Secondary | ICD-10-CM | POA: Diagnosis not present

## 2016-01-24 DIAGNOSIS — R112 Nausea with vomiting, unspecified: Secondary | ICD-10-CM | POA: Diagnosis not present

## 2016-01-25 ENCOUNTER — Other Ambulatory Visit: Payer: Self-pay | Admitting: Gastroenterology

## 2016-01-25 ENCOUNTER — Ambulatory Visit (HOSPITAL_COMMUNITY)
Admission: RE | Admit: 2016-01-25 | Discharge: 2016-01-25 | Disposition: A | Discharge status: Home / Self Care | Payer: 59 | Source: Ambulatory Visit | Attending: Hematology & Oncology | Admitting: Hematology & Oncology

## 2016-01-25 DIAGNOSIS — R918 Other nonspecific abnormal finding of lung field: Secondary | ICD-10-CM | POA: Diagnosis not present

## 2016-01-25 DIAGNOSIS — R1013 Epigastric pain: Secondary | ICD-10-CM | POA: Insufficient documentation

## 2016-01-25 DIAGNOSIS — C50912 Malignant neoplasm of unspecified site of left female breast: Secondary | ICD-10-CM | POA: Diagnosis not present

## 2016-01-25 DIAGNOSIS — C787 Secondary malignant neoplasm of liver and intrahepatic bile duct: Secondary | ICD-10-CM | POA: Insufficient documentation

## 2016-01-25 DIAGNOSIS — A049 Bacterial intestinal infection, unspecified: Secondary | ICD-10-CM | POA: Diagnosis not present

## 2016-01-25 DIAGNOSIS — D508 Other iron deficiency anemias: Secondary | ICD-10-CM | POA: Insufficient documentation

## 2016-01-25 DIAGNOSIS — R112 Nausea with vomiting, unspecified: Secondary | ICD-10-CM

## 2016-01-25 DIAGNOSIS — C50919 Malignant neoplasm of unspecified site of unspecified female breast: Secondary | ICD-10-CM | POA: Diagnosis not present

## 2016-01-25 LAB — GLUCOSE, CAPILLARY: GLUCOSE-CAPILLARY: 110 mg/dL — AB (ref 65–99)

## 2016-01-25 MED ORDER — FLUDEOXYGLUCOSE F - 18 (FDG) INJECTION
8.8400 | Freq: Once | INTRAVENOUS | Status: AC | PRN
  Administered 2016-01-25: 8.84 via INTRAVENOUS

## 2016-01-27 ENCOUNTER — Other Ambulatory Visit: Payer: Self-pay | Admitting: Hematology & Oncology

## 2016-01-27 DIAGNOSIS — C50912 Malignant neoplasm of unspecified site of left female breast: Secondary | ICD-10-CM

## 2016-01-27 MED ORDER — PALBOCICLIB 100 MG PO CAPS: ORAL_CAPSULE | ORAL | Status: DC

## 2016-01-28 ENCOUNTER — Ambulatory Visit
Admission: RE | Admit: 2016-01-28 | Discharge: 2016-01-28 | Disposition: A | Discharge status: Home / Self Care | Payer: 59 | Source: Ambulatory Visit | Attending: Gastroenterology | Admitting: Gastroenterology

## 2016-01-28 ENCOUNTER — Telehealth: Payer: Self-pay | Admitting: *Deleted

## 2016-01-28 DIAGNOSIS — R112 Nausea with vomiting, unspecified: Secondary | ICD-10-CM | POA: Diagnosis not present

## 2016-01-28 NOTE — Telephone Encounter (Signed)
Prior Authorization for Bianca Kent denied.   Dr [name] aware. No further action desired.

## 2016-01-31 DIAGNOSIS — M25511 Pain in right shoulder: Secondary | ICD-10-CM | POA: Diagnosis not present

## 2016-02-07 ENCOUNTER — Encounter: Payer: Self-pay | Admitting: Hematology & Oncology

## 2016-02-07 ENCOUNTER — Ambulatory Visit (HOSPITAL_BASED_OUTPATIENT_CLINIC_OR_DEPARTMENT_OTHER): Payer: 59

## 2016-02-07 ENCOUNTER — Other Ambulatory Visit: Payer: Self-pay | Admitting: *Deleted

## 2016-02-07 ENCOUNTER — Other Ambulatory Visit (HOSPITAL_BASED_OUTPATIENT_CLINIC_OR_DEPARTMENT_OTHER): Payer: 59

## 2016-02-07 ENCOUNTER — Ambulatory Visit (HOSPITAL_BASED_OUTPATIENT_CLINIC_OR_DEPARTMENT_OTHER): Payer: 59 | Admitting: Hematology & Oncology

## 2016-02-07 VITALS — BP 110/61 | HR 85 | Temp 98.0°F | Resp 18 | Ht 67.0 in | Wt 172.0 lb

## 2016-02-07 DIAGNOSIS — C50919 Malignant neoplasm of unspecified site of unspecified female breast: Secondary | ICD-10-CM | POA: Diagnosis not present

## 2016-02-07 DIAGNOSIS — Z5111 Encounter for antineoplastic chemotherapy: Secondary | ICD-10-CM

## 2016-02-07 DIAGNOSIS — R1013 Epigastric pain: Secondary | ICD-10-CM | POA: Diagnosis not present

## 2016-02-07 DIAGNOSIS — C50911 Malignant neoplasm of unspecified site of right female breast: Secondary | ICD-10-CM | POA: Diagnosis not present

## 2016-02-07 DIAGNOSIS — C50912 Malignant neoplasm of unspecified site of left female breast: Secondary | ICD-10-CM

## 2016-02-07 DIAGNOSIS — C7951 Secondary malignant neoplasm of bone: Secondary | ICD-10-CM

## 2016-02-07 DIAGNOSIS — D508 Other iron deficiency anemias: Secondary | ICD-10-CM | POA: Diagnosis not present

## 2016-02-07 DIAGNOSIS — B3731 Acute candidiasis of vulva and vagina: Secondary | ICD-10-CM

## 2016-02-07 DIAGNOSIS — A049 Bacterial intestinal infection, unspecified: Secondary | ICD-10-CM | POA: Diagnosis not present

## 2016-02-07 DIAGNOSIS — B373 Candidiasis of vulva and vagina: Secondary | ICD-10-CM

## 2016-02-07 LAB — CMP (CANCER CENTER ONLY)
ALK PHOS: 194 U/L — AB (ref 26–84)
ALT: 23 U/L (ref 10–47)
AST: 28 U/L (ref 11–38)
Albumin: 3.5 g/dL (ref 3.3–5.5)
BILIRUBIN TOTAL: 0.7 mg/dL (ref 0.20–1.60)
BUN: 7 mg/dL (ref 7–22)
CO2: 31 mEq/L (ref 18–33)
CREATININE: 0.9 mg/dL (ref 0.6–1.2)
Calcium: 9 mg/dL (ref 8.0–10.3)
Chloride: 100 mEq/L (ref 98–108)
Glucose, Bld: 95 mg/dL (ref 73–118)
Potassium: 3.6 mEq/L (ref 3.3–4.7)
SODIUM: 138 meq/L (ref 128–145)
TOTAL PROTEIN: 7.5 g/dL (ref 6.4–8.1)

## 2016-02-07 LAB — URINALYSIS, MICROSCOPIC (CHCC SATELLITE)
GLUCOSE UR: NEGATIVE mg/dL
SPECIFIC GRAVITY, URINE: 1.025 (ref 1.003–1.035)
pH: 6 (ref 4.60–8.00)

## 2016-02-07 LAB — CBC WITH DIFFERENTIAL (CANCER CENTER ONLY)
BASO#: 0 10*3/uL (ref 0.0–0.2)
BASO%: 0 % (ref 0.0–2.0)
EOS%: 2.5 % (ref 0.0–7.0)
Eosinophils Absolute: 0.1 10*3/uL (ref 0.0–0.5)
HCT: 40.3 % (ref 34.8–46.6)
HGB: 13.3 g/dL (ref 11.6–15.9)
LYMPH#: 0.7 10*3/uL — ABNORMAL LOW (ref 0.9–3.3)
LYMPH%: 14.1 % (ref 14.0–48.0)
MCH: 30.6 pg (ref 26.0–34.0)
MCHC: 33 g/dL (ref 32.0–36.0)
MCV: 93 fL (ref 81–101)
MONO#: 0.5 10*3/uL (ref 0.1–0.9)
MONO%: 11.2 % (ref 0.0–13.0)
NEUT%: 72.2 % (ref 39.6–80.0)
NEUTROS ABS: 3.4 10*3/uL (ref 1.5–6.5)
PLATELETS: 114 10*3/uL — AB (ref 145–400)
RBC: 4.34 10*6/uL (ref 3.70–5.32)
RDW: 14.8 % (ref 11.1–15.7)
WBC: 4.8 10*3/uL (ref 3.9–10.0)

## 2016-02-07 LAB — FERRITIN: FERRITIN: 278 ng/mL — AB (ref 9–269)

## 2016-02-07 LAB — IRON AND TIBC
%SAT: 25 % (ref 21–57)
Iron: 44 ug/dL (ref 41–142)
TIBC: 179 ug/dL — ABNORMAL LOW (ref 236–444)
UIBC: 134 ug/dL (ref 120–384)

## 2016-02-07 MED ORDER — ZOLEDRONIC ACID 4 MG/100ML IV SOLN
4.0000 mg | Freq: Once | INTRAVENOUS | Status: AC
  Administered 2016-02-07: 4 mg via INTRAVENOUS
  Filled 2016-02-07: qty 100

## 2016-02-07 MED ORDER — ACETAMINOPHEN 325 MG PO TABS
ORAL_TABLET | ORAL | Status: AC
  Filled 2016-02-07: qty 2

## 2016-02-07 MED ORDER — SODIUM CHLORIDE 0.9 % IJ SOLN
10.0000 mL | INTRAMUSCULAR | Status: DC | PRN
  Administered 2016-02-07: 10 mL
  Filled 2016-02-07: qty 10

## 2016-02-07 MED ORDER — TRASTUZUMAB CHEMO INJECTION 440 MG
6.0000 mg/kg | Freq: Once | INTRAVENOUS | Status: AC
  Administered 2016-02-07: 483 mg via INTRAVENOUS
  Filled 2016-02-07: qty 23

## 2016-02-07 MED ORDER — ACETAMINOPHEN 325 MG PO TABS
650.0000 mg | ORAL_TABLET | Freq: Once | ORAL | Status: AC
  Administered 2016-02-07: 650 mg via ORAL

## 2016-02-07 MED ORDER — DIPHENHYDRAMINE HCL 25 MG PO CAPS
50.0000 mg | ORAL_CAPSULE | Freq: Once | ORAL | Status: AC
  Administered 2016-02-07: 50 mg via ORAL

## 2016-02-07 MED ORDER — MAGIC MOUTHWASH W/LIDOCAINE: 5.0000 mL | Freq: Four times a day (QID) | ORAL | Status: DC | PRN

## 2016-02-07 MED ORDER — HEPARIN SOD (PORK) LOCK FLUSH 100 UNIT/ML IV SOLN
500.0000 [IU] | Freq: Once | INTRAVENOUS | Status: AC | PRN
  Administered 2016-02-07: 500 [IU]
  Filled 2016-02-07: qty 5

## 2016-02-07 MED ORDER — SODIUM CHLORIDE 0.9 % IV SOLN
Freq: Once | INTRAVENOUS | Status: AC
  Administered 2016-02-07: 11:00:00 via INTRAVENOUS

## 2016-02-07 MED ORDER — FULVESTRANT 250 MG/5ML IM SOLN
500.0000 mg | Freq: Once | INTRAMUSCULAR | Status: AC
  Administered 2016-02-07: 500 mg via INTRAMUSCULAR

## 2016-02-07 MED ORDER — SODIUM CHLORIDE 0.9 % IV SOLN
420.0000 mg | Freq: Once | INTRAVENOUS | Status: AC
  Administered 2016-02-07: 420 mg via INTRAVENOUS
  Filled 2016-02-07: qty 14

## 2016-02-07 MED ORDER — FLUCONAZOLE 150 MG PO TABS: 150.0000 mg | ORAL_TABLET | Freq: Once | ORAL | Status: DC

## 2016-02-07 MED ORDER — SULFAMETHOXAZOLE-TRIMETHOPRIM 800-160 MG PO TABS: 1.0000 | ORAL_TABLET | Freq: Two times a day (BID) | ORAL | Status: DC

## 2016-02-07 MED ORDER — DIPHENHYDRAMINE HCL 25 MG PO CAPS
ORAL_CAPSULE | ORAL | Status: AC
  Filled 2016-02-07: qty 2

## 2016-02-07 MED FILL — FLUCONAZOLE 150 MG TABLET: 150 | 1 days supply | Qty: 1 | Fill #0

## 2016-02-07 MED FILL — SULFAMETHOXAZOLE-TMP DS TAB: 800-160 | 5 days supply | Qty: 10 | Fill #0

## 2016-02-07 NOTE — Patient Instructions (Signed)

## 2016-02-07 NOTE — Progress Notes (Signed)
Hematology and Oncology Follow Up Visit  KESHA MISKA [id number] 02-28-1966 50 y.o. 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  Deniston is back for followup . Unfortunately, she is progressing. We did get a PET scan on her. The PET scan clearly shows that she has progressive disease. She has new hepatic metastasis. She has new hilar adenopathy and meters so adenopathy.  Surprise, her CA 27.29 has not gone up. Her last level was 17.  She is this a little bit awake.  She is under a lot of stress. She still has nausea and vomiting. No matter what we tried on her, nothing has really worked for this.  She's had no headache. She has had the CNS metastasis that was treated.  She's had no diarrhea.  She's had no leg swelling.  His been no bleeding or bruising.  She has had some discomfort which is chronic over in the right axilla and lateral portion of the right breast.   She has had no increased cough. She is on chronic oxygen. She has a paralyzed right hemidiaphragm. This occurred after ENT surgery.   I had a very long talk with she and her sister. I think that we have to really consider quality of life. I do still think that further chemotherapy is really going to improve her quality of life. Since her cancer is triple positive, I we can add Faslodex to the regimen area and I will continue her on Herceptin/Perjeta.  We also talked quite a bit about end-of-life issues. Again I want to make sure that her quality of life is paramount. I want her to be able to enjoy what she can do. I think with Faslodex, we can do this.  We talked about getting hospice involved. They've one had to do much with her right now. However, they would know  about her.   Currently, her performance status is ECOG 2..  Medications:  Current outpatient prescriptions:  .  albuterol (PROVENTIL HFA;VENTOLIN HFA) 108 (90 BASE) MCG/ACT inhaler, Inhale 2 puffs into the lungs every 4 (four) hours as needed for wheezing or shortness of breath., Disp: 2 Inhaler, Rfl: 6 .  ALPRAZolam (XANAX) 1 MG tablet, Take 1 tablet (1 mg total) by mouth 2 (two) times daily as needed for anxiety., Disp: 60 tablet, Rfl: 2 .  aspirin (ASPIRIN EC) 81 MG EC tablet, Take 81 mg by mouth daily. Swallow whole., Disp: , Rfl:  .  Bismuth Subsalicylate (PEPTO-BISMOL PO), Take 1 tablet by mouth as needed (upset stomach). , Disp: , Rfl:  .  calcium carbonate (TUMS - DOSED IN MG ELEMENTAL CALCIUM) 500 MG chewable tablet, Chew 2 tablets by mouth 2 (two) times daily., Disp: , Rfl:  .  diphenhydrAMINE (SOMINEX) 25 MG tablet, Take 100 mg by mouth at bedtime as needed for sleep. Pt states she is taking up to 200 mg Benadryl at night and still not sleeping, Disp: , Rfl:  .  dronabinol (MARINOL) 10 MG capsule, Take 1 capsule (10 mg total) by mouth 3 (three) times daily before meals., Disp: 90 capsule, Rfl: 0 .  ergocalciferol (VITAMIN D2) 50000 UNITS capsule, Take 1 capsule (50,000 Units total) by mouth 2 (two) times a  week., Disp: 8 capsule, Rfl: 6 .  fentaNYL (DURAGESIC - DOSED MCG/HR) 50 MCG/HR, Place 1 patch (50 mcg total) onto the skin every other day., Disp: 15 patch, Rfl: 0 .  HYDROcodone-acetaminophen (NORCO) 5-325 MG tablet, Take 1-2 tablets by mouth every 4 (four) hours as needed for moderate pain or severe pain., Disp: 40 tablet, Rfl: 0 .  HYDROmorphone (DILAUDID) 4 MG tablet, Take 1 tablet (4 mg total) by mouth every 4 (four) hours as needed for severe pain., Disp: 90 tablet, Rfl: 0 .  Lactulose SOLN, Take 2 TBLSP every 6 hours until bowel movement (Patient taking differently: Take 30 mLs by mouth every 6 (six) hours as needed. Take 2 TBLSP every 6 hours until bowel movement), Disp: 1000  mL, Rfl: 4 .  lidocaine (LIDODERM) 5 %, Place 1 patch onto the skin daily. Remove & Discard patch within 12 hours or as directed by MD, Disp: 20 patch, Rfl: 0 .  lidocaine (XYLOCAINE) 2 % solution, Use as directed 20 mLs in the mouth or throat as needed for mouth pain., Disp: 200 mL, Rfl: 2 .  lidocaine-prilocaine (EMLA) cream, Apply 1 application topically as needed., Disp: 30 g, Rfl: 6 .  loratadine-pseudoephedrine (CLARITIN-D 24-HOUR) 10-240 MG per 24 hr tablet, Take 1 tablet by mouth daily., Disp: , Rfl:  .  LORazepam (ATIVAN) 1 MG tablet, Place 1 tablet under the tongue, IF NEEDED, every 6 hours for nausea., Disp: 90 tablet, Rfl: 2 .  methylphenidate (RITALIN) 10 MG tablet, Take 1 tablet (10 mg total) by mouth 2 (two) times daily., Disp: 60 tablet, Rfl: 0 .  metoCLOPramide (REGLAN) 10 MG tablet, Take 1 tablet (10 mg total) by mouth every 6 (six) hours as needed for nausea., Disp: 120 tablet, Rfl: 8 .  Multiple Vitamin (MULTIVITAMIN) tablet, Take 1 tablet by mouth daily., Disp: , Rfl:  .  naloxegol oxalate (MOVANTIK) 25 MG TABS tablet, Take 1 tablet (25 mg total) by mouth daily., Disp: 30 tablet, Rfl: 6 .  NONFORMULARY OR COMPOUNDED ITEM, Take 5 mLs by mouth as needed (irritation). DUKES MOUTHWASH  SWISH AND SWALLOW, Disp: , Rfl:  .  nystatin (MYCOSTATIN) powder, , Disp: , Rfl: 3 .  Nystatin POWD, 1 application by Does not apply route 2 (two) times daily., Disp: 1 Bottle, Rfl: 3 .  OLANZapine (ZYPREXA) 5 MG tablet, Take 1 tablet (5 mg total) by mouth at bedtime., Disp: 30 tablet, Rfl: 3 .  ondansetron (ZOFRAN) 8 MG tablet, Take 1 tablet (8 mg total) by mouth every 8 (eight) hours as needed for nausea or vomiting., Disp: 60 tablet, Rfl: 2 .  palbociclib (IBRANCE) 100 MG capsule, Take 1 capsule a day for 21 days, then off for 7 days.  Take whole with food., Disp: 21 capsule, Rfl: 6 .  pantoprazole (PROTONIX) 40 MG tablet, Take 1 tablet (40 mg total) by mouth daily., Disp: 30 tablet, Rfl: 6 .   pilocarpine (SALAGEN) 5 MG tablet, Take 1 tablet (5 mg total) by mouth 2 (two) times daily., Disp: 60 tablet, Rfl: 3 .  prochlorperazine (COMPAZINE) 10 MG tablet, Take 1 tablet (10 mg total) by mouth every 6 (six) hours as needed for nausea or vomiting., Disp: 60 tablet, Rfl: 2 .  promethazine (PHENERGAN) 25 MG suppository, Place 1 suppository (25 mg total) rectally every 6 (six) hours as needed for nausea or vomiting., Disp: 60 each, Rfl: 3 .  traZODone (DESYREL) 100 MG tablet, TAKE 1 TO 2 TABLETS BY MOUTH AS NEEDED FOR  SLEEP, Disp: 60 tablet, Rfl: 2 .  docusate sodium (COLACE) 100 MG capsule, Take 1 capsule (100 mg total) by mouth 2 (two) times daily as needed for mild constipation or moderate constipation., Disp: 10 capsule, Rfl: 0 .  fluconazole (DIFLUCAN) 150 MG tablet, Take 1 tablet (150 mg total) by mouth once., Disp: 1 tablet, Rfl: 3 .  magic mouthwash w/lidocaine SOLN, Take 5 mLs by mouth 4 (four) times daily as needed for mouth pain., Disp: 600 mL, Rfl: 4 .  sulfamethoxazole-trimethoprim (BACTRIM DS,SEPTRA DS) 800-160 MG tablet, Take 1 tablet by mouth 2 (two) times daily. For 5 days., Disp: 10 tablet, Rfl: 0 No current facility-administered medications for this visit.  Facility-Administered Medications Ordered in Other Visits:  .  sodium chloride 0.9 % injection 10 mL, 10 mL, Intracatheter, PRN, [name], MD, 10 mL at 02/07/16 1402  Allergies:  Allergies  Allergen Reactions  . Iohexol      Code: RASH, Desc: VERY STRONG FAMILY HX OF ANGIOEDEMA WHEN RECEIVING IV CONTRAST; PT HAS BEEN PREMEDICATED FOR OTHER CONTRASTED STUDIES(IN CATH. LAB)  KR, Onset Date: [id number]   . Prednisone Itching    Capillary beds bust  . Tetanus Toxoids Other (See Comments)    Ran a high fever for 48 hours  . Theophyllines Hives    Mental changes  . Versed [Midazolam] Other (See Comments)    Pt becomes violent    Past Medical History, Surgical history, Social history, and Family History were  reviewed and updated.  Review of Systems: As above  Physical Exam:  height is 5\' 7"  (1.702 m) and weight is 172 lb (78.019 kg). Her oral temperature is 98 F (36.7 C). Her blood pressure is 110/61 and her pulse is 85. Her respiration is 18 and oxygen saturation is 91%.   Well-developed and well-nourished white female. Head and exam shows no ocular or oral lesions. She's able to move both eyes well. She has good extraocular muscle . There is no erythema or exudate from the left eye. There might be some slight fullness in the right axilla by do not detect any obvious adenopathy. I cannot detect any obvious right supraclavicular lymph nodes. She has improved range of motion of the right shoulder. Left supraclavicular region is okay. No masses are noted. Lungs are clear. Cardiac exam regular rate and rhythm with no murmurs, rubs or bruits.. Abdomen is soft. She has good bowel sounds. There is no palpable liver or spleen tip. Back exam shows decreased tenderness over the lumbar and thoracic spine to palpation. Extremities shows no clubbing, cyanosis or edema. She has 4/5 strength in her legs. Skin exam shows improvement in the radiation dermatitis of the right breast and axilla. There is no open wound. There is some hyper pigmentation. Neurological exam is nonfocal.    Lab Results  Component Value Date   WBC 4.8 02/07/2016   HGB 13.3 02/07/2016   HCT 40.3 02/07/2016   MCV 93 02/07/2016   PLT 114* 02/07/2016     Chemistry      Component Value Date/Time   NA 138 02/07/2016 0942   NA 143 11/08/2015 1047   NA 138 04/30/2015 1019   K 3.6 02/07/2016 0942   K 4.0 11/08/2015 1047   K 3.5 04/30/2015 1019   CL 100 02/07/2016 0942   CL 110 11/08/2015 1047   CO2 31 02/07/2016 0942   CO2 24 11/08/2015 1047   CO2 29 04/30/2015 1019   BUN 7 02/07/2016 0942  BUN 6 11/08/2015 1047   BUN 9.7 04/30/2015 1019   CREATININE 0.9 02/07/2016 0942   CREATININE 0.78 11/08/2015 1047   CREATININE 0.8  04/30/2015 1019      Component Value Date/Time   CALCIUM 9.0 02/07/2016 0942   CALCIUM 9.4 11/08/2015 1047   CALCIUM 9.7 04/30/2015 1019   ALKPHOS 194* 02/07/2016 0942   ALKPHOS 257* 04/30/2015 1019   ALKPHOS 238* 03/06/2015 0425   AST 28 02/07/2016 0942   AST 57* 04/30/2015 1019   AST 41 03/06/2015 0425   ALT 23 02/07/2016 0942   ALT 41 04/30/2015 1019   ALT 38 03/06/2015 0425   BILITOT 0.70 02/07/2016 0942   BILITOT 0.60 04/30/2015 1019   BILITOT 0.4 03/06/2015 0425         Impression and Plan: Ms. Janey is 50 year old white female with triple positive metastatic breast cancer.  I just feel bad for her.   I know that she is doing everything that she can area did she actually looks a little better this time than when we last saw her. If she is complaining of some urinary issues. We did do a urinalysis. It looks like she may have a urinary tract infection. We will put her on Bactrim DS for 5 days.   She and her sisters are going to help had for a two-week vacation. This is in 2 weeks. We will have to see about given her the next dose of Faslodex before she goes down. I don't want her to miss going down for 2 weeks and enjoy herself.   I spent about an hour with she and her sister. Again, we talked about quality of life and end-of-life issues. I did not address CODE STATUS with her as of yet. I think this will come probably within the next couple months. She has always been very reasonable as to what she like to have done.   We will try to get her back here in 10 days and see how she is doing.   I will continue her on Zometa for right now.    [name], MD 4/17/20174:44 PM

## 2016-02-08 ENCOUNTER — Telehealth: Payer: Self-pay | Admitting: *Deleted

## 2016-02-08 LAB — CANCER ANTIGEN 27.29: CA 27.29: 22 U/mL (ref 0.0–38.6)

## 2016-02-08 NOTE — Telephone Encounter (Signed)
Spoke to [place] at [facility]. New patient referral information given.

## 2016-02-09 LAB — URINE CULTURE

## 2016-02-11 DIAGNOSIS — H5213 Myopia, bilateral: Secondary | ICD-10-CM | POA: Diagnosis not present

## 2016-02-11 MED FILL — SUCRALFATE 1 GM TABLET: 1 | 21 days supply | Qty: 84 | Fill #0

## 2016-02-17 ENCOUNTER — Encounter: Payer: Self-pay | Admitting: Hematology & Oncology

## 2016-02-17 ENCOUNTER — Telehealth: Payer: Self-pay | Admitting: *Deleted

## 2016-02-17 ENCOUNTER — Ambulatory Visit (HOSPITAL_BASED_OUTPATIENT_CLINIC_OR_DEPARTMENT_OTHER): Payer: 59 | Admitting: Hematology & Oncology

## 2016-02-17 ENCOUNTER — Other Ambulatory Visit (HOSPITAL_BASED_OUTPATIENT_CLINIC_OR_DEPARTMENT_OTHER): Payer: 59

## 2016-02-17 ENCOUNTER — Other Ambulatory Visit: Payer: Self-pay | Admitting: *Deleted

## 2016-02-17 ENCOUNTER — Ambulatory Visit (HOSPITAL_BASED_OUTPATIENT_CLINIC_OR_DEPARTMENT_OTHER): Payer: 59

## 2016-02-17 VITALS — BP 113/74 | HR 72 | Temp 98.2°F | Resp 16 | Ht 67.0 in | Wt 171.0 lb

## 2016-02-17 DIAGNOSIS — R5383 Other fatigue: Secondary | ICD-10-CM

## 2016-02-17 DIAGNOSIS — C50912 Malignant neoplasm of unspecified site of left female breast: Secondary | ICD-10-CM

## 2016-02-17 DIAGNOSIS — C7931 Secondary malignant neoplasm of brain: Secondary | ICD-10-CM

## 2016-02-17 DIAGNOSIS — Z170 Estrogen receptor positive status [ER+]: Secondary | ICD-10-CM

## 2016-02-17 DIAGNOSIS — J4530 Mild persistent asthma, uncomplicated: Secondary | ICD-10-CM

## 2016-02-17 DIAGNOSIS — G4701 Insomnia due to medical condition: Secondary | ICD-10-CM

## 2016-02-17 DIAGNOSIS — A049 Bacterial intestinal infection, unspecified: Secondary | ICD-10-CM | POA: Diagnosis not present

## 2016-02-17 DIAGNOSIS — R64 Cachexia: Secondary | ICD-10-CM

## 2016-02-17 DIAGNOSIS — E42 Marasmic kwashiorkor: Secondary | ICD-10-CM | POA: Diagnosis not present

## 2016-02-17 DIAGNOSIS — R112 Nausea with vomiting, unspecified: Secondary | ICD-10-CM

## 2016-02-17 DIAGNOSIS — C787 Secondary malignant neoplasm of liver and intrahepatic bile duct: Secondary | ICD-10-CM | POA: Diagnosis not present

## 2016-02-17 DIAGNOSIS — Z23 Encounter for immunization: Secondary | ICD-10-CM

## 2016-02-17 DIAGNOSIS — Z5111 Encounter for antineoplastic chemotherapy: Secondary | ICD-10-CM

## 2016-02-17 DIAGNOSIS — C50919 Malignant neoplasm of unspecified site of unspecified female breast: Secondary | ICD-10-CM

## 2016-02-17 DIAGNOSIS — C50911 Malignant neoplasm of unspecified site of right female breast: Secondary | ICD-10-CM

## 2016-02-17 DIAGNOSIS — R531 Weakness: Secondary | ICD-10-CM | POA: Diagnosis not present

## 2016-02-17 DIAGNOSIS — D508 Other iron deficiency anemias: Secondary | ICD-10-CM

## 2016-02-17 DIAGNOSIS — G4700 Insomnia, unspecified: Secondary | ICD-10-CM

## 2016-02-17 DIAGNOSIS — M792 Neuralgia and neuritis, unspecified: Secondary | ICD-10-CM

## 2016-02-17 LAB — CMP (CANCER CENTER ONLY)
ALT(SGPT): 26 U/L (ref 10–47)
AST: 39 U/L — AB (ref 11–38)
Albumin: 3.3 g/dL (ref 3.3–5.5)
Alkaline Phosphatase: 170 U/L — ABNORMAL HIGH (ref 26–84)
BUN, Bld: 8 mg/dL (ref 7–22)
CALCIUM: 9.2 mg/dL (ref 8.0–10.3)
CO2: 29 meq/L (ref 18–33)
Chloride: 104 mEq/L (ref 98–108)
Creat: 0.7 mg/dl (ref 0.6–1.2)
Glucose, Bld: 99 mg/dL (ref 73–118)
POTASSIUM: 3.4 meq/L (ref 3.3–4.7)
Sodium: 141 mEq/L (ref 128–145)
Total Bilirubin: 0.7 mg/dl (ref 0.20–1.60)
Total Protein: 7.3 g/dL (ref 6.4–8.1)

## 2016-02-17 LAB — CBC WITH DIFFERENTIAL (CANCER CENTER ONLY)
BASO#: 0 10*3/uL (ref 0.0–0.2)
BASO%: 0.2 % (ref 0.0–2.0)
EOS%: 2.3 % (ref 0.0–7.0)
Eosinophils Absolute: 0.1 10*3/uL (ref 0.0–0.5)
HEMATOCRIT: 37.5 % (ref 34.8–46.6)
HGB: 12.4 g/dL (ref 11.6–15.9)
LYMPH#: 0.8 10*3/uL — AB (ref 0.9–3.3)
LYMPH%: 15.9 % (ref 14.0–48.0)
MCH: 30.7 pg (ref 26.0–34.0)
MCHC: 33.1 g/dL (ref 32.0–36.0)
MCV: 93 fL (ref 81–101)
MONO#: 0.4 10*3/uL (ref 0.1–0.9)
MONO%: 8.4 % (ref 0.0–13.0)
NEUT%: 73.2 % (ref 39.6–80.0)
NEUTROS ABS: 3.5 10*3/uL (ref 1.5–6.5)
PLATELETS: 125 10*3/uL — AB (ref 145–400)
RBC: 4.04 10*6/uL (ref 3.70–5.32)
RDW: 14.7 % (ref 11.1–15.7)
WBC: 4.8 10*3/uL (ref 3.9–10.0)

## 2016-02-17 LAB — LACTATE DEHYDROGENASE: LDH: 167 U/L (ref 125–245)

## 2016-02-17 MED ORDER — OLANZAPINE 5 MG PO TABS: 5.0000 mg | ORAL_TABLET | Freq: Every day | ORAL | Status: DC

## 2016-02-17 MED ORDER — LORAZEPAM 1 MG PO TABS: ORAL_TABLET | ORAL | Status: AC

## 2016-02-17 MED ORDER — ONDANSETRON HCL 8 MG PO TABS: 8.0000 mg | ORAL_TABLET | Freq: Three times a day (TID) | ORAL | Status: DC | PRN

## 2016-02-17 MED ORDER — SODIUM CHLORIDE 0.9% FLUSH
10.0000 mL | INTRAVENOUS | Status: DC | PRN
  Administered 2016-02-17: 10 mL via INTRAVENOUS
  Filled 2016-02-17: qty 10

## 2016-02-17 MED ORDER — ALPRAZOLAM 1 MG PO TABS: 1.0000 mg | ORAL_TABLET | Freq: Two times a day (BID) | ORAL | Status: DC | PRN

## 2016-02-17 MED ORDER — FULVESTRANT 250 MG/5ML IM SOLN
500.0000 mg | Freq: Once | INTRAMUSCULAR | Status: AC
  Administered 2016-02-17: 500 mg via INTRAMUSCULAR

## 2016-02-17 MED ORDER — FENTANYL 50 MCG/HR TD PT72: 50.0000 ug | MEDICATED_PATCH | TRANSDERMAL | Status: DC

## 2016-02-17 MED ORDER — HEPARIN SOD (PORK) LOCK FLUSH 100 UNIT/ML IV SOLN
500.0000 [IU] | Freq: Once | INTRAVENOUS | Status: AC
  Administered 2016-02-17: 500 [IU] via INTRAVENOUS
  Filled 2016-02-17: qty 5

## 2016-02-17 MED ORDER — HYDROMORPHONE HCL 4 MG PO TABS: 4.0000 mg | ORAL_TABLET | ORAL | Status: DC | PRN

## 2016-02-17 MED ORDER — TRAZODONE HCL 100 MG PO TABS: ORAL_TABLET | ORAL | Status: DC

## 2016-02-17 MED ORDER — METHYLPHENIDATE HCL 10 MG PO TABS: 10.0000 mg | ORAL_TABLET | Freq: Two times a day (BID) | ORAL | Status: DC

## 2016-02-17 MED ORDER — FULVESTRANT 250 MG/5ML IM SOLN
INTRAMUSCULAR | Status: AC
  Filled 2016-02-17: qty 10

## 2016-02-17 MED FILL — OLANZapine 5 MG TABS: 5 | 30 days supply | Qty: 30 | Fill #0

## 2016-02-17 MED FILL — METHYLPHENIDATE 10 MG TAB: 10 | 30 days supply | Qty: 60 | Fill #0

## 2016-02-17 MED FILL — ONDANSETRON HCL 8 MG TABLET: 8 | 20 days supply | Qty: 60 | Fill #0

## 2016-02-17 MED FILL — HYDROmorphone HCL 4 MG TABS: 4 | 15 days supply | Qty: 90 | Fill #0

## 2016-02-17 MED FILL — traZODone HCL 100 MG TABS: 100 | 30 days supply | Qty: 60 | Fill #0

## 2016-02-17 MED FILL — fentaNYL 50 MCG/HR PT72: 50 | 30 days supply | Qty: 15 | Fill #0

## 2016-02-17 MED FILL — LORazepam 1 MG TABS: 1 | 23 days supply | Qty: 90 | Fill #0

## 2016-02-17 MED FILL — ALPRAZolam 1 MG TABS: 1 | 30 days supply | Qty: 60 | Fill #0

## 2016-02-17 NOTE — Patient Instructions (Signed)

## 2016-02-17 NOTE — Telephone Encounter (Signed)
Per [facility] office I have scheduled appts.They will call the office

## 2016-02-17 NOTE — Progress Notes (Signed)
Hematology and Oncology Follow Up Visit  Bianca Kent [id number] [birth year] 50 y.o. 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  Bianca Kent is back for followup . She now is on Faslodex. She's doing well with this. We are continuing the Herceptin/Perjeta.  She's having some dizziness. I'm not sure of this really is new. Her sister is worried about this. We will see back in an MRI of her brain. She has had a brain metastasis in the past. This was treated with surgery and radiation. Her last MRI of the brain was about 2 months ago.  Palliative care is involved right now. They will see palliative care this afternoon.  She is still planning on going to [place] for a two-week vacation. This sounds like a lot of fun. I think she will really enjoy this and benefit from being away from "it all"  She is having some more abdominal discomfort. This might be from her liver met. She's having more back discomfort. This might be from arthritis which is fairly bad.  She's really not having much discomfort in the right breast/axilla.  Her appetite might be a little better. Her tongue has been sore. Her throat has been sore.  She recently had a urinary tract infection. She was treated with Bactrim. This helped quite a bit.  Her last CA 27.29 was holding steady at 22.  She's had no bleeding. She's had no change in bowel or bladder habits.  Overall, her performance status is ECOG 2.  She still is on supplemental oxygen because of the paralyzed right hemidiaphragm.  Medications:  Current outpatient prescriptions:  .  albuterol (PROVENTIL HFA;VENTOLIN HFA) 108 (90 BASE) MCG/ACT inhaler, Inhale 2 puffs into the lungs every 4 (four) hours as  needed for wheezing or shortness of breath., Disp: 2 Inhaler, Rfl: 6 .  ALPRAZolam (XANAX) 1 MG tablet, Take 1 tablet (1 mg total) by mouth 2 (two) times daily as needed for anxiety., Disp: 60 tablet, Rfl: 2 .  aspirin (ASPIRIN EC) 81 MG EC tablet, Take 81 mg by mouth daily. Swallow whole., Disp: , Rfl:  .  Bismuth Subsalicylate (PEPTO-BISMOL PO), Take 1 tablet by mouth as needed (upset stomach). , Disp: , Rfl:  .  calcium carbonate (TUMS - DOSED IN MG ELEMENTAL CALCIUM) 500 MG chewable tablet, Chew 2 tablets by mouth 2 (two) times daily., Disp: , Rfl:  .  diphenhydrAMINE (SOMINEX) 25 MG tablet, Take 100 mg by mouth at bedtime as needed for sleep. Pt states she is taking up to 200 mg Benadryl at night and still not sleeping, Disp: , Rfl:  .  docusate sodium (COLACE) 100 MG capsule, Take 1 capsule (100 mg total) by mouth 2 (two) times daily as needed for mild constipation or moderate constipation., Disp: 10 capsule, Rfl: 0 .  dronabinol (MARINOL) 10 MG capsule, Take 1 capsule (10 mg total) by mouth 3 (three) times daily before meals., Disp: 90 capsule, Rfl: 0 .  ergocalciferol (VITAMIN D2) 50000 UNITS capsule, Take 1 capsule (50,000 Units total) by mouth 2 (two) times a week., Disp: 8 capsule, Rfl: 6 .  fentaNYL (DURAGESIC - DOSED MCG/HR) 50 MCG/HR, Place 1 patch (50 mcg total) onto the skin  every other day., Disp: 15 patch, Rfl: 0 .  fluconazole (DIFLUCAN) 150 MG tablet, Take 1 tablet (150 mg total) by mouth once., Disp: 1 tablet, Rfl: 3 .  HYDROcodone-acetaminophen (NORCO) 5-325 MG tablet, Take 1-2 tablets by mouth every 4 (four) hours as needed for moderate pain or severe pain., Disp: 40 tablet, Rfl: 0 .  HYDROmorphone (DILAUDID) 4 MG tablet, Take 1 tablet (4 mg total) by mouth every 4 (four) hours as needed for severe pain., Disp: 90 tablet, Rfl: 0 .  Lactulose SOLN, Take 2 TBLSP every 6 hours until bowel movement (Patient taking differently: Take 30 mLs by mouth every 6 (six) hours as needed. Take 2  TBLSP every 6 hours until bowel movement), Disp: 1000 mL, Rfl: 4 .  lidocaine (LIDODERM) 5 %, Place 1 patch onto the skin daily. Remove & Discard patch within 12 hours or as directed by MD, Disp: 20 patch, Rfl: 0 .  lidocaine (XYLOCAINE) 2 % solution, Use as directed 20 mLs in the mouth or throat as needed for mouth pain., Disp: 200 mL, Rfl: 2 .  lidocaine-prilocaine (EMLA) cream, Apply 1 application topically as needed., Disp: 30 g, Rfl: 6 .  loratadine-pseudoephedrine (CLARITIN-D 24-HOUR) 10-240 MG per 24 hr tablet, Take 1 tablet by mouth daily., Disp: , Rfl:  .  LORazepam (ATIVAN) 1 MG tablet, Place 1 tablet under the tongue, IF NEEDED, every 6 hours for nausea., Disp: 90 tablet, Rfl: 2 .  magic mouthwash w/lidocaine SOLN, Take 5 mLs by mouth 4 (four) times daily as needed for mouth pain., Disp: 600 mL, Rfl: 4 .  methylphenidate (RITALIN) 10 MG tablet, Take 1 tablet (10 mg total) by mouth 2 (two) times daily., Disp: 60 tablet, Rfl: 0 .  metoCLOPramide (REGLAN) 10 MG tablet, Take 1 tablet (10 mg total) by mouth every 6 (six) hours as needed for nausea., Disp: 120 tablet, Rfl: 8 .  Multiple Vitamin (MULTIVITAMIN) tablet, Take 1 tablet by mouth daily., Disp: , Rfl:  .  naloxegol oxalate (MOVANTIK) 25 MG TABS tablet, Take 1 tablet (25 mg total) by mouth daily., Disp: 30 tablet, Rfl: 6 .  NONFORMULARY OR COMPOUNDED ITEM, Take 5 mLs by mouth as needed (irritation). DUKES MOUTHWASH  SWISH AND SWALLOW, Disp: , Rfl:  .  nystatin (MYCOSTATIN) powder, , Disp: , Rfl: 3 .  Nystatin POWD, 1 application by Does not apply route 2 (two) times daily., Disp: 1 Bottle, Rfl: 3 .  OLANZapine (ZYPREXA) 5 MG tablet, Take 1 tablet (5 mg total) by mouth at bedtime., Disp: 30 tablet, Rfl: 3 .  ondansetron (ZOFRAN) 8 MG tablet, Take 1 tablet (8 mg total) by mouth every 8 (eight) hours as needed for nausea or vomiting., Disp: 60 tablet, Rfl: 2 .  palbociclib (IBRANCE) 100 MG capsule, Take 1 capsule a day for 21 days, then off  for 7 days.  Take whole with food., Disp: 21 capsule, Rfl: 6 .  pantoprazole (PROTONIX) 40 MG tablet, Take 1 tablet (40 mg total) by mouth daily., Disp: 30 tablet, Rfl: 6 .  pilocarpine (SALAGEN) 5 MG tablet, Take 1 tablet (5 mg total) by mouth 2 (two) times daily., Disp: 60 tablet, Rfl: 3 .  prochlorperazine (COMPAZINE) 10 MG tablet, Take 1 tablet (10 mg total) by mouth every 6 (six) hours as needed for nausea or vomiting., Disp: 60 tablet, Rfl: 2 .  promethazine (PHENERGAN) 25 MG suppository, Place 1 suppository (25 mg total) rectally every 6 (six) hours as needed for nausea or vomiting.,  Disp: 60 each, Rfl: 3 .  sucralfate (CARAFATE) 1 g tablet, Take 1 g by mouth daily., Disp: , Rfl: 1 .  sulfamethoxazole-trimethoprim (BACTRIM DS,SEPTRA DS) 800-160 MG tablet, Take 1 tablet by mouth 2 (two) times daily. For 5 days., Disp: 10 tablet, Rfl: 0 .  traZODone (DESYREL) 100 MG tablet, TAKE 1 TO 2 TABLETS BY MOUTH AS NEEDED FOR SLEEP, Disp: 60 tablet, Rfl: 2 No current facility-administered medications for this visit.  Facility-Administered Medications Ordered in Other Visits:  .  sodium chloride flush (NS) 0.9 % injection 10 mL, 10 mL, Intravenous, PRN, [name], MD, 10 mL at 02/17/16 1430  Allergies:  Allergies  Allergen Reactions  . Iohexol      Code: RASH, Desc: VERY STRONG FAMILY HX OF ANGIOEDEMA WHEN RECEIVING IV CONTRAST; PT HAS BEEN PREMEDICATED FOR OTHER CONTRASTED STUDIES(IN CATH. LAB)  KR, Onset Date: [id number]   . Prednisone Itching    Capillary beds bust  . Tetanus Toxoids Other (See Comments)    Ran a high fever for 48 hours  . Theophyllines Hives    Mental changes  . Versed [Midazolam] Other (See Comments)    Pt becomes violent    Past Medical History, Surgical history, Social history, and Family History were reviewed and updated.  Review of Systems: As above  Physical Exam:  height is '5\' 7"'  (1.702 m) and weight is 171 lb (77.565 kg). Her oral temperature is 98.2 F  (36.8 C). Her blood pressure is 113/74 and her pulse is 72. Her respiration is 16.   Well-developed and well-nourished white female. Head and exam shows no ocular or oral lesions. She's able to move both eyes well. She has good extraocular muscle . There is no erythema or exudate from the left eye. There might be some slight fullness in the right axilla by do not detect any obvious adenopathy. I cannot detect any obvious right supraclavicular lymph nodes. She has improved range of motion of the right shoulder. Left supraclavicular region is okay. No masses are noted. Lungs are clear. Cardiac exam regular rate and rhythm with no murmurs, rubs or bruits.. Abdomen is soft. She has good bowel sounds. There is no palpable liver or spleen tip. Back exam shows decreased tenderness over the lumbar and thoracic spine to palpation. Extremities shows no clubbing, cyanosis or edema. She has 4/5 strength in her legs. Skin exam shows improvement in the radiation dermatitis of the right breast and axilla. There is no open wound. There is some hyper pigmentation. Neurological exam is nonfocal.    Lab Results  Component Value Date   WBC 4.8 02/17/2016   HGB 12.4 02/17/2016   HCT 37.5 02/17/2016   MCV 93 02/17/2016   PLT 125* 02/17/2016     Chemistry      Component Value Date/Time   NA 141 02/17/2016 1134   NA 143 11/08/2015 1047   NA 138 04/30/2015 1019   K 3.4 02/17/2016 1134   K 4.0 11/08/2015 1047   K 3.5 04/30/2015 1019   CL 104 02/17/2016 1134   CL 110 11/08/2015 1047   CO2 29 02/17/2016 1134   CO2 24 11/08/2015 1047   CO2 29 04/30/2015 1019   BUN 8 02/17/2016 1134   BUN 6 11/08/2015 1047   BUN 9.7 04/30/2015 1019   CREATININE 0.7 02/17/2016 1134   CREATININE 0.78 11/08/2015 1047   CREATININE 0.8 04/30/2015 1019      Component Value Date/Time   CALCIUM 9.2 02/17/2016 1134  CALCIUM 9.4 11/08/2015 1047   CALCIUM 9.7 04/30/2015 1019   ALKPHOS 170* 02/17/2016 1134   ALKPHOS 257* 04/30/2015  1019   ALKPHOS 238* 03/06/2015 0425   AST 39* 02/17/2016 1134   AST 57* 04/30/2015 1019   AST 41 03/06/2015 0425   ALT 26 02/17/2016 1134   ALT 41 04/30/2015 1019   ALT 38 03/06/2015 0425   BILITOT 0.70 02/17/2016 1134   BILITOT 0.60 04/30/2015 1019   BILITOT 0.4 03/06/2015 0425         Impression and Plan: Ms. Bianca Kent is 50 year old white female with triple positive metastatic breast cancer.  I am glad that she is still will be going on the vacation down to [place]. She was leaving this weekend. They will be gone for 2 weeks.  I'm not sure why she has this dizziness. Surly would not hurt to get MRI of the brain. Since she has had a brain met before, I suppose that she couldn't have recurrence.  I will like to hope that the antiestrogen approach will help her. She really was at her "physical and" with respect to chemotherapy. May be, we can get some "milage" with antiestrogen therapy.  I just want to give her good quality of life.  We talked a lot about going to [facility] for consideration of a clinical trial .  I just am not sure that they would have anything to really offer her. They would have to probably do another biopsy. She really does not want to have an invasive procedure done. She really likes our office.  She really likes our staff and feels very comfortable with our nurses.  We will go ahead with the Faslodex today.  I will do the MRI of her brain when she gets back from her vacation.  She will see me back in one month. I spent about 45 minutes with she and her sister. As always, we had a very good prayer session.   I will continue her on Zometa for right now.    [name], MD 4/27/20172:42 PM

## 2016-02-18 LAB — CANCER ANTIGEN 27.29: CA 27.29: 20.4 U/mL (ref 0.0–38.6)

## 2016-02-18 LAB — PREALBUMIN: PREALBUMIN: 14 mg/dL (ref 12–34)

## 2016-02-18 MED FILL — fentaNYL 25 MCG/HR PT72: 25 | 30 days supply | Qty: 15 | Fill #0

## 2016-02-20 DIAGNOSIS — J9601 Acute respiratory failure with hypoxia: Secondary | ICD-10-CM | POA: Diagnosis not present

## 2016-03-02 ENCOUNTER — Ambulatory Visit: Payer: 59 | Admitting: Hematology & Oncology

## 2016-03-02 ENCOUNTER — Ambulatory Visit: Payer: 59

## 2016-03-02 ENCOUNTER — Other Ambulatory Visit: Payer: 59

## 2016-03-06 ENCOUNTER — Telehealth: Payer: Self-pay | Admitting: *Deleted

## 2016-03-06 ENCOUNTER — Encounter: Payer: Self-pay | Admitting: Oncology

## 2016-03-06 ENCOUNTER — Other Ambulatory Visit: Payer: Self-pay | Admitting: *Deleted

## 2016-03-06 DIAGNOSIS — C50912 Malignant neoplasm of unspecified site of left female breast: Secondary | ICD-10-CM

## 2016-03-06 MED ORDER — AMOXICILLIN-POT CLAVULANATE 875-125 MG PO TABS: 1.0000 | ORAL_TABLET | Freq: Two times a day (BID) | ORAL | Status: DC

## 2016-03-06 MED FILL — AMOX-CLAV 875-125 MG TABLET: 875-125 | 10 days supply | Qty: 20 | Fill #0

## 2016-03-06 NOTE — Progress Notes (Signed)
Spoke with [facility], per Dr. [name], ok to do 3T MRI.

## 2016-03-06 NOTE — Telephone Encounter (Signed)
Patient has a blister on her foot that has opened and drained. Its a little red and swollen. They have  He pallaitive care RN coming in the am and they can do an in person assessment, but they would like to know if patient should be on something before tomorrow.   Spoke with Dr [name] and he would like patient to start on Augmentin tonight. Sister notified of new prescription. She or the palliative RN will call office tomorrow if additional intervention is needed.

## 2016-03-06 NOTE — Telephone Encounter (Signed)
Manuela Schwartz from radiation called and moved her flush appt from 5/16 to 5/17. She will call the patient

## 2016-03-07 ENCOUNTER — Other Ambulatory Visit: Payer: Self-pay

## 2016-03-07 ENCOUNTER — Ambulatory Visit (HOSPITAL_COMMUNITY): Admission: RE | Admit: 2016-03-07 | Payer: 59 | Source: Ambulatory Visit

## 2016-03-07 ENCOUNTER — Ambulatory Visit (HOSPITAL_COMMUNITY): Payer: 59

## 2016-03-07 DIAGNOSIS — R52 Pain, unspecified: Secondary | ICD-10-CM | POA: Diagnosis not present

## 2016-03-07 DIAGNOSIS — Z95828 Presence of other vascular implants and grafts: Secondary | ICD-10-CM

## 2016-03-07 MED FILL — FLUCONAZOLE 150 MG TABLET: 150 | 1 days supply | Qty: 1 | Fill #1

## 2016-03-08 ENCOUNTER — Ambulatory Visit (HOSPITAL_BASED_OUTPATIENT_CLINIC_OR_DEPARTMENT_OTHER): Payer: 59

## 2016-03-08 ENCOUNTER — Ambulatory Visit
Admission: RE | Admit: 2016-03-08 | Discharge: 2016-03-08 | Disposition: A | Discharge status: Home / Self Care | Payer: 59 | Source: Ambulatory Visit | Attending: Hematology & Oncology | Admitting: Hematology & Oncology

## 2016-03-08 DIAGNOSIS — C50912 Malignant neoplasm of unspecified site of left female breast: Secondary | ICD-10-CM

## 2016-03-08 DIAGNOSIS — C787 Secondary malignant neoplasm of liver and intrahepatic bile duct: Secondary | ICD-10-CM

## 2016-03-08 DIAGNOSIS — Z452 Encounter for adjustment and management of vascular access device: Secondary | ICD-10-CM | POA: Diagnosis not present

## 2016-03-08 DIAGNOSIS — C50919 Malignant neoplasm of unspecified site of unspecified female breast: Secondary | ICD-10-CM | POA: Diagnosis not present

## 2016-03-08 DIAGNOSIS — C719 Malignant neoplasm of brain, unspecified: Secondary | ICD-10-CM | POA: Diagnosis not present

## 2016-03-08 DIAGNOSIS — Z95828 Presence of other vascular implants and grafts: Secondary | ICD-10-CM

## 2016-03-08 MED ORDER — HEPARIN SOD (PORK) LOCK FLUSH 100 UNIT/ML IV SOLN
500.0000 [IU] | Freq: Once | INTRAVENOUS | Status: AC | PRN
  Administered 2016-03-08: 500 [IU] via INTRAVENOUS
  Filled 2016-03-08: qty 5

## 2016-03-08 MED ORDER — GADOBENATE DIMEGLUMINE 529 MG/ML IV SOLN
16.0000 mL | Freq: Once | INTRAVENOUS | Status: AC | PRN
  Administered 2016-03-08: 16 mL via INTRAVENOUS

## 2016-03-08 MED ORDER — SODIUM CHLORIDE 0.9 % IJ SOLN
10.0000 mL | INTRAMUSCULAR | Status: DC | PRN
  Administered 2016-03-08: 10 mL via INTRAVENOUS
  Filled 2016-03-08: qty 10

## 2016-03-08 NOTE — Patient Instructions (Signed)

## 2016-03-08 NOTE — Addendum Note (Signed)
Addended by: Vassie Loll D on: 03/08/2016 03:24 PM   Modules accepted: Orders

## 2016-03-08 NOTE — Progress Notes (Signed)
Pt came to chemo room for to have port de-accessed after MRI appointment with [facility].

## 2016-03-09 ENCOUNTER — Other Ambulatory Visit (HOSPITAL_BASED_OUTPATIENT_CLINIC_OR_DEPARTMENT_OTHER): Payer: 59

## 2016-03-09 ENCOUNTER — Ambulatory Visit (HOSPITAL_BASED_OUTPATIENT_CLINIC_OR_DEPARTMENT_OTHER): Payer: 59 | Admitting: Hematology & Oncology

## 2016-03-09 ENCOUNTER — Encounter: Payer: Self-pay | Admitting: *Deleted

## 2016-03-09 ENCOUNTER — Ambulatory Visit (HOSPITAL_BASED_OUTPATIENT_CLINIC_OR_DEPARTMENT_OTHER): Payer: 59

## 2016-03-09 ENCOUNTER — Encounter: Payer: Self-pay | Admitting: Hematology & Oncology

## 2016-03-09 ENCOUNTER — Other Ambulatory Visit: Payer: Self-pay | Admitting: Family

## 2016-03-09 VITALS — BP 126/80 | HR 79 | Temp 98.0°F | Resp 16 | Ht 67.0 in | Wt 173.0 lb

## 2016-03-09 DIAGNOSIS — C50911 Malignant neoplasm of unspecified site of right female breast: Secondary | ICD-10-CM

## 2016-03-09 DIAGNOSIS — C50912 Malignant neoplasm of unspecified site of left female breast: Secondary | ICD-10-CM

## 2016-03-09 DIAGNOSIS — S90821A Blister (nonthermal), right foot, initial encounter: Secondary | ICD-10-CM | POA: Diagnosis not present

## 2016-03-09 DIAGNOSIS — Z5112 Encounter for antineoplastic immunotherapy: Secondary | ICD-10-CM | POA: Diagnosis not present

## 2016-03-09 DIAGNOSIS — Z5111 Encounter for antineoplastic chemotherapy: Secondary | ICD-10-CM | POA: Diagnosis not present

## 2016-03-09 DIAGNOSIS — D696 Thrombocytopenia, unspecified: Secondary | ICD-10-CM

## 2016-03-09 DIAGNOSIS — Z170 Estrogen receptor positive status [ER+]: Secondary | ICD-10-CM | POA: Diagnosis not present

## 2016-03-09 DIAGNOSIS — R350 Frequency of micturition: Secondary | ICD-10-CM

## 2016-03-09 DIAGNOSIS — C7931 Secondary malignant neoplasm of brain: Secondary | ICD-10-CM

## 2016-03-09 LAB — CMP (CANCER CENTER ONLY)
ALBUMIN: 3.5 g/dL (ref 3.3–5.5)
ALT(SGPT): 22 U/L (ref 10–47)
AST: 31 U/L (ref 11–38)
Alkaline Phosphatase: 144 U/L — ABNORMAL HIGH (ref 26–84)
BUN, Bld: 7 mg/dL (ref 7–22)
CALCIUM: 9.1 mg/dL (ref 8.0–10.3)
CHLORIDE: 102 meq/L (ref 98–108)
CO2: 27 meq/L (ref 18–33)
Creat: 0.7 mg/dl (ref 0.6–1.2)
Glucose, Bld: 96 mg/dL (ref 73–118)
POTASSIUM: 3.8 meq/L (ref 3.3–4.7)
SODIUM: 136 meq/L (ref 128–145)
Total Bilirubin: 0.6 mg/dl (ref 0.20–1.60)
Total Protein: 7.1 g/dL (ref 6.4–8.1)

## 2016-03-09 LAB — CBC WITH DIFFERENTIAL (CANCER CENTER ONLY)
BASO#: 0 10*3/uL (ref 0.0–0.2)
BASO%: 0.2 % (ref 0.0–2.0)
EOS%: 1.5 % (ref 0.0–7.0)
Eosinophils Absolute: 0.1 10*3/uL (ref 0.0–0.5)
HEMATOCRIT: 35 % (ref 34.8–46.6)
HEMOGLOBIN: 11.8 g/dL (ref 11.6–15.9)
LYMPH#: 0.6 10*3/uL — ABNORMAL LOW (ref 0.9–3.3)
LYMPH%: 11.8 % — ABNORMAL LOW (ref 14.0–48.0)
MCH: 30.6 pg (ref 26.0–34.0)
MCHC: 33.7 g/dL (ref 32.0–36.0)
MCV: 91 fL (ref 81–101)
MONO#: 0.3 10*3/uL (ref 0.1–0.9)
MONO%: 6.4 % (ref 0.0–13.0)
NEUT#: 3.9 10*3/uL (ref 1.5–6.5)
NEUT%: 80.1 % — ABNORMAL HIGH (ref 39.6–80.0)
Platelets: 91 10*3/uL — ABNORMAL LOW (ref 145–400)
RBC: 3.86 10*6/uL (ref 3.70–5.32)
RDW: 13.9 % (ref 11.1–15.7)
WBC: 4.8 10*3/uL (ref 3.9–10.0)

## 2016-03-09 LAB — LACTATE DEHYDROGENASE: LDH: 188 U/L (ref 125–245)

## 2016-03-09 MED ORDER — FULVESTRANT 250 MG/5ML IM SOLN
INTRAMUSCULAR | Status: AC
  Filled 2016-03-09: qty 10

## 2016-03-09 MED ORDER — HEPARIN SOD (PORK) LOCK FLUSH 100 UNIT/ML IV SOLN
500.0000 [IU] | Freq: Once | INTRAVENOUS | Status: AC | PRN
  Administered 2016-03-09: 500 [IU]
  Filled 2016-03-09: qty 5

## 2016-03-09 MED ORDER — ACETAMINOPHEN 325 MG PO TABS
650.0000 mg | ORAL_TABLET | Freq: Once | ORAL | Status: AC
  Administered 2016-03-09: 650 mg via ORAL

## 2016-03-09 MED ORDER — ACETAMINOPHEN 325 MG PO TABS
ORAL_TABLET | ORAL | Status: AC
  Filled 2016-03-09: qty 2

## 2016-03-09 MED ORDER — SODIUM CHLORIDE 0.9 % IV SOLN
6.0000 mg/kg | Freq: Once | INTRAVENOUS | Status: AC
  Administered 2016-03-09: 483 mg via INTRAVENOUS
  Filled 2016-03-09: qty 23

## 2016-03-09 MED ORDER — SODIUM CHLORIDE 0.9 % IJ SOLN
10.0000 mL | INTRAMUSCULAR | Status: DC | PRN
  Administered 2016-03-09: 10 mL
  Filled 2016-03-09: qty 10

## 2016-03-09 MED ORDER — DIPHENHYDRAMINE HCL 25 MG PO CAPS
50.0000 mg | ORAL_CAPSULE | Freq: Once | ORAL | Status: AC
  Administered 2016-03-09: 50 mg via ORAL

## 2016-03-09 MED ORDER — ZOLEDRONIC ACID 4 MG/100ML IV SOLN
4.0000 mg | Freq: Once | INTRAVENOUS | Status: AC
  Administered 2016-03-09: 4 mg via INTRAVENOUS
  Filled 2016-03-09: qty 100

## 2016-03-09 MED ORDER — DIPHENHYDRAMINE HCL 25 MG PO CAPS
ORAL_CAPSULE | ORAL | Status: AC
  Filled 2016-03-09: qty 2

## 2016-03-09 MED ORDER — SODIUM CHLORIDE 0.9 % IV SOLN
Freq: Once | INTRAVENOUS | Status: AC
Start: 2016-03-09 — End: 2016-03-09
  Administered 2016-03-09: 13:00:00 via INTRAVENOUS

## 2016-03-09 MED ORDER — FULVESTRANT 250 MG/5ML IM SOLN
500.0000 mg | Freq: Once | INTRAMUSCULAR | Status: AC
  Administered 2016-03-09: 500 mg via INTRAMUSCULAR

## 2016-03-09 MED ORDER — SODIUM CHLORIDE 0.9 % IV SOLN
420.0000 mg | Freq: Once | INTRAVENOUS | Status: AC
  Administered 2016-03-09: 420 mg via INTRAVENOUS
  Filled 2016-03-09: qty 14

## 2016-03-09 MED ORDER — AMPICILLIN-SULBACTAM SODIUM 3 (2-1) G IV SOLR
Freq: Once | INTRAVENOUS | Status: AC
  Administered 2016-03-09: 14:00:00 via INTRAVENOUS
  Filled 2016-03-09: qty 3

## 2016-03-09 MED ORDER — OXYBUTYNIN CHLORIDE 5 MG PO TABS: 5.0000 mg | ORAL_TABLET | Freq: Two times a day (BID) | ORAL | Status: DC

## 2016-03-09 MED FILL — OXYBUTYNIN 5 MG TABLET: 5 | 30 days supply | Qty: 60 | Fill #0

## 2016-03-09 NOTE — Progress Notes (Signed)
Hematology and Oncology Follow Up Visit  JAMILYA MANALILI AT:[id number] 1965-11-29 50 y.o. 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  Lacharite is back for followup . She now is on Faslodex. She's doing well with this. She did have some hip pain after the first couple shots. I don't think this is that unexpected.   We are continuing the Herceptin/Perjeta.  Thankfully, she had a good time down in [place]. Her family took on a 2 week vacation. She enjoyed herself. She did develop a blister on the back of her right heel. I'm unsure how she has is. I'll give her some IV Unasyn for this. I'll put her on some Augmentin.   Her breathing is doing about the same. She needs supplemental oxygen. 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 tite might be a little better. Her tongue has been sore. Her throat has been sore.  She recently had a urinary tract infection. She was treated with Bactrim. This helped quite a bit.  Her last CA 27.29 was holding steady at 20.  She's had no bleeding. She's had no change in bowel or bladder habits. She is now on some laxatives.  Palliative care is helping out quite a bit with her pain. She is on a fentanyl patch at 100 g. This seems to be helping her quite a bit.  Overall, her performance status is ECOG 2.    Medications:  Current outpatient prescriptions:  .  albuterol (PROVENTIL HFA;VENTOLIN HFA) 108 (90 BASE) MCG/ACT inhaler, Inhale 2 puffs into the lungs every 4 (four) hours as needed for wheezing or shortness of breath., Disp: 2 Inhaler, Rfl: 6 .  ALPRAZolam (XANAX) 1 MG tablet, Take 1 tablet (1 mg total) by mouth 2 (two) times daily as needed for anxiety., Disp: 60 tablet, Rfl: 2 .   amoxicillin-clavulanate (AUGMENTIN) 875-125 MG tablet, Take 1 tablet by mouth 2 (two) times daily. For 10 days, Disp: 20 tablet, Rfl: 0 .  aspirin (ASPIRIN EC) 81 MG EC tablet, Take 81 mg by mouth daily. Swallow whole., Disp: , Rfl:  .  Bismuth Subsalicylate (PEPTO-BISMOL PO), Take 1 tablet by mouth as needed (upset stomach). , Disp: , Rfl:  .  calcium carbonate (TUMS - DOSED IN MG ELEMENTAL CALCIUM) 500 MG chewable tablet, Chew 2 tablets by mouth 2 (two) times daily., Disp: , Rfl:  .  diphenhydrAMINE (SOMINEX) 25 MG tablet, Take 100 mg by mouth at bedtime as needed for sleep. Pt states she is taking up to 200 mg Benadryl at night and still not sleeping, Disp: , Rfl:  .  docusate sodium (COLACE) 100 MG capsule, Take 1 capsule (100 mg total) by mouth 2 (two) times daily as needed for mild constipation or moderate constipation., Disp: 10 capsule, Rfl: 0 .  dronabinol (MARINOL) 10 MG capsule, Take 1 capsule (10 mg total) by mouth 3 (three) times daily before meals., Disp: 90 capsule, Rfl: 0 .  ergocalciferol (VITAMIN D2) 50000 UNITS capsule, Take 1 capsule (50,000 Units total) by mouth 2 (two) times a week., Disp: 8 capsule, Rfl: 6 .  fentaNYL (DURAGESIC - DOSED MCG/HR) 50 MCG/HR, Place 1 patch (50 mcg total) onto the skin every other day., Disp: 15  patch, Rfl: 0 .  fluconazole (DIFLUCAN) 150 MG tablet, Take 1 tablet (150 mg total) by mouth once., Disp: 1 tablet, Rfl: 3 .  HYDROcodone-acetaminophen (NORCO) 5-325 MG tablet, Take 1-2 tablets by mouth every 4 (four) hours as needed for moderate pain or severe pain., Disp: 40 tablet, Rfl: 0 .  HYDROmorphone (DILAUDID) 4 MG tablet, Take 1 tablet (4 mg total) by mouth every 4 (four) hours as needed for severe pain., Disp: 90 tablet, Rfl: 0 .  Lactulose SOLN, Take 2 TBLSP every 6 hours until bowel movement (Patient taking differently: Take 30 mLs by mouth every 6 (six) hours as needed. Take 2 TBLSP every 6 hours until bowel movement), Disp: 1000 mL, Rfl: 4 .   lidocaine (LIDODERM) 5 %, Place 1 patch onto the skin daily. Remove & Discard patch within 12 hours or as directed by MD, Disp: 20 patch, Rfl: 0 .  lidocaine (XYLOCAINE) 2 % solution, Use as directed 20 mLs in the mouth or throat as needed for mouth pain., Disp: 200 mL, Rfl: 2 .  lidocaine-prilocaine (EMLA) cream, Apply 1 application topically as needed., Disp: 30 g, Rfl: 6 .  loratadine-pseudoephedrine (CLARITIN-D 24-HOUR) 10-240 MG per 24 hr tablet, Take 1 tablet by mouth daily., Disp: , Rfl:  .  LORazepam (ATIVAN) 1 MG tablet, Place 1 tablet under the tongue, IF NEEDED, every 6 hours for nausea., Disp: 90 tablet, Rfl: 2 .  magic mouthwash w/lidocaine SOLN, Take 5 mLs by mouth 4 (four) times daily as needed for mouth pain., Disp: 600 mL, Rfl: 4 .  methylphenidate (RITALIN) 10 MG tablet, Take 1 tablet (10 mg total) by mouth 2 (two) times daily., Disp: 60 tablet, Rfl: 0 .  metoCLOPramide (REGLAN) 10 MG tablet, Take 1 tablet (10 mg total) by mouth every 6 (six) hours as needed for nausea., Disp: 120 tablet, Rfl: 8 .  Multiple Vitamin (MULTIVITAMIN) tablet, Take 1 tablet by mouth daily., Disp: , Rfl:  .  naloxegol oxalate (MOVANTIK) 25 MG TABS tablet, Take 1 tablet (25 mg total) by mouth daily., Disp: 30 tablet, Rfl: 6 .  NONFORMULARY OR COMPOUNDED ITEM, Take 5 mLs by mouth as needed (irritation). DUKES MOUTHWASH  SWISH AND SWALLOW, Disp: , Rfl:  .  nystatin (MYCOSTATIN) powder, , Disp: , Rfl: 3 .  Nystatin POWD, 1 application by Does not apply route 2 (two) times daily., Disp: 1 Bottle, Rfl: 3 .  OLANZapine (ZYPREXA) 5 MG tablet, Take 1 tablet (5 mg total) by mouth at bedtime., Disp: 30 tablet, Rfl: 3 .  ondansetron (ZOFRAN) 8 MG tablet, Take 1 tablet (8 mg total) by mouth every 8 (eight) hours as needed for nausea or vomiting., Disp: 60 tablet, Rfl: 2 .  palbociclib (IBRANCE) 100 MG capsule, Take 1 capsule a day for 21 days, then off for 7 days.  Take whole with food., Disp: 21 capsule, Rfl: 6 .   pantoprazole (PROTONIX) 40 MG tablet, Take 1 tablet (40 mg total) by mouth daily., Disp: 30 tablet, Rfl: 6 .  pilocarpine (SALAGEN) 5 MG tablet, Take 1 tablet (5 mg total) by mouth 2 (two) times daily., Disp: 60 tablet, Rfl: 3 .  prochlorperazine (COMPAZINE) 10 MG tablet, Take 1 tablet (10 mg total) by mouth every 6 (six) hours as needed for nausea or vomiting., Disp: 60 tablet, Rfl: 2 .  promethazine (PHENERGAN) 25 MG suppository, Place 1 suppository (25 mg total) rectally every 6 (six) hours as needed for nausea or vomiting., Disp: 60 each, Rfl: 3 .  sucralfate (CARAFATE) 1 g tablet, Take 1 g by mouth daily., Disp: , Rfl: 1 .  sulfamethoxazole-trimethoprim (BACTRIM DS,SEPTRA DS) 800-160 MG tablet, Take 1 tablet by mouth 2 (two) times daily. For 5 days., Disp: 10 tablet, Rfl: 0 .  traZODone (DESYREL) 100 MG tablet, TAKE 1 TO 2 TABLETS BY MOUTH AS NEEDED FOR SLEEP, Disp: 60 tablet, Rfl: 2  Allergies:  Allergies  Allergen Reactions  . Iohexol      Code: RASH, Desc: VERY STRONG FAMILY HX OF ANGIOEDEMA WHEN RECEIVING IV CONTRAST; PT HAS BEEN PREMEDICATED FOR OTHER CONTRASTED STUDIES(IN CATH. LAB)  KR, Onset Date: [id number]   . Prednisone Itching    Capillary beds bust  . Tetanus Toxoids Other (See Comments)    Ran a high fever for 48 hours  . Theophyllines Hives    Mental changes  . Versed [Midazolam] Other (See Comments)    Pt becomes violent    Past Medical History, Surgical history, Social history, and Family History were reviewed and updated.  Review of Systems: As above  Physical Exam:  height is 5\' 7"  (1.702 m) and weight is 173 lb (78.472 kg). Her oral temperature is 98 F (36.7 C). Her blood pressure is 126/80 and her pulse is 79. Her respiration is 16.   Well-developed and well-nourished white female. Head and exam shows no ocular or oral lesions. She's able to move both eyes well. She has good extraocular muscle . There is no erythema or exudate from the left eye. There might  be some slight fullness in the right axilla by do not detect any obvious adenopathy. I cannot detect any obvious right supraclavicular lymph nodes. She has improved range of motion of the right shoulder. Left supraclavicular region is okay. No masses are noted. Lungs are clear. Cardiac exam regular rate and rhythm with no murmurs, rubs or bruits.. Abdomen is soft. She has good bowel sounds. There is no palpable liver or spleen tip. Back exam shows decreased tenderness over the lumbar and thoracic spine to palpation. Extremities shows no clubbing, cyanosis or edema. She has 4/5 strength in her legs. Skin exam shows improvement in the radiation dermatitis of the right breast and axilla. There is no open wound. There is some hyper pigmentation. Neurological exam is nonfocal.    Lab Results  Component Value Date   WBC 4.8 03/09/2016   HGB 11.8 03/09/2016   HCT 35.0 03/09/2016   MCV 91 03/09/2016   PLT 91* 03/09/2016     Chemistry      Component Value Date/Time   NA 136 03/09/2016 1057   NA 143 11/08/2015 1047   NA 138 04/30/2015 1019   K 3.8 03/09/2016 1057   K 4.0 11/08/2015 1047   K 3.5 04/30/2015 1019   CL 102 03/09/2016 1057   CL 110 11/08/2015 1047   CO2 27 03/09/2016 1057   CO2 24 11/08/2015 1047   CO2 29 04/30/2015 1019   BUN 7 03/09/2016 1057   BUN 6 11/08/2015 1047   BUN 9.7 04/30/2015 1019   CREATININE 0.7 03/09/2016 1057   CREATININE 0.78 11/08/2015 1047   CREATININE 0.8 04/30/2015 1019      Component Value Date/Time   CALCIUM 9.1 03/09/2016 1057   CALCIUM 9.4 11/08/2015 1047   CALCIUM 9.7 04/30/2015 1019   ALKPHOS 144* 03/09/2016 1057   ALKPHOS 257* 04/30/2015 1019   ALKPHOS 238* 03/06/2015 0425   AST 31 03/09/2016 1057   AST 57* 04/30/2015 1019   AST 41 03/06/2015  0425   ALT 22 03/09/2016 1057   ALT 41 04/30/2015 1019   ALT 38 03/06/2015 0425   BILITOT 0.60 03/09/2016 1057   BILITOT 0.60 04/30/2015 1019   BILITOT 0.4 03/06/2015 0425         Impression  and Plan: Ms. Bressan is 50 year old white female with triple positive metastatic breast cancer.  I Really feel that the Faslodex is helping her. Her alkaline phosphatase she's coming down. I think this is a good sign.   I'm glad that the MRI the brain looked okay without any evidence of metastatic disease.   We will continue the Herceptin and Perjeta.   Our main goals her quality of life. We talked about her driving. I told that she can drive locally but no more than 30 miles at a time. I just don't think she is ready to do this. She understands this.   I want to see her back in another month now area and hopefully, she will continue to improve with her performance status and be more functional.   As always, we had a really good prayer session. Her faith remains incredibly strong.   I spent about 30 minutes with she and her sister.   She will continue the Zometa.  [name], MD 5/18/201712:38 PM

## 2016-03-09 NOTE — Patient Instructions (Addendum)
Fulvestrant injection What is this medicine? FULVESTRANT (ful VES trant) blocks the effects of estrogen. It is used to treat breast cancer. This medicine may be used for other purposes; ask your health care provider or pharmacist if you have questions. What should I tell my health care provider before I take this medicine? They need to know if you have any of these conditions: -bleeding problems -liver disease -low levels of platelets in the blood -an unusual or allergic reaction to fulvestrant, other medicines, foods, dyes, or preservatives -pregnant or trying to get pregnant -breast-feeding How should I use this medicine? This medicine is for injection into a muscle. It is usually given by a health care professional in a hospital or clinic setting. Talk to your pediatrician regarding the use of this medicine in children. Special care may be needed. Overdosage: If you think you have taken too much of this medicine contact a poison control center or emergency room at once. NOTE: This medicine is only for you. Do not share this medicine with others. What if I miss a dose? It is important not to miss your dose. Call your doctor or health care professional if you are unable to keep an appointment. What may interact with this medicine? -medicines that treat or prevent blood clots like warfarin, enoxaparin, and dalteparin This list may not describe all possible interactions. Give your health care provider a list of all the medicines, herbs, non-prescription drugs, or dietary supplements you use. Also tell them if you smoke, drink alcohol, or use illegal drugs. Some items may interact with your medicine. What should I watch for while using this medicine? Your condition will be monitored carefully while you are receiving this medicine. You will need important blood work done while you are taking this medicine. Do not become pregnant while taking this medicine or for at least 1 year after stopping  it. Women of child-bearing potential will need to have a negative pregnancy test before starting this medicine. Women should inform their doctor if they wish to become pregnant or think they might be pregnant. There is a potential for serious side effects to an unborn child. Men should inform their doctors if they wish to father a child. This medicine may lower sperm counts. Talk to your health care professional or pharmacist for more information. Do not breast-feed an infant while taking this medicine or for 1 year after the last dose. What side effects may I notice from receiving this medicine? Side effects that you should report to your doctor or health care professional as soon as possible: -allergic reactions like skin rash, itching or hives, swelling of the face, lips, or tongue -feeling faint or lightheaded, falls -pain, tingling, numbness, or weakness in the legs -signs and symptoms of infection like fever or chills; cough; flu-like symptoms; sore throat -vaginal bleeding Side effects that usually do not require medical attention (report to your doctor or health care professional if they continue or are bothersome): -aches, pains -constipation -diarrhea -headache -hot flashes -nausea, vomiting -pain at site where injected -stomach pain This list may not describe all possible side effects. Call your doctor for medical advice about side effects. You may report side effects to FDA at 1-800-FDA-1088. Where should I keep my medicine? This drug is given in a hospital or clinic and will not be stored at home. NOTE: This sheet is a summary. It may not cover all possible information. If you have questions about this medicine, talk to your doctor, pharmacist, or health  care provider.    2016, [employer] Standard. (2015-05-07 11:03:55)     [facility] Discharge Instructions for Patients Receiving Chemotherapy  Today you received the following chemotherapy agents  Herceptin, Perjeta  To help prevent nausea and vomiting after your treatment, we encourage you to take your nausea medication    If you develop nausea and vomiting that is not controlled by your nausea medication, call the clinic.   BELOW ARE SYMPTOMS THAT SHOULD BE REPORTED IMMEDIATELY:  *FEVER GREATER THAN 100.5 F  *CHILLS WITH OR WITHOUT FEVER  NAUSEA AND VOMITING THAT IS NOT CONTROLLED WITH YOUR NAUSEA MEDICATION  *UNUSUAL SHORTNESS OF BREATH  *UNUSUAL BRUISING OR BLEEDING  TENDERNESS IN MOUTH AND THROAT WITH OR WITHOUT PRESENCE OF ULCERS  *URINARY PROBLEMS  *BOWEL PROBLEMS  UNUSUAL RASH Items with * indicate a potential emergency and should be followed up as soon as possible.  Feel free to call the clinic you have any questions or concerns. The clinic phone number is [phone].  Please show the [place] at check-in to the Emergency Department and triage nurse.  Zoledronic Acid injection (Hypercalcemia, Oncology) What is this medicine? ZOLEDRONIC ACID (ZOE le dron ik AS id) lowers the amount of calcium loss from bone. It is used to treat too much calcium in your blood from cancer. It is also used to prevent complications of cancer that has spread to the bone. This medicine may be used for other purposes; ask your health care provider or pharmacist if you have questions. What should I tell my health care provider before I take this medicine? They need to know if you have any of these conditions: -aspirin-sensitive asthma -cancer, especially if you are receiving medicines used to treat cancer -dental disease or wear dentures -infection -kidney disease -receiving corticosteroids like dexamethasone or prednisone -an unusual or allergic reaction to zoledronic acid, other medicines, foods, dyes, or preservatives -pregnant or trying to get pregnant -breast-feeding How should I use this medicine? This medicine is for infusion into a vein. It is given by a  health care professional in a hospital or clinic setting. Talk to your pediatrician regarding the use of this medicine in children. Special care may be needed. Overdosage: If you think you have taken too much of this medicine contact a poison control center or emergency room at once. NOTE: This medicine is only for you. Do not share this medicine with others. What if I miss a dose? It is important not to miss your dose. Call your doctor or health care professional if you are unable to keep an appointment. What may interact with this medicine? -certain antibiotics given by injection -NSAIDs, medicines for pain and inflammation, like ibuprofen or naproxen -some diuretics like bumetanide, furosemide -teriparatide -thalidomide This list may not describe all possible interactions. Give your health care provider a list of all the medicines, herbs, non-prescription drugs, or dietary supplements you use. Also tell them if you smoke, drink alcohol, or use illegal drugs. Some items may interact with your medicine. What should I watch for while using this medicine? Visit your doctor or health care professional for regular checkups. It may be some time before you see the benefit from this medicine. Do not stop taking your medicine unless your doctor tells you to. Your doctor may order blood tests or other tests to see how you are doing. Women should inform their doctor if they wish to become pregnant or think they might be pregnant. There is a potential  for serious side effects to an unborn child. Talk to your health care professional or pharmacist for more information. You should make sure that you get enough calcium and vitamin D while you are taking this medicine. Discuss the foods you eat and the vitamins you take with your health care professional. Some people who take this medicine have severe bone, joint, and/or muscle pain. This medicine may also increase your risk for jaw problems or a broken thigh  bone. Tell your doctor right away if you have severe pain in your jaw, bones, joints, or muscles. Tell your doctor if you have any pain that does not go away or that gets worse. Tell your dentist and dental surgeon that you are taking this medicine. You should not have major dental surgery while on this medicine. See your dentist to have a dental exam and fix any dental problems before starting this medicine. Take good care of your teeth while on this medicine. Make sure you see your dentist for regular follow-up appointments. What side effects may I notice from receiving this medicine? Side effects that you should report to your doctor or health care professional as soon as possible: -allergic reactions like skin rash, itching or hives, swelling of the face, lips, or tongue -anxiety, confusion, or depression -breathing problems -changes in vision -eye pain -feeling faint or lightheaded, falls -jaw pain, especially after dental work -mouth sores -muscle cramps, stiffness, or weakness -redness, blistering, peeling or loosening of the skin, including inside the mouth -trouble passing urine or change in the amount of urine Side effects that usually do not require medical attention (report to your doctor or health care professional if they continue or are bothersome): -bone, joint, or muscle pain -constipation -diarrhea -fever -hair loss -irritation at site where injected -loss of appetite -nausea, vomiting -stomach upset -trouble sleeping -trouble swallowing -weak or tired This list may not describe all possible side effects. Call your doctor for medical advice about side effects. You may report side effects to FDA at 1-800-FDA-1088. Where should I keep my medicine? This drug is given in a hospital or clinic and will not be stored at home. NOTE: This sheet is a summary. It may not cover all possible information. If you have questions about this medicine, talk to your doctor, pharmacist,  or health care provider.    2016, Else[employer] Standard. (2014-03-07 14:19:39) Ampicillin; Sulbactam injection What is this medicine? AMPICILLIN; SULBACTAM (am pi SILL in; sul BAK tam) is a penicillin antibiotic. It is used to treat certain kinds of bacterial infections. It will not work for colds, flu, or other viral infections. This medicine may be used for other purposes; ask your health care provider or pharmacist if you have questions. What should I tell my health care provider before I take this medicine? They need to know if you have any of these conditions: -heart disease -kidney disease -mononucleosis -an unusual or allergic reaction to ampicillin, other penicillins or antibiotics, foods, dyes, or preservatives -pregnant or trying to get pregnant -breast-feeding How should I use this medicine? This medicine is infused into a vein or injected deep into a muscle. It is usually given by a health care professional in a hospital or clinic setting. If you get this medicine at home, you will be taught how to prepare and give this medicine. Use exactly as directed. Take your medicine at regular intervals. Do not take your medicine more often than directed. It is important that you put your used needles and syringes  in a special sharps container. Do not put them in a trash can. If you do not have a sharps container, call your pharmacist or healthcare provider to get one. Talk to your pediatrician regarding the use of this medicine in children. Special care may be needed. Overdosage: If you think you have taken too much of this medicine contact a poison control center or emergency room at once. NOTE: This medicine is only for you. Do not share this medicine with others. What if I miss a dose? If you miss a dose, take it as soon as you can. If it is almost time for your next dose, take only that dose. Do not take double or extra doses. What may interact with this  medicine? -allopurinol -female hormones, including contraceptive or birth control pills -probenecid -some other antibiotics given by injection This list may not describe all possible interactions. Give your health care provider a list of all the medicines, herbs, non-prescription drugs, or dietary supplements you use. Also tell them if you smoke, drink alcohol, or use illegal drugs. Some items may interact with your medicine. What should I watch for while using this medicine? Tell your doctor or health care professional if your symptoms do not improve or if you get new symptoms. Do not treat diarrhea with over the counter products. Contact your doctor if you have diarrhea that lasts more than 2 days or if the diarrhea is severe and watery. This medicine can interfere with some urine glucose tests. If you use such tests, talk with your health care professional. Birth control pills may not work properly while you are taking this medicine. Talk to your doctor about using an extra method of birth control. What side effects may I notice from receiving this medicine? Side effects that you should report to your doctor or health care professional as soon as possible: -allergic reactions like skin rash or hives, swelling of the face, lips, or tongue -chest pain -difficulty breathing -fever, chills -pain or difficulty passing urine -redness, blistering, peeling or loosening of the skin, including inside the mouth -seizures -unusual bleeding, bruising -unusually weak or tired Side effects that usually do not require medical attention (report to your doctor or health care professional if they continue or are bothersome): -diarrhea -headache -heartburn -nausea, vomiting -pain, irritation at the site of injection -sore mouth, tongue -stomach gas This list may not describe all possible side effects. Call your doctor for medical advice about side effects. You may report side effects to FDA at  1-800-FDA-1088. Where should I keep my medicine? Keep out of the reach of children. You will be instructed on how to store this medicine. Throw away any unused medicine after the expiration date on the label. NOTE: This sheet is a summary. It may not cover all possible information. If you have questions about this medicine, talk to your doctor, pharmacist, or health care provider.    2016, Elsevier/Gold Standard. (2007-12-31 14:37:17) Trastuzumab injection for infusion What is this medicine? TRASTUZUMAB (tras TOO zoo mab) is a monoclonal antibody. It is used to treat breast cancer and stomach cancer. This medicine may be used for other purposes; ask your health care provider or pharmacist if you have questions. What should I tell my health care provider before I take this medicine? They need to know if you have any of these conditions: -heart disease -heart failure -infection (especially a virus infection such as chickenpox, cold sores, or herpes) -lung or breathing disease, like asthma -recent or ongoing  radiation therapy -an unusual or allergic reaction to trastuzumab, benzyl alcohol, or other medications, foods, dyes, or preservatives -pregnant or trying to get pregnant -breast-feeding How should I use this medicine? This drug is given as an infusion into a vein. It is administered in a hospital or clinic by a specially trained health care professional. Talk to your pediatrician regarding the use of this medicine in children. This medicine is not approved for use in children. Overdosage: If you think you have taken too much of this medicine contact a poison control center or emergency room at once. NOTE: This medicine is only for you. Do not share this medicine with others. What if I miss a dose? It is important not to miss a dose. Call your doctor or health care professional if you are unable to keep an appointment. What may interact with this medicine? -doxorubicin -warfarin This  list may not describe all possible interactions. Give your health care provider a list of all the medicines, herbs, non-prescription drugs, or dietary supplements you use. Also tell them if you smoke, drink alcohol, or use illegal drugs. Some items may interact with your medicine. What should I watch for while using this medicine? Visit your doctor for checks on your progress. Report any side effects. Continue your course of treatment even though you feel ill unless your doctor tells you to stop. Call your doctor or health care professional for advice if you get a fever, chills or sore throat, or other symptoms of a cold or flu. Do not treat yourself. Try to avoid being around people who are sick. You may experience fever, chills and shaking during your first infusion. These effects are usually mild and can be treated with other medicines. Report any side effects during the infusion to your health care professional. Fever and chills usually do not happen with later infusions. Do not become pregnant while taking this medicine or for 7 months after stopping it. Women should inform their doctor if they wish to become pregnant or think they might be pregnant. Women of child-bearing potential will need to have a negative pregnancy test before starting this medicine. There is a potential for serious side effects to an unborn child. Talk to your health care professional or pharmacist for more information. Do not breast-feed an infant while taking this medicine or for 7 months after stopping it. Women must use effective birth control with this medicine. What side effects may I notice from receiving this medicine? Side effects that you should report to your doctor or other health care professional as soon as possible: -breathing difficulties -chest pain or palpitations -cough -dizziness or fainting -fever or chills, sore throat -skin rash, itching or hives -swelling of the legs or ankles -unusually weak or  tired Side effects that usually do not require medical attention (report to your doctor or other health care professional if they continue or are bothersome): -loss of appetite -headache -muscle aches -nausea This list may not describe all possible side effects. Call your doctor for medical advice about side effects. You may report side effects to FDA at 1-800-FDA-1088. Where should I keep my medicine? This drug is given in a hospital or clinic and will not be stored at home. NOTE: This sheet is a summary. It may not cover all possible information. If you have questions about this medicine, talk to your doctor, pharmacist, or health care provider.    2016, [employer] Standard. (2015-01-15 11:49:32) Pertuzumab injection What is this medicine? PERTUZUMAB (per TOOZ  ue mab) is a monoclonal antibody. It is used to treat breast cancer. This medicine may be used for other purposes; ask your health care provider or pharmacist if you have questions. What should I tell my health care provider before I take this medicine? They need to know if you have any of these conditions: -heart disease -heart failure -high blood pressure -history of irregular heart beat -recent or ongoing radiation therapy -an unusual or allergic reaction to pertuzumab, other medicines, foods, dyes, or preservatives -pregnant or trying to get pregnant -breast-feeding How should I use this medicine? This medicine is for infusion into a vein. It is given by a health care professional in a hospital or clinic setting. Talk to your pediatrician regarding the use of this medicine in children. Special care may be needed. Overdosage: If you think you have taken too much of this medicine contact a poison control center or emergency room at once. NOTE: This medicine is only for you. Do not share this medicine with others. What if I miss a dose? It is important not to miss your dose. Call your doctor or health care professional if  you are unable to keep an appointment. What may interact with this medicine? Interactions are not expected. Give your health care provider a list of all the medicines, herbs, non-prescription drugs, or dietary supplements you use. Also tell them if you smoke, drink alcohol, or use illegal drugs. Some items may interact with your medicine. This list may not describe all possible interactions. Give your health care provider a list of all the medicines, herbs, non-prescription drugs, or dietary supplements you use. Also tell them if you smoke, drink alcohol, or use illegal drugs. Some items may interact with your medicine. What should I watch for while using this medicine? Your condition will be monitored carefully while you are receiving this medicine. Report any side effects. Continue your course of treatment even though you feel ill unless your doctor tells you to stop. Do not become pregnant while taking this medicine or for 7 months after stopping it. Women should inform their doctor if they wish to become pregnant or think they might be pregnant. Women of child-bearing potential will need to have a negative pregnancy test before starting this medicine. There is a potential for serious side effects to an unborn child. Talk to your health care professional or pharmacist for more information. Do not breast-feed an infant while taking this medicine or for 7 months after stopping it. Women must use effective birth control with this medicine. Call your doctor or health care professional for advice if you get a fever, chills or sore throat, or other symptoms of a cold or flu. Do not treat yourself. Try to avoid being around people who are sick. You may experience fever, chills, and headache during the infusion. Report any side effects during the infusion to your health care professional. What side effects may I notice from receiving this medicine? Side effects that you should report to your doctor or health  care professional as soon as possible: -breathing problems -chest pain or palpitations -dizziness -feeling faint or lightheaded -fever or chills -skin rash, itching or hives -sore throat -swelling of the face, lips, or tongue -swelling of the legs or ankles -unusually weak or tired Side effects that usually do not require medical attention (Report these to your doctor or health care professional if they continue or are bothersome.): -diarrhea -hair loss -nausea, vomiting -tiredness This list may not describe all possible  side effects. Call your doctor for medical advice about side effects. You may report side effects to FDA at 1-800-FDA-1088. Where should I keep my medicine? This drug is given in a hospital or clinic and will not be stored at home. NOTE: This sheet is a summary. It may not cover all possible information. If you have questions about this medicine, talk to your doctor, pharmacist, or health care provider.    2016, [employer] Standard. (2015-01-15 16:07:57)

## 2016-03-10 ENCOUNTER — Other Ambulatory Visit: Payer: Self-pay | Admitting: *Deleted

## 2016-03-13 ENCOUNTER — Other Ambulatory Visit: Payer: Self-pay | Admitting: Family

## 2016-03-13 ENCOUNTER — Ambulatory Visit
Admission: RE | Admit: 2016-03-13 | Discharge: 2016-03-13 | Disposition: A | Discharge status: Home / Self Care | Payer: 59 | Source: Ambulatory Visit | Attending: Radiation Oncology | Admitting: Radiation Oncology

## 2016-03-13 VITALS — BP 100/65 | HR 83 | Temp 97.7°F | Resp 20 | Ht 67.0 in | Wt 172.7 lb

## 2016-03-13 DIAGNOSIS — R5383 Other fatigue: Secondary | ICD-10-CM

## 2016-03-13 DIAGNOSIS — Z7982 Long term (current) use of aspirin: Secondary | ICD-10-CM | POA: Insufficient documentation

## 2016-03-13 DIAGNOSIS — C7931 Secondary malignant neoplasm of brain: Secondary | ICD-10-CM | POA: Diagnosis not present

## 2016-03-13 DIAGNOSIS — C787 Secondary malignant neoplasm of liver and intrahepatic bile duct: Secondary | ICD-10-CM | POA: Insufficient documentation

## 2016-03-13 DIAGNOSIS — C50911 Malignant neoplasm of unspecified site of right female breast: Secondary | ICD-10-CM

## 2016-03-13 DIAGNOSIS — R112 Nausea with vomiting, unspecified: Secondary | ICD-10-CM

## 2016-03-13 DIAGNOSIS — C801 Malignant (primary) neoplasm, unspecified: Secondary | ICD-10-CM | POA: Diagnosis not present

## 2016-03-13 DIAGNOSIS — G4701 Insomnia due to medical condition: Secondary | ICD-10-CM

## 2016-03-13 DIAGNOSIS — C50912 Malignant neoplasm of unspecified site of left female breast: Secondary | ICD-10-CM

## 2016-03-13 DIAGNOSIS — R64 Cachexia: Secondary | ICD-10-CM

## 2016-03-13 MED ORDER — FENTANYL 100 MCG/HR TD PT72: 100.0000 ug | MEDICATED_PATCH | TRANSDERMAL | Status: DC

## 2016-03-13 MED FILL — fentaNYL 100 MCG/HR PT72: 100 | 30 days supply | Qty: 15 | Fill #0

## 2016-03-13 NOTE — Progress Notes (Addendum)
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  She denies any headaches, blurred vision, nor changes in fine motor movement  She denies feeling off balance at this time.  Reports level 6 pain all over. Continues on [name] under the care of Dr. Burney Gauze.  Now under the care of [facility]ospice [facility] Pall[facility]- Social Worker involved

## 2016-03-13 NOTE — Progress Notes (Signed)
Radiation Oncology         [phone] ________________________________  Name: [name] MRN: [id number]  Date: 03/13/2016  DOB: Mar 04, 1966  Follow-Up Visit Note  Outpatient  CC: [name], MD  [name], MD  Diagnosis and Prior Radiotherapy:    ICD-9-CM ICD-10-CM   1. Brain metastasis ([facility]) 198.3 C79.31      Brain metastasis, Grade III triple positive breast cancer   Indication for treatment:  palliative       Radiation treatment dates: 12/16/2014: Left temporal 50mm tumor / 20 Gy in 1 fraction      08/01/2014-09/16/2014: Right breast and axillary and supraclavicular region 45 gray in 25 fractions; the adenopathy in the axillary area was boosted to 50.4 gray per Dr. Sondra Come.  Narrative:  The patient returns today for routine follow-up. PET in April showed possible metastatic disease in the chest vs infectious /inflammation.  Possible adenopathy in the abdomen.  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  MRI brain this month was  negative for residual or progressive disease. She denies any headaches, blurred vision, nor changes in fine motor movement  She denies feeling off balance at this time.  Reports level 6 pain all over since starting her recent form of systemic therapy . Continues on [name] under the care of Dr. Burney Gauze.  Now also being followed by Hospice and Palliative Care- Social Worker involved.  She tells me that she has a 30% chance of living to next year, per her discussion with med/onc.   ALLERGIES:  is allergic to iohexol; prednisone; tetanus toxoids; theophyllines; and versed.  Meds: Current Outpatient Prescriptions  Medication Sig Dispense Refill  . albuterol (PROVENTIL HFA;VENTOLIN HFA) 108 (90 BASE) MCG/ACT inhaler Inhale 2 puffs into the lungs every 4 (four) hours as needed for wheezing or shortness of breath. 2 Inhaler 6  . ALPRAZolam (XANAX) 1 MG tablet Take 1 tablet (1 mg total) by mouth 2 (two) times daily as needed for  anxiety. 60 tablet 2  . amoxicillin-clavulanate (AUGMENTIN) 875-125 MG tablet Take 1 tablet by mouth 2 (two) times daily. For 10 days 20 tablet 0  . aspirin (ASPIRIN EC) 81 MG EC tablet Take 81 mg by mouth daily. Swallow whole.    . Bismuth Subsalicylate (PEPTO-BISMOL PO) Take 1 tablet by mouth as needed (upset stomach).     . calcium carbonate (TUMS - DOSED IN MG ELEMENTAL CALCIUM) 500 MG chewable tablet Chew 2 tablets by mouth 2 (two) times daily.    . diphenhydrAMINE (SOMINEX) 25 MG tablet Take 100 mg by mouth at bedtime as needed for sleep. Pt states she is taking up to 200 mg Benadryl at night and still not sleeping    . dronabinol (MARINOL) 10 MG capsule Take 1 capsule (10 mg total) by mouth 3 (three) times daily before meals. 90 capsule 0  . ergocalciferol (VITAMIN D2) 50000 UNITS capsule Take 1 capsule (50,000 Units total) by mouth 2 (two) times a week. 8 capsule 6  . fluconazole (DIFLUCAN) 150 MG tablet Take 1 tablet (150 mg total) by mouth once. 1 tablet 3  . HYDROmorphone (DILAUDID) 4 MG tablet Take 1 tablet (4 mg total) by mouth every 4 (four) hours as needed for severe pain. 90 tablet 0  . Lactulose SOLN Take 2 TBLSP every 6 hours until bowel movement (Patient taking differently: Take 30 mLs by mouth every 6 (six) hours as needed. Take 2 TBLSP every 6 hours until bowel movement) 1000 mL 4  .  lidocaine (LIDODERM) 5 % Place 1 patch onto the skin daily. Remove & Discard patch within 12 hours or as directed by MD 20 patch 0  . lidocaine (XYLOCAINE) 2 % solution Use as directed 20 mLs in the mouth or throat as needed for mouth pain. 200 mL 2  . lidocaine-prilocaine (EMLA) cream Apply 1 application topically as needed. 30 g 6  . loratadine-pseudoephedrine (CLARITIN-D 24-HOUR) 10-240 MG per 24 hr tablet Take 1 tablet by mouth daily.    Marland Kitchen LORazepam (ATIVAN) 1 MG tablet Place 1 tablet under the tongue, IF NEEDED, every 6 hours for nausea. 90 tablet 2  . magic mouthwash w/lidocaine SOLN Take 5 mLs  by mouth 4 (four) times daily as needed for mouth pain. 600 mL 4  . methylphenidate (RITALIN) 10 MG tablet Take 1 tablet (10 mg total) by mouth 2 (two) times daily. 60 tablet 0  . metoCLOPramide (REGLAN) 10 MG tablet Take 1 tablet (10 mg total) by mouth every 6 (six) hours as needed for nausea. 120 tablet 8  . Multiple Vitamin (MULTIVITAMIN) tablet Take 1 tablet by mouth daily.    . NONFORMULARY OR COMPOUNDED ITEM Take 5 mLs by mouth as needed (irritation). DUKES MOUTHWASH  SWISH AND SWALLOW    . Nystatin POWD 1 application by Does not apply route 2 (two) times daily. 1 Bottle 3  . OLANZapine (ZYPREXA) 5 MG tablet Take 1 tablet (5 mg total) by mouth at bedtime. 30 tablet 3  . ondansetron (ZOFRAN) 8 MG tablet Take 1 tablet (8 mg total) by mouth every 8 (eight) hours as needed for nausea or vomiting. 60 tablet 2  . oxybutynin (DITROPAN) 5 MG tablet Take 1 tablet (5 mg total) by mouth 2 (two) times daily. 60 tablet 0  . pantoprazole (PROTONIX) 40 MG tablet Take 1 tablet (40 mg total) by mouth daily. 30 tablet 6  . pilocarpine (SALAGEN) 5 MG tablet Take 1 tablet (5 mg total) by mouth 2 (two) times daily. 60 tablet 3  . prochlorperazine (COMPAZINE) 10 MG tablet Take 1 tablet (10 mg total) by mouth every 6 (six) hours as needed for nausea or vomiting. 60 tablet 2  . promethazine (PHENERGAN) 25 MG suppository Place 1 suppository (25 mg total) rectally every 6 (six) hours as needed for nausea or vomiting. 60 each 3  . Sennosides-Docusate Sodium (SENNA-S PO) Take 1 tablet by mouth as needed.    . sucralfate (CARAFATE) 1 g tablet Take 1 g by mouth daily.  1  . traZODone (DESYREL) 100 MG tablet TAKE 1 TO 2 TABLETS BY MOUTH AS NEEDED FOR SLEEP 60 tablet 2  . fentaNYL (DURAGESIC - DOSED MCG/HR) 100 MCG/HR Place 1 patch (100 mcg total) onto the skin every other day. 15 patch 0  . naloxegol oxalate (MOVANTIK) 25 MG TABS tablet Take 1 tablet (25 mg total) by mouth daily. (Patient not taking: Reported on 03/13/2016)  30 tablet 6   No current facility-administered medications for this encounter.    Physical Findings: The patient is in no acute distress. Patient is alert and oriented.  height is 5\' 7"  (1.702 m) and weight is 172 lb 11.2 oz (78.336 kg). Her temperature is 97.7 F (36.5 C). Her blood pressure is 100/65 and her pulse is 83. Her respiration is 20 and oxygen saturation is 94%. .    Nasal cannula in place with 2L of oxygen. HEENT : EOMI. Oropharnx is clear  NEURO: Rapidly alternating movements and finger to nose testing intact.  MSK: Muscular strength is intact and symmetric except for b/l hip flexor weakness, esp on right, attributed to hip pain.Marland Kitchen PSYCH: judgment / insight intact Lungs: decreased right chest sounds; left lower lung atelectasis. Heart RRR  Lab Findings: Lab Results  Component Value Date   WBC 4.8 03/09/2016   HGB 11.8 03/09/2016   HCT 35.0 03/09/2016   MCV 91 03/09/2016   PLT 91* 03/09/2016    Radiographic Findings: Mr Bianca Kent [id number] Contrast  03/08/2016  CLINICAL DATA:  50 year old female Brain metastasis, Grade III triple positive breast cancer. Status post SRS to Left temporal 4 mm tumor / 20 Gy in 1 fraction. Subsequent encounter. EXAM: MRI HEAD WITHOUT AND WITH CONTRAST TECHNIQUE: Multiplanar, multiecho pulse sequences of the brain and surrounding structures were obtained without and with intravenous contrast. CONTRAST:  25mL MULTIHANCE GADOBENATE DIMEGLUMINE 529 MG/ML IV SOLN COMPARISON:  12/09/2015 and earlier. FINDINGS: The residual punctate area of enhancement in the posterior left temporal lobe has essentially resolved (series 10, image 62 today). No abnormal intracranial enhancement identified today. No dural thickening. Bone marrow signal is stable and within normal limits. Negative visualized cervical spine and spinal cord. Stable cerebral volume. No restricted diffusion to suggest acute infarction. No midline shift, mass effect, evidence of mass lesion,  ventriculomegaly, extra-axial collection or acute intracranial hemorrhage. Cervicomedullary junction and pituitary are within normal limits. Major intracranial vascular flow voids are stable and normal. Stable and normal noncontrast gray and white matter signal throughout the brain. Visible internal auditory structures appear normal. Mastoids remain clear. 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 Negative orbit and scalp soft tissues. IMPRESSION: 1. Satisfactory post treatment appearance of the brain. The punctate residual posterior left temporal lobe lesion has essentially resolved. 2. No new intracranial abnormality. Electronically Signed   By: Genevie Ann M.D.   On: 03/08/2016 15:13    Impression/Plan: Doing well from a CNS standpoint.  F/u in 6 months with Dr Sherwood Gambler after repeat brain MRI, sooner if needed. If she is doing much worse clinically, or on hospice fully, this can be cancelled. She will follow with Dr [name] re: systemic issues which appear to be her biggest challenge right now.   Supportive words given today.  _____________________________________   Eppie Gibson, MD

## 2016-03-14 NOTE — Addendum Note (Signed)
Encounter addended by: Ernst Spell, RN on: 03/14/2016  2:59 PM<BR>     Documentation filed: Charges VN

## 2016-03-14 NOTE — Addendum Note (Signed)
Encounter addended by: Ernst Spell, RN on: 03/14/2016 10:59 AM<BR>     Documentation filed: Charges VN

## 2016-03-17 ENCOUNTER — Ambulatory Visit: Payer: 59

## 2016-03-21 DIAGNOSIS — J9601 Acute respiratory failure with hypoxia: Secondary | ICD-10-CM | POA: Diagnosis not present

## 2016-03-23 ENCOUNTER — Other Ambulatory Visit: Payer: 59

## 2016-03-23 ENCOUNTER — Ambulatory Visit: Payer: 59 | Admitting: Hematology & Oncology

## 2016-03-23 ENCOUNTER — Ambulatory Visit: Payer: 59

## 2016-03-23 MED FILL — OLANZapine 5 MG TABS: 5 | 30 days supply | Qty: 30 | Fill #1

## 2016-03-23 MED FILL — traZODone HCL 100 MG TABS: 100 | 30 days supply | Qty: 60 | Fill #1

## 2016-03-29 ENCOUNTER — Other Ambulatory Visit: Payer: Self-pay | Admitting: *Deleted

## 2016-03-29 ENCOUNTER — Ambulatory Visit (HOSPITAL_BASED_OUTPATIENT_CLINIC_OR_DEPARTMENT_OTHER): Payer: 59

## 2016-03-29 ENCOUNTER — Other Ambulatory Visit (HOSPITAL_BASED_OUTPATIENT_CLINIC_OR_DEPARTMENT_OTHER): Payer: 59

## 2016-03-29 ENCOUNTER — Ambulatory Visit (HOSPITAL_BASED_OUTPATIENT_CLINIC_OR_DEPARTMENT_OTHER): Payer: 59 | Admitting: Hematology & Oncology

## 2016-03-29 ENCOUNTER — Encounter: Payer: Self-pay | Admitting: Hematology & Oncology

## 2016-03-29 VITALS — BP 115/72 | HR 77 | Temp 98.2°F | Resp 18 | Ht 67.0 in | Wt 168.0 lb

## 2016-03-29 DIAGNOSIS — C7951 Secondary malignant neoplasm of bone: Secondary | ICD-10-CM

## 2016-03-29 DIAGNOSIS — Z79899 Other long term (current) drug therapy: Secondary | ICD-10-CM | POA: Diagnosis not present

## 2016-03-29 DIAGNOSIS — C50911 Malignant neoplasm of unspecified site of right female breast: Secondary | ICD-10-CM

## 2016-03-29 DIAGNOSIS — C50912 Malignant neoplasm of unspecified site of left female breast: Secondary | ICD-10-CM | POA: Diagnosis not present

## 2016-03-29 DIAGNOSIS — Z5112 Encounter for antineoplastic immunotherapy: Secondary | ICD-10-CM | POA: Diagnosis not present

## 2016-03-29 DIAGNOSIS — C7931 Secondary malignant neoplasm of brain: Secondary | ICD-10-CM

## 2016-03-29 DIAGNOSIS — N342 Other urethritis: Secondary | ICD-10-CM

## 2016-03-29 DIAGNOSIS — C50919 Malignant neoplasm of unspecified site of unspecified female breast: Secondary | ICD-10-CM

## 2016-03-29 DIAGNOSIS — D696 Thrombocytopenia, unspecified: Secondary | ICD-10-CM

## 2016-03-29 LAB — CBC WITH DIFFERENTIAL (CANCER CENTER ONLY)
BASO#: 0 10*3/uL (ref 0.0–0.2)
BASO%: 0.4 % (ref 0.0–2.0)
EOS ABS: 0.1 10*3/uL (ref 0.0–0.5)
EOS%: 2.2 % (ref 0.0–7.0)
HEMATOCRIT: 38 % (ref 34.8–46.6)
HEMOGLOBIN: 12.8 g/dL (ref 11.6–15.9)
LYMPH#: 0.7 10*3/uL — ABNORMAL LOW (ref 0.9–3.3)
LYMPH%: 14.5 % (ref 14.0–48.0)
MCH: 29.9 pg (ref 26.0–34.0)
MCHC: 33.7 g/dL (ref 32.0–36.0)
MCV: 89 fL (ref 81–101)
MONO#: 0.4 10*3/uL (ref 0.1–0.9)
MONO%: 7.8 % (ref 0.0–13.0)
NEUT#: 3.4 10*3/uL (ref 1.5–6.5)
NEUT%: 75.1 % (ref 39.6–80.0)
Platelets: 74 10*3/uL — ABNORMAL LOW (ref 145–400)
RBC: 4.28 10*6/uL (ref 3.70–5.32)
RDW: 13.2 % (ref 11.1–15.7)
WBC: 4.5 10*3/uL (ref 3.9–10.0)

## 2016-03-29 LAB — CMP (CANCER CENTER ONLY)
ALK PHOS: 126 U/L — AB (ref 26–84)
ALT(SGPT): 25 U/L (ref 10–47)
AST: 31 U/L (ref 11–38)
Albumin: 3.5 g/dL (ref 3.3–5.5)
BILIRUBIN TOTAL: 0.6 mg/dL (ref 0.20–1.60)
BUN, Bld: 8 mg/dL (ref 7–22)
CALCIUM: 8.9 mg/dL (ref 8.0–10.3)
CO2: 27 meq/L (ref 18–33)
Chloride: 105 mEq/L (ref 98–108)
Creat: 0.8 mg/dl (ref 0.6–1.2)
GLUCOSE: 104 mg/dL (ref 73–118)
POTASSIUM: 3.7 meq/L (ref 3.3–4.7)
Sodium: 137 mEq/L (ref 128–145)
Total Protein: 7 g/dL (ref 6.4–8.1)

## 2016-03-29 LAB — URINALYSIS, MICROSCOPIC (CHCC SATELLITE)
BILIRUBIN (URINE): NEGATIVE
BLOOD: NEGATIVE
Glucose: NEGATIVE mg/dL
Ketones: NEGATIVE mg/dL
NITRITE: NEGATIVE
PH: 8.5 (ref 4.60–8.00)
Protein: <30 mg/dL
Specific Gravity, Urine: 1.005 (ref 1.003–1.035)
UROBILINOGEN UR: 0.2 mg/dL (ref 0.2–1)

## 2016-03-29 MED ORDER — ACETAMINOPHEN 325 MG PO TABS
ORAL_TABLET | ORAL | Status: AC
  Filled 2016-03-29: qty 2

## 2016-03-29 MED ORDER — SODIUM CHLORIDE 0.9 % IV SOLN
Freq: Once | INTRAVENOUS | Status: AC
  Administered 2016-03-29: 12:00:00 via INTRAVENOUS

## 2016-03-29 MED ORDER — HEPARIN SOD (PORK) LOCK FLUSH 100 UNIT/ML IV SOLN
500.0000 [IU] | Freq: Once | INTRAVENOUS | Status: AC | PRN
  Administered 2016-03-29: 500 [IU]
  Filled 2016-03-29: qty 5

## 2016-03-29 MED ORDER — SULFAMETHOXAZOLE-TRIMETHOPRIM 800-160 MG PO TABS: 1.0000 | ORAL_TABLET | Freq: Two times a day (BID) | ORAL | Status: DC

## 2016-03-29 MED ORDER — TRASTUZUMAB CHEMO INJECTION 440 MG
6.0000 mg/kg | Freq: Once | INTRAVENOUS | Status: AC
  Administered 2016-03-29: 483 mg via INTRAVENOUS
  Filled 2016-03-29: qty 23

## 2016-03-29 MED ORDER — DIPHENHYDRAMINE HCL 25 MG PO CAPS
50.0000 mg | ORAL_CAPSULE | Freq: Once | ORAL | Status: AC
  Administered 2016-03-29: 50 mg via ORAL

## 2016-03-29 MED ORDER — SODIUM CHLORIDE 0.9 % IV SOLN
420.0000 mg | Freq: Once | INTRAVENOUS | Status: AC
  Administered 2016-03-29: 420 mg via INTRAVENOUS
  Filled 2016-03-29: qty 14

## 2016-03-29 MED ORDER — SODIUM CHLORIDE 0.9 % IJ SOLN
10.0000 mL | INTRAMUSCULAR | Status: DC | PRN
  Administered 2016-03-29: 10 mL
  Filled 2016-03-29: qty 10

## 2016-03-29 MED ORDER — FULVESTRANT 250 MG/5ML IM SOLN: 500.0000 mg | Freq: Once | INTRAMUSCULAR | Status: DC

## 2016-03-29 MED ORDER — ACETAMINOPHEN 325 MG PO TABS
650.0000 mg | ORAL_TABLET | Freq: Once | ORAL | Status: AC
  Administered 2016-03-29: 650 mg via ORAL

## 2016-03-29 MED ORDER — ZOLEDRONIC ACID 4 MG/100ML IV SOLN
4.0000 mg | Freq: Once | INTRAVENOUS | Status: AC
  Administered 2016-03-29: 4 mg via INTRAVENOUS
  Filled 2016-03-29: qty 100

## 2016-03-29 MED ORDER — DIPHENHYDRAMINE HCL 25 MG PO CAPS
ORAL_CAPSULE | ORAL | Status: AC
  Filled 2016-03-29: qty 2

## 2016-03-29 MED ORDER — FULVESTRANT 250 MG/5ML IM SOLN
INTRAMUSCULAR | Status: AC
  Filled 2016-03-29: qty 10

## 2016-03-29 MED FILL — SULFAMETHOXAZOLE-TMP DS TAB: 800-160 | 7 days supply | Qty: 14 | Fill #0

## 2016-03-29 NOTE — Patient Instructions (Signed)
[facility] Discharge Instructions for Patients Receiving Chemotherapy  Today you received the following chemotherapy agents Herceptin, Perjeta and Zometa.  To help prevent nausea and vomiting after your treatment, we encourage you to take your nausea medication.   If you develop nausea and vomiting that is not controlled by your nausea medication, call the clinic.   BELOW ARE SYMPTOMS THAT SHOULD BE REPORTED IMMEDIATELY:  *FEVER GREATER THAN 100.5 F  *CHILLS WITH OR WITHOUT FEVER  NAUSEA AND VOMITING THAT IS NOT CONTROLLED WITH YOUR NAUSEA MEDICATION  *UNUSUAL SHORTNESS OF BREATH  *UNUSUAL BRUISING OR BLEEDING  TENDERNESS IN MOUTH AND THROAT WITH OR WITHOUT PRESENCE OF ULCERS  *URINARY PROBLEMS  *BOWEL PROBLEMS  UNUSUAL RASH Items with * indicate a potential emergency and should be followed up as soon as possible.  Feel free to call the clinic you have any questions or concerns. The clinic phone number is [phone].  Please show the [facility] at check-in to the Emergency Department and triage nurse.

## 2016-03-29 NOTE — Progress Notes (Signed)
Hematology and Oncology Follow Up Visit  Bianca Kent [id number] 1966-09-29 50 y.o. 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 is back for followup . She now is on Faslodex. She's doing well with this. She did have some hip pain after the first couple shots. I don't think this is that unexpected. I told her to try some Bianca Kent. This may help.  She is having some issues with her urine. We did have a urinary tract infection recently. She's given antibiotics. This seemed to get better and now appears to be a little bit worse. As such, we will see about her also Bactrim.  The left she was doing much better. We gave her some antibiotic for this last time that we saw her.  Overall, pain-wise she seems to be doing pretty well. Her pain I think is pretty well controlled.  Her weight is going down little bit. She feels that she is eating okay. Her sister sometimes thinks she is not eating as much.  She's not had any nausea or vomiting. She does have occasional mouth soreness but no ulcerations.   Her breathing is doing about the same. She needs supplemental oxygen. 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 tite might be a little better. Her tongue has been sore. Her throat has been sore.  She recently had a urinary tract infection. She was treated with Bactrim. This helped quite a bit.  Her last CA 27.29 was holding steady at 20.  She's had no bleeding. She's had no change in bowel or bladder habits. She is now on some laxatives.  Palliative care is helping out quite a bit with her pain. She is on a fentanyl patch at 100 g. This seems to be helping her quite a bit.  Overall, her performance status is Bianca Kent  2.    Medications:  Current outpatient prescriptions:  .  albuterol (PROVENTIL HFA;VENTOLIN HFA) 108 (90 BASE) MCG/ACT inhaler, Inhale 2 puffs into the lungs every 4 (four) hours as needed for wheezing or shortness of breath., Disp: 2 Inhaler, Rfl: 6 .  ALPRAZolam (XANAX) 1 MG tablet, Take 1 tablet (1 mg total) by mouth 2 (two) times daily as needed for anxiety., Disp: 60 tablet, Rfl: 2 .  amoxicillin-clavulanate (AUGMENTIN) 875-125 MG tablet, Take 1 tablet by mouth 2 (two) times daily. For 10 days, Disp: 20 tablet, Rfl: 0 .  aspirin (ASPIRIN EC) 81 MG EC tablet, Take 81 mg by mouth daily. Swallow whole., Disp: , Rfl:  .  Bismuth Subsalicylate (PEPTO-BISMOL PO), Take 1 tablet by mouth as needed (upset stomach). , Disp: , Rfl:  .  calcium carbonate (TUMS - DOSED IN MG ELEMENTAL CALCIUM) 500 MG chewable tablet, Chew 2 tablets by mouth 2 (two) times daily., Disp: , Rfl:  .  diphenhydrAMINE (SOMINEX) 25 MG tablet, Take 100 mg by mouth at bedtime as needed for sleep. Pt states she is taking up to 200 mg Benadryl at night and still not sleeping, Disp: , Rfl:  .  dronabinol (MARINOL) 10 MG capsule, Take 1 capsule (10 mg total) by mouth 3 (three) times daily before meals., Disp: 90 capsule, Rfl: 0 .  ergocalciferol (VITAMIN D2) 50000 UNITS capsule, Take  1 capsule (50,000 Units total) by mouth 2 (two) times a week., Disp: 8 capsule, Rfl: 6 .  fentaNYL (DURAGESIC - DOSED MCG/HR) 100 MCG/HR, Place 1 patch (100 mcg total) onto the skin every other day., Disp: 15 patch, Rfl: 0 .  fluconazole (DIFLUCAN) 150 MG tablet, Take 1 tablet (150 mg total) by mouth once., Disp: 1 tablet, Rfl: 3 .  HYDROmorphone (DILAUDID) 4 MG tablet, Take 1 tablet (4 mg total) by mouth every 4 (four) hours as needed for severe pain., Disp: 90 tablet, Rfl: 0 .  Lactulose SOLN, Take 2 TBLSP every 6 hours until bowel movement (Patient taking differently: Take 30 mLs by mouth every 6 (six) hours as needed. Take 2 TBLSP every 6 hours until  bowel movement), Disp: 1000 mL, Rfl: 4 .  lidocaine (LIDODERM) 5 %, Place 1 patch onto the skin daily. Remove & Discard patch within 12 hours or as directed by MD, Disp: 20 patch, Rfl: 0 .  lidocaine (XYLOCAINE) 2 % solution, Use as directed 20 mLs in the mouth or throat as needed for mouth pain., Disp: 200 mL, Rfl: 2 .  lidocaine-prilocaine (EMLA) cream, Apply 1 application topically as needed., Disp: 30 g, Rfl: 6 .  loratadine-pseudoephedrine (Bianca Kent-D 24-HOUR) 10-240 MG per 24 hr tablet, Take 1 tablet by mouth daily., Disp: , Rfl:  .  LORazepam (ATIVAN) 1 MG tablet, Place 1 tablet under the tongue, IF NEEDED, every 6 hours for nausea., Disp: 90 tablet, Rfl: 2 .  magic mouthwash w/lidocaine SOLN, Take 5 mLs by mouth 4 (four) times daily as needed for mouth pain., Disp: 600 mL, Rfl: 4 .  methylphenidate (RITALIN) 10 MG tablet, Take 1 tablet (10 mg total) by mouth 2 (two) times daily., Disp: 60 tablet, Rfl: 0 .  metoCLOPramide (REGLAN) 10 MG tablet, Take 1 tablet (10 mg total) by mouth every 6 (six) hours as needed for nausea., Disp: 120 tablet, Rfl: 8 .  Multiple Vitamin (MULTIVITAMIN) tablet, Take 1 tablet by mouth daily., Disp: , Rfl:  .  naloxegol oxalate (MOVANTIK) 25 MG TABS tablet, Take 1 tablet (25 mg total) by mouth daily., Disp: 30 tablet, Rfl: 6 .  NONFORMULARY OR COMPOUNDED ITEM, Take 5 mLs by mouth as needed (irritation). DUKES MOUTHWASH  SWISH AND SWALLOW, Disp: , Rfl:  .  Nystatin POWD, 1 application by Does not apply route 2 (two) times daily., Disp: 1 Bottle, Rfl: 3 .  OLANZapine (ZYPREXA) 5 MG tablet, Take 1 tablet (5 mg total) by mouth at bedtime., Disp: 30 tablet, Rfl: 3 .  ondansetron (ZOFRAN) 8 MG tablet, Take 1 tablet (8 mg total) by mouth every 8 (eight) hours as needed for nausea or vomiting., Disp: 60 tablet, Rfl: 2 .  oxybutynin (DITROPAN) 5 MG tablet, Take 1 tablet (5 mg total) by mouth 2 (two) times daily., Disp: 60 tablet, Rfl: 0 .  pantoprazole (PROTONIX) 40 MG  tablet, Take 1 tablet (40 mg total) by mouth daily., Disp: 30 tablet, Rfl: 6 .  pilocarpine (SALAGEN) 5 MG tablet, Take 1 tablet (5 mg total) by mouth 2 (two) times daily., Disp: 60 tablet, Rfl: 3 .  prochlorperazine (COMPAZINE) 10 MG tablet, Take 1 tablet (10 mg total) by mouth every 6 (six) hours as needed for nausea or vomiting., Disp: 60 tablet, Rfl: 2 .  promethazine (PHENERGAN) 25 MG suppository, Place 1 suppository (25 mg total) rectally every 6 (six) hours as needed for nausea or vomiting., Disp: 60 each, Rfl: 3 .  Sennosides-Docusate Sodium (SENNA-S  PO), Take 1 tablet by mouth as needed., Disp: , Rfl:  .  sucralfate (CARAFATE) 1 g tablet, Take 1 g by mouth daily., Disp: , Rfl: 1 .  traZODone (DESYREL) 100 MG tablet, TAKE 1 TO 2 TABLETS BY MOUTH AS NEEDED FOR SLEEP, Disp: 60 tablet, Rfl: 2 .  sulfamethoxazole-trimethoprim (BACTRIM DS,SEPTRA DS) 800-160 MG tablet, Take 1 tablet by mouth 2 (two) times daily., Disp: 14 tablet, Rfl: 0 No current facility-administered medications for this visit.  Facility-Administered Medications Ordered in Other Visits:  .  sodium chloride 0.9 % injection 10 mL, 10 mL, Intracatheter, PRN, [name], MD, 10 mL at 03/29/16 1356  Allergies:  Allergies  Allergen Reactions  . Iohexol      Code: RASH, Desc: VERY STRONG FAMILY HX OF ANGIOEDEMA WHEN RECEIVING IV CONTRAST; PT HAS BEEN PREMEDICATED FOR OTHER CONTRASTED STUDIES(IN CATH. LAB)  KR, Onset Date: [id number]   . Prednisone Itching    Capillary beds bust  . Tetanus Toxoids Other (See Comments)    Ran a high fever for 48 hours  . Theophyllines Hives    Mental changes  . Versed [Midazolam] Other (See Comments)    Pt becomes violent    Past Medical History, Surgical history, Social history, and Family History were reviewed and updated.  Review of Systems: As above  Physical Exam:  height is 5\' 7"  (1.702 m) and weight is 168 lb (76.204 kg). Her oral temperature is 98.2 F (36.8 C). Her blood  pressure is 115/72 and her pulse is 77. Her respiration is 18.   Well-developed and well-nourished white female. Head and exam shows no ocular or oral lesions. She's able to move both eyes well. She has good extraocular muscle . There is no erythema or exudate from the left eye. There might be some slight fullness in the right axilla by do not detect any obvious adenopathy. I cannot detect any obvious right supraclavicular lymph nodes. She has improved range of motion of the right shoulder. Left supraclavicular region is okay. No masses are noted. Lungs are clear. Cardiac exam regular rate and rhythm with no murmurs, rubs or bruits.. Abdomen is soft. She has good bowel sounds. There is no palpable liver or spleen tip. Back exam shows decreased tenderness over the lumbar and thoracic spine to palpation. Extremities shows no clubbing, cyanosis or edema. She has 4/5 strength in her legs. Skin exam shows improvement in the radiation dermatitis of the right breast and axilla. There is no open wound. There is some hyper pigmentation. Neurological exam is nonfocal.    Lab Results  Component Value Date   WBC 4.5 03/29/2016   HGB 12.8 03/29/2016   HCT 38.0 03/29/2016   MCV 89 03/29/2016   PLT 74* 03/29/2016     Chemistry      Component Value Date/Time   NA 137 03/29/2016 0904   NA 143 11/08/2015 1047   NA 138 04/30/2015 1019   K 3.7 03/29/2016 0904   K 4.0 11/08/2015 1047   K 3.5 04/30/2015 1019   CL 105 03/29/2016 0904   CL 110 11/08/2015 1047   CO2 27 03/29/2016 0904   CO2 24 11/08/2015 1047   CO2 29 04/30/2015 1019   BUN 8 03/29/2016 0904   BUN 6 11/08/2015 1047   BUN 9.7 04/30/2015 1019   CREATININE 0.8 03/29/2016 0904   CREATININE 0.78 11/08/2015 1047   CREATININE 0.8 04/30/2015 1019      Component Value Date/Time   CALCIUM 8.9 03/29/2016  [id number]   CALCIUM 9.4 11/08/2015 1047   CALCIUM 9.7 04/30/2015 1019   ALKPHOS 126* 03/29/2016 0904   ALKPHOS 257* 04/30/2015 1019   ALKPHOS 238*  03/06/2015 0425   AST 31 03/29/2016 0904   AST 57* 04/30/2015 1019   AST 41 03/06/2015 0425   ALT 25 03/29/2016 0904   ALT 41 04/30/2015 1019   ALT 38 03/06/2015 0425   BILITOT 0.60 03/29/2016 0904   BILITOT 0.60 04/30/2015 1019   BILITOT 0.4 03/06/2015 0425         Impression and Plan: Ms. Hauptman is 50 year old white female with triple positive metastatic breast cancer.  I really feel that the Faslodex is helping her. Her alkaline phosphatase she's coming down. I think this is a good sign.   I'm glad that the MRI the brain looked okay without any evidence of metastatic disease.   We will continue the Herceptin and Perjeta.   Our main goals her quality of life. We talked about her driving. I told that she can drive locally but no more than 30 miles at a time. I just don't think she is ready to do this. She understands this.   I want to see her back in another month now  and hopefully, she will continue to improve with her performance status and be more functional.   As always, we had a really good prayer session. Her faith remains incredibly strong.   I spent about 30 minutes with she and her sister.   She will continue the Zometa.   [name], MD 6/7/20175:57 PM

## 2016-03-30 ENCOUNTER — Ambulatory Visit: Payer: 59 | Admitting: Hematology & Oncology

## 2016-03-30 ENCOUNTER — Ambulatory Visit: Payer: 59

## 2016-03-30 ENCOUNTER — Other Ambulatory Visit: Payer: 59

## 2016-03-31 ENCOUNTER — Ambulatory Visit: Payer: 59 | Admitting: Hematology & Oncology

## 2016-03-31 ENCOUNTER — Other Ambulatory Visit: Payer: 59

## 2016-03-31 ENCOUNTER — Ambulatory Visit: Payer: 59

## 2016-03-31 LAB — URINE CULTURE

## 2016-04-03 ENCOUNTER — Telehealth: Payer: Self-pay | Admitting: *Deleted

## 2016-04-03 NOTE — Telephone Encounter (Addendum)
Robin aware of culture results  ----- Message from [name], MD sent at 03/31/2016  4:09 PM EDT ----- Call her sister, [name] -- there is a UTI but the bactrim i gave her should work!!!  Film/video editor

## 2016-04-04 ENCOUNTER — Ambulatory Visit: Payer: 59 | Admitting: Family

## 2016-04-04 ENCOUNTER — Ambulatory Visit (HOSPITAL_BASED_OUTPATIENT_CLINIC_OR_DEPARTMENT_OTHER): Payer: 59

## 2016-04-04 ENCOUNTER — Other Ambulatory Visit: Payer: Self-pay | Admitting: *Deleted

## 2016-04-04 VITALS — BP 111/70 | HR 64 | Temp 97.9°F | Resp 18

## 2016-04-04 DIAGNOSIS — Z5111 Encounter for antineoplastic chemotherapy: Secondary | ICD-10-CM

## 2016-04-04 DIAGNOSIS — C50912 Malignant neoplasm of unspecified site of left female breast: Secondary | ICD-10-CM

## 2016-04-04 DIAGNOSIS — R5383 Other fatigue: Secondary | ICD-10-CM

## 2016-04-04 DIAGNOSIS — C50911 Malignant neoplasm of unspecified site of right female breast: Secondary | ICD-10-CM

## 2016-04-04 DIAGNOSIS — C7931 Secondary malignant neoplasm of brain: Secondary | ICD-10-CM

## 2016-04-04 DIAGNOSIS — R112 Nausea with vomiting, unspecified: Secondary | ICD-10-CM

## 2016-04-04 DIAGNOSIS — R64 Cachexia: Secondary | ICD-10-CM

## 2016-04-04 DIAGNOSIS — G4701 Insomnia due to medical condition: Secondary | ICD-10-CM

## 2016-04-04 MED ORDER — FULVESTRANT 250 MG/5ML IM SOLN
500.0000 mg | Freq: Once | INTRAMUSCULAR | Status: AC
  Administered 2016-04-04: 500 mg via INTRAMUSCULAR

## 2016-04-04 MED ORDER — HYDROMORPHONE HCL 4 MG PO TABS: 4.0000 mg | ORAL_TABLET | ORAL | Status: DC | PRN

## 2016-04-04 MED FILL — HYDROmorphone HCL 4 MG TABS: 4 | 15 days supply | Qty: 90 | Fill #0

## 2016-04-04 NOTE — Patient Instructions (Signed)

## 2016-04-05 ENCOUNTER — Ambulatory Visit: Payer: 59

## 2016-04-13 ENCOUNTER — Ambulatory Visit: Payer: 59

## 2016-04-13 ENCOUNTER — Ambulatory Visit: Payer: 59 | Admitting: Hematology & Oncology

## 2016-04-13 ENCOUNTER — Other Ambulatory Visit: Payer: 59

## 2016-04-20 ENCOUNTER — Other Ambulatory Visit (HOSPITAL_BASED_OUTPATIENT_CLINIC_OR_DEPARTMENT_OTHER): Payer: 59

## 2016-04-20 ENCOUNTER — Encounter: Payer: Self-pay | Admitting: Hematology & Oncology

## 2016-04-20 ENCOUNTER — Other Ambulatory Visit: Payer: Self-pay

## 2016-04-20 ENCOUNTER — Ambulatory Visit (HOSPITAL_BASED_OUTPATIENT_CLINIC_OR_DEPARTMENT_OTHER): Payer: 59

## 2016-04-20 ENCOUNTER — Ambulatory Visit (HOSPITAL_BASED_OUTPATIENT_CLINIC_OR_DEPARTMENT_OTHER): Payer: 59 | Admitting: Hematology & Oncology

## 2016-04-20 VITALS — BP 127/67 | HR 58 | Temp 98.0°F | Resp 20 | Ht 67.0 in | Wt 164.0 lb

## 2016-04-20 DIAGNOSIS — C50911 Malignant neoplasm of unspecified site of right female breast: Secondary | ICD-10-CM

## 2016-04-20 DIAGNOSIS — C50912 Malignant neoplasm of unspecified site of left female breast: Secondary | ICD-10-CM

## 2016-04-20 DIAGNOSIS — Z170 Estrogen receptor positive status [ER+]: Secondary | ICD-10-CM

## 2016-04-20 DIAGNOSIS — D696 Thrombocytopenia, unspecified: Secondary | ICD-10-CM

## 2016-04-20 DIAGNOSIS — C50919 Malignant neoplasm of unspecified site of unspecified female breast: Secondary | ICD-10-CM

## 2016-04-20 DIAGNOSIS — K909 Intestinal malabsorption, unspecified: Secondary | ICD-10-CM

## 2016-04-20 DIAGNOSIS — Z23 Encounter for immunization: Secondary | ICD-10-CM

## 2016-04-20 DIAGNOSIS — R5383 Other fatigue: Secondary | ICD-10-CM

## 2016-04-20 DIAGNOSIS — N342 Other urethritis: Secondary | ICD-10-CM

## 2016-04-20 DIAGNOSIS — R748 Abnormal levels of other serum enzymes: Secondary | ICD-10-CM

## 2016-04-20 DIAGNOSIS — Z5112 Encounter for antineoplastic immunotherapy: Secondary | ICD-10-CM

## 2016-04-20 DIAGNOSIS — R112 Nausea with vomiting, unspecified: Secondary | ICD-10-CM

## 2016-04-20 DIAGNOSIS — C7931 Secondary malignant neoplasm of brain: Secondary | ICD-10-CM | POA: Diagnosis not present

## 2016-04-20 DIAGNOSIS — R64 Cachexia: Secondary | ICD-10-CM

## 2016-04-20 DIAGNOSIS — G4701 Insomnia due to medical condition: Secondary | ICD-10-CM

## 2016-04-20 LAB — CMP (CANCER CENTER ONLY)
ALBUMIN: 3.7 g/dL (ref 3.3–5.5)
ALK PHOS: 204 U/L — AB (ref 26–84)
ALT: 24 U/L (ref 10–47)
AST: 38 U/L (ref 11–38)
BILIRUBIN TOTAL: 0.6 mg/dL (ref 0.20–1.60)
BUN, Bld: 10 mg/dL (ref 7–22)
CALCIUM: 8.8 mg/dL (ref 8.0–10.3)
CO2: 30 meq/L (ref 18–33)
CREATININE: 0.9 mg/dL (ref 0.6–1.2)
Chloride: 102 mEq/L (ref 98–108)
Glucose, Bld: 95 mg/dL (ref 73–118)
Potassium: 3.6 mEq/L (ref 3.3–4.7)
SODIUM: 136 meq/L (ref 128–145)
Total Protein: 7.5 g/dL (ref 6.4–8.1)

## 2016-04-20 LAB — CBC WITH DIFFERENTIAL (CANCER CENTER ONLY)
BASO#: 0 10*3/uL (ref 0.0–0.2)
BASO%: 0.2 % (ref 0.0–2.0)
EOS%: 1.2 % (ref 0.0–7.0)
Eosinophils Absolute: 0.1 10*3/uL (ref 0.0–0.5)
HEMATOCRIT: 36.1 % (ref 34.8–46.6)
HEMOGLOBIN: 12.1 g/dL (ref 11.6–15.9)
LYMPH#: 0.6 10*3/uL — AB (ref 0.9–3.3)
LYMPH%: 15.5 % (ref 14.0–48.0)
MCH: 29.3 pg (ref 26.0–34.0)
MCHC: 33.5 g/dL (ref 32.0–36.0)
MCV: 87 fL (ref 81–101)
MONO#: 0.3 10*3/uL (ref 0.1–0.9)
MONO%: 6.1 % (ref 0.0–13.0)
NEUT%: 77 % (ref 39.6–80.0)
NEUTROS ABS: 3.2 10*3/uL (ref 1.5–6.5)
Platelets: 108 10*3/uL — ABNORMAL LOW (ref 145–400)
RBC: 4.13 10*6/uL (ref 3.70–5.32)
RDW: 13.7 % (ref 11.1–15.7)
WBC: 4.1 10*3/uL (ref 3.9–10.0)

## 2016-04-20 MED ORDER — SODIUM CHLORIDE 0.9 % IJ SOLN
10.0000 mL | INTRAMUSCULAR | Status: DC | PRN
  Administered 2016-04-20: 10 mL
  Filled 2016-04-20: qty 10

## 2016-04-20 MED ORDER — TRASTUZUMAB CHEMO INJECTION 440 MG
6.0000 mg/kg | Freq: Once | INTRAVENOUS | Status: AC
  Administered 2016-04-20: 483 mg via INTRAVENOUS
  Filled 2016-04-20: qty 23

## 2016-04-20 MED ORDER — ZOLEDRONIC ACID 4 MG/100ML IV SOLN
4.0000 mg | Freq: Once | INTRAVENOUS | Status: AC
  Administered 2016-04-20: 4 mg via INTRAVENOUS
  Filled 2016-04-20: qty 100

## 2016-04-20 MED ORDER — SODIUM CHLORIDE 0.9 % IV SOLN
Freq: Once | INTRAVENOUS | Status: AC
  Administered 2016-04-20: 12:00:00 via INTRAVENOUS

## 2016-04-20 MED ORDER — OLANZAPINE 5 MG PO TABS: 5.0000 mg | ORAL_TABLET | Freq: Every day | ORAL | Status: DC

## 2016-04-20 MED ORDER — ACETAMINOPHEN 325 MG PO TABS
650.0000 mg | ORAL_TABLET | Freq: Once | ORAL | Status: AC
  Administered 2016-04-20: 650 mg via ORAL

## 2016-04-20 MED ORDER — ONDANSETRON HCL 8 MG PO TABS: 8.0000 mg | ORAL_TABLET | Freq: Three times a day (TID) | ORAL | Status: DC | PRN

## 2016-04-20 MED ORDER — HEPARIN SOD (PORK) LOCK FLUSH 100 UNIT/ML IV SOLN
500.0000 [IU] | Freq: Once | INTRAVENOUS | Status: AC | PRN
  Administered 2016-04-20: 500 [IU]
  Filled 2016-04-20: qty 5

## 2016-04-20 MED ORDER — HEPARIN SOD (PORK) LOCK FLUSH 100 UNIT/ML IV SOLN
250.0000 [IU] | Freq: Once | INTRAVENOUS | Status: DC | PRN
  Filled 2016-04-20: qty 5

## 2016-04-20 MED ORDER — ALTEPLASE 2 MG IJ SOLR
2.0000 mg | Freq: Once | INTRAMUSCULAR | Status: DC | PRN
  Filled 2016-04-20: qty 2

## 2016-04-20 MED ORDER — FENTANYL 100 MCG/HR TD PT72: 100.0000 ug | MEDICATED_PATCH | TRANSDERMAL | Status: DC

## 2016-04-20 MED ORDER — SODIUM CHLORIDE 0.9 % IJ SOLN
3.0000 mL | INTRAMUSCULAR | Status: DC | PRN
  Filled 2016-04-20: qty 10

## 2016-04-20 MED ORDER — HYDROMORPHONE HCL 1 MG/ML IJ SOLN
2.0000 mg | INTRAMUSCULAR | Status: AC | PRN
  Administered 2016-04-20: 2 mg via INTRAVENOUS

## 2016-04-20 MED ORDER — DIPHENHYDRAMINE HCL 25 MG PO CAPS
ORAL_CAPSULE | ORAL | Status: AC
  Filled 2016-04-20: qty 2

## 2016-04-20 MED ORDER — SODIUM CHLORIDE 0.9 % IV SOLN
420.0000 mg | Freq: Once | INTRAVENOUS | Status: AC
  Administered 2016-04-20: 420 mg via INTRAVENOUS
  Filled 2016-04-20: qty 14

## 2016-04-20 MED ORDER — DIPHENHYDRAMINE HCL 25 MG PO CAPS
50.0000 mg | ORAL_CAPSULE | Freq: Once | ORAL | Status: AC
  Administered 2016-04-20: 50 mg via ORAL

## 2016-04-20 MED ORDER — ACETAMINOPHEN 325 MG PO TABS
ORAL_TABLET | ORAL | Status: AC
  Filled 2016-04-20: qty 2

## 2016-04-20 MED ORDER — TRAZODONE HCL 100 MG PO TABS: ORAL_TABLET | ORAL | Status: DC

## 2016-04-20 MED ORDER — HYDROMORPHONE HCL 1 MG/ML IJ SOLN
INTRAMUSCULAR | Status: AC
  Filled 2016-04-20: qty 2

## 2016-04-20 MED FILL — ONDANSETRON HCL 8 MG TABLET: 8 | 20 days supply | Qty: 60 | Fill #0

## 2016-04-20 MED FILL — traZODone HCL 100 MG TABS: 100 | 30 days supply | Qty: 60 | Fill #0

## 2016-04-20 MED FILL — OLANZapine 5 MG TABS: 5 | 30 days supply | Qty: 30 | Fill #0

## 2016-04-20 MED FILL — fentaNYL 100 MCG/HR PT72: 100 | 30 days supply | Qty: 15 | Fill #0

## 2016-04-20 NOTE — Progress Notes (Signed)
Hematology and Oncology Follow Up Visit  Bianca Kent [id number] 02/28/1966 50 y.o. 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  Heninger is back for followup . She now is on Faslodex. She's doing well with this. She did have some hip pain after the first couple shots. I don't think this is that unexpected. I told her to try some Claritin. This may help.  She had a good time done at her father's house. She went fishing. She caught a few fish. This made her happy.   She is quite tired right now. She is resting quite a bit.   Her pain is at a level 3 or 4. This seems to be quite tolerable for her.   She has had some constipation. This is been a chronic issue for her.   Last time she was here, she had a Proteus urinary tract infection. This is treated with Bactrim. She is not having any dysuria right now.   She's still smoking a little bit.    She has had no rashes. She has had no abdominal pain. She has had no further issues with her lungs. She has a paralyzed right hemidiaphragm. She is on supplemental oxygen.    Overall, her performance status is ECOG 2.    Medications:  Current outpatient prescriptions:  .  albuterol (PROVENTIL HFA;VENTOLIN HFA) 108 (90 BASE) MCG/ACT inhaler, Inhale 2 puffs into the lungs every 4 (four) hours as needed for wheezing or shortness of breath., Disp: 2 Inhaler, Rfl: 6 .  ALPRAZolam (XANAX) 1 MG tablet, Take 1 tablet (1 mg total) by mouth 2 (two) times daily as needed for anxiety., Disp: 60 tablet, Rfl: 2 .  aspirin (ASPIRIN EC) 81 MG EC tablet, Take 81 mg by mouth daily. Swallow whole., Disp: , Rfl:  .  Bismuth Subsalicylate (PEPTO-BISMOL PO), Take 1 tablet by mouth as needed (upset stomach). ,  Disp: , Rfl:  .  calcium carbonate (TUMS - DOSED IN MG ELEMENTAL CALCIUM) 500 MG chewable tablet, Chew 2 tablets by mouth 2 (two) times daily., Disp: , Rfl:  .  diphenhydrAMINE (SOMINEX) 25 MG tablet, Take 100 mg by mouth at bedtime as needed for sleep. Pt states she is taking up to 200 mg Benadryl at night and still not sleeping, Disp: , Rfl:  .  dronabinol (MARINOL) 10 MG capsule, Take 1 capsule (10 mg total) by mouth 3 (three) times daily before meals., Disp: 90 capsule, Rfl: 0 .  ergocalciferol (VITAMIN D2) 50000 UNITS capsule, Take 1 capsule (50,000 Units total) by mouth 2 (two) times a week., Disp: 8 capsule, Rfl: 6 .  fentaNYL (DURAGESIC - DOSED MCG/HR) 100 MCG/HR, Place 1 patch (100 mcg total) onto the skin every other day., Disp: 15 patch, Rfl: 0 .  fluconazole (DIFLUCAN) 150 MG tablet, Take 1 tablet (150 mg total) by mouth once., Disp: 1 tablet, Rfl: 3 .  HYDROmorphone (DILAUDID) 4 MG tablet, Take 1 tablet (4 mg total) by mouth every 4 (four) hours as needed for severe pain., Disp: 90 tablet, Rfl: 0 .  Lactulose SOLN, Take 2 TBLSP every 6 hours until bowel movement (Patient taking differently: Take 30 mLs by mouth every 6 (six) hours as needed.  Take 2 TBLSP every 6 hours until bowel movement), Disp: 1000 mL, Rfl: 4 .  lidocaine (LIDODERM) 5 %, Place 1 patch onto the skin daily. Remove & Discard patch within 12 hours or as directed by MD, Disp: 20 patch, Rfl: 0 .  lidocaine (XYLOCAINE) 2 % solution, Use as directed 20 mLs in the mouth or throat as needed for mouth pain., Disp: 200 mL, Rfl: 2 .  lidocaine-prilocaine (EMLA) cream, Apply 1 application topically as needed., Disp: 30 g, Rfl: 6 .  loratadine-pseudoephedrine (CLARITIN-D 24-HOUR) 10-240 MG per 24 hr tablet, Take 1 tablet by mouth daily., Disp: , Rfl:  .  LORazepam (ATIVAN) 1 MG tablet, Place 1 tablet under the tongue, IF NEEDED, every 6 hours for nausea., Disp: 90 tablet, Rfl: 2 .  magic mouthwash w/lidocaine SOLN, Take 5 mLs by mouth  4 (four) times daily as needed for mouth pain., Disp: 600 mL, Rfl: 4 .  methylphenidate (RITALIN) 10 MG tablet, Take 1 tablet (10 mg total) by mouth 2 (two) times daily., Disp: 60 tablet, Rfl: 0 .  metoCLOPramide (REGLAN) 10 MG tablet, Take 1 tablet (10 mg total) by mouth every 6 (six) hours as needed for nausea., Disp: 120 tablet, Rfl: 8 .  Multiple Vitamin (MULTIVITAMIN) tablet, Take 1 tablet by mouth daily., Disp: , Rfl:  .  naloxegol oxalate (MOVANTIK) 25 MG TABS tablet, Take 1 tablet (25 mg total) by mouth daily., Disp: 30 tablet, Rfl: 6 .  NONFORMULARY OR COMPOUNDED ITEM, Take 5 mLs by mouth as needed (irritation). DUKES MOUTHWASH  SWISH AND SWALLOW, Disp: , Rfl:  .  Nystatin POWD, 1 application by Does not apply route 2 (two) times daily., Disp: 1 Bottle, Rfl: 3 .  OLANZapine (ZYPREXA) 5 MG tablet, Take 1 tablet (5 mg total) by mouth at bedtime., Disp: 30 tablet, Rfl: 3 .  ondansetron (ZOFRAN) 8 MG tablet, Take 1 tablet (8 mg total) by mouth every 8 (eight) hours as needed for nausea or vomiting., Disp: 60 tablet, Rfl: 2 .  oxybutynin (DITROPAN) 5 MG tablet, Take 1 tablet (5 mg total) by mouth 2 (two) times daily., Disp: 60 tablet, Rfl: 0 .  pantoprazole (PROTONIX) 40 MG tablet, Take 1 tablet (40 mg total) by mouth daily., Disp: 30 tablet, Rfl: 6 .  pilocarpine (SALAGEN) 5 MG tablet, Take 1 tablet (5 mg total) by mouth 2 (two) times daily., Disp: 60 tablet, Rfl: 3 .  prochlorperazine (COMPAZINE) 10 MG tablet, Take 1 tablet (10 mg total) by mouth every 6 (six) hours as needed for nausea or vomiting., Disp: 60 tablet, Rfl: 2 .  promethazine (PHENERGAN) 25 MG suppository, Place 1 suppository (25 mg total) rectally every 6 (six) hours as needed for nausea or vomiting., Disp: 60 each, Rfl: 3 .  Sennosides-Docusate Sodium (SENNA-S PO), Take 1 tablet by mouth as needed., Disp: , Rfl:  .  sucralfate (CARAFATE) 1 g tablet, Take 1 g by mouth daily., Disp: , Rfl: 1 .  sulfamethoxazole-trimethoprim  (BACTRIM DS,SEPTRA DS) 800-160 MG tablet, Take 1 tablet by mouth 2 (two) times daily., Disp: 14 tablet, Rfl: 0 .  traZODone (DESYREL) 100 MG tablet, TAKE 1 TO 2 TABLETS BY MOUTH AS NEEDED FOR SLEEP, Disp: 60 tablet, Rfl: 2  Allergies:  Allergies  Allergen Reactions  . Iohexol      Code: RASH, Desc: VERY STRONG FAMILY HX OF ANGIOEDEMA WHEN RECEIVING IV CONTRAST; PT HAS BEEN PREMEDICATED FOR OTHER CONTRASTED STUDIES(IN CATH. LAB)  KR, Onset Date: [id number]   .  Prednisone Itching    Capillary beds bust  . Tetanus Toxoids Other (See Comments)    Ran a high fever for 48 hours  . Theophyllines Hives    Mental changes  . Versed [Midazolam] Other (See Comments)    Pt becomes violent    Past Medical History, Surgical history, Social history, and Family History were reviewed and updated.  Review of Systems: As above  Physical Exam:  height is 5\' 7"  (1.702 m) and weight is 164 lb (74.39 kg). Her oral temperature is 98 F (36.7 C). Her blood pressure is 127/67 and her pulse is 58. Her respiration is 20.   Well-developed and well-nourished white female. Head and exam shows no ocular or oral lesions. She's able to move both eyes well. She has good extraocular muscle . There is no erythema or exudate from the left eye. There might be some slight fullness in the right axilla by do not detect any obvious adenopathy. I cannot detect any obvious right supraclavicular lymph nodes. She has improved range of motion of the right shoulder. Left supraclavicular region is okay. No masses are noted. Lungs are clear. Cardiac exam regular rate and rhythm with no murmurs, rubs or bruits.. Abdomen is soft. She has good bowel sounds. There is no palpable liver or spleen tip. Back exam shows decreased tenderness over the lumbar and thoracic spine to palpation. Extremities shows no clubbing, cyanosis or edema. She has 4/5 strength in her legs. Skin exam shows improvement in the radiation dermatitis of the right breast and  axilla. There is no open wound. There is some hyper pigmentation. Neurological exam is nonfocal.    Lab Results  Component Value Date   WBC 4.1 04/20/2016   HGB 12.1 04/20/2016   HCT 36.1 04/20/2016   MCV 87 04/20/2016   PLT 108* 04/20/2016     Chemistry      Component Value Date/Time   NA 136 04/20/2016 1013   NA 143 11/08/2015 1047   NA 138 04/30/2015 1019   K 3.6 04/20/2016 1013   K 4.0 11/08/2015 1047   K 3.5 04/30/2015 1019   CL 102 04/20/2016 1013   CL 110 11/08/2015 1047   CO2 30 04/20/2016 1013   CO2 24 11/08/2015 1047   CO2 29 04/30/2015 1019   BUN 10 04/20/2016 1013   BUN 6 11/08/2015 1047   BUN 9.7 04/30/2015 1019   CREATININE 0.9 04/20/2016 1013   CREATININE 0.78 11/08/2015 1047   CREATININE 0.8 04/30/2015 1019      Component Value Date/Time   CALCIUM 8.8 04/20/2016 1013   CALCIUM 9.4 11/08/2015 1047   CALCIUM 9.7 04/30/2015 1019   ALKPHOS 204* 04/20/2016 1013   ALKPHOS 257* 04/30/2015 1019   ALKPHOS 238* 03/06/2015 0425   AST 38 04/20/2016 1013   AST 57* 04/30/2015 1019   AST 41 03/06/2015 0425   ALT 24 04/20/2016 1013   ALT 41 04/30/2015 1019   ALT 38 03/06/2015 0425   BILITOT 0.60 04/20/2016 1013   BILITOT 0.60 04/30/2015 1019   BILITOT 0.4 03/06/2015 0425         Impression and Plan: Ms. Solis is 50 year old white female with triple positive metastatic breast cancer.  I am a lovely concern that the alkaline phosphatase is up. As such, we may have to consider a another PET scan for her area and I think this would be reasonable. I think this will let us know if things aren't progressing or stable.  I know she  feels quite tired. Some of this might be from just being on vacation down at her father's house.  I cannot find anything on her physical exam that is troublesome.  We will go ahead with her treatment today.  I will plan for another PET scan on her in July before we see her back.  If we find that she has progressive disease, then  we may have to consider holding on further treatment.  We may consider a another liver biopsy to try to get some up-to-date information on her tumor.  She has done so well. She has fought very hard. We've been at this for a couple years at least.  She will continue the Zometa.   [name], MD 6/29/201711:21 AM

## 2016-04-20 NOTE — Addendum Note (Signed)
Addended by: Burney Gauze R on: 04/20/2016 01:37 PM   Modules accepted: Orders

## 2016-04-20 NOTE — Patient Instructions (Signed)
[facility] Discharge Instructions for Patients Receiving Chemotherapy  Today you received the following chemotherapy agents Herceptin, Perjeta and Zometa.  To help prevent nausea and vomiting after your treatment, we encourage you to take your nausea medication.   If you develop nausea and vomiting that is not controlled by your nausea medication, call the clinic.   BELOW ARE SYMPTOMS THAT SHOULD BE REPORTED IMMEDIATELY:  *FEVER GREATER THAN 100.5 F  *CHILLS WITH OR WITHOUT FEVER  NAUSEA AND VOMITING THAT IS NOT CONTROLLED WITH YOUR NAUSEA MEDICATION  *UNUSUAL SHORTNESS OF BREATH  *UNUSUAL BRUISING OR BLEEDING  TENDERNESS IN MOUTH AND THROAT WITH OR WITHOUT PRESENCE OF ULCERS  *URINARY PROBLEMS  *BOWEL PROBLEMS  UNUSUAL RASH Items with * indicate a potential emergency and should be followed up as soon as possible.  Feel free to call the clinic you have any questions or concerns. The clinic phone number is [phone].  Please show the [facility] at check-in to the Emergency Department and triage nurse.

## 2016-04-21 DIAGNOSIS — J9601 Acute respiratory failure with hypoxia: Secondary | ICD-10-CM | POA: Diagnosis not present

## 2016-04-21 LAB — CANCER ANTIGEN 27.29: CA 27.29: 21.3 U/mL (ref 0.0–38.6)

## 2016-05-03 ENCOUNTER — Other Ambulatory Visit: Payer: Self-pay | Admitting: Hematology & Oncology

## 2016-05-03 ENCOUNTER — Encounter (HOSPITAL_COMMUNITY)
Admission: RE | Admit: 2016-05-03 | Discharge: 2016-05-03 | Disposition: A | Discharge status: Home / Self Care | Payer: 59 | Source: Ambulatory Visit | Attending: Hematology & Oncology | Admitting: Hematology & Oncology

## 2016-05-03 DIAGNOSIS — K909 Intestinal malabsorption, unspecified: Secondary | ICD-10-CM | POA: Insufficient documentation

## 2016-05-03 DIAGNOSIS — C50911 Malignant neoplasm of unspecified site of right female breast: Secondary | ICD-10-CM | POA: Insufficient documentation

## 2016-05-03 DIAGNOSIS — D696 Thrombocytopenia, unspecified: Secondary | ICD-10-CM | POA: Diagnosis not present

## 2016-05-03 DIAGNOSIS — C787 Secondary malignant neoplasm of liver and intrahepatic bile duct: Secondary | ICD-10-CM | POA: Diagnosis not present

## 2016-05-03 LAB — GLUCOSE, CAPILLARY: GLUCOSE-CAPILLARY: 90 mg/dL (ref 65–99)

## 2016-05-03 MED ORDER — FLUDEOXYGLUCOSE F - 18 (FDG) INJECTION
9.1000 | Freq: Once | INTRAVENOUS | Status: AC | PRN
  Administered 2016-05-03: 9.1 via INTRAVENOUS

## 2016-05-04 ENCOUNTER — Ambulatory Visit (HOSPITAL_BASED_OUTPATIENT_CLINIC_OR_DEPARTMENT_OTHER): Payer: 59

## 2016-05-04 ENCOUNTER — Ambulatory Visit: Payer: 59

## 2016-05-04 ENCOUNTER — Ambulatory Visit: Payer: 59 | Admitting: Family

## 2016-05-04 ENCOUNTER — Other Ambulatory Visit: Payer: 59

## 2016-05-04 DIAGNOSIS — C50911 Malignant neoplasm of unspecified site of right female breast: Secondary | ICD-10-CM

## 2016-05-04 DIAGNOSIS — C7931 Secondary malignant neoplasm of brain: Secondary | ICD-10-CM | POA: Diagnosis not present

## 2016-05-04 DIAGNOSIS — C50912 Malignant neoplasm of unspecified site of left female breast: Secondary | ICD-10-CM

## 2016-05-04 DIAGNOSIS — Z5111 Encounter for antineoplastic chemotherapy: Secondary | ICD-10-CM

## 2016-05-04 MED ORDER — FULVESTRANT 250 MG/5ML IM SOLN
INTRAMUSCULAR | Status: AC
  Filled 2016-05-04: qty 10

## 2016-05-04 MED ORDER — FULVESTRANT 250 MG/5ML IM SOLN
500.0000 mg | Freq: Once | INTRAMUSCULAR | Status: AC
  Administered 2016-05-04: 500 mg via INTRAMUSCULAR

## 2016-05-04 NOTE — Patient Instructions (Signed)

## 2016-05-11 ENCOUNTER — Other Ambulatory Visit: Payer: Self-pay

## 2016-05-11 ENCOUNTER — Other Ambulatory Visit (HOSPITAL_BASED_OUTPATIENT_CLINIC_OR_DEPARTMENT_OTHER): Payer: 59

## 2016-05-11 ENCOUNTER — Ambulatory Visit (HOSPITAL_BASED_OUTPATIENT_CLINIC_OR_DEPARTMENT_OTHER): Payer: 59 | Admitting: Hematology & Oncology

## 2016-05-11 ENCOUNTER — Encounter: Payer: Self-pay | Admitting: Hematology & Oncology

## 2016-05-11 ENCOUNTER — Ambulatory Visit (HOSPITAL_BASED_OUTPATIENT_CLINIC_OR_DEPARTMENT_OTHER): Payer: 59

## 2016-05-11 VITALS — BP 124/63 | HR 102 | Temp 98.1°F | Resp 18 | Ht 67.0 in | Wt 164.0 lb

## 2016-05-11 DIAGNOSIS — C50911 Malignant neoplasm of unspecified site of right female breast: Secondary | ICD-10-CM | POA: Diagnosis not present

## 2016-05-11 DIAGNOSIS — G4700 Insomnia, unspecified: Secondary | ICD-10-CM

## 2016-05-11 DIAGNOSIS — C50919 Malignant neoplasm of unspecified site of unspecified female breast: Secondary | ICD-10-CM

## 2016-05-11 DIAGNOSIS — D696 Thrombocytopenia, unspecified: Secondary | ICD-10-CM

## 2016-05-11 DIAGNOSIS — R350 Frequency of micturition: Secondary | ICD-10-CM

## 2016-05-11 DIAGNOSIS — Z95828 Presence of other vascular implants and grafts: Secondary | ICD-10-CM

## 2016-05-11 DIAGNOSIS — C50912 Malignant neoplasm of unspecified site of left female breast: Secondary | ICD-10-CM

## 2016-05-11 DIAGNOSIS — K909 Intestinal malabsorption, unspecified: Secondary | ICD-10-CM | POA: Diagnosis not present

## 2016-05-11 DIAGNOSIS — C7931 Secondary malignant neoplasm of brain: Secondary | ICD-10-CM

## 2016-05-11 DIAGNOSIS — Z5111 Encounter for antineoplastic chemotherapy: Secondary | ICD-10-CM

## 2016-05-11 DIAGNOSIS — R64 Cachexia: Secondary | ICD-10-CM

## 2016-05-11 DIAGNOSIS — R5383 Other fatigue: Secondary | ICD-10-CM

## 2016-05-11 DIAGNOSIS — M359 Systemic involvement of connective tissue, unspecified: Secondary | ICD-10-CM

## 2016-05-11 DIAGNOSIS — G4701 Insomnia due to medical condition: Secondary | ICD-10-CM

## 2016-05-11 DIAGNOSIS — K117 Disturbances of salivary secretion: Secondary | ICD-10-CM

## 2016-05-11 DIAGNOSIS — R112 Nausea with vomiting, unspecified: Secondary | ICD-10-CM

## 2016-05-11 DIAGNOSIS — J4530 Mild persistent asthma, uncomplicated: Secondary | ICD-10-CM

## 2016-05-11 DIAGNOSIS — M792 Neuralgia and neuritis, unspecified: Secondary | ICD-10-CM

## 2016-05-11 LAB — CMP (CANCER CENTER ONLY)
ALBUMIN: 3.9 g/dL (ref 3.3–5.5)
ALK PHOS: 199 U/L — AB (ref 26–84)
ALT: 117 U/L — AB (ref 10–47)
AST: 124 U/L — AB (ref 11–38)
BILIRUBIN TOTAL: 0.9 mg/dL (ref 0.20–1.60)
BUN, Bld: 11 mg/dL (ref 7–22)
CO2: 29 mEq/L (ref 18–33)
CREATININE: 0.9 mg/dL (ref 0.6–1.2)
Calcium: 9.1 mg/dL (ref 8.0–10.3)
Chloride: 100 mEq/L (ref 98–108)
Glucose, Bld: 106 mg/dL (ref 73–118)
Potassium: 3.4 mEq/L (ref 3.3–4.7)
SODIUM: 136 meq/L (ref 128–145)
Total Protein: 7.5 g/dL (ref 6.4–8.1)

## 2016-05-11 LAB — CBC WITH DIFFERENTIAL (CANCER CENTER ONLY)
BASO#: 0 10*3/uL (ref 0.0–0.2)
BASO%: 0.6 % (ref 0.0–2.0)
EOS%: 2.5 % (ref 0.0–7.0)
Eosinophils Absolute: 0.1 10*3/uL (ref 0.0–0.5)
HCT: 40.5 % (ref 34.8–46.6)
HGB: 13.3 g/dL (ref 11.6–15.9)
LYMPH#: 0.9 10*3/uL (ref 0.9–3.3)
LYMPH%: 24.8 % (ref 14.0–48.0)
MCH: 28.7 pg (ref 26.0–34.0)
MCHC: 32.8 g/dL (ref 32.0–36.0)
MCV: 87 fL (ref 81–101)
MONO#: 0.4 10*3/uL (ref 0.1–0.9)
MONO%: 9.6 % (ref 0.0–13.0)
NEUT#: 2.3 10*3/uL (ref 1.5–6.5)
NEUT%: 62.5 % (ref 39.6–80.0)
PLATELETS: 94 10*3/uL — AB (ref 145–400)
RBC: 4.64 10*6/uL (ref 3.70–5.32)
RDW: 14.4 % (ref 11.1–15.7)
WBC: 3.6 10*3/uL — AB (ref 3.9–10.0)

## 2016-05-11 LAB — LACTATE DEHYDROGENASE: LDH: 186 U/L (ref 125–245)

## 2016-05-11 LAB — IRON AND TIBC
%SAT: 43 % (ref 21–57)
IRON: 97 ug/dL (ref 41–142)
TIBC: 225 ug/dL — ABNORMAL LOW (ref 236–444)
UIBC: 128 ug/dL (ref 120–384)

## 2016-05-11 LAB — FERRITIN: Ferritin: 263 ng/ml (ref 9–269)

## 2016-05-11 MED ORDER — ACETAMINOPHEN 325 MG PO TABS
ORAL_TABLET | ORAL | Status: AC
  Filled 2016-05-11: qty 2

## 2016-05-11 MED ORDER — PILOCARPINE HCL 5 MG PO TABS: 5.0000 mg | ORAL_TABLET | Freq: Two times a day (BID) | ORAL | Status: DC

## 2016-05-11 MED ORDER — HYDROMORPHONE HCL 4 MG/ML IJ SOLN
INTRAMUSCULAR | Status: AC
  Filled 2016-05-11: qty 1

## 2016-05-11 MED ORDER — OXYBUTYNIN CHLORIDE 5 MG PO TABS: 5.0000 mg | ORAL_TABLET | Freq: Two times a day (BID) | ORAL | Status: DC

## 2016-05-11 MED ORDER — ALPRAZOLAM 1 MG PO TABS: 1.0000 mg | ORAL_TABLET | Freq: Two times a day (BID) | ORAL | Status: DC | PRN

## 2016-05-11 MED ORDER — SODIUM CHLORIDE 0.9 % IV SOLN
Freq: Once | INTRAVENOUS | Status: AC
  Administered 2016-05-11: 10:00:00 via INTRAVENOUS

## 2016-05-11 MED ORDER — HEPARIN SOD (PORK) LOCK FLUSH 100 UNIT/ML IV SOLN
500.0000 [IU] | Freq: Once | INTRAVENOUS | Status: AC | PRN
  Administered 2016-05-11: 500 [IU]
  Filled 2016-05-11: qty 5

## 2016-05-11 MED ORDER — ZOLEDRONIC ACID 4 MG/100ML IV SOLN
4.0000 mg | Freq: Once | INTRAVENOUS | Status: AC
  Administered 2016-05-11: 4 mg via INTRAVENOUS
  Filled 2016-05-11: qty 100

## 2016-05-11 MED ORDER — DIPHENHYDRAMINE HCL 25 MG PO CAPS
ORAL_CAPSULE | ORAL | Status: AC
  Filled 2016-05-11: qty 2

## 2016-05-11 MED ORDER — TRASTUZUMAB CHEMO 150 MG IV SOLR
6.0000 mg/kg | Freq: Once | INTRAVENOUS | Status: AC
  Administered 2016-05-11: 483 mg via INTRAVENOUS
  Filled 2016-05-11: qty 23

## 2016-05-11 MED ORDER — HYDROMORPHONE HCL 1 MG/ML IJ SOLN
4.0000 mg | INTRAMUSCULAR | Status: DC | PRN
  Administered 2016-05-11: 4 mg via INTRAVENOUS

## 2016-05-11 MED ORDER — METHYLPHENIDATE HCL 10 MG PO TABS: 10.0000 mg | ORAL_TABLET | Freq: Two times a day (BID) | ORAL | Status: AC

## 2016-05-11 MED ORDER — ACETAMINOPHEN 325 MG PO TABS
650.0000 mg | ORAL_TABLET | Freq: Once | ORAL | Status: AC
  Administered 2016-05-11: 650 mg via ORAL

## 2016-05-11 MED ORDER — PROCHLORPERAZINE MALEATE 10 MG PO TABS: 10.0000 mg | ORAL_TABLET | Freq: Four times a day (QID) | ORAL | Status: DC | PRN

## 2016-05-11 MED ORDER — PANTOPRAZOLE SODIUM 40 MG PO TBEC: 40.0000 mg | DELAYED_RELEASE_TABLET | Freq: Every day | ORAL | Status: DC

## 2016-05-11 MED ORDER — SODIUM CHLORIDE 0.9 % IV SOLN
420.0000 mg | Freq: Once | INTRAVENOUS | Status: AC
  Administered 2016-05-11: 420 mg via INTRAVENOUS
  Filled 2016-05-11: qty 14

## 2016-05-11 MED ORDER — METOCLOPRAMIDE HCL 10 MG PO TABS: 10.0000 mg | ORAL_TABLET | Freq: Four times a day (QID) | ORAL | Status: DC | PRN

## 2016-05-11 MED ORDER — SODIUM CHLORIDE 0.9 % IJ SOLN
10.0000 mL | INTRAMUSCULAR | Status: DC | PRN
  Administered 2016-05-11: 10 mL
  Filled 2016-05-11: qty 10

## 2016-05-11 MED ORDER — DIPHENHYDRAMINE HCL 25 MG PO CAPS
50.0000 mg | ORAL_CAPSULE | Freq: Once | ORAL | Status: AC
  Administered 2016-05-11: 50 mg via ORAL

## 2016-05-11 MED FILL — PANTOPRAZOLE SOD DR 40 MG T: 40 | 90 days supply | Qty: 90 | Fill #0

## 2016-05-11 MED FILL — PILOCARPINE HCL 5 MG TABLET: 5 | 30 days supply | Qty: 60 | Fill #0

## 2016-05-11 MED FILL — OXYBUTYNIN 5 MG TABLET: 5 | 30 days supply | Qty: 60 | Fill #0

## 2016-05-11 MED FILL — METHYLPHENIDATE 10 MG TAB: 10 | 30 days supply | Qty: 60 | Fill #0

## 2016-05-11 MED FILL — ALPRAZolam 1 MG TABS: 1 | 30 days supply | Qty: 60 | Fill #0

## 2016-05-11 NOTE — Patient Instructions (Signed)
[facility] Discharge Instructions for Patients Receiving Chemotherapy  Today you received the following chemotherapy agents Herceptin, Perjeta and Zometa.  To help prevent nausea and vomiting after your treatment, we encourage you to take your nausea medication.   If you develop nausea and vomiting that is not controlled by your nausea medication, call the clinic.   BELOW ARE SYMPTOMS THAT SHOULD BE REPORTED IMMEDIATELY:  *FEVER GREATER THAN 100.5 F  *CHILLS WITH OR WITHOUT FEVER  NAUSEA AND VOMITING THAT IS NOT CONTROLLED WITH YOUR NAUSEA MEDICATION  *UNUSUAL SHORTNESS OF BREATH  *UNUSUAL BRUISING OR BLEEDING  TENDERNESS IN MOUTH AND THROAT WITH OR WITHOUT PRESENCE OF ULCERS  *URINARY PROBLEMS  *BOWEL PROBLEMS  UNUSUAL RASH Items with * indicate a potential emergency and should be followed up as soon as possible.  Feel free to call the clinic you have any questions or concerns. The clinic phone number is [phone].  Please show the [facility] at check-in to the Emergency Department and triage nurse.

## 2016-05-11 NOTE — Progress Notes (Signed)
Reviewed medication list with patient due to concerns about symptoms of tardive dyskinesia. Pt has not been taking Protonix as noted on list. Has been using Reglan ac & hs. Also is utilizing Zofran, Phenergan, Compazine and Ativan for nausea.  She generally tries Zofran first, then using Phenergan for breakthrough nausea. If nausea persists after these 2, Compazine is used.   Pt is willing to re-try using Protonix instead of Reglan and remove Compazine from her nausea regimen, using Ativan instead. New script for Protonix sent to pharmacy. [facility] pharmacist aware of new plan & no need to refill Reglan or Compazine. dph

## 2016-05-11 NOTE — Progress Notes (Signed)
Hematology and Oncology Follow Up Visit  Bianca Kent [id number] 14-Sep-1966 50 y.o. 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 is back for followup . She now is on Faslodex. She's doing well with this. She did have some hip pain after the first couple shots. I don't think this is that unexpected. I told her to try some Claritin. This may help.  She was able to go up to [place] to see her sister for July 4 holiday. She really had a good time. She was able to eat without problems. She is able to go into the [facility]. She really had a nice time.   Our big news is that the PET scan that she had done recently really show that she was responding. Her liver metastases was less. There may have been a metastasis in the left lung but nothing correlated on the CT scan. Otherwise, the PET scan really looked good.   Her last CA-27-29 was holding steady at 21.2.  She still has pain issues. She is on a fentanyl patch. So the pain issues are more chronic than anything else.  She's had no problems with bleeding or bruising. She still has some constipation.  She is taking some supplements to try to help with her nutrition. She gets full pretty quickly. She does take some marijuana which I think would be a good idea for her.   Overall, her performance status is ECOG 2.    Medications:  Current outpatient prescriptions:  .  albuterol (PROVENTIL HFA;VENTOLIN HFA) 108 (90 BASE) MCG/ACT inhaler, Inhale 2 puffs into the lungs every 4 (four) hours as needed for wheezing or shortness of breath., Disp: 2 Inhaler, Rfl: 6 .  aspirin (ASPIRIN EC) 81 MG EC tablet, Take 81 mg by mouth daily. Swallow whole., Disp: , Rfl:  .  Bismuth  Subsalicylate (PEPTO-BISMOL PO), Take 1 tablet by mouth as needed (upset stomach). , Disp: , Rfl:  .  calcium carbonate (TUMS - DOSED IN MG ELEMENTAL CALCIUM) 500 MG chewable tablet, Chew 2 tablets by mouth 2 (two) times daily., Disp: , Rfl:  .  diphenhydrAMINE (SOMINEX) 25 MG tablet, Take 100 mg by mouth at bedtime as needed for sleep. Pt states she is taking up to 200 mg Benadryl at night and still not sleeping, Disp: , Rfl:  .  dronabinol (MARINOL) 10 MG capsule, Take 1 capsule (10 mg total) by mouth 3 (three) times daily before meals., Disp: 90 capsule, Rfl: 0 .  ergocalciferol (VITAMIN D2) 50000 UNITS capsule, Take 1 capsule (50,000 Units total) by mouth 2 (two) times a week., Disp: 8 capsule, Rfl: 6 .  fentaNYL (DURAGESIC - DOSED MCG/HR) 100 MCG/HR, Place 1 patch (100 mcg total) onto the skin every other day., Disp: 15 patch, Rfl: 0 .  fluconazole (DIFLUCAN) 150 MG tablet, Take 1 tablet (150 mg total) by mouth once., Disp: 1 tablet, Rfl: 3 .  HYDROmorphone (DILAUDID) 4 MG tablet, Take 1 tablet (4 mg total) by mouth every 4 (four) hours as needed for severe pain., Disp: 90 tablet, Rfl: 0 .  Lactulose SOLN, Take 2 TBLSP every 6 hours until bowel movement (Patient taking differently: Take 30 mLs by mouth every  6 (six) hours as needed. Take 2 TBLSP every 6 hours until bowel movement), Disp: 1000 mL, Rfl: 4 .  lidocaine (LIDODERM) 5 %, Place 1 patch onto the skin daily. Remove & Discard patch within 12 hours or as directed by MD, Disp: 20 patch, Rfl: 0 .  lidocaine (XYLOCAINE) 2 % solution, Use as directed 20 mLs in the mouth or throat as needed for mouth pain., Disp: 200 mL, Rfl: 2 .  lidocaine-prilocaine (EMLA) cream, Apply 1 application topically as needed., Disp: 30 g, Rfl: 6 .  loratadine-pseudoephedrine (CLARITIN-D 24-HOUR) 10-240 MG per 24 hr tablet, Take 1 tablet by mouth daily., Disp: , Rfl:  .  LORazepam (ATIVAN) 1 MG tablet, Place 1 tablet under the tongue, IF NEEDED, every 6 hours for  nausea., Disp: 90 tablet, Rfl: 2 .  magic mouthwash w/lidocaine SOLN, Take 5 mLs by mouth 4 (four) times daily as needed for mouth pain., Disp: 600 mL, Rfl: 4 .  Multiple Vitamin (MULTIVITAMIN) tablet, Take 1 tablet by mouth daily., Disp: , Rfl:  .  naloxegol oxalate (MOVANTIK) 25 MG TABS tablet, Take 1 tablet (25 mg total) by mouth daily., Disp: 30 tablet, Rfl: 6 .  NONFORMULARY OR COMPOUNDED ITEM, Take 5 mLs by mouth as needed (irritation). DUKES MOUTHWASH  SWISH AND SWALLOW, Disp: , Rfl:  .  Nystatin POWD, 1 application by Does not apply route 2 (two) times daily., Disp: 1 Bottle, Rfl: 3 .  OLANZapine (ZYPREXA) 5 MG tablet, Take 1 tablet (5 mg total) by mouth at bedtime., Disp: 30 tablet, Rfl: 3 .  ondansetron (ZOFRAN) 8 MG tablet, Take 1 tablet (8 mg total) by mouth every 8 (eight) hours as needed for nausea or vomiting., Disp: 60 tablet, Rfl: 2 .  pantoprazole (PROTONIX) 40 MG tablet, Take 1 tablet (40 mg total) by mouth daily., Disp: 30 tablet, Rfl: 6 .  promethazine (PHENERGAN) 25 MG suppository, Place 1 suppository (25 mg total) rectally every 6 (six) hours as needed for nausea or vomiting., Disp: 60 each, Rfl: 3 .  Sennosides-Docusate Sodium (SENNA-S PO), Take 1 tablet by mouth as needed., Disp: , Rfl:  .  sucralfate (CARAFATE) 1 g tablet, Take 1 g by mouth daily., Disp: , Rfl: 1 .  sulfamethoxazole-trimethoprim (BACTRIM DS,SEPTRA DS) 800-160 MG tablet, Take 1 tablet by mouth 2 (two) times daily., Disp: 14 tablet, Rfl: 0 .  traZODone (DESYREL) 100 MG tablet, TAKE 1 TO 2 TABLETS BY MOUTH AS NEEDED FOR SLEEP, Disp: 60 tablet, Rfl: 2 .  ALPRAZolam (XANAX) 1 MG tablet, Take 1 tablet (1 mg total) by mouth 2 (two) times daily as needed for anxiety., Disp: 60 tablet, Rfl: 2 .  methylphenidate (RITALIN) 10 MG tablet, Take 1 tablet (10 mg total) by mouth 2 (two) times daily., Disp: 60 tablet, Rfl: 0 .  metoCLOPramide (REGLAN) 10 MG tablet, Take 1 tablet (10 mg total) by mouth every 6 (six) hours as  needed for nausea., Disp: 120 tablet, Rfl: 8 .  oxybutynin (DITROPAN) 5 MG tablet, Take 1 tablet (5 mg total) by mouth 2 (two) times daily., Disp: 60 tablet, Rfl: 0 .  pilocarpine (SALAGEN) 5 MG tablet, Take 1 tablet (5 mg total) by mouth 2 (two) times daily., Disp: 60 tablet, Rfl: 3 .  prochlorperazine (COMPAZINE) 10 MG tablet, Take 1 tablet (10 mg total) by mouth every 6 (six) hours as needed for nausea or vomiting., Disp: 60 tablet, Rfl: 2 No current facility-administered medications for this visit.  Facility-Administered Medications Ordered in  Other Visits:  .  HYDROmorphone (DILAUDID) injection 2 mg, 2 mg, Intravenous, Q4H PRN, [name], MD, 2 mg at 04/20/16 1233  Allergies:  Allergies  Allergen Reactions  . Iohexol      Code: RASH, Desc: VERY STRONG FAMILY HX OF ANGIOEDEMA WHEN RECEIVING IV CONTRAST; PT HAS BEEN PREMEDICATED FOR OTHER CONTRASTED STUDIES(IN CATH. LAB)  KR, Onset Date: [id number]   . Prednisone Itching    Capillary beds bust  . Tetanus Toxoids Other (See Comments)    Ran a high fever for 48 hours  . Theophyllines Hives    Mental changes  . Versed [Midazolam] Other (See Comments)    Pt becomes violent    Past Medical History, Surgical history, Social history, and Family History were reviewed and updated.  Review of Systems: As above  Physical Exam:  height is _0  (1.702 m) and weight is 164 lb (74.39 kg). Her oral temperature is 98.1 F (36.7 C). Her blood pressure is 124/63 and her pulse is 102. Her respiration is 18.   Well-developed and well-nourished white female. Head and exam shows no ocular or oral lesions. She's able to move both eyes well. She has good extraocular muscle . There is no erythema or exudate from the left eye. There might be some slight fullness in the right axilla by do not detect any obvious adenopathy. I cannot detect any obvious right supraclavicular lymph nodes. She has improved range of motion of the right shoulder. Left  supraclavicular region is okay. No masses are noted. Lungs are clear. Cardiac exam regular rate and rhythm with no murmurs, rubs or bruits.. Abdomen is soft. She has good bowel sounds. There is no palpable liver or spleen tip. Back exam shows decreased tenderness over the lumbar and thoracic spine to palpation. Extremities shows no clubbing, cyanosis or edema. She has 4/5 strength in her legs. Skin exam shows improvement in the radiation dermatitis of the right breast and axilla. There is no open wound. There is some hyper pigmentation. Neurological exam is nonfocal.    Lab Results  Component Value Date   WBC 3.6* 05/11/2016   HGB 13.3 05/11/2016   HCT 40.5 05/11/2016   MCV 87 05/11/2016   PLT 94* 05/11/2016     Chemistry      Component Value Date/Time   NA 136 05/11/2016 0912   NA 143 11/08/2015 1047   NA 138 04/30/2015 1019   K 3.4 05/11/2016 0912   K 4.0 11/08/2015 1047   K 3.5 04/30/2015 1019   CL 100 05/11/2016 0912   CL 110 11/08/2015 1047   CO2 29 05/11/2016 0912   CO2 24 11/08/2015 1047   CO2 29 04/30/2015 1019   BUN 11 05/11/2016 0912   BUN 6 11/08/2015 1047   BUN 9.7 04/30/2015 1019   CREATININE 0.9 05/11/2016 0912   CREATININE 0.78 11/08/2015 1047   CREATININE 0.8 04/30/2015 1019      Component Value Date/Time   CALCIUM 9.1 05/11/2016 0912   CALCIUM 9.4 11/08/2015 1047   CALCIUM 9.7 04/30/2015 1019   ALKPHOS 199* 05/11/2016 0912   ALKPHOS 257* 04/30/2015 1019   ALKPHOS 238* 03/06/2015 0425   AST 124* 05/11/2016 0912   AST 57* 04/30/2015 1019   AST 41 03/06/2015 0425   ALT 117* 05/11/2016 0912   ALT 41 04/30/2015 1019   ALT 38 03/06/2015 0425   BILITOT 0.90 05/11/2016 0912   BILITOT 0.60 04/30/2015 1019   BILITOT 0.4 03/06/2015 0425  Impression and Plan: Bianca Kent is 50 year old white female with triple positive metastatic breast cancer.  I am Happy that she was able to go to [place] and have a good time up there.  She will be going to  [facility] next weekend to see family. I'm glad that she is able to drive a little bit more. This really helps her feeling of being more independent.   I'm not sure why her LFTs are a little bit elevated. I would not think this be from her medications. I think we will have to watch these for right now.   Hopefully, we might be able to use one of the new FDA approved agents for HER-2 positive breast cancer- Neratinib. I think this might be approved for right now for early-stage breast cancer.  Overall, I really think that she is done well. I really believe that she is maintaining a decent quality of life which is nice to see.   We will plan to get her back in another 3 weeks.   She will continue the Zometa.   [name], MD 7/20/201710:21 AM

## 2016-05-12 LAB — RETICULOCYTES: RETICULOCYTE COUNT: 0.6 % (ref 0.6–2.6)

## 2016-05-12 LAB — CANCER ANTIGEN 27.29: CA 27.29: 19.2 U/mL (ref 0.0–38.6)

## 2016-05-15 ENCOUNTER — Other Ambulatory Visit: Payer: Self-pay | Admitting: *Deleted

## 2016-05-15 ENCOUNTER — Telehealth: Payer: Self-pay | Admitting: *Deleted

## 2016-05-15 DIAGNOSIS — R52 Pain, unspecified: Secondary | ICD-10-CM | POA: Diagnosis not present

## 2016-05-15 MED ORDER — FENTANYL 50 MCG/HR TD PT72: 50.0000 ug | MEDICATED_PATCH | TRANSDERMAL | 0 refills | Status: DC

## 2016-05-15 MED ORDER — FLUCONAZOLE 100 MG PO TABS: 100.0000 mg | ORAL_TABLET | Freq: Every day | ORAL | 0 refills | Status: AC

## 2016-05-15 MED FILL — FLUCONAZOLE 100 MG TABLET: 100 | 7 days supply | Qty: 7 | Fill #0

## 2016-05-15 MED FILL — fentaNYL 50 MCG/HR PT72: 50 | 30 days supply | Qty: 10 | Fill #0

## 2016-05-15 NOTE — Addendum Note (Signed)
Addended by: Burney Gauze R on: 05/15/2016 04:51 PM   Modules accepted: Orders

## 2016-05-15 NOTE — Telephone Encounter (Signed)
NP from Palliative Care called stating that he saw patient in the home today. Having a great deal more pain.  Increased Fentanyl to 150 mcg.  [name] (sister) called stating that her sister also has thrush in the mouth.  Dr. [name] notified. Orders received.

## 2016-05-18 ENCOUNTER — Ambulatory Visit (HOSPITAL_COMMUNITY): Payer: 59

## 2016-05-21 DIAGNOSIS — J9601 Acute respiratory failure with hypoxia: Secondary | ICD-10-CM | POA: Diagnosis not present

## 2016-05-22 ENCOUNTER — Ambulatory Visit (HOSPITAL_COMMUNITY)
Admission: RE | Admit: 2016-05-22 | Discharge: 2016-05-22 | Disposition: A | Discharge status: Home / Self Care | Payer: 59 | Source: Ambulatory Visit | Attending: Hematology & Oncology | Admitting: Hematology & Oncology

## 2016-05-22 DIAGNOSIS — C50911 Malignant neoplasm of unspecified site of right female breast: Secondary | ICD-10-CM

## 2016-05-22 DIAGNOSIS — R0600 Dyspnea, unspecified: Secondary | ICD-10-CM | POA: Diagnosis not present

## 2016-05-22 DIAGNOSIS — I340 Nonrheumatic mitral (valve) insufficiency: Secondary | ICD-10-CM | POA: Insufficient documentation

## 2016-05-22 NOTE — Progress Notes (Signed)
*  PRELIMINARY RESULTS* Echocardiogram 2D Echocardiogram has been performed.  Bianca Kent 05/22/2016, 11:59 AM

## 2016-05-23 ENCOUNTER — Encounter: Payer: Self-pay | Admitting: *Deleted

## 2016-05-25 ENCOUNTER — Other Ambulatory Visit: Payer: 59

## 2016-05-25 ENCOUNTER — Ambulatory Visit: Payer: 59 | Admitting: Hematology & Oncology

## 2016-05-25 ENCOUNTER — Ambulatory Visit: Payer: 59

## 2016-06-01 ENCOUNTER — Ambulatory Visit (HOSPITAL_BASED_OUTPATIENT_CLINIC_OR_DEPARTMENT_OTHER): Payer: 59

## 2016-06-01 ENCOUNTER — Other Ambulatory Visit: Payer: Self-pay | Admitting: Family

## 2016-06-01 ENCOUNTER — Encounter: Payer: Self-pay | Admitting: Hematology & Oncology

## 2016-06-01 ENCOUNTER — Other Ambulatory Visit: Payer: Self-pay | Admitting: Nurse Practitioner

## 2016-06-01 ENCOUNTER — Other Ambulatory Visit (HOSPITAL_BASED_OUTPATIENT_CLINIC_OR_DEPARTMENT_OTHER): Payer: 59

## 2016-06-01 ENCOUNTER — Ambulatory Visit (HOSPITAL_BASED_OUTPATIENT_CLINIC_OR_DEPARTMENT_OTHER): Payer: 59 | Admitting: Hematology & Oncology

## 2016-06-01 VITALS — BP 111/72 | HR 75 | Temp 97.4°F | Resp 18 | Ht 67.0 in | Wt 163.0 lb

## 2016-06-01 DIAGNOSIS — C50911 Malignant neoplasm of unspecified site of right female breast: Secondary | ICD-10-CM

## 2016-06-01 DIAGNOSIS — Z5111 Encounter for antineoplastic chemotherapy: Secondary | ICD-10-CM

## 2016-06-01 DIAGNOSIS — C7931 Secondary malignant neoplasm of brain: Secondary | ICD-10-CM | POA: Diagnosis not present

## 2016-06-01 DIAGNOSIS — R64 Cachexia: Secondary | ICD-10-CM

## 2016-06-01 DIAGNOSIS — C50919 Malignant neoplasm of unspecified site of unspecified female breast: Secondary | ICD-10-CM

## 2016-06-01 DIAGNOSIS — R112 Nausea with vomiting, unspecified: Secondary | ICD-10-CM

## 2016-06-01 DIAGNOSIS — R110 Nausea: Secondary | ICD-10-CM

## 2016-06-01 DIAGNOSIS — G4701 Insomnia due to medical condition: Secondary | ICD-10-CM

## 2016-06-01 DIAGNOSIS — C50912 Malignant neoplasm of unspecified site of left female breast: Secondary | ICD-10-CM

## 2016-06-01 DIAGNOSIS — R5383 Other fatigue: Secondary | ICD-10-CM

## 2016-06-01 LAB — CBC WITH DIFFERENTIAL (CANCER CENTER ONLY)
BASO#: 0 10*3/uL (ref 0.0–0.2)
BASO%: 0.2 % (ref 0.0–2.0)
EOS%: 2.3 % (ref 0.0–7.0)
Eosinophils Absolute: 0.1 10*3/uL (ref 0.0–0.5)
HCT: 37 % (ref 34.8–46.6)
HGB: 12.5 g/dL (ref 11.6–15.9)
LYMPH#: 1 10*3/uL (ref 0.9–3.3)
LYMPH%: 23 % (ref 14.0–48.0)
MCH: 28.7 pg (ref 26.0–34.0)
MCHC: 33.8 g/dL (ref 32.0–36.0)
MCV: 85 fL (ref 81–101)
MONO#: 0.5 10*3/uL (ref 0.1–0.9)
MONO%: 10.7 % (ref 0.0–13.0)
NEUT%: 63.8 % (ref 39.6–80.0)
NEUTROS ABS: 2.8 10*3/uL (ref 1.5–6.5)
PLATELETS: 136 10*3/uL — AB (ref 145–400)
RBC: 4.36 10*6/uL (ref 3.70–5.32)
RDW: 14.3 % (ref 11.1–15.7)
WBC: 4.4 10*3/uL (ref 3.9–10.0)

## 2016-06-01 LAB — CMP (CANCER CENTER ONLY)
ALT(SGPT): 53 U/L — ABNORMAL HIGH (ref 10–47)
AST: 31 U/L (ref 11–38)
Albumin: 3.5 g/dL (ref 3.3–5.5)
Alkaline Phosphatase: 244 U/L — ABNORMAL HIGH (ref 26–84)
BILIRUBIN TOTAL: 0.7 mg/dL (ref 0.20–1.60)
BUN: 6 mg/dL — AB (ref 7–22)
CO2: 31 meq/L (ref 18–33)
CREATININE: 0.9 mg/dL (ref 0.6–1.2)
Calcium: 8.8 mg/dL (ref 8.0–10.3)
Chloride: 99 mEq/L (ref 98–108)
GLUCOSE: 89 mg/dL (ref 73–118)
Potassium: 3.8 mEq/L (ref 3.3–4.7)
SODIUM: 135 meq/L (ref 128–145)
Total Protein: 7.4 g/dL (ref 6.4–8.1)

## 2016-06-01 MED ORDER — DIPHENHYDRAMINE HCL 25 MG PO CAPS
50.0000 mg | ORAL_CAPSULE | Freq: Once | ORAL | Status: AC
  Administered 2016-06-01: 50 mg via ORAL

## 2016-06-01 MED ORDER — FULVESTRANT 250 MG/5ML IM SOLN
500.0000 mg | Freq: Once | INTRAMUSCULAR | Status: AC
  Administered 2016-06-01: 500 mg via INTRAMUSCULAR

## 2016-06-01 MED ORDER — ZOLEDRONIC ACID 4 MG/100ML IV SOLN
4.0000 mg | Freq: Once | INTRAVENOUS | Status: AC
  Administered 2016-06-01: 4 mg via INTRAVENOUS
  Filled 2016-06-01: qty 100

## 2016-06-01 MED ORDER — ACETAMINOPHEN 325 MG PO TABS
ORAL_TABLET | ORAL | Status: AC
  Filled 2016-06-01: qty 2

## 2016-06-01 MED ORDER — HEPARIN SOD (PORK) LOCK FLUSH 100 UNIT/ML IV SOLN
500.0000 [IU] | Freq: Once | INTRAVENOUS | Status: AC
  Administered 2016-06-01: 500 [IU] via INTRAVENOUS
  Filled 2016-06-01: qty 5

## 2016-06-01 MED ORDER — SODIUM CHLORIDE 0.9 % IV SOLN
Freq: Once | INTRAVENOUS | Status: AC
  Administered 2016-06-01: 12:00:00 via INTRAVENOUS
  Filled 2016-06-01: qty 5

## 2016-06-01 MED ORDER — DIPHENHYDRAMINE HCL 25 MG PO CAPS
ORAL_CAPSULE | ORAL | Status: AC
  Filled 2016-06-01: qty 1

## 2016-06-01 MED ORDER — ERGOCALCIFEROL 1.25 MG (50000 UT) PO CAPS: 50000.0000 [IU] | ORAL_CAPSULE | ORAL | 6 refills | Status: AC

## 2016-06-01 MED ORDER — TRASTUZUMAB CHEMO INJECTION 440 MG
6.0000 mg/kg | Freq: Once | INTRAVENOUS | Status: AC
  Administered 2016-06-01: 483 mg via INTRAVENOUS
  Filled 2016-06-01: qty 14.28

## 2016-06-01 MED ORDER — HYDROMORPHONE HCL 4 MG PO TABS
4.0000 mg | ORAL_TABLET | ORAL | 0 refills | Status: DC | PRN
Start: 2016-06-01 — End: 2016-08-31

## 2016-06-01 MED ORDER — SODIUM CHLORIDE 0.9 % IV SOLN
Freq: Once | INTRAVENOUS | Status: AC
  Administered 2016-06-01: 11:00:00 via INTRAVENOUS

## 2016-06-01 MED ORDER — FOSAPREPITANT DIMEGLUMINE INJECTION 150 MG: Freq: Once | INTRAVENOUS | Status: DC

## 2016-06-01 MED ORDER — SODIUM CHLORIDE 0.9 % IV SOLN: Freq: Once | INTRAVENOUS | Status: DC

## 2016-06-01 MED ORDER — SODIUM CHLORIDE 0.9 % IV SOLN
Freq: Once | INTRAVENOUS | Status: AC
  Administered 2016-06-01: 11:00:00 via INTRAVENOUS
  Filled 2016-06-01: qty 8

## 2016-06-01 MED ORDER — FULVESTRANT 250 MG/5ML IM SOLN
INTRAMUSCULAR | Status: AC
  Filled 2016-06-01: qty 10

## 2016-06-01 MED ORDER — TRAZODONE HCL 100 MG PO TABS: ORAL_TABLET | ORAL | 2 refills | Status: AC

## 2016-06-01 MED ORDER — SODIUM CHLORIDE 0.9% FLUSH
10.0000 mL | INTRAVENOUS | Status: DC | PRN
  Administered 2016-06-01: 10 mL via INTRAVENOUS
  Filled 2016-06-01: qty 10

## 2016-06-01 MED ORDER — ACETAMINOPHEN 325 MG PO TABS
650.0000 mg | ORAL_TABLET | Freq: Once | ORAL | Status: AC
  Administered 2016-06-01: 650 mg via ORAL

## 2016-06-01 MED ORDER — GRANISETRON 3.1 MG/24HR TD PTCH: MEDICATED_PATCH | TRANSDERMAL | 3 refills | Status: DC

## 2016-06-01 MED ORDER — SODIUM CHLORIDE 0.9 % IV SOLN
420.0000 mg | Freq: Once | INTRAVENOUS | Status: AC
  Administered 2016-06-01: 420 mg via INTRAVENOUS
  Filled 2016-06-01: qty 14

## 2016-06-01 MED FILL — SANCUSO 3.1 MG/24 HR PATCH: 3.1 | 28 days supply | Qty: 4 | Fill #0

## 2016-06-01 MED FILL — VIT D2 1.25 MG (50,000 UNIT: 1.25 MG | 28 days supply | Qty: 8 | Fill #0

## 2016-06-01 MED FILL — traZODone HCL 100 MG TABS: 100 | 30 days supply | Qty: 60 | Fill #0

## 2016-06-01 MED FILL — HYDROmorphone HCL 4 MG TABS: 4 | 15 days supply | Qty: 90 | Fill #0

## 2016-06-01 NOTE — Patient Instructions (Signed)
Fulvestrant injection What is this medicine? FULVESTRANT (ful VES trant) blocks the effects of estrogen. It is used to treat breast cancer. This medicine may be used for other purposes; ask your health care provider or pharmacist if you have questions. What should I tell my health care provider before I take this medicine? They need to know if you have any of these conditions: -bleeding problems -liver disease -low levels of platelets in the blood -an unusual or allergic reaction to fulvestrant, other medicines, foods, dyes, or preservatives -pregnant or trying to get pregnant -breast-feeding How should I use this medicine? This medicine is for injection into a muscle. It is usually given by a health care professional in a hospital or clinic setting. Talk to your pediatrician regarding the use of this medicine in children. Special care may be needed. Overdosage: If you think you have taken too much of this medicine contact a poison control center or emergency room at once. NOTE: This medicine is only for you. Do not share this medicine with others. What if I miss a dose? It is important not to miss your dose. Call your doctor or health care professional if you are unable to keep an appointment. What may interact with this medicine? -medicines that treat or prevent blood clots like warfarin, enoxaparin, and dalteparin This list may not describe all possible interactions. Give your health care provider a list of all the medicines, herbs, non-prescription drugs, or dietary supplements you use. Also tell them if you smoke, drink alcohol, or use illegal drugs. Some items may interact with your medicine. What should I watch for while using this medicine? Your condition will be monitored carefully while you are receiving this medicine. You will need important blood work done while you are taking this medicine. Do not become pregnant while taking this medicine or for at least 1 year after stopping  it. Women of child-bearing potential will need to have a negative pregnancy test before starting this medicine. Women should inform their doctor if they wish to become pregnant or think they might be pregnant. There is a potential for serious side effects to an unborn child. Men should inform their doctors if they wish to father a child. This medicine may lower sperm counts. Talk to your health care professional or pharmacist for more information. Do not breast-feed an infant while taking this medicine or for 1 year after the last dose. What side effects may I notice from receiving this medicine? Side effects that you should report to your doctor or health care professional as soon as possible: -allergic reactions like skin rash, itching or hives, swelling of the face, lips, or tongue -feeling faint or lightheaded, falls -pain, tingling, numbness, or weakness in the legs -signs and symptoms of infection like fever or chills; cough; flu-like symptoms; sore throat -vaginal bleeding Side effects that usually do not require medical attention (report to your doctor or health care professional if they continue or are bothersome): -aches, pains -constipation -diarrhea -headache -hot flashes -nausea, vomiting -pain at site where injected -stomach pain This list may not describe all possible side effects. Call your doctor for medical advice about side effects. You may report side effects to FDA at 1-800-FDA-1088. Where should I keep my medicine? This drug is given in a hospital or clinic and will not be stored at home. NOTE: This sheet is a summary. It may not cover all possible information. If you have questions about this medicine, talk to your doctor, pharmacist, or health  care provider.    2016, [employer]. (2015-05-07 11:03:55) Pertuzumab injection What is this medicine? PERTUZUMAB (per TOOZ ue mab) is a monoclonal antibody. It is used to treat breast cancer. This medicine may be  used for other purposes; ask your health care provider or pharmacist if you have questions. What should I tell my health care provider before I take this medicine? They need to know if you have any of these conditions: -heart disease -heart failure -high blood pressure -history of irregular heart beat -recent or ongoing radiation therapy -an unusual or allergic reaction to pertuzumab, other medicines, foods, dyes, or preservatives -pregnant or trying to get pregnant -breast-feeding How should I use this medicine? This medicine is for infusion into a vein. It is given by a health care professional in a hospital or clinic setting. Talk to your pediatrician regarding the use of this medicine in children. Special care may be needed. Overdosage: If you think you have taken too much of this medicine contact a poison control center or emergency room at once. NOTE: This medicine is only for you. Do not share this medicine with others. What if I miss a dose? It is important not to miss your dose. Call your doctor or health care professional if you are unable to keep an appointment. What may interact with this medicine? Interactions are not expected. Give your health care provider a list of all the medicines, herbs, non-prescription drugs, or dietary supplements you use. Also tell them if you smoke, drink alcohol, or use illegal drugs. Some items may interact with your medicine. This list may not describe all possible interactions. Give your health care provider a list of all the medicines, herbs, non-prescription drugs, or dietary supplements you use. Also tell them if you smoke, drink alcohol, or use illegal drugs. Some items may interact with your medicine. What should I watch for while using this medicine? Your condition will be monitored carefully while you are receiving this medicine. Report any side effects. Continue your course of treatment even though you feel ill unless your doctor tells you  to stop. Do not become pregnant while taking this medicine or for 7 months after stopping it. Women should inform their doctor if they wish to become pregnant or think they might be pregnant. Women of child-bearing potential will need to have a negative pregnancy test before starting this medicine. There is a potential for serious side effects to an unborn child. Talk to your health care professional or pharmacist for more information. Do not breast-feed an infant while taking this medicine or for 7 months after stopping it. Women must use effective birth control with this medicine. Call your doctor or health care professional for advice if you get a fever, chills or sore throat, or other symptoms of a cold or flu. Do not treat yourself. Try to avoid being around people who are sick. You may experience fever, chills, and headache during the infusion. Report any side effects during the infusion to your health care professional. What side effects may I notice from receiving this medicine? Side effects that you should report to your doctor or health care professional as soon as possible: -breathing problems -chest pain or palpitations -dizziness -feeling faint or lightheaded -fever or chills -skin rash, itching or hives -sore throat -swelling of the face, lips, or tongue -swelling of the legs or ankles -unusually weak or tired Side effects that usually do not require medical attention (Report these to your doctor or health care professional  if they continue or are bothersome.): -diarrhea -hair loss -nausea, vomiting -tiredness This list may not describe all possible side effects. Call your doctor for medical advice about side effects. You may report side effects to FDA at 1-800-FDA-1088. Where should I keep my medicine? This drug is given in a hospital or clinic and will not be stored at home. NOTE: This sheet is a summary. It may not cover all possible information. If you have questions about  this medicine, talk to your doctor, pharmacist, or health care provider.    2016, [employer]. (2015-01-15 16:07:57) Trastuzumab injection for infusion What is this medicine? TRASTUZUMAB (tras TOO zoo mab) is a monoclonal antibody. It is used to treat breast cancer and stomach cancer. This medicine may be used for other purposes; ask your health care provider or pharmacist if you have questions. What should I tell my health care provider before I take this medicine? They need to know if you have any of these conditions: -heart disease -heart failure -infection (especially a virus infection such as chickenpox, cold sores, or herpes) -lung or breathing disease, like asthma -recent or ongoing radiation therapy -an unusual or allergic reaction to trastuzumab, benzyl alcohol, or other medications, foods, dyes, or preservatives -pregnant or trying to get pregnant -breast-feeding How should I use this medicine? This drug is given as an infusion into a vein. It is administered in a hospital or clinic by a specially trained health care professional. Talk to your pediatrician regarding the use of this medicine in children. This medicine is not approved for use in children. Overdosage: If you think you have taken too much of this medicine contact a poison control center or emergency room at once. NOTE: This medicine is only for you. Do not share this medicine with others. What if I miss a dose? It is important not to miss a dose. Call your doctor or health care professional if you are unable to keep an appointment. What may interact with this medicine? -doxorubicin -warfarin This list may not describe all possible interactions. Give your health care provider a list of all the medicines, herbs, non-prescription drugs, or dietary supplements you use. Also tell them if you smoke, drink alcohol, or use illegal drugs. Some items may interact with your medicine. What should I watch for while  using this medicine? Visit your doctor for checks on your progress. Report any side effects. Continue your course of treatment even though you feel ill unless your doctor tells you to stop. Call your doctor or health care professional for advice if you get a fever, chills or sore throat, or other symptoms of a cold or flu. Do not treat yourself. Try to avoid being around people who are sick. You may experience fever, chills and shaking during your first infusion. These effects are usually mild and can be treated with other medicines. Report any side effects during the infusion to your health care professional. Fever and chills usually do not happen with later infusions. Do not become pregnant while taking this medicine or for 7 months after stopping it. Women should inform their doctor if they wish to become pregnant or think they might be pregnant. Women of child-bearing potential will need to have a negative pregnancy test before starting this medicine. There is a potential for serious side effects to an unborn child. Talk to your health care professional or pharmacist for more information. Do not breast-feed an infant while taking this medicine or for 7 months after stopping it.  Women must use effective birth control with this medicine. What side effects may I notice from receiving this medicine? Side effects that you should report to your doctor or other health care professional as soon as possible: -breathing difficulties -chest pain or palpitations -cough -dizziness or fainting -fever or chills, sore throat -skin rash, itching or hives -swelling of the legs or ankles -unusually weak or tired Side effects that usually do not require medical attention (report to your doctor or other health care professional if they continue or are bothersome): -loss of appetite -headache -muscle aches -nausea This list may not describe all possible side effects. Call your doctor for medical advice about side  effects. You may report side effects to FDA at 1-800-FDA-1088. Where should I keep my medicine? This drug is given in a hospital or clinic and will not be stored at home. NOTE: This sheet is a summary. It may not cover all possible information. If you have questions about this medicine, talk to your doctor, pharmacist, or health care provider.    2016, [employer]. (2015-01-15 11:49:32) Zoledronic Acid injection (Hypercalcemia, Oncology) What is this medicine? ZOLEDRONIC ACID (ZOE le dron ik AS id) lowers the amount of calcium loss from bone. It is used to treat too much calcium in your blood from cancer. It is also used to prevent complications of cancer that has spread to the bone. This medicine may be used for other purposes; ask your health care provider or pharmacist if you have questions. What should I tell my health care provider before I take this medicine? They need to know if you have any of these conditions: -aspirin-sensitive asthma -cancer, especially if you are receiving medicines used to treat cancer -dental disease or wear dentures -infection -kidney disease -receiving corticosteroids like dexamethasone or prednisone -an unusual or allergic reaction to zoledronic acid, other medicines, foods, dyes, or preservatives -pregnant or trying to get pregnant -breast-feeding How should I use this medicine? This medicine is for infusion into a vein. It is given by a health care professional in a hospital or clinic setting. Talk to your pediatrician regarding the use of this medicine in children. Special care may be needed. Overdosage: If you think you have taken too much of this medicine contact a poison control center or emergency room at once. NOTE: This medicine is only for you. Do not share this medicine with others. What if I miss a dose? It is important not to miss your dose. Call your doctor or health care professional if you are unable to keep an  appointment. What may interact with this medicine? -certain antibiotics given by injection -NSAIDs, medicines for pain and inflammation, like ibuprofen or naproxen -some diuretics like bumetanide, furosemide -teriparatide -thalidomide This list may not describe all possible interactions. Give your health care provider a list of all the medicines, herbs, non-prescription drugs, or dietary supplements you use. Also tell them if you smoke, drink alcohol, or use illegal drugs. Some items may interact with your medicine. What should I watch for while using this medicine? Visit your doctor or health care professional for regular checkups. It may be some time before you see the benefit from this medicine. Do not stop taking your medicine unless your doctor tells you to. Your doctor may order blood tests or other tests to see how you are doing. Women should inform their doctor if they wish to become pregnant or think they might be pregnant. There is a potential for serious side effects to an  unborn child. Talk to your health care professional or pharmacist for more information. You should make sure that you get enough calcium and vitamin D while you are taking this medicine. Discuss the foods you eat and the vitamins you take with your health care professional. Some people who take this medicine have severe bone, joint, and/or muscle pain. This medicine may also increase your risk for jaw problems or a broken thigh bone. Tell your doctor right away if you have severe pain in your jaw, bones, joints, or muscles. Tell your doctor if you have any pain that does not go away or that gets worse. Tell your dentist and dental surgeon that you are taking this medicine. You should not have major dental surgery while on this medicine. See your dentist to have a dental exam and fix any dental problems before starting this medicine. Take good care of your teeth while on this medicine. Make sure you see your dentist for  regular follow-up appointments. What side effects may I notice from receiving this medicine? Side effects that you should report to your doctor or health care professional as soon as possible: -allergic reactions like skin rash, itching or hives, swelling of the face, lips, or tongue -anxiety, confusion, or depression -breathing problems -changes in vision -eye pain -feeling faint or lightheaded, falls -jaw pain, especially after dental work -mouth sores -muscle cramps, stiffness, or weakness -redness, blistering, peeling or loosening of the skin, including inside the mouth -trouble passing urine or change in the amount of urine Side effects that usually do not require medical attention (report to your doctor or health care professional if they continue or are bothersome): -bone, joint, or muscle pain -constipation -diarrhea -fever -hair loss -irritation at site where injected -loss of appetite -nausea, vomiting -stomach upset -trouble sleeping -trouble swallowing -weak or tired This list may not describe all possible side effects. Call your doctor for medical advice about side effects. You may report side effects to FDA at 1-800-FDA-1088. Where should I keep my medicine? This drug is given in a hospital or clinic and will not be stored at home. NOTE: This sheet is a summary. It may not cover all possible information. If you have questions about this medicine, talk to your doctor, pharmacist, or health care provider.    2016, [employer]. (2014-03-07 14:19:39)

## 2016-06-01 NOTE — Progress Notes (Signed)
Hematology and Oncology Follow Up Visit  Bianca Kent [id number] 1966-10-17 50 y.o. 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 is back for followup . She now is on Faslodex. She is really having issues with the Faslodex now. Apparently, she has a lot of bony pain with it. I'm somewhat surprised by this. She does take quite a few medications and it sounds like there may be some interactions that she is having.  She is really having I tough time deciding whether or not she wants to continue therapy.  According to her sister, she just is not doing well. She stays in bed most of the time. Since we last saw her, she said that she had only 2 good days.   She is not eating much.  I think pain control seems to be fairly adequate.   She is having a lot of nausea. I am not sure as to why she's been having the nausea. She has been tried on multiple different medicines for nausea.  She is taking quite a few medicines. I just wonder if some how there may not be some interactions that she could be having.  As far as her breast cancer is concerned, from my point of view, everything really is holding pretty steady.  Her last CA-27-29 was down to 19.  She had an echocardiogram done about 2 weeks ago. This looked quite good. Her ejection fraction was 60-65%.  She still is having some issues with her bowels or bladder. She's had no recent urinary tract infections.  She has had no cough. She stores oxygen. 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  Overall, her performance status is ECOG 2.  Medications:  Current Outpatient Prescriptions:  .  albuterol (PROVENTIL HFA;VENTOLIN HFA) 108 (90  BASE) MCG/ACT inhaler, Inhale 2 puffs into the lungs every 4 (four) hours as needed for wheezing or shortness of breath., Disp: 2 Inhaler, Rfl: 6 .  ALPRAZolam (XANAX) 1 MG tablet, Take 1 tablet (1 mg total) by mouth 2 (two) times daily as needed for anxiety., Disp: 60 tablet, Rfl: 2 .  aspirin (ASPIRIN EC) 81 MG EC tablet, Take 81 mg by mouth daily. Swallow whole., Disp: , Rfl:  .  Bismuth Subsalicylate (PEPTO-BISMOL PO), Take 1 tablet by mouth as needed (upset stomach). , Disp: , Rfl:  .  calcium carbonate (TUMS - DOSED IN MG ELEMENTAL CALCIUM) 500 MG chewable tablet, Chew 2 tablets by mouth 2 (two) times daily., Disp: , Rfl:  .  diphenhydrAMINE (SOMINEX) 25 MG tablet, Take 100 mg by mouth at bedtime as needed for sleep. Pt states she is taking up to 200 mg Benadryl at night and still not sleeping, Disp: , Rfl:  .  dronabinol (MARINOL) 10 MG capsule, Take 1 capsule (10 mg total) by mouth 3 (three) times daily before meals., Disp: 90 capsule, Rfl: 0 .  fentaNYL (DURAGESIC - DOSED MCG/HR) 100 MCG/HR, Place 1 patch (100 mcg total) onto the skin every other day., Disp: 15 patch, Rfl: 0 .  fentaNYL (DURAGESIC - DOSED MCG/HR) 50 MCG/HR, Place 1 patch (50 mcg total) onto the skin every 3 (three) days., Disp:  5 patch, Rfl: 0 .  Lactulose SOLN, Take 2 TBLSP every 6 hours until bowel movement (Patient taking differently: Take 30 mLs by mouth every 6 (six) hours as needed. Take 2 TBLSP every 6 hours until bowel movement), Disp: 1000 mL, Rfl: 4 .  lidocaine (LIDODERM) 5 %, Place 1 patch onto the skin daily. Remove & Discard patch within 12 hours or as directed by MD, Disp: 20 patch, Rfl: 0 .  lidocaine (XYLOCAINE) 2 % solution, Use as directed 20 mLs in the mouth or throat as needed for mouth pain., Disp: 200 mL, Rfl: 2 .  lidocaine-prilocaine (EMLA) cream, Apply 1 application topically as needed., Disp: 30 g, Rfl: 6 .  loratadine-pseudoephedrine (CLARITIN-D 24-HOUR) 10-240 MG per 24 hr tablet, Take 1 tablet by  mouth daily., Disp: , Rfl:  .  magic mouthwash w/lidocaine SOLN, Take 5 mLs by mouth 4 (four) times daily as needed for mouth pain., Disp: 600 mL, Rfl: 4 .  methylphenidate (RITALIN) 10 MG tablet, Take 1 tablet (10 mg total) by mouth 2 (two) times daily., Disp: 60 tablet, Rfl: 0 .  Multiple Vitamin (MULTIVITAMIN) tablet, Take 1 tablet by mouth daily., Disp: , Rfl:  .  naloxegol oxalate (MOVANTIK) 25 MG TABS tablet, Take 1 tablet (25 mg total) by mouth daily., Disp: 30 tablet, Rfl: 6 .  NONFORMULARY OR COMPOUNDED ITEM, Take 5 mLs by mouth as needed (irritation). DUKES MOUTHWASH  SWISH AND SWALLOW, Disp: , Rfl:  .  Nystatin POWD, 1 application by Does not apply route 2 (two) times daily., Disp: 1 Bottle, Rfl: 3 .  ondansetron (ZOFRAN) 8 MG tablet, Take 1 tablet (8 mg total) by mouth every 8 (eight) hours as needed for nausea or vomiting., Disp: 60 tablet, Rfl: 2 .  oxybutynin (DITROPAN) 5 MG tablet, Take 1 tablet (5 mg total) by mouth 2 (two) times daily., Disp: 60 tablet, Rfl: 0 .  pantoprazole (PROTONIX) 40 MG tablet, Take 1 tablet (40 mg total) by mouth daily., Disp: 30 tablet, Rfl: 6 .  pilocarpine (SALAGEN) 5 MG tablet, Take 1 tablet (5 mg total) by mouth 2 (two) times daily., Disp: 60 tablet, Rfl: 3 .  promethazine (PHENERGAN) 25 MG suppository, Place 1 suppository (25 mg total) rectally every 6 (six) hours as needed for nausea or vomiting., Disp: 60 each, Rfl: 3 .  Sennosides-Docusate Sodium (SENNA-S PO), Take 1 tablet by mouth as needed., Disp: , Rfl:  .  sucralfate (CARAFATE) 1 g tablet, Take 1 g by mouth daily., Disp: , Rfl: 1 .  ergocalciferol (VITAMIN D2) 50000 units capsule, Take 1 capsule (50,000 Units total) by mouth 2 (two) times a week., Disp: 8 capsule, Rfl: 6 .  HYDROmorphone (DILAUDID) 4 MG tablet, Take 1 tablet (4 mg total) by mouth every 4 (four) hours as needed for severe pain., Disp: 90 tablet, Rfl: 0 .  traZODone (DESYREL) 100 MG tablet, TAKE 1 TO 2 TABLETS BY MOUTH AS NEEDED  FOR SLEEP, Disp: 60 tablet, Rfl: 2 No current facility-administered medications for this visit.   Facility-Administered Medications Ordered in Other Visits:  .  fulvestrant (FASLODEX) injection 500 mg, 500 mg, Intramuscular, Once, [name], MD .  HYDROmorphone (DILAUDID) injection 2 mg, 2 mg, Intravenous, Q4H PRN, [name], MD, 2 mg at 04/20/16 1233 .  Zoledronic Acid (ZOMETA) 4 mg IVPB, 4 mg, Intravenous, Once, [name], MD  Allergies:  Allergies  Allergen Reactions  . Iohexol      Code: RASH, Desc: VERY  STRONG FAMILY HX OF ANGIOEDEMA WHEN RECEIVING IV CONTRAST; PT HAS BEEN PREMEDICATED FOR OTHER CONTRASTED STUDIES(IN CATH. LAB)  KR, Onset Date: [id number]   . Prednisone Itching    Capillary beds bust  . Tetanus Toxoids Other (See Comments)    Ran a high fever for 48 hours  . Theophyllines Hives    Mental changes  . Versed [Midazolam] Other (See Comments)    Pt becomes violent    Past Medical History, Surgical history, Social history, and Family History were reviewed and updated.  Review of Systems: As above  Physical Exam:  height is 5\' 7"  (1.702 m) and weight is 163 lb 0.6 oz (74 kg). Her oral temperature is 97.4 F (36.3 C). Her blood pressure is 111/72 and her pulse is 75. Her respiration is 18 and oxygen saturation is 98%.   Well-developed and well-nourished white female. Head and exam shows no ocular or oral lesions. She's able to move both eyes well. She has good extraocular muscle . There is no erythema or exudate from the left eye. There might be some slight fullness in the right axilla by do not detect any obvious adenopathy. I cannot detect any obvious right supraclavicular lymph nodes. She has improved range of motion of the right shoulder. Left supraclavicular region is okay. No masses are noted. Lungs are clear. Cardiac exam regular rate and rhythm with no murmurs, rubs or bruits.. Abdomen is soft. She has good bowel sounds. There is no palpable  liver or spleen tip. Back exam shows decreased tenderness over the lumbar and thoracic spine to palpation. Extremities shows no clubbing, cyanosis or edema. She has 4/5 strength in her legs. Skin exam shows improvement in the radiation dermatitis of the right breast and axilla. There is no open wound. There is some hyper pigmentation. Neurological exam is nonfocal.    Lab Results  Component Value Date   WBC 4.4 06/01/2016   HGB 12.5 06/01/2016   HCT 37.0 06/01/2016   MCV 85 06/01/2016   PLT 136 (L) 06/01/2016     Chemistry      Component Value Date/Time   NA 135 06/01/2016 0913   NA 138 04/30/2015 1019   K 3.8 06/01/2016 0913   K 3.5 04/30/2015 1019   CL 99 06/01/2016 0913   CO2 31 06/01/2016 0913   CO2 29 04/30/2015 1019   BUN 6 (L) 06/01/2016 0913   BUN 9.7 04/30/2015 1019   CREATININE 0.9 06/01/2016 0913   CREATININE 0.8 04/30/2015 1019      Component Value Date/Time   CALCIUM 8.8 06/01/2016 0913   CALCIUM 9.7 04/30/2015 1019   ALKPHOS 244 (H) 06/01/2016 0913   ALKPHOS 257 (H) 04/30/2015 1019   AST 31 06/01/2016 0913   AST 57 (H) 04/30/2015 1019   ALT 53 (H) 06/01/2016 0913   ALT 41 04/30/2015 1019   BILITOT 0.70 06/01/2016 0913   BILITOT 0.60 04/30/2015 1019         Impression and Plan: Bianca Kent is 50 year old white female with triple positive metastatic breast cancer.  I am Just disappointed that she is not feeling better. We are doing all that we can do. She is doing all that she can do. The treatments, I think are working. If not, I would think that her CA-27-29 would be a lot higher.   Again, this is all about quality of life. I want to make sure that she is able to function well. The rehabilitation to get her off the Faslodex  we can do this.  She does has all these issues going on right now. Again, maybe try and do come back on her medications might make a difference.   This is just very emotional for her right now. She wants to think about what she  wants to do.  We will plan to get her back to see [place] in another 3 weeks. We certainly can get her back sooner if necessary.   We spent about 40 minutes with her today. As always, her faith remains strong. She has such good support from her family.     [name], MD 8/10/20171:26 PM

## 2016-06-02 ENCOUNTER — Encounter: Payer: Self-pay | Admitting: Nurse Practitioner

## 2016-06-02 LAB — CANCER ANTIGEN 27.29: CAN 27.29: 16.9 U/mL (ref 0.0–38.6)

## 2016-06-07 ENCOUNTER — Other Ambulatory Visit: Payer: Self-pay | Admitting: Radiation Therapy

## 2016-06-07 DIAGNOSIS — C7931 Secondary malignant neoplasm of brain: Secondary | ICD-10-CM

## 2016-06-07 DIAGNOSIS — C7949 Secondary malignant neoplasm of other parts of nervous system: Principal | ICD-10-CM

## 2016-06-08 ENCOUNTER — Other Ambulatory Visit: Payer: Self-pay | Admitting: *Deleted

## 2016-06-08 ENCOUNTER — Ambulatory Visit: Payer: 59 | Admitting: Hematology & Oncology

## 2016-06-08 ENCOUNTER — Other Ambulatory Visit: Payer: 59

## 2016-06-08 ENCOUNTER — Ambulatory Visit: Payer: 59

## 2016-06-08 DIAGNOSIS — G4701 Insomnia due to medical condition: Secondary | ICD-10-CM

## 2016-06-08 DIAGNOSIS — R64 Cachexia: Secondary | ICD-10-CM

## 2016-06-08 DIAGNOSIS — C50911 Malignant neoplasm of unspecified site of right female breast: Secondary | ICD-10-CM

## 2016-06-08 DIAGNOSIS — R112 Nausea with vomiting, unspecified: Secondary | ICD-10-CM

## 2016-06-08 DIAGNOSIS — C50912 Malignant neoplasm of unspecified site of left female breast: Secondary | ICD-10-CM

## 2016-06-08 DIAGNOSIS — R5383 Other fatigue: Secondary | ICD-10-CM

## 2016-06-08 MED ORDER — FENTANYL 50 MCG/HR TD PT72: 50.0000 ug | MEDICATED_PATCH | TRANSDERMAL | 0 refills | Status: DC

## 2016-06-08 MED ORDER — FENTANYL 100 MCG/HR TD PT72: 100.0000 ug | MEDICATED_PATCH | TRANSDERMAL | 0 refills | Status: DC

## 2016-06-08 MED FILL — fentaNYL 100 MCG/HR PT72: 100 | 30 days supply | Qty: 15 | Fill #0

## 2016-06-21 DIAGNOSIS — J9601 Acute respiratory failure with hypoxia: Secondary | ICD-10-CM | POA: Diagnosis not present

## 2016-06-22 ENCOUNTER — Ambulatory Visit: Payer: 59 | Admitting: Hematology & Oncology

## 2016-06-22 ENCOUNTER — Ambulatory Visit: Payer: 59

## 2016-06-22 ENCOUNTER — Other Ambulatory Visit: Payer: 59

## 2016-06-29 ENCOUNTER — Ambulatory Visit (HOSPITAL_BASED_OUTPATIENT_CLINIC_OR_DEPARTMENT_OTHER): Payer: 59

## 2016-06-29 ENCOUNTER — Ambulatory Visit (HOSPITAL_BASED_OUTPATIENT_CLINIC_OR_DEPARTMENT_OTHER): Payer: 59 | Admitting: Hematology & Oncology

## 2016-06-29 ENCOUNTER — Other Ambulatory Visit (HOSPITAL_BASED_OUTPATIENT_CLINIC_OR_DEPARTMENT_OTHER): Payer: 59

## 2016-06-29 ENCOUNTER — Encounter: Payer: Self-pay | Admitting: Hematology & Oncology

## 2016-06-29 ENCOUNTER — Inpatient Hospital Stay: Payer: 59

## 2016-06-29 ENCOUNTER — Other Ambulatory Visit: Payer: Self-pay

## 2016-06-29 VITALS — BP 113/66 | HR 64 | Temp 98.1°F | Resp 20 | Ht 67.0 in | Wt 159.0 lb

## 2016-06-29 DIAGNOSIS — C50912 Malignant neoplasm of unspecified site of left female breast: Secondary | ICD-10-CM

## 2016-06-29 DIAGNOSIS — C7931 Secondary malignant neoplasm of brain: Secondary | ICD-10-CM | POA: Diagnosis not present

## 2016-06-29 DIAGNOSIS — C50919 Malignant neoplasm of unspecified site of unspecified female breast: Secondary | ICD-10-CM

## 2016-06-29 DIAGNOSIS — G4701 Insomnia due to medical condition: Secondary | ICD-10-CM

## 2016-06-29 DIAGNOSIS — Z9221 Personal history of antineoplastic chemotherapy: Secondary | ICD-10-CM

## 2016-06-29 DIAGNOSIS — R112 Nausea with vomiting, unspecified: Secondary | ICD-10-CM

## 2016-06-29 DIAGNOSIS — C50911 Malignant neoplasm of unspecified site of right female breast: Secondary | ICD-10-CM

## 2016-06-29 DIAGNOSIS — R5383 Other fatigue: Secondary | ICD-10-CM

## 2016-06-29 DIAGNOSIS — R634 Abnormal weight loss: Secondary | ICD-10-CM | POA: Diagnosis not present

## 2016-06-29 DIAGNOSIS — Z23 Encounter for immunization: Secondary | ICD-10-CM

## 2016-06-29 DIAGNOSIS — D508 Other iron deficiency anemias: Secondary | ICD-10-CM

## 2016-06-29 DIAGNOSIS — Z95828 Presence of other vascular implants and grafts: Secondary | ICD-10-CM

## 2016-06-29 DIAGNOSIS — R350 Frequency of micturition: Secondary | ICD-10-CM

## 2016-06-29 DIAGNOSIS — D696 Thrombocytopenia, unspecified: Secondary | ICD-10-CM

## 2016-06-29 DIAGNOSIS — R64 Cachexia: Secondary | ICD-10-CM

## 2016-06-29 LAB — CBC WITH DIFFERENTIAL (CANCER CENTER ONLY)
BASO#: 0 10*3/uL (ref 0.0–0.2)
BASO%: 0.4 % (ref 0.0–2.0)
EOS%: 1.9 % (ref 0.0–7.0)
Eosinophils Absolute: 0.1 10*3/uL (ref 0.0–0.5)
HCT: 39.7 % (ref 34.8–46.6)
HGB: 13.5 g/dL (ref 11.6–15.9)
LYMPH#: 0.9 10*3/uL (ref 0.9–3.3)
LYMPH%: 16.2 % (ref 14.0–48.0)
MCH: 28.7 pg (ref 26.0–34.0)
MCHC: 34 g/dL (ref 32.0–36.0)
MCV: 85 fL (ref 81–101)
MONO#: 0.4 10*3/uL (ref 0.1–0.9)
MONO%: 7.2 % (ref 0.0–13.0)
NEUT#: 3.9 10*3/uL (ref 1.5–6.5)
NEUT%: 74.3 % (ref 39.6–80.0)
PLATELETS: 92 10*3/uL — AB (ref 145–400)
RBC: 4.7 10*6/uL (ref 3.70–5.32)
RDW: 15.2 % (ref 11.1–15.7)
WBC: 5.3 10*3/uL (ref 3.9–10.0)

## 2016-06-29 LAB — CMP (CANCER CENTER ONLY)
ALT(SGPT): 30 U/L (ref 10–47)
AST: 28 U/L (ref 11–38)
Albumin: 3.6 g/dL (ref 3.3–5.5)
Alkaline Phosphatase: 127 U/L — ABNORMAL HIGH (ref 26–84)
BUN: 5 mg/dL — AB (ref 7–22)
CHLORIDE: 103 meq/L (ref 98–108)
CO2: 32 mEq/L (ref 18–33)
CREATININE: 0.8 mg/dL (ref 0.6–1.2)
Calcium: 8.9 mg/dL (ref 8.0–10.3)
GLUCOSE: 104 mg/dL (ref 73–118)
Potassium: 3.8 mEq/L (ref 3.3–4.7)
SODIUM: 137 meq/L (ref 128–145)
TOTAL PROTEIN: 7.5 g/dL (ref 6.4–8.1)
Total Bilirubin: 0.7 mg/dl (ref 0.20–1.60)

## 2016-06-29 MED ORDER — ALPRAZOLAM 1 MG PO TABS: 1.0000 mg | ORAL_TABLET | Freq: Two times a day (BID) | ORAL | 2 refills | Status: DC | PRN

## 2016-06-29 MED ORDER — HEPARIN SOD (PORK) LOCK FLUSH 100 UNIT/ML IV SOLN
500.0000 [IU] | Freq: Once | INTRAVENOUS | Status: AC | PRN
  Administered 2016-06-29: 500 [IU] via INTRAVENOUS
  Filled 2016-06-29: qty 5

## 2016-06-29 MED ORDER — SODIUM CHLORIDE 0.9 % IV SOLN
Freq: Once | INTRAVENOUS | Status: AC
  Administered 2016-06-29: 13:00:00 via INTRAVENOUS

## 2016-06-29 MED ORDER — MAGIC MOUTHWASH W/LIDOCAINE: 5.0000 mL | Freq: Four times a day (QID) | ORAL | 4 refills | Status: DC | PRN

## 2016-06-29 MED ORDER — ONDANSETRON HCL 8 MG PO TABS: 8.0000 mg | ORAL_TABLET | Freq: Three times a day (TID) | ORAL | 2 refills | Status: AC | PRN

## 2016-06-29 MED ORDER — ZOLEDRONIC ACID 4 MG/100ML IV SOLN
4.0000 mg | Freq: Once | INTRAVENOUS | Status: AC
  Administered 2016-06-29: 4 mg via INTRAVENOUS
  Filled 2016-06-29: qty 100

## 2016-06-29 MED ORDER — SODIUM CHLORIDE 0.9 % IJ SOLN
10.0000 mL | INTRAMUSCULAR | Status: DC | PRN
  Administered 2016-06-29: 10 mL via INTRAVENOUS
  Filled 2016-06-29: qty 10

## 2016-06-29 MED ORDER — OXYBUTYNIN CHLORIDE 5 MG PO TABS: 5.0000 mg | ORAL_TABLET | Freq: Two times a day (BID) | ORAL | 0 refills | Status: DC

## 2016-06-29 MED ORDER — MIRTAZAPINE 30 MG PO TABS: 30.0000 mg | ORAL_TABLET | Freq: Every day | ORAL | 3 refills | Status: DC

## 2016-06-29 MED FILL — MAGIC MOUTHWASH W/LIDO 1:1: 24 days supply | Qty: 480 | Fill #0

## 2016-06-29 MED FILL — OXYBUTYNIN 5 MG TABLET: 5 | 30 days supply | Qty: 60 | Fill #0

## 2016-06-29 MED FILL — MIRTAZAPINE 30 MG TABLET: 30 | 30 days supply | Qty: 30 | Fill #0

## 2016-06-29 MED FILL — ONDANSETRON HCL 8 MG TABLET: 8 | 20 days supply | Qty: 60 | Fill #0

## 2016-06-29 MED FILL — ALPRAZolam 1 MG TABS: 1 | 30 days supply | Qty: 60 | Fill #0

## 2016-06-29 NOTE — Patient Instructions (Signed)

## 2016-06-30 LAB — CANCER ANTIGEN 27.29: CAN 27.29: 25.2 U/mL (ref 0.0–38.6)

## 2016-06-30 NOTE — Progress Notes (Signed)
Hematology and Oncology Follow Up Visit  Bianca Kent [id number] 1966/01/13 50 y.o. 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 is back for followup . She given all the issues that she has been having, I really think it is time for [place] to give her a "vacation" from treatment. She bases been on treatment now for over 2 years. Her body just his habits of time. She has responded incredibly well. Her CA 27.29 has come down to less than 20. Her last PET scan showed minimal disease.  She is on a ton of medicines. It would be nice to try to get her off some of these medicines. Other she may be having some issues with the medications causing some of her issues.   There is an element of I depression. Her sister feels very strongly that there is a lot of depression that she has.  I talked her about stopping treatment. She is quite nervous about stopping treatment. I encouraged her that she is not pregnant herself in any danger right now. She really has very little quality of life. I want her to be more active. I want her to be functional. I want her to be able to eat and enjoy it.  She has had mouth issues with treatment. Ultimately, now that treatment as stopped, she will be able to enjoy food without having her mouth burn.   She does not sleep all that well. I do not know if this might be from her medications.  She is on quite a few pain medications. Again, hopefully we will be able to cut back a little bit.  She has chronic pain issues.   She is on chronic oxygen. She has a paralyzed right hemidiaphragm. Her oxygen saturation levels have been adequate.  She actually was down at the beach recently. She  had a nice time.  Overall, her performance status is ECOG 2.  Medications:  Current Outpatient Prescriptions:  .  albuterol (PROVENTIL HFA;VENTOLIN HFA) 108 (90 BASE) MCG/ACT inhaler, Inhale 2 puffs into the lungs every 4 (four) hours as needed for wheezing or shortness of breath., Disp: 2 Inhaler, Rfl: 6 .  aspirin (ASPIRIN EC) 81 MG EC tablet, Take 81 mg by mouth daily. Swallow whole., Disp: , Rfl:  .  Bismuth Subsalicylate (PEPTO-BISMOL PO), Take 1 tablet by mouth as needed (upset stomach). , Disp: , Rfl:  .  calcium carbonate (TUMS - DOSED IN MG ELEMENTAL CALCIUM) 500 MG chewable tablet, Chew 2 tablets by mouth 2 (two) times daily., Disp: , Rfl:  .  diphenhydrAMINE (SOMINEX) 25 MG tablet, Take 100 mg by mouth at bedtime as needed for sleep. Pt states she is taking up to 200 mg Benadryl at night and still not sleeping, Disp: , Rfl:  .  dronabinol (MARINOL) 10 MG capsule, Take 1 capsule (10 mg total) by mouth 3 (three) times daily before meals., Disp: 90 capsule, Rfl: 0 .  ergocalciferol (VITAMIN D2) 50000 units capsule, Take 1 capsule (50,000 Units total) by mouth 2 (two) times a week., Disp: 8 capsule, Rfl: 6 .  fentaNYL (DURAGESIC - DOSED MCG/HR)  100 MCG/HR, Place 1 patch (100 mcg total) onto the skin every other day., Disp: 15 patch, Rfl: 0 .  fentaNYL (DURAGESIC - DOSED MCG/HR) 50 MCG/HR, Place 1 patch (50 mcg total) onto the skin every 3 (three) days., Disp: 10 patch, Rfl: 0 .  granisetron (SANCUSO) 3.1 MG/24HR, Apply to skin starting 24 hours before chemotherapy. Remove after 7 days., Disp: 4 each, Rfl: 3 .  HYDROmorphone (DILAUDID) 4 MG tablet, Take 1 tablet (4 mg total) by mouth every 4 (four) hours as needed for severe pain., Disp: 90 tablet, Rfl: 0 .  Lactulose SOLN, Take 2 TBLSP every 6 hours until bowel movement (Patient taking differently: Take 30 mLs by mouth every 6 (six) hours as needed. Take 2 TBLSP every 6 hours until bowel movement), Disp: 1000 mL, Rfl: 4 .  lidocaine  (LIDODERM) 5 %, Place 1 patch onto the skin daily. Remove & Discard patch within 12 hours or as directed by MD, Disp: 20 patch, Rfl: 0 .  lidocaine (XYLOCAINE) 2 % solution, Use as directed 20 mLs in the mouth or throat as needed for mouth pain., Disp: 200 mL, Rfl: 2 .  lidocaine-prilocaine (EMLA) cream, Apply 1 application topically as needed., Disp: 30 g, Rfl: 6 .  loratadine-pseudoephedrine (CLARITIN-D 24-HOUR) 10-240 MG per 24 hr tablet, Take 1 tablet by mouth daily., Disp: , Rfl:  .  methylphenidate (RITALIN) 10 MG tablet, Take 1 tablet (10 mg total) by mouth 2 (two) times daily., Disp: 60 tablet, Rfl: 0 .  Multiple Vitamin (MULTIVITAMIN) tablet, Take 1 tablet by mouth daily., Disp: , Rfl:  .  Nystatin POWD, 1 application by Does not apply route 2 (two) times daily., Disp: 1 Bottle, Rfl: 3 .  pantoprazole (PROTONIX) 40 MG tablet, Take 1 tablet (40 mg total) by mouth daily., Disp: 30 tablet, Rfl: 6 .  pilocarpine (SALAGEN) 5 MG tablet, Take 1 tablet (5 mg total) by mouth 2 (two) times daily., Disp: 60 tablet, Rfl: 3 .  promethazine (PHENERGAN) 25 MG suppository, Place 1 suppository (25 mg total) rectally every 6 (six) hours as needed for nausea or vomiting., Disp: 60 each, Rfl: 3 .  Sennosides-Docusate Sodium (SENNA-S PO), Take 1 tablet by mouth as needed., Disp: , Rfl:  .  sucralfate (CARAFATE) 1 g tablet, Take 1 g by mouth daily., Disp: , Rfl: 1 .  traZODone (DESYREL) 100 MG tablet, TAKE 1 TO 2 TABLETS BY MOUTH AS NEEDED FOR SLEEP, Disp: 60 tablet, Rfl: 2 .  ALPRAZolam (XANAX) 1 MG tablet, Take 1 tablet (1 mg total) by mouth 2 (two) times daily as needed for anxiety., Disp: 60 tablet, Rfl: 2 .  magic mouthwash w/lidocaine SOLN, Take 5 mLs by mouth 4 (four) times daily as needed for mouth pain., Disp: 600 mL, Rfl: 4 .  mirtazapine (REMERON) 30 MG tablet, Take 1 tablet (30 mg total) by mouth at bedtime., Disp: 30 tablet, Rfl: 3 .  naloxegol oxalate (MOVANTIK) 25 MG TABS tablet, Take 1 tablet (25  mg total) by mouth daily. (Patient not taking: Reported on 06/29/2016), Disp: 30 tablet, Rfl: 6 .  NONFORMULARY OR COMPOUNDED ITEM, Take 5 mLs by mouth as needed (irritation). DUKES MOUTHWASH  SWISH AND SWALLOW, Disp: , Rfl:  .  ondansetron (ZOFRAN) 8 MG tablet, Take 1 tablet (8 mg total) by mouth every 8 (eight) hours as needed for nausea or vomiting., Disp: 60 tablet, Rfl: 2 .  oxybutynin (DITROPAN) 5 MG tablet, Take 1 tablet (5 mg total) by mouth 2 (  two) times daily., Disp: 60 tablet, Rfl: 0 No current facility-administered medications for this visit.   Facility-Administered Medications Ordered in Other Visits:  .  HYDROmorphone (DILAUDID) injection 2 mg, 2 mg, Intravenous, Q4H PRN, [name], MD, 2 mg at 04/20/16 1233  Allergies:  Allergies  Allergen Reactions  . Iohexol      Code: RASH, Desc: VERY STRONG FAMILY HX OF ANGIOEDEMA WHEN RECEIVING IV CONTRAST; PT HAS BEEN PREMEDICATED FOR OTHER CONTRASTED STUDIES(IN CATH. LAB)  KR, Onset Date: [id number]   . Prednisone Itching    Capillary beds bust  . Tetanus Toxoids Other (See Comments)    Ran a high fever for 48 hours  . Theophyllines Hives    Mental changes  . Versed [Midazolam] Other (See Comments)    Pt becomes violent    Past Medical History, Surgical history, Social history, and Family History were reviewed and updated.  Review of Systems: As above  Physical Exam:  height is 5\' 7"  (1.702 m) and weight is 159 lb (72.1 kg). Her oral temperature is 98.1 F (36.7 C). Her blood pressure is 113/66 and her pulse is 64. Her respiration is 20 and oxygen saturation is 97%.   Well-developed and well-nourished white female. Head and exam shows no ocular or oral lesions. She's able to move both eyes well. She has good extraocular muscle . There is no erythema or exudate from the left eye. There might be some slight fullness in the right axilla by do not detect any obvious adenopathy. I cannot detect any obvious right supraclavicular  lymph nodes. She has improved range of motion of the right shoulder. Left supraclavicular region is okay. No masses are noted. Lungs are clear. Cardiac exam regular rate and rhythm with no murmurs, rubs or bruits.. Abdomen is soft. She has good bowel sounds. There is no palpable liver or spleen tip. Back exam shows decreased tenderness over the lumbar and thoracic spine to palpation. Extremities shows no clubbing, cyanosis or edema. She has 4/5 strength in her legs. Skin exam shows improvement in the radiation dermatitis of the right breast and axilla. There is no open wound. There is some hyper pigmentation. Neurological exam is nonfocal.    Lab Results  Component Value Date   WBC 5.3 06/29/2016   HGB 13.5 06/29/2016   HCT 39.7 06/29/2016   MCV 85 06/29/2016   PLT 92 (L) 06/29/2016     Chemistry      Component Value Date/Time   NA 137 06/29/2016 1052   NA 138 04/30/2015 1019   K 3.8 06/29/2016 1052   K 3.5 04/30/2015 1019   CL 103 06/29/2016 1052   CO2 32 06/29/2016 1052   CO2 29 04/30/2015 1019   BUN 5 (L) 06/29/2016 1052   BUN 9.7 04/30/2015 1019   CREATININE 0.8 06/29/2016 1052   CREATININE 0.8 04/30/2015 1019      Component Value Date/Time   CALCIUM 8.9 06/29/2016 1052   CALCIUM 9.7 04/30/2015 1019   ALKPHOS 127 (H) 06/29/2016 1052   ALKPHOS 257 (H) 04/30/2015 1019   AST 28 06/29/2016 1052   AST 57 (H) 04/30/2015 1019   ALT 30 06/29/2016 1052   ALT 41 04/30/2015 1019   BILITOT 0.70 06/29/2016 1052   BILITOT 0.60 04/30/2015 1019         Impression and Plan: Ms. Bahar is 50 year old white female with triple positive metastatic breast cancer.  Hopefully, she will be over to feel better and have a better quality of  life now that we are stopping her treatments.  I told her that we have to restart, recently have options that we can consider for her.  She wants to have quality of life above all other issues. She denies her quality of life. She just does not be able  to do all that much. She stays home most the time. She will does not go outside. Hopefully, now that we are stopping treatments, she will have more energy that she will be able to do more activities. I want her to be able to enjoy the  Fall whether that we will be having.   I told her that we will watch her closely. I still want to see her back every month. We have her on the bone Har[place] with Zometa.   As always, she her sister and I, had a very good prayer session. Ms. Marcengill has a very strong faith and I know this will help her out.   [name], MD 9/8/20171:20 PM

## 2016-07-13 ENCOUNTER — Other Ambulatory Visit: Payer: 59

## 2016-07-13 ENCOUNTER — Ambulatory Visit: Payer: 59

## 2016-07-13 ENCOUNTER — Ambulatory Visit: Payer: 59 | Admitting: Hematology & Oncology

## 2016-07-14 ENCOUNTER — Ambulatory Visit: Payer: 59 | Admitting: Hematology & Oncology

## 2016-07-14 ENCOUNTER — Ambulatory Visit: Payer: 59

## 2016-07-14 ENCOUNTER — Other Ambulatory Visit: Payer: 59

## 2016-07-20 ENCOUNTER — Other Ambulatory Visit (HOSPITAL_BASED_OUTPATIENT_CLINIC_OR_DEPARTMENT_OTHER): Payer: 59

## 2016-07-20 ENCOUNTER — Encounter: Payer: Self-pay | Admitting: Hematology & Oncology

## 2016-07-20 ENCOUNTER — Ambulatory Visit (HOSPITAL_BASED_OUTPATIENT_CLINIC_OR_DEPARTMENT_OTHER): Payer: 59 | Admitting: Hematology & Oncology

## 2016-07-20 ENCOUNTER — Ambulatory Visit (HOSPITAL_BASED_OUTPATIENT_CLINIC_OR_DEPARTMENT_OTHER): Payer: 59

## 2016-07-20 VITALS — BP 112/75 | HR 101 | Temp 97.7°F | Resp 18 | Ht 67.0 in | Wt 169.0 lb

## 2016-07-20 DIAGNOSIS — C50919 Malignant neoplasm of unspecified site of unspecified female breast: Secondary | ICD-10-CM | POA: Diagnosis not present

## 2016-07-20 DIAGNOSIS — Z23 Encounter for immunization: Secondary | ICD-10-CM

## 2016-07-20 DIAGNOSIS — D508 Other iron deficiency anemias: Secondary | ICD-10-CM

## 2016-07-20 DIAGNOSIS — R634 Abnormal weight loss: Secondary | ICD-10-CM | POA: Diagnosis not present

## 2016-07-20 DIAGNOSIS — C50911 Malignant neoplasm of unspecified site of right female breast: Secondary | ICD-10-CM

## 2016-07-20 DIAGNOSIS — D696 Thrombocytopenia, unspecified: Secondary | ICD-10-CM

## 2016-07-20 DIAGNOSIS — C50912 Malignant neoplasm of unspecified site of left female breast: Secondary | ICD-10-CM

## 2016-07-20 DIAGNOSIS — Z170 Estrogen receptor positive status [ER+]: Secondary | ICD-10-CM | POA: Diagnosis not present

## 2016-07-20 DIAGNOSIS — C7931 Secondary malignant neoplasm of brain: Secondary | ICD-10-CM | POA: Diagnosis not present

## 2016-07-20 DIAGNOSIS — K909 Intestinal malabsorption, unspecified: Secondary | ICD-10-CM

## 2016-07-20 DIAGNOSIS — Z95828 Presence of other vascular implants and grafts: Secondary | ICD-10-CM

## 2016-07-20 LAB — CBC WITH DIFFERENTIAL (CANCER CENTER ONLY)
BASO#: 0 10*3/uL (ref 0.0–0.2)
BASO%: 0.6 % (ref 0.0–2.0)
EOS ABS: 0.1 10*3/uL (ref 0.0–0.5)
EOS%: 2.5 % (ref 0.0–7.0)
HCT: 40.8 % (ref 34.8–46.6)
HGB: 13.7 g/dL (ref 11.6–15.9)
LYMPH#: 1.1 10*3/uL (ref 0.9–3.3)
LYMPH%: 23.4 % (ref 14.0–48.0)
MCH: 28.8 pg (ref 26.0–34.0)
MCHC: 33.6 g/dL (ref 32.0–36.0)
MCV: 86 fL (ref 81–101)
MONO#: 0.5 10*3/uL (ref 0.1–0.9)
MONO%: 9.5 % (ref 0.0–13.0)
NEUT#: 3.1 10*3/uL (ref 1.5–6.5)
NEUT%: 64 % (ref 39.6–80.0)
PLATELETS: 104 10*3/uL — AB (ref 145–400)
RBC: 4.75 10*6/uL (ref 3.70–5.32)
RDW: 15.7 % (ref 11.1–15.7)
WBC: 4.8 10*3/uL (ref 3.9–10.0)

## 2016-07-20 LAB — IRON AND TIBC
%SAT: 44 % (ref 21–57)
IRON: 104 ug/dL (ref 41–142)
TIBC: 239 ug/dL (ref 236–444)
UIBC: 134 ug/dL (ref 120–384)

## 2016-07-20 LAB — FERRITIN: FERRITIN: 212 ng/mL (ref 9–269)

## 2016-07-20 LAB — CMP (CANCER CENTER ONLY)
ALBUMIN: 3.6 g/dL (ref 3.3–5.5)
ALT(SGPT): 56 U/L — ABNORMAL HIGH (ref 10–47)
AST: 54 U/L — AB (ref 11–38)
Alkaline Phosphatase: 180 U/L — ABNORMAL HIGH (ref 26–84)
BILIRUBIN TOTAL: 0.8 mg/dL (ref 0.20–1.60)
BUN, Bld: 10 mg/dL (ref 7–22)
CALCIUM: 9.3 mg/dL (ref 8.0–10.3)
CO2: 30 mEq/L (ref 18–33)
CREATININE: 0.8 mg/dL (ref 0.6–1.2)
Chloride: 99 mEq/L (ref 98–108)
Glucose, Bld: 102 mg/dL (ref 73–118)
Potassium: 4.1 mEq/L (ref 3.3–4.7)
SODIUM: 138 meq/L (ref 128–145)
TOTAL PROTEIN: 7.8 g/dL (ref 6.4–8.1)

## 2016-07-20 LAB — TSH: TSH: 1.851 m(IU)/L (ref 0.308–3.960)

## 2016-07-20 MED ORDER — SODIUM CHLORIDE 0.9 % IJ SOLN
10.0000 mL | INTRAMUSCULAR | Status: DC | PRN
  Administered 2016-07-20: 10 mL via INTRAVENOUS
  Filled 2016-07-20: qty 10

## 2016-07-20 MED ORDER — HEPARIN SOD (PORK) LOCK FLUSH 100 UNIT/ML IV SOLN
500.0000 [IU] | Freq: Once | INTRAVENOUS | Status: AC | PRN
  Administered 2016-07-20: 500 [IU] via INTRAVENOUS
  Filled 2016-07-20: qty 5

## 2016-07-20 MED ORDER — ZOLEDRONIC ACID 4 MG/100ML IV SOLN
4.0000 mg | Freq: Once | INTRAVENOUS | Status: AC
  Administered 2016-07-20: 4 mg via INTRAVENOUS
  Filled 2016-07-20: qty 100

## 2016-07-20 MED ORDER — INFLUENZA VAC SPLIT QUAD 0.5 ML IM SUSY
0.5000 mL | PREFILLED_SYRINGE | Freq: Once | INTRAMUSCULAR | Status: AC
  Administered 2016-07-20: 0.5 mL via INTRAMUSCULAR
  Filled 2016-07-20: qty 0.5

## 2016-07-20 MED ORDER — SODIUM CHLORIDE 0.9 % IV SOLN
INTRAVENOUS | Status: DC
  Administered 2016-07-20: 09:00:00 via INTRAVENOUS

## 2016-07-20 MED ORDER — TOREMIFENE CITRATE 60 MG PO TABS: 60.0000 mg | ORAL_TABLET | Freq: Every day | ORAL | 6 refills | Status: DC

## 2016-07-20 MED FILL — fentaNYL 50 MCG/HR PT72: 50 | 30 days supply | Qty: 10 | Fill #0

## 2016-07-20 MED FILL — *FARESTON 60 MG TABLET: 60 | 30 days supply | Qty: 30 | Fill #0

## 2016-07-20 NOTE — Patient Instructions (Addendum)
Influenza Virus Vaccine injection What is this medicine? INFLUENZA VIRUS VACCINE (in floo EN zuh VAHY ruhs vak SEEN) helps to reduce the risk of getting influenza also known as the flu. The vaccine only helps protect you against some strains of the flu. This medicine may be used for other purposes; ask your health care provider or pharmacist if you have questions. What should I tell my health care provider before I take this medicine? They need to know if you have any of these conditions: -bleeding disorder like hemophilia -fever or infection -Guillain-Barre syndrome or other neurological problems -immune system problems -infection with the human immunodeficiency virus (HIV) or AIDS -low blood platelet counts -multiple sclerosis -an unusual or allergic reaction to influenza virus vaccine, latex, other medicines, foods, dyes, or preservatives. Different brands of vaccines contain different allergens. Some may contain latex or eggs. Talk to your doctor about your allergies to make sure that you get the right vaccine. -pregnant or trying to get pregnant -breast-feeding How should I use this medicine? This vaccine is for injection into a muscle or under the skin. It is given by a health care professional. A copy of Vaccine Information Statements will be given before each vaccination. Read this sheet carefully each time. The sheet may change frequently. Talk to your healthcare provider to see which vaccines are right for you. Some vaccines should not be used in all age groups. Overdosage: If you think you have taken too much of this medicine contact a poison control center or emergency room at once. NOTE: This medicine is only for you. Do not share this medicine with others. What if I miss a dose? This does not apply. What may interact with this medicine? -chemotherapy or radiation therapy -medicines that lower your immune system like etanercept, anakinra, infliximab, and adalimumab -medicines  that treat or prevent blood clots like warfarin -phenytoin -steroid medicines like prednisone or cortisone -theophylline -vaccines This list may not describe all possible interactions. Give your health care provider a list of all the medicines, herbs, non-prescription drugs, or dietary supplements you use. Also tell them if you smoke, drink alcohol, or use illegal drugs. Some items may interact with your medicine. What should I watch for while using this medicine? Report any side effects that do not go away within 3 days to your doctor or health care professional. Call your health care provider if any unusual symptoms occur within 6 weeks of receiving this vaccine. You may still catch the flu, but the illness is not usually as bad. You cannot get the flu from the vaccine. The vaccine will not protect against colds or other illnesses that may cause fever. The vaccine is needed every year. What side effects may I notice from receiving this medicine? Side effects that you should report to your doctor or health care professional as soon as possible: -allergic reactions like skin rash, itching or hives, swelling of the face, lips, or tongue Side effects that usually do not require medical attention (report to your doctor or health care professional if they continue or are bothersome): -fever -headache -muscle aches and pains -pain, tenderness, redness, or swelling at the injection site -tiredness This list may not describe all possible side effects. Call your doctor for medical advice about side effects. You may report side effects to FDA at 1-800-FDA-1088. Where should I keep my medicine? The vaccine will be given by a health care professional in a clinic, pharmacy, doctor's office, or other health care setting. You will not   be given vaccine doses to store at home. NOTE: This sheet is a summary. It may not cover all possible information. If you have questions about this medicine, talk to your  doctor, pharmacist, or health care provider.    2016, Elsevier/Gold Standard. (2015-04-30 10:07:28)   Zoledronic Acid injection (Hypercalcemia, Oncology) What is this medicine? ZOLEDRONIC ACID (ZOE le dron ik AS id) lowers the amount of calcium loss from bone. It is used to treat too much calcium in your blood from cancer. It is also used to prevent complications of cancer that has spread to the bone. This medicine may be used for other purposes; ask your health care provider or pharmacist if you have questions. What should I tell my health care provider before I take this medicine? They need to know if you have any of these conditions: -aspirin-sensitive asthma -cancer, especially if you are receiving medicines used to treat cancer -dental disease or wear dentures -infection -kidney disease -receiving corticosteroids like dexamethasone or prednisone -an unusual or allergic reaction to zoledronic acid, other medicines, foods, dyes, or preservatives -pregnant or trying to get pregnant -breast-feeding How should I use this medicine? This medicine is for infusion into a vein. It is given by a health care professional in a hospital or clinic setting. Talk to your pediatrician regarding the use of this medicine in children. Special care may be needed. Overdosage: If you think you have taken too much of this medicine contact a poison control center or emergency room at once. NOTE: This medicine is only for you. Do not share this medicine with others. What if I miss a dose? It is important not to miss your dose. Call your doctor or health care professional if you are unable to keep an appointment. What may interact with this medicine? -certain antibiotics given by injection -NSAIDs, medicines for pain and inflammation, like ibuprofen or naproxen -some diuretics like bumetanide, furosemide -teriparatide -thalidomide This list may not describe all possible interactions. Give your health care  provider a list of all the medicines, herbs, non-prescription drugs, or dietary supplements you use. Also tell them if you smoke, drink alcohol, or use illegal drugs. Some items may interact with your medicine. What should I watch for while using this medicine? Visit your doctor or health care professional for regular checkups. It may be some time before you see the benefit from this medicine. Do not stop taking your medicine unless your doctor tells you to. Your doctor may order blood tests or other tests to see how you are doing. Women should inform their doctor if they wish to become pregnant or think they might be pregnant. There is a potential for serious side effects to an unborn child. Talk to your health care professional or pharmacist for more information. You should make sure that you get enough calcium and vitamin D while you are taking this medicine. Discuss the foods you eat and the vitamins you take with your health care professional. Some people who take this medicine have severe bone, joint, and/or muscle pain. This medicine may also increase your risk for jaw problems or a broken thigh bone. Tell your doctor right away if you have severe pain in your jaw, bones, joints, or muscles. Tell your doctor if you have any pain that does not go away or that gets worse. Tell your dentist and dental surgeon that you are taking this medicine. You should not have major dental surgery while on this medicine. See your dentist to have a  dental exam and fix any dental problems before starting this medicine. Take good care of your teeth while on this medicine. Make sure you see your dentist for regular follow-up appointments. What side effects may I notice from receiving this medicine? Side effects that you should report to your doctor or health care professional as soon as possible: -allergic reactions like skin rash, itching or hives, swelling of the face, lips, or tongue -anxiety, confusion, or  depression -breathing problems -changes in vision -eye pain -feeling faint or lightheaded, falls -jaw pain, especially after dental work -mouth sores -muscle cramps, stiffness, or weakness -redness, blistering, peeling or loosening of the skin, including inside the mouth -trouble passing urine or change in the amount of urine Side effects that usually do not require medical attention (report to your doctor or health care professional if they continue or are bothersome): -bone, joint, or muscle pain -constipation -diarrhea -fever -hair loss -irritation at site where injected -loss of appetite -nausea, vomiting -stomach upset -trouble sleeping -trouble swallowing -weak or tired This list may not describe all possible side effects. Call your doctor for medical advice about side effects. You may report side effects to FDA at 1-800-FDA-1088. Where should I keep my medicine? This drug is given in a hospital or clinic and will not be stored at home. NOTE: This sheet is a summary. It may not cover all possible information. If you have questions about this medicine, talk to your doctor, pharmacist, or health care provider.    2016, Elsevier/Gold Standard. (2014-03-07 14:19:39)

## 2016-07-20 NOTE — Progress Notes (Signed)
Hematology and Oncology Follow Up Visit  Bianca Kent [id number] 08-Nov-1965 50 y.o. 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 is back for followup . She is now off treatment. She was having a hard time with the treatment. Her quality of life was not that good. I just hate to see her "exist". She was not doing anything. She is not going anywhere. She was not interacting with her family. I felt that she needed a break from treatment. Prevacid been off treatment now for about 3 weeks or so. She is doing better. She feels a little bit better. She's gained some weight. Her appetite has improved. She still is on a lot of pain medications. She is on multiple other medications. Immunized to try to adjust these bottle no if we really could at this point.   She has not had any bleeding or bruising. She's had no abdominal pain. Her constipation seems to be doing better. r mouth burn.   She does not sleep all that well. I do not know if this might be from her medications.  She is on quite a few pain medications. Again, hopefully we will be able to cut back a little bit.  She is on supplemental oxygen. 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  Her last CA-27-29 was holding pretty steady. It was 25.  She has had no headache. She does state that her eyes do flutter. She mostly has those that her pupils tend to flutter. She is due for an MRI in a month. We will move this up so that we can make sure that she has no recurrence of brain metastasis.  Overall, her performance status is ECOG 2.  Medications:  Current Outpatient  Prescriptions:  .  albuterol (PROVENTIL HFA;VENTOLIN HFA) 108 (90 BASE) MCG/ACT inhaler, Inhale 2 puffs into the lungs every 4 (four) hours as needed for wheezing or shortness of breath., Disp: 2 Inhaler, Rfl: 6 .  ALPRAZolam (XANAX) 1 MG tablet, Take 1 tablet (1 mg total) by mouth 2 (two) times daily as needed for anxiety., Disp: 60 tablet, Rfl: 2 .  aspirin (ASPIRIN EC) 81 MG EC tablet, Take 81 mg by mouth daily. Swallow whole., Disp: , Rfl:  .  diphenhydrAMINE (SOMINEX) 25 MG tablet, Take 100 mg by mouth at bedtime as needed for sleep. Pt states she is taking up to 200 mg Benadryl at night and still not sleeping, Disp: , Rfl:  .  dronabinol (MARINOL) 10 MG capsule, Take 1 capsule (10 mg total) by mouth 3 (three) times daily before meals., Disp: 90 capsule, Rfl: 0 .  ergocalciferol (VITAMIN D2) 50000 units capsule, Take 1 capsule (50,000 Units total) by mouth 2 (two) times a week., Disp: 8 capsule, Rfl: 6 .  fentaNYL (DURAGESIC - DOSED MCG/HR) 100 MCG/HR, Place 1 patch (100 mcg total) onto the skin every other day., Disp: 15 patch, Rfl: 0 .  fentaNYL (DURAGESIC - DOSED MCG/HR) 50 MCG/HR, Place 1 patch (50 mcg total) onto the skin every 3 (  three) days., Disp: 10 patch, Rfl: 0 .  HYDROmorphone (DILAUDID) 4 MG tablet, Take 1 tablet (4 mg total) by mouth every 4 (four) hours as needed for severe pain., Disp: 90 tablet, Rfl: 0 .  Lactulose SOLN, Take 2 TBLSP every 6 hours until bowel movement (Patient taking differently: Take 30 mLs by mouth every 6 (six) hours as needed. Take 2 TBLSP every 6 hours until bowel movement), Disp: 1000 mL, Rfl: 4 .  lidocaine-prilocaine (EMLA) cream, Apply 1 application topically as needed., Disp: 30 g, Rfl: 6 .  loratadine-pseudoephedrine (CLARITIN-D 24-HOUR) 10-240 MG per 24 hr tablet, Take 1 tablet by mouth daily., Disp: , Rfl:  .  methylphenidate (RITALIN) 10 MG tablet, Take 1 tablet (10 mg total) by mouth 2 (two) times daily., Disp: 60 tablet, Rfl: 0 .  mirtazapine  (REMERON) 30 MG tablet, Take 1 tablet (30 mg total) by mouth at bedtime., Disp: 30 tablet, Rfl: 3 .  Multiple Vitamin (MULTIVITAMIN) tablet, Take 1 tablet by mouth daily., Disp: , Rfl:  .  naloxegol oxalate (MOVANTIK) 25 MG TABS tablet, Take 1 tablet (25 mg total) by mouth daily., Disp: 30 tablet, Rfl: 6 .  NONFORMULARY OR COMPOUNDED ITEM, Take 5 mLs by mouth as needed (irritation). DUKES MOUTHWASH  SWISH AND SWALLOW, Disp: , Rfl:  .  Nystatin POWD, 1 application by Does not apply route 2 (two) times daily., Disp: 1 Bottle, Rfl: 3 .  ondansetron (ZOFRAN) 8 MG tablet, Take 1 tablet (8 mg total) by mouth every 8 (eight) hours as needed for nausea or vomiting., Disp: 60 tablet, Rfl: 2 .  oxybutynin (DITROPAN) 5 MG tablet, Take 1 tablet (5 mg total) by mouth 2 (two) times daily., Disp: 60 tablet, Rfl: 0 .  pantoprazole (PROTONIX) 40 MG tablet, Take 1 tablet (40 mg total) by mouth daily., Disp: 30 tablet, Rfl: 6 .  pilocarpine (SALAGEN) 5 MG tablet, Take 1 tablet (5 mg total) by mouth 2 (two) times daily., Disp: 60 tablet, Rfl: 3 .  promethazine (PHENERGAN) 25 MG suppository, Place 1 suppository (25 mg total) rectally every 6 (six) hours as needed for nausea or vomiting., Disp: 60 each, Rfl: 3 .  Sennosides-Docusate Sodium (SENNA-S PO), Take 1 tablet by mouth as needed., Disp: , Rfl:  .  sucralfate (CARAFATE) 1 g tablet, Take 1 g by mouth daily., Disp: , Rfl: 1 .  traZODone (DESYREL) 100 MG tablet, TAKE 1 TO 2 TABLETS BY MOUTH AS NEEDED FOR SLEEP, Disp: 60 tablet, Rfl: 2 .  Toremifene Citrate (FARESTON) 60 MG tablet, Take 1 tablet (60 mg total) by mouth daily., Disp: 30 tablet, Rfl: 6 No current facility-administered medications for this visit.   Facility-Administered Medications Ordered in Other Visits:  .  HYDROmorphone (DILAUDID) injection 2 mg, 2 mg, Intravenous, Q4H PRN, [name], MD, 2 mg at 04/20/16 1233  Allergies:  Allergies  Allergen Reactions  . Iohexol      Code: RASH, Desc: VERY  STRONG FAMILY HX OF ANGIOEDEMA WHEN RECEIVING IV CONTRAST; PT HAS BEEN PREMEDICATED FOR OTHER CONTRASTED STUDIES(IN CATH. LAB)  KR, Onset Date: [id number]   . Prednisone Itching    Capillary beds bust  . Tetanus Toxoids Other (See Comments)    Ran a high fever for 48 hours  . Theophyllines Hives    Mental changes  . Versed [Midazolam] Other (See Comments)    Pt becomes violent    Past Medical History, Surgical history, Social history, and Family History were reviewed and updated.  Review of Systems: As above  Physical Exam:  height is 5\' 7"  (1.702 m) and weight is 169 lb (76.7 kg). Her oral temperature is 97.7 F (36.5 C). Her blood pressure is 112/75 and her pulse is 101 (abnormal). Her respiration is 18.   Well-developed and well-nourished white female. Head and exam shows no ocular or oral lesions. She's able to move both eyes well. She has good extraocular muscle . There is no erythema or exudate from the left eye. There might be some slight fullness in the right axilla by do not detect any obvious adenopathy. I cannot detect any obvious right supraclavicular lymph nodes. She has improved range of motion of the right shoulder. Left supraclavicular region is okay. No masses are noted. Lungs are clear. Cardiac exam regular rate and rhythm with no murmurs, rubs or bruits.. Abdomen is soft. She has good bowel sounds. There is no palpable liver or spleen tip. Back exam shows decreased tenderness over the lumbar and thoracic spine to palpation. Extremities shows no clubbing, cyanosis or edema. She has 4/5 strength in her legs. Skin exam shows improvement in the radiation dermatitis of the right breast and axilla. There is no open wound. There is some hyper pigmentation. Neurological exam is nonfocal.    Lab Results  Component Value Date   WBC 4.8 07/20/2016   HGB 13.7 07/20/2016   HCT 40.8 07/20/2016   MCV 86 07/20/2016   PLT 104 (L) 07/20/2016     Chemistry      Component Value  Date/Time   NA 138 07/20/2016 0741   NA 138 04/30/2015 1019   K 4.1 07/20/2016 0741   K 3.5 04/30/2015 1019   CL 99 07/20/2016 0741   CO2 30 07/20/2016 0741   CO2 29 04/30/2015 1019   BUN 10 07/20/2016 0741   BUN 9.7 04/30/2015 1019   CREATININE 0.8 07/20/2016 0741   CREATININE 0.8 04/30/2015 1019      Component Value Date/Time   CALCIUM 9.3 07/20/2016 0741   CALCIUM 9.7 04/30/2015 1019   ALKPHOS 180 (H) 07/20/2016 0741   ALKPHOS 257 (H) 04/30/2015 1019   AST 54 (H) 07/20/2016 0741   AST 57 (H) 04/30/2015 1019   ALT 56 (H) 07/20/2016 0741   ALT 41 04/30/2015 1019   BILITOT 0.80 07/20/2016 0741   BILITOT 0.60 04/30/2015 1019         Impression and Plan: Bianca Kent is 50 year old white female with triple positive metastatic breast cancer.  I'm hoping that her quality of life will improve. I will start her on Fareston. I think this would be reasonable for her. It is antiestrogen. She still has an estrogen driven tumor area and I did this would be reasonable and well-tolerated. I talked to she and her sister about this. They are agreeable to this.   Hopefully, she will continue to improve. We'll see what her CA-69-29 is. I watched him to try to hold off on doing scans for right now if possible.   I just want to work on her quality of life. To me, this is what is most important. I want her to be able to enjoy things that she used to do area and   I would like to see her back in another 6 weeks now.   We will move her Zometa every 6 week dosing.   As always, we had a very good prayer session. She and her sister both enjoyed our prayer. It just gives them confidence.  [name], MD 9/28/20175:28 PM

## 2016-07-21 ENCOUNTER — Telehealth: Payer: Self-pay | Admitting: *Deleted

## 2016-07-21 LAB — CANCER ANTIGEN 27.29: CA 27.29: 23.1 U/mL (ref 0.0–38.6)

## 2016-07-21 NOTE — Telephone Encounter (Signed)
Patient's sister requesting that most recent office notes be sent to  [facility] ([phone]  They administer patient's disability and require further medical documentation of patient function.

## 2016-07-22 DIAGNOSIS — J9601 Acute respiratory failure with hypoxia: Secondary | ICD-10-CM | POA: Diagnosis not present

## 2016-08-01 MED FILL — traZODone HCL 100 MG TABS: 100 | 30 days supply | Qty: 60 | Fill #1

## 2016-08-01 MED FILL — MIRTAZAPINE 30 MG TABLET: 30 | 30 days supply | Qty: 30 | Fill #1

## 2016-08-07 ENCOUNTER — Ambulatory Visit (HOSPITAL_BASED_OUTPATIENT_CLINIC_OR_DEPARTMENT_OTHER): Payer: 59

## 2016-08-07 ENCOUNTER — Ambulatory Visit
Admission: RE | Admit: 2016-08-07 | Discharge: 2016-08-07 | Disposition: A | Discharge status: Home / Self Care | Payer: 59 | Source: Ambulatory Visit | Attending: Radiation Oncology | Admitting: Radiation Oncology

## 2016-08-07 VITALS — BP 122/72 | HR 93 | Temp 97.8°F | Resp 18

## 2016-08-07 DIAGNOSIS — C7951 Secondary malignant neoplasm of bone: Secondary | ICD-10-CM | POA: Diagnosis not present

## 2016-08-07 DIAGNOSIS — C7949 Secondary malignant neoplasm of other parts of nervous system: Principal | ICD-10-CM

## 2016-08-07 DIAGNOSIS — C7931 Secondary malignant neoplasm of brain: Secondary | ICD-10-CM

## 2016-08-07 DIAGNOSIS — C50912 Malignant neoplasm of unspecified site of left female breast: Secondary | ICD-10-CM

## 2016-08-07 DIAGNOSIS — C50911 Malignant neoplasm of unspecified site of right female breast: Secondary | ICD-10-CM | POA: Diagnosis not present

## 2016-08-07 DIAGNOSIS — C50919 Malignant neoplasm of unspecified site of unspecified female breast: Secondary | ICD-10-CM | POA: Diagnosis not present

## 2016-08-07 DIAGNOSIS — Z452 Encounter for adjustment and management of vascular access device: Secondary | ICD-10-CM

## 2016-08-07 MED ORDER — HEPARIN SOD (PORK) LOCK FLUSH 100 UNIT/ML IV SOLN
500.0000 [IU] | Freq: Once | INTRAVENOUS | Status: AC
  Administered 2016-08-07: 500 [IU] via INTRAVENOUS
  Filled 2016-08-07: qty 5

## 2016-08-07 MED ORDER — GADOBENATE DIMEGLUMINE 529 MG/ML IV SOLN
15.0000 mL | Freq: Once | INTRAVENOUS | Status: AC | PRN
  Administered 2016-08-07: 15 mL via INTRAVENOUS

## 2016-08-07 MED ORDER — SODIUM CHLORIDE 0.9% FLUSH
10.0000 mL | Freq: Once | INTRAVENOUS | Status: AC
  Administered 2016-08-07: 10 mL via INTRAVENOUS
  Filled 2016-08-07: qty 10

## 2016-08-07 NOTE — Progress Notes (Signed)
Bianca Kent presented for Portacath access and flush. Proper placement of portacath confirmed by CXR. Portacath located in the left chest wall accessed with  H 20 needle. Clean, Dry and Intact Good blood return present. Portacath flushed with 31ml NS and 500U/23ml Heparin per protocol and needle removed intact. Procedure without incident. Patient tolerated procedure well.

## 2016-08-07 NOTE — Patient Instructions (Signed)

## 2016-08-16 ENCOUNTER — Encounter: Payer: Self-pay | Admitting: Genetic Counselor

## 2016-08-16 DIAGNOSIS — Z1379 Encounter for other screening for genetic and chromosomal anomalies: Secondary | ICD-10-CM | POA: Insufficient documentation

## 2016-08-18 ENCOUNTER — Other Ambulatory Visit: Payer: Self-pay | Admitting: Family

## 2016-08-18 DIAGNOSIS — M810 Age-related osteoporosis without current pathological fracture: Secondary | ICD-10-CM | POA: Insufficient documentation

## 2016-08-21 DIAGNOSIS — J9601 Acute respiratory failure with hypoxia: Secondary | ICD-10-CM | POA: Diagnosis not present

## 2016-08-21 DIAGNOSIS — C7931 Secondary malignant neoplasm of brain: Secondary | ICD-10-CM | POA: Diagnosis not present

## 2016-08-21 DIAGNOSIS — Z6831 Body mass index (BMI) 31.0-31.9, adult: Secondary | ICD-10-CM | POA: Diagnosis not present

## 2016-08-28 MED FILL — FARESTON 60 MG TABLET: 60 | 30 days supply | Qty: 30 | Fill #1

## 2016-08-31 ENCOUNTER — Other Ambulatory Visit (HOSPITAL_BASED_OUTPATIENT_CLINIC_OR_DEPARTMENT_OTHER): Payer: 59

## 2016-08-31 ENCOUNTER — Ambulatory Visit (HOSPITAL_BASED_OUTPATIENT_CLINIC_OR_DEPARTMENT_OTHER): Payer: 59

## 2016-08-31 ENCOUNTER — Other Ambulatory Visit: Payer: 59

## 2016-08-31 ENCOUNTER — Ambulatory Visit (HOSPITAL_BASED_OUTPATIENT_CLINIC_OR_DEPARTMENT_OTHER): Payer: 59 | Admitting: Hematology & Oncology

## 2016-08-31 ENCOUNTER — Other Ambulatory Visit: Payer: Self-pay | Admitting: *Deleted

## 2016-08-31 ENCOUNTER — Encounter: Payer: Self-pay | Admitting: Hematology & Oncology

## 2016-08-31 VITALS — BP 118/80 | HR 83 | Temp 98.2°F | Resp 16 | Ht 67.0 in | Wt 186.0 lb

## 2016-08-31 DIAGNOSIS — D696 Thrombocytopenia, unspecified: Secondary | ICD-10-CM | POA: Diagnosis not present

## 2016-08-31 DIAGNOSIS — C50912 Malignant neoplasm of unspecified site of left female breast: Secondary | ICD-10-CM

## 2016-08-31 DIAGNOSIS — C7931 Secondary malignant neoplasm of brain: Secondary | ICD-10-CM

## 2016-08-31 DIAGNOSIS — R64 Cachexia: Secondary | ICD-10-CM

## 2016-08-31 DIAGNOSIS — Z23 Encounter for immunization: Secondary | ICD-10-CM | POA: Diagnosis not present

## 2016-08-31 DIAGNOSIS — Z9221 Personal history of antineoplastic chemotherapy: Secondary | ICD-10-CM | POA: Diagnosis not present

## 2016-08-31 DIAGNOSIS — K909 Intestinal malabsorption, unspecified: Secondary | ICD-10-CM

## 2016-08-31 DIAGNOSIS — C50911 Malignant neoplasm of unspecified site of right female breast: Secondary | ICD-10-CM

## 2016-08-31 DIAGNOSIS — R5383 Other fatigue: Secondary | ICD-10-CM

## 2016-08-31 DIAGNOSIS — M810 Age-related osteoporosis without current pathological fracture: Secondary | ICD-10-CM

## 2016-08-31 DIAGNOSIS — Z170 Estrogen receptor positive status [ER+]: Secondary | ICD-10-CM | POA: Diagnosis not present

## 2016-08-31 DIAGNOSIS — Z9981 Dependence on supplemental oxygen: Secondary | ICD-10-CM

## 2016-08-31 DIAGNOSIS — G4701 Insomnia due to medical condition: Secondary | ICD-10-CM

## 2016-08-31 LAB — IRON AND TIBC
%SAT: 25 % (ref 21–57)
IRON: 71 ug/dL (ref 41–142)
TIBC: 286 ug/dL (ref 236–444)
UIBC: 215 ug/dL (ref 120–384)

## 2016-08-31 LAB — CBC WITH DIFFERENTIAL (CANCER CENTER ONLY)
BASO#: 0 10*3/uL (ref 0.0–0.2)
BASO%: 0.4 % (ref 0.0–2.0)
EOS ABS: 0.1 10*3/uL (ref 0.0–0.5)
EOS%: 2 % (ref 0.0–7.0)
HEMATOCRIT: 38.6 % (ref 34.8–46.6)
HEMOGLOBIN: 12.7 g/dL (ref 11.6–15.9)
LYMPH#: 1.1 10*3/uL (ref 0.9–3.3)
LYMPH%: 24.5 % (ref 14.0–48.0)
MCH: 29.5 pg (ref 26.0–34.0)
MCHC: 32.9 g/dL (ref 32.0–36.0)
MCV: 90 fL (ref 81–101)
MONO#: 0.4 10*3/uL (ref 0.1–0.9)
MONO%: 7.9 % (ref 0.0–13.0)
NEUT%: 65.2 % (ref 39.6–80.0)
NEUTROS ABS: 3 10*3/uL (ref 1.5–6.5)
Platelets: 98 10*3/uL — ABNORMAL LOW (ref 145–400)
RBC: 4.31 10*6/uL (ref 3.70–5.32)
RDW: 15.3 % (ref 11.1–15.7)
WBC: 4.5 10*3/uL (ref 3.9–10.0)

## 2016-08-31 LAB — CMP (CANCER CENTER ONLY)
ALBUMIN: 3.4 g/dL (ref 3.3–5.5)
ALT(SGPT): 29 U/L (ref 10–47)
AST: 28 U/L (ref 11–38)
Alkaline Phosphatase: 148 U/L — ABNORMAL HIGH (ref 26–84)
BILIRUBIN TOTAL: 0.5 mg/dL (ref 0.20–1.60)
BUN: 12 mg/dL (ref 7–22)
CO2: 28 meq/L (ref 18–33)
CREATININE: 0.9 mg/dL (ref 0.6–1.2)
Calcium: 8.8 mg/dL (ref 8.0–10.3)
Chloride: 102 mEq/L (ref 98–108)
Glucose, Bld: 107 mg/dL (ref 73–118)
Potassium: 4.1 mEq/L (ref 3.3–4.7)
SODIUM: 140 meq/L (ref 128–145)
TOTAL PROTEIN: 7.1 g/dL (ref 6.4–8.1)

## 2016-08-31 LAB — FERRITIN: Ferritin: 146 ng/ml (ref 9–269)

## 2016-08-31 MED ORDER — ZOLEDRONIC ACID 4 MG/100ML IV SOLN
4.0000 mg | Freq: Once | INTRAVENOUS | Status: AC
  Administered 2016-08-31: 4 mg via INTRAVENOUS
  Filled 2016-08-31: qty 100

## 2016-08-31 MED ORDER — SODIUM CHLORIDE 0.9% FLUSH
10.0000 mL | INTRAVENOUS | Status: DC | PRN
  Administered 2016-08-31: 10 mL via INTRAVENOUS
  Filled 2016-08-31: qty 10

## 2016-08-31 MED ORDER — FENTANYL 100 MCG/HR TD PT72: 100.0000 ug | MEDICATED_PATCH | TRANSDERMAL | 0 refills | Status: DC

## 2016-08-31 MED ORDER — SODIUM CHLORIDE 0.9 % IV SOLN
INTRAVENOUS | Status: DC
  Administered 2016-08-31: 10:00:00 via INTRAVENOUS

## 2016-08-31 MED ORDER — HEPARIN SOD (PORK) LOCK FLUSH 100 UNIT/ML IV SOLN
500.0000 [IU] | Freq: Once | INTRAVENOUS | Status: AC
  Administered 2016-08-31: 500 [IU] via INTRAVENOUS
  Filled 2016-08-31: qty 5

## 2016-08-31 MED ORDER — HYDROMORPHONE HCL 4 MG PO TABS: 4.0000 mg | ORAL_TABLET | ORAL | 0 refills | Status: DC | PRN

## 2016-08-31 MED FILL — ALPRAZolam 1 MG TABS: 1 | 30 days supply | Qty: 60 | Fill #1

## 2016-08-31 MED FILL — PANTOPRAZOLE SOD DR 40 MG T: 40 | 90 days supply | Qty: 90 | Fill #1

## 2016-08-31 MED FILL — HYDROmorphone HCL 4 MG TABS: 4 | 15 days supply | Qty: 90 | Fill #0

## 2016-08-31 MED FILL — MIRTAZAPINE 30 MG TABLET: 30 | 30 days supply | Qty: 30 | Fill #2

## 2016-08-31 MED FILL — fentaNYL 100 MCG/HR PT72: 100 | 30 days supply | Qty: 15 | Fill #0

## 2016-08-31 NOTE — Progress Notes (Signed)
Hematology and Oncology Follow Up Visit  Bianca Kent [id number] February 01, 1966 50 y.o. 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 is back for followup . She actually has gained quite a bit of weight. Her appetite is back. He's having no problems swallowing. There is no dysphagia or odynophagia.  Her problem now is that she probably has tendinitis in the left wrist area and this is being going on for about 3 or 4 weeks. We will see about getting her back to Dr. Rhona Raider who knows her from orthopedic surgery.  She's also complaining of some lateral right chest wall discomfort. She has had this on occasion. There is no erythema. There is no blisters.  She is getting ready to go to [place] for Thanksgiving. I just want her to be able to enjoy the things given holiday.   She is doing well on the Fareston.   She still is not sleeping well. Much or what we can do about this. She is on Benadryl, Remeron, and trazodone . I think this is always been a problem for her.   She's going to the bathroom. She is not as constipated.   She still uses the oxygen. She has the right hemidiaphragm.  Her sister says that she does get tired pretty easily. This might be from some deconditioning. He can also be from her pulmonary issues. Her last echocardiogram was done in July. We might want to consider doing another one just to make sure her cardiac function is not an issue.  She still has some pain issues. She is on fentanyl patch and Dilaudid. Overall, I think this does seem to help a little bit.   Her last CA-27-29 was holding steady at 23.  Overall, her  performance status is ECOG 2.  Medications:  Current Outpatient Prescriptions:  .  albuterol (PROVENTIL HFA;VENTOLIN HFA) 108 (90 BASE) MCG/ACT inhaler, Inhale 2 puffs into the lungs every 4 (four) hours as needed for wheezing or shortness of breath., Disp: 2 Inhaler, Rfl: 6 .  ALPRAZolam (XANAX) 1 MG tablet, Take 1 tablet (1 mg total) by mouth 2 (two) times daily as needed for anxiety., Disp: 60 tablet, Rfl: 2 .  aspirin (ASPIRIN EC) 81 MG EC tablet, Take 81 mg by mouth daily. Swallow whole., Disp: , Rfl:  .  diphenhydrAMINE (SOMINEX) 25 MG tablet, Take 100 mg by mouth at bedtime as needed for sleep. Pt states she is taking up to 200 mg Benadryl at night and still not sleeping, Disp: , Rfl:  .  dronabinol (MARINOL) 10 MG capsule, Take 1 capsule (10 mg total) by mouth 3 (three) times daily before meals., Disp: 90 capsule, Rfl: 0 .  ergocalciferol (VITAMIN D2) 50000 units capsule, Take 1 capsule (50,000 Units total) by mouth 2 (two) times a week., Disp: 8 capsule, Rfl: 6 .  fentaNYL (DURAGESIC - DOSED MCG/HR) 100 MCG/HR, Place 1 patch (100 mcg total) onto the skin every other day., Disp: 15 patch, Rfl: 0 .  HYDROmorphone (DILAUDID) 4 MG tablet, Take 1 tablet (  4 mg total) by mouth every 4 (four) hours as needed for severe pain., Disp: 90 tablet, Rfl: 0 .  Lactulose SOLN, Take 2 TBLSP every 6 hours until bowel movement (Patient taking differently: Take 30 mLs by mouth every 6 (six) hours as needed. Take 2 TBLSP every 6 hours until bowel movement), Disp: 1000 mL, Rfl: 4 .  lidocaine-prilocaine (EMLA) cream, Apply 1 application topically as needed., Disp: 30 g, Rfl: 6 .  loratadine-pseudoephedrine (CLARITIN-D 24-HOUR) 10-240 MG per 24 hr tablet, Take 1 tablet by mouth daily., Disp: , Rfl:  .  methylphenidate (RITALIN) 10 MG tablet, Take 1 tablet (10 mg total) by mouth 2 (two) times daily., Disp: 60 tablet, Rfl: 0 .  mirtazapine (REMERON) 30 MG tablet, Take 1 tablet (30 mg total) by mouth at bedtime.,  Disp: 30 tablet, Rfl: 3 .  Multiple Vitamin (MULTIVITAMIN) tablet, Take 1 tablet by mouth daily., Disp: , Rfl:  .  Nystatin POWD, 1 application by Does not apply route 2 (two) times daily., Disp: 1 Bottle, Rfl: 3 .  ondansetron (ZOFRAN) 8 MG tablet, Take 1 tablet (8 mg total) by mouth every 8 (eight) hours as needed for nausea or vomiting., Disp: 60 tablet, Rfl: 2 .  oxybutynin (DITROPAN) 5 MG tablet, Take 1 tablet (5 mg total) by mouth 2 (two) times daily., Disp: 60 tablet, Rfl: 0 .  pantoprazole (PROTONIX) 40 MG tablet, Take 1 tablet (40 mg total) by mouth daily., Disp: 30 tablet, Rfl: 6 .  promethazine (PHENERGAN) 25 MG suppository, Place 1 suppository (25 mg total) rectally every 6 (six) hours as needed for nausea or vomiting., Disp: 60 each, Rfl: 3 .  Sennosides-Docusate Sodium (SENNA-S PO), Take 1 tablet by mouth as needed., Disp: , Rfl:  .  Toremifene Citrate (FARESTON) 60 MG tablet, Take 1 tablet (60 mg total) by mouth daily., Disp: 30 tablet, Rfl: 6 .  traZODone (DESYREL) 100 MG tablet, TAKE 1 TO 2 TABLETS BY MOUTH AS NEEDED FOR SLEEP, Disp: 60 tablet, Rfl: 2 No current facility-administered medications for this visit.   Facility-Administered Medications Ordered in Other Visits:  .  0.9 %  sodium chloride infusion, , Intravenous, Continuous, [name], MD, Last Rate: 20 mL/hr at 08/31/16 0956 .  HYDROmorphone (DILAUDID) injection 2 mg, 2 mg, Intravenous, Q4H PRN, [name], MD, 2 mg at 04/20/16 1233 .  Zoledronic Acid (ZOMETA) 4 mg IVPB, 4 mg, Intravenous, Once, [name], MD, Last Rate: 133.3 mL/hr at 08/31/16 1003, 4 mg at 08/31/16 1003  Allergies:  Allergies  Allergen Reactions  . Iohexol      Code: RASH, Desc: VERY STRONG FAMILY HX OF ANGIOEDEMA WHEN RECEIVING IV CONTRAST; PT HAS BEEN PREMEDICATED FOR OTHER CONTRASTED STUDIES(IN CATH. LAB)  KR, Onset Date: [id number]   . Prednisone Itching    Capillary beds bust  . Tetanus Toxoids Other (See Comments)    Ran a  high fever for 48 hours  . Theophyllines Hives    Mental changes  . Versed [Midazolam] Other (See Comments)    Pt becomes violent    Past Medical History, Surgical history, Social history, and Family History were reviewed and updated.  Review of Systems: As above  Physical Exam:  height is 5\' 7"  (1.702 m) and weight is 186 lb (84.4 kg). Her oral temperature is 98.2 F (36.8 C). Her blood pressure is 118/80 and her pulse is 83. Her respiration is 16.   Well-developed and well-nourished white female. Head and exam shows  no ocular or oral lesions. She's able to move both eyes well. She has good extraocular muscle . There is no erythema or exudate from the left eye. There might be some slight fullness in the right axilla by do not detect any obvious adenopathy. I cannot detect any obvious right supraclavicular lymph nodes. She has improved range of motion of the right shoulder. Left supraclavicular region is okay. No masses are noted. Lungs are clear. Cardiac exam regular rate and rhythm with no murmurs, rubs or bruits.. Abdomen is soft. She has good bowel sounds. There is no palpable liver or spleen tip. Back exam shows decreased tenderness over the lumbar and thoracic spine to palpation. Extremities shows no clubbing, cyanosis or edema. She has 4/5 strength in her legs. Skin exam shows improvement in the radiation dermatitis of the right breast and axilla. There is no open wound. There is some hyper pigmentation. Neurological exam is nonfocal.    Lab Results  Component Value Date   WBC 4.5 08/31/2016   HGB 12.7 08/31/2016   HCT 38.6 08/31/2016   MCV 90 08/31/2016   PLT 98 (L) 08/31/2016     Chemistry      Component Value Date/Time   NA 140 08/31/2016 0836   NA 138 04/30/2015 1019   K 4.1 08/31/2016 0836   K 3.5 04/30/2015 1019   CL 102 08/31/2016 0836   CO2 28 08/31/2016 0836   CO2 29 04/30/2015 1019   BUN 12 08/31/2016 0836   BUN 9.7 04/30/2015 1019   CREATININE 0.9 08/31/2016  0836   CREATININE 0.8 04/30/2015 1019      Component Value Date/Time   CALCIUM 8.8 08/31/2016 0836   CALCIUM 9.7 04/30/2015 1019   ALKPHOS 148 (H) 08/31/2016 0836   ALKPHOS 257 (H) 04/30/2015 1019   AST 28 08/31/2016 0836   AST 57 (H) 04/30/2015 1019   ALT 29 08/31/2016 0836   ALT 41 04/30/2015 1019   BILITOT 0.50 08/31/2016 0836   BILITOT 0.60 04/30/2015 1019         Impression and Plan: Bianca Kent is 50 year old white female with triple positive metastatic breast cancer.  I does have her on Fareston. She has a much better quality of life from my point of view. I think she is able to do more. She has the chronic oxygen requirements from the right hemidiaphragm so this is going to limit what she can do.  I think that she is having another PET scan done area and her last one was done about 3 or 4 months ago. I would like to hope that given her normal CA 27.29, that she will have no evidence of disease progression.  Again, our focus now is her quality of life. We are trying to keep her medications to as little as possible. She does still take quite a few.  We will plan to get the PET scan about 3 or 4 weeks. I will plan to get her back before Christmas and we will see how she's doing.  As all his, we had a very good prayer session. She and her sister both appreciated the fact that we can pray after each office visit.   [name], MD 11/9/201710:35 AM

## 2016-08-31 NOTE — Patient Instructions (Signed)

## 2016-09-01 LAB — CANCER ANTIGEN 27.29: CA 27.29: 21.2 U/mL (ref 0.0–38.6)

## 2016-09-04 ENCOUNTER — Other Ambulatory Visit: Payer: Self-pay | Admitting: *Deleted

## 2016-09-04 ENCOUNTER — Telehealth: Payer: Self-pay | Admitting: *Deleted

## 2016-09-04 DIAGNOSIS — K909 Intestinal malabsorption, unspecified: Secondary | ICD-10-CM

## 2016-09-04 NOTE — Telephone Encounter (Addendum)
Robin aware of results. Message sent to scheduler  ----- Message from [name], MD sent at 08/31/2016  5:20 PM EST ----- Call her sister and tell her that Diane is iron is borderline low. I think she would benefit from 1 dose of Feraheme that might help with her fatigue. Please set this up for next week. Thanks

## 2016-09-06 DIAGNOSIS — M778 Other enthesopathies, not elsewhere classified: Secondary | ICD-10-CM | POA: Diagnosis not present

## 2016-09-07 ENCOUNTER — Ambulatory Visit (HOSPITAL_BASED_OUTPATIENT_CLINIC_OR_DEPARTMENT_OTHER): Payer: 59

## 2016-09-07 DIAGNOSIS — D509 Iron deficiency anemia, unspecified: Secondary | ICD-10-CM

## 2016-09-07 DIAGNOSIS — K909 Intestinal malabsorption, unspecified: Secondary | ICD-10-CM

## 2016-09-07 MED ORDER — SODIUM CHLORIDE 0.9 % IV SOLN
INTRAVENOUS | Status: DC
  Administered 2016-09-07: 11:00:00 via INTRAVENOUS

## 2016-09-07 MED ORDER — SODIUM CHLORIDE 0.9 % IV SOLN
510.0000 mg | Freq: Once | INTRAVENOUS | Status: AC
  Administered 2016-09-07: 510 mg via INTRAVENOUS
  Filled 2016-09-07: qty 17

## 2016-09-07 NOTE — Patient Instructions (Signed)
Ferumoxytol injection What is this medicine? FERUMOXYTOL is an iron complex. Iron is used to make healthy red blood cells, which carry oxygen and nutrients throughout the body. This medicine is used to treat iron deficiency anemia in people with chronic kidney disease. COMMON BRAND NAME(S): Feraheme What should I tell my health care provider before I take this medicine? They need to know if you have any of these conditions: -anemia not caused by low iron levels -high levels of iron in the blood -magnetic resonance imaging (MRI) test scheduled -an unusual or allergic reaction to iron, other medicines, foods, dyes, or preservatives -pregnant or trying to get pregnant -breast-feeding How should I use this medicine? This medicine is for injection into a vein. It is given by a health care professional in a hospital or clinic setting. Talk to your pediatrician regarding the use of this medicine in children. Special care may be needed. What if I miss a dose? It is important not to miss your dose. Call your doctor or health care professional if you are unable to keep an appointment. What may interact with this medicine? This medicine may interact with the following medications: -other iron products What should I watch for while using this medicine? Visit your doctor or healthcare professional regularly. Tell your doctor or healthcare professional if your symptoms do not start to get better or if they get worse. You may need blood work done while you are taking this medicine. You may need to follow a special diet. Talk to your doctor. Foods that contain iron include: whole grains/cereals, dried fruits, beans, or peas, leafy green vegetables, and organ meats (liver, kidney). What side effects may I notice from receiving this medicine? Side effects that you should report to your doctor or health care professional as soon as possible: -allergic reactions like skin rash, itching or hives, swelling of the  face, lips, or tongue -breathing problems -changes in blood pressure -feeling faint or lightheaded, falls -fever or chills -flushing, sweating, or hot feelings -swelling of the ankles or feet Side effects that usually do not require medical attention (report to your doctor or health care professional if they continue or are bothersome): -diarrhea -headache -nausea, vomiting -stomach pain Where should I keep my medicine? This drug is given in a hospital or clinic and will not be stored at home.  2017 Elsevier/Gold Standard (2015-11-11 12:41:49)  

## 2016-09-20 DIAGNOSIS — H43811 Vitreous degeneration, right eye: Secondary | ICD-10-CM | POA: Diagnosis not present

## 2016-09-20 DIAGNOSIS — H43393 Other vitreous opacities, bilateral: Secondary | ICD-10-CM | POA: Diagnosis not present

## 2016-09-21 DIAGNOSIS — J9601 Acute respiratory failure with hypoxia: Secondary | ICD-10-CM | POA: Diagnosis not present

## 2016-09-28 ENCOUNTER — Ambulatory Visit (HOSPITAL_COMMUNITY)
Admission: RE | Admit: 2016-09-28 | Discharge: 2016-09-28 | Disposition: A | Discharge status: Home / Self Care | Payer: 59 | Source: Ambulatory Visit | Attending: Hematology & Oncology | Admitting: Hematology & Oncology

## 2016-09-28 DIAGNOSIS — J323 Chronic sphenoidal sinusitis: Secondary | ICD-10-CM | POA: Diagnosis not present

## 2016-09-28 DIAGNOSIS — C787 Secondary malignant neoplasm of liver and intrahepatic bile duct: Secondary | ICD-10-CM | POA: Insufficient documentation

## 2016-09-28 DIAGNOSIS — J321 Chronic frontal sinusitis: Secondary | ICD-10-CM | POA: Diagnosis not present

## 2016-09-28 DIAGNOSIS — J9811 Atelectasis: Secondary | ICD-10-CM | POA: Diagnosis not present

## 2016-09-28 DIAGNOSIS — C50911 Malignant neoplasm of unspecified site of right female breast: Secondary | ICD-10-CM | POA: Insufficient documentation

## 2016-09-28 DIAGNOSIS — Z9889 Other specified postprocedural states: Secondary | ICD-10-CM | POA: Insufficient documentation

## 2016-09-28 HISTORY — DX: Secondary malignant neoplasm of liver and intrahepatic bile duct: C78.7

## 2016-09-28 LAB — GLUCOSE, CAPILLARY: GLUCOSE-CAPILLARY: 100 mg/dL — AB (ref 65–99)

## 2016-09-28 MED ORDER — FLUDEOXYGLUCOSE F - 18 (FDG) INJECTION
9.2300 | Freq: Once | INTRAVENOUS | Status: AC | PRN
  Administered 2016-09-28: 9.23 via INTRAVENOUS

## 2016-10-03 ENCOUNTER — Encounter: Payer: Self-pay | Admitting: Radiation Oncology

## 2016-10-03 NOTE — Progress Notes (Signed)
GI Location of Tumor / Histology: Stage IV  Right breast cancer triple neg/brain mets now solitary liver mets  Bianca Kent presented  months ago with symptoms of:   Biopsies of  (if applicable) revealed: none   Past/Anticipated interventions by surgeon, if any: no  Past/Anticipated interventions by medical oncology, if any: Dr. Marin Kent, Current Therapy:  08/31/16:       Herceptin q 3wk dosing - on hold  Perjeta every 3 week dosing - on hold  Zometa 4 mg IV every 6 weeks, next due 10/19/16       Faslodex 500 mg IM q month - on hold       Fareston 60mg  po q day - start 07/20/2016  PET SCAN 09/28/2016: IMPRESSION: 1. The right hepatic lobe metastatic lesion is larger and more hypermetabolic, currently 2.0 by 1.8 cm with SUV of 21.3. 2. There is a questionable focus of hypermetabolic activity along the upper margin of the lateral segment left hepatic lobe, maximum SUV 6.6. Background liver activity is about 5.5 and so this is very questionable. There is no CT correlate.   Weight changes, if any:  Weight gain almost  20 lbs past two months  Bowel/Bladder complaints, if any: diarrhea  Now takes imodium, mucousy, back and forth to solid constipation,   Bladder doesn't empty fully, nocturia x3  Nausea / Vomiting, if any: NO  Pain issues, if any: on fentanyl patch and Dilaudid   Any blood per rectum: NO  SAFETY ISSUES: YEs, on 24 hour oxygen,   Prior radiation? Yes,    12/16/14: Left temporal  35mm tumor/20GY/1 fraction ,Dr. Isidore Moos        08/01/14-09/16/14=Right Breast and axillary and supraclavicular region, total dose 50.4 Gy per Dr. Sondra Come   Pacemaker/ICD? NO  Possible current pregnancy? NO  Is the patient on methotrexate? NO  Current Complaints/Details:on chronic Oxygen 2 liters n/c, depression/anxiety  BP (!) 151/90 (BP Location: Left Arm, Patient Position: Sitting, Cuff Size: Normal)   Pulse 91   Temp 97.8 F (36.6 C) (Oral)   Resp 20   Ht 5\' 7"  (1.702 m)    Wt 192 lb 9.6 oz (87.4 kg)   LMP 04/22/2014   SpO2 98% Comment: 2 liters n/c  BMI 30.17 kg/m   Wt Readings from Last 3 Encounters:  10/04/16 192 lb 9.6 oz (87.4 kg)  08/31/16 186 lb (84.4 kg)  07/20/16 169 lb (76.7 kg)

## 2016-10-04 ENCOUNTER — Ambulatory Visit
Admission: RE | Admit: 2016-10-04 | Discharge: 2016-10-04 | Disposition: A | Discharge status: Home / Self Care | Payer: 59 | Source: Ambulatory Visit | Attending: Radiation Oncology | Admitting: Radiation Oncology

## 2016-10-04 ENCOUNTER — Encounter: Payer: Self-pay | Admitting: Radiation Oncology

## 2016-10-04 VITALS — BP 151/90 | HR 91 | Temp 97.8°F | Resp 20 | Ht 67.0 in | Wt 192.6 lb

## 2016-10-04 DIAGNOSIS — Z7982 Long term (current) use of aspirin: Secondary | ICD-10-CM | POA: Insufficient documentation

## 2016-10-04 DIAGNOSIS — C787 Secondary malignant neoplasm of liver and intrahepatic bile duct: Secondary | ICD-10-CM | POA: Insufficient documentation

## 2016-10-04 DIAGNOSIS — Z510 Encounter for antineoplastic radiation therapy: Secondary | ICD-10-CM | POA: Diagnosis present

## 2016-10-04 DIAGNOSIS — C50911 Malignant neoplasm of unspecified site of right female breast: Secondary | ICD-10-CM | POA: Insufficient documentation

## 2016-10-04 DIAGNOSIS — Z923 Personal history of irradiation: Secondary | ICD-10-CM | POA: Diagnosis not present

## 2016-10-04 DIAGNOSIS — Z170 Estrogen receptor positive status [ER+]: Secondary | ICD-10-CM | POA: Insufficient documentation

## 2016-10-04 DIAGNOSIS — Z79899 Other long term (current) drug therapy: Secondary | ICD-10-CM | POA: Diagnosis not present

## 2016-10-04 HISTORY — DX: Secondary malignant neoplasm of liver and intrahepatic bile duct: C78.7

## 2016-10-04 NOTE — Progress Notes (Signed)
Please see the Nurse Progress Note in the MD Initial Consult Encounter for this patient. 

## 2016-10-04 NOTE — Progress Notes (Signed)
Radiation Oncology         ([phone]) [phone] ________________________________  Name: Bianca Kent MRN: [id number]  Date: 10/04/2016  DOB: 1966/04/21  Follow-Up Visit Note  Outpatient  CC: [name], MD  [name], MD  Diagnosis and Prior Radiotherapy:    ICD-9-CM ICD-10-CM   1. 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 Sondra Come.  Narrative:  The patient returns today for a re-consultation. The patient is being followed systemically by Dr. [name].  Current Therapy:  Herceptin q 3wk dosing - on hold  Perjeta every 3 week dosing - on hold  Zometa 4 mg IV every 6 weeks       Faslodex 500 mg IM q month - on hold       Fareston 60mg  po q day - start 07/20/2016  An MRI of the brain was performed on 08/07/16 and this showed the metastatic deposit in the left posterior temporal lobe was no longer visualized and no evidence of metastatic disease in the brain.  Unfortunately, PET scan on 09/28/16 showed that the right hepatic lobe metastatic lesion is larger and more hypermetabolic measuring 2.0 x 1.8 cm with SUV of 21.3 and there was a questionable focus of hypermetabolic activity along the upper margin of the lateral segment left hepatic lobe, maximum SUV 6.6. Background liver activity was about 5.5, therefore this is very questionable.  The patient presents today to discuss radiation to the right hepatic lobe metastatic lesion.  On review of systems: The patient reports a weight gain of almost 20 lbs in the past 2 months, diarrhea (now takeing imodium, mucousy, back and forth to solid  constipation), a bladder that does not fully empty, nocturia x3, pain (for which she is on a fentanyl patch and dilaudid), and depression/anxiety. The patient has difficulty breathing and is on 2L of oxygen via nasal cannula.  ALLERGIES:  is allergic to iohexol; prednisone; tetanus toxoids; theophyllines; and versed [midazolam].  Meds: Current Outpatient Prescriptions  Medication Sig Dispense Refill  . albuterol (PROVENTIL HFA;VENTOLIN HFA) 108 (90 BASE) MCG/ACT inhaler Inhale 2 puffs into the lungs every 4 (four) hours as needed for wheezing or shortness of breath. 2 Inhaler 6  . ALPRAZolam (XANAX) 1 MG tablet Take 1 tablet (1 mg total) by mouth 2 (two) times daily as needed for anxiety. 60 tablet 2  . aspirin (ASPIRIN EC) 81 MG EC tablet Take 81 mg by mouth daily. Swallow whole.    . diphenhydrAMINE (SOMINEX) 25 MG tablet Take 100 mg by mouth at bedtime as needed for sleep. Pt states she is taking up to 200 mg Benadryl at night and still not sleeping    . dronabinol (MARINOL) 10 MG capsule Take 1 capsule (10 mg total) by mouth 3 (three) times daily before meals. 90 capsule 0  . ergocalciferol (VITAMIN D2) 50000 units capsule Take 1 capsule (50,000 Units total) by mouth 2 (two) times a week. 8 capsule 6  . fentaNYL (DURAGESIC - DOSED MCG/HR) 100 MCG/HR Place 1 patch (100 mcg total) onto the skin every other day. 15 patch 0  . HYDROmorphone (DILAUDID)  4 MG tablet Take 1 tablet (4 mg total) by mouth every 4 (four) hours as needed for severe pain. 90 tablet 0  . lidocaine-prilocaine (EMLA) cream Apply 1 application topically as needed. 30 g 6  . loratadine-pseudoephedrine (CLARITIN-D 24-HOUR) 10-240 MG per 24 hr tablet Take 1 tablet by mouth daily.    . methylphenidate (RITALIN) 10 MG tablet Take 1 tablet (10 mg total) by mouth 2 (two) times daily. 60 tablet 0  . mirtazapine (REMERON) 30 MG tablet Take 1 tablet (30 mg total) by mouth at bedtime. 30 tablet 3  . Multiple Vitamin (MULTIVITAMIN) tablet  Take 1 tablet by mouth daily.    . ondansetron (ZOFRAN) 8 MG tablet Take 1 tablet (8 mg total) by mouth every 8 (eight) hours as needed for nausea or vomiting. 60 tablet 2  . pantoprazole (PROTONIX) 40 MG tablet Take 1 tablet (40 mg total) by mouth daily. 30 tablet 6  . promethazine (PHENERGAN) 25 MG suppository Place 1 suppository (25 mg total) rectally every 6 (six) hours as needed for nausea or vomiting. 60 each 3  . Sennosides-Docusate Sodium (SENNA-S PO) Take 1 tablet by mouth as needed.    . Toremifene Citrate (FARESTON) 60 MG tablet Take 1 tablet (60 mg total) by mouth daily. 30 tablet 6  . traZODone (DESYREL) 100 MG tablet TAKE 1 TO 2 TABLETS BY MOUTH AS NEEDED FOR SLEEP 60 tablet 2  . Lactulose SOLN Take 2 TBLSP every 6 hours until bowel movement (Patient not taking: Reported on 10/04/2016) 1000 mL 4  . Nystatin POWD 1 application by Does not apply route 2 (two) times daily. (Patient not taking: Reported on 10/04/2016) 1 Bottle 3  . oxybutynin (DITROPAN) 5 MG tablet Take 1 tablet (5 mg total) by mouth 2 (two) times daily. (Patient not taking: Reported on 10/04/2016) 60 tablet 0   No current facility-administered medications for this encounter.    Facility-Administered Medications Ordered in Other Encounters  Medication Dose Route Frequency Provider Last Rate Last Dose  . HYDROmorphone (DILAUDID) injection 2 mg  2 mg Intravenous Q4H PRN [name], MD   2 mg at 04/20/16 1233    Physical Findings: The patient is in no acute distress. Patient is alert and oriented.  height is 5\' 7"  (1.702 m) and weight is 192 lb 9.6 oz (87.4 kg). Her oral temperature is 97.8 F (36.6 C). Her blood pressure is 151/90 (abnormal) and her pulse is 91. Her respiration is 20 and oxygen saturation is 98%. .    Nasal cannula in place with 2L of oxygen.  Lab Findings: Lab Results  Component Value Date   WBC 4.5 08/31/2016   HGB 12.7 08/31/2016   HCT 38.6 08/31/2016   MCV 90 08/31/2016   PLT 98 (L)  08/31/2016    Radiographic Findings: Nm Pet Image Restag (ps) Skull Base To Thigh  Result Date: 09/28/2016 CLINICAL DATA:  Subsequent treatment strategy for metastatic right breast cancer. EXAM: NUCLEAR MEDICINE PET SKULL BASE TO THIGH TECHNIQUE: 9.2 mCi F-18 FDG was injected intravenously. Full-ring PET imaging was performed from the skull base to thigh after the radiotracer. CT data was obtained and used for attenuation correction and anatomic localization. FASTING BLOOD GLUCOSE:  Value: 100 mg/dl COMPARISON:  05/03/2016 FINDINGS: NECK No hypermetabolic lymph nodes in the neck. Prior ethmoidectomies. Chronic right sphenoid sinusitis. Chronic right frontal sinusitis including complete opacification of the right frontal sinus. CHEST No hypermetabolic mediastinal or hilar nodes. No suspicious pulmonary nodules on the CT  scan. Atelectasis along the right hemidiaphragm with atelectasis or scarring in the right upper lobe. Mild atelectasis medially in the left lower lobe. Left Port-A-Cath tip: Cavoatrial junction. Postoperative findings in the right axilla. No pathologic adenopathy. Previous focal hypermetabolic activity in the left upper lobe without CT correlate is no longer appreciated and may well have been artifactual. ABDOMEN/PELVIS A hypodense lesion in segment 7 measures 2.0 by 1.8 cm, formerly approximately 1.1 by 1.1 cm, and has a maximum standard uptake value of 21.3 (formerly 11.1 there is some speckled heterogeneity of activity in the liver, questionable lesion along the dome of segment 2 with maximum SUV 6.6. Background liver activity approximately 5.5. Physiologic activity in bowel. Chronic stranding along the right paracolic gutter region, without hypermetabolic activity. SKELETON No significant abnormal osseous hypermetabolic activity is identified to suggest active bony malignancy. IMPRESSION: 1. The right hepatic lobe metastatic lesion is larger and more hypermetabolic, currently 2.0 by 1.8 cm  with SUV of 21.3. 2. There is a questionable focus of hypermetabolic activity along the upper margin of the lateral segment left hepatic lobe, maximum SUV 6.6. Background liver activity is about 5.5 and so this is very questionable. There is no CT correlate. Electronically Signed   By: Van Clines M.D.   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 Radiation to this lesion may be warranted in this setting.  I discussed the process of radiation and simulation. The patient understands the risks and benefits of treatment well since she has previously been treated by radiation.  Plan: I will order an MRI of the abdomen to better visualize the liver. We also discussed the placement of fiducial markers, assuming we will proceed with this plan after the patient's upcoming MRI scan. The patient would like to proceed with radiation. The patient signed a consent form and a copy was placed in her medical chart. The patient would like to have simulation and treatment begin after December 28th. This is appropriate.  _____________________________________  ------------------------------------------------  Jodelle Gross, MD, PhD  This document serves as a record of services personally performed by Kyung Rudd, MD. It was created on his behalf by Darcus Austin, a trained medical scribe. The creation of this record is based on the scribe's personal observations and the provider's statements to them. This document has been checked and approved by the attending provider.

## 2016-10-05 ENCOUNTER — Other Ambulatory Visit: Payer: Self-pay | Admitting: Radiation Oncology

## 2016-10-05 DIAGNOSIS — C787 Secondary malignant neoplasm of liver and intrahepatic bile duct: Secondary | ICD-10-CM

## 2016-10-06 ENCOUNTER — Other Ambulatory Visit: Payer: Self-pay | Admitting: Radiation Oncology

## 2016-10-06 DIAGNOSIS — C787 Secondary malignant neoplasm of liver and intrahepatic bile duct: Secondary | ICD-10-CM

## 2016-10-12 ENCOUNTER — Other Ambulatory Visit: Payer: 59

## 2016-10-12 ENCOUNTER — Ambulatory Visit: Payer: 59

## 2016-10-12 ENCOUNTER — Ambulatory Visit: Payer: 59 | Admitting: Hematology & Oncology

## 2016-10-13 MED FILL — FARESTON 60 MG TABLET: 60 | 30 days supply | Qty: 30 | Fill #2

## 2016-10-13 MED FILL — MIRTAZAPINE 30 MG TABLET: 30 | 30 days supply | Qty: 30 | Fill #3

## 2016-10-13 MED FILL — traZODone HCL 100 MG TABS: 100 | 30 days supply | Qty: 60 | Fill #2

## 2016-10-19 ENCOUNTER — Institutional Professional Consult (permissible substitution): Payer: Self-pay | Admitting: Radiation Oncology

## 2016-10-19 ENCOUNTER — Ambulatory Visit (HOSPITAL_BASED_OUTPATIENT_CLINIC_OR_DEPARTMENT_OTHER): Payer: 59 | Admitting: Hematology & Oncology

## 2016-10-19 ENCOUNTER — Other Ambulatory Visit (HOSPITAL_BASED_OUTPATIENT_CLINIC_OR_DEPARTMENT_OTHER): Payer: 59

## 2016-10-19 ENCOUNTER — Other Ambulatory Visit: Payer: Self-pay

## 2016-10-19 ENCOUNTER — Ambulatory Visit (HOSPITAL_BASED_OUTPATIENT_CLINIC_OR_DEPARTMENT_OTHER): Payer: 59

## 2016-10-19 ENCOUNTER — Ambulatory Visit: Payer: 59

## 2016-10-19 VITALS — BP 121/77 | HR 86 | Temp 97.5°F | Resp 20 | Wt 194.2 lb

## 2016-10-19 DIAGNOSIS — C50911 Malignant neoplasm of unspecified site of right female breast: Secondary | ICD-10-CM | POA: Diagnosis not present

## 2016-10-19 DIAGNOSIS — R197 Diarrhea, unspecified: Secondary | ICD-10-CM | POA: Diagnosis not present

## 2016-10-19 DIAGNOSIS — C7931 Secondary malignant neoplasm of brain: Secondary | ICD-10-CM

## 2016-10-19 DIAGNOSIS — Z9981 Dependence on supplemental oxygen: Secondary | ICD-10-CM

## 2016-10-19 DIAGNOSIS — R160 Hepatomegaly, not elsewhere classified: Secondary | ICD-10-CM | POA: Diagnosis not present

## 2016-10-19 DIAGNOSIS — Z9221 Personal history of antineoplastic chemotherapy: Secondary | ICD-10-CM | POA: Diagnosis not present

## 2016-10-19 DIAGNOSIS — R300 Dysuria: Secondary | ICD-10-CM

## 2016-10-19 DIAGNOSIS — M810 Age-related osteoporosis without current pathological fracture: Secondary | ICD-10-CM

## 2016-10-19 LAB — URINALYSIS, MICROSCOPIC (CHCC SATELLITE)
BLOOD: NEGATIVE
Bilirubin (Urine): NEGATIVE
GLUCOSE UR: NEGATIVE mg/dL
Ketones: NEGATIVE mg/dL
LEUKOCYTE ESTERASE: NEGATIVE
NITRITE: POSITIVE
PH: 6 (ref 4.60–8.00)
PROTEIN: NEGATIVE mg/dL
SPECIFIC GRAVITY, URINE: 1.03 (ref 1.003–1.035)
UROBILINOGEN UR: 0.2 mg/dL (ref 0.2–1)

## 2016-10-19 LAB — CMP (CANCER CENTER ONLY)
ALBUMIN: 3.9 g/dL (ref 3.3–5.5)
ALT(SGPT): 29 U/L (ref 10–47)
AST: 31 U/L (ref 11–38)
Alkaline Phosphatase: 140 U/L — ABNORMAL HIGH (ref 26–84)
BUN, Bld: 12 mg/dL (ref 7–22)
CHLORIDE: 106 meq/L (ref 98–108)
CO2: 28 mEq/L (ref 18–33)
Calcium: 8.9 mg/dL (ref 8.0–10.3)
Creat: 0.8 mg/dl (ref 0.6–1.2)
Glucose, Bld: 115 mg/dL (ref 73–118)
POTASSIUM: 4 meq/L (ref 3.3–4.7)
Sodium: 146 mEq/L — ABNORMAL HIGH (ref 128–145)
TOTAL PROTEIN: 7.6 g/dL (ref 6.4–8.1)
Total Bilirubin: 0.6 mg/dl (ref 0.20–1.60)

## 2016-10-19 LAB — CBC WITH DIFFERENTIAL (CANCER CENTER ONLY)
BASO#: 0 10*3/uL (ref 0.0–0.2)
BASO%: 0.6 % (ref 0.0–2.0)
EOS ABS: 0.1 10*3/uL (ref 0.0–0.5)
EOS%: 2.4 % (ref 0.0–7.0)
HCT: 39.5 % (ref 34.8–46.6)
HEMOGLOBIN: 13.4 g/dL (ref 11.6–15.9)
LYMPH#: 1 10*3/uL (ref 0.9–3.3)
LYMPH%: 19.2 % (ref 14.0–48.0)
MCH: 30.8 pg (ref 26.0–34.0)
MCHC: 33.9 g/dL (ref 32.0–36.0)
MCV: 91 fL (ref 81–101)
MONO#: 0.4 10*3/uL (ref 0.1–0.9)
MONO%: 6.8 % (ref 0.0–13.0)
NEUT%: 71 % (ref 39.6–80.0)
NEUTROS ABS: 3.8 10*3/uL (ref 1.5–6.5)
PLATELETS: 89 10*3/uL — AB (ref 145–400)
RBC: 4.35 10*6/uL (ref 3.70–5.32)
RDW: 14.4 % (ref 11.1–15.7)
WBC: 5.3 10*3/uL (ref 3.9–10.0)

## 2016-10-19 MED ORDER — ZOLEDRONIC ACID 4 MG/100ML IV SOLN
4.0000 mg | Freq: Once | INTRAVENOUS | Status: AC
Start: 2016-10-19 — End: 2016-10-19
  Administered 2016-10-19: 4 mg via INTRAVENOUS
  Filled 2016-10-19: qty 100

## 2016-10-19 MED ORDER — SODIUM CHLORIDE 0.9% FLUSH
10.0000 mL | INTRAVENOUS | Status: DC | PRN
  Administered 2016-10-19: 10 mL via INTRAVENOUS
  Filled 2016-10-19: qty 10

## 2016-10-19 MED ORDER — SODIUM CHLORIDE 0.9 % IV SOLN
Freq: Once | INTRAVENOUS | Status: AC
  Administered 2016-10-19: 10:00:00 via INTRAVENOUS

## 2016-10-19 MED ORDER — HEPARIN SOD (PORK) LOCK FLUSH 100 UNIT/ML IV SOLN
500.0000 [IU] | Freq: Once | INTRAVENOUS | Status: AC
  Administered 2016-10-19: 500 [IU] via INTRAVENOUS
  Filled 2016-10-19: qty 5

## 2016-10-19 NOTE — Patient Instructions (Signed)

## 2016-10-19 NOTE — Addendum Note (Signed)
Addended by: Burney Gauze R on: 10/19/2016 11:37 AM   Modules accepted: Orders

## 2016-10-19 NOTE — Progress Notes (Signed)
Hematology and Oncology Follow Up Visit  Bianca Kent [id number] 1966-09-11 50 y.o. 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 is back for followup . Bianca Kent'll extent gastric. Bianca Kent really has come around nicely since being off Herceptin and Perjeta.  Bianca Kent had a wonderful Thanksgiving and a wonderful Christmas. Bianca Kent was with Bianca Kent family. Bianca Kent really enjoyed being with Bianca Kent family and being able to participate in activities.  Bianca Kent last PET scan bases showed a solitary liver lesion. This was in the segment 7. This is in the right lobe of the liver. It measures 2 x 1.8 cm. We will see if radiation oncology can treat this with stereotactic radiosurgery which is becoming more used in metastatic breast cancer.  Bianca Kent now has diarrhea. Bianca Kent had wasn't constipated. I told Bianca Kent to try some Kaopectate.  Bianca Kent still is on oxygen. I did this will always be the case.  Bianca Kent last CA 27.29 was holding nice steady at 21.2.  Overall, I said that Bianca Kent performance status is ECOG 1.  Pain has been under fairly good control.  I think Bianca Kent may still be smoking a little bit.  Bianca Kent is just very grateful that Bianca Kent has been able to have a much better quality of life since being off IV therapy.  Medications:  Current Outpatient Prescriptions:  .  albuterol (PROVENTIL HFA;VENTOLIN HFA) 108 (90 BASE) MCG/ACT inhaler, Inhale 2 puffs into the lungs every 4 (four) hours as needed for wheezing or shortness of breath., Disp: 2 Inhaler, Rfl: 6 .  ALPRAZolam (XANAX) 1 MG tablet, Take 1 tablet (1 mg total) by mouth 2 (two) times daily as needed for anxiety., Disp: 60 tablet, Rfl: 2 .  aspirin  (ASPIRIN EC) 81 MG EC tablet, Take 81 mg by mouth daily. Swallow whole., Disp: , Rfl:  .  diphenhydrAMINE (SOMINEX) 25 MG tablet, Take 100 mg by mouth at bedtime as needed for sleep. Pt states Bianca Kent is taking up to 200 mg Benadryl at night and still not sleeping, Disp: , Rfl:  .  dronabinol (MARINOL) 10 MG capsule, Take 1 capsule (10 mg total) by mouth 3 (three) times daily before meals., Disp: 90 capsule, Rfl: 0 .  ergocalciferol (VITAMIN D2) 50000 units capsule, Take 1 capsule (50,000 Units total) by mouth 2 (two) times a week., Disp: 8 capsule, Rfl: 6 .  fentaNYL (DURAGESIC - DOSED MCG/HR) 100 MCG/HR, Place 1 patch (100 mcg total) onto the skin every other day., Disp: 15 patch, Rfl: 0 .  HYDROmorphone (DILAUDID) 4 MG tablet, Take 1 tablet (4 mg total) by mouth every 4 (four) hours as needed for severe pain., Disp: 90 tablet, Rfl: 0 .  Lactulose SOLN, Take 2 TBLSP every 6 hours until bowel movement, Disp: 1000 mL, Rfl: 4 .  lidocaine-prilocaine (EMLA) cream, Apply 1 application topically as needed., Disp: 30 g, Rfl: 6 .  loratadine-pseudoephedrine (CLARITIN-D 24-HOUR) 10-240 MG per 24 hr tablet, Take 1 tablet by mouth daily., Disp: , Rfl:  .  methylphenidate (RITALIN) 10 MG tablet, Take 1 tablet (10  mg total) by mouth 2 (two) times daily., Disp: 60 tablet, Rfl: 0 .  mirtazapine (REMERON) 30 MG tablet, Take 1 tablet (30 mg total) by mouth at bedtime., Disp: 30 tablet, Rfl: 3 .  Multiple Vitamin (MULTIVITAMIN) tablet, Take 1 tablet by mouth daily., Disp: , Rfl:  .  Nystatin POWD, 1 application by Does not apply route 2 (two) times daily., Disp: 1 Bottle, Rfl: 3 .  ondansetron (ZOFRAN) 8 MG tablet, Take 1 tablet (8 mg total) by mouth every 8 (eight) hours as needed for nausea or vomiting., Disp: 60 tablet, Rfl: 2 .  pantoprazole (PROTONIX) 40 MG tablet, Take 1 tablet (40 mg total) by mouth daily., Disp: 30 tablet, Rfl: 6 .  promethazine (PHENERGAN) 25 MG suppository, Place 1 suppository (25 mg total)  rectally every 6 (six) hours as needed for nausea or vomiting., Disp: 60 each, Rfl: 3 .  Sennosides-Docusate Sodium (SENNA-S PO), Take 1 tablet by mouth as needed., Disp: , Rfl:  .  Toremifene Citrate (FARESTON) 60 MG tablet, Take 1 tablet (60 mg total) by mouth daily., Disp: 30 tablet, Rfl: 6 .  traZODone (DESYREL) 100 MG tablet, TAKE 1 TO 2 TABLETS BY MOUTH AS NEEDED FOR SLEEP, Disp: 60 tablet, Rfl: 2 No current facility-administered medications for this visit.   Facility-Administered Medications Ordered in Other Visits:  .  HYDROmorphone (DILAUDID) injection 2 mg, 2 mg, Intravenous, Q4H PRN, [name], MD, 2 mg at 04/20/16 1233 .  Zoledronic Acid (ZOMETA) 4 mg IVPB, 4 mg, Intravenous, Once, Eliezer Bottom, NP  Allergies:  Allergies  Allergen Reactions  . Iohexol      Code: RASH, Desc: VERY STRONG FAMILY HX OF ANGIOEDEMA WHEN RECEIVING IV CONTRAST; PT HAS BEEN PREMEDICATED FOR OTHER CONTRASTED STUDIES(IN CATH. LAB)  KR, Onset Date: [id number]   . Prednisone Itching    Capillary beds bust  . Tetanus Toxoids Other (See Comments)    Ran a high fever for 48 hours  . Theophyllines Hives    Mental changes  . Versed [Midazolam] Other (See Comments)    Pt becomes violent    Past Medical History, Surgical history, Social history, and Family History were reviewed and updated.  Review of Systems: As above  Physical Exam:  weight is 194 lb 3.2 oz (88.1 kg). Bianca Kent oral temperature is 97.5 F (36.4 C). Bianca Kent blood pressure is 121/77 and Bianca Kent pulse is 86. Bianca Kent respiration is 20 and oxygen saturation is 99%.   Well-developed and well-nourished white female. Head and exam shows no ocular or oral lesions. Bianca Kent's able to move both eyes well. Bianca Kent has good extraocular muscle . There is no erythema or exudate from the left eye. There might be some slight fullness in the right axilla by do not detect any obvious adenopathy. I cannot detect any obvious right supraclavicular lymph nodes. Bianca Kent has  improved range of motion of the right shoulder. Left supraclavicular region is okay. No masses are noted. Lungs are clear. Cardiac exam regular rate and rhythm with no murmurs, rubs or bruits.. Abdomen is soft. Bianca Kent has good bowel sounds. There is no palpable liver or spleen tip. Back exam shows decreased tenderness over the lumbar and thoracic spine to palpation. Extremities shows no clubbing, cyanosis or edema. Bianca Kent has 4/5 strength in Bianca Kent legs. Skin exam shows improvement in the radiation dermatitis of the right breast and axilla. There is no open wound. There is some hyper pigmentation. Neurological exam is nonfocal.    Lab Results  Component  Value Date   WBC 5.3 10/19/2016   HGB 13.4 10/19/2016   HCT 39.5 10/19/2016   MCV 91 10/19/2016   PLT 89 (L) 10/19/2016     Chemistry      Component Value Date/Time   NA 146 (H) 10/19/2016 0907   NA 138 04/30/2015 1019   K 4.0 10/19/2016 0907   K 3.5 04/30/2015 1019   CL 106 10/19/2016 0907   CO2 28 10/19/2016 0907   CO2 29 04/30/2015 1019   BUN 12 10/19/2016 0907   BUN 9.7 04/30/2015 1019   CREATININE 0.8 10/19/2016 0907   CREATININE 0.8 04/30/2015 1019      Component Value Date/Time   CALCIUM 8.9 10/19/2016 0907   CALCIUM 9.7 04/30/2015 1019   ALKPHOS 140 (H) 10/19/2016 0907   ALKPHOS 257 (H) 04/30/2015 1019   AST 31 10/19/2016 0907   AST 57 (H) 04/30/2015 1019   ALT 29 10/19/2016 0907   ALT 41 04/30/2015 1019   BILITOT 0.60 10/19/2016 0907   BILITOT 0.60 04/30/2015 1019         Impression and Plan: Bianca Kent is 50 year old white female with triple positive metastatic breast cancer.  I still have Bianca Kent on Fareston. Bianca Kent has a much better quality of life from my point of view. I think Bianca Kent is able to do more. Bianca Kent has the chronic oxygen requirements from the right hemidiaphragm so this is going to limit what Bianca Kent can do.  I really think that stereotactic radiosurgery for the liver lesion is a fantastic way of tried to help Bianca Kent.  Since this really is only side of active disease, I don't think that we need to make any adjustments with Bianca Kent treatment with Fareston. Bianca Kent quality of life is doing so well. Bianca Kent is gaining weight. Bianca Kent really is much happier now.   I think if we were ever to do another protocol for Bianca Kent, then I would definitely consider a biopsy. I just don't have much faith in liquid biopsies as we really have not been helped by liquid biopsies in our patients.   Bianca Kent and Bianca Kent sister are very, very appreciative of the outstanding care that Bianca Kent is gotten from the nurses here in Bianca Kent office. They are so complementary. There are also incredibly excited that we do not have to move them back to [place].   I will plan to see Bianca Kent in another month.  [name], MD 12/28/201710:26 AM

## 2016-10-20 LAB — CANCER ANTIGEN 27.29: CA 27.29: 38.6 U/mL (ref 0.0–38.6)

## 2016-10-21 DIAGNOSIS — J9601 Acute respiratory failure with hypoxia: Secondary | ICD-10-CM | POA: Diagnosis not present

## 2016-10-21 LAB — URINE CULTURE

## 2016-10-25 ENCOUNTER — Ambulatory Visit
Admission: RE | Admit: 2016-10-25 | Discharge: 2016-10-25 | Disposition: A | Discharge status: Home / Self Care | Payer: 59 | Source: Ambulatory Visit | Attending: Radiation Oncology | Admitting: Radiation Oncology

## 2016-10-25 ENCOUNTER — Other Ambulatory Visit: Payer: Self-pay | Admitting: Family

## 2016-10-25 ENCOUNTER — Ambulatory Visit (HOSPITAL_BASED_OUTPATIENT_CLINIC_OR_DEPARTMENT_OTHER): Payer: 59

## 2016-10-25 VITALS — BP 128/79 | HR 96 | Temp 98.3°F | Resp 20

## 2016-10-25 DIAGNOSIS — C50911 Malignant neoplasm of unspecified site of right female breast: Secondary | ICD-10-CM

## 2016-10-25 DIAGNOSIS — Z95828 Presence of other vascular implants and grafts: Secondary | ICD-10-CM

## 2016-10-25 DIAGNOSIS — Z452 Encounter for adjustment and management of vascular access device: Secondary | ICD-10-CM | POA: Diagnosis not present

## 2016-10-25 DIAGNOSIS — B373 Candidiasis of vulva and vagina: Secondary | ICD-10-CM

## 2016-10-25 DIAGNOSIS — B3731 Acute candidiasis of vulva and vagina: Secondary | ICD-10-CM

## 2016-10-25 DIAGNOSIS — C787 Secondary malignant neoplasm of liver and intrahepatic bile duct: Secondary | ICD-10-CM

## 2016-10-25 DIAGNOSIS — C7931 Secondary malignant neoplasm of brain: Secondary | ICD-10-CM | POA: Diagnosis not present

## 2016-10-25 DIAGNOSIS — K7689 Other specified diseases of liver: Secondary | ICD-10-CM | POA: Diagnosis not present

## 2016-10-25 DIAGNOSIS — N3001 Acute cystitis with hematuria: Secondary | ICD-10-CM

## 2016-10-25 MED ORDER — FLUCONAZOLE 150 MG PO TABS: 150.0000 mg | ORAL_TABLET | Freq: Once | ORAL | 0 refills | Status: AC

## 2016-10-25 MED ORDER — GADOXETATE DISODIUM 0.25 MMOL/ML IV SOLN
9.0000 mL | Freq: Once | INTRAVENOUS | Status: AC | PRN
  Administered 2016-10-25: 9 mL via INTRAVENOUS

## 2016-10-25 MED ORDER — SULFAMETHOXAZOLE-TRIMETHOPRIM 800-160 MG PO TABS: 1.0000 | ORAL_TABLET | Freq: Two times a day (BID) | ORAL | 0 refills | Status: DC

## 2016-10-25 MED ORDER — HEPARIN SOD (PORK) LOCK FLUSH 100 UNIT/ML IV SOLN
500.0000 [IU] | Freq: Once | INTRAVENOUS | Status: AC
  Administered 2016-10-25: 500 [IU] via INTRAVENOUS
  Filled 2016-10-25: qty 5

## 2016-10-25 MED ORDER — SODIUM CHLORIDE 0.9% FLUSH
10.0000 mL | INTRAVENOUS | Status: DC | PRN
  Administered 2016-10-25: 10 mL via INTRAVENOUS
  Filled 2016-10-25: qty 10

## 2016-10-25 MED FILL — SULFAMETHOXAZOLE/TMP DS TAB: 800-160 | 5 days supply | Qty: 10 | Fill #0

## 2016-10-25 MED FILL — FLUCONAZOLE 150 MG TABLET: 150 | 1 days supply | Qty: 1 | Fill #0

## 2016-10-25 NOTE — Progress Notes (Signed)
Patient positive for UTI and culture grew klebsiella pneumonia, we will have her take bactrim DS BID for 5 days and Diflucan 150 mg PO once prophylactic to prevent vaginal candidiasis. She is in agreement with this.

## 2016-11-08 ENCOUNTER — Other Ambulatory Visit: Payer: Self-pay | Admitting: Radiation Oncology

## 2016-11-08 ENCOUNTER — Telehealth: Payer: Self-pay | Admitting: *Deleted

## 2016-11-08 DIAGNOSIS — C50911 Malignant neoplasm of unspecified site of right female breast: Secondary | ICD-10-CM

## 2016-11-08 NOTE — Telephone Encounter (Signed)
Returned call to Robin,patient's sister,  Asking about when to start radiation MRI done 10/25/16, showed Dr. Lisbeth Renshaw and he will put in order for  fiducial markers first ,then after that ct simulation and then radiation,  alled sister [name], she is requesting to call her instead of L[place]yl, who sleeps half the day and rarely answers her phone,thanked this RN for calling back so soon, awaiting status 11:04 AM

## 2016-11-08 NOTE — Telephone Encounter (Signed)
Dr. Kathlene Cote, I've placed an order for an US guided needle placement for fiducials in a liver metastasis that Dr. Lisbeth Renshaw will treat with SBRT, and I asked in the order that you be the provider to place them (if that's okay with you!).  Thanks, Bryson Ha

## 2016-11-13 ENCOUNTER — Telehealth: Payer: Self-pay | Admitting: Radiation Oncology

## 2016-11-13 NOTE — Telephone Encounter (Signed)
I called and spoke with the patient about getting her scheduled for simulation. She has fiducial marker placement in the liver on 11/20/16 with IR. Sim scheduled for 11/23/16 at 10 am.

## 2016-11-16 ENCOUNTER — Other Ambulatory Visit: Payer: Self-pay | Admitting: Radiology

## 2016-11-16 ENCOUNTER — Telehealth: Payer: Self-pay | Admitting: *Deleted

## 2016-11-16 DIAGNOSIS — R52 Pain, unspecified: Secondary | ICD-10-CM | POA: Diagnosis not present

## 2016-11-16 MED ORDER — SERTRALINE HCL 100 MG PO TABS: 100.0000 mg | ORAL_TABLET | Freq: Every day | ORAL | 6 refills | Status: AC

## 2016-11-16 MED FILL — SERTRALINE HCL 100 MG TAB: 100 | 30 days supply | Qty: 30 | Fill #0

## 2016-11-16 NOTE — Telephone Encounter (Signed)
Received call from Palliative Care Team. They would like patient to be prescribed an antidepressant. Called and spoke to patient. She agree to try Zoloft per Dr Antonieta Pert suggestion.

## 2016-11-20 ENCOUNTER — Ambulatory Visit (HOSPITAL_COMMUNITY)
Admission: RE | Admit: 2016-11-20 | Discharge: 2016-11-20 | Disposition: A | Discharge status: Home / Self Care | Payer: 59 | Source: Ambulatory Visit | Attending: Radiation Oncology | Admitting: Radiation Oncology

## 2016-11-20 ENCOUNTER — Encounter (HOSPITAL_COMMUNITY): Payer: Self-pay

## 2016-11-20 DIAGNOSIS — G4730 Sleep apnea, unspecified: Secondary | ICD-10-CM | POA: Diagnosis not present

## 2016-11-20 DIAGNOSIS — C787 Secondary malignant neoplasm of liver and intrahepatic bile duct: Secondary | ICD-10-CM | POA: Insufficient documentation

## 2016-11-20 DIAGNOSIS — Z7982 Long term (current) use of aspirin: Secondary | ICD-10-CM | POA: Diagnosis not present

## 2016-11-20 DIAGNOSIS — Z9049 Acquired absence of other specified parts of digestive tract: Secondary | ICD-10-CM | POA: Insufficient documentation

## 2016-11-20 DIAGNOSIS — K909 Intestinal malabsorption, unspecified: Secondary | ICD-10-CM | POA: Diagnosis not present

## 2016-11-20 DIAGNOSIS — R161 Splenomegaly, not elsewhere classified: Secondary | ICD-10-CM | POA: Diagnosis not present

## 2016-11-20 DIAGNOSIS — Z87891 Personal history of nicotine dependence: Secondary | ICD-10-CM | POA: Diagnosis not present

## 2016-11-20 DIAGNOSIS — K219 Gastro-esophageal reflux disease without esophagitis: Secondary | ICD-10-CM | POA: Diagnosis not present

## 2016-11-20 DIAGNOSIS — I10 Essential (primary) hypertension: Secondary | ICD-10-CM | POA: Insufficient documentation

## 2016-11-20 DIAGNOSIS — J45909 Unspecified asthma, uncomplicated: Secondary | ICD-10-CM | POA: Insufficient documentation

## 2016-11-20 DIAGNOSIS — K7689 Other specified diseases of liver: Secondary | ICD-10-CM | POA: Diagnosis not present

## 2016-11-20 DIAGNOSIS — E669 Obesity, unspecified: Secondary | ICD-10-CM | POA: Insufficient documentation

## 2016-11-20 DIAGNOSIS — C50919 Malignant neoplasm of unspecified site of unspecified female breast: Secondary | ICD-10-CM | POA: Insufficient documentation

## 2016-11-20 DIAGNOSIS — C50911 Malignant neoplasm of unspecified site of right female breast: Secondary | ICD-10-CM

## 2016-11-20 LAB — CBC WITH DIFFERENTIAL/PLATELET
BASOS ABS: 0 10*3/uL (ref 0.0–0.1)
BASOS PCT: 0 %
EOS ABS: 0.1 10*3/uL (ref 0.0–0.7)
Eosinophils Relative: 2 %
HCT: 37.3 % (ref 36.0–46.0)
Hemoglobin: 12.6 g/dL (ref 12.0–15.0)
Lymphocytes Relative: 19 %
Lymphs Abs: 0.9 10*3/uL (ref 0.7–4.0)
MCH: 29.6 pg (ref 26.0–34.0)
MCHC: 33.8 g/dL (ref 30.0–36.0)
MCV: 87.6 fL (ref 78.0–100.0)
MONO ABS: 0.4 10*3/uL (ref 0.1–1.0)
Monocytes Relative: 7 %
Neutro Abs: 3.5 10*3/uL (ref 1.7–7.7)
Neutrophils Relative %: 71 %
PLATELETS: 82 10*3/uL — AB (ref 150–400)
RBC: 4.26 MIL/uL (ref 3.87–5.11)
RDW: 13.7 % (ref 11.5–15.5)
WBC: 4.9 10*3/uL (ref 4.0–10.5)

## 2016-11-20 LAB — COMPREHENSIVE METABOLIC PANEL
ALBUMIN: 3.8 g/dL (ref 3.5–5.0)
ALT: 34 U/L (ref 14–54)
AST: 30 U/L (ref 15–41)
Alkaline Phosphatase: 138 U/L — ABNORMAL HIGH (ref 38–126)
Anion gap: 11 (ref 5–15)
BUN: 10 mg/dL (ref 6–20)
CHLORIDE: 105 mmol/L (ref 101–111)
CO2: 25 mmol/L (ref 22–32)
Calcium: 8.9 mg/dL (ref 8.9–10.3)
Creatinine, Ser: 0.68 mg/dL (ref 0.44–1.00)
GFR calc Af Amer: >60 mL/min (ref >60)
Glucose, Bld: 115 mg/dL — ABNORMAL HIGH (ref 65–99)
POTASSIUM: 3.5 mmol/L (ref 3.5–5.1)
SODIUM: 141 mmol/L (ref 135–145)
Total Bilirubin: 0.5 mg/dL (ref 0.3–1.2)
Total Protein: 7.7 g/dL (ref 6.5–8.1)

## 2016-11-20 LAB — PROTIME-INR
INR: 0.97
PROTHROMBIN TIME: 12.9 s (ref 11.4–15.2)

## 2016-11-20 MED ORDER — DIAZEPAM 5 MG/ML IJ SOLN: 30.0000 mg | Freq: Once | INTRAMUSCULAR | Status: DC

## 2016-11-20 MED ORDER — FENTANYL CITRATE (PF) 100 MCG/2ML IJ SOLN
INTRAMUSCULAR | Status: AC
  Filled 2016-11-20: qty 4

## 2016-11-20 MED ORDER — SODIUM CHLORIDE 0.9 % IV SOLN
INTRAVENOUS | Status: DC
  Administered 2016-11-20: 12:00:00 via INTRAVENOUS

## 2016-11-20 MED ORDER — FENTANYL CITRATE (PF) 100 MCG/2ML IJ SOLN
INTRAMUSCULAR | Status: AC | PRN
  Administered 2016-11-20 (×3): 50 ug via INTRAVENOUS

## 2016-11-20 MED ORDER — MIDAZOLAM HCL 2 MG/2ML IJ SOLN
INTRAMUSCULAR | Status: AC
  Filled 2016-11-20: qty 4

## 2016-11-20 MED ORDER — LORAZEPAM 2 MG/ML IJ SOLN
6.0000 mg | Freq: Once | INTRAMUSCULAR | Status: DC
Start: 2016-11-20 — End: 2016-11-21
  Filled 2016-11-20: qty 3

## 2016-11-20 MED ORDER — HYDROCODONE-ACETAMINOPHEN 5-325 MG PO TABS: 1.0000 | ORAL_TABLET | ORAL | Status: DC | PRN

## 2016-11-20 MED ORDER — LORAZEPAM 2 MG/ML IJ SOLN
INTRAMUSCULAR | Status: AC | PRN
  Administered 2016-11-20 (×2): 1 mg via INTRAVENOUS

## 2016-11-20 MED ORDER — HEPARIN SOD (PORK) LOCK FLUSH 100 UNIT/ML IV SOLN
500.0000 [IU] | INTRAVENOUS | Status: AC | PRN
  Administered 2016-11-20: 500 [IU]
  Filled 2016-11-20: qty 5

## 2016-11-20 NOTE — Sedation Documentation (Signed)
Ativan 2mg  wasted, witnessed by Anell Barr in IR

## 2016-11-20 NOTE — Procedures (Signed)
Interventional Radiology Procedure Note  Procedure:  US guided placement of fiducial markers in liver  Complications: None  Estimated Blood Loss: < 10 mL  3 separate VisiCoil markers placed along superior, inferior and anterior margins of discrete liver lesion.  Venetia Night. Kathlene Cote, M.D Pager:  [phone]

## 2016-11-20 NOTE — Discharge Instructions (Signed)
Liver Markers, Care After Leave bandaid in place for 24 hours, then you may shower. Follow these instructions at home:  Rest at home for 1-2 days or as told by your doctor.  Have someone stay with you for at least 24 hours.  Do not do these things in the first 24 hours:  Drive.  Use machinery.  Take care of other people.  Sign legal documents.  Take a bath or shower  Do not drink alcohol in the first week.  Do not lift more than 5 pounds or play contact sports for the first 2 weeks.  Take medicines only as told by your doctor. For 1 week, do not take medicine that has aspirin in it or medicines like ibuprofen.  Contact a doctor if:  A puncture site bleeds and leaves more than just a small spot of blood.  A puncture site is red, puffs up (swells), or hurts more than before.  Fluid or something else comes from a puncture site.  A puncture site smells bad.  You have a fever or chills. Get help right away if:  You have swelling, bloating, or pain in your belly (abdomen).  You get dizzy or faint.  You have a rash.  You feel sick to your stomach (nauseous) or throw up (vomit).  You have trouble breathing, feel short of breath, or feel faint.  Your chest hurts.  You have problems talking or seeing.  You have trouble balancing or moving your arms or legs. This information is not intended to replace advice given to you by your health care provider. Make sure you discuss any questions you have with your health care provider. Document Released: 07/18/2008 Document Revised: 03/16/2016 Document Reviewed: 12/05/2013  2017 Elsevier Moderate Conscious Sedation, Adult, Care After These instructions provide you with information about caring for yourself after your procedure. Your health care provider may also give you more specific instructions. Your treatment has been planned according to current medical practices, but problems sometimes occur. Call your health care provider  if you have any problems or questions after your procedure. What can I expect after the procedure? After your procedure, it is common:  To feel sleepy for several hours.  To feel clumsy and have poor balance for several hours.  To have poor judgment for several hours.  To vomit if you eat too soon. Follow these instructions at home: For at least 24 hours after the procedure:   Do not:  Participate in activities where you could fall or become injured.  Drive.  Use heavy machinery.  Drink alcohol.  Take sleeping pills or medicines that cause drowsiness.  Make important decisions or sign legal documents.  Take care of children on your own.  Rest. Eating and drinking  Follow the diet recommended by your health care provider.  If you vomit:  Drink water, juice, or soup when you can drink without vomiting.  Make sure you have little or no nausea before eating solid foods. General instructions  Have a responsible adult stay with you until you are awake and alert.  Take over-the-counter and prescription medicines only as told by your health care provider.  If you smoke, do not smoke without supervision.  Keep all follow-up visits as told by your health care provider. This is important. Contact a health care provider if:  You keep feeling nauseous or you keep vomiting.  You feel light-headed.  You develop a rash.  You have a fever. Get help right away if:  You have trouble breathing. This information is not intended to replace advice given to you by your health care provider. Make sure you discuss any questions you have with your health care provider. Document Released: 07/30/2013 Document Revised: 03/13/2016 Document Reviewed: 01/29/2016 Elsevier Interactive Patient Education  2017 [employer].

## 2016-11-20 NOTE — Consult Note (Signed)
Chief Complaint: Patient was seen in consultation today for US guided fiducial markers within liver metastasis  Referring Physician(s): Week[place] Claire/Moody,J  Supervising Physician: Aletta Edouard  Patient Status: [facility] - Out-pt  History of Present Illness: Bianca Kent is a 51 y.o. female with history of metastatic breast cancer, currently undergoing chemoradiation. Recent MRI of the abdomen on 10/25/16 has revealed stable segment 7 liver lesion consistent with known metastasis. She presents today for placement of fiducial markers prior to planned stereotactic radiosurgery.  Past Medical History:  Diagnosis Date  . Allergy   . Arthritis    Osteo arthritis  . Asthma   . Breast cancer ([place]) 02/2014   right  . Cancer [facility])    right breast & lymph nodes  . 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 Family history of anesthesia complication    Mother and sisters has angioedema  . GERD (gastroesophageal reflux disease)   . History of radiation therapy 12/16/14   Left temporal tumor, SRS treatment  . Hypertension    pt reports that while taking medication for coronary spasms caused elevated blood pressure. Pt no longer takes meds for coronary spasms or HTN.  . Malabsorption of iron 11/11/2015  . Metastases to the liver ([place]) 09/28/2016   from Pet scan  . Metastasis from breast cancer ([place]) 11/26/14   MRI Brain  . 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 Pneumonia    hx of  . PONV (postoperative nausea and vomiting)    agitated  . Radiation 08/06/14-09/16/14   right breast, axillary, supraclavicular region   . Shortness of breath dyspnea    "side effect of Chemo"  . Sleep apnea    Does not use machine patient stated "its not bad enough for that"  . UTI (lower urinary tract infection)    Due to small ureters    Past Surgical History:  Procedure Laterality Date  . BREAST BIOPSY WITH SENTINEL LYMPH NODE BIOPSY AND NEEDLE LOCALIZATION Right   . CARDIAC  CATHETERIZATION  X 3   no PCI  . CHOLECYSTECTOMY    . COLONOSCOPY W/ POLYPECTOMY  2008  . KNEE SURGERY Right    arthroscopy- cleaned menicus  . PORTACATH PLACEMENT Left 03/26/2014   Procedure: INSERTION PORT-A-CATH;  Surgeon: Rolm Bookbinder, MD;  Location: [place];  Service: General;  Laterality: Left;  . SHOULDER ARTHROSCOPY Right 11/16/2015   Procedure: ARTHROSCOPY SHOULDER, Accromioplasty, and debridement;  Surgeon: Melrose Nakayama, MD;  Location: [place];  Service: Orthopedics;  Laterality: Right;  . SINUS ENDO W/FUSION Bilateral 03/03/2015   Procedure: ENDOSCOPIC SINUS SURGERY WITH NAVIGATION;  Surgeon: [name], MD;  Location: [place];  Service: ENT;  Laterality: Bilateral;  . SINUS SURGERY WITH INSTATRAK     without instatrak  . SINUSOTOMY  03/03/2015  . TENDON REPAIR Left    index finger  . TONSILLECTOMY    . TUBAL LIGATION    . WISDOM TOOTH EXTRACTION      Allergies: Iohexol; Prednisone; Tetanus toxoids; Theophyllines; and Versed [midazolam]  Medications: Prior to Admission medications   Medication Sig Start Date End Date Taking? Authorizing Provider  albuterol (PROVENTIL HFA;VENTOLIN HFA) 108 (90 BASE) MCG/ACT inhaler Inhale 2 puffs into the lungs every 4 (four) hours as needed for wheezing or shortness of breath. 05/14/14   [name], MD  ALPRAZolam Duanne Moron) 1 MG tablet Take 1 tablet (1 mg total) by mouth 2 (two) times daily as needed for anxiety. 06/29/16  [name], MD  aspirin (ASPIRIN EC) 81 MG EC tablet Take 81 mg by mouth daily. Swallow whole.    Historical Provider, MD  diphenhydrAMINE (SOMINEX) 25 MG tablet Take 100 mg by mouth at bedtime as needed for sleep. Pt states she is taking up to 200 mg Benadryl at night and still not sleeping    Historical Provider, MD  dronabinol (MARINOL) 10 MG capsule Take 1 capsule (10 mg total) by mouth 3 (three) times daily before meals. 12/02/15   [name], MD  ergocalciferol (VITAMIN D2) 50000 units capsule Take 1  capsule (50,000 Units total) by mouth 2 (two) times a week. 06/01/16   [name], MD  fentaNYL (DURAGESIC - DOSED MCG/HR) 100 MCG/HR Place 1 patch (100 mcg total) onto the skin every other day. 08/31/16   [name], MD  HYDROmorphone (DILAUDID) 4 MG tablet Take 1 tablet (4 mg total) by mouth every 4 (four) hours as needed for severe pain. 08/31/16   [name], MD  Lactulose SOLN Take 2 TBLSP every 6 hours until bowel movement 12/17/14   [name], MD  lidocaine-prilocaine (EMLA) cream Apply 1 application topically as needed. 01/13/16   [name], MD  loratadine-pseudoephedrine (CLARITIN-D 24-HOUR) 10-240 MG per 24 hr tablet Take 1 tablet by mouth daily.    Historical Provider, MD  methylphenidate (RITALIN) 10 MG tablet Take 1 tablet (10 mg total) by mouth 2 (two) times daily. 05/11/16   [name], MD  mirtazapine (REMERON) 30 MG tablet Take 1 tablet (30 mg total) by mouth at bedtime. 06/29/16   [name], MD  Multiple Vitamin (MULTIVITAMIN) tablet Take 1 tablet by mouth daily.    Historical Provider, MD  Nystatin POWD 1 application by Does not apply route 2 (two) times daily. 12/23/15   Eliezer Bottom, NP  ondansetron (ZOFRAN) 8 MG tablet Take 1 tablet (8 mg total) by mouth every 8 (eight) hours as needed for nausea or vomiting. 06/29/16   [name], MD  pantoprazole (PROTONIX) 40 MG tablet Take 1 tablet (40 mg total) by mouth daily. 05/11/16   [name], MD  promethazine (PHENERGAN) 25 MG suppository Place 1 suppository (25 mg total) rectally every 6 (six) hours as needed for nausea or vomiting. 08/13/15   [name], MD  Sennosides-Docusate Sodium (SENNA-S PO) Take 1 tablet by mouth as needed.    Historical Provider, MD  sertraline (ZOLOFT) 100 MG tablet Take 1 tablet (100 mg total) by mouth daily. 11/16/16   [name], MD  sulfamethoxazole-trimethoprim (BACTRIM DS,SEPTRA DS) 800-160 MG tablet Take 1 tablet by mouth 2 (two) times daily. 10/25/16    Eliezer Bottom, NP  Toremifene Citrate (FARESTON) 60 MG tablet Take 1 tablet (60 mg total) by mouth daily. 07/20/16   [name], MD  traZODone (DESYREL) 100 MG tablet TAKE 1 TO 2 TABLETS BY MOUTH AS NEEDED FOR SLEEP 06/01/16   [name], MD     Family History  Problem Relation Age of Onset  . Coronary artery disease Mother   . Diabetes Mother   . Hypertension Mother   . Coronary artery disease Father   . Hypertension Father   . Hypertension Sister   . Coronary artery disease Maternal Grandmother   . Stroke Maternal Grandmother   . Depression Maternal Grandmother   . Cancer Maternal Grandmother     colon  . Coronary artery disease Maternal Grandfather   .  Stroke Maternal Grandfather   . Depression Maternal Grandfather   . Coronary artery disease Paternal Grandmother   . Cancer Paternal Grandmother   . Cancer Paternal Grandfather     lung  . Hypertension Sister   . Cancer Paternal Aunt     breast    Social History   Social History  . Marital status: Single    Spouse name: N/A  . Number of children: 0  . Years of education: N/A   Occupational History  . RN [employer]   Social History Main Topics  . Smoking status: Former Smoker    Packs/day: 0.50    Years: 24.00    Types: Cigarettes    Start date: 09/23/1984    Quit date: 10/22/2007  . Smokeless tobacco: Never Used     Comment: QUIT 7 YEARS AGO  . Alcohol use 0.0 oz/week     Comment: rare - 1-3 drinks a month- glass of wine  . Drug use: No  . Sexual activity: Yes    Birth control/ protection: Condom   Other Topics Concern  . Not on file   Social History Narrative  . No narrative on file      Review of Systems currently denies fever, headache, chest pain, cough, abdominal pain, nausea, vomiting or abnormal bleeding. She does have some exertional dyspnea as well as intermittent back pain  Vital Signs: LMP 06/09/2014   Physical Exam awake, alert. Chest with slightly diminished breath  sounds right base, left clear. Clean, intact left chest wall Port-A-Cath. Heart with regular rate and rhythm. Abdomen obese, soft, positive bowel sounds, nontender. Lower extremities with no edema.  Mallampati Score:     Imaging: Mr Abdomen W Wo Contrast  Result Date: 10/25/2016 CLINICAL DATA:  Evaluate liver lesions.  History of breast cancer. EXAM: MRI ABDOMEN WITHOUT AND WITH CONTRAST TECHNIQUE: Multiplanar multisequence MR imaging of the abdomen was performed both before and after the administration of intravenous contrast. CONTRAST:  9 cc Eovist COMPARISON:  PET-CT 09/28/2016 FINDINGS: Lower chest: The lung bases are grossly clear. No worrisome pulmonary lesions. Moderate eventration of the right hemidiaphragm with overlying vascular crowding atelectasis. No pleural or pericardial effusion. Hepatobiliary: Stable 2.4 cm segment 7 liver lesion consistent with known metastasis. This was hypermetabolic on the recent prior PET-CT. There was a questionable new lesion in segment 2 on the PET scan. However, I do not see any correlating abnormality on the MRI. No new hepatic lesions. Status post cholecystectomy with stable mild intra and extrahepatic biliary dilatation. Pancreas:  No mass, inflammation or ductal dilatation. Spleen:  Stable splenomegaly.  No focal lesions. Adrenals/Urinary Tract: The adrenal glands and kidneys are unremarkable and stable. Stomach/Bowel: Visualized portions within the abdomen are unremarkable. Vascular/Lymphatic: No pathologically enlarged lymph nodes identified. No abdominal aortic aneurysm demonstrated. Other:  No ascites or abdominal wall hernia. Musculoskeletal: No significant bony findings. IMPRESSION: 1. Stable segment 7 liver lesion consistent with known metastasis. No other hepatic lesions are identified to suggest new metastatic disease. 2. Status post cholecystectomy with mild stable intra and extrahepatic biliary dilatation. 3. No abdominal lymphadenopathy. 4. Stable  mild splenomegaly. Electronically Signed   By: Marijo Sanes M.D.   On: 10/25/2016 10:54    Labs:  CBC:  Recent Labs  06/29/16 1054 07/20/16 0741 08/31/16 0836 10/19/16 0907  WBC 5.3 4.8 4.5 5.3  HGB 13.5 13.7 12.7 13.4  HCT 39.7 40.8 38.6 39.5  PLT 92* 104* 98* 89*    COAGS: No results for input(s):  INR, APTT in the last 8760 hours.  BMP:  Recent Labs  06/29/16 1052 07/20/16 0741 08/31/16 0836 10/19/16 0907  NA 137 138 140 146*  K 3.8 4.1 4.1 4.0  CL 103 99 102 106  CO2 32 30 28 28   GLUCOSE 104 102 107 115  BUN 5* 10 12 12   CALCIUM 8.9 9.3 8.8 8.9  CREATININE 0.8 0.8 0.9 0.8    LIVER FUNCTION TESTS:  Recent Labs  06/29/16 1052 07/20/16 0741 08/31/16 0836 10/19/16 0907  BILITOT 0.70 0.80 0.50 0.60  AST 28 54* 28 31  ALT 30 56* 29 29  ALKPHOS 127* 180* 148* 140*  PROT 7.5 7.8 7.1 7.6  ALBUMIN 3.6 3.6 3.4 3.9    TUMOR MARKERS: No results for input(s): AFPTM, CEA, CA199, CHROMGRNA in the last 8760 hours.  Assessment and Plan:  51 y.o. female with history of metastatic breast cancer, currently undergoing chemoradiation. Recent MRI of the abdomen on 10/25/16 has revealed stable segment 7 liver lesion consistent with known metastasis. She presents today for placement of fiducial markers prior to planned stereotactic radiosurgery. Details/risks of procedure, including but not limited to, internal bleeding, infection, injury to adjacent structures, discussed with patient and family with their understanding and consent. Labs pending.    Thank you for this interesting consult.  I greatly enjoyed meeting RHYLI FERRERAS and look forward to participating in their care.  A copy of this report was sent to the requesting provider on this date.  Electronically Signed: D. Rowe Robert 11/20/2016, 11:21 AM   I spent a total of 25 minutes in face to face in clinical consultation, greater than 50% of which was counseling/coordinating care for ultrasound-guided placement  of fiducial markers into liver metastasis

## 2016-11-21 DIAGNOSIS — J9601 Acute respiratory failure with hypoxia: Secondary | ICD-10-CM | POA: Diagnosis not present

## 2016-11-23 ENCOUNTER — Ambulatory Visit
Admission: RE | Admit: 2016-11-23 | Discharge: 2016-11-23 | Disposition: A | Discharge status: Home / Self Care | Payer: 59 | Source: Ambulatory Visit | Attending: Radiation Oncology | Admitting: Radiation Oncology

## 2016-11-23 DIAGNOSIS — C787 Secondary malignant neoplasm of liver and intrahepatic bile duct: Secondary | ICD-10-CM | POA: Diagnosis not present

## 2016-11-23 DIAGNOSIS — Z7982 Long term (current) use of aspirin: Secondary | ICD-10-CM | POA: Diagnosis not present

## 2016-11-23 DIAGNOSIS — Z79899 Other long term (current) drug therapy: Secondary | ICD-10-CM | POA: Diagnosis not present

## 2016-11-23 DIAGNOSIS — C50911 Malignant neoplasm of unspecified site of right female breast: Secondary | ICD-10-CM | POA: Diagnosis not present

## 2016-11-23 DIAGNOSIS — Z510 Encounter for antineoplastic radiation therapy: Secondary | ICD-10-CM | POA: Diagnosis not present

## 2016-11-23 DIAGNOSIS — Z170 Estrogen receptor positive status [ER+]: Secondary | ICD-10-CM | POA: Diagnosis not present

## 2016-11-24 NOTE — Progress Notes (Signed)
[facility] Radiation Oncology Simulation and Treatment Planning Note   Name:  Bianca [facility] MRN: [id number]   Date: 11/24/2016  DOB: 1966-10-02  Status:outpatient    DIAGNOSIS:    ICD-9-CM ICD-10-CM   1. Liver metastasis ([place]) 197.7 C78.7      CONSENT VERIFIED:yes   SET UP: Patient is setup supine   IMMOBILIZATION: The patient was immobilized using a Vac Loc bag and Abdominal Compression.   NARRATIVE:The patient was brought to the [facility].  Identity was confirmed.  All relevant records and images related to the planned course of therapy were reviewed.  Then, the patient was positioned in a stable reproducible clinical set-up for radiation therapy. Abdominal compression was applied by me.  4D CT images were obtained and reproducible breathing pattern was confirmed. Free breathing CT images were obtained.  Skin markings were placed.  The CT images were loaded into the planning software where the target and avoidance structures were contoured.  The radiation prescription was entered and confirmed.    TREATMENT PLANNING NOTE:  Treatment planning then occurred. I have requested : MLC's, isodose plan, basic dose calculation.  3 dimensional simulation is performed and dose volume histogram of the Kent tumor volume, planning tumor volume and criticial normal structures including the spinal cord and lungs were analyzed and requested.  Special treatment procedure was performed due to high dose per fraction.  The patient will be monitored for increased risk of toxicity.  Daily imaging using cone beam CT will be used for target localization.  I anticipate that the patient will receive 54 Gy in 3 fractions to target volume. Further adjustments will be made based on the planning process is necessary.  ------------------------------------------------  Bianca Gross, MD, PhD

## 2016-11-24 NOTE — Progress Notes (Signed)
  Radiation Oncology         [phone] ________________________________  Name: Bianca [facility] MRN: [id number]  Date: 11/23/2016  DOB: May 11, 1966  RESPIRATORY MOTION MANAGEMENT SIMULATION  NARRATIVE:  In order to account for effect of respiratory motion on target structures and other organs in the planning and delivery of radiotherapy, this patient underwent respiratory motion management simulation.  To accomplish this, when the patient was brought to the CT simulation planning suite, 4D respiratoy motion management CT images were obtained.  The CT images were loaded into the planning software.  Then, using a variety of tools including Cine, MIP, and standard views, the target volume and planning target volumes (PTV) were delineated.  Avoidance structures were contoured.  Treatment planning then occurred.  Dose volume histograms were generated and reviewed for each of the requested structure.  The resulting plan was carefully reviewed and approved today.  ------------------------------------------------  Jodelle Gross, MD, PhD

## 2016-11-29 ENCOUNTER — Other Ambulatory Visit: Payer: Self-pay | Admitting: Hematology & Oncology

## 2016-11-29 MED FILL — ALPRAZolam 1 MG TABS: 1 | 30 days supply | Qty: 60 | Fill #2

## 2016-11-29 MED FILL — MIRTAZAPINE 30 MG TABLET: 30 | 30 days supply | Qty: 30 | Fill #0

## 2016-11-29 MED FILL — FARESTON 60 MG TABLET: 60 | 30 days supply | Qty: 30 | Fill #3

## 2016-11-30 ENCOUNTER — Ambulatory Visit: Payer: 59

## 2016-11-30 ENCOUNTER — Ambulatory Visit (HOSPITAL_BASED_OUTPATIENT_CLINIC_OR_DEPARTMENT_OTHER): Payer: 59

## 2016-11-30 ENCOUNTER — Ambulatory Visit (HOSPITAL_BASED_OUTPATIENT_CLINIC_OR_DEPARTMENT_OTHER): Payer: 59 | Admitting: Hematology & Oncology

## 2016-11-30 ENCOUNTER — Other Ambulatory Visit: Payer: 59

## 2016-11-30 VITALS — BP 133/82 | HR 82 | Temp 98.0°F | Wt 203.4 lb

## 2016-11-30 DIAGNOSIS — C50911 Malignant neoplasm of unspecified site of right female breast: Secondary | ICD-10-CM

## 2016-11-30 DIAGNOSIS — Z170 Estrogen receptor positive status [ER+]: Secondary | ICD-10-CM

## 2016-11-30 DIAGNOSIS — C7931 Secondary malignant neoplasm of brain: Secondary | ICD-10-CM | POA: Diagnosis not present

## 2016-11-30 DIAGNOSIS — Z9221 Personal history of antineoplastic chemotherapy: Secondary | ICD-10-CM

## 2016-11-30 DIAGNOSIS — C50919 Malignant neoplasm of unspecified site of unspecified female breast: Secondary | ICD-10-CM

## 2016-11-30 DIAGNOSIS — A049 Bacterial intestinal infection, unspecified: Secondary | ICD-10-CM | POA: Diagnosis not present

## 2016-11-30 DIAGNOSIS — Z510 Encounter for antineoplastic radiation therapy: Secondary | ICD-10-CM | POA: Diagnosis not present

## 2016-11-30 DIAGNOSIS — C50912 Malignant neoplasm of unspecified site of left female breast: Secondary | ICD-10-CM

## 2016-11-30 DIAGNOSIS — Z7982 Long term (current) use of aspirin: Secondary | ICD-10-CM | POA: Diagnosis not present

## 2016-11-30 DIAGNOSIS — Z95828 Presence of other vascular implants and grafts: Secondary | ICD-10-CM

## 2016-11-30 DIAGNOSIS — Z79899 Other long term (current) drug therapy: Secondary | ICD-10-CM | POA: Diagnosis not present

## 2016-11-30 DIAGNOSIS — C787 Secondary malignant neoplasm of liver and intrahepatic bile duct: Secondary | ICD-10-CM | POA: Diagnosis not present

## 2016-11-30 DIAGNOSIS — R300 Dysuria: Secondary | ICD-10-CM

## 2016-11-30 DIAGNOSIS — M810 Age-related osteoporosis without current pathological fracture: Secondary | ICD-10-CM

## 2016-11-30 LAB — CMP (CANCER CENTER ONLY)
ALBUMIN: 3.7 g/dL (ref 3.3–5.5)
ALT(SGPT): 28 U/L (ref 10–47)
AST: 28 U/L (ref 11–38)
Alkaline Phosphatase: 165 U/L — ABNORMAL HIGH (ref 26–84)
BILIRUBIN TOTAL: 0.6 mg/dL (ref 0.20–1.60)
BUN, Bld: 10 mg/dL (ref 7–22)
CALCIUM: 9.2 mg/dL (ref 8.0–10.3)
CHLORIDE: 105 meq/L (ref 98–108)
CO2: 30 meq/L (ref 18–33)
Creat: 1 mg/dl (ref 0.6–1.2)
GLUCOSE: 127 mg/dL — AB (ref 73–118)
POTASSIUM: 3.3 meq/L (ref 3.3–4.7)
Sodium: 145 mEq/L (ref 128–145)
Total Protein: 7.6 g/dL (ref 6.4–8.1)

## 2016-11-30 LAB — CBC WITH DIFFERENTIAL (CANCER CENTER ONLY)
BASO#: 0 10*3/uL (ref 0.0–0.2)
BASO%: 0.4 % (ref 0.0–2.0)
EOS%: 2 % (ref 0.0–7.0)
Eosinophils Absolute: 0.1 10*3/uL (ref 0.0–0.5)
HEMATOCRIT: 38.6 % (ref 34.8–46.6)
HEMOGLOBIN: 12.9 g/dL (ref 11.6–15.9)
LYMPH#: 0.8 10*3/uL — AB (ref 0.9–3.3)
LYMPH%: 16.1 % (ref 14.0–48.0)
MCH: 30.6 pg (ref 26.0–34.0)
MCHC: 33.4 g/dL (ref 32.0–36.0)
MCV: 92 fL (ref 81–101)
MONO#: 0.4 10*3/uL (ref 0.1–0.9)
MONO%: 7.5 % (ref 0.0–13.0)
NEUT%: 74 % (ref 39.6–80.0)
NEUTROS ABS: 3.6 10*3/uL (ref 1.5–6.5)
Platelets: 105 10*3/uL — ABNORMAL LOW (ref 145–400)
RBC: 4.22 10*6/uL (ref 3.70–5.32)
RDW: 13.9 % (ref 11.1–15.7)
WBC: 4.9 10*3/uL (ref 3.9–10.0)

## 2016-11-30 MED ORDER — SODIUM CHLORIDE 0.9 % IJ SOLN
10.0000 mL | INTRAMUSCULAR | Status: DC | PRN
  Administered 2016-11-30: 10 mL via INTRAVENOUS
  Filled 2016-11-30: qty 10

## 2016-11-30 MED ORDER — ZOLEDRONIC ACID 4 MG/100ML IV SOLN
4.0000 mg | Freq: Once | INTRAVENOUS | Status: AC
  Administered 2016-11-30: 4 mg via INTRAVENOUS
  Filled 2016-11-30: qty 100

## 2016-11-30 MED ORDER — HEPARIN SOD (PORK) LOCK FLUSH 100 UNIT/ML IV SOLN
500.0000 [IU] | Freq: Once | INTRAVENOUS | Status: AC | PRN
  Administered 2016-11-30: 500 [IU] via INTRAVENOUS
  Filled 2016-11-30: qty 5

## 2016-11-30 NOTE — Patient Instructions (Signed)
Implanted Port Insertion, Care After Refer to this sheet in the next few weeks. These instructions provide you with information on caring for yourself after your procedure. Your health care provider may also give you more specific instructions. Your treatment has been planned according to current medical practices, but problems sometimes occur. Call your health care provider if you have any problems or questions after your procedure. WHAT TO EXPECT AFTER THE PROCEDURE After your procedure, it is typical to have the following:   Discomfort at the port insertion site. Ice packs to the area will help.  Bruising on the skin over the port. This will subside in 3-4 days. HOME CARE INSTRUCTIONS  After your port is placed, you will get a manufacturer's information card. The card has information about your port. Keep this card with you at all times.   Know what kind of port you have. There are many types of ports available.   Wear a medical alert bracelet in case of an emergency. This can help alert health care workers that you have a port.   The port can stay in for as long as your health care provider believes it is necessary.   A home health care nurse may give medicines and take care of the port.   You or a family member can get special training and directions for giving medicine and taking care of the port at home.  SEEK MEDICAL CARE IF:   Your port does not flush or you are unable to get a blood return.   You have a fever or chills. SEEK IMMEDIATE MEDICAL CARE IF:  You have new fluid or pus coming from your incision.   You notice a bad smell coming from your incision site.   You have swelling, pain, or more redness at the incision or port site.   You have chest pain or shortness of breath. This information is not intended to replace advice given to you by your health care provider. Make sure you discuss any questions you have with your health care provider. Document  Released: 07/30/2013 Document Revised: 10/14/2013 Document Reviewed: 07/30/2013 Elsevier Interactive Patient Education  2017 [employer]  

## 2016-11-30 NOTE — Patient Instructions (Signed)

## 2016-12-01 LAB — CANCER ANTIGEN 27.29: CA 27.29: 65.3 U/mL — ABNORMAL HIGH (ref 0.0–38.6)

## 2016-12-01 NOTE — Progress Notes (Signed)
Hematology and Oncology Follow Up Visit  Bianca Kent [id number] 12-30-1965 51 y.o. 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 She looks quite good. Her weight continues to go up which is actually nice to see. She is able to taste food. Her rash is not yet had the [facility] for the liver lesion. She has been simulated. I think this will start next week. She will have between 3-5 treatments.  Her CA 27.29 is going up slowly. Today, the CA 27.29 was up to 65.  We have had her off therapy for about 3 or 4 months. She is on Fareston. We may have to strongly consider getting her back on treatment. She is not been on Tykerb as of yet. We certainly could consider this. I want to try to avoid actual chemotherapy. I'm sure that we could use chemotherapy but we have to consider her quality of life.  She still wearing oxygen. Her pulmonary status really has not changed.  She has had no problems with bowels or bladder. She is on quite a few pain medications but she is trying to wean herself down a little bit.  She's had no rashes. She's had no leg swelling. She's had no headache.  Overall, her quality of life has been her main goal. She has definitely been able to have a better quality of life since being off all IV therapy.  Overall, her performance status is ECOG 1-2.   Medications:  Current Outpatient Prescriptions:  .  albuterol (PROVENTIL HFA;VENTOLIN HFA) 108 (90 BASE) MCG/ACT inhaler, Inhale 2 puffs into the lungs every 4 (four) hours as needed for wheezing or shortness of breath., Disp: 2 Inhaler, Rfl: 6 .  ALPRAZolam (XANAX) 1 MG  tablet, Take 1 tablet (1 mg total) by mouth 2 (two) times daily as needed for anxiety., Disp: 60 tablet, Rfl: 2 .  aspirin (ASPIRIN EC) 81 MG EC tablet, Take 81 mg by mouth daily. Swallow whole., Disp: , Rfl:  .  diphenhydrAMINE (SOMINEX) 25 MG tablet, Take 100 mg by mouth at bedtime as needed for sleep. Pt states she is taking up to 200 mg Benadryl at night and still not sleeping, Disp: , Rfl:  .  dronabinol (MARINOL) 10 MG capsule, Take 1 capsule (10 mg total) by mouth 3 (three) times daily before meals., Disp: 90 capsule, Rfl: 0 .  ergocalciferol (VITAMIN D2) 50000 units capsule, Take 1 capsule (50,000 Units total) by mouth 2 (two) times a week., Disp: 8 capsule, Rfl: 6 .  fentaNYL (DURAGESIC - DOSED MCG/HR) 100 MCG/HR, Place 1 patch (100 mcg total) onto the skin every other day., Disp: 15 patch, Rfl: 0 .  HYDROmorphone (DILAUDID) 4 MG tablet, Take 1 tablet (4 mg total) by mouth every 4 (four) hours as needed for severe pain., Disp: 90 tablet, Rfl: 0 .  Lactulose SOLN, Take 2 TBLSP every 6 hours until bowel movement, Disp: 1000 mL, Rfl: 4 .  lidocaine-prilocaine (EMLA) cream, Apply 1 application topically as needed., Disp: 30 g,  Rfl: 6 .  loratadine-pseudoephedrine (CLARITIN-D 24-HOUR) 10-240 MG per 24 hr tablet, Take 1 tablet by mouth daily., Disp: , Rfl:  .  methylphenidate (RITALIN) 10 MG tablet, Take 1 tablet (10 mg total) by mouth 2 (two) times daily., Disp: 60 tablet, Rfl: 0 .  mirtazapine (REMERON) 30 MG tablet, TAKE 1 TABLET (30 MG TOTAL) BY MOUTH AT BEDTIME., Disp: 30 tablet, Rfl: 3 .  Multiple Vitamin (MULTIVITAMIN) tablet, Take 1 tablet by mouth daily., Disp: , Rfl:  .  Nystatin POWD, 1 application by Does not apply route 2 (two) times daily., Disp: 1 Bottle, Rfl: 3 .  ondansetron (ZOFRAN) 8 MG tablet, Take 1 tablet (8 mg total) by mouth every 8 (eight) hours as needed for nausea or vomiting., Disp: 60 tablet, Rfl: 2 .  pantoprazole (PROTONIX) 40 MG tablet, Take 1 tablet (40 mg total) by  mouth daily., Disp: 30 tablet, Rfl: 6 .  promethazine (PHENERGAN) 25 MG suppository, Place 1 suppository (25 mg total) rectally every 6 (six) hours as needed for nausea or vomiting., Disp: 60 each, Rfl: 3 .  Sennosides-Docusate Sodium (SENNA-S PO), Take 1 tablet by mouth as needed., Disp: , Rfl:  .  sertraline (ZOLOFT) 100 MG tablet, Take 1 tablet (100 mg total) by mouth daily., Disp: 30 tablet, Rfl: 6 .  Toremifene Citrate (FARESTON) 60 MG tablet, Take 1 tablet (60 mg total) by mouth daily., Disp: 30 tablet, Rfl: 6 .  traZODone (DESYREL) 100 MG tablet, TAKE 1 TO 2 TABLETS BY MOUTH AS NEEDED FOR SLEEP, Disp: 60 tablet, Rfl: 2 No current facility-administered medications for this visit.   Facility-Administered Medications Ordered in Other Visits:  .  HYDROmorphone (DILAUDID) injection 2 mg, 2 mg, Intravenous, Q4H PRN, [name], MD, 2 mg at 04/20/16 1233  Allergies:  Allergies  Allergen Reactions  . Iohexol      Code: RASH, Desc: VERY STRONG FAMILY HX OF ANGIOEDEMA WHEN RECEIVING IV CONTRAST; PT HAS BEEN PREMEDICATED FOR OTHER CONTRASTED STUDIES(IN CATH. LAB)  KR, Onset Date: [id number]   . Prednisone Itching    Capillary beds bust  . Tetanus Toxoids Other (See Comments)    Ran a high fever for 48 hours  . Theophyllines Hives    Mental changes  . Versed [Midazolam] Other (See Comments)    Pt becomes violent    Past Medical History, Surgical history, Social history, and Family History were reviewed and updated.  Review of Systems: As above  Physical Exam:  weight is 203 lb 6.4 oz (92.3 kg). Her oral temperature is 98 F (36.7 C). Her blood pressure is 133/82 and her pulse is 82.   Well-developed and well-nourished white female. Head and exam shows no ocular or oral lesions. She's able to move both eyes well. She has good extraocular muscle . There is no erythema or exudate from the left eye. There might be some slight fullness in the right axilla by do not detect any obvious  adenopathy. I cannot detect any obvious right supraclavicular lymph nodes. She has improved range of motion of the right shoulder. Left supraclavicular region is okay. No masses are noted. Lungs are clear. Cardiac exam regular rate and rhythm with no murmurs, rubs or bruits.. Abdomen is soft. She has good bowel sounds. There is no palpable liver or spleen tip. Back exam shows decreased tenderness over the lumbar and thoracic spine to palpation. Extremities shows no clubbing, cyanosis or edema. She has 4/5 strength in her legs. Skin exam shows improvement in  the radiation dermatitis of the right breast and axilla. There is no open wound. There is some hyper pigmentation. Neurological exam is nonfocal.    Lab Results  Component Value Date   WBC 4.9 11/30/2016   HGB 12.9 11/30/2016   HCT 38.6 11/30/2016   MCV 92 11/30/2016   PLT 105 (L) 11/30/2016     Chemistry      Component Value Date/Time   NA 145 11/30/2016 0826   NA 138 04/30/2015 1019   K 3.3 11/30/2016 0826   K 3.5 04/30/2015 1019   CL 105 11/30/2016 0826   CO2 30 11/30/2016 0826   CO2 29 04/30/2015 1019   BUN 10 11/30/2016 0826   BUN 9.7 04/30/2015 1019   CREATININE 1.0 11/30/2016 0826   CREATININE 0.8 04/30/2015 1019      Component Value Date/Time   CALCIUM 9.2 11/30/2016 0826   CALCIUM 9.7 04/30/2015 1019   ALKPHOS 165 (H) 11/30/2016 0826   ALKPHOS 257 (H) 04/30/2015 1019   AST 28 11/30/2016 0826   AST 57 (H) 04/30/2015 1019   ALT 28 11/30/2016 0826   ALT 41 04/30/2015 1019   BILITOT 0.60 11/30/2016 0826   BILITOT 0.60 04/30/2015 1019         Impression and Plan: Bianca Kent is 51 year old white female with triple positive metastatic breast cancer.  I still have her on Fareston. She has a much better quality of life from my point of view. I think she is able to do more. She has the chronic oxygen requirements from the right hemidiaphragm so this is going to limit what she can do.  At some point, we probably are  going to have to reintroduce systemic therapy. I realize that she is on Fareston right now. It is hard to say it is really is doing much for [place]. However, I will just keep her on this for right now.  I probably will consider a PET scan in March.  Again, she has always wanted her quality of life to be her primary focus. There apparently is going to be a being trip to the beach in June. I want to make sure that she is going to be able to go on this and enjoy it.  I'll see her back in 3-4 weeks.  As always, we had a very good prayer session. She was likes to pray with me when she visits. It just makes her feel whole lot better.  [name], MD 2/9/20187:14 AM

## 2016-12-06 ENCOUNTER — Ambulatory Visit
Admission: RE | Admit: 2016-12-06 | Discharge: 2016-12-06 | Disposition: A | Discharge status: Home / Self Care | Payer: 59 | Source: Ambulatory Visit | Attending: Radiation Oncology | Admitting: Radiation Oncology

## 2016-12-06 DIAGNOSIS — Z7982 Long term (current) use of aspirin: Secondary | ICD-10-CM | POA: Diagnosis not present

## 2016-12-06 DIAGNOSIS — C50911 Malignant neoplasm of unspecified site of right female breast: Secondary | ICD-10-CM | POA: Diagnosis not present

## 2016-12-06 DIAGNOSIS — Z510 Encounter for antineoplastic radiation therapy: Secondary | ICD-10-CM | POA: Diagnosis not present

## 2016-12-06 DIAGNOSIS — Z170 Estrogen receptor positive status [ER+]: Secondary | ICD-10-CM | POA: Diagnosis not present

## 2016-12-06 DIAGNOSIS — C787 Secondary malignant neoplasm of liver and intrahepatic bile duct: Secondary | ICD-10-CM | POA: Diagnosis not present

## 2016-12-06 DIAGNOSIS — Z79899 Other long term (current) drug therapy: Secondary | ICD-10-CM | POA: Diagnosis not present

## 2016-12-08 ENCOUNTER — Encounter: Payer: Self-pay | Admitting: Radiation Oncology

## 2016-12-08 ENCOUNTER — Ambulatory Visit
Admission: RE | Admit: 2016-12-08 | Discharge: 2016-12-08 | Disposition: A | Discharge status: Home / Self Care | Payer: 59 | Source: Ambulatory Visit | Attending: Radiation Oncology | Admitting: Radiation Oncology

## 2016-12-08 DIAGNOSIS — C787 Secondary malignant neoplasm of liver and intrahepatic bile duct: Secondary | ICD-10-CM | POA: Diagnosis not present

## 2016-12-08 DIAGNOSIS — C50911 Malignant neoplasm of unspecified site of right female breast: Secondary | ICD-10-CM | POA: Diagnosis not present

## 2016-12-08 DIAGNOSIS — Z510 Encounter for antineoplastic radiation therapy: Secondary | ICD-10-CM | POA: Diagnosis not present

## 2016-12-08 DIAGNOSIS — Z170 Estrogen receptor positive status [ER+]: Secondary | ICD-10-CM | POA: Diagnosis not present

## 2016-12-08 DIAGNOSIS — Z7982 Long term (current) use of aspirin: Secondary | ICD-10-CM | POA: Diagnosis not present

## 2016-12-08 DIAGNOSIS — Z79899 Other long term (current) drug therapy: Secondary | ICD-10-CM | POA: Diagnosis not present

## 2016-12-08 NOTE — Progress Notes (Signed)
SBRT 2/3 completed Liver, on 2 liters N/C Oxygen,  C/o queasiness but takes Zofran which helps, takes dilaudid and has fentanyl patch for pain,  3/10 scale at present, gave follow up appt  11:49 AM BP 128/76 (BP Location: Left Arm, Patient Position: Sitting, Cuff Size: Normal)   Pulse 82   Temp 98.5 F (36.9 C) (Oral)   Resp 20   Wt 207 lb 3.2 oz (94 kg)   LMP 06/09/2014   SpO2 96% Comment: 2 liters n/c  BMI 32.45 kg/m   Wt Readings from Last 3 Encounters:  12/08/16 207 lb 3.2 oz (94 kg)  11/30/16 203 lb 6.4 oz (92.3 kg)  10/19/16 194 lb 3.2 oz (88.1 kg)

## 2016-12-09 NOTE — Progress Notes (Signed)
Department of Radiation Oncology  Phone:  [phone] Fax:        [phone]  Weekly Treatment Note    Name: Bianca Kent Date: 12/09/2016 MRN: [id number] DOB: 1966-06-23   Diagnosis:     ICD-9-CM ICD-10-CM   1. Liver metastasis (HCC) 197.7 C78.7      Current dose: 36 Gy  Current fraction: 2   MEDICATIONS: Current Outpatient Prescriptions  Medication Sig Dispense Refill  . albuterol (PROVENTIL HFA;VENTOLIN HFA) 108 (90 BASE) MCG/ACT inhaler Inhale 2 puffs into the lungs every 4 (four) hours as needed for wheezing or shortness of breath. 2 Inhaler 6  . ALPRAZolam (XANAX) 1 MG tablet Take 1 tablet (1 mg total) by mouth 2 (two) times daily as needed for anxiety. 60 tablet 2  . aspirin (ASPIRIN EC) 81 MG EC tablet Take 81 mg by mouth daily. Swallow whole.    . diphenhydrAMINE (SOMINEX) 25 MG tablet Take 100 mg by mouth at bedtime as needed for sleep. Pt states she is taking up to 200 mg Benadryl at night and still not sleeping    . dronabinol (MARINOL) 10 MG capsule Take 1 capsule (10 mg total) by mouth 3 (three) times daily before meals. 90 capsule 0  . ergocalciferol (VITAMIN D2) 50000 units capsule Take 1 capsule (50,000 Units total) by mouth 2 (two) times a week. 8 capsule 6  . fentaNYL (DURAGESIC - DOSED MCG/HR) 100 MCG/HR Place 1 patch (100 mcg total) onto the skin every other day. 15 patch 0  . HYDROmorphone (DILAUDID) 4 MG tablet Take 1 tablet (4 mg total) by mouth every 4 (four) hours as needed for severe pain. 90 tablet 0  . Lactulose SOLN Take 2 TBLSP every 6 hours until bowel movement 1000 mL 4  . lidocaine-prilocaine (EMLA) cream Apply 1 application topically as needed. 30 g 6  . loratadine-pseudoephedrine (CLARITIN-D 24-HOUR) 10-240 MG per 24 hr tablet Take 1 tablet by mouth daily.    . methylphenidate (RITALIN) 10 MG tablet Take 1 tablet (10 mg total) by mouth 2 (two) times daily. 60 tablet 0  . mirtazapine (REMERON) 30 MG tablet TAKE 1 TABLET (30 MG TOTAL)  BY MOUTH AT BEDTIME. 30 tablet 3  . Multiple Vitamin (MULTIVITAMIN) tablet Take 1 tablet by mouth daily.    Marland Kitchen Nystatin POWD 1 application by Does not apply route 2 (two) times daily. 1 Bottle 3  . ondansetron (ZOFRAN) 8 MG tablet Take 1 tablet (8 mg total) by mouth every 8 (eight) hours as needed for nausea or vomiting. 60 tablet 2  . pantoprazole (PROTONIX) 40 MG tablet Take 1 tablet (40 mg total) by mouth daily. 30 tablet 6  . promethazine (PHENERGAN) 25 MG suppository Place 1 suppository (25 mg total) rectally every 6 (six) hours as needed for nausea or vomiting. 60 each 3  . Sennosides-Docusate Sodium (SENNA-S PO) Take 1 tablet by mouth as needed.    . sertraline (ZOLOFT) 100 MG tablet Take 1 tablet (100 mg total) by mouth daily. 30 tablet 6  . Toremifene Citrate (FARESTON) 60 MG tablet Take 1 tablet (60 mg total) by mouth daily. 30 tablet 6  . traZODone (DESYREL) 100 MG tablet TAKE 1 TO 2 TABLETS BY MOUTH AS NEEDED FOR SLEEP 60 tablet 2   No current facility-administered medications for this encounter.    Facility-Administered Medications Ordered in Other Encounters  Medication Dose Route Frequency Provider Last Rate Last Dose  . HYDROmorphone (DILAUDID) injection 2 mg  2 mg Intravenous  Q4H PRN [name], MD   2 mg at 04/20/16 1233     ALLERGIES: Iohexol; Prednisone; Tetanus toxoids; Theophyllines; and Versed [midazolam]   LABORATORY DATA:  Lab Results  Component Value Date   WBC 4.9 11/30/2016   HGB 12.9 11/30/2016   HCT 38.6 11/30/2016   MCV 92 11/30/2016   PLT 105 (L) 11/30/2016   Lab Results  Component Value Date   NA 145 11/30/2016   K 3.3 11/30/2016   CL 105 11/30/2016   CO2 30 11/30/2016   Lab Results  Component Value Date   ALT 28 11/30/2016   AST 28 11/30/2016   ALKPHOS 165 (H) 11/30/2016   BILITOT 0.60 11/30/2016     NARRATIVE: Bianca Kent was seen today for weekly treatment management. The chart was checked and the patient's films were  reviewed.  The patient is doing very well with her course of stereotactic body radiation treatment. Some mild fatigue but otherwise doing excellent. No significant nausea/diarrhea.  PHYSICAL EXAMINATION: vitals were not taken for this visit.     Alert, no acute distress. All of her questions were answered today.  ASSESSMENT: The patient is doing satisfactorily with treatment.  PLAN: We will continue with the patient's radiation treatment as planned.

## 2016-12-11 ENCOUNTER — Ambulatory Visit
Admission: RE | Admit: 2016-12-11 | Discharge: 2016-12-11 | Disposition: A | Discharge status: Home / Self Care | Payer: 59 | Source: Ambulatory Visit | Attending: Radiation Oncology | Admitting: Radiation Oncology

## 2016-12-11 DIAGNOSIS — Z79899 Other long term (current) drug therapy: Secondary | ICD-10-CM | POA: Diagnosis not present

## 2016-12-11 DIAGNOSIS — C787 Secondary malignant neoplasm of liver and intrahepatic bile duct: Secondary | ICD-10-CM | POA: Diagnosis not present

## 2016-12-11 DIAGNOSIS — C50911 Malignant neoplasm of unspecified site of right female breast: Secondary | ICD-10-CM | POA: Diagnosis not present

## 2016-12-11 DIAGNOSIS — Z170 Estrogen receptor positive status [ER+]: Secondary | ICD-10-CM | POA: Diagnosis not present

## 2016-12-11 DIAGNOSIS — Z510 Encounter for antineoplastic radiation therapy: Secondary | ICD-10-CM | POA: Diagnosis not present

## 2016-12-11 DIAGNOSIS — Z7982 Long term (current) use of aspirin: Secondary | ICD-10-CM | POA: Diagnosis not present

## 2016-12-13 ENCOUNTER — Ambulatory Visit: Payer: 59 | Admitting: Radiation Oncology

## 2016-12-13 ENCOUNTER — Encounter: Payer: Self-pay | Admitting: Radiation Oncology

## 2016-12-13 NOTE — Progress Notes (Signed)
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  She reported mild fatigue, and denied significant nausea/diarrhea.   Plan: The patient has completed radiation treatment. The patient will return to radiation oncology clinic for routine followup in one month. I advised them to call or return sooner if they have any questions or concerns related to their recovery or treatment.  ------------------------------------------------  Jodelle Gross, MD, PhD  This document serves as a record of services personally performed by Kyung Rudd, MD. It was created on his behalf by Bethann Humble, a trained medical scribe. The creation of this record is based on the scribe's personal observations and the provider's statements to them. This document has been checked and approved by the attending provider.

## 2016-12-15 ENCOUNTER — Telehealth: Payer: Self-pay | Admitting: *Deleted

## 2016-12-15 ENCOUNTER — Ambulatory Visit: Payer: 59 | Admitting: Radiation Oncology

## 2016-12-15 MED ORDER — OSELTAMIVIR PHOSPHATE 75 MG PO CAPS: 75.0000 mg | ORAL_CAPSULE | Freq: Two times a day (BID) | ORAL | 0 refills | Status: DC

## 2016-12-15 MED FILL — OSELTAMIVIR PHOSPHATE 75 MG: 75 | 10 days supply | Qty: 20 | Fill #0

## 2016-12-15 NOTE — Telephone Encounter (Signed)
Sister notifying office of patient symptoms.  She woke up this morning with a fever of 101.4, fever, chills, sore throat and minimal cough. She has had no sick contacts that she's aware of.  Dr [name] would like to send a prescription for Tamiflu. Robin aware of new prescription and pharmacy confirmed.   She knows to bring patient to the ED if she isn't eating or drinking, her respiratory states declines or any other concerning symptoms.

## 2016-12-18 ENCOUNTER — Telehealth: Payer: Self-pay | Admitting: *Deleted

## 2016-12-18 NOTE — Telephone Encounter (Signed)
Called patient to alter fu appt. On 01-23-17 to 01-16-17 @ 3 pm, lvm for a return call

## 2016-12-20 DIAGNOSIS — J9601 Acute respiratory failure with hypoxia: Secondary | ICD-10-CM | POA: Diagnosis not present

## 2016-12-26 MED FILL — FARESTON 60 MG TABLET: 60 | 30 days supply | Qty: 30 | Fill #4

## 2016-12-26 MED FILL — traZODone HCL 100 MG TABS: 100 | 30 days supply | Qty: 60 | Fill #2

## 2016-12-26 MED FILL — SERTRALINE HCL 100 MG TAB: 100 | 30 days supply | Qty: 30 | Fill #1

## 2017-01-11 ENCOUNTER — Ambulatory Visit: Payer: 59

## 2017-01-11 ENCOUNTER — Other Ambulatory Visit: Payer: Self-pay | Admitting: *Deleted

## 2017-01-11 ENCOUNTER — Ambulatory Visit (HOSPITAL_BASED_OUTPATIENT_CLINIC_OR_DEPARTMENT_OTHER): Payer: 59

## 2017-01-11 ENCOUNTER — Other Ambulatory Visit (HOSPITAL_BASED_OUTPATIENT_CLINIC_OR_DEPARTMENT_OTHER): Payer: 59

## 2017-01-11 ENCOUNTER — Encounter: Payer: Self-pay | Admitting: Hematology & Oncology

## 2017-01-11 ENCOUNTER — Ambulatory Visit (HOSPITAL_BASED_OUTPATIENT_CLINIC_OR_DEPARTMENT_OTHER): Payer: 59 | Admitting: Hematology & Oncology

## 2017-01-11 VITALS — BP 98/69 | HR 98 | Temp 98.3°F | Resp 20 | Ht 67.0 in | Wt 202.0 lb

## 2017-01-11 DIAGNOSIS — C50911 Malignant neoplasm of unspecified site of right female breast: Secondary | ICD-10-CM

## 2017-01-11 DIAGNOSIS — Z170 Estrogen receptor positive status [ER+]: Secondary | ICD-10-CM | POA: Diagnosis not present

## 2017-01-11 DIAGNOSIS — G4701 Insomnia due to medical condition: Secondary | ICD-10-CM

## 2017-01-11 DIAGNOSIS — R5383 Other fatigue: Secondary | ICD-10-CM

## 2017-01-11 DIAGNOSIS — C7931 Secondary malignant neoplasm of brain: Secondary | ICD-10-CM | POA: Diagnosis not present

## 2017-01-11 DIAGNOSIS — C50912 Malignant neoplasm of unspecified site of left female breast: Secondary | ICD-10-CM

## 2017-01-11 DIAGNOSIS — C50919 Malignant neoplasm of unspecified site of unspecified female breast: Secondary | ICD-10-CM

## 2017-01-11 DIAGNOSIS — M810 Age-related osteoporosis without current pathological fracture: Secondary | ICD-10-CM

## 2017-01-11 DIAGNOSIS — R978 Other abnormal tumor markers: Secondary | ICD-10-CM | POA: Diagnosis not present

## 2017-01-11 DIAGNOSIS — Z95828 Presence of other vascular implants and grafts: Secondary | ICD-10-CM

## 2017-01-11 DIAGNOSIS — K909 Intestinal malabsorption, unspecified: Secondary | ICD-10-CM

## 2017-01-11 DIAGNOSIS — R64 Cachexia: Secondary | ICD-10-CM

## 2017-01-11 DIAGNOSIS — D508 Other iron deficiency anemias: Secondary | ICD-10-CM

## 2017-01-11 LAB — CBC WITH DIFFERENTIAL (CANCER CENTER ONLY)
BASO#: 0 10e3/uL (ref 0.0–0.2)
BASO%: 0.7 % (ref 0.0–2.0)
EOS%: 2.2 % (ref 0.0–7.0)
Eosinophils Absolute: 0.1 10e3/uL (ref 0.0–0.5)
HCT: 38.7 % (ref 34.8–46.6)
HGB: 12.6 g/dL (ref 11.6–15.9)
LYMPH#: 0.6 10e3/uL — ABNORMAL LOW (ref 0.9–3.3)
LYMPH%: 10.9 % — ABNORMAL LOW (ref 14.0–48.0)
MCH: 29.9 pg (ref 26.0–34.0)
MCHC: 32.6 g/dL (ref 32.0–36.0)
MCV: 92 fL (ref 81–101)
MONO#: 0.5 10e3/uL (ref 0.1–0.9)
MONO%: 9.5 % (ref 0.0–13.0)
NEUT#: 4.2 10e3/uL (ref 1.5–6.5)
NEUT%: 76.7 % (ref 39.6–80.0)
Platelets: 72 10e3/uL — ABNORMAL LOW (ref 145–400)
RBC: 4.21 10e6/uL (ref 3.70–5.32)
RDW: 14.5 % (ref 11.1–15.7)
WBC: 5.5 10e3/uL (ref 3.9–10.0)

## 2017-01-11 LAB — CMP (CANCER CENTER ONLY)
ALT(SGPT): 33 U/L (ref 10–47)
AST: 41 U/L — ABNORMAL HIGH (ref 11–38)
Albumin: 3.8 g/dL (ref 3.3–5.5)
Alkaline Phosphatase: 219 U/L — ABNORMAL HIGH (ref 26–84)
BUN, Bld: 10 mg/dL (ref 7–22)
CO2: 29 meq/L (ref 18–33)
Calcium: 9 mg/dL (ref 8.0–10.3)
Chloride: 106 meq/L (ref 98–108)
Creat: 1.2 mg/dL (ref 0.6–1.2)
Glucose, Bld: 134 mg/dL — ABNORMAL HIGH (ref 73–118)
Potassium: 3.8 meq/L (ref 3.3–4.7)
Sodium: 145 meq/L (ref 128–145)
Total Bilirubin: 0.9 mg/dL (ref 0.20–1.60)
Total Protein: 7.6 g/dL (ref 6.4–8.1)

## 2017-01-11 LAB — IRON AND TIBC
%SAT: 34 % (ref 21–57)
Iron: 77 ug/dL (ref 41–142)
TIBC: 228 ug/dL — ABNORMAL LOW (ref 236–444)
UIBC: 150 ug/dL (ref 120–384)

## 2017-01-11 LAB — FERRITIN: FERRITIN: 415 ng/mL — AB (ref 9–269)

## 2017-01-11 MED ORDER — ALPRAZOLAM 1 MG PO TABS: 1.0000 mg | ORAL_TABLET | Freq: Two times a day (BID) | ORAL | 2 refills | Status: AC | PRN

## 2017-01-11 MED ORDER — ALPRAZOLAM 1 MG PO TABS: 1.0000 mg | ORAL_TABLET | Freq: Two times a day (BID) | ORAL | 2 refills | Status: DC | PRN

## 2017-01-11 MED ORDER — HYDROMORPHONE HCL 1 MG/ML IJ SOLN: 2.0000 mg | Freq: Once | INTRAMUSCULAR | Status: DC

## 2017-01-11 MED ORDER — SODIUM CHLORIDE 0.9 % IV SOLN
INTRAVENOUS | Status: AC
  Administered 2017-01-11: 10:00:00 via INTRAVENOUS

## 2017-01-11 MED ORDER — ALTEPLASE 2 MG IJ SOLR
2.0000 mg | Freq: Once | INTRAMUSCULAR | Status: DC | PRN
  Filled 2017-01-11: qty 2

## 2017-01-11 MED ORDER — ZOLEDRONIC ACID 4 MG/100ML IV SOLN
4.0000 mg | Freq: Once | INTRAVENOUS | Status: AC
  Administered 2017-01-11: 4 mg via INTRAVENOUS
  Filled 2017-01-11: qty 100

## 2017-01-11 MED ORDER — ABEMACICLIB 200 MG PO TABS: 200.0000 mg | ORAL_TABLET | Freq: Two times a day (BID) | ORAL | 4 refills | Status: DC

## 2017-01-11 MED ORDER — HEPARIN SOD (PORK) LOCK FLUSH 100 UNIT/ML IV SOLN
500.0000 [IU] | Freq: Once | INTRAVENOUS | Status: AC | PRN
  Administered 2017-01-11: 500 [IU] via INTRAVENOUS
  Filled 2017-01-11: qty 5

## 2017-01-11 MED ORDER — SODIUM CHLORIDE 0.9 % IJ SOLN
10.0000 mL | INTRAMUSCULAR | Status: DC | PRN
  Administered 2017-01-11: 10 mL via INTRAVENOUS
  Filled 2017-01-11: qty 10

## 2017-01-11 MED FILL — MIRTAZAPINE 30 MG TABLET: 30 | 30 days supply | Qty: 30 | Fill #1

## 2017-01-11 MED FILL — ALPRAZolam 1 MG TABS: 1 | 30 days supply | Qty: 60 | Fill #0

## 2017-01-11 NOTE — Patient Instructions (Signed)
Zoledronic Acid injection (Hypercalcemia, Oncology) What is this medicine? ZOLEDRONIC ACID (ZOE le dron ik AS id) lowers the amount of calcium loss from bone. It is used to treat too much calcium in your blood from cancer. It is also used to prevent complications of cancer that has spread to the bone. This medicine may be used for other purposes; ask your health care provider or pharmacist if you have questions. COMMON BRAND NAME(S): Zometa What should I tell my health care provider before I take this medicine? They need to know if you have any of these conditions: -aspirin-sensitive asthma -cancer, especially if you are receiving medicines used to treat cancer -dental disease or wear dentures -infection -kidney disease -receiving corticosteroids like dexamethasone or prednisone -an unusual or allergic reaction to zoledronic acid, other medicines, foods, dyes, or preservatives -pregnant or trying to get pregnant -breast-feeding How should I use this medicine? This medicine is for infusion into a vein. It is given by a health care professional in a hospital or clinic setting. Talk to your pediatrician regarding the use of this medicine in children. Special care may be needed. Overdosage: If you think you have taken too much of this medicine contact a poison control center or emergency room at once. NOTE: This medicine is only for you. Do not share this medicine with others. What if I miss a dose? It is important not to miss your dose. Call your doctor or health care professional if you are unable to keep an appointment. What may interact with this medicine? -certain antibiotics given by injection -NSAIDs, medicines for pain and inflammation, like ibuprofen or naproxen -some diuretics like bumetanide, furosemide -teriparatide -thalidomide This list may not describe all possible interactions. Give your health care provider a list of all the medicines, herbs, non-prescription drugs, or  dietary supplements you use. Also tell them if you smoke, drink alcohol, or use illegal drugs. Some items may interact with your medicine. What should I watch for while using this medicine? Visit your doctor or health care professional for regular checkups. It may be some time before you see the benefit from this medicine. Do not stop taking your medicine unless your doctor tells you to. Your doctor may order blood tests or other tests to see how you are doing. Women should inform their doctor if they wish to become pregnant or think they might be pregnant. There is a potential for serious side effects to an unborn child. Talk to your health care professional or pharmacist for more information. You should make sure that you get enough calcium and vitamin D while you are taking this medicine. Discuss the foods you eat and the vitamins you take with your health care professional. Some people who take this medicine have severe bone, joint, and/or muscle pain. This medicine may also increase your risk for jaw problems or a broken thigh bone. Tell your doctor right away if you have severe pain in your jaw, bones, joints, or muscles. Tell your doctor if you have any pain that does not go away or that gets worse. Tell your dentist and dental surgeon that you are taking this medicine. You should not have major dental surgery while on this medicine. See your dentist to have a dental exam and fix any dental problems before starting this medicine. Take good care of your teeth while on this medicine. Make sure you see your dentist for regular follow-up appointments. What side effects may I notice from receiving this medicine? Side effects that   you should report to your doctor or health care professional as soon as possible: -allergic reactions like skin rash, itching or hives, swelling of the face, lips, or tongue -anxiety, confusion, or depression -breathing problems -changes in vision -eye pain -feeling faint or  lightheaded, falls -jaw pain, especially after dental work -mouth sores -muscle cramps, stiffness, or weakness -redness, blistering, peeling or loosening of the skin, including inside the mouth -trouble passing urine or change in the amount of urine Side effects that usually do not require medical attention (report to your doctor or health care professional if they continue or are bothersome): -bone, joint, or muscle pain -constipation -diarrhea -fever -hair loss -irritation at site where injected -loss of appetite -nausea, vomiting -stomach upset -trouble sleeping -trouble swallowing -weak or tired This list may not describe all possible side effects. Call your doctor for medical advice about side effects. You may report side effects to FDA at 1-800-FDA-1088. Where should I keep my medicine? This drug is given in a hospital or clinic and will not be stored at home. NOTE: This sheet is a summary. It may not cover all possible information. If you have questions about this medicine, talk to your doctor, pharmacist, or health care provider.  2018 Elsevier/Gold Standard (2014-03-07 14:19:39) Dehydration, Adult Dehydration is when there is not enough fluid or water in your body. This happens when you lose more fluids than you take in. Dehydration can range from mild to very bad. It should be treated right away to keep it from getting very bad. Symptoms of mild dehydration may include:   Thirst.  Dry lips.  Slightly dry mouth.  Dry, warm skin.  Dizziness. Symptoms of moderate dehydration may include:   Very dry mouth.  Muscle cramps.  Dark pee (urine). Pee may be the color of tea.  Your body making less pee.  Your eyes making fewer tears.  Heartbeat that is uneven or faster than normal (palpitations).  Headache.  Light-headedness, especially when you stand up from sitting.  Fainting (syncope). Symptoms of very bad dehydration may include:   Changes in skin, such  as:  Cold and clammy skin.  Blotchy (mottled) or pale skin.  Skin that does not quickly return to normal after being lightly pinched and let go (poor skin turgor).  Changes in body fluids, such as:  Feeling very thirsty.  Your eyes making fewer tears.  Not sweating when body temperature is high, such as in hot weather.  Your body making very little pee.  Changes in vital signs, such as:  Weak pulse.  Pulse that is more than 100 beats a minute when you are sitting still.  Fast breathing.  Low blood pressure.  Other changes, such as:  Sunken eyes.  Cold hands and feet.  Confusion.  Lack of energy (lethargy).  Trouble waking up from sleep.  Short-term weight loss.  Unconsciousness. Follow these instructions at home:  If told by your doctor, drink an ORS:  Make an ORS by using instructions on the package.  Start by drinking small amounts, about  cup (120 mL) every 5-10 minutes.  Slowly drink more until you have had the amount that your doctor said to have.  Drink enough clear fluid to keep your pee clear or pale yellow. If you were told to drink an ORS, finish the ORS first, then start slowly drinking clear fluids. Drink fluids such as:  Water. Do not drink only water by itself. Doing that can make the salt (sodium) level in  your body get too low (hyponatremia).  Ice chips.  Fruit juice that you have added water to (diluted).  Low-calorie sports drinks.  Avoid:  Alcohol.  Drinks that have a lot of sugar. These include high-calorie sports drinks, fruit juice that does not have water added, and soda.  Caffeine.  Foods that are greasy or have a lot of fat or sugar.  Take over-the-counter and prescription medicines only as told by your doctor.  Do not take salt tablets. Doing that can make the salt level in your body get too high (hypernatremia).  Eat foods that have minerals (electrolytes). Examples include bananas, oranges, potatoes, tomatoes,  and spinach.  Keep all follow-up visits as told by your doctor. This is important. Contact a doctor if:  You have belly (abdominal) pain that:  Gets worse.  Stays in one area (localizes).  You have a rash.  You have a stiff neck.  You get angry or annoyed more easily than normal (irritability).  You are more sleepy than normal.  You have a harder time waking up than normal.  You feel:  Weak.  Dizzy.  Very thirsty.  You have peed (urinated) only a small amount of very dark pee during 6-8 hours. Get help right away if:  You have symptoms of very bad dehydration.  You cannot drink fluids without throwing up (vomiting).  Your symptoms get worse with treatment.  You have a fever.  You have a very bad headache.  You are throwing up or having watery poop (diarrhea) and it:  Gets worse.  Does not go away.  You have blood or something green (bile) in your throw-up.  You have blood in your poop (stool). This may cause poop to look black and tarry.  You have not peed in 6-8 hours.  You pass out (faint).  Your heart rate when you are sitting still is more than 100 beats a minute.  You have trouble breathing. This information is not intended to replace advice given to you by your health care provider. Make sure you discuss any questions you have with your health care provider. Document Released: 08/05/2009 Document Revised: 04/28/2016 Document Reviewed: 12/03/2015 Elsevier Interactive Patient Education  2017 [employer].

## 2017-01-11 NOTE — Progress Notes (Signed)
Hematology and Oncology Follow Up Visit  Bianca Kent [id number] [birth year] 51 y.o. 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  JFreezeis back for followup . She is not doing as well. 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 She has been to cardiology. She has had cardiac calves. She does not want to go to the emergency room for this.  She says these pains are improving.  She has completed radiation to the liver met. This was back in February. I think she had 2 fractions.  Unfortunately, her CA-27-29 went up when last saw her. It went from 39-65. Progression is been off her Herceptin/Perjeta. She was having some issues with this. I want her to be off this for a while. She has gone through these side effects. Her mouth is no longer sore. She is gaining some weight. Her appetite is doing better.  She still wears oxygen. She has a paralyzed hemidiaphragm. This is about the same.  She's had no issues with fever. She's had no rashes.  She does have chronic pain issues. The pain is non-oncologic.  She has had no headache.  Overall, her performance status is ECOG 1-2.   Medications:  Current Outpatient Prescriptions:  .  Abemaciclib (VERZENIO) 200 MG TABS, Take 200 mg by mouth 2 (two) times daily with a meal., Disp: 60 tablet, Rfl: 4 .  albuterol (PROVENTIL HFA;VENTOLIN HFA) 108 (90 BASE) MCG/ACT inhaler, Inhale 2 puffs into the lungs every 4 (four) hours as needed for wheezing or shortness of breath., Disp: 2 Inhaler, Rfl: 6 .   ALPRAZolam (XANAX) 1 MG tablet, Take 1 tablet (1 mg total) by mouth 2 (two) times daily as needed for anxiety., Disp: 60 tablet, Rfl: 2 .  aspirin (ASPIRIN EC) 81 MG EC tablet, Take 81 mg by mouth daily. Swallow whole., Disp: , Rfl:  .  diphenhydrAMINE (SOMINEX) 25 MG tablet, Take 100 mg by mouth at bedtime as needed for sleep. Pt states she is taking up to 200 mg Benadryl at night and still not sleeping, Disp: , Rfl:  .  dronabinol (MARINOL) 10 MG capsule, Take 1 capsule (10 mg total) by mouth 3 (three) times daily before meals., Disp: 90 capsule, Rfl: 0 .  ergocalciferol (VITAMIN D2) 50000 units capsule, Take 1 capsule (50,000 Units total) by mouth 2 (two) times a week., Disp: 8 capsule, Rfl: 6 .  fentaNYL (DURAGESIC - DOSED MCG/HR) 100 MCG/HR, Place 1 patch (100 mcg total) onto the skin every other day., Disp: 15 patch, Rfl: 0 .  HYDROmorphone (DILAUDID) 4 MG tablet, Take 1 tablet (4 mg total) by mouth every 4 (four) hours as needed for severe pain., Disp: 90 tablet, Rfl: 0 .  Lactulose SOLN, Take 2 TBLSP every 6 hours until bowel movement, Disp: 1000 mL, Rfl: 4 .  lidocaine-prilocaine (EMLA) cream, Apply 1 application topically as needed., Disp: 30 g, Rfl:  6 .  loratadine-pseudoephedrine (CLARITIN-D 24-HOUR) 10-240 MG per 24 hr tablet, Take 1 tablet by mouth daily., Disp: , Rfl:  .  methylphenidate (RITALIN) 10 MG tablet, Take 1 tablet (10 mg total) by mouth 2 (two) times daily., Disp: 60 tablet, Rfl: 0 .  mirtazapine (REMERON) 30 MG tablet, TAKE 1 TABLET (30 MG TOTAL) BY MOUTH AT BEDTIME., Disp: 30 tablet, Rfl: 3 .  Multiple Vitamin (MULTIVITAMIN) tablet, Take 1 tablet by mouth daily., Disp: , Rfl:  .  Nystatin POWD, 1 application by Does not apply route 2 (two) times daily., Disp: 1 Bottle, Rfl: 3 .  ondansetron (ZOFRAN) 8 MG tablet, Take 1 tablet (8 mg total) by mouth every 8 (eight) hours as needed for nausea or vomiting., Disp: 60 tablet, Rfl: 2 .  pantoprazole (PROTONIX) 40 MG tablet, Take 1  tablet (40 mg total) by mouth daily., Disp: 30 tablet, Rfl: 6 .  promethazine (PHENERGAN) 25 MG suppository, Place 1 suppository (25 mg total) rectally every 6 (six) hours as needed for nausea or vomiting., Disp: 60 each, Rfl: 3 .  Sennosides-Docusate Sodium (SENNA-S PO), Take 1 tablet by mouth as needed., Disp: , Rfl:  .  sertraline (ZOLOFT) 100 MG tablet, Take 1 tablet (100 mg total) by mouth daily., Disp: 30 tablet, Rfl: 6 .  traZODone (DESYREL) 100 MG tablet, TAKE 1 TO 2 TABLETS BY MOUTH AS NEEDED FOR SLEEP, Disp: 60 tablet, Rfl: 2 No current facility-administered medications for this visit.   Facility-Administered Medications Ordered in Other Visits:  .  alteplase (CATHFLO ACTIVASE) injection 2 mg, 2 mg, Intracatheter, Once PRN, [name], MD .  HYDROmorphone (DILAUDID) injection 2 mg, 2 mg, Intravenous, Q4H PRN, [name], MD, 2 mg at 04/20/16 1233 .  sodium chloride 0.9 % injection 10 mL, 10 mL, Intravenous, PRN, [name], MD, 10 mL at 01/11/17 1333  Allergies:  Allergies  Allergen Reactions  . Iohexol      Code: RASH, Desc: VERY STRONG FAMILY HX OF ANGIOEDEMA WHEN RECEIVING IV CONTRAST; PT HAS BEEN PREMEDICATED FOR OTHER CONTRASTED STUDIES(IN CATH. LAB)  KR, Onset Date: [id number]   . Prednisone Itching    Capillary beds bust  . Tetanus Toxoids Other (See Comments)    Ran a high fever for 48 hours  . Theophyllines Hives    Mental changes  . Versed [Midazolam] Other (See Comments)    Pt becomes violent    Past Medical History, Surgical history, Social history, and Family History were reviewed and updated.  Review of Systems: As above  Physical Exam:  height is 5' 7" (1.702 m) and weight is 202 lb (91.6 kg). Her oral temperature is 98.3 F (36.8 C). Her blood pressure is 98/69 and her pulse is 98. Her respiration is 20.   Well-developed and well-nourished white female. Head and exam shows no ocular or oral lesions. She's able to move both eyes well. She has  good extraocular muscle . There is no erythema or exudate from the left eye. There might be some slight fullness in the right axilla by do not detect any obvious adenopathy. I cannot detect any obvious right supraclavicular lymph nodes. She has improved range of motion of the right shoulder. Left supraclavicular region is okay. No masses are noted. Lungs are clear. Cardiac exam regular rate and rhythm with no murmurs, rubs or bruits.. Abdomen is soft. She has good bowel sounds. There is no palpable liver or spleen tip. Back exam shows decreased tenderness over the  lumbar and thoracic spine to palpation. Extremities shows no clubbing, cyanosis or edema. She has 4/5 strength in her legs. Skin exam shows improvement in the radiation dermatitis of the right breast and axilla. There is no open wound. There is some hyper pigmentation. Neurological exam is nonfocal.    Lab Results  Component Value Date   WBC 5.5 01/11/2017   HGB 12.6 01/11/2017   HCT 38.7 01/11/2017   MCV 92 01/11/2017   PLT 72 (L) 01/11/2017     Chemistry      Component Value Date/Time   NA 145 01/11/2017 0817   NA 138 04/30/2015 1019   K 3.8 01/11/2017 0817   K 3.5 04/30/2015 1019   CL 106 01/11/2017 0817   CO2 29 01/11/2017 0817   CO2 29 04/30/2015 1019   BUN 10 01/11/2017 0817   BUN 9.7 04/30/2015 1019   CREATININE 1.2 01/11/2017 0817   CREATININE 0.8 04/30/2015 1019      Component Value Date/Time   CALCIUM 9.0 01/11/2017 0817   CALCIUM 9.7 04/30/2015 1019   ALKPHOS 219 (H) 01/11/2017 0817   ALKPHOS 257 (H) 04/30/2015 1019   AST 41 (H) 01/11/2017 0817   AST 57 (H) 04/30/2015 1019   ALT 33 01/11/2017 0817   ALT 41 04/30/2015 1019   BILITOT 0.90 01/11/2017 0817   BILITOT 0.60 04/30/2015 1019         Impression and Plan: Ms. Pounds is 51 year old white female with triple positive metastatic breast cancer.  IThink that we can stop the Fareston. She has been on this for about 6 months. Again with the rise in  CA 27.29, I have to believe that her disease is clearly active.  We have to get another PET scan on her. Last PET scan was back in December.  I think that given that she has ER positive disease, that we might want to consider Verzenio for her. This is the new CDK4/CDK6 inhibitor that is used as single agent for ER positive breast cancer. I think this would be very reasonable for her.  She has never been on this category of medications. I think she could tolerate this. She is a was wished to have good quality of life. I totally understand this.   We are trying to avoid chemotherapy.  I spent about 40 minutes talking to she and her sister.   ENNEVER,PETER R, MDI . 3/22/20182:43 PM

## 2017-01-12 LAB — CANCER ANTIGEN 27.29: CAN 27.29: 50.4 U/mL — AB (ref 0.0–38.6)

## 2017-01-15 MED FILL — VERZENIO 200 MG TAB: 200 | 28 days supply | Qty: 56 | Fill #0

## 2017-01-16 ENCOUNTER — Encounter: Payer: Self-pay | Admitting: *Deleted

## 2017-01-16 ENCOUNTER — Ambulatory Visit
Admission: RE | Admit: 2017-01-16 | Discharge: 2017-01-16 | Disposition: A | Discharge status: Home / Self Care | Payer: 59 | Source: Ambulatory Visit | Attending: Radiation Oncology | Admitting: Radiation Oncology

## 2017-01-16 NOTE — Progress Notes (Signed)
[place] left a messaged about her 0800 one month follow up appointment  today with Danise Mina, P.A. for Dr. Lisbeth Renshaw.  Asked to call ask for nurse Tonie at [phone].

## 2017-01-16 NOTE — Progress Notes (Signed)
1100 Called  scheduling to reschedule one month follow up appointment.

## 2017-01-19 DIAGNOSIS — J9601 Acute respiratory failure with hypoxia: Secondary | ICD-10-CM | POA: Diagnosis not present

## 2017-01-23 ENCOUNTER — Ambulatory Visit
Admission: RE | Admit: 2017-01-23 | Discharge: 2017-01-23 | Disposition: A | Discharge status: Home / Self Care | Payer: 59 | Source: Ambulatory Visit | Attending: Radiation Oncology | Admitting: Radiation Oncology

## 2017-01-23 ENCOUNTER — Encounter: Payer: Self-pay | Admitting: Radiation Oncology

## 2017-01-23 ENCOUNTER — Ambulatory Visit: Payer: Self-pay | Admitting: Radiation Oncology

## 2017-01-23 VITALS — BP 141/78 | HR 88 | Temp 98.5°F | Resp 16 | Ht 67.0 in | Wt 201.6 lb

## 2017-01-23 DIAGNOSIS — Z510 Encounter for antineoplastic radiation therapy: Secondary | ICD-10-CM | POA: Diagnosis not present

## 2017-01-23 DIAGNOSIS — Z7982 Long term (current) use of aspirin: Secondary | ICD-10-CM | POA: Diagnosis not present

## 2017-01-23 DIAGNOSIS — Z170 Estrogen receptor positive status [ER+]: Secondary | ICD-10-CM | POA: Diagnosis not present

## 2017-01-23 DIAGNOSIS — Z79899 Other long term (current) drug therapy: Secondary | ICD-10-CM | POA: Diagnosis not present

## 2017-01-23 DIAGNOSIS — C787 Secondary malignant neoplasm of liver and intrahepatic bile duct: Secondary | ICD-10-CM | POA: Insufficient documentation

## 2017-01-23 DIAGNOSIS — C50911 Malignant neoplasm of unspecified site of right female breast: Secondary | ICD-10-CM | POA: Insufficient documentation

## 2017-01-23 NOTE — Progress Notes (Signed)
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      On  O2 liters N/C, complained of pain 3/10 all over taking  Dilaudid and has Fentanyl patch for pain.  Taking Zofran for nausea as needed and using Phenergan suppository when she is unable to take the Zofran. Appetite is less because of the side affect of her Quentin Ore that she started 03-232-18 it's causing nausea.  Having fatigue most of the time.  No change with bowel pattern. Wt Readings from Last 3 Encounters:  01/23/17 201 lb 9.6 oz (91.4 kg)  01/11/17 202 lb (91.6 kg)  12/08/16 207 lb 3.2 oz (94 kg)  BP (!) 141/78   Pulse 88   Temp 98.5 F (36.9 C) (Oral)   Resp 16   Ht 5\' 7"  (1.702 m)   Wt 201 lb 9.6 oz (91.4 kg)   LMP 06/09/2014   SpO2 97%   BMI 31.58 kg/m

## 2017-01-24 ENCOUNTER — Telehealth: Payer: Self-pay

## 2017-01-24 NOTE — Progress Notes (Signed)
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 Sondra Come.  Narrative:  The patient returns today for routine follow-up after SBRT to the liver. She has previously been treated in the right breast and regional nodes in 2015. In 2016 she developed disease in the brain and received SRS. She was treated and is followed by Dr. Isidore Moos. She has had an elevation in her tumor marker recently and is going to have a PET scan in the next week. She continues on Verzeno for her systemic therapy.                          On review of systems, the patient states she's doing ok but does have diffuse nonspecific pain for which she continues dilaudid and fentanyl. She reports she's not had any difficulty with abdominal pain or nausea during or immediately following radiotherapy, however she does report with her systemic therapy, she does have trouble with nausea, but is using phenergan and zofran as needed. She reports she is nervous about her tumor markers and upcoming PET scan. She denies any progressive fatigue but continues to note this, and reports normal bowel function. No other complaints are noted.   ALLERGIES:  is allergic to iohexol; prednisone; tetanus toxoids; theophyllines; and versed  [midazolam].  Meds: Current Outpatient Prescriptions  Medication Sig Dispense Refill  . Abemaciclib (VERZENIO) 200 MG TABS Take 200 mg by mouth 2 (two) times daily with a meal. 60 tablet 4  . albuterol (PROVENTIL HFA;VENTOLIN HFA) 108 (90 BASE) MCG/ACT inhaler Inhale 2 puffs into the lungs every 4 (four) hours as needed for wheezing or shortness of breath. 2 Inhaler 6  . ALPRAZolam (XANAX) 1 MG tablet Take 1 tablet (1 mg total) by mouth 2 (two) times daily as needed for anxiety. 60 tablet 2  . aspirin (ASPIRIN EC) 81 MG EC tablet Take 81 mg by mouth daily. Swallow whole.    . diphenhydrAMINE (SOMINEX) 25 MG tablet Take 100 mg by mouth at bedtime as needed for sleep. Pt states she is taking up to 200 mg Benadryl at night and still not sleeping    . dronabinol (MARINOL) 10 MG capsule Take 1 capsule (10 mg total) by mouth 3 (three) times daily before meals. 90 capsule 0  . ergocalciferol (VITAMIN D2) 50000 units capsule Take 1 capsule (50,000 Units total) by mouth 2 (two) times a week. 8 capsule 6  . fentaNYL (DURAGESIC - DOSED MCG/HR) 100 MCG/HR Place 1 patch (100 mcg total) onto the  skin every other day. 15 patch 0  . HYDROmorphone (DILAUDID) 4 MG tablet Take 1 tablet (4 mg total) by mouth every 4 (four) hours as needed for severe pain. 90 tablet 0  . Lactulose SOLN Take 2 TBLSP every 6 hours until bowel movement 1000 mL 4  . lidocaine-prilocaine (EMLA) cream Apply 1 application topically as needed. 30 g 6  . loratadine-pseudoephedrine (CLARITIN-D 24-HOUR) 10-240 MG per 24 hr tablet Take 1 tablet by mouth daily.    . methylphenidate (RITALIN) 10 MG tablet Take 1 tablet (10 mg total) by mouth 2 (two) times daily. 60 tablet 0  . mirtazapine (REMERON) 30 MG tablet TAKE 1 TABLET (30 MG TOTAL) BY MOUTH AT BEDTIME. 30 tablet 3  . Multiple Vitamin (MULTIVITAMIN) tablet Take 1 tablet by mouth daily.    . ondansetron (ZOFRAN) 8 MG tablet Take 1 tablet (8 mg total) by mouth every 8 (eight) hours as needed  for nausea or vomiting. 60 tablet 2  . pantoprazole (PROTONIX) 40 MG tablet Take 1 tablet (40 mg total) by mouth daily. 30 tablet 6  . promethazine (PHENERGAN) 25 MG suppository Place 1 suppository (25 mg total) rectally every 6 (six) hours as needed for nausea or vomiting. 60 each 3  . Sennosides-Docusate Sodium (SENNA-S PO) Take 1 tablet by mouth as needed.    . sertraline (ZOLOFT) 100 MG tablet Take 1 tablet (100 mg total) by mouth daily. 30 tablet 6  . traZODone (DESYREL) 100 MG tablet TAKE 1 TO 2 TABLETS BY MOUTH AS NEEDED FOR SLEEP 60 tablet 2  . Nystatin POWD 1 application by Does not apply route 2 (two) times daily. (Patient not taking: Reported on 01/23/2017) 1 Bottle 3   No current facility-administered medications for this encounter.    Facility-Administered Medications Ordered in Other Encounters  Medication Dose Route Frequency Provider Last Rate Last Dose  . HYDROmorphone (DILAUDID) injection 2 mg  2 mg Intravenous Q4H PRN [name], MD   2 mg at 04/20/16 1233    Physical Findings:  height is 5\' 7"  (1.702 m) and weight is 201 lb 9.6 oz (91.4 kg). Her oral temperature is 98.5 F (36.9 C). Her blood pressure is 141/78 (abnormal) and her pulse is 88. Her respiration is 16 and oxygen saturation is 97%.  Pain Assessment Pain Score: 3  (all over)/10 In general this is a chronically ill appearing caucasian female in no acute distress. She's alert and oriented x4 and appropriate throughout the examination. Cardiopulmonary assessment is negative for acute distress and she exhibits normal effort. Abdomen is soft, non tender, non distended.   Lab Findings: Lab Results  Component Value Date   WBC 5.5 01/11/2017   HGB 12.6 01/11/2017   HCT 38.7 01/11/2017   MCV 92 01/11/2017   PLT 72 (L) 01/11/2017     Radiographic Findings: No results found.  Impression/Plan: 1. 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 The  patient appears to have tolerated her SBRT treatment to the liver. She will follow up with a PET scan for restaging given her elevation in tumor markers. She will continue under Dr. Antonieta Pert care, and continue to be followed in surveillance in the brain oncology program. She appears to be due for her next MRI of the brain, and we will coordinate this with our navigator, and present her scans at conference. She is in agreement and we will continue serial imaging of the brain.  Carola Rhine, PAC

## 2017-01-24 NOTE — Telephone Encounter (Signed)
Oral Chemotherapy Follow-Up Form Original Start date of oral chemotherapy: 01/16/17 ? Called patient today to follow up regarding patient's oral chemotherapy medication: 01/24/17 Pt is doing well today Pt reports 0 tablets/doses missed in the last week/month.   Pt reports the following side effects: dry mouth & tongue, nausea & fatigue. Nausea relieved by anti-emetics. Using magic mouthwash.  Assessed by radiation oncology today.  MuGuard prescribed by XRT.   ? Will follow with patient in office on 02/15/2017. ?

## 2017-01-25 ENCOUNTER — Other Ambulatory Visit: Payer: Self-pay | Admitting: Family

## 2017-01-25 ENCOUNTER — Telehealth: Payer: Self-pay | Admitting: *Deleted

## 2017-01-25 ENCOUNTER — Other Ambulatory Visit: Payer: Self-pay | Admitting: Radiation Therapy

## 2017-01-25 DIAGNOSIS — C7931 Secondary malignant neoplasm of brain: Secondary | ICD-10-CM

## 2017-01-25 DIAGNOSIS — K1230 Oral mucositis (ulcerative), unspecified: Secondary | ICD-10-CM

## 2017-01-25 NOTE — Telephone Encounter (Signed)
Patient's friend robin  requesting medication for tongue. States she saw [employer] PA yesterday at [facility] radiation oncology and allison suggested mugard for her tongue. Medication costs $646.00 dollars and must be prior authorized. States [facility] carries this medication. [phone]. They would like for dr [name] to order this for her. Clarise Cruz to e-scribe medication to [facility]. Friend robin notified.

## 2017-01-26 MED ORDER — MUGARD MT LIQD: 5.0000 mL | OROMUCOSAL | 1 refills | Status: AC | PRN

## 2017-01-31 ENCOUNTER — Other Ambulatory Visit: Payer: Self-pay | Admitting: Radiation Oncology

## 2017-02-02 ENCOUNTER — Ambulatory Visit (HOSPITAL_COMMUNITY)
Admission: RE | Admit: 2017-02-02 | Discharge: 2017-02-02 | Disposition: A | Discharge status: Home / Self Care | Payer: 59 | Source: Ambulatory Visit | Attending: Hematology & Oncology | Admitting: Hematology & Oncology

## 2017-02-02 DIAGNOSIS — G4701 Insomnia due to medical condition: Secondary | ICD-10-CM | POA: Diagnosis not present

## 2017-02-02 DIAGNOSIS — J323 Chronic sphenoidal sinusitis: Secondary | ICD-10-CM | POA: Diagnosis not present

## 2017-02-02 DIAGNOSIS — C50911 Malignant neoplasm of unspecified site of right female breast: Secondary | ICD-10-CM | POA: Diagnosis not present

## 2017-02-02 DIAGNOSIS — J9811 Atelectasis: Secondary | ICD-10-CM | POA: Insufficient documentation

## 2017-02-02 DIAGNOSIS — C787 Secondary malignant neoplasm of liver and intrahepatic bile duct: Secondary | ICD-10-CM | POA: Diagnosis not present

## 2017-02-02 DIAGNOSIS — R5383 Other fatigue: Secondary | ICD-10-CM | POA: Diagnosis not present

## 2017-02-02 DIAGNOSIS — R64 Cachexia: Secondary | ICD-10-CM | POA: Insufficient documentation

## 2017-02-02 DIAGNOSIS — C50912 Malignant neoplasm of unspecified site of left female breast: Secondary | ICD-10-CM

## 2017-02-02 DIAGNOSIS — Z9049 Acquired absence of other specified parts of digestive tract: Secondary | ICD-10-CM | POA: Insufficient documentation

## 2017-02-02 DIAGNOSIS — R161 Splenomegaly, not elsewhere classified: Secondary | ICD-10-CM | POA: Diagnosis not present

## 2017-02-02 LAB — GLUCOSE, CAPILLARY: Glucose-Capillary: 105 mg/dL — ABNORMAL HIGH (ref 65–99)

## 2017-02-02 MED ORDER — FLUDEOXYGLUCOSE F - 18 (FDG) INJECTION
9.9100 | Freq: Once | INTRAVENOUS | Status: AC | PRN
  Administered 2017-02-02: 9.91 via INTRAVENOUS

## 2017-02-02 MED FILL — PANTOPRAZOLE SOD DR 40 MG T: 40 | 30 days supply | Qty: 30 | Fill #2

## 2017-02-02 MED FILL — traZODone HCL 100 MG TABS: 100 | 30 days supply | Qty: 60 | Fill #1

## 2017-02-02 MED FILL — SERTRALINE HCL 100 MG TAB: 100 | 30 days supply | Qty: 30 | Fill #2

## 2017-02-04 IMAGING — MR MR LUMBAR SPINE WO/W CM
4 of 7 series · 18 of 48 positions shown · IV contrast (Yes)
Comparison: PET-CT 04/19/2015 and 12/22/2014. Lumbar MRI
08/20/2012.

CLINICAL DATA: Low back pain radiating into the right hip and leg
for several years. Metastatic breast cancer. No previous relevant
surgery. Initial encounter.

EXAM:
MRI LUMBAR SPINE WITHOUT AND WITH CONTRAST
TECHNIQUE: Multiplanar and multiecho pulse sequences of the lumbar spine were
obtained without and with intravenous contrast.
CONTRAST:  14mL MULTIHANCE GADOBENATE DIMEGLUMINE 529 MG/ML IV SOLN

[Series 3: T1 · sagittal · 4.0mm · 0.55mm/px · 3 of 14 slices shown (1 of 2)]
[im 1/14]
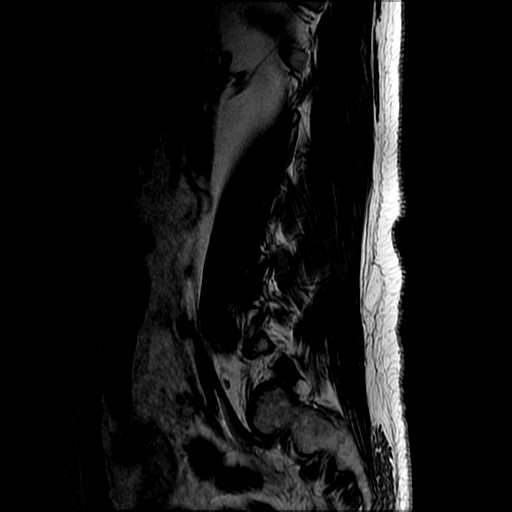
[im 7/14]
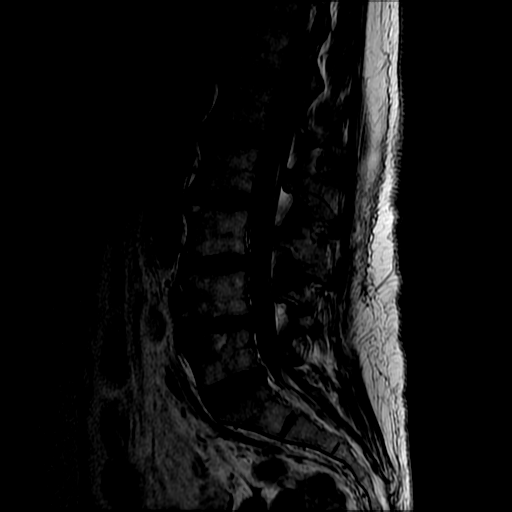
[im 14/14]
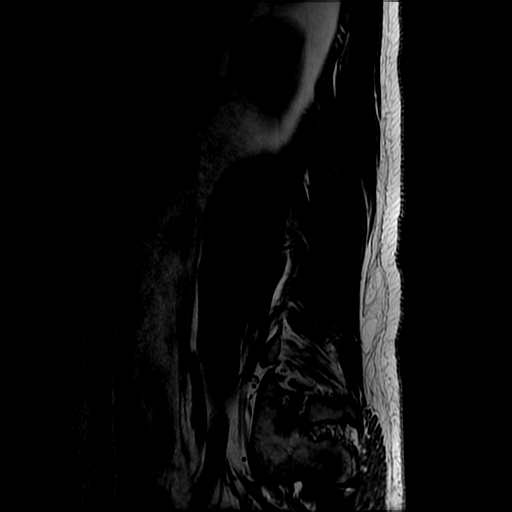

[Series 5: T2 · axial · 4.0mm · 0.39mm/px · z∈[-69,+100]mm · 8 of 32 slices shown]
[im 1/32]
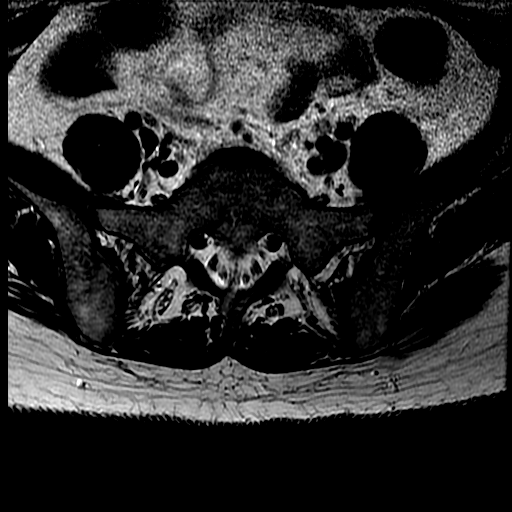
[im 4/32]
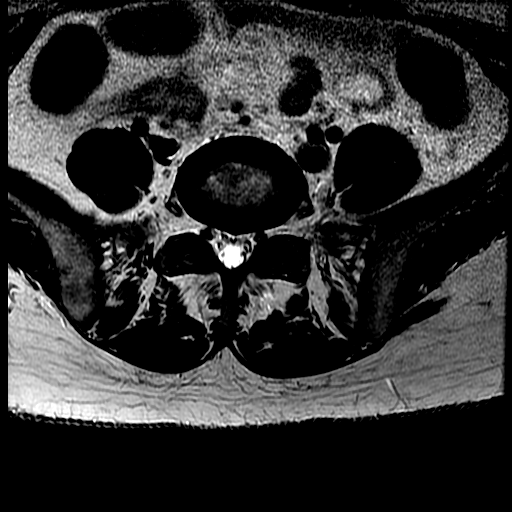
[im 11/32]
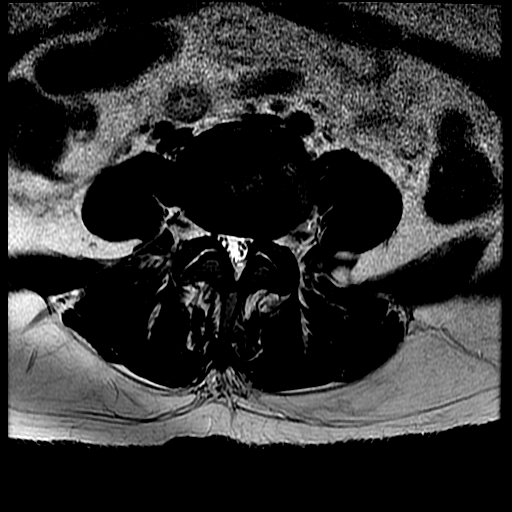
[im 14/32]
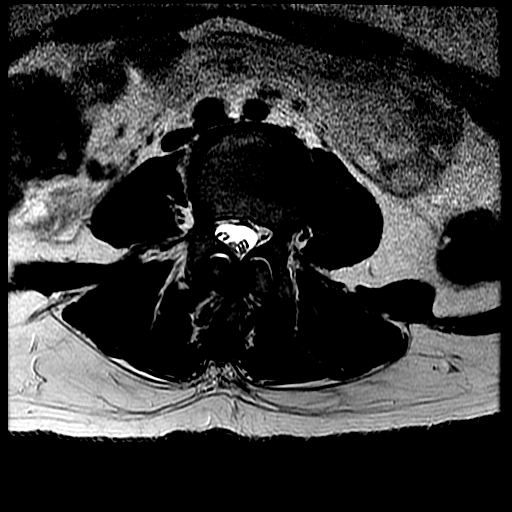
[im 18/32]
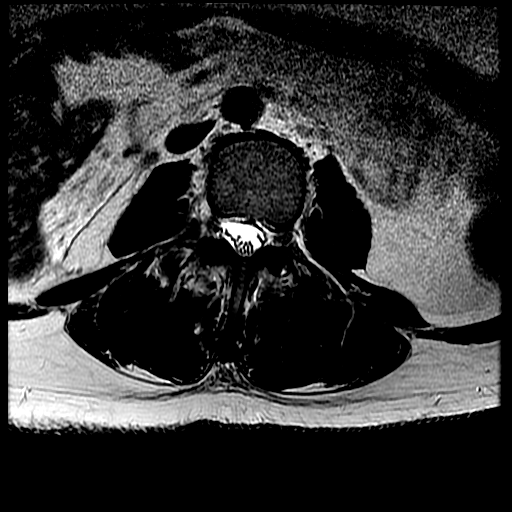
[im 21/32]
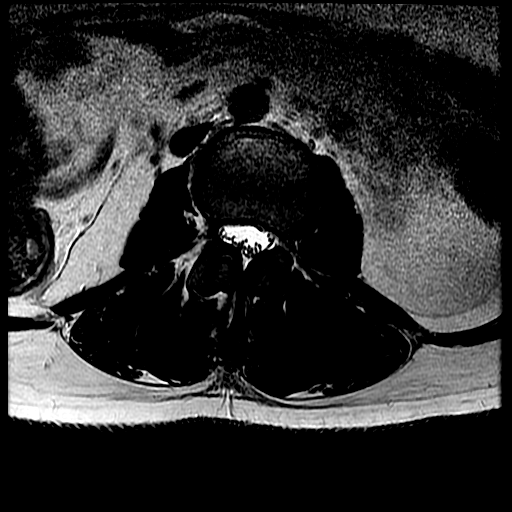
[im 28/32]
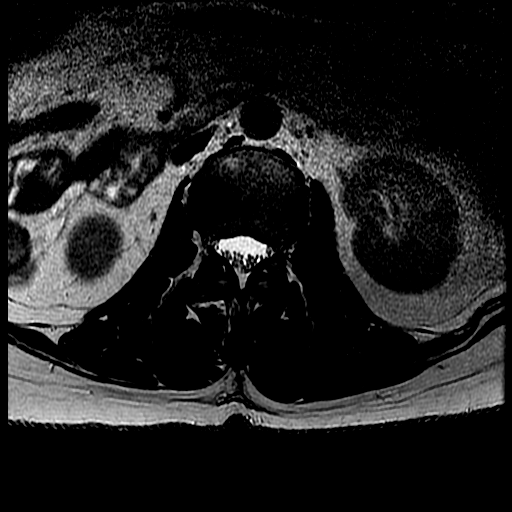
[im 32/32]
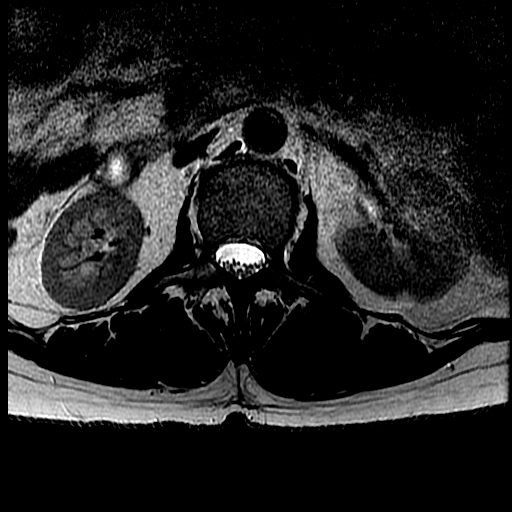

[Series 6: T1 · axial · 4.0mm · 0.39mm/px · z∈[-55,+80]mm · 3 of 32 slices shown (2 of 2)]
[im 4/32]
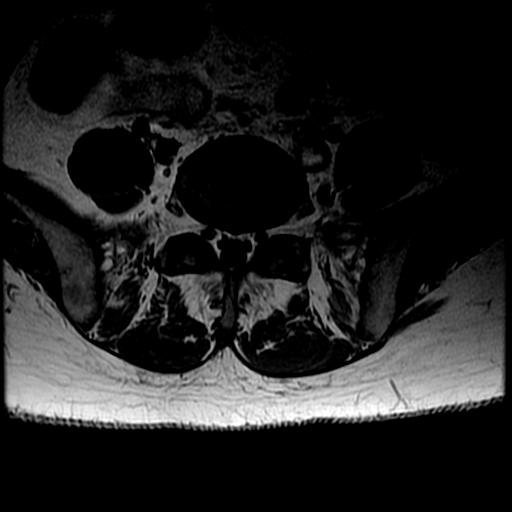
[im 18/32]
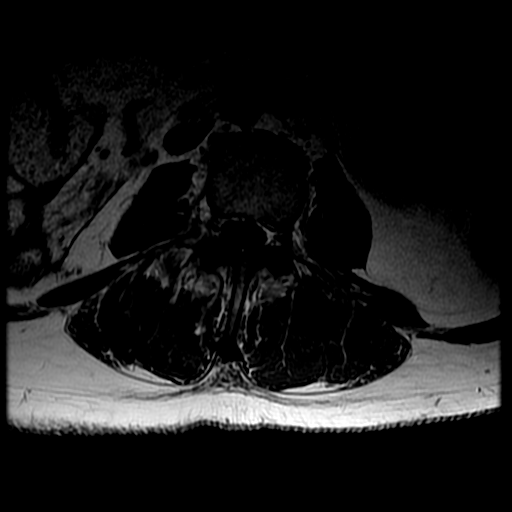
[im 28/32]
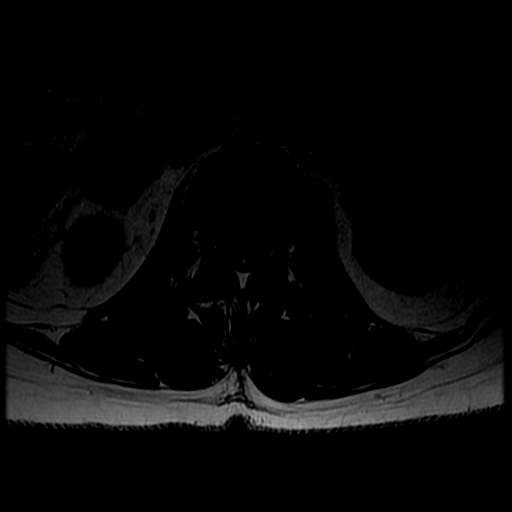

[Series 7: T2 post-contrast · sagittal · 4.0mm · 0.55mm/px · 4 of 14 slices shown]
[im 1/14]
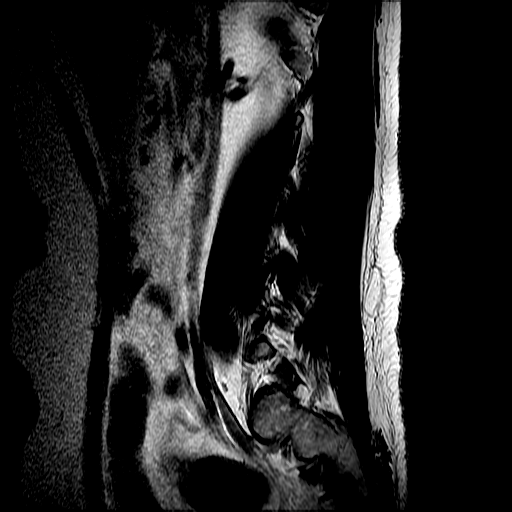
[im 5/14]
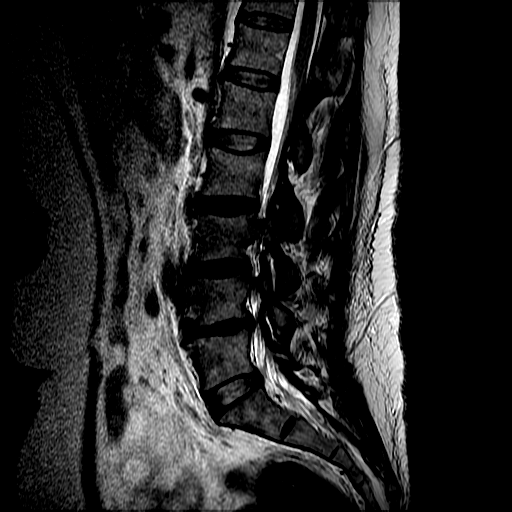
[im 9/14]
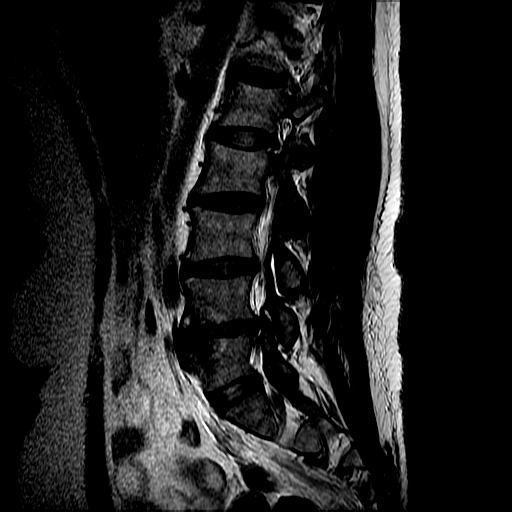
[im 14/14]
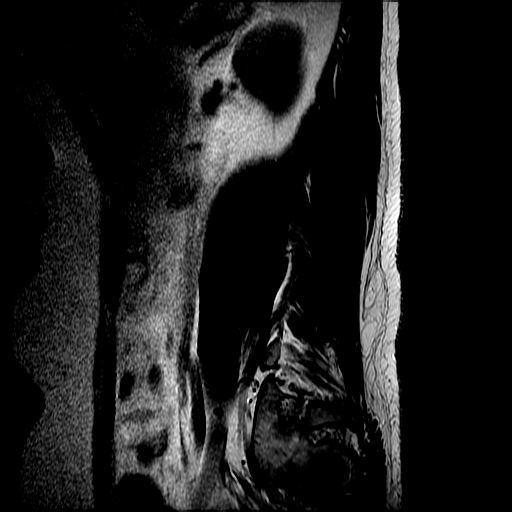

[18 of 48 positions shown; findings below may reference images not displayed]

FINDINGS: Five lumbar type vertebral bodies are assumed. There is a stable
mild convex left scoliosis. There are progressive endplate
degenerative changes anteriorly at L4-5 compared with the prior MRI.
No metastases are identified within the lumbar spine. There is no
evidence of acute fracture, pars defect or suspicious marrow
enhancement.

The conus medullaris extends to the L1 level and appears normal.
Thin filum lipoma appears unchanged. There is no abnormal intradural
enhancement or paraspinal abnormality.

There are no significant disc space findings from T11-12 through
L1-2. Mild asymmetric disc bulging on the left at L1-2 is unchanged.

L2-3: Stable annular disc bulging, facet and ligamentous hypertrophy
contributing to mild triangulation of the thecal sac and mild
narrowing of the lateral recesses. No foraminal compromise or nerve
root encroachment.

L3-4: Stable annular disc bulging. There is mildly progressive facet
and ligamentous hypertrophy with small bilateral facet joint
effusions contributing to mild spinal stenosis which appears
unchanged. There is stable mild narrowing of the lateral recesses.
The foramina are sufficiently patent.

L4-5: There is stable annular disc bulging with loss of disc height
and progressive endplate degeneration anteriorly. There is
asymmetric facet and ligamentous hypertrophy on the left
contributing to stable mild to moderate multifactorial spinal
stenosis. There is stable left greater than right lateral recess
narrowing. The foramina appear sufficiently patent.

L5-S1: Disc height and hydration are maintained. Mild bilateral
facet hypertrophy. No spinal stenosis or nerve root encroachment.
IMPRESSION: 1. No acute findings or evidence of metastatic disease within the
lumbar spine.
2. Mildly progressive endplate degeneration anteriorly at L4-5.
Annular disc bulging, facet and ligamentous hypertrophy at that
level contribute to stable mild to moderate multifactorial spinal
stenosis with left greater than right lateral recess narrowing.
3. Mildly progressive facet and ligamentous hypertrophy at L3-4 with
stable mild spinal stenosis and lateral recess narrowing.

## 2017-02-08 NOTE — Progress Notes (Signed)
Has armband been applied?  No.  Does patient have an allergy to IV contrast dye?: No.   Has patient ever received premedication for IV contrast dye?: No.   Does patient take metformin?: No.  If patient does take metformin when was the last dose: N/A  Date of lab work: 01/11/2017 BUN: 10 CR: 1.2  IV site: Power port a cath,Left subclavian accessed  X 1 attempt, 22 g 1 inch huber needle, excellent blood return, flushed with Normal saline 10 ml, , op site placed over site, patient tolerated well, patient to c BP 131/90 (BP Location: Right Arm, Patient Position: Sitting, Cuff Size: Normal)   Pulse 86   Temp 98.4 F (36.9 C) (Oral)   Resp 18   LMP 06/09/2014 ome back here to deacess her port,patient gave verbal understanding,  Has IV site been added to flowsheet?   Called [facility] asked if they could use patient' power port for the MRI before I accessed it, 'Yes, but she will ned to come back to use to deaccess the port, thanked the secretary 8:10 AM

## 2017-02-09 ENCOUNTER — Other Ambulatory Visit: Payer: 59

## 2017-02-09 ENCOUNTER — Ambulatory Visit
Admission: RE | Admit: 2017-02-09 | Discharge: 2017-02-09 | Disposition: A | Discharge status: Home / Self Care | Payer: 59 | Source: Ambulatory Visit | Attending: Radiation Oncology | Admitting: Radiation Oncology

## 2017-02-09 ENCOUNTER — Telehealth: Payer: Self-pay | Admitting: Hematology & Oncology

## 2017-02-09 VITALS — BP 131/90 | HR 86 | Temp 98.4°F | Resp 18

## 2017-02-09 DIAGNOSIS — Z79899 Other long term (current) drug therapy: Secondary | ICD-10-CM | POA: Diagnosis not present

## 2017-02-09 DIAGNOSIS — C7931 Secondary malignant neoplasm of brain: Secondary | ICD-10-CM

## 2017-02-09 DIAGNOSIS — C787 Secondary malignant neoplasm of liver and intrahepatic bile duct: Secondary | ICD-10-CM | POA: Diagnosis not present

## 2017-02-09 DIAGNOSIS — C50911 Malignant neoplasm of unspecified site of right female breast: Secondary | ICD-10-CM | POA: Diagnosis not present

## 2017-02-09 DIAGNOSIS — Z7982 Long term (current) use of aspirin: Secondary | ICD-10-CM | POA: Diagnosis not present

## 2017-02-09 DIAGNOSIS — Z510 Encounter for antineoplastic radiation therapy: Secondary | ICD-10-CM | POA: Diagnosis not present

## 2017-02-09 DIAGNOSIS — C50919 Malignant neoplasm of unspecified site of unspecified female breast: Secondary | ICD-10-CM | POA: Diagnosis not present

## 2017-02-09 DIAGNOSIS — Z170 Estrogen receptor positive status [ER+]: Secondary | ICD-10-CM | POA: Diagnosis not present

## 2017-02-09 MED ORDER — SODIUM CHLORIDE 0.9% FLUSH
10.0000 mL | Freq: Once | INTRAVENOUS | Status: AC
  Administered 2017-02-09: 10 mL via INTRAVENOUS

## 2017-02-09 MED ORDER — HEPARIN SOD (PORK) LOCK FLUSH 100 UNIT/ML IV SOLN
500.0000 [IU] | Freq: Once | INTRAVENOUS | Status: AC
  Administered 2017-02-09: 500 [IU] via INTRAVENOUS

## 2017-02-09 MED ORDER — GADOBENATE DIMEGLUMINE 529 MG/ML IV SOLN
20.0000 mL | Freq: Once | INTRAVENOUS | Status: AC | PRN
  Administered 2017-02-09: 18 mL via INTRAVENOUS

## 2017-02-09 NOTE — Telephone Encounter (Signed)
Our office has tried to contact patient several times to her schedule for the Echo with no return calls. I have removed the order out of our workque. Please see notes below.  02/09/2017 [facility] for pt to call office and schedule echo appt. sending Dr [name] a message letting them know we can not reach pt. stpegram   01/10/2017 [facility] for pt to call and schedule Echo appt. stpegram  11/30/2016 [facility]OM  FOR PT TO CALL OUR OFFICE TO SCHEDULE  ECHO APPT.  [facility]  Davy Pique

## 2017-02-12 ENCOUNTER — Encounter: Payer: Self-pay | Admitting: *Deleted

## 2017-02-14 ENCOUNTER — Telehealth: Payer: Self-pay | Admitting: Radiation Oncology

## 2017-02-14 ENCOUNTER — Ambulatory Visit: Admission: RE | Admit: 2017-02-14 | Payer: 59 | Source: Ambulatory Visit | Admitting: Radiation Oncology

## 2017-02-14 NOTE — Telephone Encounter (Signed)
Since the patient was just seen for a 1 month visit after radiotherapy to the liver, I called her to review her results of her MRI. She is feeling a bit "yucky" today and thinks she's getting a virus or URI. She denies any fevers. I reviewed her MRI of the brain and the recommendations that she have a repeat MRI of the brain in 6 months time. She will see Dr. Sherwood Gambler in 6 months to review this, and we will follow up with her as needed. Her next visit with Dr. Isidore Moos will be 1 year from now after an MRI.

## 2017-02-15 ENCOUNTER — Ambulatory Visit: Payer: 59 | Admitting: Hematology & Oncology

## 2017-02-15 ENCOUNTER — Ambulatory Visit: Payer: 59

## 2017-02-15 ENCOUNTER — Other Ambulatory Visit: Payer: 59

## 2017-02-19 DIAGNOSIS — J9601 Acute respiratory failure with hypoxia: Secondary | ICD-10-CM | POA: Diagnosis not present

## 2017-02-22 ENCOUNTER — Other Ambulatory Visit: Payer: Self-pay | Admitting: *Deleted

## 2017-02-22 ENCOUNTER — Ambulatory Visit: Payer: 59

## 2017-02-22 ENCOUNTER — Ambulatory Visit (HOSPITAL_BASED_OUTPATIENT_CLINIC_OR_DEPARTMENT_OTHER): Payer: 59

## 2017-02-22 ENCOUNTER — Ambulatory Visit (HOSPITAL_BASED_OUTPATIENT_CLINIC_OR_DEPARTMENT_OTHER): Payer: 59 | Admitting: Hematology & Oncology

## 2017-02-22 ENCOUNTER — Other Ambulatory Visit (HOSPITAL_BASED_OUTPATIENT_CLINIC_OR_DEPARTMENT_OTHER): Payer: 59

## 2017-02-22 VITALS — BP 108/55 | HR 75 | Temp 98.3°F | Resp 16 | Wt 205.0 lb

## 2017-02-22 DIAGNOSIS — C50911 Malignant neoplasm of unspecified site of right female breast: Secondary | ICD-10-CM

## 2017-02-22 DIAGNOSIS — R32 Unspecified urinary incontinence: Secondary | ICD-10-CM

## 2017-02-22 DIAGNOSIS — R350 Frequency of micturition: Secondary | ICD-10-CM

## 2017-02-22 DIAGNOSIS — R5383 Other fatigue: Secondary | ICD-10-CM | POA: Diagnosis not present

## 2017-02-22 DIAGNOSIS — C7931 Secondary malignant neoplasm of brain: Secondary | ICD-10-CM

## 2017-02-22 DIAGNOSIS — C50912 Malignant neoplasm of unspecified site of left female breast: Secondary | ICD-10-CM

## 2017-02-22 DIAGNOSIS — R64 Cachexia: Secondary | ICD-10-CM

## 2017-02-22 DIAGNOSIS — G4701 Insomnia due to medical condition: Secondary | ICD-10-CM

## 2017-02-22 DIAGNOSIS — Z170 Estrogen receptor positive status [ER+]: Secondary | ICD-10-CM

## 2017-02-22 DIAGNOSIS — Z95828 Presence of other vascular implants and grafts: Secondary | ICD-10-CM

## 2017-02-22 DIAGNOSIS — M810 Age-related osteoporosis without current pathological fracture: Secondary | ICD-10-CM | POA: Diagnosis not present

## 2017-02-22 LAB — IRON AND TIBC
%SAT: 29 % (ref 21–57)
Iron: 64 ug/dL (ref 41–142)
TIBC: 216 ug/dL — AB (ref 236–444)
UIBC: 153 ug/dL (ref 120–384)

## 2017-02-22 LAB — CBC WITH DIFFERENTIAL (CANCER CENTER ONLY)
BASO#: 0 10*3/uL (ref 0.0–0.2)
BASO%: 0.6 % (ref 0.0–2.0)
EOS%: 2.2 % (ref 0.0–7.0)
Eosinophils Absolute: 0 10*3/uL (ref 0.0–0.5)
HEMATOCRIT: 34 % — AB (ref 34.8–46.6)
HGB: 10.9 g/dL — ABNORMAL LOW (ref 11.6–15.9)
LYMPH#: 0.4 10*3/uL — AB (ref 0.9–3.3)
LYMPH%: 21 % (ref 14.0–48.0)
MCH: 29.5 pg (ref 26.0–34.0)
MCHC: 32.1 g/dL (ref 32.0–36.0)
MCV: 92 fL (ref 81–101)
MONO#: 0.2 10*3/uL (ref 0.1–0.9)
MONO%: 12.7 % (ref 0.0–13.0)
NEUT%: 63.5 % (ref 39.6–80.0)
NEUTROS ABS: 1.2 10*3/uL — AB (ref 1.5–6.5)
Platelets: 59 10*3/uL — ABNORMAL LOW (ref 145–400)
RBC: 3.7 10*6/uL (ref 3.70–5.32)
RDW: 15.4 % (ref 11.1–15.7)
WBC: 1.8 10*3/uL — AB (ref 3.9–10.0)

## 2017-02-22 LAB — CMP (CANCER CENTER ONLY)
ALT(SGPT): 20 U/L (ref 10–47)
AST: 31 U/L (ref 11–38)
Albumin: 3.4 g/dL (ref 3.3–5.5)
Alkaline Phosphatase: 180 U/L — ABNORMAL HIGH (ref 26–84)
BILIRUBIN TOTAL: 0.7 mg/dL (ref 0.20–1.60)
BUN, Bld: 5 mg/dL — ABNORMAL LOW (ref 7–22)
CALCIUM: 8.6 mg/dL (ref 8.0–10.3)
CHLORIDE: 107 meq/L (ref 98–108)
CO2: 29 meq/L (ref 18–33)
Creat: 0.9 mg/dl (ref 0.6–1.2)
GLUCOSE: 107 mg/dL (ref 73–118)
Potassium: 3.7 mEq/L (ref 3.3–4.7)
Sodium: 142 mEq/L (ref 128–145)
Total Protein: 7.4 g/dL (ref 6.4–8.1)

## 2017-02-22 LAB — URINALYSIS, MICROSCOPIC (CHCC SATELLITE)
BLOOD: NEGATIVE
Bilirubin (Urine): NEGATIVE
GLUCOSE UR: NEGATIVE mg/dL
KETONES: NEGATIVE mg/dL
Nitrite: NEGATIVE
PH: 6.5 (ref 4.60–8.00)
PROTEIN: NEGATIVE mg/dL
RBC: NEGATIVE (ref 0–?)
SPECIFIC GRAVITY, URINE: 1.015 (ref 1.003–1.035)
UROBILINOGEN UR: 0.2 mg/dL (ref 0.2–1)

## 2017-02-22 LAB — FERRITIN: Ferritin: 259 ng/ml (ref 9–269)

## 2017-02-22 MED ORDER — ABEMACICLIB 150 MG PO TABS: 150.0000 mg | ORAL_TABLET | Freq: Two times a day (BID) | ORAL | 4 refills | Status: DC

## 2017-02-22 MED ORDER — ZOLEDRONIC ACID 4 MG/100ML IV SOLN
4.0000 mg | Freq: Once | INTRAVENOUS | Status: AC
  Administered 2017-02-22: 4 mg via INTRAVENOUS
  Filled 2017-02-22: qty 100

## 2017-02-22 MED ORDER — FENTANYL 100 MCG/HR TD PT72: 100.0000 ug | MEDICATED_PATCH | TRANSDERMAL | 0 refills | Status: DC

## 2017-02-22 MED ORDER — SODIUM CHLORIDE 0.9 % IV SOLN
1000.0000 mL | INTRAVENOUS | Status: DC
  Administered 2017-02-22: 11:00:00 via INTRAVENOUS

## 2017-02-22 MED ORDER — HEPARIN SOD (PORK) LOCK FLUSH 100 UNIT/ML IV SOLN
500.0000 [IU] | Freq: Once | INTRAVENOUS | Status: AC | PRN
  Administered 2017-02-22: 500 [IU] via INTRAVENOUS
  Filled 2017-02-22: qty 5

## 2017-02-22 MED ORDER — SODIUM CHLORIDE 0.9 % IJ SOLN
10.0000 mL | INTRAMUSCULAR | Status: DC | PRN
  Administered 2017-02-22: 10 mL via INTRAVENOUS
  Filled 2017-02-22: qty 10

## 2017-02-22 MED FILL — ALPRAZolam 1 MG TABS: 1 | 30 days supply | Qty: 60 | Fill #1

## 2017-02-22 MED FILL — MIRTAZAPINE 30 MG TABLET: 30 | 30 days supply | Qty: 30 | Fill #2

## 2017-02-22 MED FILL — ONDANSETRON HCL 8 MG TABLET: 8 | 20 days supply | Qty: 60 | Fill #1

## 2017-02-22 NOTE — Patient Instructions (Signed)

## 2017-02-22 NOTE — Progress Notes (Signed)
Hematology and Oncology Follow Up Visit  Bianca Kent [id number] Oct 15, 1966 51 y.o. 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 She is having some problems. She's having some mouth issues again. I still am not sure as to why she has these.  She has been on the Versenio for about a month. She has had no nausea or vomiting. She has had no cough. She is still on oxygen. Her breathing is about the same.  She's had no bleeding. She's had no rashes.  Overall, I would think that her pain is doing pretty well. She has had no worsening pain in the back or abdomen. She has some chronic back issues which might be from osteoarthritis.  Her last CA 27.29 was 50.  She's had no fever.  According to her sister, she still is not eating all that much.   Overall, her performance status is ECOG 1-2.   Medications:  Current Outpatient Prescriptions:  .  Abemaciclib (VERZENIO) 200 MG TABS, Take 200 mg by mouth 2 (two) times daily with a meal., Disp: 60 tablet, Rfl: 4 .  albuterol (PROVENTIL HFA;VENTOLIN HFA) 108 (90 BASE) MCG/ACT inhaler, Inhale 2 puffs into the lungs every 4 (four) hours as needed for wheezing or shortness of breath., Disp: 2 Inhaler, Rfl: 6 .  ALPRAZolam (XANAX) 1 MG tablet, Take 1 tablet (1 mg total) by mouth 2 (two) times daily as needed for anxiety., Disp: 60 tablet, Rfl: 2 .  aspirin (ASPIRIN EC) 81 MG EC tablet, Take 81 mg by mouth daily. Swallow whole., Disp: , Rfl:  .  diphenhydrAMINE (SOMINEX) 25 MG tablet, Take 100 mg by mouth at bedtime as  needed for sleep. Pt states she is taking up to 200 mg Benadryl at night and still not sleeping, Disp: , Rfl:  .  dronabinol (MARINOL) 10 MG capsule, Take 1 capsule (10 mg total) by mouth 3 (three) times daily before meals., Disp: 90 capsule, Rfl: 0 .  ergocalciferol (VITAMIN D2) 50000 units capsule, Take 1 capsule (50,000 Units total) by mouth 2 (two) times a week., Disp: 8 capsule, Rfl: 6 .  fentaNYL (DURAGESIC - DOSED MCG/HR) 100 MCG/HR, Place 1 patch (100 mcg total) onto the skin every other day., Disp: 15 patch, Rfl: 0 .  HYDROmorphone (DILAUDID) 4 MG tablet, Take 1 tablet (4 mg total) by mouth every 4 (four) hours as needed for severe pain., Disp: 90 tablet, Rfl: 0 .  Lactulose SOLN, Take 2 TBLSP every 6 hours until bowel movement, Disp: 1000 mL, Rfl: 4 .  lidocaine-prilocaine (EMLA) cream, Apply 1 application topically as needed., Disp: 30 g, Rfl: 6 .  loratadine-pseudoephedrine (CLARITIN-D 24-HOUR) 10-240 MG per 24 hr tablet, Take 1 tablet by mouth daily., Disp: , Rfl:  .  methylphenidate (RITALIN) 10 MG tablet, Take 1 tablet (10 mg total) by mouth 2 (two) times daily., Disp: 60 tablet, Rfl: 0 .  mirtazapine (  REMERON) 30 MG tablet, TAKE 1 TABLET (30 MG TOTAL) BY MOUTH AT BEDTIME., Disp: 30 tablet, Rfl: 3 .  Multiple Vitamin (MULTIVITAMIN) tablet, Take 1 tablet by mouth daily., Disp: , Rfl:  .  Nystatin POWD, 1 application by Does not apply route 2 (two) times daily., Disp: 1 Bottle, Rfl: 3 .  ondansetron (ZOFRAN) 8 MG tablet, Take 1 tablet (8 mg total) by mouth every 8 (eight) hours as needed for nausea or vomiting., Disp: 60 tablet, Rfl: 2 .  pantoprazole (PROTONIX) 40 MG tablet, Take 1 tablet (40 mg total) by mouth daily., Disp: 30 tablet, Rfl: 6 .  promethazine (PHENERGAN) 25 MG suppository, Place 1 suppository (25 mg total) rectally every 6 (six) hours as needed for nausea or vomiting., Disp: 60 each, Rfl: 3 .  Sennosides-Docusate Sodium (SENNA-S PO), Take 1 tablet by mouth as needed.,  Disp: , Rfl:  .  sertraline (ZOLOFT) 100 MG tablet, Take 1 tablet (100 mg total) by mouth daily., Disp: 30 tablet, Rfl: 6 .  traZODone (DESYREL) 100 MG tablet, TAKE 1 TO 2 TABLETS BY MOUTH AS NEEDED FOR SLEEP, Disp: 60 tablet, Rfl: 2 No current facility-administered medications for this visit.   Facility-Administered Medications Ordered in Other Visits:  .  alteplase (CATHFLO ACTIVASE) injection 2 mg, 2 mg, Intracatheter, Once PRN, [name], MD .  HYDROmorphone (DILAUDID) injection 2 mg, 2 mg, Intravenous, Q4H PRN, [name], MD, 2 mg at 04/20/16 1233 .  sodium chloride 0.9 % injection 10 mL, 10 mL, Intravenous, PRN, [name], MD, 10 mL at 01/11/17 1333  Allergies:  Allergies  Allergen Reactions  . Iohexol      Code: RASH, Desc: VERY STRONG FAMILY HX OF ANGIOEDEMA WHEN RECEIVING IV CONTRAST; PT HAS BEEN PREMEDICATED FOR OTHER CONTRASTED STUDIES(IN CATH. LAB)  KR, Onset Date: [id number]   . Prednisone Itching    Capillary beds bust  . Tetanus Toxoids Other (See Comments)    Ran a high fever for 48 hours  . Theophyllines Hives    Mental changes  . Versed [Midazolam] Other (See Comments)    Pt becomes violent    Past Medical History, Surgical history, Social history, and Family History were reviewed and updated.  Review of Systems: As above  Physical Exam:  height is 5\' 7"  (1.702 m) and weight is 202 lb (91.6 kg). Her oral temperature is 98.3 F (36.8 C). Her blood pressure is 98/69 and her pulse is 98. Her respiration is 20.   Well-developed and well-nourished white female. Head and exam shows no ocular or oral lesions. She's able to move both eyes well. She has good extraocular muscle . There is no erythema or exudate from the left eye. There might be some slight fullness in the right axilla by do not detect any obvious adenopathy. I cannot detect any obvious right supraclavicular lymph nodes. She has improved range of motion of the right shoulder. Left  supraclavicular region is okay. No masses are noted. Lungs are clear. Cardiac exam regular rate and rhythm with no murmurs, rubs or bruits.. Abdomen is soft. She has good bowel sounds. There is no palpable liver or spleen tip. Back exam shows decreased tenderness over the lumbar and thoracic spine to palpation. Extremities shows no clubbing, cyanosis or edema. She has 4/5 strength in her legs. Skin exam shows improvement in the radiation dermatitis of the right breast and axilla. There is no open wound. There is some hyper pigmentation. Neurological exam is nonfocal.  Lab Results  Component Value Date   WBC 5.5 01/11/2017   HGB 12.6 01/11/2017   HCT 38.7 01/11/2017   MCV 92 01/11/2017   PLT 72 (L) 01/11/2017     Chemistry      Component Value Date/Time   NA 145 01/11/2017 0817   NA 138 04/30/2015 1019   K 3.8 01/11/2017 0817   K 3.5 04/30/2015 1019   CL 106 01/11/2017 0817   CO2 29 01/11/2017 0817   CO2 29 04/30/2015 1019   BUN 10 01/11/2017 0817   BUN 9.7 04/30/2015 1019   CREATININE 1.2 01/11/2017 0817   CREATININE 0.8 04/30/2015 1019      Component Value Date/Time   CALCIUM 9.0 01/11/2017 0817   CALCIUM 9.7 04/30/2015 1019   ALKPHOS 219 (H) 01/11/2017 0817   ALKPHOS 257 (H) 04/30/2015 1019   AST 41 (H) 01/11/2017 0817   AST 57 (H) 04/30/2015 1019   ALT 33 01/11/2017 0817   ALT 41 04/30/2015 1019   BILITOT 0.90 01/11/2017 0817   BILITOT 0.60 04/30/2015 1019         Impression and Plan: Ms. Gaines is 51 year old white female with triple positive metastatic breast cancer.  I am quite encouraged that her CA 27.29 is only 35. This hopefully is a good indicator that the Versenio is helping.  Her blood counts are down. We will cut the Versenio dose down to 150 mg twice a day. I told her to not take the [place] for about a week or so.  We will give her some IV fluids today.  She says she is also having some urinary incontinence. I'm not sure as to why she would be  having this. We did check a urinalysis on her. This did not show any urinary tract infection.  I cannot find anything neurologically that would be causing this.  Again, maybe decreasing the dose of Versenio might make a difference.  I would like to see her back in a month or so.  [name], MD 3/22/20182:43 PM

## 2017-02-22 NOTE — Patient Instructions (Signed)
Implanted Port Home Guide An implanted port is a type of central line that is placed under the skin. Central lines are used to provide IV access when treatment or nutrition needs to be given through a person's veins. Implanted ports are used for long-term IV access. An implanted port may be placed because:  You need IV medicine that would be irritating to the small veins in your hands or arms.  You need long-term IV medicines, such as antibiotics.  You need IV nutrition for a long period.  You need frequent blood draws for lab tests.  You need dialysis.  Implanted ports are usually placed in the chest area, but they can also be placed in the upper arm, the abdomen, or the leg. An implanted port has two main parts:  Reservoir. The reservoir is round and will appear as a small, raised area under your skin. The reservoir is the part where a needle is inserted to give medicines or draw blood.  Catheter. The catheter is a thin, flexible tube that extends from the reservoir. The catheter is placed into a large vein. Medicine that is inserted into the reservoir goes into the catheter and then into the vein.  How will I care for my incision site? Do not get the incision site wet. Bathe or shower as directed by your health care provider. How is my port accessed? Special steps must be taken to access the port:  Before the port is accessed, a numbing cream can be placed on the skin. This helps numb the skin over the port site.  Your health care provider uses a sterile technique to access the port. ? Your health care provider must put on a mask and sterile gloves. ? The skin over your port is cleaned carefully with an antiseptic and allowed to dry. ? The port is gently pinched between sterile gloves, and a needle is inserted into the port.  Only "non-coring" port needles should be used to access the port. Once the port is accessed, a blood return should be checked. This helps ensure that the port  is in the vein and is not clogged.  If your port needs to remain accessed for a constant infusion, a clear (transparent) bandage will be placed over the needle site. The bandage and needle will need to be changed every week, or as directed by your health care provider.  Keep the bandage covering the needle clean and dry. Do not get it wet. Follow your health care provider's instructions on how to take a shower or bath while the port is accessed.  If your port does not need to stay accessed, no bandage is needed over the port.  What is flushing? Flushing helps keep the port from getting clogged. Follow your health care provider's instructions on how and when to flush the port. Ports are usually flushed with saline solution or a medicine called heparin. The need for flushing will depend on how the port is used.  If the port is used for intermittent medicines or blood draws, the port will need to be flushed: ? After medicines have been given. ? After blood has been drawn. ? As part of routine maintenance.  If a constant infusion is running, the port may not need to be flushed.  How long will my port stay implanted? The port can stay in for as long as your health care provider thinks it is needed. When it is time for the port to come out, surgery will be   done to remove it. The procedure is similar to the one performed when the port was put in. When should I seek immediate medical care? When you have an implanted port, you should seek immediate medical care if:  You notice a bad smell coming from the incision site.  You have swelling, redness, or drainage at the incision site.  You have more swelling or pain at the port site or the surrounding area.  You have a fever that is not controlled with medicine.  This information is not intended to replace advice given to you by your health care provider. Make sure you discuss any questions you have with your health care provider. Document  Released: 10/09/2005 Document Revised: 03/16/2016 Document Reviewed: 06/16/2013 Elsevier Interactive Patient Education  2017 Elsevier Inc.  

## 2017-02-23 ENCOUNTER — Telehealth: Payer: Self-pay | Admitting: *Deleted

## 2017-02-23 LAB — CANCER ANTIGEN 27.29: CA 27.29: 35.3 U/mL (ref 0.0–38.6)

## 2017-02-23 NOTE — Telephone Encounter (Addendum)
Patient's sister aware of results  ----- Message from [name], MD sent at 02/23/2017 10:56 AM EDT ----- Call her sister -- the CA 27.29 is actually mush better!!  The Verzenio is working!!!  We cannot stop it!!  Hopefully the decreased dose will make the difference with her side effects!!!  pete

## 2017-02-26 MED FILL — fentaNYL 100 MCG/HR PT72: 100 | 30 days supply | Qty: 10 | Fill #0

## 2017-02-27 MED FILL — VERZENIO 150 MG TAB: 150 | 28 days supply | Qty: 56 | Fill #0

## 2017-03-07 ENCOUNTER — Other Ambulatory Visit: Payer: Self-pay | Admitting: Hematology & Oncology

## 2017-03-07 DIAGNOSIS — C50919 Malignant neoplasm of unspecified site of unspecified female breast: Secondary | ICD-10-CM

## 2017-03-07 MED FILL — traZODone HCL 100 MG TABS: 100 | 30 days supply | Qty: 60 | Fill #2

## 2017-03-07 MED FILL — SERTRALINE HCL 100 MG TAB: 100 | 30 days supply | Qty: 30 | Fill #3

## 2017-03-07 MED FILL — PANTOPRAZOLE SOD DR 40 MG T: 40 | 30 days supply | Qty: 30 | Fill #0

## 2017-03-13 ENCOUNTER — Other Ambulatory Visit: Payer: Self-pay | Admitting: Radiation Therapy

## 2017-03-13 DIAGNOSIS — C7931 Secondary malignant neoplasm of brain: Secondary | ICD-10-CM

## 2017-03-21 DIAGNOSIS — J9601 Acute respiratory failure with hypoxia: Secondary | ICD-10-CM | POA: Diagnosis not present

## 2017-03-28 ENCOUNTER — Ambulatory Visit (HOSPITAL_BASED_OUTPATIENT_CLINIC_OR_DEPARTMENT_OTHER): Payer: 59 | Admitting: Hematology & Oncology

## 2017-03-28 ENCOUNTER — Ambulatory Visit: Payer: 59

## 2017-03-28 ENCOUNTER — Other Ambulatory Visit (HOSPITAL_BASED_OUTPATIENT_CLINIC_OR_DEPARTMENT_OTHER): Payer: 59

## 2017-03-28 ENCOUNTER — Ambulatory Visit (HOSPITAL_BASED_OUTPATIENT_CLINIC_OR_DEPARTMENT_OTHER): Payer: 59

## 2017-03-28 VITALS — BP 93/62 | HR 87 | Temp 98.5°F | Resp 18 | Wt 195.0 lb

## 2017-03-28 DIAGNOSIS — Z170 Estrogen receptor positive status [ER+]: Secondary | ICD-10-CM

## 2017-03-28 DIAGNOSIS — C50911 Malignant neoplasm of unspecified site of right female breast: Secondary | ICD-10-CM | POA: Diagnosis not present

## 2017-03-28 DIAGNOSIS — M810 Age-related osteoporosis without current pathological fracture: Secondary | ICD-10-CM

## 2017-03-28 DIAGNOSIS — R350 Frequency of micturition: Secondary | ICD-10-CM

## 2017-03-28 DIAGNOSIS — C7931 Secondary malignant neoplasm of brain: Secondary | ICD-10-CM

## 2017-03-28 DIAGNOSIS — Z95828 Presence of other vascular implants and grafts: Secondary | ICD-10-CM

## 2017-03-28 DIAGNOSIS — J9601 Acute respiratory failure with hypoxia: Secondary | ICD-10-CM

## 2017-03-28 LAB — CBC WITH DIFFERENTIAL (CANCER CENTER ONLY)
BASO#: 0 10*3/uL (ref 0.0–0.2)
BASO%: 0.8 % (ref 0.0–2.0)
EOS%: 1.6 % (ref 0.0–7.0)
Eosinophils Absolute: 0 10*3/uL (ref 0.0–0.5)
HCT: 34.2 % — ABNORMAL LOW (ref 34.8–46.6)
HEMOGLOBIN: 11.2 g/dL — AB (ref 11.6–15.9)
LYMPH#: 0.5 10*3/uL — ABNORMAL LOW (ref 0.9–3.3)
LYMPH%: 19.8 % (ref 14.0–48.0)
MCH: 29.6 pg (ref 26.0–34.0)
MCHC: 32.7 g/dL (ref 32.0–36.0)
MCV: 91 fL (ref 81–101)
MONO#: 0.2 10*3/uL (ref 0.1–0.9)
MONO%: 7.8 % (ref 0.0–13.0)
NEUT%: 70 % (ref 39.6–80.0)
NEUTROS ABS: 1.7 10*3/uL (ref 1.5–6.5)
PLATELETS: 58 10*3/uL — AB (ref 145–400)
RBC: 3.78 10*6/uL (ref 3.70–5.32)
RDW: 18.1 % — ABNORMAL HIGH (ref 11.1–15.7)
WBC: 2.4 10*3/uL — AB (ref 3.9–10.0)

## 2017-03-28 LAB — CMP (CANCER CENTER ONLY)
ALBUMIN: 3.7 g/dL (ref 3.3–5.5)
ALK PHOS: 158 U/L — AB (ref 26–84)
ALT: 30 U/L (ref 10–47)
AST: 55 U/L — ABNORMAL HIGH (ref 11–38)
BILIRUBIN TOTAL: 1 mg/dL (ref 0.20–1.60)
BUN: 11 mg/dL (ref 7–22)
CO2: 26 mEq/L (ref 18–33)
CREATININE: 1.1 mg/dL (ref 0.6–1.2)
Calcium: 9 mg/dL (ref 8.0–10.3)
Chloride: 106 mEq/L (ref 98–108)
Glucose, Bld: 97 mg/dL (ref 73–118)
Potassium: 3.2 mEq/L — ABNORMAL LOW (ref 3.3–4.7)
Sodium: 138 mEq/L (ref 128–145)
TOTAL PROTEIN: 8 g/dL (ref 6.4–8.1)

## 2017-03-28 LAB — LACTATE DEHYDROGENASE: LDH: 171 U/L (ref 125–245)

## 2017-03-28 MED ORDER — SODIUM CHLORIDE 0.9% FLUSH
10.0000 mL | INTRAVENOUS | Status: DC | PRN
  Administered 2017-03-28: 10 mL via INTRAVENOUS
  Filled 2017-03-28: qty 10

## 2017-03-28 MED ORDER — ZOLEDRONIC ACID 4 MG/100ML IV SOLN
4.0000 mg | Freq: Once | INTRAVENOUS | Status: AC
  Administered 2017-03-28: 4 mg via INTRAVENOUS
  Filled 2017-03-28: qty 100

## 2017-03-28 MED ORDER — SODIUM CHLORIDE 0.9 % IV SOLN
Freq: Once | INTRAVENOUS | Status: AC
  Administered 2017-03-28: 13:00:00 via INTRAVENOUS

## 2017-03-28 MED ORDER — HEPARIN SOD (PORK) LOCK FLUSH 100 UNIT/ML IV SOLN
500.0000 [IU] | Freq: Once | INTRAVENOUS | Status: AC
  Administered 2017-03-28: 500 [IU] via INTRAVENOUS
  Filled 2017-03-28: qty 5

## 2017-03-28 MED FILL — MIRTAZAPINE 30 MG TABLET: 30 | 30 days supply | Qty: 30 | Fill #3

## 2017-03-28 NOTE — Progress Notes (Signed)
Hematology and Oncology Follow Up Visit  Bianca Kent [id number] 1966/06/30 51 y.o. 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 is back for followup . She is having some problems. I think that the Verzenio probably is causing some issues. Again, we are in a point where we are trying to make sure her quality of life is optimal. Her quality of life definitely is not optimal. I told her that we should hold the Verzenio.  The Versenio clearly is working. Her tumor markers have come down. The CA-27-29 was 35.3. We started Versenio, it was 67.  Hopefully, she will now be able to enjoy the summer. Her sister, who comes with her, wants to be able to take her to their father's house. She also wants to take her up to their other sister in [place].  She does not complain of any mouth pain. She hopefully will be in to eat a little more. She's had a lot of diarrhea. She is really not said this to anybody.  She's doing pretty well with bone pain.  She just wants to be able to have a better quality of life. We will do what we need to do to make sure that she has this.  She still wears oxygen. I may want to get another chest x-ray when we see her back.  Overall, her performance status is ECOG 2.   Medications:  Current Outpatient Prescriptions:  .  Abemaciclib (VERZENIO) 200 MG TABS, Take 200 mg by mouth 2 (two) times daily with a meal., Disp: 60 tablet, Rfl: 4 .  albuterol (PROVENTIL HFA;VENTOLIN HFA) 108 (90 BASE) MCG/ACT inhaler, Inhale 2 puffs into the lungs  every 4 (four) hours as needed for wheezing or shortness of breath., Disp: 2 Inhaler, Rfl: 6 .  ALPRAZolam (XANAX) 1 MG tablet, Take 1 tablet (1 mg total) by mouth 2 (two) times daily as needed for anxiety., Disp: 60 tablet, Rfl: 2 .  aspirin (ASPIRIN EC) 81 MG EC tablet, Take 81 mg by mouth daily. Swallow whole., Disp: , Rfl:  .  diphenhydrAMINE (SOMINEX) 25 MG tablet, Take 100 mg by mouth at bedtime as needed for sleep. Pt states she is taking up to 200 mg Benadryl at night and still not sleeping, Disp: , Rfl:  .  dronabinol (MARINOL) 10 MG capsule, Take 1 capsule (10 mg total) by mouth 3 (three) times daily before meals., Disp: 90 capsule, Rfl: 0 .  ergocalciferol (VITAMIN D2) 50000 units capsule, Take 1 capsule (50,000 Units total) by mouth 2 (two) times a week., Disp: 8 capsule, Rfl: 6 .  fentaNYL (DURAGESIC - DOSED MCG/HR) 100 MCG/HR, Place 1 patch (100 mcg total) onto the skin every other day., Disp: 15 patch, Rfl: 0 .  HYDROmorphone (DILAUDID) 4 MG tablet, Take 1 tablet (4 mg total) by mouth every 4 (four) hours as needed for severe pain., Disp: 90 tablet, Rfl: 0 .  Lactulose SOLN, Take 2 TBLSP every 6 hours until bowel movement, Disp: 1000 mL, Rfl: 4 .  lidocaine-prilocaine (EMLA) cream, Apply 1 application topically as needed., Disp: 30 g, Rfl: 6 .  loratadine-pseudoephedrine (CLARITIN-D 24-HOUR) 10-240 MG per 24 hr tablet, Take 1 tablet by mouth daily., Disp: , Rfl:  .  methylphenidate (RITALIN) 10 MG tablet, Take 1 tablet (10 mg total) by mouth 2 (two) times daily., Disp: 60 tablet, Rfl: 0 .  mirtazapine (REMERON) 30 MG tablet, TAKE 1 TABLET (30 MG TOTAL) BY MOUTH AT BEDTIME., Disp: 30 tablet, Rfl: 3 .  Multiple Vitamin (MULTIVITAMIN) tablet, Take 1 tablet by mouth daily., Disp: , Rfl:  .  Nystatin POWD, 1 application by Does not apply route 2 (two) times daily., Disp: 1 Bottle, Rfl: 3 .  ondansetron (ZOFRAN) 8 MG tablet, Take 1 tablet (8 mg total) by mouth every 8 (eight) hours as needed  for nausea or vomiting., Disp: 60 tablet, Rfl: 2 .  pantoprazole (PROTONIX) 40 MG tablet, Take 1 tablet (40 mg total) by mouth daily., Disp: 30 tablet, Rfl: 6 .  promethazine (PHENERGAN) 25 MG suppository, Place 1 suppository (25 mg total) rectally every 6 (six) hours as needed for nausea or vomiting., Disp: 60 each, Rfl: 3 .  Sennosides-Docusate Sodium (SENNA-S PO), Take 1 tablet by mouth as needed., Disp: , Rfl:  .  sertraline (ZOLOFT) 100 MG tablet, Take 1 tablet (100 mg total) by mouth daily., Disp: 30 tablet, Rfl: 6 .  traZODone (DESYREL) 100 MG tablet, TAKE 1 TO 2 TABLETS BY MOUTH AS NEEDED FOR SLEEP, Disp: 60 tablet, Rfl: 2 No current facility-administered medications for this visit.   Facility-Administered Medications Ordered in Other Visits:  .  alteplase (CATHFLO ACTIVASE) injection 2 mg, 2 mg, Intracatheter, Once PRN, [name], MD .  HYDROmorphone (DILAUDID) injection 2 mg, 2 mg, Intravenous, Q4H PRN, [name], MD, 2 mg at 04/20/16 1233 .  sodium chloride 0.9 % injection 10 mL, 10 mL, Intravenous, PRN, [name], MD, 10 mL at 01/11/17 1333  Allergies:  Allergies  Allergen Reactions  . Iohexol      Code: RASH, Desc: VERY STRONG FAMILY HX OF ANGIOEDEMA WHEN RECEIVING IV CONTRAST; PT HAS BEEN PREMEDICATED FOR OTHER CONTRASTED STUDIES(IN CATH. LAB)  KR, Onset Date: [id number]   . Prednisone Itching    Capillary beds bust  . Tetanus Toxoids Other (See Comments)    Ran a high fever for 48 hours  . Theophyllines Hives    Mental changes  . Versed [Midazolam] Other (See Comments)    Pt becomes violent    Past Medical History, Surgical history, Social history, and Family History were reviewed and updated.  Review of Systems: As above  Physical Exam:  height is 5\' 7"  (1.702 m) and weight is 202 lb (91.6 kg). Her oral temperature is 98.3 F (36.8 C). Her blood pressure is 98/69 and her pulse is 98. Her respiration is 20.   Well-developed and well-nourished white  female. Head and exam shows no ocular or oral lesions. She's able to move both eyes well. She has good extraocular muscle . There is no erythema or exudate from the left eye. There might be some slight fullness in the right axilla by do not detect any obvious adenopathy. I cannot detect any obvious right supraclavicular lymph nodes. She has improved range of motion of the right shoulder. Left supraclavicular region is okay. No masses are noted. Lungs are clear. Cardiac exam regular rate  and rhythm with no murmurs, rubs or bruits.. Abdomen is soft. She has good bowel sounds. There is no palpable liver or spleen tip. Back exam shows decreased tenderness over the lumbar and thoracic spine to palpation. Extremities shows no clubbing, cyanosis or edema. She has 4/5 strength in her legs. Skin exam shows improvement in the radiation dermatitis of the right breast and axilla. There is no open wound. There is some hyper pigmentation. Neurological exam is nonfocal.    Lab Results  Component Value Date   WBC 5.5 01/11/2017   HGB 12.6 01/11/2017   HCT 38.7 01/11/2017   MCV 92 01/11/2017   PLT 72 (L) 01/11/2017     Chemistry      Component Value Date/Time   NA 145 01/11/2017 0817   NA 138 04/30/2015 1019   K 3.8 01/11/2017 0817   K 3.5 04/30/2015 1019   CL 106 01/11/2017 0817   CO2 29 01/11/2017 0817   CO2 29 04/30/2015 1019   BUN 10 01/11/2017 0817   BUN 9.7 04/30/2015 1019   CREATININE 1.2 01/11/2017 0817   CREATININE 0.8 04/30/2015 1019      Component Value Date/Time   CALCIUM 9.0 01/11/2017 0817   CALCIUM 9.7 04/30/2015 1019   ALKPHOS 219 (H) 01/11/2017 0817   ALKPHOS 257 (H) 04/30/2015 1019   AST 41 (H) 01/11/2017 0817   AST 57 (H) 04/30/2015 1019   ALT 33 01/11/2017 0817   ALT 41 04/30/2015 1019   BILITOT 0.90 01/11/2017 0817   BILITOT 0.60 04/30/2015 1019         Impression and Plan: Bianca Kent is 51 year old white female with triple positive metastatic breast cancer.  I am  quite encouraged that her CA 27.29 is only 35. This hopefully is a good indicator that the Verzenio is helping.  We will give her some IV fluids. We will need to make sure that she does not get dehydrated.  She will get her Zometa today.   I don't see that we have to do any scans on her. Her last PET scan was in April. Given that our focus is more quality of life, I'm not sure what a PET scan really will do for [place] right now.   I will like to see her back in about 6 weeks. I want her to be able to go on vacation and be with her family and just enjoyed herself. month or so.  [name], MD 3/22/20182:43 PM

## 2017-03-29 ENCOUNTER — Encounter: Payer: Self-pay | Admitting: *Deleted

## 2017-03-29 LAB — CANCER ANTIGEN 27.29: CA 27.29: 38.2 U/mL (ref 0.0–38.6)

## 2017-04-06 ENCOUNTER — Other Ambulatory Visit: Payer: Self-pay | Admitting: *Deleted

## 2017-04-06 NOTE — Patient Outreach (Signed)
[place] [facility]) Care Management  04/06/2017  Bianca POCH [birth year] [id number]  Subjective: Telephone call to patient's home number, no answer, message states memory full, and unable to leave a message.   Objective: Per chart review, no hospital admission or ED visits within the last year.   Last office visit with oncologist on 03/28/17.  [facility] has been unable to reach patient.   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 Current chemo of Herceptin,  Perjeta ,  Xeloda . Severe nausea, no recent admissions. Status post radiation and prior chemo regimens. Was followed by [facility] CM in 2016, no response.  As of June 2018: No acute admissions, is on PO Verzenio. No response to [facility] CM outreach.    Assessment: Received [facility] Consult from [facility] CM call on 04/05/17 request [facility] Care Management RNCM attempt patient outreach.   [facility] Consult follow up pending patient contact.    Plan: RNCM will call patient for 2nd telephone outreach attempt, transition of care follow up, within 10 business days if no return call.   Egidio Lofgren H. Annia Friendly, BSN, [place] Management [facility] Telephonic CM Phone: [phone] Fax: [phone]

## 2017-04-09 ENCOUNTER — Other Ambulatory Visit: Payer: Self-pay | Admitting: *Deleted

## 2017-04-09 NOTE — Patient Outreach (Signed)
[facility] [place] [facility]) Care Management  04/09/2017  Bianca Kent Jul 15, 1966 [id number]   Subjective: Telephone call to patient's home number, no answer, message states memory full, and unable to leave a message.   Objective: Per chart review, no hospital admission or ED visits within the last year.   Last office visit with oncologist on 03/28/17.  [facility] has been unable to reach patient.   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 Current chemo of Herceptin,  Perjeta ,  Xeloda . Severe nausea, no recent admissions. Status post radiation and prior chemo regimens. Was followed by [facility] CM in 2016, no response.  As of June 2018: No acute admissions, is on PO Verzenio. No response to [facility] CM outreach.    Assessment: Received [facility] Consult from [facility] CM call on 04/05/17 request [facility] Care Management RNCM attempt patient outreach.   [facility] Consult follow up pending patient contact.    Plan: RNCM will call patient for 3rd telephone outreach attempt, transition of care follow up, within 10 business days if no return call.   Blonnie Maske H. Annia Friendly, BSN, [place] Management [facility] Telephonic CM Phone: [phone] Fax: [phone]

## 2017-04-10 ENCOUNTER — Other Ambulatory Visit: Payer: Self-pay | Admitting: *Deleted

## 2017-04-10 ENCOUNTER — Encounter: Payer: Self-pay | Admitting: *Deleted

## 2017-04-10 NOTE — Patient Outreach (Addendum)
[place] [facility]) Care Management  04/10/2017  Bianca Kent [birth year] [id number]   Subjective: Telephone call to patient's home number, no answer, message states memory full, and unable to leave a message. Telephone call to patient's mobile number, no answer, left HIPAA compliant voicemail message, and requested call back.    Objective: Per chart review, no hospital admission or ED visits within the last year. Last office visit with oncologist on 03/28/17. [facility] has been unable to reach patient.  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 Current chemo of Herceptin, Perjeta , Xeloda . Severe nausea, no recent admissions. Status postradiation and prior chemo regimens. Was followed by [facility] CM in 2016, no response. As of June 2018: No acute admissions, is on PO Verzenio. No response to [facility] CM outreach.    Assessment: Received [facility] Consult from [facility] CM call on 04/05/17 request [facility] Care Management RNCM attempt patient outreach. [facility] Consultfollow up pending patient contact.    Plan: RNCM will send unsuccessful outreach  letter, [facility] pamphlet, and proceed with case closure, within 10 business days if no return call.   Migel Hannis H. Annia Friendly, BSN, [place] Management [facility] Telephonic CM Phone: [phone] Fax: [phone]

## 2017-04-18 ENCOUNTER — Other Ambulatory Visit: Payer: Self-pay | Admitting: Hematology & Oncology

## 2017-04-18 ENCOUNTER — Ambulatory Visit (HOSPITAL_BASED_OUTPATIENT_CLINIC_OR_DEPARTMENT_OTHER): Payer: 59 | Admitting: Family

## 2017-04-18 ENCOUNTER — Encounter (HOSPITAL_BASED_OUTPATIENT_CLINIC_OR_DEPARTMENT_OTHER): Payer: Self-pay

## 2017-04-18 ENCOUNTER — Ambulatory Visit (HOSPITAL_BASED_OUTPATIENT_CLINIC_OR_DEPARTMENT_OTHER)
Admission: RE | Admit: 2017-04-18 | Discharge: 2017-04-18 | Disposition: A | Discharge status: Home / Self Care | Payer: 59 | Source: Ambulatory Visit | Attending: Family | Admitting: Family

## 2017-04-18 ENCOUNTER — Ambulatory Visit (HOSPITAL_BASED_OUTPATIENT_CLINIC_OR_DEPARTMENT_OTHER): Payer: 59

## 2017-04-18 ENCOUNTER — Other Ambulatory Visit: Payer: Self-pay | Admitting: *Deleted

## 2017-04-18 ENCOUNTER — Telehealth: Payer: Self-pay | Admitting: *Deleted

## 2017-04-18 VITALS — BP 115/76 | HR 109 | Temp 98.6°F | Resp 18

## 2017-04-18 DIAGNOSIS — M549 Dorsalgia, unspecified: Secondary | ICD-10-CM

## 2017-04-18 DIAGNOSIS — C50912 Malignant neoplasm of unspecified site of left female breast: Secondary | ICD-10-CM

## 2017-04-18 DIAGNOSIS — C7931 Secondary malignant neoplasm of brain: Secondary | ICD-10-CM | POA: Diagnosis not present

## 2017-04-18 DIAGNOSIS — R600 Localized edema: Secondary | ICD-10-CM | POA: Diagnosis not present

## 2017-04-18 DIAGNOSIS — R918 Other nonspecific abnormal finding of lung field: Secondary | ICD-10-CM | POA: Diagnosis not present

## 2017-04-18 DIAGNOSIS — G4701 Insomnia due to medical condition: Secondary | ICD-10-CM

## 2017-04-18 DIAGNOSIS — Z95828 Presence of other vascular implants and grafts: Secondary | ICD-10-CM

## 2017-04-18 DIAGNOSIS — C50911 Malignant neoplasm of unspecified site of right female breast: Secondary | ICD-10-CM

## 2017-04-18 DIAGNOSIS — Z170 Estrogen receptor positive status [ER+]: Secondary | ICD-10-CM

## 2017-04-18 DIAGNOSIS — R5383 Other fatigue: Secondary | ICD-10-CM

## 2017-04-18 DIAGNOSIS — R64 Cachexia: Secondary | ICD-10-CM

## 2017-04-18 DIAGNOSIS — T148XXA Other injury of unspecified body region, initial encounter: Secondary | ICD-10-CM

## 2017-04-18 DIAGNOSIS — R112 Nausea with vomiting, unspecified: Secondary | ICD-10-CM

## 2017-04-18 DIAGNOSIS — M810 Age-related osteoporosis without current pathological fracture: Secondary | ICD-10-CM

## 2017-04-18 MED ORDER — KETOROLAC TROMETHAMINE 15 MG/ML IJ SOLN
30.0000 mg | Freq: Once | INTRAMUSCULAR | Status: AC
  Administered 2017-04-18: 30 mg via INTRAVENOUS

## 2017-04-18 MED ORDER — SODIUM CHLORIDE 0.9 % IJ SOLN
10.0000 mL | INTRAMUSCULAR | Status: DC | PRN
  Administered 2017-04-18: 10 mL via INTRAVENOUS
  Filled 2017-04-18: qty 10

## 2017-04-18 MED ORDER — KETOROLAC TROMETHAMINE 10 MG PO TABS: 10.0000 mg | ORAL_TABLET | Freq: Three times a day (TID) | ORAL | 0 refills | Status: DC | PRN

## 2017-04-18 MED ORDER — SODIUM CHLORIDE 0.9 % IV SOLN
Freq: Once | INTRAVENOUS | Status: AC
  Administered 2017-04-18: 15:00:00 via INTRAVENOUS

## 2017-04-18 MED ORDER — HYDROMORPHONE HCL 4 MG PO TABS: 4.0000 mg | ORAL_TABLET | ORAL | 0 refills | Status: DC | PRN

## 2017-04-18 MED ORDER — KETOROLAC TROMETHAMINE 15 MG/ML IJ SOLN
INTRAMUSCULAR | Status: AC
  Filled 2017-04-18: qty 2

## 2017-04-18 MED ORDER — HYDROMORPHONE HCL 1 MG/ML IJ SOLN
INTRAMUSCULAR | Status: AC
  Filled 2017-04-18: qty 2

## 2017-04-18 MED ORDER — HEPARIN SOD (PORK) LOCK FLUSH 100 UNIT/ML IV SOLN
500.0000 [IU] | Freq: Once | INTRAVENOUS | Status: AC | PRN
  Administered 2017-04-18: 500 [IU] via INTRAVENOUS
  Filled 2017-04-18: qty 5

## 2017-04-18 MED ORDER — HYDROMORPHONE HCL 4 MG/ML IJ SOLN
2.0000 mg | Freq: Once | INTRAMUSCULAR | Status: AC
  Administered 2017-04-18: 2 mg via INTRAVENOUS

## 2017-04-18 MED FILL — HYDROmorphone HCL 4 MG TABS: 4 | 15 days supply | Qty: 90 | Fill #0

## 2017-04-18 MED FILL — SERTRALINE HCL 100 MG TAB: 100 | 30 days supply | Qty: 30 | Fill #4

## 2017-04-18 MED FILL — PANTOPRAZOLE SOD DR 40 MG T: 40 | 90 days supply | Qty: 90 | Fill #1

## 2017-04-18 MED FILL — KETOROLAC 10 MG TABLET: 10 | 10 days supply | Qty: 30 | Fill #0

## 2017-04-18 MED FILL — traZODone HCL 100 MG TABS: 100 | 30 days supply | Qty: 60 | Fill #0

## 2017-04-18 NOTE — Progress Notes (Addendum)
Hematology and Oncology Follow Up Visit  Bianca Kent [id number] 07-19-1966 51 y.o. 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 She has had pain in her right back below the shoulder blade that has progressively gotten more intense. She is tearful and having a hard time moving around without causing a great deal of pain. She has a palpable raised area approximately 1 in x 1 in in size. There is no redness, inflammation, lesion or rash noted. The area is soft to the touch and the patient jumped during the exam due to tenderness. She tried placing a Lidoderm patch over it yesterday but this did not help.  She has her fentanyl patch on and has only been taking her dilaudid 4 mg po daily at bedtime.  She has her supplemental O2 on and states that the pain make her breathing a bit more labored.  No fever, chills, n/v, cough, rash, dizziness, chest pain, palpitations, abdominal pain or changes in bowel or bladder habits.  No lymphadenopathy found on exam. No episodes of bleeding, bruising or petechiae.  No swelling, numbness or tingling in her extremities at this time.  Her appetite comes and goes. She is doing her best to stay hydrated. She is hoping to travel to [place] this weekend to see her other sister.   ECOG Performance Status: 2 - Symptomatic, <50% confined to bed  Medications:  Allergies as of 04/18/2017      Reactions   Iohexol     Code: RASH, Desc: VERY STRONG FAMILY HX OF ANGIOEDEMA WHEN RECEIVING  IV CONTRAST; PT HAS BEEN PREMEDICATED FOR OTHER CONTRASTED STUDIES(IN CATH. LAB)  KR, Onset Date: [id number]   Prednisone Itching   Capillary beds bust   Tetanus Toxoids Other (See Comments)   Ran a high fever for 48 hours   Theophyllines Hives   Mental changes   Versed [midazolam] Other (See Comments)   Pt becomes violent      Medication List       Accurate as of 04/18/17  1:37 PM. Always use your most recent med list.          Abemaciclib 150 MG Tabs Commonly known as:  VERZENIO Take 150 mg by mouth 2 (two) times daily.   albuterol 108 (90 Base) MCG/ACT inhaler Commonly known as:  PROVENTIL HFA;VENTOLIN HFA Inhale 2 puffs into the lungs every 4 (four) hours as needed for wheezing or shortness of breath.   ALPRAZolam 1 MG tablet Commonly known as:  XANAX Take 1 tablet (1 mg total) by mouth 2 (two) times daily as needed for anxiety.   aspirin EC 81 MG EC tablet Generic drug:  aspirin Take 81 mg by mouth daily. Swallow whole.   diphenhydrAMINE 25 MG tablet Commonly known as:  SOMINEX Take 100 mg by mouth at bedtime as needed for sleep. Pt states she is taking up to 200 mg Benadryl at night and still not sleeping   dronabinol 10 MG capsule Commonly  known as:  MARINOL Take 1 capsule (10 mg total) by mouth 3 (three) times daily before meals.   ergocalciferol 50000 units capsule Commonly known as:  VITAMIN D2 Take 1 capsule (50,000 Units total) by mouth 2 (two) times a week.   fentaNYL 100 MCG/HR Commonly known as:  DURAGESIC - dosed mcg/hr Place 1 patch (100 mcg total) onto the skin every other day.   HYDROmorphone 4 MG tablet Commonly known as:  DILAUDID Take 1 tablet (4 mg total) by mouth every 4 (four) hours as needed for severe pain.   Lactulose Soln Take 2 TBLSP every 6 hours until bowel movement   lidocaine-prilocaine cream Commonly known as:  EMLA Apply 1 application topically as needed.   loratadine-pseudoephedrine 10-240 MG 24 hr tablet Commonly known  as:  CLARITIN-D 24-hour Take 1 tablet by mouth daily.   methylphenidate 10 MG tablet Commonly known as:  RITALIN Take 1 tablet (10 mg total) by mouth 2 (two) times daily.   mirtazapine 30 MG tablet Commonly known as:  REMERON TAKE 1 TABLET (30 MG TOTAL) BY MOUTH AT BEDTIME.   MUGARD Liqd Use as directed 5-10 mLs in the mouth or throat every 4 (four) hours as needed.   multivitamin tablet Take 1 tablet by mouth daily.   Nystatin Powd 1 application by Does not apply route 2 (two) times daily.   ondansetron 8 MG tablet Commonly known as:  ZOFRAN Take 1 tablet (8 mg total) by mouth every 8 (eight) hours as needed for nausea or vomiting.   pantoprazole 40 MG tablet Commonly known as:  PROTONIX TAKE 1 TABLET (40 MG TOTAL) BY MOUTH DAILY.   promethazine 25 MG suppository Commonly known as:  PHENERGAN Place 1 suppository (25 mg total) rectally every 6 (six) hours as needed for nausea or vomiting.   SENNA-S PO Take 1 tablet by mouth as needed.   sertraline 100 MG tablet Commonly known as:  ZOLOFT Take 1 tablet (100 mg total) by mouth daily.   traZODone 100 MG tablet Commonly known as:  DESYREL TAKE 1 TO 2 TABLETS BY MOUTH AS NEEDED FOR SLEEP   traZODone 100 MG tablet Commonly known as:  DESYREL TAKE 1 TO 2 TABLETS BY MOUTH AS NEEDED FOR SLEEP       Allergies:  Allergies  Allergen Reactions  . Iohexol      Code: RASH, Desc: VERY STRONG FAMILY HX OF ANGIOEDEMA WHEN RECEIVING IV CONTRAST; PT HAS BEEN PREMEDICATED FOR OTHER CONTRASTED STUDIES(IN CATH. LAB)  KR, Onset Date: [id number]   . Prednisone Itching    Capillary beds bust  . Tetanus Toxoids Other (See Comments)    Ran a high fever for 48 hours  . Theophyllines Hives    Mental changes  . Versed [Midazolam] Other (See Comments)    Pt becomes violent    Past Medical History, Surgical history, Social history, and Family History were reviewed and updated.  Review of Systems: All other 10 point review of systems  is negative.   Physical Exam:  oral temperature is 98.6 F (37 C). Her blood pressure is 115/76 and her pulse is 109 (abnormal). Her respiration is 18 and oxygen saturation is 96%.   Wt Readings from Last 3 Encounters:  03/28/17 195 lb (88.5 kg)  02/22/17 205 lb (93 kg)  01/23/17 201 lb 9.6 oz (91.4 kg)    Ocular: Sclerae unicteric, pupils equal, round and reactive to light Ear-nose-throat: Oropharynx clear, dentition fair Lymphatic: No cervical, supraclavicular or axillary adenopathy  Lungs no rales or rhonchi, good excursion bilaterally Heart regular rate and rhythm, no murmur appreciated Abd soft, nontender, positive bowel sounds, no liver or spleen tip palpated on exam, no fluid wave  MSK no focal spinal tenderness, no joint edema Neuro: non-focal, well-oriented, appropriate affect Breasts: Not performed this visit due to limited mobility and not being able to remove bra  Lab Results  Component Value Date   WBC 2.4 (L) 03/28/2017   HGB 11.2 (L) 03/28/2017   HCT 34.2 (L) 03/28/2017   MCV 91 03/28/2017   PLT 58 (L) 03/28/2017   Lab Results  Component Value Date   FERRITIN 259 02/22/2017   IRON 64 02/22/2017   TIBC 216 (L) 02/22/2017   UIBC 153 02/22/2017   IRONPCTSAT 29 02/22/2017   Lab Results  Component Value Date   RBC 3.78 03/28/2017   No results found for: KPAFRELGTCHN, LAMBDASER, KAPLAMBRATIO No results found for: IGGSERUM, IGA, IGMSERUM No results found for: Kathrynn Ducking, MSPIKE, SPEI   Chemistry      Component Value Date/Time   NA 138 03/28/2017 1057   NA 138 04/30/2015 1019   K 3.2 (L) 03/28/2017 1057   K 3.5 04/30/2015 1019   CL 106 03/28/2017 1057   CO2 26 03/28/2017 1057   CO2 29 04/30/2015 1019   BUN 11 03/28/2017 1057   BUN 9.7 04/30/2015 1019   CREATININE 1.1 03/28/2017 1057   CREATININE 0.8 04/30/2015 1019      Component Value Date/Time   CALCIUM 9.0 03/28/2017 1057   CALCIUM 9.7 04/30/2015  1019   ALKPHOS 158 (H) 03/28/2017 1057   ALKPHOS 257 (H) 04/30/2015 1019   AST 55 (H) 03/28/2017 1057   AST 57 (H) 04/30/2015 1019   ALT 30 03/28/2017 1057   ALT 41 04/30/2015 1019   BILITOT 1.00 03/28/2017 1057   BILITOT 0.60 04/30/2015 1019      Impression and Plan: Bianca Kent is a very pleasant 51 yo caucasian female with triple positive metastatic breast cancer. Her Ca 27.29 earlier this month was 38.2. She is here today with c/o of severe pain in her right shoulder blade that has progressively worsened since last Saturday.  CT scan today showed mild asymmetric enlargement of the right latissimus dorsi muscle indicating possible muscle strain or injury.  She is getting IV fluids and IV pain medication.  We will send her home on Toradol 10 mg PO TID as needed for pain. She will try her best to stay well hydrated and use ice packs as needed.  We will plan to see her back in July for her next MD follow-up and labs.  Both she and her family know to contact our office with any questions or concerns. We can certainly see her sooner if need be.   Eliezer Bottom, NP 6/27/20181:37 PM   ADDENDUM: I saw and examined the patient with Judson Roch. I'm not sure exactly what could be going on with the upper back. We did do the CT scan. It showed some edema in the area of her pain. There is no focal bone lesions. There is no hemorrhage. There is no infection.  I still am puzzled as to what this all might be.  I don't think this is anything related to her Verzenio. We have held this for the past 3 weeks.  I know that her CA 27.29 is creeping up.  She seemed to do better with the IV fluids and IV Toradol. I will  give her some oral Toradol to take at home. I think it is a good idea to try the ice packs.  As always, we had a very good prayer session with her sister. It is always nice that I can see their faith being so strong.  Lattie Haw, MD

## 2017-04-18 NOTE — Telephone Encounter (Signed)
Patient is c/o increased pain in her back under her shoulder. She has tried all her prn medications, lidocaine patches and heat with no relief. At rest the pain is 5/10 and with movement its 10/10. When her sister was applying the patch yesterday, a hard knot was palpated at the site of the pain. They would like to know what to do for pain control and if the knot needs to be assessed.  Reviewed with Dr [name] and he would like patient to be seen by Judson Roch. Appointment made and confirmed with patient's sister.

## 2017-04-18 NOTE — Progress Notes (Signed)
1345  To radiology for CT via [place] with RN.  2:30 PM Return from Radiology.

## 2017-04-18 NOTE — Patient Instructions (Signed)
Muscle Strain A muscle strain (pulled muscle) happens when a muscle is stretched beyond normal length. It happens when a sudden, violent force stretches your muscle too far. Usually, a few of the fibers in your muscle are torn. Muscle strain is common in athletes. Recovery usually takes 1-2 weeks. Complete healing takes 5-6 weeks. Follow these instructions at home:  Follow the PRICE method of treatment to help your injury get better. Do this the first 2-3 days after the injury: ? Protect. Protect the muscle to keep it from getting injured again. ? Rest. Limit your activity and rest the injured body part. ? Ice. Put ice in a plastic bag. Place a towel between your skin and the bag. Then, apply the ice and leave it on from 15-20 minutes each hour. After the third day, switch to moist heat packs. ? Compression. Use a splint or elastic bandage on the injured area for comfort. Do not put it on too tightly. ? Elevate. Keep the injured body part above the level of your heart.  Only take medicine as told by your doctor.  Warm up before doing exercise to prevent future muscle strains. Contact a doctor if:  You have more pain or puffiness (swelling) in the injured area.  You feel numbness, tingling, or notice a loss of strength in the injured area. This information is not intended to replace advice given to you by your health care provider. Make sure you discuss any questions you have with your health care provider. Document Released: 07/18/2008 Document Revised: 03/16/2016 Document Reviewed: 05/08/2013 Elsevier Interactive Patient Education  2017 Elsevier Inc.  

## 2017-04-19 NOTE — Addendum Note (Signed)
Addended by: Burney Gauze R on: 04/19/2017 10:43 AM   Modules accepted: Level of Service

## 2017-04-21 DIAGNOSIS — J9601 Acute respiratory failure with hypoxia: Secondary | ICD-10-CM | POA: Diagnosis not present

## 2017-04-24 ENCOUNTER — Other Ambulatory Visit: Payer: Self-pay | Admitting: *Deleted

## 2017-04-24 NOTE — Patient Outreach (Signed)
[facility]) Care Management  04/24/2017  Bianca Kent December 13, 1965 [id number]    No response from patient outreach attempts will proceed with case closure.   Objective: Per chart review, no hospital admission or ED visits within the last year. Last office visit with oncologist on 03/28/17. [facility] has been unable to reach patient.  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 Current chemo of Herceptin, Perjeta , Xeloda . Severe nausea, no recent admissions. Status postradiation and prior chemo regimens. Was followed by [facility] CM in 2016, no response. As of June 2018: No acute admissions, is on PO Verzenio. No response to [facility] CM outreach.    Assessment: Received [facility] Consult from [facility] CM call on 04/05/17 request [facility] Care Management RNCM attempt patient outreach. [facility] Consultfollow up not completed due to unable to contact patient and will proceed with case closure.     Plan: RNCM will send case closure due to unable to contact request to [facility].     Mclean Moya H. Annia Friendly, BSN, [facility] Management [facility] Telephonic CM Phone: [phone] Fax: [phone]

## 2017-05-04 ENCOUNTER — Ambulatory Visit: Payer: 59 | Admitting: Hematology & Oncology

## 2017-05-04 ENCOUNTER — Other Ambulatory Visit: Payer: 59

## 2017-05-04 ENCOUNTER — Ambulatory Visit: Payer: 59

## 2017-05-10 ENCOUNTER — Other Ambulatory Visit (HOSPITAL_BASED_OUTPATIENT_CLINIC_OR_DEPARTMENT_OTHER): Payer: 59

## 2017-05-10 ENCOUNTER — Ambulatory Visit (HOSPITAL_BASED_OUTPATIENT_CLINIC_OR_DEPARTMENT_OTHER): Payer: 59

## 2017-05-10 ENCOUNTER — Ambulatory Visit: Payer: 59

## 2017-05-10 ENCOUNTER — Other Ambulatory Visit: Payer: Self-pay | Admitting: Hematology & Oncology

## 2017-05-10 ENCOUNTER — Ambulatory Visit (HOSPITAL_BASED_OUTPATIENT_CLINIC_OR_DEPARTMENT_OTHER): Payer: 59 | Admitting: Hematology & Oncology

## 2017-05-10 VITALS — BP 103/57 | HR 85 | Temp 98.3°F | Resp 17 | Wt 194.1 lb

## 2017-05-10 VITALS — BP 103/63 | HR 72

## 2017-05-10 DIAGNOSIS — C7931 Secondary malignant neoplasm of brain: Secondary | ICD-10-CM

## 2017-05-10 DIAGNOSIS — M549 Dorsalgia, unspecified: Secondary | ICD-10-CM | POA: Diagnosis not present

## 2017-05-10 DIAGNOSIS — C50912 Malignant neoplasm of unspecified site of left female breast: Secondary | ICD-10-CM

## 2017-05-10 DIAGNOSIS — J9601 Acute respiratory failure with hypoxia: Secondary | ICD-10-CM | POA: Diagnosis not present

## 2017-05-10 DIAGNOSIS — C50911 Malignant neoplasm of unspecified site of right female breast: Secondary | ICD-10-CM

## 2017-05-10 DIAGNOSIS — Z9981 Dependence on supplemental oxygen: Secondary | ICD-10-CM | POA: Diagnosis not present

## 2017-05-10 DIAGNOSIS — R197 Diarrhea, unspecified: Secondary | ICD-10-CM | POA: Diagnosis not present

## 2017-05-10 DIAGNOSIS — M810 Age-related osteoporosis without current pathological fracture: Secondary | ICD-10-CM

## 2017-05-10 DIAGNOSIS — Z95828 Presence of other vascular implants and grafts: Secondary | ICD-10-CM

## 2017-05-10 DIAGNOSIS — C787 Secondary malignant neoplasm of liver and intrahepatic bile duct: Secondary | ICD-10-CM

## 2017-05-10 DIAGNOSIS — D508 Other iron deficiency anemias: Secondary | ICD-10-CM

## 2017-05-10 LAB — CBC WITH DIFFERENTIAL (CANCER CENTER ONLY)
BASO#: 0 10*3/uL (ref 0.0–0.2)
BASO%: 0.5 % (ref 0.0–2.0)
EOS%: 2.4 % (ref 0.0–7.0)
Eosinophils Absolute: 0.1 10*3/uL (ref 0.0–0.5)
HEMATOCRIT: 34.7 % — AB (ref 34.8–46.6)
HGB: 11.3 g/dL — ABNORMAL LOW (ref 11.6–15.9)
LYMPH#: 0.4 10*3/uL — AB (ref 0.9–3.3)
LYMPH%: 8.4 % — ABNORMAL LOW (ref 14.0–48.0)
MCH: 29.4 pg (ref 26.0–34.0)
MCHC: 32.6 g/dL (ref 32.0–36.0)
MCV: 90 fL (ref 81–101)
MONO#: 0.4 10*3/uL (ref 0.1–0.9)
MONO%: 8.6 % (ref 0.0–13.0)
NEUT%: 80.1 % — AB (ref 39.6–80.0)
NEUTROS ABS: 3.4 10*3/uL (ref 1.5–6.5)
Platelets: 73 10*3/uL — ABNORMAL LOW (ref 145–400)
RBC: 3.84 10*6/uL (ref 3.70–5.32)
RDW: 17.4 % — ABNORMAL HIGH (ref 11.1–15.7)
WBC: 4.2 10*3/uL (ref 3.9–10.0)

## 2017-05-10 LAB — CMP (CANCER CENTER ONLY)
ALBUMIN: 3.8 g/dL (ref 3.3–5.5)
ALK PHOS: 206 U/L — AB (ref 26–84)
ALT: 18 U/L (ref 10–47)
AST: 47 U/L — AB (ref 11–38)
BILIRUBIN TOTAL: 1 mg/dL (ref 0.20–1.60)
BUN, Bld: 8 mg/dL (ref 7–22)
CALCIUM: 8.8 mg/dL (ref 8.0–10.3)
CO2: 29 mEq/L (ref 18–33)
Chloride: 103 mEq/L (ref 98–108)
Creat: 0.8 mg/dl (ref 0.6–1.2)
Glucose, Bld: 101 mg/dL (ref 73–118)
POTASSIUM: 3.2 meq/L — AB (ref 3.3–4.7)
Sodium: 141 mEq/L (ref 128–145)
TOTAL PROTEIN: 7.8 g/dL (ref 6.4–8.1)

## 2017-05-10 MED ORDER — KETOROLAC TROMETHAMINE 15 MG/ML IJ SOLN
30.0000 mg | Freq: Once | INTRAMUSCULAR | Status: AC
  Administered 2017-05-10: 30 mg via INTRAVENOUS

## 2017-05-10 MED ORDER — SODIUM CHLORIDE 0.9 % IJ SOLN
10.0000 mL | INTRAMUSCULAR | Status: DC | PRN
  Administered 2017-05-10: 10 mL via INTRAVENOUS
  Filled 2017-05-10: qty 10

## 2017-05-10 MED ORDER — HEPARIN SOD (PORK) LOCK FLUSH 100 UNIT/ML IV SOLN
500.0000 [IU] | Freq: Once | INTRAVENOUS | Status: AC | PRN
  Administered 2017-05-10: 500 [IU] via INTRAVENOUS
  Filled 2017-05-10: qty 5

## 2017-05-10 MED ORDER — KETOROLAC TROMETHAMINE 15 MG/ML IJ SOLN
INTRAMUSCULAR | Status: AC
  Filled 2017-05-10: qty 2

## 2017-05-10 MED ORDER — CYCLOBENZAPRINE HCL 10 MG PO TABS: 10.0000 mg | ORAL_TABLET | Freq: Three times a day (TID) | ORAL | 1 refills | Status: DC | PRN

## 2017-05-10 MED ORDER — SODIUM CHLORIDE 0.9 % IV SOLN
Freq: Once | INTRAVENOUS | Status: AC
  Administered 2017-05-10: 11:00:00 via INTRAVENOUS

## 2017-05-10 MED FILL — CYCLOBENZAPRINE 10 MG TABLE: 10 | 30 days supply | Qty: 90 | Fill #0

## 2017-05-10 MED FILL — MIRTAZAPINE 30 MG TABLET: 30 | 30 days supply | Qty: 30 | Fill #0

## 2017-05-10 NOTE — Progress Notes (Signed)
Hematology and Oncology Follow Up Visit  Bianca Kent [id number] [birth year] 51 y.o. 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 is back for followup . She is having some problems. I think that the Verzenio probably is causing some issues. Again, we are in a point where we are trying to make sure her quality of life is optimal. Her quality of life definitely is not optimal. I told her that we should hold the Verzenio.  The Versenio clearly was working. Her tumor markers have come down. The CA-27-29 was 38. When we started Versenio, it was 31.  Hopefully, she will be able to enjoy the rest of summer. Her sister, who comes with her, wants to be able to take her to their father's house. She also wants to take her up to their other sister in [place]. Self she also will be going sailing.  She does not complain of any mouth pain. She hopefully will be in to eat a little more. She's had a lot of diarrhea. She is really not said this to anybody.  She's still having this back pain. I'm unsure exactly what it might be. It could be from malignancy. I'll try her on some Flexeril.  She did break a tooth. She saw her dentist for this. I am not sure if this is related to the Zometa. Regardless, I'm stopping the Zometa for right now.  She just wants to be able to have a better quality of life. We will do what we need to do to make sure that she has this.  She still wears oxygen.   She is driving more. She really enjoys driving. It does give her some  "Freedom".  Overall, her performance status is ECOG 2.   Medications:  Current Outpatient Prescriptions:  .  Abemaciclib (VERZENIO) 200 MG TABS, Take 200 mg by mouth 2 (two) times daily with a meal., Disp: 60 tablet, Rfl: 4 .  albuterol (PROVENTIL HFA;VENTOLIN HFA) 108 (90 BASE) MCG/ACT inhaler, Inhale 2 puffs into the lungs every 4 (four) hours as needed for wheezing or shortness of breath., Disp: 2 Inhaler, Rfl: 6 .  ALPRAZolam (XANAX) 1 MG tablet, Take 1 tablet (1 mg total) by mouth 2 (two) times daily as needed for anxiety., Disp: 60 tablet, Rfl: 2 .  aspirin (ASPIRIN EC) 81 MG EC tablet, Take 81 mg by mouth daily. Swallow whole., Disp: , Rfl:  .  diphenhydrAMINE (SOMINEX) 25 MG tablet, Take 100 mg by mouth at bedtime as needed for sleep. Pt states she is taking up to 200 mg Benadryl at night and still not sleeping, Disp: , Rfl:  .  dronabinol (MARINOL) 10 MG capsule, Take 1 capsule (10 mg total) by mouth 3 (three) times daily before meals., Disp: 90 capsule, Rfl: 0 .  ergocalciferol (VITAMIN D2) 50000 units capsule, Take 1 capsule (50,000 Units total) by mouth 2 (two) times a week., Disp:  8 capsule, Rfl: 6 .  fentaNYL (DURAGESIC - DOSED MCG/HR) 100 MCG/HR, Place 1 patch (100 mcg total) onto the skin every other day., Disp: 15 patch, Rfl: 0 .  HYDROmorphone (DILAUDID) 4 MG tablet, Take 1 tablet (4 mg total) by mouth every 4 (four) hours as needed for severe pain., Disp: 90 tablet, Rfl: 0 .  Lactulose SOLN, Take 2 TBLSP every 6 hours until bowel movement, Disp: 1000 mL, Rfl: 4 .  lidocaine-prilocaine (EMLA) cream, Apply 1 application topically as needed., Disp: 30 g, Rfl: 6 .  loratadine-pseudoephedrine (CLARITIN-D 24-HOUR) 10-240 MG per 24 hr tablet, Take 1 tablet by mouth daily., Disp: , Rfl:  .  methylphenidate (RITALIN) 10 MG tablet, Take 1 tablet (10 mg total) by mouth 2 (two) times daily., Disp: 60 tablet, Rfl: 0 .  mirtazapine (REMERON) 30 MG tablet, TAKE 1 TABLET (30 MG TOTAL) BY MOUTH  AT BEDTIME., Disp: 30 tablet, Rfl: 3 .  Multiple Vitamin (MULTIVITAMIN) tablet, Take 1 tablet by mouth daily., Disp: , Rfl:  .  Nystatin POWD, 1 application by Does not apply route 2 (two) times daily., Disp: 1 Bottle, Rfl: 3 .  ondansetron (ZOFRAN) 8 MG tablet, Take 1 tablet (8 mg total) by mouth every 8 (eight) hours as needed for nausea or vomiting., Disp: 60 tablet, Rfl: 2 .  pantoprazole (PROTONIX) 40 MG tablet, Take 1 tablet (40 mg total) by mouth daily., Disp: 30 tablet, Rfl: 6 .  promethazine (PHENERGAN) 25 MG suppository, Place 1 suppository (25 mg total) rectally every 6 (six) hours as needed for nausea or vomiting., Disp: 60 each, Rfl: 3 .  Sennosides-Docusate Sodium (SENNA-S PO), Take 1 tablet by mouth as needed., Disp: , Rfl:  .  sertraline (ZOLOFT) 100 MG tablet, Take 1 tablet (100 mg total) by mouth daily., Disp: 30 tablet, Rfl: 6 .  traZODone (DESYREL) 100 MG tablet, TAKE 1 TO 2 TABLETS BY MOUTH AS NEEDED FOR SLEEP, Disp: 60 tablet, Rfl: 2 No current facility-administered medications for this visit.   Facility-Administered Medications Ordered in Other Visits:  .  alteplase (CATHFLO ACTIVASE) injection 2 mg, 2 mg, Intracatheter, Once PRN, [name], MD .  HYDROmorphone (DILAUDID) injection 2 mg, 2 mg, Intravenous, Q4H PRN, [name], MD, 2 mg at 04/20/16 1233 .  sodium chloride 0.9 % injection 10 mL, 10 mL, Intravenous, PRN, [name], MD, 10 mL at 01/11/17 1333  Allergies:  Allergies  Allergen Reactions  . Iohexol      Code: RASH, Desc: VERY STRONG FAMILY HX OF ANGIOEDEMA WHEN RECEIVING IV CONTRAST; PT HAS BEEN PREMEDICATED FOR OTHER CONTRASTED STUDIES(IN CATH. LAB)  KR, Onset Date: 14481856   . Prednisone Itching    Capillary beds bust  . Tetanus Toxoids Other (See Comments)    Ran a high fever for 48 hours  . Theophyllines Hives    Mental changes  . Versed [Midazolam] Other (See Comments)    Pt becomes violent    Past Medical History, Surgical  history, Social history, and Family History were reviewed and updated.  Review of Systems: As above  Physical Exam:  height is 5\' 7"  (1.702 m) and weight is 202 lb (91.6 kg). Her oral temperature is 98.3 F (36.8 C). Her blood pressure is 98/69 and her pulse is 98. Her respiration is 20.   Well-developed and well-nourished white female. Head and exam shows no ocular or oral lesions. She's able to move both eyes well. She has good extraocular muscle . There  is no erythema or exudate from the left eye. There might be some slight fullness in the right axilla by do not detect any obvious adenopathy. I cannot detect any obvious right supraclavicular lymph nodes. She has improved range of motion of the right shoulder. Left supraclavicular region is okay. No masses are noted. Lungs are clear. Cardiac exam regular rate and rhythm with no murmurs, rubs or bruits.. Abdomen is soft. She has good bowel sounds. There is no palpable liver or spleen tip. Back exam shows decreased tenderness over the lumbar and thoracic spine to palpation. Extremities shows no clubbing, cyanosis or edema. She has 4/5 strength in her legs. Skin exam shows improvement in the radiation dermatitis of the right breast and axilla. There is no open wound. There is some hyper pigmentation. Neurological exam is nonfocal.    Lab Results  Component Value Date   WBC 5.5 01/11/2017   HGB 12.6 01/11/2017   HCT 38.7 01/11/2017   MCV 92 01/11/2017   PLT 72 (L) 01/11/2017     Chemistry      Component Value Date/Time   NA 145 01/11/2017 0817   NA 138 04/30/2015 1019   K 3.8 01/11/2017 0817   K 3.5 04/30/2015 1019   CL 106 01/11/2017 0817   CO2 29 01/11/2017 0817   CO2 29 04/30/2015 1019   BUN 10 01/11/2017 0817   BUN 9.7 04/30/2015 1019   CREATININE 1.2 01/11/2017 0817   CREATININE 0.8 04/30/2015 1019      Component Value Date/Time   CALCIUM 9.0 01/11/2017 0817   CALCIUM 9.7 04/30/2015 1019   ALKPHOS 219 (H) 01/11/2017 0817    ALKPHOS 257 (H) 04/30/2015 1019   AST 41 (H) 01/11/2017 0817   AST 57 (H) 04/30/2015 1019   ALT 33 01/11/2017 0817   ALT 41 04/30/2015 1019   BILITOT 0.90 01/11/2017 0817   BILITOT 0.60 04/30/2015 1019         Impression and Plan: Bianca Kent is 51 year old white female with triple positive metastatic breast cancer.  I  Am glad that she had a good vacation on July 4. She was up in [place]. She also saw her father along the [place] on Father's Day. She went fishing.  She was somewhat disappointed that we are holding the Zometa. I really cannot give her Zometa given the issues with her tooth. I just think that we are in a situation that we need to hold the Zometa. I told her that this was not going to affect her cancer ovoid maker cancer grow more quickly.  We will get a PET scan on her. I'll set the PET scan up for about one month.  I want her off treatment as long as possible.  For right now, I will get her back in another 6 weeks. We will get the PET scan in 4 weeks.  As always, we had a very good prayer session.  [name], MD 3/22/20182:43 PM

## 2017-05-11 LAB — CANCER ANTIGEN 27.29: CA 27.29: 42.1 U/mL — ABNORMAL HIGH (ref 0.0–38.6)

## 2017-05-21 DIAGNOSIS — J9601 Acute respiratory failure with hypoxia: Secondary | ICD-10-CM | POA: Diagnosis not present

## 2017-05-31 MED FILL — ONDANSETRON HCL 8 MG TABLET: 8 | 20 days supply | Qty: 60 | Fill #2

## 2017-05-31 MED FILL — SERTRALINE HCL 100 MG TAB: 100 | 30 days supply | Qty: 30 | Fill #5

## 2017-05-31 MED FILL — ALPRAZolam 1 MG TABS: 1 | 30 days supply | Qty: 60 | Fill #2

## 2017-05-31 MED FILL — traZODone HCL 100 MG TABS: 100 | 30 days supply | Qty: 60 | Fill #1

## 2017-06-07 ENCOUNTER — Ambulatory Visit (HOSPITAL_COMMUNITY)
Admission: RE | Admit: 2017-06-07 | Discharge: 2017-06-07 | Disposition: A | Discharge status: Home / Self Care | Payer: 59 | Source: Ambulatory Visit | Attending: Hematology & Oncology | Admitting: Hematology & Oncology

## 2017-06-07 DIAGNOSIS — I700 Atherosclerosis of aorta: Secondary | ICD-10-CM | POA: Insufficient documentation

## 2017-06-07 DIAGNOSIS — R161 Splenomegaly, not elsewhere classified: Secondary | ICD-10-CM | POA: Diagnosis not present

## 2017-06-07 DIAGNOSIS — C50912 Malignant neoplasm of unspecified site of left female breast: Secondary | ICD-10-CM | POA: Diagnosis not present

## 2017-06-07 DIAGNOSIS — D508 Other iron deficiency anemias: Secondary | ICD-10-CM | POA: Insufficient documentation

## 2017-06-07 DIAGNOSIS — Z9889 Other specified postprocedural states: Secondary | ICD-10-CM | POA: Insufficient documentation

## 2017-06-07 DIAGNOSIS — C787 Secondary malignant neoplasm of liver and intrahepatic bile duct: Secondary | ICD-10-CM | POA: Diagnosis not present

## 2017-06-07 DIAGNOSIS — R188 Other ascites: Secondary | ICD-10-CM | POA: Diagnosis not present

## 2017-06-07 DIAGNOSIS — C50919 Malignant neoplasm of unspecified site of unspecified female breast: Secondary | ICD-10-CM | POA: Diagnosis not present

## 2017-06-07 LAB — GLUCOSE, CAPILLARY: Glucose-Capillary: 73 mg/dL (ref 65–99)

## 2017-06-07 MED ORDER — FLUDEOXYGLUCOSE F - 18 (FDG) INJECTION
9.5000 | Freq: Once | INTRAVENOUS | Status: AC | PRN
  Administered 2017-06-07: 9.5 via INTRAVENOUS

## 2017-06-11 ENCOUNTER — Other Ambulatory Visit: Payer: Self-pay | Admitting: Family

## 2017-06-11 DIAGNOSIS — R64 Cachexia: Secondary | ICD-10-CM

## 2017-06-11 DIAGNOSIS — C50912 Malignant neoplasm of unspecified site of left female breast: Secondary | ICD-10-CM

## 2017-06-11 DIAGNOSIS — G4701 Insomnia due to medical condition: Secondary | ICD-10-CM

## 2017-06-11 DIAGNOSIS — C50911 Malignant neoplasm of unspecified site of right female breast: Secondary | ICD-10-CM

## 2017-06-11 DIAGNOSIS — R5383 Other fatigue: Secondary | ICD-10-CM

## 2017-06-11 DIAGNOSIS — M5415 Radiculopathy, thoracolumbar region: Secondary | ICD-10-CM | POA: Diagnosis not present

## 2017-06-11 MED ORDER — FENTANYL 100 MCG/HR TD PT72: 100.0000 ug | MEDICATED_PATCH | TRANSDERMAL | 0 refills | Status: DC

## 2017-06-11 MED ORDER — FENTANYL 50 MCG/HR TD PT72: 50.0000 ug | MEDICATED_PATCH | TRANSDERMAL | 0 refills | Status: DC

## 2017-06-11 MED FILL — fentaNYL 100 MCG/HR PT72: 100 | 30 days supply | Qty: 10 | Fill #0

## 2017-06-11 MED FILL — tiZANidine HCL 4 MG TABS: 4 | 4 days supply | Qty: 10 | Fill #0

## 2017-06-11 MED FILL — fentaNYL 50 MCG/HR PT72: 50 | 10 days supply | Qty: 5 | Fill #0

## 2017-06-13 ENCOUNTER — Ambulatory Visit (HOSPITAL_BASED_OUTPATIENT_CLINIC_OR_DEPARTMENT_OTHER): Payer: 59

## 2017-06-13 ENCOUNTER — Other Ambulatory Visit: Payer: Self-pay | Admitting: Family

## 2017-06-13 ENCOUNTER — Other Ambulatory Visit: Payer: Self-pay | Admitting: *Deleted

## 2017-06-13 ENCOUNTER — Ambulatory Visit (HOSPITAL_BASED_OUTPATIENT_CLINIC_OR_DEPARTMENT_OTHER): Payer: 59 | Admitting: Hematology & Oncology

## 2017-06-13 VITALS — BP 125/80 | HR 88 | Temp 98.4°F | Resp 20 | Wt 195.0 lb

## 2017-06-13 DIAGNOSIS — C50919 Malignant neoplasm of unspecified site of unspecified female breast: Secondary | ICD-10-CM

## 2017-06-13 DIAGNOSIS — C50911 Malignant neoplasm of unspecified site of right female breast: Secondary | ICD-10-CM

## 2017-06-13 DIAGNOSIS — G8929 Other chronic pain: Secondary | ICD-10-CM

## 2017-06-13 DIAGNOSIS — M546 Pain in thoracic spine: Secondary | ICD-10-CM

## 2017-06-13 DIAGNOSIS — D508 Other iron deficiency anemias: Secondary | ICD-10-CM

## 2017-06-13 DIAGNOSIS — C7931 Secondary malignant neoplasm of brain: Secondary | ICD-10-CM | POA: Diagnosis not present

## 2017-06-13 DIAGNOSIS — R071 Chest pain on breathing: Secondary | ICD-10-CM

## 2017-06-13 DIAGNOSIS — C50912 Malignant neoplasm of unspecified site of left female breast: Secondary | ICD-10-CM | POA: Diagnosis not present

## 2017-06-13 DIAGNOSIS — C7951 Secondary malignant neoplasm of bone: Secondary | ICD-10-CM

## 2017-06-13 DIAGNOSIS — C787 Secondary malignant neoplasm of liver and intrahepatic bile duct: Secondary | ICD-10-CM

## 2017-06-13 LAB — CMP (CANCER CENTER ONLY)
ALBUMIN: 3.4 g/dL (ref 3.3–5.5)
ALK PHOS: 261 U/L — AB (ref 26–84)
ALT(SGPT): 28 U/L (ref 10–47)
AST: 53 U/L — ABNORMAL HIGH (ref 11–38)
BILIRUBIN TOTAL: 0.9 mg/dL (ref 0.20–1.60)
BUN, Bld: 10 mg/dL (ref 7–22)
CALCIUM: 8.9 mg/dL (ref 8.0–10.3)
CO2: 28 mEq/L (ref 18–33)
Chloride: 109 mEq/L — ABNORMAL HIGH (ref 98–108)
Creat: 0.8 mg/dl (ref 0.6–1.2)
GLUCOSE: 135 mg/dL — AB (ref 73–118)
POTASSIUM: 3.6 meq/L (ref 3.3–4.7)
SODIUM: 142 meq/L (ref 128–145)
TOTAL PROTEIN: 8.1 g/dL (ref 6.4–8.1)

## 2017-06-13 LAB — CBC WITH DIFFERENTIAL (CANCER CENTER ONLY)
BASO#: 0 10*3/uL (ref 0.0–0.2)
BASO%: 0.3 % (ref 0.0–2.0)
EOS ABS: 0.1 10*3/uL (ref 0.0–0.5)
EOS%: 2.2 % (ref 0.0–7.0)
HCT: 32.8 % — ABNORMAL LOW (ref 34.8–46.6)
HGB: 10.5 g/dL — ABNORMAL LOW (ref 11.6–15.9)
LYMPH#: 0.4 10*3/uL — ABNORMAL LOW (ref 0.9–3.3)
LYMPH%: 11.8 % — AB (ref 14.0–48.0)
MCH: 28.9 pg (ref 26.0–34.0)
MCHC: 32 g/dL (ref 32.0–36.0)
MCV: 90 fL (ref 81–101)
MONO#: 0.3 10*3/uL (ref 0.1–0.9)
MONO%: 10 % (ref 0.0–13.0)
NEUT#: 2.4 10*3/uL (ref 1.5–6.5)
NEUT%: 75.7 % (ref 39.6–80.0)
PLATELETS: 82 10*3/uL — AB (ref 145–400)
RBC: 3.63 10*6/uL — AB (ref 3.70–5.32)
RDW: 17.1 % — ABNORMAL HIGH (ref 11.1–15.7)
WBC: 3.2 10*3/uL — AB (ref 3.9–10.0)

## 2017-06-13 LAB — IRON AND TIBC
%SAT: 27 % (ref 21–57)
IRON: 62 ug/dL (ref 41–142)
TIBC: 226 ug/dL — AB (ref 236–444)
UIBC: 164 ug/dL (ref 120–384)

## 2017-06-13 LAB — FERRITIN: FERRITIN: 204 ng/mL (ref 9–269)

## 2017-06-13 MED ORDER — HEPARIN SOD (PORK) LOCK FLUSH 100 UNIT/ML IV SOLN
500.0000 [IU] | Freq: Once | INTRAVENOUS | Status: AC
  Administered 2017-06-13: 500 [IU] via INTRAVENOUS
  Filled 2017-06-13: qty 5

## 2017-06-13 MED ORDER — MIRTAZAPINE 30 MG PO TABS: 30.0000 mg | ORAL_TABLET | Freq: Every day | ORAL | 3 refills | Status: AC

## 2017-06-13 MED ORDER — SODIUM CHLORIDE 0.9% FLUSH
10.0000 mL | INTRAVENOUS | Status: DC | PRN
  Administered 2017-06-13: 10 mL via INTRAVENOUS
  Filled 2017-06-13: qty 10

## 2017-06-13 MED ORDER — FENTANYL 75 MCG/HR TD PT72: 150.0000 ug | MEDICATED_PATCH | TRANSDERMAL | 0 refills | Status: AC

## 2017-06-13 MED FILL — MIRTAZAPINE 30 MG TABLET: 30 | 30 days supply | Qty: 30 | Fill #0

## 2017-06-13 NOTE — Progress Notes (Signed)
Hematology and Oncology Follow Up Visit  Bianca Kent [id number] 1965-10-29 51 y.o. 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 I foresee, she clearly has aggressive disease now. We did do a PET scan on her. This was done on August 16. It showed new hepatic metastases. She had a new subpleural nodule along the right hemithorax. There is new porta hepatis and retroperitoneal lymph nodes. Also noted were new metabolic bone lesions.  She does have chronic pain. His heart assay if this is worsening. We have had to make an adjustment with her fentanyl.  She comes in with her sister. We aren't talking about whether not she would like any treatment. We really have not used chemotherapy on her. As such, I feel chemotherapy definitely would be helpful.  Her sister and she both worried about the side effects. She has wanted quality of life as her primary goal. We have been fighting this and how for 3 years. She has done incredibly well.  She sees be eating okay. Her weight is down a little bit. She has had nausea. She actively has mouth issues.  Her CA 27.29 has been trending upward. In July it was 98.  She sometimes has constipation. Sometimes she has diarrhea.   She was not able to drive because of all of her medications and felt that she does get fatigue.   She's had no leg swelling. There's been no rashes. She's had no fever. 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 She  is on chronic oxygen.  She sleeps quite a bit according to her sister.  Currently, her performance status is ECOG 2. Medications:  Current Outpatient Prescriptions:  .  Abemaciclib (VERZENIO) 200 MG TABS, Take 200 mg by mouth 2 (two) times daily with a meal., Disp: 60 tablet, Rfl: 4 .  albuterol (PROVENTIL HFA;VENTOLIN HFA) 108 (90 BASE) MCG/ACT inhaler, Inhale 2 puffs into the lungs every 4 (four) hours as needed for wheezing or shortness of breath., Disp: 2 Inhaler, Rfl: 6 .  ALPRAZolam (XANAX) 1 MG tablet, Take 1 tablet (1 mg total) by mouth 2 (two) times daily as needed for anxiety., Disp: 60 tablet, Rfl: 2 .  aspirin (ASPIRIN EC) 81 MG EC tablet, Take 81 mg by mouth daily. Swallow whole., Disp: , Rfl:  .  diphenhydrAMINE (SOMINEX) 25 MG tablet, Take 100 mg by mouth at bedtime as needed for sleep. Pt states she is taking up to 200 mg Benadryl at night and still not sleeping, Disp: , Rfl:  .  dronabinol (MARINOL) 10 MG capsule, Take 1 capsule (10 mg total) by mouth 3 (three) times daily before meals., Disp: 90 capsule, Rfl: 0 .  ergocalciferol (VITAMIN D2) 50000 units capsule, Take 1 capsule (50,000 Units total) by mouth 2 (two) times a week., Disp: 8 capsule, Rfl: 6 .  fentaNYL (DURAGESIC - DOSED MCG/HR) 100 MCG/HR, Place 1 patch (100 mcg total) onto the skin every other day., Disp: 15 patch, Rfl: 0 .  HYDROmorphone (DILAUDID) 4 MG tablet, Take 1 tablet (4 mg total) by mouth every 4 (four) hours as needed for severe pain., Disp: 90 tablet, Rfl: 0 .  Lactulose SOLN, Take 2 TBLSP every 6 hours until bowel movement, Disp: 1000 mL, Rfl: 4 .  lidocaine-prilocaine (EMLA) cream, Apply 1 application topically as needed., Disp: 30 g, Rfl: 6 .  loratadine-pseudoephedrine (CLARITIN-D 24-HOUR) 10-240 MG per 24 hr tablet, Take 1 tablet by mouth daily., Disp: , Rfl:  .  methylphenidate (RITALIN) 10 MG tablet, Take 1 tablet (10 mg total) by mouth 2 (two) times daily., Disp: 60 tablet, Rfl: 0 .  mirtazapine  (REMERON) 30 MG tablet, TAKE 1 TABLET (30 MG TOTAL) BY MOUTH AT BEDTIME., Disp: 30 tablet, Rfl: 3 .  Multiple Vitamin (MULTIVITAMIN) tablet, Take 1 tablet by mouth daily., Disp: , Rfl:  .  Nystatin POWD, 1 application by Does not apply route 2 (two) times daily., Disp: 1 Bottle, Rfl: 3 .  ondansetron (ZOFRAN) 8 MG tablet, Take 1 tablet (8 mg total) by mouth every 8 (eight) hours as needed for nausea or vomiting., Disp: 60 tablet, Rfl: 2 .  pantoprazole (PROTONIX) 40 MG tablet, Take 1 tablet (40 mg total) by mouth daily., Disp: 30 tablet, Rfl: 6 .  promethazine (PHENERGAN) 25 MG suppository, Place 1 suppository (25 mg total) rectally every 6 (six) hours as needed for nausea or vomiting., Disp: 60 each, Rfl: 3 .  Sennosides-Docusate Sodium (SENNA-S PO), Take 1 tablet by mouth as needed., Disp: , Rfl:  .  sertraline (ZOLOFT) 100 MG tablet, Take 1 tablet (100 mg total) by mouth daily., Disp: 30 tablet, Rfl: 6 .  traZODone (DESYREL) 100 MG tablet, TAKE 1 TO 2 TABLETS BY MOUTH AS NEEDED FOR SLEEP, Disp: 60 tablet, Rfl: 2 No current facility-administered medications for this visit.   Facility-Administered Medications Ordered in Other Visits:  .  alteplase (CATHFLO ACTIVASE) injection 2 mg, 2 mg, Intracatheter, Once PRN, [name], MD .  HYDROmorphone (DILAUDID) injection 2 mg, 2 mg, Intravenous, Q4H PRN, [name], MD, 2 mg at 04/20/16 1233 .  sodium chloride 0.9 % injection 10 mL, 10 mL, Intravenous, PRN, [name], MD, 10 mL at 01/11/17 1333  Allergies:  Allergies  Allergen Reactions  . Iohexol      Code: RASH, Desc: VERY STRONG FAMILY HX OF ANGIOEDEMA WHEN RECEIVING IV CONTRAST; PT HAS BEEN PREMEDICATED FOR OTHER CONTRASTED STUDIES(IN CATH. LAB)  KR, Onset Date: [id number]   . Prednisone Itching    Capillary beds bust  . Tetanus Toxoids Other (See Comments)    Ran a high fever for 48 hours  . Theophyllines Hives    Mental changes  . Versed [Midazolam] Other (See Comments)     Pt becomes violent    Past Medical History, Surgical history, Social history, and Family History were reviewed and updated in the interim history  Review of Systems: Review of systems is presented in the interim history  Physical Exam:  Fairly well-developed and well-nourished white female. Vital signs show temperature of 98.4. Pulse 88. Blood pressure 125/80. Weight is 195 pounds. Hent exam shows no ocular or oral lesions. She has no mucositis. There is no ulcerations in her mouth. There is no adenopathy in the neck. Lungs are decreased on the right side. Left lung field is clear. Cardiac  exam regular rate and rhythm with no murmurs, rubs or bruits. Abdomen is soft. She has decent bowel sounds. There is no fluid wave. There is no guarding or rebound tenderness. She has no palpable liver or spleen tip. Back exam shows some tenderness over the thoracic and lumbar spine. She has some paravertebral muscle spasms. Extremities shows no clubbing, cyanosis or edema. She has decent range of motion of her joints. She has symmetric muscle strength. Skin exam shows no rashes. Her logical exam shows no focal neurological deficits.   Lab Results  Component Value Date   WBC 5.5 01/11/2017   HGB 12.6 01/11/2017   HCT 38.7 01/11/2017   MCV 92 01/11/2017   PLT 72 (L) 01/11/2017     Chemistry      Component Value Date/Time   NA 145 01/11/2017 0817   NA 138 04/30/2015 1019   K 3.8 01/11/2017 0817   K 3.5 04/30/2015 1019   CL 106 01/11/2017 0817   CO2 29 01/11/2017 0817   CO2 29 04/30/2015 1019   BUN 10 01/11/2017 0817   BUN 9.7 04/30/2015 1019   CREATININE 1.2 01/11/2017 0817   CREATININE 0.8 04/30/2015 1019      Component Value Date/Time   CALCIUM 9.0 01/11/2017 0817   CALCIUM 9.7 04/30/2015 1019   ALKPHOS 219 (H) 01/11/2017 0817   ALKPHOS 257 (H) 04/30/2015 1019   AST 41 (H) 01/11/2017 0817   AST 57 (H) 04/30/2015 1019   ALT 33 01/11/2017 0817   ALT 41 04/30/2015 1019   BILITOT 0.90  01/11/2017 0817   BILITOT 0.60 04/30/2015 1019         Impression and Plan: Ms. Callan is a 51 year old white female. She has metastatic breast cancer. Her tumor is triple positive.  We having fighting this for 3 years. She has done well. Unfortunately, we now or again progressive disease.  I do think that she would be only tolerate systemic chemotherapy. We would have to be careful with the dosing.  I probably would consider carboplatin with gemcitabine. I then she could handle this. I think that she would have a hard time with one of the taxanes.  I spent a good 45 minutes with she and her sister. As always, we had a very good prayer session. I answered all their questions. They understood the situation. She is having a difficult time making a decision. However, she feels that a trial of chemotherapy would be reasonable. She can stop after 1 cycle if necessary.  I talked to them about chemotherapy. I think the carboplatin/Gemzar would be reasonable. I went over the side effects. I gave them some information about the protocol.  I would also give her Avastin. I think this is a reasonable approach with metastatic breast cancer. She has not had Avastin. I don't see a reason why we cannot use Avastin.  Again she understands that she has a disease as not curable. She is was wanted quality of life. We will and have a moist focused on this.  We will try to get started next week.  Again, they however complete understanding of our recommendations. I have known her for 3 years. We have a very good relationship with each other and we truly are family to each other.  I will see her back in a week. As always, we had a very good prayer session.  [name], MD 3/22/20182:43 PM

## 2017-06-14 LAB — CANCER ANTIGEN 27.29: CA 27.29: 77.5 U/mL — ABNORMAL HIGH (ref 0.0–38.6)

## 2017-06-14 MED FILL — fentaNYL 75 MCG/HR PT72: 75 | 30 days supply | Qty: 30 | Fill #0

## 2017-06-15 ENCOUNTER — Telehealth: Payer: Self-pay | Admitting: *Deleted

## 2017-06-15 NOTE — Telephone Encounter (Signed)
Patient's sister wanted to deliver message to Dr [name]  She states that Dr [name] is planning on starting patient on a new medication next week with a side effect of increased bleeding. She wants to make sure that Dr [name] knows that patient has daily nose bleeds which are significant at times. Almost daily after a sinus surgery last year, patient has nosebleeds which soak tissues and at times, she wakes up with bloody pillow cases.   Reviewed the above with Dr [name]. He notes the update but states it doesn't change the plan. Sister aware.

## 2017-06-18 ENCOUNTER — Telehealth: Payer: Self-pay | Admitting: *Deleted

## 2017-06-18 NOTE — Telephone Encounter (Signed)
Patient's sister wants to update the office regarding some symptoms patient is experiencing.  Patient has been more confused over the last few days. She is having conversations which are difficult to follow and nonsensical. She has had several near syncopal episodes when standing from a sitting position. Her weakness has increased and she has decreased coordination. The palliative care RN has a scheduled home visit tomorrow. The patient recently started a new muscle relaxer - sister is unable to state the name - and the palliative care RN requested that they stop it due to these symptoms.   Sister has some concern about possibility of brain mets.  Spoke to Dr [name]. He agrees with stopping new medication to see if any of these symptoms are side effects to the new drug. The patient will be in the office on Thursday for treatment. He will assess the patient's symptoms then to see if further work up is necessary.  Sister is aware of plan.

## 2017-06-19 DIAGNOSIS — M5415 Radiculopathy, thoracolumbar region: Secondary | ICD-10-CM | POA: Diagnosis not present

## 2017-06-19 NOTE — Addendum Note (Signed)
Addended by: Burney Gauze R on: 06/19/2017 01:02 PM   Modules accepted: Orders

## 2017-06-21 ENCOUNTER — Other Ambulatory Visit (HOSPITAL_BASED_OUTPATIENT_CLINIC_OR_DEPARTMENT_OTHER): Payer: 59

## 2017-06-21 ENCOUNTER — Ambulatory Visit (HOSPITAL_BASED_OUTPATIENT_CLINIC_OR_DEPARTMENT_OTHER): Payer: 59 | Admitting: Hematology & Oncology

## 2017-06-21 ENCOUNTER — Other Ambulatory Visit: Payer: Self-pay

## 2017-06-21 ENCOUNTER — Other Ambulatory Visit: Payer: Self-pay | Admitting: *Deleted

## 2017-06-21 ENCOUNTER — Ambulatory Visit (HOSPITAL_BASED_OUTPATIENT_CLINIC_OR_DEPARTMENT_OTHER): Payer: 59

## 2017-06-21 ENCOUNTER — Ambulatory Visit: Payer: 59

## 2017-06-21 VITALS — BP 121/79 | HR 108 | Temp 98.1°F | Resp 22

## 2017-06-21 DIAGNOSIS — C50919 Malignant neoplasm of unspecified site of unspecified female breast: Secondary | ICD-10-CM

## 2017-06-21 DIAGNOSIS — C50911 Malignant neoplasm of unspecified site of right female breast: Secondary | ICD-10-CM

## 2017-06-21 DIAGNOSIS — R5383 Other fatigue: Secondary | ICD-10-CM

## 2017-06-21 DIAGNOSIS — M792 Neuralgia and neuritis, unspecified: Secondary | ICD-10-CM

## 2017-06-21 DIAGNOSIS — R630 Anorexia: Secondary | ICD-10-CM | POA: Diagnosis not present

## 2017-06-21 DIAGNOSIS — C7931 Secondary malignant neoplasm of brain: Secondary | ICD-10-CM

## 2017-06-21 DIAGNOSIS — C7951 Secondary malignant neoplasm of bone: Secondary | ICD-10-CM | POA: Diagnosis not present

## 2017-06-21 DIAGNOSIS — Z5112 Encounter for antineoplastic immunotherapy: Secondary | ICD-10-CM

## 2017-06-21 DIAGNOSIS — Z5111 Encounter for antineoplastic chemotherapy: Secondary | ICD-10-CM | POA: Diagnosis not present

## 2017-06-21 DIAGNOSIS — D508 Other iron deficiency anemias: Secondary | ICD-10-CM | POA: Diagnosis not present

## 2017-06-21 DIAGNOSIS — Z170 Estrogen receptor positive status [ER+]: Secondary | ICD-10-CM | POA: Diagnosis not present

## 2017-06-21 DIAGNOSIS — M810 Age-related osteoporosis without current pathological fracture: Secondary | ICD-10-CM

## 2017-06-21 DIAGNOSIS — K5900 Constipation, unspecified: Secondary | ICD-10-CM | POA: Diagnosis not present

## 2017-06-21 DIAGNOSIS — J9601 Acute respiratory failure with hypoxia: Secondary | ICD-10-CM | POA: Diagnosis not present

## 2017-06-21 DIAGNOSIS — R410 Disorientation, unspecified: Secondary | ICD-10-CM | POA: Diagnosis not present

## 2017-06-21 DIAGNOSIS — Z95828 Presence of other vascular implants and grafts: Secondary | ICD-10-CM

## 2017-06-21 DIAGNOSIS — R109 Unspecified abdominal pain: Secondary | ICD-10-CM | POA: Diagnosis not present

## 2017-06-21 DIAGNOSIS — R64 Cachexia: Secondary | ICD-10-CM

## 2017-06-21 DIAGNOSIS — G4701 Insomnia due to medical condition: Secondary | ICD-10-CM

## 2017-06-21 DIAGNOSIS — C50912 Malignant neoplasm of unspecified site of left female breast: Secondary | ICD-10-CM

## 2017-06-21 DIAGNOSIS — R112 Nausea with vomiting, unspecified: Secondary | ICD-10-CM | POA: Diagnosis not present

## 2017-06-21 LAB — CMP (CANCER CENTER ONLY)
ALT(SGPT): 39 U/L (ref 10–47)
AST: 72 U/L — AB (ref 11–38)
Albumin: 3.6 g/dL (ref 3.3–5.5)
Alkaline Phosphatase: 309 U/L — ABNORMAL HIGH (ref 26–84)
BUN, Bld: 8 mg/dL (ref 7–22)
CHLORIDE: 100 meq/L (ref 98–108)
CO2: 33 meq/L (ref 18–33)
Calcium: 9.1 mg/dL (ref 8.0–10.3)
Creat: 0.9 mg/dl (ref 0.6–1.2)
GLUCOSE: 133 mg/dL — AB (ref 73–118)
POTASSIUM: 3.9 meq/L (ref 3.3–4.7)
Sodium: 141 mEq/L (ref 128–145)
Total Bilirubin: 1.1 mg/dl (ref 0.20–1.60)
Total Protein: 8.3 g/dL — ABNORMAL HIGH (ref 6.4–8.1)

## 2017-06-21 LAB — CBC WITH DIFFERENTIAL (CANCER CENTER ONLY)
BASO#: 0 10*3/uL (ref 0.0–0.2)
BASO%: 0.3 % (ref 0.0–2.0)
EOS%: 2.1 % (ref 0.0–7.0)
Eosinophils Absolute: 0.1 10*3/uL (ref 0.0–0.5)
HEMATOCRIT: 33.5 % — AB (ref 34.8–46.6)
HGB: 10.4 g/dL — ABNORMAL LOW (ref 11.6–15.9)
LYMPH#: 0.5 10*3/uL — AB (ref 0.9–3.3)
LYMPH%: 12.8 % — ABNORMAL LOW (ref 14.0–48.0)
MCH: 28.5 pg (ref 26.0–34.0)
MCHC: 31 g/dL — AB (ref 32.0–36.0)
MCV: 92 fL (ref 81–101)
MONO#: 0.4 10*3/uL (ref 0.1–0.9)
MONO%: 9.5 % (ref 0.0–13.0)
NEUT#: 2.9 10*3/uL (ref 1.5–6.5)
NEUT%: 75.3 % (ref 39.6–80.0)
Platelets: 96 10*3/uL — ABNORMAL LOW (ref 145–400)
RBC: 3.65 10*6/uL — ABNORMAL LOW (ref 3.70–5.32)
RDW: 17.2 % — AB (ref 11.1–15.7)
WBC: 3.9 10*3/uL (ref 3.9–10.0)

## 2017-06-21 LAB — IRON AND TIBC
%SAT: 24 % (ref 21–57)
Iron: 55 ug/dL (ref 41–142)
TIBC: 226 ug/dL — ABNORMAL LOW (ref 236–444)
UIBC: 172 ug/dL (ref 120–384)

## 2017-06-21 LAB — LACTATE DEHYDROGENASE: LDH: 247 U/L — ABNORMAL HIGH (ref 125–245)

## 2017-06-21 LAB — FERRITIN: Ferritin: 157 ng/ml (ref 9–269)

## 2017-06-21 MED ORDER — SODIUM CHLORIDE 0.9% FLUSH
10.0000 mL | INTRAVENOUS | Status: DC | PRN
  Filled 2017-06-21: qty 10

## 2017-06-21 MED ORDER — SODIUM CHLORIDE 0.9 % IV SOLN
10.2000 mg/kg | Freq: Once | INTRAVENOUS | Status: AC
  Administered 2017-06-21: 900 mg via INTRAVENOUS
  Filled 2017-06-21: qty 32

## 2017-06-21 MED ORDER — OXYCODONE HCL 20 MG/ML PO CONC: 10.0000 mg | ORAL | 0 refills | Status: AC | PRN

## 2017-06-21 MED ORDER — SODIUM CHLORIDE 0.9 % IV SOLN: Freq: Once | INTRAVENOUS | Status: AC

## 2017-06-21 MED ORDER — SODIUM CHLORIDE 0.9 % IV SOLN
640.0000 mg/m2 | Freq: Once | INTRAVENOUS | Status: AC
  Administered 2017-06-21: 1330 mg via INTRAVENOUS
  Filled 2017-06-21: qty 10.48

## 2017-06-21 MED ORDER — HYDROMORPHONE HCL 4 MG/ML IJ SOLN
INTRAMUSCULAR | Status: AC
  Filled 2017-06-21: qty 1

## 2017-06-21 MED ORDER — PALONOSETRON HCL INJECTION 0.25 MG/5ML
0.2500 mg | Freq: Once | INTRAVENOUS | Status: AC
  Administered 2017-06-21: 0.25 mg via INTRAVENOUS

## 2017-06-21 MED ORDER — HYDROMORPHONE HCL 4 MG/ML IJ SOLN
4.0000 mg | Freq: Once | INTRAMUSCULAR | Status: AC
  Administered 2017-06-21: 4 mg via INTRAVENOUS

## 2017-06-21 MED ORDER — PALONOSETRON HCL INJECTION 0.25 MG/5ML
INTRAVENOUS | Status: AC
  Filled 2017-06-21: qty 5

## 2017-06-21 MED ORDER — DEXAMETHASONE SODIUM PHOSPHATE 10 MG/ML IJ SOLN
10.0000 mg | Freq: Once | INTRAMUSCULAR | Status: AC
  Administered 2017-06-21: 10 mg via INTRAVENOUS

## 2017-06-21 MED ORDER — PROMETHAZINE HCL 25 MG/ML IJ SOLN
12.5000 mg | Freq: Four times a day (QID) | INTRAMUSCULAR | Status: DC | PRN
  Administered 2017-06-21: 12.5 mg via INTRAVENOUS

## 2017-06-21 MED ORDER — DEXAMETHASONE SODIUM PHOSPHATE 10 MG/ML IJ SOLN
INTRAMUSCULAR | Status: AC
  Filled 2017-06-21: qty 1

## 2017-06-21 MED ORDER — SODIUM CHLORIDE 0.9 % IV SOLN
1000.0000 mL | Freq: Once | INTRAVENOUS | Status: AC
  Administered 2017-06-21: 1000 mL via INTRAVENOUS

## 2017-06-21 MED ORDER — HEPARIN SOD (PORK) LOCK FLUSH 100 UNIT/ML IV SOLN
500.0000 [IU] | Freq: Once | INTRAVENOUS | Status: DC | PRN
  Filled 2017-06-21: qty 5

## 2017-06-21 MED ORDER — KETOROLAC TROMETHAMINE 15 MG/ML IJ SOLN
30.0000 mg | Freq: Once | INTRAMUSCULAR | Status: AC
  Administered 2017-06-21: 30 mg via INTRAVENOUS

## 2017-06-21 MED ORDER — SODIUM CHLORIDE 0.9 % IV SOLN
508.8000 mg | Freq: Once | INTRAVENOUS | Status: AC
  Administered 2017-06-21: 510 mg via INTRAVENOUS
  Filled 2017-06-21: qty 51

## 2017-06-21 MED ORDER — PROMETHAZINE HCL 25 MG/ML IJ SOLN
INTRAMUSCULAR | Status: AC
  Filled 2017-06-21: qty 1

## 2017-06-21 MED ORDER — KETOROLAC TROMETHAMINE 15 MG/ML IJ SOLN
INTRAMUSCULAR | Status: AC
  Filled 2017-06-21: qty 2

## 2017-06-21 MED ORDER — HEPARIN SOD (PORK) LOCK FLUSH 100 UNIT/ML IV SOLN
250.0000 [IU] | Freq: Once | INTRAVENOUS | Status: DC | PRN
  Filled 2017-06-21: qty 5

## 2017-06-21 MED ORDER — LIDOCAINE 5 % EX PTCH: 4.0000 | MEDICATED_PATCH | CUTANEOUS | 4 refills | Status: AC

## 2017-06-21 MED ORDER — NYSTATIN POWD: 1.0000 "application " | Freq: Two times a day (BID) | 3 refills | Status: AC

## 2017-06-21 MED FILL — OXYCODONE HCL 100 MG/5 ML S: 100 | 20 days supply | Qty: 60 | Fill #0

## 2017-06-21 MED FILL — NYSTATIN 100,000 UNIT/GM PO: 100000 | 15 days supply | Qty: 15 | Fill #0

## 2017-06-21 MED FILL — LIDOCAINE PATCH 5%: 5 | 30 days supply | Qty: 120 | Fill #0

## 2017-06-21 NOTE — Progress Notes (Signed)
OK to treat with platelets of 96 per Dr [name]. dph

## 2017-06-21 NOTE — Patient Instructions (Signed)
Implanted Port Home Guide An implanted port is a type of central line that is placed under the skin. Central lines are used to provide IV access when treatment or nutrition needs to be given through a person's veins. Implanted ports are used for long-term IV access. An implanted port may be placed because:  You need IV medicine that would be irritating to the small veins in your hands or arms.  You need long-term IV medicines, such as antibiotics.  You need IV nutrition for a long period.  You need frequent blood draws for lab tests.  You need dialysis.  Implanted ports are usually placed in the chest area, but they can also be placed in the upper arm, the abdomen, or the leg. An implanted port has two main parts:  Reservoir. The reservoir is round and will appear as a small, raised area under your skin. The reservoir is the part where a needle is inserted to give medicines or draw blood.  Catheter. The catheter is a thin, flexible tube that extends from the reservoir. The catheter is placed into a large vein. Medicine that is inserted into the reservoir goes into the catheter and then into the vein.  How will I care for my incision site? Do not get the incision site wet. Bathe or shower as directed by your health care provider. How is my port accessed? Special steps must be taken to access the port:  Before the port is accessed, a numbing cream can be placed on the skin. This helps numb the skin over the port site.  Your health care provider uses a sterile technique to access the port. ? Your health care provider must put on a mask and sterile gloves. ? The skin over your port is cleaned carefully with an antiseptic and allowed to dry. ? The port is gently pinched between sterile gloves, and a needle is inserted into the port.  Only "non-coring" port needles should be used to access the port. Once the port is accessed, a blood return should be checked. This helps ensure that the port  is in the vein and is not clogged.  If your port needs to remain accessed for a constant infusion, a clear (transparent) bandage will be placed over the needle site. The bandage and needle will need to be changed every week, or as directed by your health care provider.  Keep the bandage covering the needle clean and dry. Do not get it wet. Follow your health care provider's instructions on how to take a shower or bath while the port is accessed.  If your port does not need to stay accessed, no bandage is needed over the port.  What is flushing? Flushing helps keep the port from getting clogged. Follow your health care provider's instructions on how and when to flush the port. Ports are usually flushed with saline solution or a medicine called heparin. The need for flushing will depend on how the port is used.  If the port is used for intermittent medicines or blood draws, the port will need to be flushed: ? After medicines have been given. ? After blood has been drawn. ? As part of routine maintenance.  If a constant infusion is running, the port may not need to be flushed.  How long will my port stay implanted? The port can stay in for as long as your health care provider thinks it is needed. When it is time for the port to come out, surgery will be   done to remove it. The procedure is similar to the one performed when the port was put in. When should I seek immediate medical care? When you have an implanted port, you should seek immediate medical care if:  You notice a bad smell coming from the incision site.  You have swelling, redness, or drainage at the incision site.  You have more swelling or pain at the port site or the surrounding area.  You have a fever that is not controlled with medicine.  This information is not intended to replace advice given to you by your health care provider. Make sure you discuss any questions you have with your health care provider. Document  Released: 10/09/2005 Document Revised: 03/16/2016 Document Reviewed: 06/16/2013 Elsevier Interactive Patient Education  2017 Elsevier Inc.  

## 2017-06-21 NOTE — Progress Notes (Signed)
Hematology and Oncology Follow Up Visit  Bianca Kent [id number] [birth year] 51 y.o. 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  JMilfordis back for the start of her chemotherapy. I saw her last couple weeks. She is declining. She still wants to have chemotherapy. As such, we're going to try her on reduced dose carboplatin/gemcitabine.   Her sister, comes with her, is worried about her state of mind. She said that there is some confusion. She does have some history of treated brain metastases. She has a MRI scheduled for October. We'll have to move this up to next week.   She is complaining of pain in the right abdomen. She does have known liver metastases which are new. She is on a fentanyl patch area and this is not helping all that much. She is on 150 g patch.  She is on Dilaudid. She does not want to take Dilaudid. I think wish to change the Dilaudid to OxyFast. This might be a little bit better tolerated.   She's had a decreased appetite. There is no nausea or vomiting. She's had some constipation which is chronic.   There is no bleeding.  She's had some chest discomfort. She does have the right hemidiaphragm.   Her oxygen saturation is 100%. She is on supplemental oxygen from the hemidiaphragm.   Currently, her performance status is ECOG 2.    Medications:  Current Outpatient Prescriptions:  .  Abemaciclib (VERZENIO) 200 MG TABS, Take 200 mg by mouth 2 (two) times daily with a meal., Disp: 60 tablet,  Rfl: 4 .  albuterol (PROVENTIL HFA;VENTOLIN HFA) 108 (90 BASE) MCG/ACT inhaler, Inhale 2 puffs into the lungs every 4 (four) hours as needed for wheezing or shortness of breath., Disp: 2 Inhaler, Rfl: 6 .  ALPRAZolam (XANAX) 1 MG tablet, Take 1 tablet (1 mg total) by mouth 2 (two) times daily as needed for anxiety., Disp: 60 tablet, Rfl: 2 .  aspirin (ASPIRIN EC) 81 MG EC tablet, Take 81 mg by mouth daily. Swallow whole., Disp: , Rfl:  .  diphenhydrAMINE (SOMINEX) 25 MG tablet, Take 100 mg by mouth at bedtime as needed for sleep. Pt states she is taking up to 200 mg Benadryl at night and still not sleeping, Disp: , Rfl:  .  dronabinol (MARINOL) 10 MG capsule, Take 1 capsule (10 mg total) by mouth 3 (three) times daily before meals., Disp: 90 capsule, Rfl: 0 .  ergocalciferol (VITAMIN D2) 50000 units capsule, Take 1 capsule (50,000 Units total) by mouth 2 (two) times a week., Disp: 8 capsule, Rfl: 6 .  fentaNYL (DURAGESIC - DOSED MCG/HR) 100 MCG/HR, Place 1 patch (100 mcg total) onto the skin every other day., Disp: 15 patch, Rfl: 0 .  HYDROmorphone (DILAUDID) 4 MG tablet, Take 1 tablet (4 mg total) by mouth every 4 (four) hours as needed  for severe pain., Disp: 90 tablet, Rfl: 0 .  Lactulose SOLN, Take 2 TBLSP every 6 hours until bowel movement, Disp: 1000 mL, Rfl: 4 .  lidocaine-prilocaine (EMLA) cream, Apply 1 application topically as needed., Disp: 30 g, Rfl: 6 .  loratadine-pseudoephedrine (CLARITIN-D 24-HOUR) 10-240 MG per 24 hr tablet, Take 1 tablet by mouth daily., Disp: , Rfl:  .  methylphenidate (RITALIN) 10 MG tablet, Take 1 tablet (10 mg total) by mouth 2 (two) times daily., Disp: 60 tablet, Rfl: 0 .  mirtazapine (REMERON) 30 MG tablet, TAKE 1 TABLET (30 MG TOTAL) BY MOUTH AT BEDTIME., Disp: 30 tablet, Rfl: 3 .  Multiple Vitamin (MULTIVITAMIN) tablet, Take 1 tablet by mouth daily., Disp: , Rfl:  .  Nystatin POWD, 1 application by Does not apply route 2 (two) times daily., Disp: 1 Bottle,  Rfl: 3 .  ondansetron (ZOFRAN) 8 MG tablet, Take 1 tablet (8 mg total) by mouth every 8 (eight) hours as needed for nausea or vomiting., Disp: 60 tablet, Rfl: 2 .  pantoprazole (PROTONIX) 40 MG tablet, Take 1 tablet (40 mg total) by mouth daily., Disp: 30 tablet, Rfl: 6 .  promethazine (PHENERGAN) 25 MG suppository, Place 1 suppository (25 mg total) rectally every 6 (six) hours as needed for nausea or vomiting., Disp: 60 each, Rfl: 3 .  Sennosides-Docusate Sodium (SENNA-S PO), Take 1 tablet by mouth as needed., Disp: , Rfl:  .  sertraline (ZOLOFT) 100 MG tablet, Take 1 tablet (100 mg total) by mouth daily., Disp: 30 tablet, Rfl: 6 .  traZODone (DESYREL) 100 MG tablet, TAKE 1 TO 2 TABLETS BY MOUTH AS NEEDED FOR SLEEP, Disp: 60 tablet, Rfl: 2 No current facility-administered medications for this visit.   Facility-Administered Medications Ordered in Other Visits:  .  alteplase (CATHFLO ACTIVASE) injection 2 mg, 2 mg, Intracatheter, Once PRN, [name], MD .  HYDROmorphone (DILAUDID) injection 2 mg, 2 mg, Intravenous, Q4H PRN, [name], MD, 2 mg at 04/20/16 1233 .  sodium chloride 0.9 % injection 10 mL, 10 mL, Intravenous, PRN, [name], MD, 10 mL at 01/11/17 1333  Allergies:  Allergies  Allergen Reactions  . Iohexol      Code: RASH, Desc: VERY STRONG FAMILY HX OF ANGIOEDEMA WHEN RECEIVING IV CONTRAST; PT HAS BEEN PREMEDICATED FOR OTHER CONTRASTED STUDIES(IN CATH. LAB)  KR, Onset Date: [id number]   . Prednisone Itching    Capillary beds bust  . Tetanus Toxoids Other (See Comments)    Ran a high fever for 48 hours  . Theophyllines Hives    Mental changes  . Versed [Midazolam] Other (See Comments)    Pt becomes violent    Past Medical History, Surgical history, Social history, and Family History were reviewed and updated in the interim history  Review of Systems: As stated in the interim history  Physical Exam:  Physical Exam  Constitutional: She is oriented to  person, place, and time.  HENT:  Head: Normocephalic and atraumatic.  Mouth/Throat: Oropharynx is clear and moist.  Eyes: Pupils are equal, round, and reactive to light. EOM are normal.  Neck: Normal range of motion.  Cardiovascular: Normal rate, regular rhythm and normal heart sounds.   Pulmonary/Chest: Effort normal and breath sounds normal.  Abdominal: Soft. Bowel sounds are normal.  Musculoskeletal: Normal range of motion. She exhibits no edema, tenderness or deformity.  Lymphadenopathy:    She has no cervical adenopathy.  Neurological: She is alert and oriented to person, place, and time.  Skin:  Skin is warm and dry. No rash noted. No erythema.  Psychiatric: She has a normal mood and affect. Her behavior is normal. Judgment and thought content normal.  Vitals reviewed.     Lab Results  Component Value Date   WBC 5.5 01/11/2017   HGB 12.6 01/11/2017   HCT 38.7 01/11/2017   MCV 92 01/11/2017   PLT 72 (L) 01/11/2017     Chemistry      Component Value Date/Time   NA 145 01/11/2017 0817   NA 138 04/30/2015 1019   K 3.8 01/11/2017 0817   K 3.5 04/30/2015 1019   CL 106 01/11/2017 0817   CO2 29 01/11/2017 0817   CO2 29 04/30/2015 1019   BUN 10 01/11/2017 0817   BUN 9.7 04/30/2015 1019   CREATININE 1.2 01/11/2017 0817   CREATININE 0.8 04/30/2015 1019      Component Value Date/Time   CALCIUM 9.0 01/11/2017 0817   CALCIUM 9.7 04/30/2015 1019   ALKPHOS 219 (H) 01/11/2017 0817   ALKPHOS 257 (H) 04/30/2015 1019   AST 41 (H) 01/11/2017 0817   AST 57 (H) 04/30/2015 1019   ALT 33 01/11/2017 0817   ALT 41 04/30/2015 1019   BILITOT 0.90 01/11/2017 0817   BILITOT 0.60 04/30/2015 1019         Impression and Plan: Ms. Valls is a 51 year old white female. She has metastatic breast cancer. Her tumor is triple positive.  Hopefully, she will tolerate chemotherapy. I know that she does not have the best performance status. However, I think with reduced dose treatment, we  should be able to get a response.  I think the addition of Avastin would also be quite helpful. She really has not had chemotherapy. For that about 3 years we've just use HER2 therapy and hormonal therapy.   I know that she is in a tough situation. I did state that she has this pain. Hopefully, the change in her pain medication will help. She's been on the Dilantin for a year and maybe she has some tolerance developed.   We will see her back next week. She does comes in for gemcitabine.  We had a very good prayer session. I spent about 35 minutes with she and her sister. As always, I reviewed all the labs and recommendations. I answered their questions.   [name], MD 3/22/20182:43 PM

## 2017-06-21 NOTE — Patient Instructions (Signed)
Bevacizumab injection What is this medicine? BEVACIZUMAB (be va SIZ yoo mab) is a monoclonal antibody. It is used to treat many types of cancer. This medicine may be used for other purposes; ask your health care provider or pharmacist if you have questions. COMMON BRAND NAME(S): Avastin What should I tell my health care provider before I take this medicine? They need to know if you have any of these conditions: -diabetes -heart disease -high blood pressure -history of coughing up blood -prior anthracycline chemotherapy (e.g., doxorubicin, daunorubicin, epirubicin) -recent or ongoing radiation therapy -recent or planning to have surgery -stroke -an unusual or allergic reaction to bevacizumab, hamster proteins, mouse proteins, other medicines, foods, dyes, or preservatives -pregnant or trying to get pregnant -breast-feeding How should I use this medicine? This medicine is for infusion into a vein. It is given by a health care professional in a hospital or clinic setting. Talk to your pediatrician regarding the use of this medicine in children. Special care may be needed. Overdosage: If you think you have taken too much of this medicine contact a poison control center or emergency room at once. NOTE: This medicine is only for you. Do not share this medicine with others. What if I miss a dose? It is important not to miss your dose. Call your doctor or health care professional if you are unable to keep an appointment. What may interact with this medicine? Interactions are not expected. This list may not describe all possible interactions. Give your health care provider a list of all the medicines, herbs, non-prescription drugs, or dietary supplements you use. Also tell them if you smoke, drink alcohol, or use illegal drugs. Some items may interact with your medicine. What should I watch for while using this medicine? Your condition will be monitored carefully while you are receiving this  medicine. You will need important blood work and urine testing done while you are taking this medicine. This medicine may increase your risk to bruise or bleed. Call your doctor or health care professional if you notice any unusual bleeding. This medicine should be started at least 28 days following major surgery and the site of the surgery should be totally healed. Check with your doctor before scheduling dental work or surgery while you are receiving this treatment. Talk to your doctor if you have recently had surgery or if you have a wound that has not healed. Do not become pregnant while taking this medicine or for 6 months after stopping it. Women should inform their doctor if they wish to become pregnant or think they might be pregnant. There is a potential for serious side effects to an unborn child. Talk to your health care professional or pharmacist for more information. Do not breast-feed an infant while taking this medicine and for 6 months after the last dose. This medicine has caused ovarian failure in some women. This medicine may interfere with the ability to have a child. You should talk to your doctor or health care professional if you are concerned about your fertility. What side effects may I notice from receiving this medicine? Side effects that you should report to your doctor or health care professional as soon as possible: -allergic reactions like skin rash, itching or hives, swelling of the face, lips, or tongue -chest pain or chest tightness -chills -coughing up blood -high fever -seizures -severe constipation -signs and symptoms of bleeding such as bloody or black, tarry stools; red or dark-brown urine; spitting up blood or brown material that looks   like coffee grounds; red spots on the skin; unusual bruising or bleeding from the eye, gums, or nose -signs and symptoms of a blood clot such as breathing problems; chest pain; severe, sudden headache; pain, swelling, warmth in  the leg -signs and symptoms of a stroke like changes in vision; confusion; trouble speaking or understanding; severe headaches; sudden numbness or weakness of the face, arm or leg; trouble walking; dizziness; loss of balance or coordination -stomach pain -sweating -swelling of legs or ankles -vomiting -weight gain Side effects that usually do not require medical attention (report to your doctor or health care professional if they continue or are bothersome): -back pain -changes in taste -decreased appetite -dry skin -nausea -tiredness This list may not describe all possible side effects. Call your doctor for medical advice about side effects. You may report side effects to FDA at 1-800-FDA-1088. Where should I keep my medicine? This drug is given in a hospital or clinic and will not be stored at home. NOTE: This sheet is a summary. It may not cover all possible information. If you have questions about this medicine, talk to your doctor, pharmacist, or health care provider.  2018 Elsevier/Gold Standard (2016-10-06 14:33:29) Gemcitabine injection What is this medicine? GEMCITABINE (jem SIT a been) is a chemotherapy drug. This medicine is used to treat many types of cancer like breast cancer, lung cancer, pancreatic cancer, and ovarian cancer. This medicine may be used for other purposes; ask your health care provider or pharmacist if you have questions. COMMON BRAND NAME(S): Gemzar What should I tell my health care provider before I take this medicine? They need to know if you have any of these conditions: -blood disorders -infection -kidney disease -liver disease -recent or ongoing radiation therapy -an unusual or allergic reaction to gemcitabine, other chemotherapy, other medicines, foods, dyes, or preservatives -pregnant or trying to get pregnant -breast-feeding How should I use this medicine? This drug is given as an infusion into a vein. It is administered in a hospital or  clinic by a specially trained health care professional. Talk to your pediatrician regarding the use of this medicine in children. Special care may be needed. Overdosage: If you think you have taken too much of this medicine contact a poison control center or emergency room at once. NOTE: This medicine is only for you. Do not share this medicine with others. What if I miss a dose? It is important not to miss your dose. Call your doctor or health care professional if you are unable to keep an appointment. What may interact with this medicine? -medicines to increase blood counts like filgrastim, pegfilgrastim, sargramostim -some other chemotherapy drugs like cisplatin -vaccines Talk to your doctor or health care professional before taking any of these medicines: -acetaminophen -aspirin -ibuprofen -ketoprofen -naproxen This list may not describe all possible interactions. Give your health care provider a list of all the medicines, herbs, non-prescription drugs, or dietary supplements you use. Also tell them if you smoke, drink alcohol, or use illegal drugs. Some items may interact with your medicine. What should I watch for while using this medicine? Visit your doctor for checks on your progress. This drug may make you feel generally unwell. This is not uncommon, as chemotherapy can affect healthy cells as well as cancer cells. Report any side effects. Continue your course of treatment even though you feel ill unless your doctor tells you to stop. In some cases, you may be given additional medicines to help with side effects. Follow all directions  for their use. Call your doctor or health care professional for advice if you get a fever, chills or sore throat, or other symptoms of a cold or flu. Do not treat yourself. This drug decreases your body's ability to fight infections. Try to avoid being around people who are sick. This medicine may increase your risk to bruise or bleed. Call your doctor or  health care professional if you notice any unusual bleeding. Be careful brushing and flossing your teeth or using a toothpick because you may get an infection or bleed more easily. If you have any dental work done, tell your dentist you are receiving this medicine. Avoid taking products that contain aspirin, acetaminophen, ibuprofen, naproxen, or ketoprofen unless instructed by your doctor. These medicines may hide a fever. Women should inform their doctor if they wish to become pregnant or think they might be pregnant. There is a potential for serious side effects to an unborn child. Talk to your health care professional or pharmacist for more information. Do not breast-feed an infant while taking this medicine. What side effects may I notice from receiving this medicine? Side effects that you should report to your doctor or health care professional as soon as possible: -allergic reactions like skin rash, itching or hives, swelling of the face, lips, or tongue -low blood counts - this medicine may decrease the number of white blood cells, red blood cells and platelets. You may be at increased risk for infections and bleeding. -signs of infection - fever or chills, cough, sore throat, pain or difficulty passing urine -signs of decreased platelets or bleeding - bruising, pinpoint red spots on the skin, black, tarry stools, blood in the urine -signs of decreased red blood cells - unusually weak or tired, fainting spells, lightheadedness -breathing problems -chest pain -mouth sores -nausea and vomiting -pain, swelling, redness at site where injected -pain, tingling, numbness in the hands or feet -stomach pain -swelling of ankles, feet, hands -unusual bleeding Side effects that usually do not require medical attention (report to your doctor or health care professional if they continue or are bothersome): -constipation -diarrhea -hair loss -loss of appetite -stomach upset This list may not  describe all possible side effects. Call your doctor for medical advice about side effects. You may report side effects to FDA at 1-800-FDA-1088. Where should I keep my medicine? This drug is given in a hospital or clinic and will not be stored at home. NOTE: This sheet is a summary. It may not cover all possible information. If you have questions about this medicine, talk to your doctor, pharmacist, or health care provider.  2018 Elsevier/Gold Standard (2008-02-18 18:45:54) Carboplatin injection What is this medicine? CARBOPLATIN (KAR boe pla tin) is a chemotherapy drug. It targets fast dividing cells, like cancer cells, and causes these cells to die. This medicine is used to treat ovarian cancer and many other cancers. This medicine may be used for other purposes; ask your health care provider or pharmacist if you have questions. COMMON BRAND NAME(S): Paraplatin What should I tell my health care provider before I take this medicine? They need to know if you have any of these conditions: -blood disorders -hearing problems -kidney disease -recent or ongoing radiation therapy -an unusual or allergic reaction to carboplatin, cisplatin, other chemotherapy, other medicines, foods, dyes, or preservatives -pregnant or trying to get pregnant -breast-feeding How should I use this medicine? This drug is usually given as an infusion into a vein. It is administered in a hospital or clinic  by a specially trained health care professional. Talk to your pediatrician regarding the use of this medicine in children. Special care may be needed. Overdosage: If you think you have taken too much of this medicine contact a poison control center or emergency room at once. NOTE: This medicine is only for you. Do not share this medicine with others. What if I miss a dose? It is important not to miss a dose. Call your doctor or health care professional if you are unable to keep an appointment. What may interact with  this medicine? -medicines for seizures -medicines to increase blood counts like filgrastim, pegfilgrastim, sargramostim -some antibiotics like amikacin, gentamicin, neomycin, streptomycin, tobramycin -vaccines Talk to your doctor or health care professional before taking any of these medicines: -acetaminophen -aspirin -ibuprofen -ketoprofen -naproxen This list may not describe all possible interactions. Give your health care provider a list of all the medicines, herbs, non-prescription drugs, or dietary supplements you use. Also tell them if you smoke, drink alcohol, or use illegal drugs. Some items may interact with your medicine. What should I watch for while using this medicine? Your condition will be monitored carefully while you are receiving this medicine. You will need important blood work done while you are taking this medicine. This drug may make you feel generally unwell. This is not uncommon, as chemotherapy can affect healthy cells as well as cancer cells. Report any side effects. Continue your course of treatment even though you feel ill unless your doctor tells you to stop. In some cases, you may be given additional medicines to help with side effects. Follow all directions for their use. Call your doctor or health care professional for advice if you get a fever, chills or sore throat, or other symptoms of a cold or flu. Do not treat yourself. This drug decreases your body's ability to fight infections. Try to avoid being around people who are sick. This medicine may increase your risk to bruise or bleed. Call your doctor or health care professional if you notice any unusual bleeding. Be careful brushing and flossing your teeth or using a toothpick because you may get an infection or bleed more easily. If you have any dental work done, tell your dentist you are receiving this medicine. Avoid taking products that contain aspirin, acetaminophen, ibuprofen, naproxen, or ketoprofen  unless instructed by your doctor. These medicines may hide a fever. Do not become pregnant while taking this medicine. Women should inform their doctor if they wish to become pregnant or think they might be pregnant. There is a potential for serious side effects to an unborn child. Talk to your health care professional or pharmacist for more information. Do not breast-feed an infant while taking this medicine. What side effects may I notice from receiving this medicine? Side effects that you should report to your doctor or health care professional as soon as possible: -allergic reactions like skin rash, itching or hives, swelling of the face, lips, or tongue -signs of infection - fever or chills, cough, sore throat, pain or difficulty passing urine -signs of decreased platelets or bleeding - bruising, pinpoint red spots on the skin, black, tarry stools, nosebleeds -signs of decreased red blood cells - unusually weak or tired, fainting spells, lightheadedness -breathing problems -changes in hearing -changes in vision -chest pain -high blood pressure -low blood counts - This drug may decrease the number of white blood cells, red blood cells and platelets. You may be at increased risk for infections and bleeding. -nausea and  vomiting -pain, swelling, redness or irritation at the injection site -pain, tingling, numbness in the hands or feet -problems with balance, talking, walking -trouble passing urine or change in the amount of urine Side effects that usually do not require medical attention (report to your doctor or health care professional if they continue or are bothersome): -hair loss -loss of appetite -metallic taste in the mouth or changes in taste This list may not describe all possible side effects. Call your doctor for medical advice about side effects. You may report side effects to FDA at 1-800-FDA-1088. Where should I keep my medicine? This drug is given in a hospital or clinic and  will not be stored at home. NOTE: This sheet is a summary. It may not cover all possible information. If you have questions about this medicine, talk to your doctor, pharmacist, or health care provider.  2018 Elsevier/Gold Standard (2008-01-14 14:38:05)

## 2017-06-22 ENCOUNTER — Telehealth: Payer: Self-pay | Admitting: *Deleted

## 2017-06-22 LAB — CANCER ANTIGEN 27.29: CA 27.29: 92.4 U/mL — ABNORMAL HIGH (ref 0.0–38.6)

## 2017-06-22 NOTE — Progress Notes (Signed)
Sample given: Sancuso 3.1 mg patch Lot:  [id number]     Exp:  10/20

## 2017-06-22 NOTE — Telephone Encounter (Signed)
Called patient for chemotherapy follow up call to see how she has done with her Gemzar. PAtient feeling extra tired but other wise no other side effects she has noticed.  Reminded patient to call us if she needs [place].

## 2017-06-26 ENCOUNTER — Encounter: Payer: Self-pay | Admitting: Hematology & Oncology

## 2017-06-27 ENCOUNTER — Other Ambulatory Visit (HOSPITAL_BASED_OUTPATIENT_CLINIC_OR_DEPARTMENT_OTHER): Payer: 59

## 2017-06-27 ENCOUNTER — Ambulatory Visit: Payer: 59

## 2017-06-27 ENCOUNTER — Ambulatory Visit (HOSPITAL_BASED_OUTPATIENT_CLINIC_OR_DEPARTMENT_OTHER): Payer: 59 | Admitting: Hematology & Oncology

## 2017-06-27 ENCOUNTER — Ambulatory Visit (HOSPITAL_BASED_OUTPATIENT_CLINIC_OR_DEPARTMENT_OTHER): Payer: 59

## 2017-06-27 ENCOUNTER — Telehealth: Payer: Self-pay | Admitting: *Deleted

## 2017-06-27 VITALS — BP 126/79 | HR 105 | Temp 98.2°F | Resp 22

## 2017-06-27 DIAGNOSIS — C7931 Secondary malignant neoplasm of brain: Secondary | ICD-10-CM

## 2017-06-27 DIAGNOSIS — Z95828 Presence of other vascular implants and grafts: Secondary | ICD-10-CM

## 2017-06-27 DIAGNOSIS — R7989 Other specified abnormal findings of blood chemistry: Secondary | ICD-10-CM | POA: Diagnosis not present

## 2017-06-27 DIAGNOSIS — D701 Agranulocytosis secondary to cancer chemotherapy: Secondary | ICD-10-CM

## 2017-06-27 DIAGNOSIS — R112 Nausea with vomiting, unspecified: Secondary | ICD-10-CM | POA: Diagnosis not present

## 2017-06-27 DIAGNOSIS — C50912 Malignant neoplasm of unspecified site of left female breast: Secondary | ICD-10-CM

## 2017-06-27 DIAGNOSIS — Z170 Estrogen receptor positive status [ER+]: Secondary | ICD-10-CM | POA: Diagnosis not present

## 2017-06-27 DIAGNOSIS — R1110 Vomiting, unspecified: Secondary | ICD-10-CM

## 2017-06-27 DIAGNOSIS — C50911 Malignant neoplasm of unspecified site of right female breast: Secondary | ICD-10-CM

## 2017-06-27 DIAGNOSIS — C50919 Malignant neoplasm of unspecified site of unspecified female breast: Secondary | ICD-10-CM

## 2017-06-27 DIAGNOSIS — G893 Neoplasm related pain (acute) (chronic): Secondary | ICD-10-CM

## 2017-06-27 DIAGNOSIS — M810 Age-related osteoporosis without current pathological fracture: Secondary | ICD-10-CM | POA: Diagnosis not present

## 2017-06-27 LAB — CMP (CANCER CENTER ONLY)
ALBUMIN: 3.5 g/dL (ref 3.3–5.5)
ALT(SGPT): 105 U/L — ABNORMAL HIGH (ref 10–47)
AST: 157 U/L — ABNORMAL HIGH (ref 11–38)
Alkaline Phosphatase: 417 U/L — ABNORMAL HIGH (ref 26–84)
BUN, Bld: 21 mg/dL (ref 7–22)
CHLORIDE: 96 meq/L — AB (ref 98–108)
CO2: 33 meq/L (ref 18–33)
Calcium: 9.5 mg/dL (ref 8.0–10.3)
Creat: 1.1 mg/dl (ref 0.6–1.2)
Glucose, Bld: 129 mg/dL — ABNORMAL HIGH (ref 73–118)
POTASSIUM: 3.5 meq/L (ref 3.3–4.7)
Sodium: 137 mEq/L (ref 128–145)
TOTAL PROTEIN: 8.7 g/dL — AB (ref 6.4–8.1)
Total Bilirubin: 2 mg/dl — ABNORMAL HIGH (ref 0.20–1.60)

## 2017-06-27 LAB — CBC WITH DIFFERENTIAL (CANCER CENTER ONLY)
BASO#: 0 10*3/uL (ref 0.0–0.2)
BASO%: 0.8 % (ref 0.0–2.0)
EOS%: 0.8 % (ref 0.0–7.0)
Eosinophils Absolute: 0 10*3/uL (ref 0.0–0.5)
HCT: 31.7 % — ABNORMAL LOW (ref 34.8–46.6)
HGB: 10.2 g/dL — ABNORMAL LOW (ref 11.6–15.9)
LYMPH#: 0.1 10*3/uL — ABNORMAL LOW (ref 0.9–3.3)
LYMPH%: 11.7 % — AB (ref 14.0–48.0)
MCH: 28.3 pg (ref 26.0–34.0)
MCHC: 32.2 g/dL (ref 32.0–36.0)
MCV: 88 fL (ref 81–101)
MONO#: 0.1 10*3/uL (ref 0.1–0.9)
MONO%: 5.8 % (ref 0.0–13.0)
NEUT#: 1 10*3/uL — ABNORMAL LOW (ref 1.5–6.5)
NEUT%: 80.9 % — AB (ref 39.6–80.0)
PLATELETS: 43 10*3/uL — AB (ref 145–400)
RBC: 3.6 10*6/uL — AB (ref 3.70–5.32)
RDW: 16.3 % — ABNORMAL HIGH (ref 11.1–15.7)
WBC: 1.2 10*3/uL — AB (ref 3.9–10.0)

## 2017-06-27 MED ORDER — OLANZAPINE 10 MG PO TABS: 10.0000 mg | ORAL_TABLET | Freq: Every day | ORAL | 3 refills | Status: AC

## 2017-06-27 MED ORDER — ONDANSETRON HCL 4 MG/2ML IJ SOLN
INTRAMUSCULAR | Status: AC
  Filled 2017-06-27: qty 4

## 2017-06-27 MED ORDER — HYDROMORPHONE HCL 4 MG/ML IJ SOLN
4.0000 mg | INTRAMUSCULAR | Status: DC | PRN
  Administered 2017-06-27: 4 mg via INTRAVENOUS

## 2017-06-27 MED ORDER — ONDANSETRON HCL 4 MG/2ML IJ SOLN
8.0000 mg | Freq: Once | INTRAMUSCULAR | Status: AC
  Administered 2017-06-27: 8 mg via INTRAVENOUS

## 2017-06-27 MED ORDER — PEGFILGRASTIM INJECTION 6 MG/0.6ML ~~LOC~~
6.0000 mg | PREFILLED_SYRINGE | Freq: Once | SUBCUTANEOUS | Status: AC
  Administered 2017-06-27: 6 mg via SUBCUTANEOUS

## 2017-06-27 MED ORDER — HEPARIN SOD (PORK) LOCK FLUSH 100 UNIT/ML IV SOLN
500.0000 [IU] | Freq: Once | INTRAVENOUS | Status: AC | PRN
  Administered 2017-06-27: 500 [IU] via INTRAVENOUS
  Filled 2017-06-27: qty 5

## 2017-06-27 MED ORDER — HYDROMORPHONE HCL 4 MG/ML IJ SOLN
INTRAMUSCULAR | Status: AC
  Filled 2017-06-27: qty 1

## 2017-06-27 MED ORDER — SODIUM CHLORIDE 0.9 % IV SOLN
1000.0000 mL | Freq: Once | INTRAVENOUS | Status: AC
  Administered 2017-06-27: 1000 mL via INTRAVENOUS

## 2017-06-27 MED ORDER — SODIUM CHLORIDE 0.9 % IJ SOLN
10.0000 mL | INTRAMUSCULAR | Status: DC | PRN
  Administered 2017-06-27: 10 mL via INTRAVENOUS
  Filled 2017-06-27: qty 10

## 2017-06-27 MED ORDER — LORAZEPAM 2 MG/ML IJ SOLN
1.0000 mg | Freq: Once | INTRAMUSCULAR | Status: AC
  Administered 2017-06-27: 1 mg via INTRAVENOUS

## 2017-06-27 MED ORDER — LORAZEPAM 2 MG/ML IJ SOLN
INTRAMUSCULAR | Status: AC
  Filled 2017-06-27: qty 1

## 2017-06-27 MED ORDER — ZOLEDRONIC ACID 4 MG/100ML IV SOLN
4.0000 mg | Freq: Once | INTRAVENOUS | Status: AC
  Administered 2017-06-27: 4 mg via INTRAVENOUS
  Filled 2017-06-27: qty 100

## 2017-06-27 MED ORDER — ONDANSETRON HCL 4 MG/2ML IJ SOLN
8.0000 mg | Freq: Once | INTRAMUSCULAR | Status: AC
Start: 2017-06-27 — End: 2017-06-27
  Administered 2017-06-27: 8 mg via INTRAVENOUS

## 2017-06-27 MED ORDER — HYDROMORPHONE HCL 4 MG/ML IJ SOLN
4.0000 mg | Freq: Once | INTRAMUSCULAR | Status: AC
  Administered 2017-06-27: 4 mg via INTRAVENOUS

## 2017-06-27 MED ORDER — KETOROLAC TROMETHAMINE 15 MG/ML IJ SOLN
30.0000 mg | Freq: Once | INTRAMUSCULAR | Status: AC
  Administered 2017-06-27: 30 mg via INTRAVENOUS

## 2017-06-27 MED ORDER — KETOROLAC TROMETHAMINE 15 MG/ML IJ SOLN
INTRAMUSCULAR | Status: AC
  Filled 2017-06-27: qty 2

## 2017-06-27 MED ORDER — SODIUM CHLORIDE 0.9 % IV SOLN: Freq: Once | INTRAVENOUS | Status: DC

## 2017-06-27 MED FILL — OLANZapine 10 MG TABS: 10 | 30 days supply | Qty: 30 | Fill #0

## 2017-06-27 NOTE — Progress Notes (Signed)
Pt states that pain level has come down to a 10.  Dr. [name] notified and order received to give pt an additional 4 mg of Dilaudid IV now.

## 2017-06-27 NOTE — Progress Notes (Signed)
Pt states increased pain and nausea since last weeks treatment and is requesting to see Dr. [name] today.  Dr. [name] notified and order received to give pt Ativan 1 mg IV now.

## 2017-06-27 NOTE — Telephone Encounter (Signed)
[facility] referral made per Dr. [name] instructions.  Instructed them to supply patient with CADD pump for pain control.  Will do according to hospice.

## 2017-06-28 NOTE — Progress Notes (Signed)
Hematology and Oncology Follow Up Visit  Bianca Kent [id number] 1966-01-09 51 y.o. 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    Interim History:  [name] is back for an unscheduled visit. I suppose that I should not be all that surprised that she has had a very poor tolerance to treatment. It is obvious that she is not going to be able to take any further chemotherapy.  I spoke to she and her sister's afternoon. I explained that we just are not going to be able to give her any more treatment. Her body just is not going to take chemotherapy.  Our goal now is to have comfort and quality of life. I really feel that we have to focus on her quality of life.  She comes in the office. She's been vomiting. She's having a lot of pain.  I think that she really would benefit from hospice. I talked to she and her sister about hospice. Bianca Kent is a [employer]. She understands what hospice is. She agrees to hospice.  I think that as far as her pain is concerned, she may benefit from a CADD pump. She is on a variety of different medications. I just don't know how well they would help her and how well she would absorb these.  Her liver clearly is not working well. Her LFTs are climbing.  She really stays most of the day in bed. I think she seems to be existing right now. I hate this for her. I think that we have to try to get her quality of life a little bit better, if possible.  She understands  that there will be no more treatment.  She's not eating all that much.  I do not think she's having diarrhea.  Currently, her performance status is ECOG 3.  Medications:  Current Outpatient Prescriptions:  .  Abemaciclib (VERZENIO) 200 MG TABS, Take 200 mg by mouth 2 (two) times daily with a meal., Disp: 60 tablet, Rfl: 4 .  albuterol (PROVENTIL HFA;VENTOLIN HFA) 108 (90 BASE) MCG/ACT inhaler, Inhale 2 puffs into the lungs every 4 (four) hours as needed for wheezing or shortness of breath., Disp: 2 Inhaler, Rfl: 6 .  ALPRAZolam (XANAX) 1 MG tablet, Take 1 tablet (1 mg total) by mouth 2 (two) times daily as needed for anxiety., Disp: 60 tablet, Rfl: 2 .  aspirin (ASPIRIN EC) 81 MG EC tablet, Take 81 mg by mouth daily. Swallow whole., Disp: , Rfl:  .  diphenhydrAMINE (SOMINEX) 25 MG tablet, Take 100 mg by mouth at bedtime as needed for sleep. Pt states she is taking up to 200 mg Benadryl at night and still not sleeping, Disp: , Rfl:  .  dronabinol (MARINOL) 10 MG  capsule, Take 1 capsule (10 mg total) by mouth 3 (three) times daily before meals., Disp: 90 capsule, Rfl: 0 .  ergocalciferol (VITAMIN D2) 50000 units capsule, Take 1 capsule (50,000 Units total) by mouth 2 (two) times a week., Disp: 8 capsule, Rfl: 6 .  fentaNYL (DURAGESIC - DOSED MCG/HR) 100 MCG/HR, Place 1 patch (100 mcg total) onto the skin every other day., Disp: 15 patch, Rfl: 0 .  HYDROmorphone (DILAUDID) 4 MG tablet, Take 1 tablet (4 mg total) by mouth every 4 (four) hours as needed for severe pain., Disp: 90 tablet, Rfl: 0 .  Lactulose SOLN, Take 2 TBLSP every 6 hours until bowel movement, Disp: 1000 mL, Rfl: 4 .  lidocaine-prilocaine (EMLA) cream, Apply 1 application topically as needed., Disp: 30 g, Rfl: 6 .  loratadine-pseudoephedrine (CLARITIN-D 24-HOUR) 10-240 MG per 24 hr tablet, Take 1 tablet by mouth daily., Disp: , Rfl:  .  methylphenidate (RITALIN) 10 MG tablet, Take 1 tablet (10 mg total) by mouth 2 (two) times  daily., Disp: 60 tablet, Rfl: 0 .  mirtazapine (REMERON) 30 MG tablet, TAKE 1 TABLET (30 MG TOTAL) BY MOUTH AT BEDTIME., Disp: 30 tablet, Rfl: 3 .  Multiple Vitamin (MULTIVITAMIN) tablet, Take 1 tablet by mouth daily., Disp: , Rfl:  .  Nystatin POWD, 1 application by Does not apply route 2 (two) times daily., Disp: 1 Bottle, Rfl: 3 .  ondansetron (ZOFRAN) 8 MG tablet, Take 1 tablet (8 mg total) by mouth every 8 (eight) hours as needed for nausea or vomiting., Disp: 60 tablet, Rfl: 2 .  pantoprazole (PROTONIX) 40 MG tablet, Take 1 tablet (40 mg total) by mouth daily., Disp: 30 tablet, Rfl: 6 .  promethazine (PHENERGAN) 25 MG suppository, Place 1 suppository (25 mg total) rectally every 6 (six) hours as needed for nausea or vomiting., Disp: 60 each, Rfl: 3 .  Sennosides-Docusate Sodium (SENNA-S PO), Take 1 tablet by mouth as needed., Disp: , Rfl:  .  sertraline (ZOLOFT) 100 MG tablet, Take 1 tablet (100 mg total) by mouth daily., Disp: 30 tablet, Rfl: 6 .  traZODone (DESYREL) 100 MG tablet, TAKE 1 TO 2 TABLETS BY MOUTH AS NEEDED FOR SLEEP, Disp: 60 tablet, Rfl: 2 No current facility-administered medications for this visit.   Facility-Administered Medications Ordered in Other Visits:  .  alteplase (CATHFLO ACTIVASE) injection 2 mg, 2 mg, Intracatheter, Once PRN, [name], MD .  HYDROmorphone (DILAUDID) injection 2 mg, 2 mg, Intravenous, Q4H PRN, [name], MD, 2 mg at 04/20/16 1233 .  sodium chloride 0.9 % injection 10 mL, 10 mL, Intravenous, PRN, [name], MD, 10 mL at 01/11/17 1333  Allergies:  Allergies  Allergen Reactions  . Iohexol      Code: RASH, Desc: VERY STRONG FAMILY HX OF ANGIOEDEMA WHEN RECEIVING IV CONTRAST; PT HAS BEEN PREMEDICATED FOR OTHER CONTRASTED STUDIES(IN CATH. LAB)  KR, Onset Date: [id number]   . Prednisone Itching    Capillary beds bust  . Tetanus Toxoids Other (See Comments)    Ran a high fever for 48 hours  . Theophyllines Hives    Mental changes    . Versed [Midazolam] Other (See Comments)    Pt becomes violent    Past Medical History, Surgical history, Social history, and Family History were reviewed and updated in the interim history  Review of Systems: As stated in the interim history  Physical Exam:  Physical Exam  Constitutional: She is oriented to person, place, and time.  HENT:  Head: Normocephalic and atraumatic.  Mouth/Throat: Oropharynx is clear and moist.  Eyes: Pupils are equal, round, and reactive to light. EOM are normal.  Neck: Normal range of motion.  Cardiovascular: Normal rate, regular rhythm and normal heart sounds.   Pulmonary/Chest: Effort normal and breath sounds normal.  Abdominal: Soft. Bowel sounds are normal.  Musculoskeletal: Normal range of motion. She exhibits no edema, tenderness or deformity.  Lymphadenopathy:    She has no cervical adenopathy.  Neurological: She is alert and oriented to person, place, and time.  Skin: Skin is warm and dry. No rash noted. No erythema.  Psychiatric: She has a normal mood and affect. Her behavior is normal. Judgment and thought content normal.  Vitals reviewed.     Lab Results  Component Value Date   WBC 5.5 01/11/2017   HGB 12.6 01/11/2017   HCT 38.7 01/11/2017   MCV 92 01/11/2017   PLT 72 (L) 01/11/2017     Chemistry      Component Value Date/Time   NA 145 01/11/2017 0817   NA 138 04/30/2015 1019   K 3.8 01/11/2017 0817   K 3.5 04/30/2015 1019   CL 106 01/11/2017 0817   CO2 29 01/11/2017 0817   CO2 29 04/30/2015 1019   BUN 10 01/11/2017 0817   BUN 9.7 04/30/2015 1019   CREATININE 1.2 01/11/2017 0817   CREATININE 0.8 04/30/2015 1019      Component Value Date/Time   CALCIUM 9.0 01/11/2017 0817   CALCIUM 9.7 04/30/2015 1019   ALKPHOS 219 (H) 01/11/2017 0817   ALKPHOS 257 (H) 04/30/2015 1019   AST 41 (H) 01/11/2017 0817   AST 57 (H) 04/30/2015 1019   ALT 33 01/11/2017 0817   ALT 41 04/30/2015 1019   BILITOT 0.90 01/11/2017 0817    BILITOT 0.60 04/30/2015 1019         Impression and Plan: Bianca Kent is a 51 year old white female. She has metastatic breast cancer. Her tumor is triple positive.  Again, she is not a candidate for any further therapy.  Again, our goal is comfort care.  Hospice will come in. I know that they will do a great job in helping her and her family out.  I told she and her sister that I will still be involved with her care. They want to make sure that they do not lose our office. They feel so comfortable with my nursing staff and they really have felt as if they have gotten such personalized care.  I did talk to her about end-of-life issues. She does not want to Live on machines. I agree with this. I don't think that given her alive on a ventilator would help her quality of life. She would just "exist" and she would not want to do this. As such, she is a DO NOT RESUSCITATE.  I just feel bad that we are at this point in her life. I know she has done as well as I would've expected. She has been through about 5 or 6 different lines of therapy. Her body just is not going to tolerate anymore right now.  I then went to give her some Neulasta. Again he was a reduced dose of chemotherapy, her white cell count is dropped quite a bit.  I would like to see her back in a week or so.  I told her sister in private that I do not think that Bianca Kent would make it through the month of September. This really did not surprise  her.  I spent about an hour with Bianca Kent and her sister. We had a very good prayer session. Mr. Mallet is not afraid to pass on. She has an incredible strength within her.   [name], MD 3/22/20182:43 PM

## 2017-06-29 ENCOUNTER — Telehealth: Payer: Self-pay | Admitting: *Deleted

## 2017-06-29 NOTE — Telephone Encounter (Signed)
Patient continues to experience nose bleeds. They are significant in volume, but do stop after a period of time. Recently they've started to increase in frequency when the patient is tearful. [name], the patient's sister is wondering if there is anything that might help this.  Dr [name] would like to patient to try using vaseline to coat the inside of the nostrils. Unfortunately there isn't a better therapy that Dr [name] is aware of.   Sister is aware of suggestion. Hospice is coming in this afternoon so she will also ask them for possible suggestions.

## 2017-07-03 ENCOUNTER — Other Ambulatory Visit: Payer: 59

## 2017-07-03 ENCOUNTER — Ambulatory Visit: Payer: 59

## 2017-07-05 ENCOUNTER — Other Ambulatory Visit: Payer: 59

## 2017-07-05 ENCOUNTER — Ambulatory Visit: Payer: 59 | Admitting: Hematology & Oncology

## 2017-07-05 ENCOUNTER — Ambulatory Visit: Payer: 59

## 2017-07-09 ENCOUNTER — Telehealth: Payer: Self-pay

## 2017-07-09 ENCOUNTER — Other Ambulatory Visit: Payer: Self-pay

## 2017-07-09 MED ORDER — SCOPOLAMINE 1 MG/3DAYS TD PT72: 1.0000 | MEDICATED_PATCH | Freq: Every day | TRANSDERMAL | 3 refills | Status: DC

## 2017-07-09 MED ORDER — SCOPOLAMINE 1 MG/3DAYS TD PT72: 1.0000 | MEDICATED_PATCH | TRANSDERMAL | 3 refills | Status: AC

## 2017-07-09 MED FILL — TRANSDERM-SCOP 1.5 MG/3 DAY: 1 | 8 days supply | Qty: 4 | Fill #0

## 2017-07-12 ENCOUNTER — Other Ambulatory Visit: Payer: 59

## 2017-07-12 ENCOUNTER — Ambulatory Visit: Payer: 59 | Admitting: Hematology & Oncology

## 2017-07-12 ENCOUNTER — Ambulatory Visit: Payer: 59

## 2017-07-19 ENCOUNTER — Other Ambulatory Visit: Payer: 59

## 2017-07-19 ENCOUNTER — Ambulatory Visit: Payer: 59

## 2017-07-19 IMAGING — CR DG CHEST 2V
2 series · 2 of 2 positions shown · non-contrast
Comparison: PET-CT 09/22/2015

CLINICAL DATA: Preop chest exam.  Shortness of breath.

EXAM:
CHEST  2 VIEW

[w chest pa]
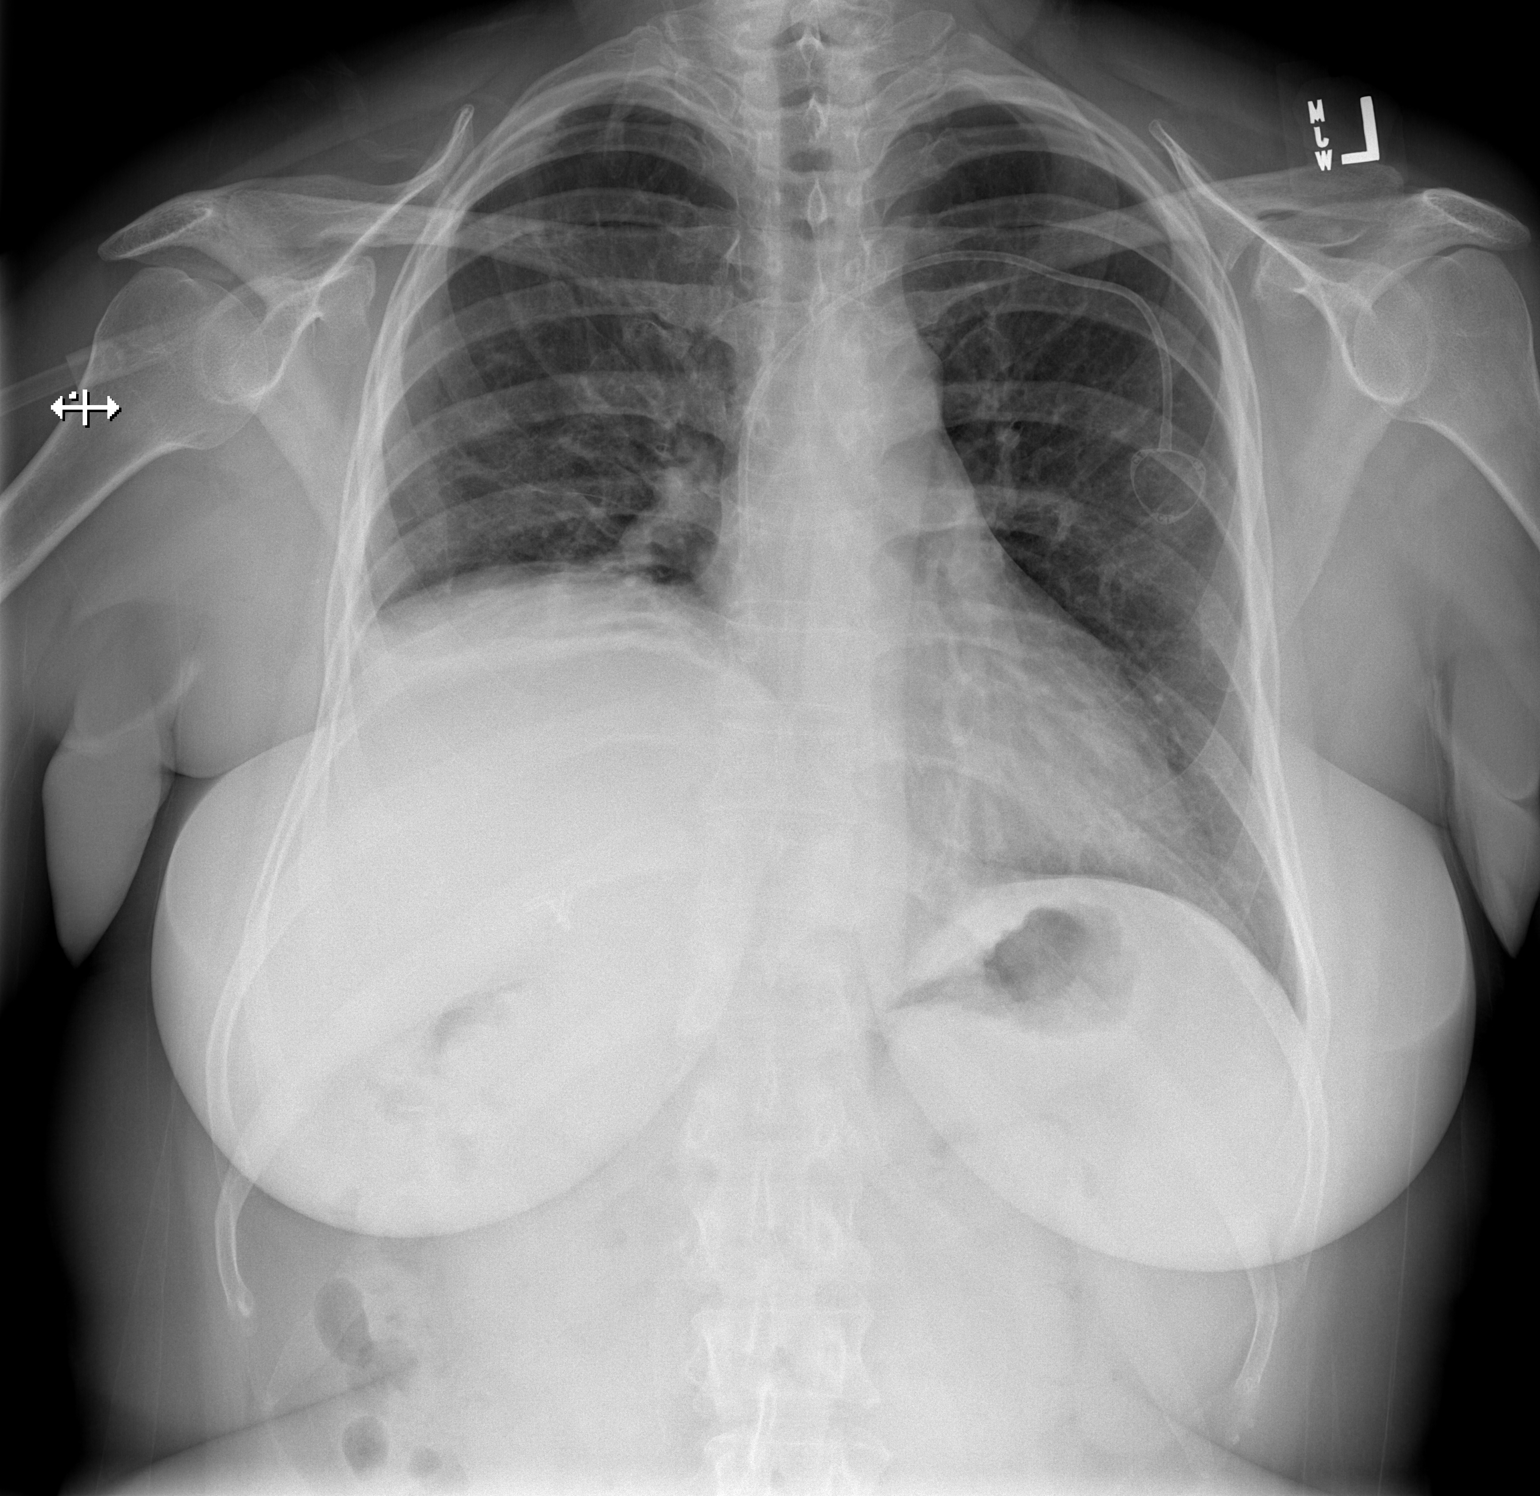

[w chest lat]
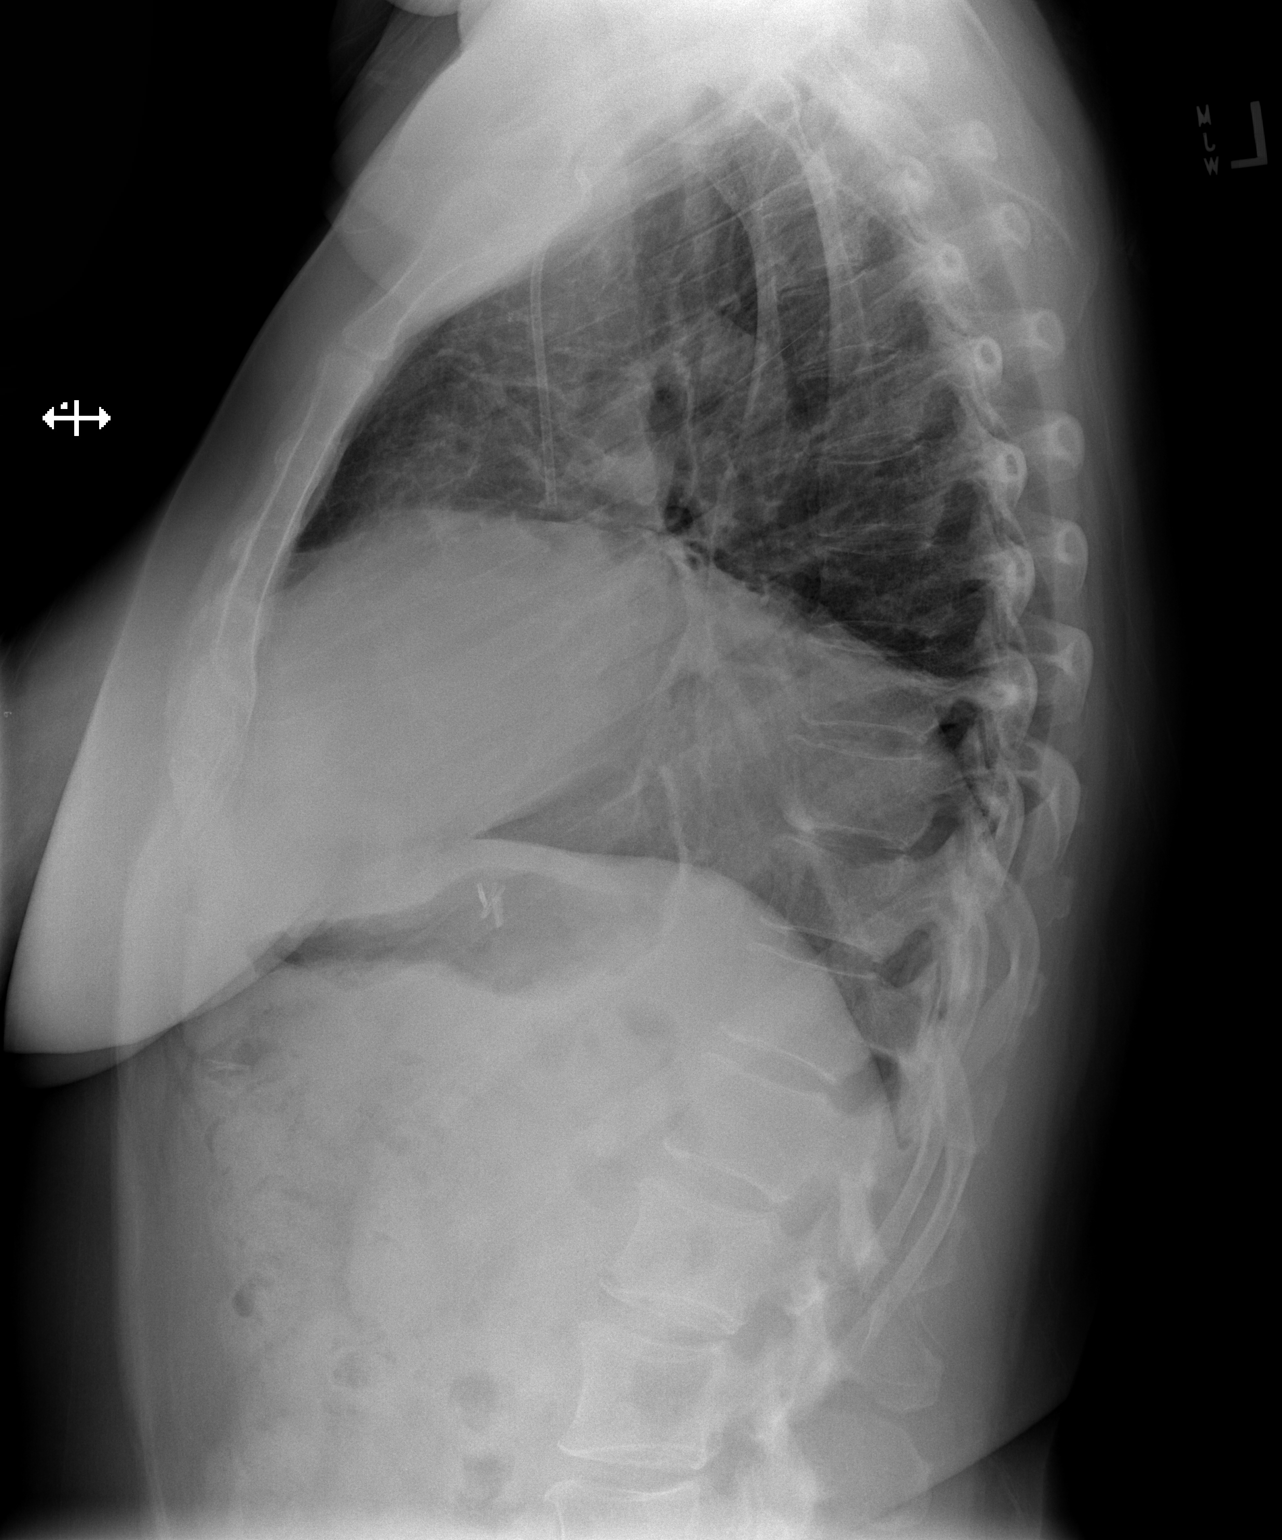

[2 of 2 positions shown; findings below may reference images not displayed]

FINDINGS: There is elevation of the right diaphragm. There is mild right
basilar atelectasis. There is no focal consolidation. There is no
pleural effusion or pneumothorax. The heart and mediastinal contours
are unremarkable. There is a left-sided Port-A-Cath in unchanged
position.

The osseous structures are unremarkable.
IMPRESSION: No active cardiopulmonary disease.

## 2017-07-23 NOTE — Telephone Encounter (Signed)
Bianca Kent with [facility] called to let Dr [name] know pt passed away today at 244 pm with her family.

## 2017-07-23 NOTE — Telephone Encounter (Signed)
Received call from [facility] RN with an update on patient condition. Per Opal Sidles pt is transitioning into the active phase of dying. Dr [name] notified of this change. dph

## 2017-07-23 NOTE — Telephone Encounter (Signed)
Received call from pt's sister [name] with concerns about patient. Per [name], pt appears to be "drowning" and although she is no longer verbal seems to be "crying out for help with her eyes." Per Robin, pt is drooling sanguinous fluid and having nose bleeds. Reports hospice took off her oxygen d/t the canula being blocked by nose bleeds. Stated that one of the Hospice RNs over the weekend wasn't comfortable increasing Morphine, but another was. When Morphine was increased, secretions seemed to be better controlled. Atropine is being utilized as well. Robin questions if Dr [name] has any other ideas to help with managing secretions.   Per Dr [name], Scopolamine patches ordered. Robin aware and agrees. dph

## 2017-07-23 DEATH — deceased

## 2017-08-09 ENCOUNTER — Other Ambulatory Visit: Payer: 59

## 2017-08-09 ENCOUNTER — Ambulatory Visit: Payer: 59

## 2017-08-20 ENCOUNTER — Other Ambulatory Visit: Payer: 59

## 2017-08-30 ENCOUNTER — Ambulatory Visit: Payer: 59

## 2017-08-30 ENCOUNTER — Other Ambulatory Visit: Payer: 59

## 2017-09-09 ENCOUNTER — Encounter: Payer: Self-pay | Admitting: Radiation Therapy

## 2017-09-09 NOTE — Progress Notes (Signed)
   Pt expired 2017-07-15

## 2017-09-26 ENCOUNTER — Other Ambulatory Visit: Payer: Self-pay | Admitting: Nurse Practitioner
